# Patient Record
Sex: Female | Born: 1965
Health system: Southern US, Community
[De-identification: ages and names within clinical notes are randomized; demographics above are authoritative.]

## PROBLEM LIST (undated history)

## (undated) DIAGNOSIS — B20 Human immunodeficiency virus [HIV] disease: Secondary | ICD-10-CM

## (undated) DIAGNOSIS — D61818 Other pancytopenia: Secondary | ICD-10-CM

## (undated) DIAGNOSIS — T300 Burn of unspecified body region, unspecified degree: Secondary | ICD-10-CM

## (undated) DIAGNOSIS — R112 Nausea with vomiting, unspecified: Secondary | ICD-10-CM

## (undated) DIAGNOSIS — C539 Malignant neoplasm of cervix uteri, unspecified: Secondary | ICD-10-CM

## (undated) DIAGNOSIS — Z8614 Personal history of Methicillin resistant Staphylococcus aureus infection: Secondary | ICD-10-CM

## (undated) DIAGNOSIS — Z95828 Presence of other vascular implants and grafts: Secondary | ICD-10-CM

## (undated) DIAGNOSIS — E119 Type 2 diabetes mellitus without complications: Secondary | ICD-10-CM

## (undated) DIAGNOSIS — G893 Neoplasm related pain (acute) (chronic): Secondary | ICD-10-CM

## (undated) DIAGNOSIS — R197 Diarrhea, unspecified: Secondary | ICD-10-CM

## (undated) DIAGNOSIS — T451X5A Adverse effect of antineoplastic and immunosuppressive drugs, initial encounter: Secondary | ICD-10-CM

## (undated) DIAGNOSIS — Z973 Presence of spectacles and contact lenses: Secondary | ICD-10-CM

## (undated) DIAGNOSIS — E039 Hypothyroidism, unspecified: Secondary | ICD-10-CM

## (undated) DIAGNOSIS — Z872 Personal history of diseases of the skin and subcutaneous tissue: Secondary | ICD-10-CM

## (undated) DIAGNOSIS — D696 Thrombocytopenia, unspecified: Secondary | ICD-10-CM

## (undated) DIAGNOSIS — R350 Frequency of micturition: Secondary | ICD-10-CM

## (undated) DIAGNOSIS — E038 Other specified hypothyroidism: Secondary | ICD-10-CM

## (undated) DIAGNOSIS — Z21 Asymptomatic human immunodeficiency virus [HIV] infection status: Secondary | ICD-10-CM

## (undated) DIAGNOSIS — R351 Nocturia: Secondary | ICD-10-CM

## (undated) DIAGNOSIS — Z923 Personal history of irradiation: Secondary | ICD-10-CM

## (undated) DIAGNOSIS — D649 Anemia, unspecified: Secondary | ICD-10-CM

## (undated) HISTORY — PX: TUBAL LIGATION: SHX77

## (undated) HISTORY — PX: OTHER SURGICAL HISTORY: SHX169

## (undated) HISTORY — PX: CYSTOSCOPY: SUR368

---

## 1999-03-26 ENCOUNTER — Emergency Department (HOSPITAL_COMMUNITY): Admission: EM | Admit: 1999-03-26 | Discharge: 1999-03-26 | Payer: Self-pay | Admitting: Emergency Medicine

## 1999-03-26 ENCOUNTER — Encounter: Payer: Self-pay | Admitting: Emergency Medicine

## 2000-09-28 ENCOUNTER — Emergency Department (HOSPITAL_COMMUNITY): Admission: EM | Admit: 2000-09-28 | Discharge: 2000-09-28 | Payer: Self-pay | Admitting: *Deleted

## 2000-11-08 ENCOUNTER — Emergency Department (HOSPITAL_COMMUNITY): Admission: EM | Admit: 2000-11-08 | Discharge: 2000-11-08 | Payer: Self-pay | Admitting: Emergency Medicine

## 2002-06-11 ENCOUNTER — Emergency Department (HOSPITAL_COMMUNITY): Admission: EM | Admit: 2002-06-11 | Discharge: 2002-06-12 | Payer: Self-pay | Admitting: Emergency Medicine

## 2002-07-08 ENCOUNTER — Emergency Department (HOSPITAL_COMMUNITY): Admission: EM | Admit: 2002-07-08 | Discharge: 2002-07-08 | Payer: Self-pay | Admitting: Emergency Medicine

## 2005-01-08 ENCOUNTER — Emergency Department (HOSPITAL_COMMUNITY): Admission: EM | Admit: 2005-01-08 | Discharge: 2005-01-08 | Payer: Self-pay | Admitting: *Deleted

## 2005-03-01 ENCOUNTER — Emergency Department (HOSPITAL_COMMUNITY): Admission: EM | Admit: 2005-03-01 | Discharge: 2005-03-01 | Payer: Self-pay | Admitting: Family Medicine

## 2005-05-30 ENCOUNTER — Ambulatory Visit: Payer: Self-pay | Admitting: Internal Medicine

## 2005-05-31 ENCOUNTER — Ambulatory Visit: Payer: Self-pay | Admitting: *Deleted

## 2005-07-16 ENCOUNTER — Emergency Department (HOSPITAL_COMMUNITY): Admission: EM | Admit: 2005-07-16 | Discharge: 2005-07-16 | Payer: Self-pay | Admitting: Family Medicine

## 2005-11-15 ENCOUNTER — Emergency Department (HOSPITAL_COMMUNITY): Admission: EM | Admit: 2005-11-15 | Discharge: 2005-11-15 | Payer: Self-pay | Admitting: Family Medicine

## 2006-04-24 ENCOUNTER — Emergency Department (HOSPITAL_COMMUNITY): Admission: EM | Admit: 2006-04-24 | Discharge: 2006-04-24 | Payer: Self-pay | Admitting: Family Medicine

## 2006-08-05 ENCOUNTER — Emergency Department (HOSPITAL_COMMUNITY): Admission: EM | Admit: 2006-08-05 | Discharge: 2006-08-05 | Payer: Self-pay | Admitting: Emergency Medicine

## 2006-08-07 ENCOUNTER — Emergency Department (HOSPITAL_COMMUNITY): Admission: EM | Admit: 2006-08-07 | Discharge: 2006-08-07 | Payer: Self-pay | Admitting: Emergency Medicine

## 2008-01-27 ENCOUNTER — Emergency Department (HOSPITAL_COMMUNITY): Admission: EM | Admit: 2008-01-27 | Discharge: 2008-01-28 | Payer: Self-pay | Admitting: Emergency Medicine

## 2009-02-06 ENCOUNTER — Emergency Department (HOSPITAL_COMMUNITY): Admission: EM | Admit: 2009-02-06 | Discharge: 2009-02-07 | Payer: Self-pay | Admitting: Emergency Medicine

## 2009-03-02 ENCOUNTER — Emergency Department (HOSPITAL_COMMUNITY): Admission: EM | Admit: 2009-03-02 | Discharge: 2009-03-02 | Payer: Self-pay | Admitting: Emergency Medicine

## 2010-01-05 ENCOUNTER — Emergency Department (HOSPITAL_COMMUNITY): Admission: EM | Admit: 2010-01-05 | Discharge: 2010-01-05 | Payer: Self-pay | Admitting: Family Medicine

## 2010-01-25 ENCOUNTER — Emergency Department (HOSPITAL_COMMUNITY): Admission: EM | Admit: 2010-01-25 | Discharge: 2010-01-25 | Payer: Self-pay | Admitting: Family Medicine

## 2010-09-27 ENCOUNTER — Emergency Department (HOSPITAL_COMMUNITY): Admission: EM | Admit: 2010-09-27 | Discharge: 2010-09-27 | Payer: Self-pay | Admitting: Emergency Medicine

## 2011-01-18 LAB — POCT URINALYSIS DIPSTICK
Bilirubin Urine: NEGATIVE
Glucose, UA: NEGATIVE mg/dL
Ketones, ur: NEGATIVE mg/dL
Nitrite: NEGATIVE
Protein, ur: NEGATIVE mg/dL
Specific Gravity, Urine: 1.02 (ref 1.005–1.030)
Urobilinogen, UA: 1 mg/dL (ref 0.0–1.0)
pH: 6.5 (ref 5.0–8.0)

## 2011-01-18 LAB — POCT PREGNANCY, URINE: Preg Test, Ur: NEGATIVE

## 2011-02-05 ENCOUNTER — Inpatient Hospital Stay (INDEPENDENT_AMBULATORY_CARE_PROVIDER_SITE_OTHER)
Admission: RE | Admit: 2011-02-05 | Discharge: 2011-02-05 | Disposition: A | Discharge status: Home / Self Care | Payer: Self-pay | Source: Ambulatory Visit | Attending: Emergency Medicine | Admitting: Emergency Medicine

## 2011-02-05 DIAGNOSIS — T6391XA Toxic effect of contact with unspecified venomous animal, accidental (unintentional), initial encounter: Secondary | ICD-10-CM

## 2011-02-16 LAB — URINE MICROSCOPIC-ADD ON

## 2011-02-16 LAB — URINALYSIS, ROUTINE W REFLEX MICROSCOPIC
Bilirubin Urine: NEGATIVE
Glucose, UA: NEGATIVE mg/dL
Ketones, ur: NEGATIVE mg/dL
Nitrite: POSITIVE — AB
Protein, ur: 100 mg/dL — AB
Specific Gravity, Urine: 1.018 (ref 1.005–1.030)
Urobilinogen, UA: 1 mg/dL (ref 0.0–1.0)
pH: 6.5 (ref 5.0–8.0)

## 2011-02-16 LAB — POCT PREGNANCY, URINE: Preg Test, Ur: NEGATIVE

## 2011-03-14 ENCOUNTER — Inpatient Hospital Stay (INDEPENDENT_AMBULATORY_CARE_PROVIDER_SITE_OTHER)
Admission: RE | Admit: 2011-03-14 | Discharge: 2011-03-14 | Disposition: A | Discharge status: Home / Self Care | Payer: Self-pay | Source: Ambulatory Visit | Attending: Emergency Medicine | Admitting: Emergency Medicine

## 2011-03-14 DIAGNOSIS — L02419 Cutaneous abscess of limb, unspecified: Secondary | ICD-10-CM

## 2011-03-16 LAB — CULTURE, ROUTINE-ABSCESS: Gram Stain: NONE SEEN

## 2011-08-01 LAB — URINALYSIS, ROUTINE W REFLEX MICROSCOPIC
Bilirubin Urine: NEGATIVE
Glucose, UA: NEGATIVE
Hgb urine dipstick: NEGATIVE
Ketones, ur: 15 — AB
Leukocytes, UA: NEGATIVE
Protein, ur: 30 — AB
pH: 6

## 2011-08-01 LAB — GC/CHLAMYDIA PROBE AMP, GENITAL: GC Probe Amp, Genital: NEGATIVE

## 2011-08-01 LAB — WET PREP, GENITAL: Yeast Wet Prep HPF POC: NONE SEEN

## 2011-08-01 LAB — POCT PREGNANCY, URINE: Preg Test, Ur: NEGATIVE

## 2011-08-01 LAB — URINE MICROSCOPIC-ADD ON

## 2012-04-11 ENCOUNTER — Emergency Department (INDEPENDENT_AMBULATORY_CARE_PROVIDER_SITE_OTHER)
Admission: EM | Admit: 2012-04-11 | Discharge: 2012-04-11 | Disposition: A | Discharge status: Home / Self Care | Payer: Self-pay | Source: Home / Self Care | Attending: Emergency Medicine | Admitting: Emergency Medicine

## 2012-04-11 ENCOUNTER — Encounter (HOSPITAL_COMMUNITY): Payer: Self-pay | Admitting: Emergency Medicine

## 2012-04-11 DIAGNOSIS — N898 Other specified noninflammatory disorders of vagina: Secondary | ICD-10-CM

## 2012-04-11 DIAGNOSIS — J329 Chronic sinusitis, unspecified: Secondary | ICD-10-CM

## 2012-04-11 LAB — POCT URINALYSIS DIP (DEVICE)
Ketones, ur: 15 mg/dL — AB
Specific Gravity, Urine: >=1.03 (ref 1.005–1.030)
pH: 5.5 (ref 5.0–8.0)

## 2012-04-11 LAB — WET PREP, GENITAL: Trich, Wet Prep: NONE SEEN

## 2012-04-11 LAB — POCT PREGNANCY, URINE: Preg Test, Ur: NEGATIVE

## 2012-04-11 MED ORDER — METRONIDAZOLE 500 MG PO TABS: 500.0000 mg | ORAL_TABLET | Freq: Two times a day (BID) | ORAL | Status: AC

## 2012-04-11 MED ORDER — FLUCONAZOLE 200 MG PO TABS: 200.0000 mg | ORAL_TABLET | Freq: Once | ORAL | Status: AC

## 2012-04-11 NOTE — ED Provider Notes (Signed)
History     CSN: [id number]  Arrival date & time 04/11/12  1554   First MD Initiated Contact with Patient 04/11/12 1609      Chief Complaint  Patient presents with  . Vaginal Discharge  . Sinusitis    (Consider location/radiation/quality/duration/timing/severity/associated sxs/prior treatment) HPI Comments: Presents hasn't been having some discomfort in her lower abdomen with quite abundant discharge with an odor this started about a week ago. Patient denies any genitalia rashes or blisters. Patient denies any fevers, and denies any urinary symptoms.  Patient describes that for about a month she's been having sinus congestion sore throat and discomfort with a runny and congested nose. She's been using Claritin and A. over-the-counter nasal spray.  Patient is a 46 y.o. female presenting with vaginal discharge and sinusitis. The history is provided by the patient.  Vaginal Discharge This is a new problem. The current episode started more than 1 week ago. The problem occurs constantly. Pertinent negatives include no abdominal pain, no headaches and no shortness of breath.  Sinusitis  Associated symptoms include congestion and sinus pressure. Pertinent negatives include no chills and no shortness of breath.    Past Medical History  Diagnosis Date  . Sinusitis   . MRSA infection     History reviewed. No pertinent past surgical history.  No family history on file.  History  Substance Use Topics  . Smoking status: Never Smoker   . Smokeless tobacco: Not on file  . Alcohol Use: No    OB History    Grav Para Term Preterm Abortions TAB SAB Ect Mult Living                  Review of Systems  Constitutional: Negative for chills, diaphoresis, activity change and appetite change.  HENT: Positive for congestion, rhinorrhea, postnasal drip and sinus pressure. Negative for facial swelling, neck pain and neck stiffness.   Eyes: Negative for photophobia, discharge and redness.    Respiratory: Negative for shortness of breath.   Gastrointestinal: Negative for abdominal pain.  Genitourinary: Positive for vaginal discharge. Negative for dysuria, flank pain and vaginal pain.  Skin: Negative for rash.  Neurological: Negative for headaches.    Allergies  Review of patient's allergies indicates no known allergies.  Home Medications   Current Outpatient Rx  Name Route Sig Dispense Refill  . LORATADINE 10 MG PO TABS Oral Take 10 mg by mouth daily.    Marland Kitchen FLUCONAZOLE 200 MG PO TABS Oral Take 1 tablet (200 mg total) by mouth once. 1 tablet 0  . METRONIDAZOLE 500 MG PO TABS Oral Take 1 tablet (500 mg total) by mouth 2 (two) times daily. 14 tablet 0    BP 128/65  Pulse 78  Temp(Src) 98.6 F (37 C) (Oral)  Resp 16  SpO2 100%  LMP 03/28/2012  Physical Exam  Nursing note and vitals reviewed. Constitutional: She appears well-developed and well-nourished.  HENT:  Head: Normocephalic.  Right Ear: Tympanic membrane normal.  Left Ear: Tympanic membrane normal.  Mouth/Throat: Uvula is midline and oropharynx is clear and moist.  Eyes: Conjunctivae are normal.  Neck: Neck supple. No JVD present.  Cardiovascular: Normal rate.   Pulmonary/Chest: Effort normal.  Abdominal: She exhibits no distension and no mass. There is no tenderness. There is no rebound and no guarding.  Genitourinary: Vaginal discharge found.  Musculoskeletal: Normal range of motion.  Lymphadenopathy:    She has no cervical adenopathy.  Neurological: She is alert.    ED Course  Procedures (including critical care time)  Labs Reviewed  POCT URINALYSIS DIP (DEVICE) - Abnormal; Notable for the following:    Bilirubin Urine SMALL (*)    Ketones, ur 15 (*)    Hgb urine dipstick MODERATE (*)    Protein, ur 100 (*)    Leukocytes, UA SMALL (*) Biochemical Testing Only. Please order routine urinalysis from main lab if confirmatory testing is needed.   All other components within normal limits  WET  PREP, GENITAL - Abnormal; Notable for the following:    Yeast Wet Prep HPF POC FEW (*)    Clue Cells Wet Prep HPF POC FEW (*)    WBC, Wet Prep HPF POC MANY (*)    All other components within normal limits  POCT PREGNANCY, URINE  GC/CHLAMYDIA PROBE AMP, GENITAL   No results found.   1. Sinusitis   2. Vaginal Discharge       MDM  Patient presents to urgent care with 2 complaints ongoing sinusitis -type symptoms and a vaginal discharge. Her a. Patient was examined screened for STDs. Her to continue with Claritin-D instead of plain Claritin and prescribed Flagyl and Diflucan. Patient agree with for empirical treatment prior wet prep results.      Jimmie Molly, MD 04/11/12 [phone]

## 2012-04-11 NOTE — Discharge Instructions (Signed)
  We will contact you if abnormal test results will require of further treatment   Sinusitis Sinuses are air pockets within the bones of your face. The growth of bacteria within a sinus leads to infection. The infection prevents the sinuses from draining. This infection is called sinusitis. SYMPTOMS  There will be different areas of pain depending on which sinuses have become infected.  The maxillary sinuses often produce pain beneath the eyes.   Frontal sinusitis may cause pain in the middle of the forehead and above the eyes.  Other problems (symptoms) include:  Toothaches.   Colored, pus-like (purulent) drainage from the nose.   Swelling, warmth, and tenderness over the sinus areas may be signs of infection.  TREATMENT  Sinusitis is most often determined by an exam.X-rays may be taken. If x-rays have been taken, make sure you obtain your results or find out how you are to obtain them. Your caregiver may give you medications (antibiotics). These are medications that will help kill the bacteria causing the infection. You may also be given a medication (decongestant) that helps to reduce sinus swelling.  HOME CARE INSTRUCTIONS   Only take over-the-counter or prescription medicines for pain, discomfort, or fever as directed by your caregiver.   Drink extra fluids. Fluids help thin the mucus so your sinuses can drain more easily.   Applying either moist heat or ice packs to the sinus areas may help relieve discomfort.   Use saline nasal sprays to help moisten your sinuses. The sprays can be found at your local drugstore.  SEEK IMMEDIATE MEDICAL CARE IF:  You have a fever.   You have increasing pain, severe headaches, or toothache.   You have nausea, vomiting, or drowsiness.   You develop unusual swelling around the face or trouble seeing.  MAKE SURE YOU:   Understand these instructions.   Will watch your condition.   Will get help right away if you are not doing well or get  worse.  Document Released: 10/24/2005 Document Revised: 10/13/2011 Document Reviewed: 05/23/2007 [facility] Patient Information 2012 [place], [place].

## 2012-04-11 NOTE — ED Notes (Signed)
Pt here with c/o lower abdominal pain and white,clumpy vag d/c with odor that started x 1 week ago and sinus infection ongoing x 1 mnth.cough with white phlegm,sore throat,throb h/a and stuffy nose unrelieved by otc Claritin d and nasal spray

## 2012-04-12 LAB — GC/CHLAMYDIA PROBE AMP, GENITAL: Chlamydia, DNA Probe: NEGATIVE

## 2012-04-12 NOTE — ED Notes (Signed)
GC/Chlamydia neg., Wet prep: few yeast, few clue cells, many WBC's. Pt. adequately treated with Diflucan and Flagyl. Vassie Moselle 04/12/2012

## 2012-12-06 ENCOUNTER — Encounter (HOSPITAL_COMMUNITY): Payer: Self-pay | Admitting: Neurology

## 2012-12-06 ENCOUNTER — Emergency Department (HOSPITAL_COMMUNITY)
Admission: EM | Admit: 2012-12-06 | Discharge: 2012-12-06 | Disposition: A | Discharge status: Home / Self Care | Payer: Self-pay | Attending: Emergency Medicine | Admitting: Emergency Medicine

## 2012-12-06 DIAGNOSIS — Z8614 Personal history of Methicillin resistant Staphylococcus aureus infection: Secondary | ICD-10-CM | POA: Insufficient documentation

## 2012-12-06 DIAGNOSIS — Z8709 Personal history of other diseases of the respiratory system: Secondary | ICD-10-CM | POA: Insufficient documentation

## 2012-12-06 DIAGNOSIS — N390 Urinary tract infection, site not specified: Secondary | ICD-10-CM

## 2012-12-06 LAB — POCT I-STAT, CHEM 8
BUN: 10 mg/dL (ref 6–23)
Calcium, Ion: 1.15 mmol/L (ref 1.12–1.23)
Chloride: 104 mEq/L (ref 96–112)
Creatinine, Ser: 0.9 mg/dL (ref 0.50–1.10)
Glucose, Bld: 104 mg/dL — ABNORMAL HIGH (ref 70–99)

## 2012-12-06 LAB — URINALYSIS, ROUTINE W REFLEX MICROSCOPIC
Bilirubin Urine: NEGATIVE
Glucose, UA: NEGATIVE mg/dL
Ketones, ur: NEGATIVE mg/dL
Protein, ur: >300 mg/dL — AB
pH: 6.5 (ref 5.0–8.0)

## 2012-12-06 LAB — WET PREP, GENITAL: WBC, Wet Prep HPF POC: NONE SEEN

## 2012-12-06 LAB — URINE MICROSCOPIC-ADD ON

## 2012-12-06 MED ORDER — CEPHALEXIN 500 MG PO CAPS: 500.0000 mg | ORAL_CAPSULE | Freq: Four times a day (QID) | ORAL | Status: DC

## 2012-12-06 NOTE — ED Notes (Signed)
Pt states hematuria since this morning with clots of blood. Denies pain. Pt ambulatory in NAD

## 2012-12-06 NOTE — ED Provider Notes (Signed)
Medical screening examination/treatment/procedure(s) were performed by non-physician practitioner and as supervising physician I was immediately available for consultation/collaboration.   Chey Rachels, MD 12/06/12 1552 

## 2012-12-06 NOTE — ED Provider Notes (Signed)
History     CSN: [id number]  Arrival date & time 12/06/12  0711   First MD Initiated Contact with Patient 12/06/12 [phone]      Chief Complaint  Patient presents with  . Hematuria    (Consider location/radiation/quality/duration/timing/severity/associated sxs/prior treatment) HPI Comments: This is a 47 year old female, who presents emergency department with chief complaint of hematuria. Patient states that her symptoms began this morning. She denies any dysuria, urgency, or frequency. She states her last menstrual period was 11/21/2012. She denies fever, nausea, vomiting, and abdominal pain. She has not tried anything to alleviate her symptoms. She has not experienced this in the past. Nothing makes her symptoms better or worse.  The history is provided by the patient. No language interpreter was used.    Past Medical History  Diagnosis Date  . Sinusitis   . MRSA infection     History reviewed. No pertinent past surgical history.  No family history on file.  History  Substance Use Topics  . Smoking status: Never Smoker   . Smokeless tobacco: Not on file  . Alcohol Use: No    OB History    Grav Para Term Preterm Abortions TAB SAB Ect Mult Living                  Review of Systems  All other systems reviewed and are negative.    Allergies  Review of patient's allergies indicates no known allergies.  Home Medications   Current Outpatient Rx  Name  Route  Sig  Dispense  Refill  . OVER THE COUNTER MEDICATION   Nasal   Place 1 spray into the nose 2 (two) times daily as needed. Nasal spray         . LORATADINE 10 MG PO TABS   Oral   Take 10 mg by mouth daily as needed. Seasonal allergies         . OXYCODONE-ACETAMINOPHEN 5-325 MG PO TABS   Oral   Take 1 tablet by mouth every 6 (six) hours as needed. pain           BP 135/94  Pulse 86  Temp 97.4 F (36.3 C) (Oral)  Resp 18  Ht 5\' 1"  (1.549 m)  Wt 200 lb (90.719 kg)  BMI 37.79 kg/m2  SpO2 99%   LMP 11/21/2012  Physical Exam  Nursing note and vitals reviewed. Constitutional: She is oriented to person, place, and time. She appears well-developed and well-nourished.  HENT:  Head: Normocephalic and atraumatic.  Eyes: Conjunctivae normal and EOM are normal. Pupils are equal, round, and reactive to light.  Neck: Normal range of motion. Neck supple.  Cardiovascular: Normal rate and regular rhythm.  Exam reveals no gallop and no friction rub.   No murmur heard. Pulmonary/Chest: Effort normal and breath sounds normal. No respiratory distress. She has no wheezes. She has no rales. She exhibits no tenderness.  Abdominal: Soft. Bowel sounds are normal. She exhibits no distension and no mass. There is no tenderness. There is no rebound and no guarding. Hernia confirmed negative in the right inguinal area and confirmed negative in the left inguinal area.  Genitourinary: No labial fusion. There is no rash, tenderness, lesion or injury on the right labia. There is no rash, tenderness, lesion or injury on the left labia. Uterus is not deviated, not enlarged, not fixed and not tender. Cervix exhibits no motion tenderness, no discharge and no friability. Right adnexum displays no mass, no tenderness and no fullness. Left  adnexum displays no mass, no tenderness and no fullness. No erythema, tenderness or bleeding around the vagina. No foreign body around the vagina. No signs of injury around the vagina. No vaginal discharge found.  Musculoskeletal: Normal range of motion. She exhibits no edema and no tenderness.  Lymphadenopathy:       Right: No inguinal adenopathy present.       Left: No inguinal adenopathy present.  Neurological: She is alert and oriented to person, place, and time.  Skin: Skin is warm and dry.  Psychiatric: She has a normal mood and affect. Her behavior is normal. Judgment and thought content normal.    ED Course  Procedures (including critical care time)   Labs Reviewed   URINALYSIS, ROUTINE W REFLEX MICROSCOPIC  WET PREP, GENITAL  GC/CHLAMYDIA PROBE AMP   Results for orders placed during the hospital encounter of 12/06/12  URINALYSIS, ROUTINE W REFLEX MICROSCOPIC      Component Value Range   Color, Urine RED (*) YELLOW   APPearance TURBID (*) CLEAR   Specific Gravity, Urine 1.024  1.005 - 1.030   pH 6.5  5.0 - 8.0   Glucose, UA NEGATIVE  NEGATIVE mg/dL   Hgb urine dipstick LARGE (*) NEGATIVE   Bilirubin Urine NEGATIVE  NEGATIVE   Ketones, ur NEGATIVE  NEGATIVE mg/dL   Protein, ur >161 (*) NEGATIVE mg/dL   Urobilinogen, UA 1.0  0.0 - 1.0 mg/dL   Nitrite NEGATIVE  NEGATIVE   Leukocytes, UA SMALL (*) NEGATIVE  WET PREP, GENITAL      Component Value Range   Yeast Wet Prep HPF POC NONE SEEN  NONE SEEN   Trich, Wet Prep NONE SEEN  NONE SEEN   Clue Cells Wet Prep HPF POC FEW (*) NONE SEEN   WBC, Wet Prep HPF POC NONE SEEN  NONE SEEN  URINE MICROSCOPIC-ADD ON      Component Value Range   Squamous Epithelial / LPF FEW (*) RARE   WBC, UA 21-50  <3 WBC/hpf   RBC / HPF TOO NUMEROUS TO COUNT  <3 RBC/hpf  HEMOGLOBIN      Component Value Range   Hemoglobin 12.0  12.0 - 15.0 g/dL  POCT I-STAT, CHEM 8      Component Value Range   Sodium 141  135 - 145 mEq/L   Potassium 3.8  3.5 - 5.1 mEq/L   Chloride 104  96 - 112 mEq/L   BUN 10  6 - 23 mg/dL   Creatinine, Ser 0.96  0.50 - 1.10 mg/dL   Glucose, Bld 045 (*) 70 - 99 mg/dL   Calcium, Ion 4.09  8.11 - 1.23 mmol/L   TCO2 27  0 - 100 mmol/L   Hemoglobin 12.9  12.0 - 15.0 g/dL   HCT 91.4  78.2 - 95.6 %      1. UTI (lower urinary tract infection)       MDM  This is a 47 year old female with hematuria.  I believe the patient's symptoms to likely be due to a UTI due to the amount of WBCs in the urine.  I have discussed the patient with Dr. Ranae Palms, and we have decided to order a hemoglobin and creatinine to have a point of reference if needed.  Will give the patient urology follow-up if the symptoms  persist.        Roxy Horseman, PA-C 12/06/12 0920

## 2012-12-07 LAB — GC/CHLAMYDIA PROBE AMP: GC Probe RNA: NEGATIVE

## 2012-12-26 ENCOUNTER — Emergency Department (HOSPITAL_COMMUNITY)
Admission: EM | Admit: 2012-12-26 | Discharge: 2012-12-26 | Disposition: A | Discharge status: Home / Self Care | Payer: Self-pay | Attending: Emergency Medicine | Admitting: Emergency Medicine

## 2012-12-26 ENCOUNTER — Encounter (HOSPITAL_COMMUNITY): Payer: Self-pay | Admitting: *Deleted

## 2012-12-26 DIAGNOSIS — N949 Unspecified condition associated with female genital organs and menstrual cycle: Secondary | ICD-10-CM | POA: Insufficient documentation

## 2012-12-26 DIAGNOSIS — R82998 Other abnormal findings in urine: Secondary | ICD-10-CM | POA: Insufficient documentation

## 2012-12-26 DIAGNOSIS — R6883 Chills (without fever): Secondary | ICD-10-CM | POA: Insufficient documentation

## 2012-12-26 DIAGNOSIS — R300 Dysuria: Secondary | ICD-10-CM | POA: Insufficient documentation

## 2012-12-26 DIAGNOSIS — Z87440 Personal history of urinary (tract) infections: Secondary | ICD-10-CM | POA: Insufficient documentation

## 2012-12-26 DIAGNOSIS — Z8709 Personal history of other diseases of the respiratory system: Secondary | ICD-10-CM | POA: Insufficient documentation

## 2012-12-26 DIAGNOSIS — Z8614 Personal history of Methicillin resistant Staphylococcus aureus infection: Secondary | ICD-10-CM | POA: Insufficient documentation

## 2012-12-26 DIAGNOSIS — J329 Chronic sinusitis, unspecified: Secondary | ICD-10-CM | POA: Insufficient documentation

## 2012-12-26 DIAGNOSIS — R109 Unspecified abdominal pain: Secondary | ICD-10-CM | POA: Insufficient documentation

## 2012-12-26 LAB — URINE MICROSCOPIC-ADD ON

## 2012-12-26 LAB — URINALYSIS, ROUTINE W REFLEX MICROSCOPIC
Glucose, UA: NEGATIVE mg/dL
Ketones, ur: NEGATIVE mg/dL
Protein, ur: 100 mg/dL — AB
Urobilinogen, UA: 1 mg/dL (ref 0.0–1.0)

## 2012-12-26 MED ORDER — CEPHALEXIN 500 MG PO CAPS: 500.0000 mg | ORAL_CAPSULE | Freq: Four times a day (QID) | ORAL | Status: DC

## 2012-12-26 MED ORDER — PSEUDOEPHEDRINE HCL 60 MG PO TABS: ORAL_TABLET | ORAL | Status: DC

## 2012-12-26 MED ORDER — PHENAZOPYRIDINE HCL 100 MG PO TABS: 100.0000 mg | ORAL_TABLET | Freq: Three times a day (TID) | ORAL | Status: DC | PRN

## 2012-12-26 NOTE — ED Notes (Addendum)
Pt tx here last month for a UTI.  She only finished half of her antibiotics and lost them.  Last night pt began experiencing urinary frequency, suprapubic and bil flank pain.  Pt also thinks she has a sinus infection.

## 2012-12-26 NOTE — ED Provider Notes (Signed)
History  This chart was scribed for non-physician practitioner working with Flint Melter, MD by Ardeen Jourdain, ED Scribe. This patient was seen in room TR09C/TR09C and the patient's care was started at 1633.  CSN: [id number]  Arrival date & time 12/26/12  1605   First MD Initiated Contact with Patient 12/26/12 1633      Chief Complaint  Patient presents with  . Urinary Tract Infection     Patient is a 47 y.o. female presenting with urinary tract infection. The history is provided by the patient. No language interpreter was used.  Urinary Tract Infection This is a recurrent problem. The current episode started yesterday. The problem occurs constantly. The problem has been gradually worsening. Pertinent negatives include no chest pain, no abdominal pain, no headaches and no shortness of breath. Nothing aggravates the symptoms. Nothing relieves the symptoms. She has tried nothing for the symptoms.    Misty Thomas is a 47 y.o. female who presents to the Emergency Department complaining of urinary frequency with associated chills, dysuria, suprapubic and bilateral flank pain. She denies any emesis, diarrhea and hematuria as associated symptoms. She denies any trauma or recent surgery to the area. She states she "feels like she has a fever" but did not measure it at home. She states she was diagnosed 1 month ago with a UTI. She states she had hematuria at that time. She states she lost her antibiotics after only taking half of them. She is also complaining of congestion and sinus HA.    Past Medical History  Diagnosis Date  . Sinusitis   . MRSA infection     History reviewed. No pertinent past surgical history.  No family history on file.  History  Substance Use Topics  . Smoking status: Never Smoker   . Smokeless tobacco: Not on file  . Alcohol Use: No   No OB history available.  Review of Systems  Constitutional: Positive for chills. Negative for fever.  Respiratory:  Negative for shortness of breath.   Cardiovascular: Negative for chest pain.  Gastrointestinal: Negative for nausea, vomiting, abdominal pain and diarrhea.  Genitourinary: Positive for dysuria, frequency, flank pain and pelvic pain. Negative for hematuria.  Musculoskeletal: Negative for back pain.  Neurological: Negative for headaches.  All other systems reviewed and are negative.    Allergies  Review of patient's allergies indicates no known allergies.  Home Medications   Current Outpatient Rx  Name  Route  Sig  Dispense  Refill  . Acetaminophen (TYLENOL PO)   Oral   Take 2 tablets by mouth every 6 (six) hours as needed (for pain).         Marland Kitchen loratadine (CLARITIN) 10 MG tablet   Oral   Take 10 mg by mouth daily as needed. Seasonal allergies         . OVER THE COUNTER MEDICATION   Nasal   Place 1 spray into the nose 2 (two) times daily as needed. Nasal spray           Triage Vitals: BP 137/90  Pulse 94  Temp(Src) 99.2 F (37.3 C) (Oral)  Resp 16  SpO2 99%  LMP 11/21/2012  Physical Exam  Nursing note and vitals reviewed. Constitutional: She is oriented to person, place, and time. She appears well-developed and well-nourished. No distress.  HENT:  Head: Normocephalic and atraumatic.  Nasal congestion present.  Eyes: EOM are normal. Pupils are equal, round, and reactive to light.  Neck: Normal range of motion. Neck supple.  No tracheal deviation present.  Cardiovascular: Normal rate and normal heart sounds.   Mildly tachycardic, HR 100 upon exam   Pulmonary/Chest: Effort normal and breath sounds normal. No respiratory distress. She has no wheezes. She has no rales. She exhibits no tenderness.  Abdominal: Soft. Bowel sounds are normal. She exhibits no distension and no mass. There is tenderness. There is no rebound and no guarding.  Pain in lower mid abdomen to palpation, no pain to change of position, no pain with walking, no CVA tenderness  Musculoskeletal:  Normal range of motion. She exhibits no edema.  Neurological: She is alert and oriented to person, place, and time. Coordination normal.  Skin: Skin is warm and dry. She is not diaphoretic.  Psychiatric: She has a normal mood and affect. Her behavior is normal.    ED Course  Procedures (including critical care time)  DIAGNOSTIC STUDIES: Oxygen Saturation is 99% on room air, normal by my interpretation.    COORDINATION OF CARE:  5:20 PM: Discussed treatment plan which includes UA and urine culture with pt at bedside and pt agreed to plan.    Results for orders placed during the hospital encounter of 12/26/12  URINALYSIS, ROUTINE W REFLEX MICROSCOPIC      Result Value Range   Color, Urine AMBER (*) YELLOW   APPearance CLOUDY (*) CLEAR   Specific Gravity, Urine 1.027  1.005 - 1.030   pH 6.0  5.0 - 8.0   Glucose, UA NEGATIVE  NEGATIVE mg/dL   Hgb urine dipstick SMALL (*) NEGATIVE   Bilirubin Urine SMALL (*) NEGATIVE   Ketones, ur NEGATIVE  NEGATIVE mg/dL   Protein, ur 478 (*) NEGATIVE mg/dL   Urobilinogen, UA 1.0  0.0 - 1.0 mg/dL   Nitrite NEGATIVE  NEGATIVE   Leukocytes, UA NEGATIVE  NEGATIVE  URINE MICROSCOPIC-ADD ON      Result Value Range   Squamous Epithelial / LPF MANY (*) RARE   WBC, UA 0-2  <3 WBC/hpf   RBC / HPF 3-6  <3 RBC/hpf   Bacteria, UA FEW (*) RARE   Urine-Other MUCOUS PRESENT      No results found.   No diagnosis found.    MDM  I have reviewed nursing notes, vital signs, and all appropriate lab and imaging results for this patient. Patient states she was in the emergency department on January 30 4 urinary tract infection. The patient states she only took half of her antibiotic before she lost the bottle. She presents today because she is" going to the bathroom more frequently than usual." She also has some lower abdomen discomfort at times. There's been no nausea or vomiting. Patient states she has not measured any high fever, but at times has had a few  chills.  Urine analysis shows a cloudy specimen with a specific gravity of 1.027, is a small amount of hemoglobin by dip stick. Microscopic shows many epithelial cells and few bacteria. Urine culture sent to lab.  I offered the patient reexamination and additional testing, but she states she has a twin that is on a feeding tube and she was returned home to take care of one of her twins. The plan at this time is for the patient to be treated with Keflex 4 times daily, iridium 3 times daily with food. Sudafed for her sinus congestion. Patient advised to see her primary physician or return to the emergency department for additional evaluation and management.       Kathie Dike, [place] 12/26/12 [phone]

## 2012-12-27 LAB — URINE CULTURE: Colony Count: 9000

## 2012-12-27 NOTE — ED Provider Notes (Signed)
Medical screening examination/treatment/procedure(s) were performed by non-physician practitioner and as supervising physician I was immediately available for consultation/collaboration.  Flint Melter, MD 12/27/12 [phone]

## 2013-01-19 ENCOUNTER — Emergency Department (HOSPITAL_COMMUNITY)
Admission: EM | Admit: 2013-01-19 | Discharge: 2013-01-19 | Disposition: A | Discharge status: Home / Self Care | Payer: Self-pay | Attending: Emergency Medicine | Admitting: Emergency Medicine

## 2013-01-19 ENCOUNTER — Encounter (HOSPITAL_COMMUNITY): Payer: Self-pay | Admitting: Nurse Practitioner

## 2013-01-19 DIAGNOSIS — Z3202 Encounter for pregnancy test, result negative: Secondary | ICD-10-CM | POA: Insufficient documentation

## 2013-01-19 DIAGNOSIS — F17200 Nicotine dependence, unspecified, uncomplicated: Secondary | ICD-10-CM | POA: Insufficient documentation

## 2013-01-19 DIAGNOSIS — A088 Other specified intestinal infections: Secondary | ICD-10-CM | POA: Insufficient documentation

## 2013-01-19 DIAGNOSIS — Z8709 Personal history of other diseases of the respiratory system: Secondary | ICD-10-CM | POA: Insufficient documentation

## 2013-01-19 DIAGNOSIS — R197 Diarrhea, unspecified: Secondary | ICD-10-CM | POA: Insufficient documentation

## 2013-01-19 DIAGNOSIS — E669 Obesity, unspecified: Secondary | ICD-10-CM | POA: Insufficient documentation

## 2013-01-19 DIAGNOSIS — Z8614 Personal history of Methicillin resistant Staphylococcus aureus infection: Secondary | ICD-10-CM | POA: Insufficient documentation

## 2013-01-19 LAB — LIPASE, BLOOD: Lipase: 29 U/L (ref 11–59)

## 2013-01-19 LAB — CBC WITH DIFFERENTIAL/PLATELET
HCT: 38.1 % (ref 36.0–46.0)
Hemoglobin: 13.7 g/dL (ref 12.0–15.0)
Lymphocytes Relative: 28 % (ref 12–46)
Lymphs Abs: 2.1 10*3/uL (ref 0.7–4.0)
Monocytes Absolute: 0.6 10*3/uL (ref 0.1–1.0)
Monocytes Relative: 8 % (ref 3–12)
Neutro Abs: 4.9 10*3/uL (ref 1.7–7.7)
Neutrophils Relative %: 64 % (ref 43–77)
RBC: 4.37 MIL/uL (ref 3.87–5.11)

## 2013-01-19 LAB — COMPREHENSIVE METABOLIC PANEL
CO2: 24 mEq/L (ref 19–32)
Calcium: 9.5 mg/dL (ref 8.4–10.5)
Creatinine, Ser: 0.72 mg/dL (ref 0.50–1.10)
GFR calc Af Amer: >90 mL/min (ref >90)
GFR calc non Af Amer: >90 mL/min (ref >90)
Glucose, Bld: 126 mg/dL — ABNORMAL HIGH (ref 70–99)
Total Protein: 10.4 g/dL — ABNORMAL HIGH (ref 6.0–8.3)

## 2013-01-19 LAB — URINALYSIS, MICROSCOPIC ONLY
Glucose, UA: NEGATIVE mg/dL
Ketones, ur: 15 mg/dL — AB
Leukocytes, UA: NEGATIVE
Protein, ur: >300 mg/dL — AB
pH: 5.5 (ref 5.0–8.0)

## 2013-01-19 MED ORDER — ONDANSETRON 4 MG PO TBDP
8.0000 mg | ORAL_TABLET | Freq: Once | ORAL | Status: AC
  Administered 2013-01-19: 8 mg via ORAL

## 2013-01-19 MED ORDER — ONDANSETRON HCL 4 MG PO TABS: 4.0000 mg | ORAL_TABLET | Freq: Four times a day (QID) | ORAL | Status: DC

## 2013-01-19 MED ORDER — POTASSIUM CHLORIDE CRYS ER 20 MEQ PO TBCR
40.0000 meq | EXTENDED_RELEASE_TABLET | Freq: Once | ORAL | Status: AC
  Administered 2013-01-19: 40 meq via ORAL
  Filled 2013-01-19: qty 2

## 2013-01-19 MED ORDER — ONDANSETRON 4 MG PO TBDP
ORAL_TABLET | ORAL | Status: AC
  Filled 2013-01-19: qty 2

## 2013-01-19 MED ORDER — MORPHINE SULFATE 4 MG/ML IJ SOLN: 4.0000 mg | Freq: Once | INTRAMUSCULAR | Status: DC

## 2013-01-19 MED ORDER — ONDANSETRON HCL 4 MG/2ML IJ SOLN: 4.0000 mg | Freq: Once | INTRAMUSCULAR | Status: DC

## 2013-01-19 MED ORDER — SODIUM CHLORIDE 0.9 % IV BOLUS (SEPSIS): 1000.0000 mL | Freq: Once | INTRAVENOUS | Status: DC

## 2013-01-19 NOTE — ED Provider Notes (Signed)
History     CSN: [id number]  Arrival date & time 01/19/13  1305   First MD Initiated Contact with Patient 01/19/13 1421      Chief Complaint  Patient presents with  . Emesis  . Diarrhea    (Consider location/radiation/quality/duration/timing/severity/associated sxs/prior treatment) HPI Comments: Patient presents for nausea, vomiting, and diarrhea x2 days. Patient states she has approximately 3 episodes of vomiting and diarrhea since symptom onset yesterday, both of which were nonbloody. Patient denies aggravating or alleviating factors of her symptoms however states that she believes her symptoms have started to resolve. Patient denies fevers, vision changes, chest pain, shortness of breath, abdominal pain, and urinary symptoms. Patient admits to sick contacts as both of her grandchildren have been sick with a viral gastroenteritis-like illness. He should state she has been able to tolerate by mouth food and fluids today.  The history is provided by the patient. No language interpreter was used.    Past Medical History  Diagnosis Date  . Sinusitis   . MRSA infection     No past surgical history on file.  No family history on file.  History  Substance Use Topics  . Smoking status: Current Every Day Smoker  . Smokeless tobacco: Not on file  . Alcohol Use: Yes    OB History   Grav Para Term Preterm Abortions TAB SAB Ect Mult Living                  Review of Systems  Constitutional: Negative for fever and chills.  Eyes: Negative for visual disturbance.  Respiratory: Negative for chest tightness and shortness of breath.   Cardiovascular: Negative for chest pain.  Gastrointestinal: Positive for nausea, vomiting and diarrhea. Negative for abdominal pain.  Genitourinary: Negative for dysuria and hematuria.  Musculoskeletal: Negative for back pain.  Skin: Negative for color change.  Neurological: Negative for syncope, weakness and numbness.  All other systems reviewed  and are negative.    Allergies  Review of patient's allergies indicates no known allergies.  Home Medications   Current Outpatient Rx  Name  Route  Sig  Dispense  Refill  . Acetaminophen (TYLENOL PO)   Oral   Take 2 tablets by mouth every 6 (six) hours as needed (pain).          Marland Kitchen loratadine (CLARITIN) 10 MG tablet   Oral   Take 10 mg by mouth daily as needed. Seasonal allergies         . OVER THE COUNTER MEDICATION   Nasal   Place 1 spray into the nose 2 (two) times daily as needed. Nasal spray         . phenazopyridine (PYRIDIUM) 100 MG tablet   Oral   Take 100 mg by mouth 3 (three) times daily as needed for pain.         Marland Kitchen ondansetron (ZOFRAN) 4 MG tablet   Oral   Take 1 tablet (4 mg total) by mouth every 6 (six) hours.   10 tablet   0     BP 119/67  Pulse 113  Temp(Src) 99.2 F (37.3 C) (Oral)  Resp 16  SpO2 100%  Physical Exam  Nursing note and vitals reviewed. Constitutional: She is oriented to person, place, and time. No distress.  Obese female  HENT:  Head: Normocephalic and atraumatic.  Mouth/Throat: Oropharynx is clear and moist. No oropharyngeal exudate.  Eyes: Conjunctivae are normal. No scleral icterus.  Neck: Normal range of motion. Neck supple.  Cardiovascular:  Normal rate, regular rhythm, normal heart sounds and intact distal pulses.   No murmur heard. Patient no longer tachycardic as was recorded in triage  Pulmonary/Chest: Effort normal and breath sounds normal. No respiratory distress. She has no wheezes. She has no rales.  Abdominal: Soft. Bowel sounds are normal. She exhibits no distension and no mass. There is no tenderness. There is no rebound and no guarding.  Musculoskeletal: Normal range of motion. She exhibits no edema.  Neurological: She is alert and oriented to person, place, and time.  Skin: Skin is warm and dry. She is not diaphoretic. No erythema.  Psychiatric: She has a normal mood and affect. Her behavior is normal.     ED Course  Procedures (including critical care time)  Labs Reviewed  URINALYSIS, MICROSCOPIC ONLY - Abnormal; Notable for the following:    Color, Urine AMBER (*)    APPearance CLOUDY (*)    Specific Gravity, Urine 1.035 (*)    Hgb urine dipstick SMALL (*)    Bilirubin Urine SMALL (*)    Ketones, ur 15 (*)    Protein, ur >300 (*)    Squamous Epithelial / LPF MANY (*)    Casts HYALINE CASTS (*)    All other components within normal limits  COMPREHENSIVE METABOLIC PANEL - Abnormal; Notable for the following:    Sodium 134 (*)    Potassium 3.0 (*)    Glucose, Bld 126 (*)    Total Protein 10.4 (*)    All other components within normal limits  CBC WITH DIFFERENTIAL - Abnormal; Notable for the following:    Platelets 470 (*)    All other components within normal limits  LIPASE, BLOOD  POCT PREGNANCY, URINE   No results found.   1. Viral gastroenteritis      MDM  Patient presents for nausea, vomiting, diarrhea x2 days which she states is resolving. Workup significant for hypokalemia; potassium replacement ordered. Patient's workup shows no evidence of leukocytosis, anemia, abnormal kidney or liver function, or pancreatitis. Patient's urinalysis contaminated however no evidence of urinary tract infection. She'll be treated with PO fluid hydration as well as Zofran ODT for nausea.  Patient tolerating by mouth fluids and states her nausea has improved since receiving Zofran. Patient will be discharged with PCP followup and has been provided the resource guide to use if needed. Patient states comfort and understanding with this discharge plan and requests a note for work; indications for ED return discussed. Patient work up and ED management discussed with Dr. Manus Gunning prior to d/c.  Filed Vitals:   01/19/13 1320 01/19/13 1608  BP: 157/100 119/67  Pulse: 113   Temp: 99.2 F (37.3 C)   TempSrc: Oral   Resp: 16 16  SpO2: 95% 100%       Antony Madura, PA-C 01/19/13 1621

## 2013-01-19 NOTE — ED Notes (Signed)
Per pt tolerating PO fluids well.

## 2013-01-19 NOTE — ED Notes (Signed)
Pt c/o n/v/d onset this am. Reports her grandchildren had stomach bug and thinks she caught it

## 2013-01-19 NOTE — ED Provider Notes (Signed)
Medical screening examination/treatment/procedure(s) were performed by non-physician practitioner and as supervising physician I was immediately available for consultation/collaboration.   Glynn Octave, MD 01/19/13 [phone]

## 2013-06-14 ENCOUNTER — Emergency Department (HOSPITAL_COMMUNITY)
Admission: EM | Admit: 2013-06-14 | Discharge: 2013-06-15 | Disposition: A | Discharge status: Home / Self Care | Payer: Self-pay | Attending: Emergency Medicine | Admitting: Emergency Medicine

## 2013-06-14 ENCOUNTER — Encounter (HOSPITAL_COMMUNITY): Payer: Self-pay | Admitting: *Deleted

## 2013-06-14 DIAGNOSIS — R509 Fever, unspecified: Secondary | ICD-10-CM | POA: Insufficient documentation

## 2013-06-14 DIAGNOSIS — F17200 Nicotine dependence, unspecified, uncomplicated: Secondary | ICD-10-CM | POA: Insufficient documentation

## 2013-06-14 DIAGNOSIS — Z8614 Personal history of Methicillin resistant Staphylococcus aureus infection: Secondary | ICD-10-CM | POA: Insufficient documentation

## 2013-06-14 DIAGNOSIS — J3489 Other specified disorders of nose and nasal sinuses: Secondary | ICD-10-CM | POA: Insufficient documentation

## 2013-06-14 DIAGNOSIS — J329 Chronic sinusitis, unspecified: Secondary | ICD-10-CM | POA: Insufficient documentation

## 2013-06-14 MED ORDER — DIPHENHYDRAMINE HCL 50 MG/ML IJ SOLN
25.0000 mg | Freq: Once | INTRAMUSCULAR | Status: DC
  Filled 2013-06-14: qty 1

## 2013-06-14 MED ORDER — ACETAMINOPHEN 325 MG PO TABS
325.0000 mg | ORAL_TABLET | Freq: Once | ORAL | Status: AC
Start: 2013-06-14 — End: 2013-06-14
  Administered 2013-06-14: 325 mg via ORAL
  Filled 2013-06-14: qty 1

## 2013-06-14 MED ORDER — DEXTROSE 5 % IV SOLN
1.0000 g | Freq: Once | INTRAVENOUS | Status: AC
  Administered 2013-06-15: 1 g via INTRAVENOUS
  Filled 2013-06-14: qty 10

## 2013-06-14 MED ORDER — IBUPROFEN 400 MG PO TABS
400.0000 mg | ORAL_TABLET | Freq: Once | ORAL | Status: AC
  Administered 2013-06-14: 400 mg via ORAL
  Filled 2013-06-14: qty 1

## 2013-06-14 MED ORDER — OXYCODONE-ACETAMINOPHEN 5-325 MG PO TABS
1.0000 | ORAL_TABLET | Freq: Once | ORAL | Status: AC
  Administered 2013-06-14: 1 via ORAL
  Filled 2013-06-14: qty 1

## 2013-06-14 MED ORDER — DEXAMETHASONE SODIUM PHOSPHATE 4 MG/ML IJ SOLN
10.0000 mg | Freq: Once | INTRAMUSCULAR | Status: DC
  Filled 2013-06-14: qty 1

## 2013-06-14 MED ORDER — SODIUM CHLORIDE 0.9 % IV BOLUS (SEPSIS)
1000.0000 mL | Freq: Once | INTRAVENOUS | Status: AC
  Administered 2013-06-15: 1000 mL via INTRAVENOUS

## 2013-06-14 MED ORDER — METOCLOPRAMIDE HCL 5 MG/ML IJ SOLN
10.0000 mg | Freq: Once | INTRAMUSCULAR | Status: DC
  Filled 2013-06-14: qty 2

## 2013-06-14 NOTE — ED Notes (Addendum)
C/o HA x4d, not sure when fever started, took ibuprofen today at 1400, denies nv. Also reports nasal congestion, bilateral earache and mild sore throat, throat red and irritated with some swelling, no dyspnea, pt tearful, given mask and pt holding ice pack to R head. "thinks it might be related to sinus infection, h/o same. Alert, NAD, calm, interactive.  meds given per protocol for pain and fever.

## 2013-06-14 NOTE — ED Provider Notes (Signed)
CSN: [id number]     Arrival date & time 06/14/13  2027 History     First MD Initiated Contact with Patient 06/14/13 2336     Chief Complaint  Patient presents with  . Headache   (Consider location/radiation/quality/duration/timing/severity/associated sxs/prior Treatment) HPI History provided by patient. Presenting with 4 days of congestion, sinus pressure and subjective fevers at home. She was recently exposed to her grandchildren who have fevers and runny nose and she believes that she develops sinusitis after that.  No nausea or vomiting. Tonight she has gradual onset severe headache, mostly frontal. No neck stiffness. No rash. No sore throat. No runny nose. No purulent drainage. No weakness or numbness. No difficulty with vision. Has a history of sinusitis has been trying Robitussin and saline nasal spray at home with no relief of her symptoms. Symptoms moderate in severity. Past Medical History  Diagnosis Date  . Sinusitis   . MRSA infection    History reviewed. No pertinent past surgical history. No family history on file. History  Substance Use Topics  . Smoking status: Current Every Day Smoker  . Smokeless tobacco: Not on file  . Alcohol Use: Yes   OB History   Grav Para Term Preterm Abortions TAB SAB Ect Mult Living                 Review of Systems  Constitutional: Positive for fever. Negative for diaphoresis.  HENT: Positive for congestion. Negative for sore throat, trouble swallowing, neck stiffness and voice change.   Eyes: Negative for photophobia and visual disturbance.  Respiratory: Negative for shortness of breath.   Cardiovascular: Negative for chest pain.  Gastrointestinal: Negative for vomiting and abdominal pain.  Genitourinary: Negative for dysuria.  Musculoskeletal: Negative for back pain.  Skin: Negative for rash.  Neurological: Positive for headaches. Negative for weakness and numbness.  All other systems reviewed and are negative.    Allergies   Review of patient's allergies indicates no known allergies.  Home Medications   Current Outpatient Rx  Name  Route  Sig  Dispense  Refill  . aspirin-acetaminophen-caffeine (EXCEDRIN EXTRA STRENGTH) 250-250-65 MG per tablet   Oral   Take 2 tablets by mouth every 6 (six) hours as needed for pain.         Marland Kitchen guaiFENesin (ROBITUSSIN) 100 MG/5ML liquid   Oral   Take 600 mg by mouth daily as needed for cough.         Marland Kitchen ibuprofen (ADVIL,MOTRIN) 200 MG tablet   Oral   Take 1,200 mg by mouth once.         . loratadine (CLARITIN) 10 MG tablet   Oral   Take 10 mg by mouth daily as needed. Seasonal allergies          BP 136/81  Pulse 105  Temp(Src) 102.3 F (39.1 C) (Oral)  Resp 20  SpO2 93% Physical Exam  Nursing note and vitals reviewed. Constitutional: She is oriented to person, place, and time. She appears well-developed and well-nourished.  HENT:  Head: Normocephalic and atraumatic.  Mild maxillary tenderness without swelling. Nasal congestion present. Uvula midline without posterior pharynx exudates.  Eyes: Conjunctivae and EOM are normal. Pupils are equal, round, and reactive to light. No scleral icterus.  Neck: Neck supple.  Cardiovascular: Regular rhythm and intact distal pulses.   Borderline tachycardic  Pulmonary/Chest: Effort normal and breath sounds normal. No respiratory distress.  Abdominal: Soft. She exhibits no distension. There is no tenderness.  Musculoskeletal: Normal range of motion.  She exhibits no edema.  Lymphadenopathy:    She has no cervical adenopathy.  Neurological: She is alert and oriented to person, place, and time.  Skin: Skin is warm and dry.    ED Course   Procedures (including critical care time)  Results for orders placed during the hospital encounter of 06/14/13  GLUCOSE, CAPILLARY      Result Value Range   Glucose-Capillary 101 (*) 70 - 99 mg/dL   Comment 1 Notify RN     Comment 2 Documented in Chart     IVFs and IV ABx. I  offered HA medications and at 1:12 AM she states her HA is now gone and declines. Normal neuro exam, no indication for emergent imaging or LP. Plan Rx for sinusitis, continue nasal saline and motrin ans needed.  strict return precautions verbalized as understood.    MDM  Clinical Sinusitis  CBG WNL  IVFs and medications  VS and nurses notes reviewed/ considered  Sunnie Nielsen, MD 06/15/13 [phone]

## 2013-06-14 NOTE — ED Notes (Signed)
The pt has had a headache for 4 days no nv or diarrhea.  She has had a cold she has had ibu  Today.  Crying in triage

## 2013-06-15 MED ORDER — IBUPROFEN 800 MG PO TABS: 800.0000 mg | ORAL_TABLET | Freq: Three times a day (TID) | ORAL | Status: DC

## 2013-06-15 MED ORDER — AMOXICILLIN 500 MG PO CAPS: 500.0000 mg | ORAL_CAPSULE | Freq: Three times a day (TID) | ORAL | Status: DC

## 2013-06-15 MED ORDER — SALINE SPRAY 0.65 % NA SOLN: 1.0000 | NASAL | Status: DC | PRN

## 2013-06-18 ENCOUNTER — Encounter (HOSPITAL_COMMUNITY): Payer: Self-pay | Admitting: *Deleted

## 2013-06-18 DIAGNOSIS — F17200 Nicotine dependence, unspecified, uncomplicated: Secondary | ICD-10-CM | POA: Insufficient documentation

## 2013-06-18 DIAGNOSIS — Z79899 Other long term (current) drug therapy: Secondary | ICD-10-CM | POA: Insufficient documentation

## 2013-06-18 DIAGNOSIS — J323 Chronic sphenoidal sinusitis: Secondary | ICD-10-CM | POA: Insufficient documentation

## 2013-06-18 DIAGNOSIS — R51 Headache: Secondary | ICD-10-CM | POA: Insufficient documentation

## 2013-06-18 DIAGNOSIS — R61 Generalized hyperhidrosis: Secondary | ICD-10-CM | POA: Insufficient documentation

## 2013-06-18 DIAGNOSIS — L989 Disorder of the skin and subcutaneous tissue, unspecified: Secondary | ICD-10-CM | POA: Insufficient documentation

## 2013-06-18 DIAGNOSIS — Z8614 Personal history of Methicillin resistant Staphylococcus aureus infection: Secondary | ICD-10-CM | POA: Insufficient documentation

## 2013-06-18 DIAGNOSIS — J322 Chronic ethmoidal sinusitis: Secondary | ICD-10-CM | POA: Insufficient documentation

## 2013-06-18 DIAGNOSIS — R6883 Chills (without fever): Secondary | ICD-10-CM | POA: Insufficient documentation

## 2013-06-18 NOTE — ED Notes (Signed)
Headache for one week.  She was seen here on Sunday for the same and diagnosed with a sinus ionfection

## 2013-06-19 ENCOUNTER — Encounter (HOSPITAL_COMMUNITY): Payer: Self-pay | Admitting: Radiology

## 2013-06-19 ENCOUNTER — Emergency Department (HOSPITAL_COMMUNITY): Payer: Self-pay

## 2013-06-19 ENCOUNTER — Emergency Department (HOSPITAL_COMMUNITY)
Admission: EM | Admit: 2013-06-19 | Discharge: 2013-06-19 | Disposition: A | Discharge status: Home / Self Care | Payer: Self-pay | Attending: Emergency Medicine | Admitting: Emergency Medicine

## 2013-06-19 DIAGNOSIS — J322 Chronic ethmoidal sinusitis: Secondary | ICD-10-CM

## 2013-06-19 DIAGNOSIS — J323 Chronic sphenoidal sinusitis: Secondary | ICD-10-CM

## 2013-06-19 MED ORDER — DIPHENHYDRAMINE HCL 50 MG/ML IJ SOLN
25.0000 mg | Freq: Once | INTRAMUSCULAR | Status: AC
  Administered 2013-06-19: 25 mg via INTRAVENOUS
  Filled 2013-06-19: qty 1

## 2013-06-19 MED ORDER — SODIUM CHLORIDE 0.9 % IV BOLUS (SEPSIS)
1000.0000 mL | Freq: Once | INTRAVENOUS | Status: AC
  Administered 2013-06-19: 1000 mL via INTRAVENOUS

## 2013-06-19 MED ORDER — AMOXICILLIN-POT CLAVULANATE 875-125 MG PO TABS: 1.0000 | ORAL_TABLET | Freq: Two times a day (BID) | ORAL | Status: DC

## 2013-06-19 MED ORDER — METOCLOPRAMIDE HCL 5 MG/ML IJ SOLN
10.0000 mg | Freq: Once | INTRAMUSCULAR | Status: AC
  Administered 2013-06-19: 10 mg via INTRAVENOUS
  Filled 2013-06-19: qty 2

## 2013-06-19 MED ORDER — CETIRIZINE-PSEUDOEPHEDRINE ER 5-120 MG PO TB12: 1.0000 | ORAL_TABLET | Freq: Two times a day (BID) | ORAL | Status: DC

## 2013-06-19 MED ORDER — FLUTICASONE PROPIONATE 50 MCG/ACT NA SUSP: 2.0000 | Freq: Every day | NASAL | Status: DC

## 2013-06-19 MED ORDER — DEXAMETHASONE SODIUM PHOSPHATE 10 MG/ML IJ SOLN
10.0000 mg | Freq: Once | INTRAMUSCULAR | Status: AC
  Administered 2013-06-19: 10 mg via INTRAVENOUS
  Filled 2013-06-19: qty 1

## 2013-06-19 MED ORDER — KETOROLAC TROMETHAMINE 30 MG/ML IJ SOLN
30.0000 mg | Freq: Once | INTRAMUSCULAR | Status: AC
  Administered 2013-06-19: 30 mg via INTRAVENOUS
  Filled 2013-06-19: qty 1

## 2013-06-19 NOTE — ED Notes (Signed)
Pt transported to CT ?

## 2013-06-19 NOTE — ED Notes (Signed)
Pt has ride home.

## 2013-06-19 NOTE — ED Provider Notes (Signed)
CSN: [id number]     Arrival date & time 06/18/13  2352 History     First MD Initiated Contact with Patient 06/19/13 0235     Chief Complaint  Patient presents with  . Headache   HPI  History provided by the patient and recent medical chart. The patient is a 47 year old female who returns with continued recurrent right-sided headache and pressure. Patient was seen for some her symptoms over a week ago and treated for sinusitis. She reports having some improvements after taking amoxicillin for 7 days. Patient is also using prescription strength ibuprofen 800 mg and states this relieves her headache symptoms for 4-5 hours. Her headache however returns. She does not feel congested as she did earlier. Her cough has improved. She denies any recent fevers but does report occasional sweats. No chills. Denies any confusion, vision change, speech change, weakness or numbness in extremities. No other aggravating or alleviating factors. No other associated symptoms.     Past Medical History  Diagnosis Date  . Sinusitis   . MRSA infection    History reviewed. No pertinent past surgical history. No family history on file. History  Substance Use Topics  . Smoking status: Current Every Day Smoker  . Smokeless tobacco: Not on file  . Alcohol Use: Yes   OB History   Grav Para Term Preterm Abortions TAB SAB Ect Mult Living                 Review of Systems  Constitutional: Positive for chills. Negative for fever and unexpected weight change.  HENT: Negative for congestion, rhinorrhea and sinus pressure.   Eyes: Negative for visual disturbance.  Respiratory: Negative for cough.   Gastrointestinal: Negative for nausea and vomiting.  Neurological: Positive for headaches. Negative for dizziness and light-headedness.  All other systems reviewed and are negative.    Allergies  Review of patient's allergies indicates no known allergies.  Home Medications   Current Outpatient Rx  Name  Route   Sig  Dispense  Refill  . amoxicillin (AMOXIL) 500 MG capsule   Oral   Take 1 capsule (500 mg total) by mouth 3 (three) times daily.   21 capsule   0   . aspirin-acetaminophen-caffeine (EXCEDRIN EXTRA STRENGTH) 250-250-65 MG per tablet   Oral   Take 2 tablets by mouth every 6 (six) hours as needed for pain.         Marland Kitchen guaiFENesin (ROBITUSSIN) 100 MG/5ML liquid   Oral   Take 600 mg by mouth daily as needed for cough.         Marland Kitchen ibuprofen (ADVIL,MOTRIN) 800 MG tablet   Oral   Take 1 tablet (800 mg total) by mouth 3 (three) times daily.   21 tablet   0   . loratadine (CLARITIN) 10 MG tablet   Oral   Take 10 mg by mouth daily as needed. Seasonal allergies         . sodium chloride (OCEAN) 0.65 % SOLN nasal spray   Nasal   Place 1 spray into the nose as needed for congestion.   1 Bottle   0    BP 123/78  Pulse 58  Temp(Src) 98.3 F (36.8 C)  Resp 16  SpO2 97% Physical Exam  Nursing note and vitals reviewed. Constitutional: She is oriented to person, place, and time. She appears well-developed and well-nourished. No distress.  HENT:  Head: Normocephalic and atraumatic.  Eyes: Conjunctivae and EOM are normal. Pupils are equal, round, and reactive  to light.  Neck: Normal range of motion. Neck supple.  No meningeal signs  Cardiovascular: Normal rate and regular rhythm.   No murmur heard. Pulmonary/Chest: Effort normal and breath sounds normal. No respiratory distress. She has no wheezes. She has no rales.  Abdominal: Soft. There is no tenderness. There is no rigidity, no rebound, no guarding, no CVA tenderness and no tenderness at McBurney's point.  4-5 cm nontender mobile nodule that appears to be within the subcutaneous fat of the left upper abdomen area. No definite splenomegaly. No skin changes or erythema.  Neurological: She is alert and oriented to person, place, and time. She has normal strength. No cranial nerve deficit or sensory deficit. Gait normal.  Skin:  Skin is warm and dry. No rash noted.  Psychiatric: She has a normal mood and affect. Her behavior is normal.    ED Course   Procedures     Ct Head Wo Contrast  06/19/2013   *RADIOLOGY REPORT*  Clinical Data: Headache.  CT HEAD WITHOUT CONTRAST  Technique:  Contiguous axial images were obtained from the base of the skull through the vertex without contrast.  Comparison: No priors.  Findings: No acute intracranial abnormalities.  Specifically, no evidence of acute intracranial hemorrhage, no definite findings of acute/subacute cerebral ischemia, no mass, mass effect, hydrocephalus or abnormal intra or extra-axial fluid collections. Mastoids are well pneumatized bilaterally.  Paranasal sinuses are remarkable for multifocal mucosal thickening throughout the ethmoid sinuses and sphenoid sinuses bilaterally, including near complete opacification of the right sphenoid sinus.  No acute displaced skull fractures are identified.  IMPRESSION: 1.  No acute intracranial abnormalities. 2.  The appearance of the brain is normal. 3.  Paranasal sinus disease, as above.   Original Report Authenticated By: Trudie Reed, M.D.      1. Ethmoid sinusitis   2. Sphenoid sinusitis   3. Sinus headache       MDM  3:30 AM patient seen and evaluated. His resting appears comfortable in no acute distress. She has a normal nonfocal neuro exam. She does report having some improvement of symptoms and intensity of headaches after being treated for sinusitis. Headache does go away for approximately 4-5 hours after taking ibuprofen. No other concerning findings.   Patient later on mentions some concerns for a "knot that she had felt on the skin of her left abdomen. On palpation she does appear to have 4-5 cm mobile subcutaneous type nodule likely within the fat. Patient is obese in body habitus does make exam slightly difficult. Does not feel to be solid organ or splenomegaly. There is no tenderness. No change of the skin  or erythema. At this time we'll recommend patient followup with her PCP for further evaluation of this with imaging. No indications for emergent imaging at this time.  Angus Seller, PA-C 06/19/13 1037

## 2013-06-19 NOTE — ED Notes (Signed)
Pt has had h/a x2 weeks. Pt was recently dx with sinus infection. Pt states pain is in right side of head and in the left neck area. Pt denies n/v. Pt is sensitive to light. Pt has taken nyquil and motrin 800mg  PTA. Pt resting comfortably in room. NAD

## 2013-06-27 NOTE — ED Provider Notes (Signed)
Medical screening examination/treatment/procedure(s) were performed by non-physician practitioner and as supervising physician I was immediately available for consultation/collaboration.  Vallen Calabrese K Arely Tinner-Rasch, MD 06/27/13 2335 

## 2014-04-21 ENCOUNTER — Emergency Department (HOSPITAL_COMMUNITY): Payer: Self-pay

## 2014-04-21 ENCOUNTER — Emergency Department (HOSPITAL_COMMUNITY)
Admission: EM | Admit: 2014-04-21 | Discharge: 2014-04-21 | Disposition: A | Discharge status: Home / Self Care | Payer: Self-pay | Attending: Emergency Medicine | Admitting: Emergency Medicine

## 2014-04-21 ENCOUNTER — Encounter (HOSPITAL_COMMUNITY): Payer: Self-pay | Admitting: Emergency Medicine

## 2014-04-21 DIAGNOSIS — Z23 Encounter for immunization: Secondary | ICD-10-CM | POA: Insufficient documentation

## 2014-04-21 DIAGNOSIS — Y9389 Activity, other specified: Secondary | ICD-10-CM | POA: Insufficient documentation

## 2014-04-21 DIAGNOSIS — Y929 Unspecified place or not applicable: Secondary | ICD-10-CM | POA: Insufficient documentation

## 2014-04-21 DIAGNOSIS — Z791 Long term (current) use of non-steroidal anti-inflammatories (NSAID): Secondary | ICD-10-CM | POA: Insufficient documentation

## 2014-04-21 DIAGNOSIS — IMO0002 Reserved for concepts with insufficient information to code with codable children: Secondary | ICD-10-CM | POA: Insufficient documentation

## 2014-04-21 DIAGNOSIS — S90859A Superficial foreign body, unspecified foot, initial encounter: Secondary | ICD-10-CM

## 2014-04-21 DIAGNOSIS — W268XXA Contact with other sharp object(s), not elsewhere classified, initial encounter: Secondary | ICD-10-CM | POA: Insufficient documentation

## 2014-04-21 DIAGNOSIS — Z8709 Personal history of other diseases of the respiratory system: Secondary | ICD-10-CM | POA: Insufficient documentation

## 2014-04-21 DIAGNOSIS — F17200 Nicotine dependence, unspecified, uncomplicated: Secondary | ICD-10-CM | POA: Insufficient documentation

## 2014-04-21 DIAGNOSIS — Z8619 Personal history of other infectious and parasitic diseases: Secondary | ICD-10-CM | POA: Insufficient documentation

## 2014-04-21 MED ORDER — TETANUS-DIPHTH-ACELL PERTUSSIS 5-2.5-18.5 LF-MCG/0.5 IM SUSP
0.5000 mL | Freq: Once | INTRAMUSCULAR | Status: AC
  Administered 2014-04-21: 0.5 mL via INTRAMUSCULAR
  Filled 2014-04-21: qty 0.5

## 2014-04-21 NOTE — ED Notes (Signed)
C/o toothpick stuck in bottom of L foot since Sunday morning.  Puncture wound noted to bottom of L foot.

## 2014-04-21 NOTE — ED Notes (Signed)
Pt states she stepped on a toothpick, states it broke off and she believes a piece 1/4 in is stuck in her left foot. States it happened yesterday morning but had to work and has been walking on it.

## 2014-04-21 NOTE — ED Provider Notes (Signed)
CSN: [id number]     Arrival date & time 04/21/14  0359 History   First MD Initiated Contact with Patient 04/21/14 0559     Chief Complaint  Patient presents with  . Foreign Body     (Consider location/radiation/quality/duration/timing/severity/associated sxs/prior Treatment) HPI Comments: Patient presents to the ED with a chief complaint of foreign body in foot.  Patient states that she stepped on a toothpick Sunday morning and that it broke of in her foot.  Pain is moderate to severe.  She tried to remove it, but was unsuccessful.  She has tried using epson salts.  She states that it is worse with ambulation.  It is better with rest.  Last tetanus shot is unknown.  The history is provided by the patient. No language interpreter was used.    Past Medical History  Diagnosis Date  . Sinusitis   . MRSA infection    History reviewed. No pertinent past surgical history. No family history on file. History  Substance Use Topics  . Smoking status: Current Every Day Smoker  . Smokeless tobacco: Not on file  . Alcohol Use: Yes   OB History   Grav Para Term Preterm Abortions TAB SAB Ect Mult Living                 Review of Systems  Constitutional: Negative for fever and chills.  Respiratory: Negative for shortness of breath.   Cardiovascular: Negative for chest pain.  Gastrointestinal: Negative for nausea, vomiting, diarrhea and constipation.  Genitourinary: Negative for dysuria.  Skin: Positive for wound.      Allergies  Review of patient's allergies indicates no known allergies.  Home Medications   Prior to Admission medications   Medication Sig Start Date End Date Taking? Authorizing Provider  aspirin-acetaminophen-caffeine (EXCEDRIN EXTRA STRENGTH) [phone] MG per tablet Take 2 tablets by mouth every 6 (six) hours as needed for pain.   Yes Historical Provider, MD  cetirizine-pseudoephedrine (ZYRTEC-D) 5-120 MG per tablet Take 1 tablet by mouth daily as needed for  allergies.   Yes Historical Provider, MD  fluticasone (FLONASE) 50 MCG/ACT nasal spray Place 2 sprays into the nose daily. 06/19/13  Yes Peter S Dammen, PA-C  ibuprofen (ADVIL,MOTRIN) 200 MG tablet Take 400 mg by mouth every 6 (six) hours as needed for moderate pain.   Yes Historical Provider, MD  loratadine (CLARITIN) 10 MG tablet Take 10 mg by mouth daily as needed. Seasonal allergies   Yes Historical Provider, MD  sodium chloride (OCEAN) 0.65 % SOLN nasal spray Place 1 spray into the nose as needed for congestion. 06/15/13  Yes Teressa Lower, MD   BP 129/71  Pulse 88  Temp(Src) 98.2 F (36.8 C) (Oral)  Resp 20  Ht 5\' 1"  (1.549 m)  Wt 210 lb (95.255 kg)  BMI 39.70 kg/m2  SpO2 98%  LMP 03/22/2014 Physical Exam  Nursing note and vitals reviewed. Constitutional: She is oriented to person, place, and time. She appears well-developed and well-nourished.  HENT:  Head: Normocephalic and atraumatic.  Eyes: Conjunctivae and EOM are normal.  Neck: Normal range of motion.  Cardiovascular: Normal rate.   Pulmonary/Chest: Effort normal.  Abdominal: She exhibits no distension.  Musculoskeletal: Normal range of motion.  Neurological: She is alert and oriented to person, place, and time.  Skin: Skin is dry.  Pin-sized puncture wound on sole of left foot  Psychiatric: She has a normal mood and affect. Her behavior is normal. Judgment and thought content normal.    ED  Course  FOREIGN BODY REMOVAL Date/Time: 04/21/2014 6:42 AM Performed by: Montine Circle Authorized by: Montine Circle Consent: Verbal consent obtained. Risks and benefits: risks, benefits and alternatives were discussed Consent given by: patient Patient understanding: patient states understanding of the procedure being performed Patient consent: the patient's understanding of the procedure matches consent given Procedure consent: procedure consent matches procedure scheduled Relevant documents: relevant documents present and  verified Test results: test results available and properly labeled Site marked: the operative site was marked Imaging studies: imaging studies available Required items: required blood products, implants, devices, and special equipment available Patient identity confirmed: verbally with patient Body area: skin General location: lower extremity Location details: left foot Patient sedated: no Patient restrained: no Patient cooperative: yes Localization method: visualized Removal mechanism: 2 18 ga needles used as tweezers  Dressing: antibiotic ointment and dressing applied Tendon involvement: none Depth: subcutaneous Complexity: simple 1 objects recovered. Objects recovered: broken toothpick approx 0.5 cm Post-procedure assessment: foreign body removed Patient tolerance: Patient tolerated the procedure well with no immediate complications.   (including critical care time) Labs Review Labs Reviewed - No data to display  Imaging Review Dg Foot 2 Views Left  04/21/2014   CLINICAL DATA:  Possible toothpick in ball of foot.  EXAM: LEFT FOOT - 2 VIEW  COMPARISON:  None.  FINDINGS: There is no evidence of fracture or dislocation. There is no evidence of arthropathy or other focal bone abnormality. Moderate plantar calcaneal spur. Soft tissues are nonsuspicious, no pathologic calcifications, subcutaneous gas or radiopaque foreign bodies (wood foreign bodies typically appear as gas).  IMPRESSION: No fracture deformity, dislocation or radiopaque foreign bodies.   Electronically Signed   By: Elon Alas   On: 04/21/2014 06:21     EKG Interpretation None      MDM   Final diagnoses:  Foreign body in foot    Patient with toothpick in foot.  Removed in the ED.  Tdap updated.  Foot cleansed and antibiotic ointment applied.    Montine Circle, PA-C 04/21/14 [phone]

## 2014-04-21 NOTE — Discharge Instructions (Signed)
Sliver Removal °You have had a sliver (splinter) removed. This has caused a wound that extends through some or all layers of the skin and possibly into the subcutaneous tissue. This is the tissue just beneath the skin. Because these wounds can not be cleaned well, it is necessary to watch closely for infection. °AFTER THE PROCEDURE  °If a cut (incision) was necessary to remove this, it may have been repaired for you by your caregiver either with suturing, stapling, or adhesive strips. These keep together the skin edges and allow better and faster healing. °HOME CARE INSTRUCTIONS  °· A dressing may have been applied. This may be changed once per day or as instructed. If the dressing sticks, it may be soaked off with a gauze pad or clean cloth that has been dampened with soapy water or hydrogen peroxide. °· It is difficult to remove all slivers or foreign bodies as they may break or splinter into smaller pieces. Be aware that your body will work to remove the foreign substance. That is, the foreign body may work itself out of the wound. That is normal. °· Watch for signs of infection and notify your caregiver if you suspect a sliver or foreign body remains in the wound. °· You may have received a recommendation to follow up with your physician or a specialist. It is very important to call for or keep follow-up appointments in order to avoid infection or other complications. °· Only take over-the-counter or prescription medicines for pain, discomfort, or fever as directed by your caregiver. °· If antibiotics were prescribed, be sure to finish all of the medicine. °If you did not receive a tetanus shot today because you did not recall when your last one was given, check with your caregiver in the next day or two during follow up to determine if one is needed. °SEEK MEDICAL CARE IF:  °· The area around the wound has new or worsening redness or tenderness. °· Pus is coming from the wound °· There is a foul smell from the  wound or dressing °· The edges of a wound that had been repaired break open °SEEK IMMEDIATE MEDICAL CARE IF:  °· Red streaks are coming from the wound °· An unexplained oral temperature above 102° F (38.9° C) develops. °Document Released: 10/21/2000 Document Revised: 01/16/2012 Document Reviewed: 06/09/2008 °ExitCare® Patient Information ©2014 [employer]. ° °

## 2014-04-23 NOTE — ED Provider Notes (Signed)
Medical screening examination/treatment/procedure(s) were performed by non-physician practitioner and as supervising physician I was immediately available for consultation/collaboration.   EKG Interpretation None        Elyn Peers, MD 04/23/14 [phone]

## 2015-10-22 ENCOUNTER — Emergency Department (HOSPITAL_COMMUNITY)
Admission: EM | Admit: 2015-10-22 | Discharge: 2015-10-22 | Disposition: A | Discharge status: Home / Self Care | Payer: No Typology Code available for payment source | Source: Home / Self Care | Attending: Emergency Medicine | Admitting: Emergency Medicine

## 2015-10-22 ENCOUNTER — Other Ambulatory Visit (HOSPITAL_COMMUNITY)
Admission: RE | Admit: 2015-10-22 | Discharge: 2015-10-22 | Disposition: A | Discharge status: Home / Self Care | Payer: No Typology Code available for payment source | Source: Ambulatory Visit | Attending: Emergency Medicine | Admitting: Emergency Medicine

## 2015-10-22 ENCOUNTER — Encounter (HOSPITAL_COMMUNITY): Payer: Self-pay | Admitting: *Deleted

## 2015-10-22 DIAGNOSIS — J0100 Acute maxillary sinusitis, unspecified: Secondary | ICD-10-CM

## 2015-10-22 DIAGNOSIS — N760 Acute vaginitis: Secondary | ICD-10-CM | POA: Diagnosis not present

## 2015-10-22 DIAGNOSIS — Z113 Encounter for screening for infections with a predominantly sexual mode of transmission: Secondary | ICD-10-CM | POA: Diagnosis not present

## 2015-10-22 DIAGNOSIS — R1032 Left lower quadrant pain: Secondary | ICD-10-CM

## 2015-10-22 DIAGNOSIS — J069 Acute upper respiratory infection, unspecified: Secondary | ICD-10-CM

## 2015-10-22 LAB — POCT PREGNANCY, URINE: PREG TEST UR: NEGATIVE

## 2015-10-22 MED ORDER — IBUPROFEN 800 MG PO TABS: 800.0000 mg | ORAL_TABLET | Freq: Three times a day (TID) | ORAL | Status: DC

## 2015-10-22 MED ORDER — AMOXICILLIN-POT CLAVULANATE 875-125 MG PO TABS: 1.0000 | ORAL_TABLET | Freq: Two times a day (BID) | ORAL | Status: DC

## 2015-10-22 MED ORDER — MOMETASONE FUROATE 50 MCG/ACT NA SUSP: 2.0000 | Freq: Every day | NASAL | Status: DC

## 2015-10-22 MED ORDER — METRONIDAZOLE 500 MG PO TABS: 500.0000 mg | ORAL_TABLET | Freq: Two times a day (BID) | ORAL | Status: DC

## 2015-10-22 NOTE — ED Provider Notes (Signed)
HPI  SUBJECTIVE:  Misty Thomas is a 49 y.o. female who presents with 2 complaints: First, she reports sinus pain/pressure, thick nasal drainage for the past 2 weeks. She reports 1 week of her scratchy throat, bilateral ear pain. She reports postnasal drip, nasal congestion. She reports nonproductive cough. No change in hearing, otorrhea, foreign body insertion. No chest pain, wheezing, shortness of breath. Reports some pain with swallowing, but no sensation of throat swelling shut. She has been taking Claritin-D, ibuprofen, Excedrin, Mucinex, Afrin. Symptoms are better with Mucinex. No aggravating factors. She has been using Afrin for a week. Patient is not a smoker- states that she quit  Second, she reports 1 month of dull, left lower quadrant pain that she states lasts hours. She states it was bilateral pelvis,  now is primarily left lower quadrant. She reports vaginal discharge. No nausea, vomiting, vaginal bleeding, genital rash, vaginal odor, dyspareunia, genital itching, urinary complaints, back pain. Patient is stooling normally. She is sexually active with a female partner. This is not a monogamous relationship. They use condoms inconsistently. No aggravating or alleviating factors. She has not tried anything for this. States that the symptoms are similar when she had chlamydia. Also history of trichomonas. No history of gonorrhea, HIV, HSV, yeast, bacterial vaginosis. History of UTI, but has no urinary complaints today. Also history of bilateral tubal ligation. No abdominal surgeries. No history of PID, ectopic pregnancies.  Past Medical History  Diagnosis Date  . Sinusitis   . MRSA infection     History reviewed. No pertinent past surgical history.  History reviewed. No pertinent family history.  Social History  Substance Use Topics  . Smoking status: Former Research scientist (life sciences)  . Smokeless tobacco: None  . Alcohol Use: Yes     Comment: occasionally    No current facility-administered  medications for this encounter.  Current outpatient prescriptions:  .  aspirin-acetaminophen-caffeine (EXCEDRIN EXTRA STRENGTH) 250-250-65 MG per tablet, Take 2 tablets by mouth every 6 (six) hours as needed for pain., Disp: , Rfl:  .  guaiFENesin (MUCINEX) 600 MG 12 hr tablet, Take by mouth 2 (two) times daily., Disp: , Rfl:  .  sodium chloride (OCEAN) 0.65 % SOLN nasal spray, Place 1 spray into the nose as needed for congestion., Disp: 1 Bottle, Rfl: 0 .  [DISCONTINUED] fluticasone (FLONASE) 50 MCG/ACT nasal spray, Place 2 sprays into the nose daily., Disp: 16 g, Rfl: 2 .  [DISCONTINUED] loratadine (CLARITIN) 10 MG tablet, Take 10 mg by mouth daily as needed. Seasonal allergies, Disp: , Rfl:  .  amoxicillin-clavulanate (AUGMENTIN) 875-125 MG tablet, Take 1 tablet by mouth 2 (two) times daily. X 7 days, Disp: 14 tablet, Rfl: 0 .  ibuprofen (ADVIL,MOTRIN) 800 MG tablet, Take 1 tablet (800 mg total) by mouth 3 (three) times daily., Disp: 30 tablet, Rfl: 0 .  metroNIDAZOLE (FLAGYL) 500 MG tablet, Take 1 tablet (500 mg total) by mouth 2 (two) times daily. X 7 days, Disp: 14 tablet, Rfl: 0 .  mometasone (NASONEX) 50 MCG/ACT nasal spray, Place 2 sprays into the nose daily., Disp: 17 g, Rfl: 0  No Known Allergies   ROS  As noted in HPI.   Physical Exam  BP 155/88 mmHg  Pulse 88  Temp(Src) 98.2 F (36.8 C) (Oral)  Resp 16  SpO2 99%  LMP 08/22/2015 (Exact Date)  Constitutional: Well developed, well nourished, no acute distress Eyes: PERRL, EOMI, conjunctiva normal bilaterally HENT: Normocephalic, atraumatic,mucus membranes moist TMs normal bilaterally. Hearing grossly intact intact. Normal  oropharynx, positive postnasal drip, positive cobblestoning. No tonsillar exudates or swelling. Maxillary sinus tenderness. No frontal sinus tenderness. Erythematous, swollen turbinates. Minimal nasal congestion. Respiratory: Clear to auscultation bilaterally, no rales, no wheezing, no  rhonchi Cardiovascular: Normal rate and rhythm, no murmurs, no gallops, no rubs GI: Normal appearance, Soft, nondistended, normal bowel sounds, nontender, no rebound, no guarding. No tenderness where pt points as location of pain.  GU: External genitalia normal.  Normal vaginal mucosa.  Normal os.  Thin  nonoderous  white  vaginal discharge.   Uterus smooth, NT. NoCMT. No adnexal tenderness. No adnexal masses.  Chaperone present during exam Back: no CVAT skin: No rash, skin intact Lymph: No cervical lymphadenopathy. Musculoskeletal: No edema, no tenderness, no deformities Neurologic: Alert & oriented x 3, CN II-XII grossly intact, no motor deficits, sensation grossly intact Psychiatric: Speech and behavior appropriate   ED Course   Medications - No data to display  Orders Placed This Encounter  Procedures  . Pelvic exam    Standing Status: Standing     Number of Occurrences: 1     Standing Expiration Date:   . Pregnancy, urine POC    Standing Status: Standing     Number of Occurrences: 1     Standing Expiration Date:    Results for orders placed or performed during the hospital encounter of 10/22/15 (from the past 24 hour(s))  Pregnancy, urine POC     Status: None   Collection Time: 10/22/15  2:25 PM  Result Value Ref Range   Preg Test, Ur NEGATIVE NEGATIVE   No results found.  ED Clinical Impression  Acute maxillary sinusitis, recurrence not specified  URI (upper respiratory infection)  Left lower quadrant pain  Vaginitis   ED Assessment/Plan   Plan:  1. URI now sinus infection with Mucinex discontinue Claritin, d/c afrin, start nasal steroid,  ibuprofen, Augmentin 10 days.   2 , left lower quadrant pain. Does not appear to be a surgical abdomen, PID. Gonorrhea, chlamydia, wet prep sent. Will defer treatment for gonorrhea, chlamydia, based on lab results. Home with Flagyl 500 mg twice a day 7 days for presumed BV. Patient to use ibuprofen 800 mg 3 times a day  as needed for abdominal pain, gets worse up with OB or PMD if no better. Discussed labs, medical decision making, signs and symptoms that should prompt return to the ER. Patient agrees with plan  Handwrote prescription. Eprescribing not working, Personal assistant not working.  *This clinic note was created using Dragon dictation software. Therefore, there may be occasional mistakes despite careful proofreading.  ?  Melynda Ripple, MD 10/22/15 1530

## 2015-10-22 NOTE — ED Notes (Signed)
2 weeks of bilateral ear pain, sore throat, and  cough, productive the past 2 days   She did not check her temp at home but did feel chills at times.

## 2015-10-22 NOTE — Discharge Instructions (Signed)
And treating you for bacterial vaginosis. The Flagyl will also cover Trichomonas. Give [name] a working phone number so that we can contact you if it if necessary. We will call in the appropriate medication based on your lab results. Your partner may need treatment. Refrain from intercourse until your symptoms resolve and uterine your partner or partners have been treated appropriately.

## 2015-10-23 LAB — CERVICOVAGINAL ANCILLARY ONLY
Chlamydia: NEGATIVE
NEISSERIA GONORRHEA: NEGATIVE

## 2015-10-26 LAB — CERVICOVAGINAL ANCILLARY ONLY: Wet Prep (BD Affirm): POSITIVE — AB

## 2016-08-25 ENCOUNTER — Encounter (HOSPITAL_COMMUNITY): Payer: Self-pay

## 2016-08-25 ENCOUNTER — Emergency Department (HOSPITAL_COMMUNITY)
Admission: EM | Admit: 2016-08-25 | Discharge: 2016-08-25 | Disposition: A | Discharge status: Home / Self Care | Payer: Self-pay | Attending: Emergency Medicine | Admitting: Emergency Medicine

## 2016-08-25 ENCOUNTER — Emergency Department (HOSPITAL_COMMUNITY): Payer: Self-pay

## 2016-08-25 DIAGNOSIS — R1011 Right upper quadrant pain: Secondary | ICD-10-CM | POA: Insufficient documentation

## 2016-08-25 DIAGNOSIS — Z87891 Personal history of nicotine dependence: Secondary | ICD-10-CM | POA: Insufficient documentation

## 2016-08-25 LAB — URINALYSIS, ROUTINE W REFLEX MICROSCOPIC
Bilirubin Urine: NEGATIVE
Glucose, UA: NEGATIVE mg/dL
KETONES UR: NEGATIVE mg/dL
NITRITE: NEGATIVE
PROTEIN: NEGATIVE mg/dL
Specific Gravity, Urine: 1.02 (ref 1.005–1.030)
pH: 7.5 (ref 5.0–8.0)

## 2016-08-25 LAB — CBC WITH DIFFERENTIAL/PLATELET
Basophils Absolute: 0 10*3/uL (ref 0.0–0.1)
Basophils Relative: 0 %
EOS ABS: 0.1 10*3/uL (ref 0.0–0.7)
Eosinophils Relative: 2 %
HEMATOCRIT: 35.6 % — AB (ref 36.0–46.0)
HEMOGLOBIN: 11.6 g/dL — AB (ref 12.0–15.0)
LYMPHS ABS: 2.3 10*3/uL (ref 0.7–4.0)
Lymphocytes Relative: 33 %
MCH: 28.9 pg (ref 26.0–34.0)
MCHC: 32.6 g/dL (ref 30.0–36.0)
MCV: 88.6 fL (ref 78.0–100.0)
MONO ABS: 0.6 10*3/uL (ref 0.1–1.0)
MONOS PCT: 8 %
NEUTROS PCT: 57 %
Neutro Abs: 4.1 10*3/uL (ref 1.7–7.7)
Platelets: 428 10*3/uL — ABNORMAL HIGH (ref 150–400)
RBC: 4.02 MIL/uL (ref 3.87–5.11)
RDW: 13.1 % (ref 11.5–15.5)
WBC: 7.1 10*3/uL (ref 4.0–10.5)

## 2016-08-25 LAB — COMPREHENSIVE METABOLIC PANEL
ALK PHOS: 63 U/L (ref 38–126)
ALT: 31 U/L (ref 14–54)
ANION GAP: 5 (ref 5–15)
AST: 33 U/L (ref 15–41)
Albumin: 3.6 g/dL (ref 3.5–5.0)
BILIRUBIN TOTAL: 0.5 mg/dL (ref 0.3–1.2)
BUN: 16 mg/dL (ref 6–20)
CALCIUM: 9.2 mg/dL (ref 8.9–10.3)
CO2: 27 mmol/L (ref 22–32)
Chloride: 105 mmol/L (ref 101–111)
Creatinine, Ser: 0.73 mg/dL (ref 0.44–1.00)
GFR calc non Af Amer: >60 mL/min (ref >60)
Glucose, Bld: 123 mg/dL — ABNORMAL HIGH (ref 65–99)
POTASSIUM: 4.2 mmol/L (ref 3.5–5.1)
SODIUM: 137 mmol/L (ref 135–145)
TOTAL PROTEIN: 9.6 g/dL — AB (ref 6.5–8.1)

## 2016-08-25 LAB — URINE MICROSCOPIC-ADD ON

## 2016-08-25 LAB — POC URINE PREG, ED: PREG TEST UR: NEGATIVE

## 2016-08-25 LAB — LIPASE, BLOOD: Lipase: 30 U/L (ref 11–51)

## 2016-08-25 MED ORDER — ONDANSETRON HCL 4 MG/2ML IJ SOLN
4.0000 mg | Freq: Once | INTRAMUSCULAR | Status: AC
  Administered 2016-08-25: 4 mg via INTRAVENOUS
  Filled 2016-08-25: qty 2

## 2016-08-25 MED ORDER — HYDROCODONE-ACETAMINOPHEN 5-325 MG PO TABS: 1.0000 | ORAL_TABLET | Freq: Four times a day (QID) | ORAL | 0 refills | Status: DC | PRN

## 2016-08-25 MED ORDER — HYDROMORPHONE HCL 2 MG/ML IJ SOLN
1.0000 mg | Freq: Once | INTRAMUSCULAR | Status: AC
  Administered 2016-08-25: 1 mg via INTRAVENOUS
  Filled 2016-08-25: qty 1

## 2016-08-25 NOTE — Discharge Instructions (Signed)
You have been diagnosed with right upper quadrant pain.  You had an ultrasound to look at your liver and gallbladder.  The gallbladder looks normal, but it is recommended that you follow-up with the surgical group for a second opinion.  You liver appears to have some fatty infiltration.  This should be followed up by your primary care doctor.  Return to the ER for worsening symptoms, fever, vomiting, or uncontrolled pain.  You urine did have some bacteria in it, but as you do not have urinary symptoms we will not treat this now.  We will send your urine for culture, and will call you in an antibiotic if needed.

## 2016-08-25 NOTE — ED Triage Notes (Signed)
Pt states she has had pain on right side into back x 2 days; pt denies injury to area; pt states pain is constant and prevents her from sleeping; pt c/o pain at 6/10 on arrival. Pt a&ox 4 on arrival. Pt ambulatory.

## 2016-08-25 NOTE — ED Provider Notes (Signed)
[facility] DEPT Provider Note   CSN: [id number] Arrival date & time: 08/25/16  0411     History   Chief Complaint Chief Complaint  Patient presents with  . Flank Pain    HPI Misty Thomas is a 50 y.o. female.  Patient presents to the emergency department with chief complaint of right upper quadrant pain. She states that the symptoms started 2 days ago. They've been persistent. She reports that the pain radiates to her back. She reports some associated nausea, but denies any vomiting. She denies any fevers chills. Denies any chest pain or shortness breath. She denies any prior abdominal surgeries. Her symptoms are worsened with movement and palpation. There are no other associated symptoms. She has not tried taking anything to alleviate her symptoms. She denies any dysuria or hematuria.   The history is provided by the patient. No language interpreter was used.    Past Medical History:  Diagnosis Date  . MRSA infection   . Sinusitis     There are no active problems to display for this patient.   History reviewed. No pertinent surgical history.  OB History    No data available       Home Medications    Prior to Admission medications   Medication Sig Start Date End Date Taking? Authorizing Provider  naproxen sodium (ANAPROX) 220 MG tablet Take 220 mg by mouth 2 (two) times daily as needed (for pain).   Yes Historical Provider, MD  amoxicillin-clavulanate (AUGMENTIN) 875-125 MG tablet Take 1 tablet by mouth 2 (two) times daily. X 7 days Patient not taking: Reported on 08/25/2016 10/22/15   Melynda Ripple, MD  ibuprofen (ADVIL,MOTRIN) 800 MG tablet Take 1 tablet (800 mg total) by mouth 3 (three) times daily. Patient not taking: Reported on 08/25/2016 10/22/15   Melynda Ripple, MD  metroNIDAZOLE (FLAGYL) 500 MG tablet Take 1 tablet (500 mg total) by mouth 2 (two) times daily. X 7 days Patient not taking: Reported on 08/25/2016 10/22/15   Melynda Ripple, MD    mometasone (NASONEX) 50 MCG/ACT nasal spray Place 2 sprays into the nose daily. Patient not taking: Reported on 08/25/2016 10/22/15   Melynda Ripple, MD  sodium chloride (OCEAN) 0.65 % SOLN nasal spray Place 1 spray into the nose as needed for congestion. Patient not taking: Reported on 08/25/2016 06/15/13   Teressa Lower, MD    Family History No family history on file.  Social History Social History  Substance Use Topics  . Smoking status: Former Research scientist (life sciences)  . Smokeless tobacco: Not on file  . Alcohol use Yes     Comment: occasionally     Allergies   Review of patient's allergies indicates no known allergies.   Review of Systems Review of Systems  Gastrointestinal: Positive for abdominal pain.  All other systems reviewed and are negative.    Physical Exam Updated Vital Signs BP 108/79   Pulse 73   Temp 98.1 F (36.7 C) (Oral)   Resp 16   Ht 5\' 1"  (1.549 m)   Wt 105.5 kg   SpO2 99%   BMI 43.94 kg/m   Physical Exam  Constitutional: She is oriented to person, place, and time. She appears well-developed and well-nourished.  HENT:  Head: Normocephalic and atraumatic.  Eyes: Conjunctivae and EOM are normal. Pupils are equal, round, and reactive to light.  Neck: Normal range of motion. Neck supple.  Cardiovascular: Normal rate and regular rhythm.  Exam reveals no gallop and no friction rub.   No  murmur heard. Pulmonary/Chest: Effort normal and breath sounds normal. No respiratory distress. She has no wheezes. She has no rales. She exhibits no tenderness.  Abdominal: Soft. Bowel sounds are normal. She exhibits no distension and no mass. There is tenderness. There is no rebound and no guarding.  Right upper quadrant tender to palpation, no other focal abdominal tenderness  Musculoskeletal: Normal range of motion. She exhibits no edema or tenderness.  Neurological: She is alert and oriented to person, place, and time.  Skin: Skin is warm and dry.  Psychiatric: She has a  normal mood and affect. Her behavior is normal. Judgment and thought content normal.  Nursing note and vitals reviewed.    ED Treatments / Results  Labs (all labs ordered are listed, but only abnormal results are displayed) Labs Reviewed  URINALYSIS, ROUTINE W REFLEX MICROSCOPIC (NOT AT [facility]) - Abnormal; Notable for the following:       Result Value   Hgb urine dipstick TRACE (*)    Leukocytes, UA SMALL (*)    All other components within normal limits  URINE MICROSCOPIC-ADD ON - Abnormal; Notable for the following:    Squamous Epithelial / LPF 6-30 (*)    Bacteria, UA FEW (*)    All other components within normal limits  CBC WITH DIFFERENTIAL/PLATELET - Abnormal; Notable for the following:    Hemoglobin 11.6 (*)    HCT 35.6 (*)    Platelets 428 (*)    All other components within normal limits  COMPREHENSIVE METABOLIC PANEL - Abnormal; Notable for the following:    Glucose, Bld 123 (*)    Total Protein 9.6 (*)    All other components within normal limits  URINE CULTURE  LIPASE, BLOOD  POC URINE PREG, ED    EKG  EKG Interpretation None       Radiology US Abdomen Limited  Result Date: 08/25/2016 CLINICAL DATA:  Right upper quadrant pain. EXAM: US ABDOMEN LIMITED - RIGHT UPPER QUADRANT COMPARISON:  No recent prior. FINDINGS: Gallbladder: No gallstones or wall thickening visualized. No sonographic Murphy sign noted by sonographer. Common bile duct: Diameter: 2.1 mm Liver: Liver echotexture is heterogeneous suggesting fatty infiltration and/or hepatocellular disease. No focal hepatic abnormality identified. IMPRESSION: Liver echotexture is heterogeneous suggesting fatty infiltration and/or hepatocellular disease. No focal hepatic abnormality identified. No gallstones or biliary distention . Electronically Signed   By: Marcello Moores  Register   On: 08/25/2016 07:30    Procedures Procedures (including critical care time)  Medications Ordered in ED Medications  HYDROmorphone  (DILAUDID) injection 1 mg (1 mg Intravenous Given 08/25/16 0646)  ondansetron (ZOFRAN) injection 4 mg (4 mg Intravenous Given 08/25/16 0646)     Initial Impression / Assessment and Plan / ED Course  I have reviewed the triage vital signs and the nursing notes.  Pertinent labs & imaging results that were available during my care of the patient were reviewed by me and considered in my medical decision making (see chart for details).  Clinical Course    Patient with right upper quadrant pain 2 days. Afebrile. Not vomiting. Will check right upper quadrant ultrasound labs. We'll treat pain, and will reassess.  Old son remarkable for fatty infiltration of the liver. Recommend the patient follow-up with this with her primary care doctor. The gallbladder appears normal. No evidence of cholelithiasis or cholecystitis. Given that the location of the patient's pain is in the right upper quadrant and radiates back, will advise patient follow-up with her doctor and potentially with general surgery. Will  give patient something to treat her pain. Specific return precautions given. Patient understands agrees the plan. She is stable and ready for discharge.  Urinalysis does show small cells, she has no urinary symptoms. Will not treat at this time as asymptomatic. Will send urine for culture.  Final Clinical Impressions(s) / ED Diagnoses   Final diagnoses:  Right upper quadrant abdominal pain    New Prescriptions New Prescriptions   No medications on file     Montine Circle, PA-C 08/25/16 [id number]    Ripley Fraise, MD 08/26/16 [phone]

## 2016-08-25 NOTE — ED Notes (Signed)
Patient transported to Ultrasound 

## 2016-08-26 LAB — URINE CULTURE

## 2016-11-07 DIAGNOSIS — Z8614 Personal history of Methicillin resistant Staphylococcus aureus infection: Secondary | ICD-10-CM

## 2016-11-07 DIAGNOSIS — Z872 Personal history of diseases of the skin and subcutaneous tissue: Secondary | ICD-10-CM

## 2016-11-07 HISTORY — DX: Personal history of diseases of the skin and subcutaneous tissue: Z87.2

## 2016-11-07 HISTORY — DX: Personal history of Methicillin resistant Staphylococcus aureus infection: Z86.14

## 2017-01-04 ENCOUNTER — Ambulatory Visit (HOSPITAL_COMMUNITY)
Admission: EM | Admit: 2017-01-04 | Discharge: 2017-01-04 | Disposition: A | Discharge status: Home / Self Care | Payer: BLUE CROSS/BLUE SHIELD | Attending: Family Medicine | Admitting: Family Medicine

## 2017-01-04 ENCOUNTER — Encounter (HOSPITAL_COMMUNITY): Payer: Self-pay | Admitting: Emergency Medicine

## 2017-01-04 DIAGNOSIS — R81 Glycosuria: Secondary | ICD-10-CM

## 2017-01-04 DIAGNOSIS — Z87891 Personal history of nicotine dependence: Secondary | ICD-10-CM | POA: Diagnosis not present

## 2017-01-04 DIAGNOSIS — B9689 Other specified bacterial agents as the cause of diseases classified elsewhere: Secondary | ICD-10-CM | POA: Diagnosis not present

## 2017-01-04 DIAGNOSIS — L080 Pyoderma: Secondary | ICD-10-CM | POA: Diagnosis not present

## 2017-01-04 DIAGNOSIS — N898 Other specified noninflammatory disorders of vagina: Secondary | ICD-10-CM | POA: Diagnosis present

## 2017-01-04 DIAGNOSIS — N760 Acute vaginitis: Secondary | ICD-10-CM | POA: Diagnosis not present

## 2017-01-04 DIAGNOSIS — Z8614 Personal history of Methicillin resistant Staphylococcus aureus infection: Secondary | ICD-10-CM | POA: Insufficient documentation

## 2017-01-04 DIAGNOSIS — E669 Obesity, unspecified: Secondary | ICD-10-CM | POA: Diagnosis not present

## 2017-01-04 LAB — POCT URINALYSIS DIP (DEVICE)
Bilirubin Urine: NEGATIVE
Glucose, UA: 500 mg/dL — AB
Ketones, ur: NEGATIVE mg/dL
Leukocytes, UA: NEGATIVE
NITRITE: NEGATIVE
PH: 6 (ref 5.0–8.0)
PROTEIN: 100 mg/dL — AB
Specific Gravity, Urine: 1.02 (ref 1.005–1.030)
Urobilinogen, UA: 0.2 mg/dL (ref 0.0–1.0)

## 2017-01-04 LAB — POCT PREGNANCY, URINE: PREG TEST UR: NEGATIVE

## 2017-01-04 MED ORDER — DOXYCYCLINE HYCLATE 100 MG PO CAPS: 100.0000 mg | ORAL_CAPSULE | Freq: Two times a day (BID) | ORAL | 0 refills | Status: DC

## 2017-01-04 MED ORDER — METRONIDAZOLE 500 MG PO TABS: 500.0000 mg | ORAL_TABLET | Freq: Two times a day (BID) | ORAL | 0 refills | Status: DC

## 2017-01-04 NOTE — ED Triage Notes (Addendum)
C/o blisters States she has [place] States she just started seeing the blisters recently   States she has vaginal discharge

## 2017-01-04 NOTE — ED Provider Notes (Signed)
CSN: [id number]     Arrival date & time 01/04/17  1007 History   First MD Initiated Contact with Patient 01/04/17 1035     Chief Complaint  Patient presents with  . blisters  . Vaginal Discharge   (Consider location/radiation/quality/duration/timing/severity/associated sxs/prior Treatment) 51 year old obese female presents with complaints of having infectious like blisters on her body. This started about 1-2 months ago. She states there was one on the right buttock, 2 with 3 to the left lower extremity below the knee and yesterday a vesicle popped up on the anterior abdomen with underlying tenderness and minimal erythema. She has a history of MRSA infections. All of the ones listed above formed vesicles initially and then eventually popped or the patient popped thumb and they formed small little ulcers and then healed by secondary intention leaving a small scar.  The other complaint is that of vaginal discharge for 2 days. No pelvic pain.      Past Medical History:  Diagnosis Date  . MRSA infection   . Sinusitis    History reviewed. No pertinent surgical history. History reviewed. No pertinent family history. Social History  Substance Use Topics  . Smoking status: Former Research scientist (life sciences)  . Smokeless tobacco: Not on file  . Alcohol use Yes     Comment: occasionally   OB History    No data available     Review of Systems  Constitutional: Negative.   Respiratory: Negative.   Gastrointestinal: Negative.   Endocrine: Positive for polyuria.  Genitourinary: Positive for vaginal discharge. Negative for difficulty urinating, dysuria, flank pain and pelvic pain.  Musculoskeletal: Negative.   Skin:       As per history of present illness  Neurological: Negative.   Psychiatric/Behavioral: Negative.  Negative for behavioral problems.  All other systems reviewed and are negative.   Allergies  Patient has no known allergies.  Home Medications   Prior to Admission medications    Medication Sig Start Date End Date Taking? Authorizing Provider  doxycycline (VIBRAMYCIN) 100 MG capsule Take 1 capsule (100 mg total) by mouth 2 (two) times daily. 01/04/17   Janne Napoleon, NP  HYDROcodone-acetaminophen (NORCO/VICODIN) 5-325 MG tablet Take 1-2 tablets by mouth every 6 (six) hours as needed. 08/25/16   Montine Circle, PA-C  metroNIDAZOLE (FLAGYL) 500 MG tablet Take 1 tablet (500 mg total) by mouth 2 (two) times daily. X 7 days 01/04/17   Janne Napoleon, NP  naproxen sodium (ANAPROX) 220 MG tablet Take 220 mg by mouth 2 (two) times daily as needed (for pain).    Historical Provider, MD   Meds Ordered and Administered this Visit  Medications - No data to display  BP 141/82 (BP Location: Left Arm)   Pulse 88   Temp 98.6 F (37 C) (Oral)   Resp 16   SpO2 100%  No data found.   Physical Exam  Constitutional: She is oriented to person, place, and time. She appears well-developed and well-nourished. No distress.  Genitourinary:  Genitourinary Comments: All external female genitalia. There is a malodorous than to slightly green bubbly discharge from the introitus. Vaginal walls with than discharge. The then discharge is more tan colored and pulled in the proximal vaginal vault. The cervix is midline with a small amount of blood at the os. Ectocervix otherwise normal. No CMT. No anterior pelvic tenderness with deep palpation. Unable to reach the cervix well with bimanual.  [place] RN present  Musculoskeletal: Normal range of motion.  Neurological: She is alert and oriented  to person, place, and time.  Skin: Skin is warm and dry.  As per history of present illness. The only nonhealing lesion is the single vesicle to the anterior abdomen just above the umbilicus at the midline. There is a 4 mm slightly raised vesicle. The underlying dermis was tender. No induration.  Psychiatric: She has a normal mood and affect.  Nursing note and vitals reviewed.   Urgent Care Course      Procedures (including critical care time)  Labs Review Labs Reviewed  POCT URINALYSIS DIP (DEVICE) - Abnormal; Notable for the following:       Result Value   Glucose, UA 500 (*)    Hgb urine dipstick SMALL (*)    Protein, ur 100 (*)    All other components within normal limits  AEROBIC CULTURE (SUPERFICIAL SPECIMEN)  POCT PREGNANCY, URINE  CERVICOVAGINAL ANCILLARY ONLY  Procedure. The small vesicle on the abdomen was cleaned with alcohol and then poked with small needle. Compression with [place] produced a small amount of thin purulent material. This was cultured. A dressing was applied.  Imaging Review No results found.   Visual Acuity Review  Right Eye Distance:   Left Eye Distance:   Bilateral Distance:    Right Eye Near:   Left Eye Near:    Bilateral Near:         MDM   1. BV (bacterial vaginosis)   2. Vaginal discharge   3. Pyoderma (skin infection)   4. Glycosuria    Try to obtain a primary care provider as soon as possible. There are numbers listed toward the end of these instructions may help. You may also calling community health and wellness for the [facility] family practice next door. Take the medication given to you as prescribed. Be sure to use soap and water frequently to cleanse the skin and wash hands frequently. Meds ordered this encounter  Medications  . doxycycline (VIBRAMYCIN) 100 MG capsule    Sig: Take 1 capsule (100 mg total) by mouth 2 (two) times daily.    Dispense:  20 capsule    Refill:  0    Order Specific Question:   Supervising Provider    Answer:   Billy Fischer [phone]  . metroNIDAZOLE (FLAGYL) 500 MG tablet    Sig: Take 1 tablet (500 mg total) by mouth 2 (two) times daily. X 7 days    Dispense:  14 tablet    Refill:  0    Order Specific Question:   Supervising Provider    Answer:   Billy Fischer [[id number]]       Janne Napoleon, NP 01/04/17 1108

## 2017-01-04 NOTE — Discharge Instructions (Signed)
Try to obtain a primary care provider as soon as possible. There are numbers listed toward the end of these instructions may help. You may also calling community health and wellness for the [facility] family practice next door. Take the medication given to you as prescribed. Be sure to use soap and water frequently to cleanse the skin and wash hands frequently.

## 2017-01-05 LAB — CERVICOVAGINAL ANCILLARY ONLY
CHLAMYDIA, DNA PROBE: NEGATIVE
Neisseria Gonorrhea: NEGATIVE
Wet Prep (BD Affirm): POSITIVE — AB

## 2017-01-06 LAB — AEROBIC CULTURE W GRAM STAIN (SUPERFICIAL SPECIMEN): Special Requests: NORMAL

## 2017-01-06 LAB — AEROBIC CULTURE  (SUPERFICIAL SPECIMEN)

## 2017-01-31 ENCOUNTER — Ambulatory Visit (HOSPITAL_COMMUNITY)
Admission: EM | Admit: 2017-01-31 | Discharge: 2017-01-31 | Disposition: A | Discharge status: Home / Self Care | Payer: BLUE CROSS/BLUE SHIELD | Attending: Internal Medicine | Admitting: Internal Medicine

## 2017-01-31 ENCOUNTER — Encounter (HOSPITAL_COMMUNITY): Payer: Self-pay | Admitting: Emergency Medicine

## 2017-01-31 DIAGNOSIS — Z87891 Personal history of nicotine dependence: Secondary | ICD-10-CM | POA: Diagnosis not present

## 2017-01-31 DIAGNOSIS — N898 Other specified noninflammatory disorders of vagina: Secondary | ICD-10-CM | POA: Diagnosis not present

## 2017-01-31 DIAGNOSIS — B3731 Acute candidiasis of vulva and vagina: Secondary | ICD-10-CM

## 2017-01-31 DIAGNOSIS — B373 Candidiasis of vulva and vagina: Secondary | ICD-10-CM | POA: Diagnosis not present

## 2017-01-31 DIAGNOSIS — N760 Acute vaginitis: Secondary | ICD-10-CM | POA: Diagnosis not present

## 2017-01-31 DIAGNOSIS — K130 Diseases of lips: Secondary | ICD-10-CM | POA: Insufficient documentation

## 2017-01-31 DIAGNOSIS — B9689 Other specified bacterial agents as the cause of diseases classified elsewhere: Secondary | ICD-10-CM | POA: Insufficient documentation

## 2017-01-31 MED ORDER — FLUCONAZOLE 150 MG PO TABS: ORAL_TABLET | ORAL | 0 refills | Status: DC

## 2017-01-31 MED ORDER — METRONIDAZOLE 500 MG PO TABS: 500.0000 mg | ORAL_TABLET | Freq: Two times a day (BID) | ORAL | 0 refills | Status: DC

## 2017-01-31 MED ORDER — MICONAZOLE NITRATE 2 % EX OINT: TOPICAL_OINTMENT | CUTANEOUS | 0 refills | Status: DC

## 2017-01-31 NOTE — ED Provider Notes (Signed)
CSN: [id number]     Arrival date & time 01/31/17  1559 History   First MD Initiated Contact with Patient 01/31/17 1625     No chief complaint on file.  (Consider location/radiation/quality/duration/timing/severity/associated sxs/prior Treatment) 51 year old female is complaining of vaginal discharge and genital itching. She states the discharge is whitish. She was seen here 1 month ago and was diagnosed with BV and a skin infection, pyoderma positive for staph. She was treated at a time with doxycycline and CNS revealed the staph sensitive to Doxy. She states the sores on her skin are gone but she never had any relief from vaginal symptoms after taking the Flagyl.      Past Medical History:  Diagnosis Date  . MRSA infection   . Sinusitis    History reviewed. No pertinent surgical history. No family history on file. Social History  Substance Use Topics  . Smoking status: Former Research scientist (life sciences)  . Smokeless tobacco: Not on file  . Alcohol use Yes     Comment: occasionally   OB History    No data available     Review of Systems  Gastrointestinal: Negative.   Genitourinary: Positive for vaginal discharge. Negative for dysuria, frequency and pelvic pain.  Musculoskeletal: Negative.   Neurological: Negative.   All other systems reviewed and are negative.   Allergies  Patient has no known allergies.  Home Medications   Prior to Admission medications   Medication Sig Start Date End Date Taking? Authorizing Provider  fluconazole (DIFLUCAN) 150 MG tablet 1 tab every other day 01/31/17   Janne Napoleon, NP  metroNIDAZOLE (FLAGYL) 500 MG tablet Take 1 tablet (500 mg total) by mouth 2 (two) times daily. X 7 days 01/31/17   Janne Napoleon, NP  Miconazole Nitrate 2 % OINT Apply small amount to corners of mouth 2 to 3 times a day 01/31/17   Janne Napoleon, NP  naproxen sodium (ANAPROX) 220 MG tablet Take 220 mg by mouth 2 (two) times daily as needed (for pain).    Historical Provider, MD   Meds Ordered and  Administered this Visit  Medications - No data to display  BP (!) 106/59 (BP Location: Left Arm) Comment (BP Location): large cuff  Pulse (!) 109   Temp 99.1 F (37.3 C)   Resp (!) 22   SpO2 99%  No data found.   Physical Exam  Constitutional: She is oriented to person, place, and time. She appears well-developed and well-nourished. No distress.  Neck: Neck supple.  Cardiovascular: Normal rate.   Pulmonary/Chest: Effort normal.  Genitourinary:  Genitourinary Comments: Normal external female genitalia. The mucosal aspect of the labia minora shiny and slightly erythematous consistent with yeast infection. Copious amount of intravaginal green bubbly discharge pooled in the vaginal wall and covering the cervix. Cervical os partly scarred, small amount of blood exuding from the os.  Musculoskeletal: She exhibits no edema or deformity.  Neurological: She is alert and oriented to person, place, and time.  Skin: Skin is warm and dry.  Psychiatric: She has a normal mood and affect.  Nursing note and vitals reviewed.   Urgent Care Course     Procedures (including critical care time)  Labs Review Labs Reviewed  CERVICOVAGINAL ANCILLARY ONLY    Imaging Review No results found.   Visual Acuity Review  Right Eye Distance:   Left Eye Distance:   Bilateral Distance:    Right Eye Near:   Left Eye Near:    Bilateral Near:  MDM   1. Vaginal discharge   2. BV (bacterial vaginosis)   3. Yeast vaginitis   4. Angular cheilosis    Mammie Lorenzo present for exam.  Use the medications as directed. This should treat most of your symptoms. We have obtained swabs for testing. If anything else grows out from the swabs and you need different medications he will be called. Make sure we have the right telephone number for relief. Also very important for you to call and obtain a primary care provider for yourself. Meds ordered this encounter  Medications  . metroNIDAZOLE  (FLAGYL) 500 MG tablet    Sig: Take 1 tablet (500 mg total) by mouth 2 (two) times daily. X 7 days    Dispense:  14 tablet    Refill:  0    Order Specific Question:   Supervising Provider    Answer:   Sherlene Shams [[id number]]  . fluconazole (DIFLUCAN) 150 MG tablet    Sig: 1 tab every other day    Dispense:  3 tablet    Refill:  0    Order Specific Question:   Supervising Provider    Answer:   Sherlene Shams [[id number]]  . Miconazole Nitrate 2 % OINT    Sig: Apply small amount to corners of mouth 2 to 3 times a day    Dispense:  4 g    Refill:  0    Order Specific Question:   Supervising Provider    Answer:   Sherlene Shams [[id number]]       Janne Napoleon, NP 01/31/17 [phone]

## 2017-01-31 NOTE — ED Triage Notes (Signed)
Tongue swollen, sores on corners of mouth, mouth burns with spicy foods.    Patient reports itchy vaginal area, flaky skin with rubbing too hard.  Patient vague on vaginal discharge answer.

## 2017-01-31 NOTE — Discharge Instructions (Signed)
Use the medications as directed. This should treat most of your symptoms. We have obtained swabs for testing. If anything else grows out from the swabs and you need different medications he will be called. Make sure we have the right telephone number for relief. Also very important for you to call and obtain a primary care provider for yourself.

## 2017-02-01 LAB — CERVICOVAGINAL ANCILLARY ONLY: WET PREP (BD AFFIRM): POSITIVE — AB

## 2017-02-02 LAB — CERVICOVAGINAL ANCILLARY ONLY
Chlamydia: NEGATIVE
Neisseria Gonorrhea: NEGATIVE

## 2018-01-03 ENCOUNTER — Inpatient Hospital Stay (HOSPITAL_COMMUNITY): Payer: BLUE CROSS/BLUE SHIELD

## 2018-01-03 ENCOUNTER — Encounter (HOSPITAL_COMMUNITY): Payer: Self-pay | Admitting: *Deleted

## 2018-01-03 ENCOUNTER — Observation Stay (HOSPITAL_COMMUNITY)
Admission: AD | Admit: 2018-01-03 | Discharge: 2018-01-04 | DRG: 745 | Disposition: A | Discharge status: Home / Self Care | Payer: BLUE CROSS/BLUE SHIELD | Source: Ambulatory Visit | Attending: Obstetrics & Gynecology | Admitting: Obstetrics & Gynecology

## 2018-01-03 DIAGNOSIS — Z7984 Long term (current) use of oral hypoglycemic drugs: Secondary | ICD-10-CM | POA: Diagnosis not present

## 2018-01-03 DIAGNOSIS — N939 Abnormal uterine and vaginal bleeding, unspecified: Secondary | ICD-10-CM | POA: Diagnosis not present

## 2018-01-03 DIAGNOSIS — Z791 Long term (current) use of non-steroidal anti-inflammatories (NSAID): Secondary | ICD-10-CM | POA: Diagnosis not present

## 2018-01-03 DIAGNOSIS — Z8614 Personal history of Methicillin resistant Staphylococcus aureus infection: Secondary | ICD-10-CM

## 2018-01-03 DIAGNOSIS — R109 Unspecified abdominal pain: Secondary | ICD-10-CM | POA: Diagnosis present

## 2018-01-03 DIAGNOSIS — Z87891 Personal history of nicotine dependence: Secondary | ICD-10-CM

## 2018-01-03 DIAGNOSIS — C539 Malignant neoplasm of cervix uteri, unspecified: Secondary | ICD-10-CM | POA: Diagnosis not present

## 2018-01-03 DIAGNOSIS — R1030 Lower abdominal pain, unspecified: Secondary | ICD-10-CM

## 2018-01-03 DIAGNOSIS — E119 Type 2 diabetes mellitus without complications: Secondary | ICD-10-CM | POA: Diagnosis not present

## 2018-01-03 LAB — CBC
HCT: 31 % — ABNORMAL LOW (ref 36.0–46.0)
Hemoglobin: 10.3 g/dL — ABNORMAL LOW (ref 12.0–15.0)
MCH: 29 pg (ref 26.0–34.0)
MCHC: 33.2 g/dL (ref 30.0–36.0)
MCV: 87.3 fL (ref 78.0–100.0)
PLATELETS: 472 10*3/uL — AB (ref 150–400)
RBC: 3.55 MIL/uL — AB (ref 3.87–5.11)
RDW: 13.5 % (ref 11.5–15.5)
WBC: 7.2 10*3/uL (ref 4.0–10.5)

## 2018-01-03 LAB — URINALYSIS, ROUTINE W REFLEX MICROSCOPIC
BILIRUBIN URINE: NEGATIVE
Glucose, UA: NEGATIVE mg/dL
Ketones, ur: NEGATIVE mg/dL
NITRITE: NEGATIVE
PH: 5 (ref 5.0–8.0)
Protein, ur: 30 mg/dL — AB
SPECIFIC GRAVITY, URINE: 1.017 (ref 1.005–1.030)

## 2018-01-03 LAB — COMPREHENSIVE METABOLIC PANEL
ALT: 16 U/L (ref 14–54)
AST: 20 U/L (ref 15–41)
Albumin: 3.3 g/dL — ABNORMAL LOW (ref 3.5–5.0)
Alkaline Phosphatase: 98 U/L (ref 38–126)
Anion gap: 5 (ref 5–15)
BILIRUBIN TOTAL: 0.6 mg/dL (ref 0.3–1.2)
BUN: 12 mg/dL (ref 6–20)
CALCIUM: 8.5 mg/dL — AB (ref 8.9–10.3)
CO2: 24 mmol/L (ref 22–32)
CREATININE: 0.61 mg/dL (ref 0.44–1.00)
Chloride: 103 mmol/L (ref 101–111)
GFR calc Af Amer: >60 mL/min (ref >60)
Glucose, Bld: 134 mg/dL — ABNORMAL HIGH (ref 65–99)
Potassium: 3.3 mmol/L — ABNORMAL LOW (ref 3.5–5.1)
Sodium: 132 mmol/L — ABNORMAL LOW (ref 135–145)
TOTAL PROTEIN: 9.1 g/dL — AB (ref 6.5–8.1)

## 2018-01-03 LAB — POCT PREGNANCY, URINE: Preg Test, Ur: NEGATIVE

## 2018-01-03 MED ORDER — IOPAMIDOL (ISOVUE-300) INJECTION 61%
30.0000 mL | INTRAVENOUS | Status: AC
  Administered 2018-01-03 (×2): 30 mL via ORAL

## 2018-01-03 MED ORDER — HYDROMORPHONE HCL 1 MG/ML IJ SOLN
1.0000 mg | Freq: Once | INTRAMUSCULAR | Status: DC
  Filled 2018-01-03: qty 1

## 2018-01-03 MED ORDER — IOPAMIDOL (ISOVUE-300) INJECTION 61%
100.0000 mL | Freq: Once | INTRAVENOUS | Status: AC | PRN
  Administered 2018-01-03: 100 mL via INTRAVENOUS

## 2018-01-03 MED ORDER — KETOROLAC TROMETHAMINE 60 MG/2ML IM SOLN
60.0000 mg | Freq: Once | INTRAMUSCULAR | Status: AC
  Administered 2018-01-03: 60 mg via INTRAMUSCULAR
  Filled 2018-01-03: qty 2

## 2018-01-03 MED ORDER — KETOROLAC TROMETHAMINE 30 MG/ML IJ SOLN
60.0000 mg | Freq: Once | INTRAMUSCULAR | Status: DC
  Filled 2018-01-03: qty 2

## 2018-01-03 MED ORDER — HYDROMORPHONE HCL 1 MG/ML IJ SOLN
1.0000 mg | Freq: Once | INTRAMUSCULAR | Status: AC
  Administered 2018-01-03: 1 mg via INTRAVENOUS
  Filled 2018-01-03: qty 1

## 2018-01-03 NOTE — MAU Provider Note (Addendum)
History     CSN: [id number]  Arrival date and time: 01/03/18 1742   First Provider Initiated Contact with Patient 01/03/18 1848      Chief Complaint  Patient presents with  . Abdominal Pain  . Vaginal Bleeding   52 y.o. female here with LAP and VB. LAP started 2 months ago. Describes as cramping, constant, and bilateral. Rates pain 7-8/10. Took Ibuprofen in past and helped but took Tylenol today and had no relief. No dysuria or polyuria but reports difficulty in starting stream over the last 2 days. She started taking AZO 2 days ago to help the LAP. She reports VB daily over the last month. She is changing her pad every 2 hrs and is 1/4-1/3 full. She is also passing quarter sized clots. Before she was bleeding she reports vaginal odor.    Past Medical History:  Diagnosis Date  . Diabetes mellitus without complication ([place])   . MRSA infection   . Sinusitis     Past Surgical History:  Procedure Laterality Date  . TUBAL LIGATION      No family history on file.  Social History   Tobacco Use  . Smoking status: Former Research scientist (life sciences)  . Smokeless tobacco: Never Used  Substance Use Topics  . Alcohol use: No    Frequency: Never    Comment: occasionally  . Drug use: No    Allergies: No Known Allergies  Medications Prior to Admission  Medication Sig Dispense Refill Last Dose  . fluconazole (DIFLUCAN) 150 MG tablet 1 tab every other day 3 tablet 0   . metroNIDAZOLE (FLAGYL) 500 MG tablet Take 1 tablet (500 mg total) by mouth 2 (two) times daily. X 7 days 14 tablet 0   . Miconazole Nitrate 2 % OINT Apply small amount to corners of mouth 2 to 3 times a day 4 g 0   . naproxen sodium (ANAPROX) 220 MG tablet Take 220 mg by mouth 2 (two) times daily as needed (for pain).   Unknown at Unknown time    Review of Systems  Constitutional: Negative for fever.  Gastrointestinal: Positive for abdominal pain and constipation. Negative for diarrhea, nausea and vomiting.  Genitourinary: Positive  for vaginal bleeding. Negative for dysuria, frequency and urgency.   Physical Exam   Blood pressure (!) 158/95, pulse 85, temperature 98.5 F (36.9 C), temperature source Oral, resp. rate 18, height 5\' 1"  (1.549 m), weight 211 lb (95.7 kg), SpO2 99 %.  Physical Exam  Constitutional: She is oriented to person, place, and time. She appears well-developed and well-nourished. No distress.  HENT:  Head: Normocephalic and atraumatic.  Neck: Normal range of motion.  Cardiovascular: Normal rate.  Respiratory: Effort normal. No respiratory distress.  GI: Soft. She exhibits no distension and no mass. There is no tenderness. There is no rebound and no guarding.  Genitourinary:  Genitourinary Comments: External: no lesions or erythema Vagina: rugated, pink, moist. Irregular, red, ratty tissue at top of vagina, cervix not visualized, small amt watery bloody discharge with foul odor.    Musculoskeletal: Normal range of motion.  Neurological: She is alert and oriented to person, place, and time.  Skin: Skin is warm and dry.  Psychiatric: She has a normal mood and affect.   Results for orders placed or performed during the hospital encounter of 01/03/18 (from the past 24 hour(s))  Urinalysis, Routine w reflex microscopic     Status: Abnormal   Collection Time: 01/03/18  6:29 PM  Result Value Ref Range  Color, Urine YELLOW YELLOW   APPearance CLOUDY (A) CLEAR   Specific Gravity, Urine 1.017 1.005 - 1.030   pH 5.0 5.0 - 8.0   Glucose, UA NEGATIVE NEGATIVE mg/dL   Hgb urine dipstick LARGE (A) NEGATIVE   Bilirubin Urine NEGATIVE NEGATIVE   Ketones, ur NEGATIVE NEGATIVE mg/dL   Protein, ur 30 (A) NEGATIVE mg/dL   Nitrite NEGATIVE NEGATIVE   Leukocytes, UA MODERATE (A) NEGATIVE   RBC / HPF TOO NUMEROUS TO COUNT 0 - 5 RBC/hpf   WBC, UA TOO NUMEROUS TO COUNT 0 - 5 WBC/hpf   Bacteria, UA FEW (A) NONE SEEN   Squamous Epithelial / LPF 0-5 (A) NONE SEEN   WBC Clumps PRESENT    Mucus PRESENT    Pregnancy, urine POC     Status: None   Collection Time: 01/03/18  6:35 PM  Result Value Ref Range   Preg Test, Ur NEGATIVE NEGATIVE  CBC     Status: Abnormal   Collection Time: 01/03/18  6:37 PM  Result Value Ref Range   WBC 7.2 4.0 - 10.5 K/uL   RBC 3.55 (L) 3.87 - 5.11 MIL/uL   Hemoglobin 10.3 (L) 12.0 - 15.0 g/dL   HCT 31.0 (L) 36.0 - 46.0 %   MCV 87.3 78.0 - 100.0 fL   MCH 29.0 26.0 - 34.0 pg   MCHC 33.2 30.0 - 36.0 g/dL   RDW 13.5 11.5 - 15.5 %   Platelets 472 (H) 150 - 400 K/uL  Comprehensive metabolic panel     Status: Abnormal   Collection Time: 01/03/18  6:37 PM  Result Value Ref Range   Sodium 132 (L) 135 - 145 mmol/L   Potassium 3.3 (L) 3.5 - 5.1 mmol/L   Chloride 103 101 - 111 mmol/L   CO2 24 22 - 32 mmol/L   Glucose, Bld 134 (H) 65 - 99 mg/dL   BUN 12 6 - 20 mg/dL   Creatinine, Ser 0.61 0.44 - 1.00 mg/dL   Calcium 8.5 (L) 8.9 - 10.3 mg/dL   Total Protein 9.1 (H) 6.5 - 8.1 g/dL   Albumin 3.3 (L) 3.5 - 5.0 g/dL   AST 20 15 - 41 U/L   ALT 16 14 - 54 U/L   Alkaline Phosphatase 98 38 - 126 U/L   Total Bilirubin 0.6 0.3 - 1.2 mg/dL   GFR calc non Af Amer >60 >60 mL/min   GFR calc Af Amer >60 >60 mL/min   Anion gap 5 5 - 15   MAU Course  Procedures  MDM Consult with Dr. Elonda Husky, recommends CT of abd/pelvis. CT pending Transfer of care given to Maryfrances Bunnell, [place]  01/03/2018 8:07 PM   CT:  Ct Abdomen Pelvis W Contrast  Addendum Date: 01/03/2018   ADDENDUM REPORT: 01/03/2018 23:33 ADDENDUM: Please note the described 5.0 x 5.5 cm complex collection within the pelvis appears to be in the region of the cervix and may represent a necrotic mass/neoplasm. The described 3 x 3 cm low attenuating lesion to the left of the rectum may represent an abnormal lymph node. These findings were discussed with Dr. Elonda Husky at 11:30 p.m. 01/03/2018. Electronically Signed   By: Anner Crete M.D.   On: 01/03/2018 23:33   Result Date: 01/03/2018 CLINICAL DATA:   52 year old female with lower abdominal pain. EXAM: CT ABDOMEN AND PELVIS WITH CONTRAST TECHNIQUE: Multidetector CT imaging of the abdomen and pelvis was performed using the standard protocol following bolus administration of intravenous contrast. CONTRAST:  139mL ISOVUE-300 IOPAMIDOL (ISOVUE-300) INJECTION 61%, <See Chart> ISOVUE-300 IOPAMIDOL (ISOVUE-300) INJECTION 61% COMPARISON:  Abdominal ultrasound dated 08/25/2016 FINDINGS: Lower chest: The visualized lung bases are clear. No intra-abdominal free air. Small amount of free fluid within pelvis. Hepatobiliary: Probable mild fatty infiltration of the liver. No intrahepatic biliary ductal dilatation. The gallbladder is unremarkable. Pancreas: Unremarkable. No pancreatic ductal dilatation or surrounding inflammatory changes. Spleen: Normal in size without focal abnormality. Adrenals/Urinary Tract: There is a 3.5 cm left renal cyst. The kidneys, visualized ureters, and urinary bladder are otherwise unremarkable. Stomach/Bowel: There is no bowel obstruction or active inflammation. The appendix is normal. Vascular/Lymphatic: The abdominal aorta and IVC appear unremarkable. No portal venous gas. A 18 mm somewhat rounded low attenuating structure along the right pelvic sidewall (series 2, image 64) likely represents an enlarged iliac chain lymph node. Reproductive: The uterus is anteverted. There is a 3 cm anterior uterine fibroid. The ovaries are poorly visualized. Other: There is a 5.0 x 5.5 cm complex collection with pockets of gas and thick enhancing rim in the lower pelvis somewhat inferior and posterior to the uterus and anterior to the rectosigmoid most consistent with an abscess. It is indeterminate whether there is combination between this collection and vagina. In addition there is a 3 x 3 cm rim enhancing fluid collection to the left of the rectum (series 2, image 73 also concerning for an abscess. Correlation with clinical exam and further evaluation with  pelvic ultrasound recommended. CT with IV and delayed oral and rectal contrast may provide additional information. Musculoskeletal: No acute or significant osseous findings. IMPRESSION: 1. Complex thick-walled fluid collection/abscess the lower pelvis with possible communication to the lower uterine/vagina. Clinical correlation and further evaluation with pelvic ultrasound recommended. CT with IV and delayed oral and rectal contrast may provide additional information. A smaller collection is also noted in the left posterior hemipelvis the left of the rectum. 2. No bowel obstruction or active inflammation.  Normal appendix. Electronically Signed: By: Anner Crete M.D. On: 01/03/2018 22:25   Dr Elonda Husky at bedside to reassess and perform pelvic examination. Recommends admission to third floor after [name].  US OB transvaginal ordered- results pending   Plan   Admission to speciality care  Care transferred to Dr Elonda Husky- orders placed  Lajean Manes, CNM 01/03/18, 11:42 PM

## 2018-01-03 NOTE — MAU Note (Signed)
Pt reports pain in her lower stomach off/on for a month. States she is presently "going through menopause" and has been bleeding for a month and passing clots.

## 2018-01-03 NOTE — H&P (Signed)
Chief Complaint  Patient presents with  . Abdominal Pain  . Vaginal Bleeding   52 y.o. female here with LAP and VB. LAP started 2 months ago. Describes as cramping, constant, and bilateral. Rates pain 7-8/10. Took Ibuprofen in past and helped but took Tylenol today and had no relief. No dysuria or polyuria but reports difficulty in starting stream over the last 2 days. She started taking AZO 2 days ago to help the LAP. She reports VB daily over the last month. She is changing her pad every 2 hrs and is 1/4-1/3 full. She is also passing quarter sized clots. Before she was bleeding she reports vaginal odor.        Past Medical History:  Diagnosis Date  . Diabetes mellitus without complication ([place])   . MRSA infection   . Sinusitis          Past Surgical History:  Procedure Laterality Date  . TUBAL LIGATION      No family history on file.  Social History        Tobacco Use  . Smoking status: Former Research scientist (life sciences)  . Smokeless tobacco: Never Used  Substance Use Topics  . Alcohol use: No    Frequency: Never    Comment: occasionally  . Drug use: No    Allergies: No Known Allergies         Medications Prior to Admission  Medication Sig Dispense Refill Last Dose  . fluconazole (DIFLUCAN) 150 MG tablet 1 tab every other day 3 tablet 0   . metroNIDAZOLE (FLAGYL) 500 MG tablet Take 1 tablet (500 mg total) by mouth 2 (two) times daily. X 7 days 14 tablet 0   . Miconazole Nitrate 2 % OINT Apply small amount to corners of mouth 2 to 3 times a day 4 g 0   . naproxen sodium (ANAPROX) 220 MG tablet Take 220 mg by mouth 2 (two) times daily as needed (for pain).   Unknown at Unknown time    Review of Systems  Constitutional: Negative for fever.  Gastrointestinal: Positive for abdominal pain and constipation. Negative for diarrhea, nausea and vomiting.  Genitourinary: Positive for vaginal bleeding. Negative for dysuria, frequency and urgency.   Physical  Exam   Blood pressure (!) 158/95, pulse 85, temperature 98.5 F (36.9 C), temperature source Oral, resp. rate 18, height 5\' 1"  (1.549 m), weight 211 lb (95.7 kg), SpO2 99 %.  Physical Exam  Constitutional: She is oriented to person, place, and time. She appears well-developed and well-nourished. No distress.  HENT:  Head: Normocephalic and atraumatic.  Neck: Normal range of motion.  Cardiovascular: Normal rate.  Respiratory: Effort normal. No respiratory distress.  GI: Soft. She exhibits no distension and no mass. There is no tenderness. There is no rebound and no guarding.  Genitourinary:  Genitourinary Comments: External: no lesions or erythema Vagina: rugated, pink, moist. Irregular, red, ratty tissue at top of vagina, cervix not visualized, small amt watery bloody discharge with foul odor.    Musculoskeletal: Normal range of motion.  Neurological: She is alert and oriented to person, place, and time.  Skin: Skin is warm and dry.  Psychiatric: She has a normal mood and affect.   Lab Results Last 24 Hours       Results for orders placed or performed during the hospital encounter of 01/03/18 (from the past 24 hour(s))  Urinalysis, Routine w reflex microscopic     Status: Abnormal   Collection Time: 01/03/18  6:29  PM  Result Value Ref Range   Color, Urine YELLOW YELLOW   APPearance CLOUDY (A) CLEAR   Specific Gravity, Urine 1.017 1.005 - 1.030   pH 5.0 5.0 - 8.0   Glucose, UA NEGATIVE NEGATIVE mg/dL   Hgb urine dipstick LARGE (A) NEGATIVE   Bilirubin Urine NEGATIVE NEGATIVE   Ketones, ur NEGATIVE NEGATIVE mg/dL   Protein, ur 30 (A) NEGATIVE mg/dL   Nitrite NEGATIVE NEGATIVE   Leukocytes, UA MODERATE (A) NEGATIVE   RBC / HPF TOO NUMEROUS TO COUNT 0 - 5 RBC/hpf   WBC, UA TOO NUMEROUS TO COUNT 0 - 5 WBC/hpf   Bacteria, UA FEW (A) NONE SEEN   Squamous Epithelial / LPF 0-5 (A) NONE SEEN   WBC Clumps PRESENT    Mucus PRESENT   Pregnancy, urine POC      Status: None   Collection Time: 01/03/18  6:35 PM  Result Value Ref Range   Preg Test, Ur NEGATIVE NEGATIVE  CBC     Status: Abnormal   Collection Time: 01/03/18  6:37 PM  Result Value Ref Range   WBC 7.2 4.0 - 10.5 K/uL   RBC 3.55 (L) 3.87 - 5.11 MIL/uL   Hemoglobin 10.3 (L) 12.0 - 15.0 g/dL   HCT 31.0 (L) 36.0 - 46.0 %   MCV 87.3 78.0 - 100.0 fL   MCH 29.0 26.0 - 34.0 pg   MCHC 33.2 30.0 - 36.0 g/dL   RDW 13.5 11.5 - 15.5 %   Platelets 472 (H) 150 - 400 K/uL  Comprehensive metabolic panel     Status: Abnormal   Collection Time: 01/03/18  6:37 PM  Result Value Ref Range   Sodium 132 (L) 135 - 145 mmol/L   Potassium 3.3 (L) 3.5 - 5.1 mmol/L   Chloride 103 101 - 111 mmol/L   CO2 24 22 - 32 mmol/L   Glucose, Bld 134 (H) 65 - 99 mg/dL   BUN 12 6 - 20 mg/dL   Creatinine, Ser 0.61 0.44 - 1.00 mg/dL   Calcium 8.5 (L) 8.9 - 10.3 mg/dL   Total Protein 9.1 (H) 6.5 - 8.1 g/dL   Albumin 3.3 (L) 3.5 - 5.0 g/dL   AST 20 15 - 41 U/L   ALT 16 14 - 54 U/L   Alkaline Phosphatase 98 38 - 126 U/L   Total Bilirubin 0.6 0.3 - 1.2 mg/dL   GFR calc non Af Amer >60 >60 mL/min   GFR calc Af Amer >60 >60 mL/min   Anion gap 5 5 - 15     MAU Course  Procedures  MDM Consult with Dr. Elonda Husky, recommends CT of abd/pelvis. CT pending Transfer of care given to Maryfrances Bunnell, [place]  01/03/2018 8:07 PM   CT:  Ct Abdomen Pelvis W Contrast  Addendum Date: 01/03/2018   ADDENDUM REPORT: 01/03/2018 23:33 ADDENDUM: Please note the described 5.0 x 5.5 cm complex collection within the pelvis appears to be in the region of the cervix and may represent a necrotic mass/neoplasm. The described 3 x 3 cm low attenuating lesion to the left of the rectum may represent an abnormal lymph node. These findings were discussed with Dr. Elonda Husky at 11:30 p.m. 01/03/2018. Electronically Signed   By: Anner Crete M.D.   On: 01/03/2018 23:33   Result Date: 01/03/2018 CLINICAL  DATA:  52 year old female with lower abdominal pain. EXAM: CT ABDOMEN AND PELVIS WITH CONTRAST TECHNIQUE: Multidetector CT imaging of the abdomen and pelvis was performed using the  standard protocol following bolus administration of intravenous contrast. CONTRAST:  144mL ISOVUE-300 IOPAMIDOL (ISOVUE-300) INJECTION 61%, <See Chart> ISOVUE-300 IOPAMIDOL (ISOVUE-300) INJECTION 61% COMPARISON:  Abdominal ultrasound dated 08/25/2016 FINDINGS: Lower chest: The visualized lung bases are clear. No intra-abdominal free air. Small amount of free fluid within pelvis. Hepatobiliary: Probable mild fatty infiltration of the liver. No intrahepatic biliary ductal dilatation. The gallbladder is unremarkable. Pancreas: Unremarkable. No pancreatic ductal dilatation or surrounding inflammatory changes. Spleen: Normal in size without focal abnormality. Adrenals/Urinary Tract: There is a 3.5 cm left renal cyst. The kidneys, visualized ureters, and urinary bladder are otherwise unremarkable. Stomach/Bowel: There is no bowel obstruction or active inflammation. The appendix is normal. Vascular/Lymphatic: The abdominal aorta and IVC appear unremarkable. No portal venous gas. A 18 mm somewhat rounded low attenuating structure along the right pelvic sidewall (series 2, image 64) likely represents an enlarged iliac chain lymph node. Reproductive: The uterus is anteverted. There is a 3 cm anterior uterine fibroid. The ovaries are poorly visualized. Other: There is a 5.0 x 5.5 cm complex collection with pockets of gas and thick enhancing rim in the lower pelvis somewhat inferior and posterior to the uterus and anterior to the rectosigmoid most consistent with an abscess. It is indeterminate whether there is combination between this collection and vagina. In addition there is a 3 x 3 cm rim enhancing fluid collection to the left of the rectum (series 2, image 73 also concerning for an abscess. Correlation with clinical exam and further evaluation  with pelvic ultrasound recommended. CT with IV and delayed oral and rectal contrast may provide additional information. Musculoskeletal: No acute or significant osseous findings. IMPRESSION: 1. Complex thick-walled fluid collection/abscess the lower pelvis with possible communication to the lower uterine/vagina. Clinical correlation and further evaluation with pelvic ultrasound recommended. CT with IV and delayed oral and rectal contrast may provide additional information. A smaller collection is also noted in the left posterior hemipelvis the left of the rectum. 2. No bowel obstruction or active inflammation.  Normal appendix. Electronically Signed: By: Anner Crete M.D. On: 01/03/2018 22:25   Dr Elonda Husky at bedside to reassess and perform pelvic examination. Recommends admission to third floor after [name].  US OB transvaginal ordered- results pending   Plan   Admission to speciality care  Care transferred to Dr Elonda Husky- orders placed  This presentation is a bit confusing Initially when Julianne Handler, CNM called me regarding this patient I was convinced she had cervical cancer and as a result ordered a CT scan to evaluate for pelvic sidewall disease involving the ureters, as well as, CMP which has subsequently returned withnormal creatinine, as patient was expressing difficulty voiding.   However after the CT the radiologist brought up the notion of an abscess.  On exam the patient is having a necrotic copious discharge not an abscess discharge. The odor is consistent with necrosis not abscess The cervix almost looks as if she has undergone a conization of the cervix, it is not an exophytic bleeding cervical mass that would be seen with most cervical cancers. There is a firmness induration to the tissue around the cervix There is a rock hard mass at about 4 o'clock , left lateral vaginal sidewall, it really feels like it is in the rectum but I suppose it is extrinsic and is pushing in to the  rectum and I conclude it is a lymph node.   I called DR Radparvar, who read the CT scan and he ran it by  a colleague.  We talked about it as we looked at thew images together. Specifically he felt as I do that the rectal mucosa seems to be intact which would make diverticular abscess seem unlikely. We came to the conclusion our number 1 concern is that this represents a necrotic cervical cancer with surrounding tissue involvement and lymphadenopathy.  It is however quite unusual  I will do a pelvic sonogram which Dr Jerelyn Scott and I agreed may give some additional delineation.  I am going to admit the patient and plan EUA with cervical biopsies tomorrow and then gyn oncology consult most probably  Florian Buff, MD 01/03/2018 11:59 PM

## 2018-01-04 ENCOUNTER — Inpatient Hospital Stay (HOSPITAL_COMMUNITY): Payer: BLUE CROSS/BLUE SHIELD | Admitting: Anesthesiology

## 2018-01-04 ENCOUNTER — Other Ambulatory Visit: Payer: Self-pay

## 2018-01-04 ENCOUNTER — Encounter (HOSPITAL_COMMUNITY)
Admission: AD | Disposition: A | Discharge status: Home / Self Care | Payer: Self-pay | Source: Ambulatory Visit | Attending: Obstetrics & Gynecology

## 2018-01-04 ENCOUNTER — Encounter (HOSPITAL_COMMUNITY): Payer: Self-pay | Admitting: Anesthesiology

## 2018-01-04 DIAGNOSIS — C538 Malignant neoplasm of overlapping sites of cervix uteri: Secondary | ICD-10-CM

## 2018-01-04 DIAGNOSIS — N939 Abnormal uterine and vaginal bleeding, unspecified: Secondary | ICD-10-CM

## 2018-01-04 DIAGNOSIS — C539 Malignant neoplasm of cervix uteri, unspecified: Principal | ICD-10-CM | POA: Diagnosis present

## 2018-01-04 LAB — GLUCOSE, CAPILLARY
GLUCOSE-CAPILLARY: 113 mg/dL — AB (ref 65–99)
Glucose-Capillary: 122 mg/dL — ABNORMAL HIGH (ref 65–99)
Glucose-Capillary: 116 mg/dL — ABNORMAL HIGH (ref 65–99)

## 2018-01-04 LAB — MRSA PCR SCREENING: MRSA BY PCR: NEGATIVE

## 2018-01-04 SURGERY — EXAM UNDER ANESTHESIA
Anesthesia: General

## 2018-01-04 MED ORDER — KETOROLAC TROMETHAMINE 30 MG/ML IJ SOLN
INTRAMUSCULAR | Status: DC | PRN
  Administered 2018-01-04: 30 mg via INTRAVENOUS

## 2018-01-04 MED ORDER — MIDAZOLAM HCL 2 MG/2ML IJ SOLN
INTRAMUSCULAR | Status: DC | PRN
  Administered 2018-01-04: 1 mg via INTRAVENOUS

## 2018-01-04 MED ORDER — MEPERIDINE HCL 25 MG/ML IJ SOLN: 6.2500 mg | INTRAMUSCULAR | Status: DC | PRN

## 2018-01-04 MED ORDER — KETOROLAC TROMETHAMINE 30 MG/ML IJ SOLN: 30.0000 mg | Freq: Once | INTRAMUSCULAR | Status: DC | PRN

## 2018-01-04 MED ORDER — ACETAMINOPHEN 325 MG PO TABS: 325.0000 mg | ORAL_TABLET | ORAL | Status: DC | PRN

## 2018-01-04 MED ORDER — FENTANYL CITRATE (PF) 250 MCG/5ML IJ SOLN
INTRAMUSCULAR | Status: DC | PRN
  Administered 2018-01-04 (×2): 50 ug via INTRAVENOUS

## 2018-01-04 MED ORDER — ONDANSETRON HCL 4 MG/2ML IJ SOLN
INTRAMUSCULAR | Status: AC
  Filled 2018-01-04: qty 2

## 2018-01-04 MED ORDER — FENTANYL CITRATE (PF) 100 MCG/2ML IJ SOLN
25.0000 ug | INTRAMUSCULAR | Status: DC | PRN
  Administered 2018-01-04: 50 ug via INTRAVENOUS

## 2018-01-04 MED ORDER — FENTANYL CITRATE (PF) 100 MCG/2ML IJ SOLN
INTRAMUSCULAR | Status: AC
  Filled 2018-01-04: qty 2

## 2018-01-04 MED ORDER — FERRIC SUBSULFATE 259 MG/GM EX SOLN
CUTANEOUS | Status: DC | PRN
  Administered 2018-01-04: 1

## 2018-01-04 MED ORDER — LACTATED RINGERS IV SOLN
INTRAVENOUS | Status: DC
  Administered 2018-01-04 (×2): via INTRAVENOUS

## 2018-01-04 MED ORDER — ONDANSETRON HCL 4 MG/2ML IJ SOLN: 4.0000 mg | Freq: Once | INTRAMUSCULAR | Status: DC | PRN

## 2018-01-04 MED ORDER — OXYCODONE HCL 5 MG PO TABS: 5.0000 mg | ORAL_TABLET | Freq: Once | ORAL | Status: DC | PRN

## 2018-01-04 MED ORDER — GLYCOPYRROLATE 0.2 MG/ML IJ SOLN
INTRAMUSCULAR | Status: DC | PRN
  Administered 2018-01-04: 0.1 mg via INTRAVENOUS

## 2018-01-04 MED ORDER — KETOROLAC TROMETHAMINE 30 MG/ML IJ SOLN
INTRAMUSCULAR | Status: AC
  Filled 2018-01-04: qty 1

## 2018-01-04 MED ORDER — ZOLPIDEM TARTRATE 5 MG PO TABS: 5.0000 mg | ORAL_TABLET | Freq: Every evening | ORAL | Status: DC | PRN

## 2018-01-04 MED ORDER — DICLOFENAC POTASSIUM 50 MG PO TABS: 50.0000 mg | ORAL_TABLET | Freq: Three times a day (TID) | ORAL | 1 refills | Status: DC | PRN

## 2018-01-04 MED ORDER — ONDANSETRON HCL 4 MG/2ML IJ SOLN
INTRAMUSCULAR | Status: DC | PRN
  Administered 2018-01-04: 4 mg via INTRAVENOUS

## 2018-01-04 MED ORDER — OXYCODONE-ACETAMINOPHEN 5-325 MG PO TABS: 1.0000 | ORAL_TABLET | Freq: Four times a day (QID) | ORAL | 0 refills | Status: DC | PRN

## 2018-01-04 MED ORDER — PROPOFOL 10 MG/ML IV BOLUS
INTRAVENOUS | Status: AC
  Filled 2018-01-04: qty 20

## 2018-01-04 MED ORDER — MIDAZOLAM HCL 2 MG/2ML IJ SOLN
INTRAMUSCULAR | Status: AC
  Filled 2018-01-04: qty 2

## 2018-01-04 MED ORDER — OXYCODONE HCL 5 MG/5ML PO SOLN: 5.0000 mg | Freq: Once | ORAL | Status: DC | PRN

## 2018-01-04 MED ORDER — LIDOCAINE HCL (CARDIAC) 20 MG/ML IV SOLN
INTRAVENOUS | Status: DC | PRN
  Administered 2018-01-04: 50 mg via INTRAVENOUS

## 2018-01-04 MED ORDER — FERRIC SUBSULFATE 259 MG/GM EX SOLN
CUTANEOUS | Status: AC
  Filled 2018-01-04: qty 8

## 2018-01-04 MED ORDER — PROPOFOL 10 MG/ML IV BOLUS
INTRAVENOUS | Status: DC | PRN
  Administered 2018-01-04: 200 mg via INTRAVENOUS

## 2018-01-04 MED ORDER — HYDROMORPHONE HCL 1 MG/ML IJ SOLN
1.0000 mg | Freq: Once | INTRAMUSCULAR | Status: AC
Start: 2018-01-04 — End: 2018-01-04
  Administered 2018-01-04: 1 mg via INTRAVENOUS
  Filled 2018-01-04: qty 1

## 2018-01-04 MED ORDER — DEXAMETHASONE SODIUM PHOSPHATE 4 MG/ML IJ SOLN
INTRAMUSCULAR | Status: AC
  Filled 2018-01-04: qty 1

## 2018-01-04 MED ORDER — ACETAMINOPHEN 160 MG/5ML PO SOLN: 325.0000 mg | ORAL | Status: DC | PRN

## 2018-01-04 SURGICAL SUPPLY — 10 items
ELECT REM PT RETURN 9FT ADLT (ELECTROSURGICAL) ×3
ELECTRODE REM PT RTRN 9FT ADLT (ELECTROSURGICAL) IMPLANT
GAUZE SPONGE 4X4 16PLY XRAY LF (GAUZE/BANDAGES/DRESSINGS) ×3 IMPLANT
GLOVE BIO SURGEON STRL SZ7 (GLOVE) ×3 IMPLANT
GLOVE BIOGEL PI IND STRL 7.0 (GLOVE) ×2 IMPLANT
GLOVE BIOGEL PI INDICATOR 7.0 (GLOVE) ×4
GOWN STRL REUS W/TWL LRG LVL3 (GOWN DISPOSABLE) ×6 IMPLANT
GOWN STRL REUS W/TWL XL LVL3 (GOWN DISPOSABLE) ×3 IMPLANT
SCOPETTES 8  STERILE (MISCELLANEOUS)
SCOPETTES 8 STERILE (MISCELLANEOUS) IMPLANT

## 2018-01-04 NOTE — Op Note (Signed)
01/04/2018  12:34 PM  PATIENT:  Misty Thomas  52 y.o. female  PRE-OPERATIVE DIAGNOSIS:  needs biopsy  POST-OPERATIVE DIAGNOSIS:  * No post-op diagnosis entered *  PROCEDURE:  Procedure(s) with comments: EXAM UNDER ANESTHESIA (N/A) - cervical biopsy  SURGEON:  Surgeon(s) and Role:    * Misty Drafts, MD - Primary  ANESTHESIA:   general  EBL:  10 mL   BLOOD ADMINISTERED:none  DRAINS: none   LOCAL MEDICATIONS USED:  NONE  SPECIMEN:  Source of Specimen:  cervical biopsies  DISPOSITION OF SPECIMEN:  PATHOLOGY  COUNTS:  YES  TOURNIQUET:  * No tourniquets in log *  DICTATION: .Note written in EPIC  PLAN OF CARE: Pt has active admit order but will be discharged to home tonight  PATIENT DISPOSITION:  PACU - hemodynamically stable.   Delay start of Pharmacological VTE agent (>24hrs) due to surgical blood loss or risk of bleeding: not applicable  Complications: none immediate  Indications: 52 yo AA female with vaginal bleeding, discharge and pain for 3 months. Last PAP unknown. Here for exam under anesthesia and biopsies. Unable to tolerate exam in the MAU.   Procedure: pt taken to the operating room where gen anesthesia was performed. She was placed in the dorsal lithotomy position and prepped and draped in the usual sterile fashion. A bivalve speculum was placed in the pts vagina and a macerated cervix was noted. There was very little that resembled a normal cervix. The tissue was necrotic and malodorous.  Several biopsies were obtained. There was actually very little bleeding from the biopsy sites although there was bleeding from within the uterine cavity above the location of the cervix.   Monsel's solution was applied the cervical area. Pt tolerated the procedure well. She was awakened from gen anesthesia and take to the recovery room in stable condition.  There were no complications.   Misty Thomas L. Misty Thomas, M.D., Misty Thomas

## 2018-01-04 NOTE — Discharge Instructions (Signed)
Cervical Cancer The cervix is the opening and bottom part of the uterus between the vagina and the uterus. Cervical cancer is a fairly common cancer. It occurs most often in women between the ages of 49 years and 52 years. Cells of the cervix act very much like skin cells. These cells are exposed to toxins, viruses, and bacteria that may cause abnormal changes. There are two kinds of cancers of the cervix:  Squamous cell carcinoma. This type of cancer starts in the flat or scale-like cells that line the cervix. Squamous cell carcinoma can develop from a sexually transmitted infection caused by the human papillomavirus (HPV).  Adenocarcinoma. This type of cervical cancer starts in glandular cells that line the cervix.  What increases the risk? The risk of getting cancer of the cervix is related to your lifestyle, sexual history, health, and immune system. Risks for cervical cancer include:  Having a sexually transmitted viral infection. These include: ? Chlamydia. ? Herpes. ? HPV.  Becoming sexually active before age 49 years.  Having more than one sexual partner or having sex with someone who has more than one sexual partner.  Not using condoms with sexual partners.  Having had cancer of the vagina or vulva.  Having a sexual partner who has or had cancer of the penis or who has had a sexual partner with abnormal cervical cells (dysplasia) or cervical cancer.  Using oral contraceptives (also called birth control pills).  Smoking.  Having a weakened immune system. For example, human immunodeficiency virus (HIV) or other immune deficiency disorders.  Being the daughter of a woman who took diethylstilbestrol (DES) during pregnancy.  Having a sister or mother who has had cancer of the cervix.  Being [place] American, Hispanic, Asian, or a woman from the [place].  A history of dysplasia of the cervix.  What are the signs or symptoms? Symptoms are usually not present in the  early stages of cervical cancer. Once the cancer invades the cervix and surrounding tissues, the woman may have:  Abnormal vaginal bleeding or menstrual bleeding that is longer or heavier than usual.  Bleeding after intercourse, douching, or a Pap test.  Vaginal bleeding following menopause.  Abnormal vaginal discharge.  Pelvic discomfort or pain.  An abnormal Pap test.  Pain during sexual intercourse.  Symptoms of more advanced cervical cancer may include:  Loss of appetite or weight loss.  Tiredness (fatigue).  Back and leg pain.  Inability to control urination or bowel movements.  How is this diagnosed? A pelvic exam and Pap test are done to diagnose the condition. If abnormalities are found during the exam or Pap test, the Pap test may be repeated in 3 months, or your health care provider may do additional tests or procedures, such as:  A colposcopy. This is a procedure that uses a special microscope that allows the health care provider to magnify and closely examine the cells of the cervix, vagina, and vulva.  Cervical biopsies. This is a procedure where small tissue samples are taken from the cervix to be examined under a microscope by a specialist.  A cone biopsy. This is a procedure to test for or remove cancerous tissue.  Other tests may be needed, including:  Cystoscopy.  Proctoscopy or sigmoidoscopy.  Ultrasound.  CT scan.  MRI.  Laparoscopy.  There are different stages of cervical cancer:  Stage 0, carcinoma in situ (CIS)--This first stage of cancer is the last and most serious stage of dysplasia.  Stage I--This means  the tumor is in the uterus and cervix only.  Stage II--This means the tumor has spread to the upper vagina. The cancer has spread beyond the uterus but not to the pelvic walls or lower third of the vagina.  Stage III--This means the tumor has invaded the side wall of the pelvis and the lower third of the vagina. If the tumor blocks  the tubes that carry urine to the bladder (ureters), it may cause urine to back up and the kidneys to swell (hydronephrosis).  Stage IV--This means the tumor has spread to the rectum or bladder. In the later part of this stage, it has also spread to distant organs, like the lungs.  How is this treated? Treatment options can include:  Cone biopsy to remove the cancerous tissue.  Removal of the entire uterus and cervix.  Removal of the uterus, cervix, upper vagina, lymph nodes, and surrounding tissue (modified radical hysterectomy). The ovaries may be left in place or removed.  Medicines to treat cancer.  A combination of surgery, radiation, and chemotherapy.  Biological response modifiers. These are substances that help strengthen your immune system's fight against cancer or infection. They may be used in combination with chemotherapy.  Follow these instructions at home:  Get a gynecology exam and Pap test once every year or as directed by your health care provider.  Get the HPV vaccine.  Do not smoke.  Do not have sexual intercourse until your health care provider says it is okay.  Use a condom every time you have sex. Contact a health care provider if:  You have increased pelvic pain or pressure.  Your are becoming increasingly tired.  You have increased leg or back pain.  You have a fever.  You have abnormal bleeding or discharge.  You lose weight. Get help right away if:  You cannot urinate.  You have blood in your urine.  You have blood or pressure with a bowel movement.  You develop severe back, stomach, or pelvic pain. This information is not intended to replace advice given to you by your health care provider. Make sure you discuss any questions you have with your health care provider. Document Released: 10/24/2005 Document Revised: 04/06/2016 Document Reviewed: 04/17/2013 Elsevier Interactive Patient Education  2017 [employer].

## 2018-01-04 NOTE — Brief Op Note (Signed)
01/04/2018  12:34 PM  PATIENT:  Misty Thomas  52 y.o. female  PRE-OPERATIVE DIAGNOSIS:  needs biopsy  POST-OPERATIVE DIAGNOSIS:  * No post-op diagnosis entered *  PROCEDURE:  Procedure(s) with comments: EXAM UNDER ANESTHESIA (N/A) - cervical biopsy  SURGEON:  Surgeon(s) and Role:    * Lavonia Drafts, MD - Primary  ANESTHESIA:   general  EBL:  10 mL   BLOOD ADMINISTERED:none  DRAINS: none   LOCAL MEDICATIONS USED:  NONE  SPECIMEN:  Source of Specimen:  cervical biopsies  DISPOSITION OF SPECIMEN:  PATHOLOGY  COUNTS:  YES  TOURNIQUET:  * No tourniquets in log *  DICTATION: .Note written in EPIC  PLAN OF CARE: Pt has active admit order but will be discharged to home tonight  PATIENT DISPOSITION:  PACU - hemodynamically stable.   Delay start of Pharmacological VTE agent (>24hrs) due to surgical blood loss or risk of bleeding: not applicable  Complications: none immediate  Tauheedah Bok L. Harraway-Smith, M.D., [name]

## 2018-01-04 NOTE — Progress Notes (Signed)
Dr. Ihor Dow at bedside discussing POC and discharge.

## 2018-01-04 NOTE — Anesthesia Procedure Notes (Signed)
Procedure Name: LMA Insertion Date/Time: 01/04/2018 11:33 AM Performed by: Flossie Dibble, CRNA Pre-anesthesia Checklist: Patient identified, Patient being monitored, Emergency Drugs available, Timeout performed and Suction available Patient Re-evaluated:Patient Re-evaluated prior to induction Oxygen Delivery Method: Circle System Utilized Preoxygenation: Pre-oxygenation with 100% oxygen Induction Type: IV induction Ventilation: Mask ventilation without difficulty LMA: LMA inserted LMA Size: 4.0 Number of attempts: 1 Placement Confirmation: positive ETCO2 and breath sounds checked- equal and bilateral Tube secured with: Tape Dental Injury: Teeth and Oropharynx as per pre-operative assessment

## 2018-01-04 NOTE — Transfer of Care (Signed)
Immediate Anesthesia Transfer of Care Note  Patient: Misty Thomas  Procedure(s) Performed: Jasmine December UNDER ANESTHESIA (N/A )  Patient Location: PACU  Anesthesia Type:General  Level of Consciousness: awake, alert  and oriented  Airway & Oxygen Therapy: Patient Spontanous Breathing and Patient connected to nasal cannula oxygen  Post-op Assessment: Report given to RN and Post -op Vital signs reviewed and stable  Post vital signs: Reviewed and stable  Last Vitals:  Vitals:   01/04/18 1215 01/04/18 1239  BP: 134/88   Pulse: 77 69  Resp: 13 12  Temp: 37 C   SpO2: 99% 100%    Last Pain:  Vitals:   01/04/18 1239  TempSrc:   PainSc: 5       Patients Stated Pain Goal: 3 (56/15/37 9432)  Complications: No apparent anesthesia complications

## 2018-01-04 NOTE — Discharge Summary (Addendum)
Physician Discharge Summary  Patient ID: Misty Thomas MRN: [id number] DOB/AGE: 1966-02-15 52 y.o.  Admit date: 01/03/2018 Discharge date: 01/05/2018  Admission Diagnoses:  Discharge Diagnoses:  Active Problems:   Cervical cancer (HCC)   Abnormal vaginal bleeding   Discharged Condition: good  Hospital Course: Patient had an uncomplicated procedure; for further details of this surgery, please refer to the operative note. Furthermore, the patient had an uncomplicated postoperative course.  By time of discharge, her pain was controlled on oral pain medications; she was ambulating, voiding without difficulty, tolerating regular diet and passing flatus.  She was deemed stable for discharge to home.    Consults: None  Significant Diagnostic Studies: labs: CBC; Rad: CT scan and surg path that is pending.   Treatments: surgery: Exam under anesthesia with multiple biopsies  Discharge Exam: Blood pressure 134/80, pulse 95, temperature 98 F (36.7 C), temperature source Oral, resp. rate 18, height 5\' 1"  (1.549 m), weight 211 lb (95.7 kg), SpO2 98 %.  Pt in NAD   Disposition: 01-Home or Self Care  Discharge Instructions    Call MD for:  difficulty breathing, headache or visual disturbances   Complete by:  As directed    Call MD for:  extreme fatigue   Complete by:  As directed    Call MD for:  persistant dizziness or light-headedness   Complete by:  As directed    Call MD for:  persistant nausea and vomiting   Complete by:  As directed    Call MD for:  redness, tenderness, or signs of infection (pain, swelling, redness, odor or green/yellow discharge around incision site)   Complete by:  As directed    Call MD for:  severe uncontrolled pain   Complete by:  As directed    Call MD for:  temperature >100.4   Complete by:  As directed    Diet - low sodium heart healthy   Complete by:  As directed    Driving Restrictions   Complete by:  As directed    No driving for 24 hours   Increase activity slowly   Complete by:  As directed      Allergies as of 01/04/2018   No Known Allergies     Medication List    STOP taking these medications   acetaminophen 500 MG tablet Commonly known as:  TYLENOL   ibuprofen 200 MG tablet Commonly known as:  ADVIL,MOTRIN   metroNIDAZOLE 500 MG tablet Commonly known as:  FLAGYL     TAKE these medications   diclofenac 50 MG tablet Commonly known as:  CATAFLAM Take 1 tablet (50 mg total) by mouth 3 (three) times daily as needed.   ESTROVEN + ENERGY MAX STRENGTH PO Take 1 tablet by mouth daily.   metFORMIN 500 MG tablet Commonly known as:  GLUCOPHAGE Take 250 mg by mouth 2 (two) times daily with a meal.   Miconazole Nitrate 2 % Oint Apply small amount to corners of mouth 2 to 3 times a day   oxyCODONE-acetaminophen 5-325 MG tablet Commonly known as:  PERCOCET/ROXICET Take 1-2 tablets by mouth every 6 (six) hours as needed for severe pain.     Appointment to be scheduled by GYN ONC- note sent to [facility] Surg path pending    Signed: Lavonia Drafts 01/05/2018, 3:02 PM

## 2018-01-04 NOTE — Anesthesia Postprocedure Evaluation (Signed)
Anesthesia Post Note  Patient: Misty Thomas  Procedure(s) Performed: EXAM UNDER ANESTHESIA (N/A )     Patient location during evaluation: PACU Anesthesia Type: General Level of consciousness: awake and alert Pain management: pain level controlled Vital Signs Assessment: post-procedure vital signs reviewed and stable Respiratory status: spontaneous breathing, nonlabored ventilation, respiratory function stable and patient connected to nasal cannula oxygen Cardiovascular status: blood pressure returned to baseline and stable Postop Assessment: no apparent nausea or vomiting Anesthetic complications: no    Last Vitals:  Vitals:   01/04/18 1333 01/04/18 1445  BP: 134/71 (!) 146/73  Pulse: 69 70  Resp: 16 16  Temp: 36.6 C 36.9 C  SpO2: 98% 98%    Last Pain:  Vitals:   01/04/18 1445  TempSrc: Oral  PainSc: 2    Pain Goal: Patients Stated Pain Goal: 3 (01/04/18 1445)               [name]

## 2018-01-04 NOTE — Anesthesia Preprocedure Evaluation (Signed)
Anesthesia Evaluation  Patient identified by MRN, date of birth, ID band Patient awake    Reviewed: Allergy & Precautions, NPO status , Patient's Chart, lab work & pertinent test results  Airway Mallampati: II  TM Distance: >3 FB Neck ROM: Full    Dental no notable dental hx.    Pulmonary neg pulmonary ROS, former smoker,    Pulmonary exam normal breath sounds clear to auscultation       Cardiovascular negative cardio ROS Normal cardiovascular exam Rhythm:Regular Rate:Normal     Neuro/Psych negative neurological ROS  negative psych ROS   GI/Hepatic negative GI ROS, Neg liver ROS,   Endo/Other  negative endocrine ROSdiabetes  Renal/GU negative Renal ROS  negative genitourinary   Musculoskeletal negative musculoskeletal ROS (+)   Abdominal   Peds negative pediatric ROS (+)  Hematology negative hematology ROS (+)   Anesthesia Other Findings   Reproductive/Obstetrics negative OB ROS                            Anesthesia Physical Anesthesia Plan  ASA: III and emergent  Anesthesia Plan: General   Post-op Pain Management:    Induction: Intravenous  PONV Risk Score and Plan: 3 and Ondansetron, Dexamethasone and Treatment may vary due to age or medical condition  Airway Management Planned: LMA and Oral ETT  Additional Equipment:   Intra-op Plan:   Post-operative Plan: Extubation in OR  Informed Consent: I have reviewed the patients History and Physical, chart, labs and discussed the procedure including the risks, benefits and alternatives for the proposed anesthesia with the patient or authorized representative who has indicated his/her understanding and acceptance.     Plan Discussed with: CRNA, Surgeon and Anesthesiologist  Anesthesia Plan Comments: ( )        Anesthesia Quick Evaluation

## 2018-01-04 NOTE — Progress Notes (Signed)
Discharge instructions given to patient, work note given, meds reviewed. Pt verbalizes understanding. Pt discharged with her spouse in stable condition.

## 2018-01-05 ENCOUNTER — Telehealth: Payer: Self-pay | Admitting: *Deleted

## 2018-01-05 DIAGNOSIS — N939 Abnormal uterine and vaginal bleeding, unspecified: Secondary | ICD-10-CM

## 2018-01-05 NOTE — Telephone Encounter (Signed)
Called and spoke with the patient, gave the new patient appt for March 11th at 10:15am. Patient to arrive at 10am and to bring a list of her medicines. Advised the patient about the free valet and pelvic exam.   Demographics and insurance verified

## 2018-01-15 ENCOUNTER — Encounter: Payer: Self-pay | Admitting: Radiation Oncology

## 2018-01-15 ENCOUNTER — Encounter: Payer: Self-pay | Admitting: Gynecologic Oncology

## 2018-01-15 ENCOUNTER — Inpatient Hospital Stay: Payer: BLUE CROSS/BLUE SHIELD | Attending: Gynecologic Oncology | Admitting: Gynecologic Oncology

## 2018-01-15 VITALS — BP 126/83 | HR 71 | Temp 98.2°F | Resp 18 | Ht 61.0 in | Wt 216.7 lb

## 2018-01-15 DIAGNOSIS — R946 Abnormal results of thyroid function studies: Secondary | ICD-10-CM | POA: Insufficient documentation

## 2018-01-15 DIAGNOSIS — E119 Type 2 diabetes mellitus without complications: Secondary | ICD-10-CM | POA: Insufficient documentation

## 2018-01-15 DIAGNOSIS — C539 Malignant neoplasm of cervix uteri, unspecified: Secondary | ICD-10-CM | POA: Diagnosis not present

## 2018-01-15 DIAGNOSIS — G893 Neoplasm related pain (acute) (chronic): Secondary | ICD-10-CM | POA: Insufficient documentation

## 2018-01-15 NOTE — Progress Notes (Signed)
Consult Note: Gyn-Onc  Consult was requested by Dr. Ihor Dow for the evaluation of Misty Thomas 52 y.o. female  CC:  Chief Complaint  Patient presents with  . Malignant neoplasm of cervix, unspecified site [facility])    Assessment/Plan:  [name]  is a 52 y.o.  year old with stage IIIC cervical poorly differentiated carcinoma.   I discussed the diagnosis and prognosis with the patient.  I am recommending a PET scan to evaluate for distant metastatic disease.  I am recommending consultation with medical and radiation oncology and discussion of definitive therapy with chemoradiation with curative intent.  I discussed potential toxicities related to therapy. I discussed the importance of long term follow-up with this disease and risk for recurrence.  The patient has significant leakage of fluid from the vagina. 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   The patient has been recently diagnosed with diabetes, but is not yet on treatment for this because she was diagnosed at an urgent care. We will assist in finding her a primary care provider.  HPI: Ms Misty Thomas is a 52 year old P3 who is seen in consultation at the request of Dr Ihor Dow for cervical cancer.  The patient reports"several months"of  abnormal uterine bleeding and pelvic pain.  She was seen approximately 3 months ago at [facility] urgent care where according to the patient they performed a Pap smear, though this may likely reflect just a speculum exam, and no abnormalities were identified.  She states that her last Pap smear was a couple of years ago however it is unclear to the patient if this was within 5 years or greater than that.  She denies ever having history of an abnormal Pap smear.  She continued to have abnormal bleeding and eventually was evaluated at [facility] where a CT scan of the abdomen and pelvis was performed on every 27th 2019.  This  showed a small amount of free fluid within the pelvis, there is a 3.5 cm left renal cyst.  There is an 18 mm right pelvic sidewall lesion likely representing an enlarged iliac chain lymph node.  The ovaries were poorly visualized.  There is a 5 x 5.5 complex collection or mass that is within the pelvis and appears to be in the region of the cervix and may represent a necrotic mass or neoplasm.  There is a 3 x 3 cm low attenuating lesion to the left of the rectum which may represent an abnormal lymph node.  On physical examination there was red ratty tissue at the top of the vagina and the cervix was not visualized there is a small amount of watery bloody discharge with foul odor appreciated.  She was taken to the operating room on January 04, 2018 for an exam under anesthesia with biopsies that she was not able to tolerate an exam in the outpatient setting.  The patient was taken to the operating room where general anesthesia was performed she had insertion of a bivalve speculum in the vagina and the macerated cervix was noted a biopsy was taken from the cervix.  The pathology from this procedure revealed poorly differentiated carcinoma staining positive for cytokeratin, P 63, NP 16 and vimentin, negative for ER.  She reports watery bloody discharge from vagina.   The patient otherwise is of unknown general medical health she does not see a primary care doctor.  She has been seen in urgent care  on multiple occasions where she was diagnosed with diabetes however this is not being treated as she does not have a regular provider.  She has a remote history of an MRSA infection approximately 4 years ago.   Current Meds:  Outpatient Encounter Medications as of 01/15/2018  Medication Sig  . diclofenac (CATAFLAM) 50 MG tablet Take 1 tablet (50 mg total) by mouth 3 (three) times daily as needed.  . metFORMIN (GLUCOPHAGE) 500 MG tablet Take 250 mg by mouth 2 (two) times daily with a meal.  . Miconazole Nitrate 2 %  OINT Apply small amount to corners of mouth 2 to 3 times a day  . Misc Natural Products (ESTROVEN + ENERGY MAX STRENGTH PO) Take 1 tablet by mouth daily.  Marland Kitchen oxyCODONE-acetaminophen (PERCOCET/ROXICET) 5-325 MG tablet Take 1-2 tablets by mouth every 6 (six) hours as needed for severe pain.   No facility-administered encounter medications on file as of 01/15/2018.     Allergy: No Known Allergies  Social Hx:   Social History   Socioeconomic History  . Marital status: Divorced    Spouse name: Not on file  . Number of children: Not on file  . Years of education: Not on file  . Highest education level: Not on file  Social Needs  . Financial resource strain: Not on file  . Food insecurity - worry: Not on file  . Food insecurity - inability: Not on file  . Transportation needs - medical: Not on file  . Transportation needs - non-medical: Not on file  Occupational History  . Not on file  Tobacco Use  . Smoking status: Never Smoker  . Smokeless tobacco: Never Used  Substance and Sexual Activity  . Alcohol use: No    Frequency: Never    Comment: occasionally  . Drug use: No  . Sexual activity: Yes    Birth control/protection: Surgical  Other Topics Concern  . Not on file  Social History Narrative  . Not on file    Past Surgical Hx:  Past Surgical History:  Procedure Laterality Date  . TUBAL LIGATION      Past Medical Hx:  Past Medical History:  Diagnosis Date  . Diabetes mellitus without complication ([place])   . MRSA infection   . Sinusitis     Past Gynecological History:  SVD x 3 No LMP recorded. Patient is premenopausal.  Family Hx: History reviewed. No pertinent family history.  Review of Systems:  Constitutional  Feels well,   ENT Normal appearing ears and nares bilaterally Skin/Breast  No rash, sores, jaundice, itching, dryness Cardiovascular  No chest pain, shortness of breath, or edema  Pulmonary  No cough or wheeze.  Gastro Intestinal  No nausea,  vomitting, or diarrhoea. No bright red blood per rectum, change in bowel movement, or constipation. + pelvic pain Genito Urinary  + watery discharge, bleeding Musculo Skeletal  No myalgia, arthralgia, joint swelling or pain  Neurologic  No weakness, numbness, change in gait,  Psychology  No depression, anxiety, insomnia.   Vitals:  Blood pressure 126/83, pulse 71, temperature 98.2 F (36.8 C), temperature source Oral, resp. rate 18, height 5\' 1"  (1.549 m), weight 216 lb 11.2 oz (98.3 kg), SpO2 100 %.  Physical Exam: WD in NAD Neck  Supple NROM, without any enlargements.  Lymph Node Survey No cervical supraclavicular or inguinal adenopathy Cardiovascular  Pulse normal rate, regularity and rhythm. S1 and S2 normal.  Lungs  Clear to auscultation bilateraly, without wheezes/crackles/rhonchi. Good air movement.  Skin  No rash/lesions/breakdown  Psychiatry  Alert and oriented to person, place, and time  Abdomen  Normoactive bowel sounds, abdomen soft, non-tender and obese without evidence of hernia. Back No CVA tenderness Genito Urinary  Vulva/vagina: Normal external female genitalia.   No lesions. No discharge or bleeding.  Bladder/urethra:  No lesions or masses, well supported bladder  Vagina: grossly normal, there may be some left anterior upper fornix involement. Watery bloody fluid accumulating in vagina.   Cervix: 6-7cm cervical mass replacing normal cervix. + bilateral parametrial involvement.    Uterus:  Slightly bulky, mobile, no parametrial involvement or nodularity.  Adnexa: no discrete masses. Rectal  Good tone, no masses no cul de sac nodularity. There is bilateral parametrial involvement.  Extremities  No bilateral cyanosis, clubbing or edema.   Thereasa Solo, MD  01/15/2018, 4:39 PM

## 2018-01-15 NOTE — Patient Instructions (Addendum)
Plan on having a PET scan to evaluate for spread of the cancer on Monday, March 18.  Nothing to eat or drink six hours before your test.  We will also arrange for you to meet with Dr. Gery Pray in Radiation on Wednesday March 20 at 12:30pm at the [facility].  Meet with Dr. [name] with Medical Oncologist to discuss chemotherapy on Monday, March 18 after you have your PET scan.

## 2018-01-19 ENCOUNTER — Encounter: Payer: Self-pay | Admitting: Radiation Oncology

## 2018-01-19 NOTE — Progress Notes (Signed)
GYN Location of Tumor / Histology: stage IIIC cervical poorly differentiated carcinoma  Misty Thomas presented with symptoms of: "several months"of  abnormal uterine bleeding and pelvic pain.  Biopsies revealed:   01/04/18 Diagnosis Cervix, biopsy - POORLY DIFFERENTIATED CARCINOMA - SEE COMMENT Microscopic Comment The biopsy material consists of multiple fragments of tissue with infiltrative tumor and granulation tissue. By immunohistochemistry, the tumor is positive for cytokeratin 5/6, p63, p16 and vimentin (diffusely positive) but negative for ER. 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  Past/Anticipated interventions by Gyn/Onc surgery, if any: no  Past/Anticipated interventions by medical oncology, if any: apt with Dr. Alvy Bimler on 01/25/18  Weight changes, if any: no  Bowel/Bladder complaints, if any: denies having any bladder issues.  Reports having some constipation with last bm this morning.  Nausea/Vomiting, if any: no  Pain issues, if any:  Has constant aching in her central pelvis.  She is currently taking Percocet q 8 hours.  SAFETY ISSUES:  Prior radiation? no  Pacemaker/ICD? no  Possible current pregnancy? no  Is the patient on methotrexate? no  Current Complaints / other details:  PET scan on 01/22/18. Therapy with chemoradiation with curative intent.  Patient is here with her husband.  She reports having a small amount of vaginal bleeding now.  BP (!) 147/93 (BP Location: Right Wrist, Patient Position: Sitting)   Pulse 88   Temp 98.4 F (36.9 C) (Oral)   Ht 5\' 1"  (1.549 m)   Wt 221 lb 6.4 oz (100.4 kg)   SpO2 99%   BMI 41.83 kg/m    Wt Readings from Last 3 Encounters:  01/24/18 221 lb 6.4 oz (100.4 kg)  01/15/18 216 lb 11.2 oz (98.3 kg)  01/03/18 211 lb (95.7 kg)

## 2018-01-22 ENCOUNTER — Telehealth: Payer: Self-pay | Admitting: *Deleted

## 2018-01-22 ENCOUNTER — Ambulatory Visit (HOSPITAL_COMMUNITY)
Admission: RE | Admit: 2018-01-22 | Discharge: 2018-01-22 | Disposition: A | Discharge status: Home / Self Care | Payer: BLUE CROSS/BLUE SHIELD | Source: Ambulatory Visit | Attending: Gynecologic Oncology | Admitting: Gynecologic Oncology

## 2018-01-22 ENCOUNTER — Ambulatory Visit: Payer: BLUE CROSS/BLUE SHIELD | Admitting: Hematology and Oncology

## 2018-01-22 DIAGNOSIS — I70209 Unspecified atherosclerosis of native arteries of extremities, unspecified extremity: Secondary | ICD-10-CM | POA: Insufficient documentation

## 2018-01-22 DIAGNOSIS — I700 Atherosclerosis of aorta: Secondary | ICD-10-CM | POA: Insufficient documentation

## 2018-01-22 DIAGNOSIS — C539 Malignant neoplasm of cervix uteri, unspecified: Secondary | ICD-10-CM | POA: Diagnosis not present

## 2018-01-22 DIAGNOSIS — I517 Cardiomegaly: Secondary | ICD-10-CM | POA: Diagnosis not present

## 2018-01-22 DIAGNOSIS — D259 Leiomyoma of uterus, unspecified: Secondary | ICD-10-CM | POA: Diagnosis not present

## 2018-01-22 DIAGNOSIS — R590 Localized enlarged lymph nodes: Secondary | ICD-10-CM | POA: Insufficient documentation

## 2018-01-22 LAB — GLUCOSE, CAPILLARY: GLUCOSE-CAPILLARY: 118 mg/dL — AB (ref 65–99)

## 2018-01-22 MED ORDER — FLUDEOXYGLUCOSE F - 18 (FDG) INJECTION
10.7000 | Freq: Once | INTRAVENOUS | Status: AC | PRN
  Administered 2018-01-22: 10.7 via INTRAVENOUS

## 2018-01-22 NOTE — Telephone Encounter (Signed)
Patient called and stated that she forgot her appt for today with Dr. Alvy Bimler. Advised the patient that she called Dr. Denman George office and I would give the message to Dr. Alvy Bimler.

## 2018-01-23 ENCOUNTER — Telehealth: Payer: Self-pay | Admitting: *Deleted

## 2018-01-23 NOTE — Telephone Encounter (Signed)
Called and gave the patient the appt for March 21st at 2pm with Dr. Alvy Bimler

## 2018-01-24 ENCOUNTER — Encounter: Payer: Self-pay | Admitting: Radiation Oncology

## 2018-01-24 ENCOUNTER — Ambulatory Visit
Admission: RE | Admit: 2018-01-24 | Discharge: 2018-01-24 | Disposition: A | Discharge status: Home / Self Care | Payer: BLUE CROSS/BLUE SHIELD | Source: Ambulatory Visit | Attending: Radiation Oncology | Admitting: Radiation Oncology

## 2018-01-24 ENCOUNTER — Other Ambulatory Visit: Payer: Self-pay

## 2018-01-24 VITALS — BP 147/93 | HR 88 | Temp 98.4°F | Ht 61.0 in | Wt 221.4 lb

## 2018-01-24 DIAGNOSIS — C530 Malignant neoplasm of endocervix: Secondary | ICD-10-CM | POA: Diagnosis present

## 2018-01-24 DIAGNOSIS — Z79899 Other long term (current) drug therapy: Secondary | ICD-10-CM | POA: Diagnosis not present

## 2018-01-24 DIAGNOSIS — Z7984 Long term (current) use of oral hypoglycemic drugs: Secondary | ICD-10-CM | POA: Diagnosis not present

## 2018-01-24 DIAGNOSIS — E119 Type 2 diabetes mellitus without complications: Secondary | ICD-10-CM | POA: Insufficient documentation

## 2018-01-24 HISTORY — DX: Malignant neoplasm of cervix uteri, unspecified: C53.9

## 2018-01-24 MED ORDER — OXYCODONE-ACETAMINOPHEN 5-325 MG PO TABS: 1.0000 | ORAL_TABLET | Freq: Four times a day (QID) | ORAL | 0 refills | Status: DC | PRN

## 2018-01-24 NOTE — Progress Notes (Addendum)
Radiation Oncology         [phone]) [phone] ________________________________  Name: Misty Thomas        MRN: [id number]  Date of Service: 01/24/2018 DOB: May 27, 1966  BC:[id number], Provider, MD  Everitt Amber, MD     REFERRING PHYSICIAN: Everitt Amber, MD   DIAGNOSIS: The encounter diagnosis was Malignant neoplasm of endocervix [facility]). Stage IIIB, squamous cell carcinoma of the cervix   HISTORY OF PRESENT ILLNESS: Misty Thomas is a 52 y.o. G3P3 female seen at the request of Dr. Denman George for a newly diagnosed poorly differentiated squamous cell  carcinoma of the cervix. She presented with several months of pelvic pain and abnormal vaginal bleeding. She had reported no prior history of pap smear, though the last time this was performed prior to her biopsy is unknown.  She was ultimately evaluated at [facility] and a CT abdomen/pelvis on 01/03/18 revealed a 5 x 5.5 cm complex collection in the pelvis, there is also a 3 x 3 cm lesion to the left of the rectum concerning for perirectal adenopathy. On 01/04/18 a biopsy of the cervix revealed a poorly differentiated carcinoma consistent with squamous cell carcinoma. She met with Dr. Denman George and a PET scan on 01/22/18 that revealed significant hypermetabolism of the cervix, perirectal space, bilateral pelvic adenopathy, and scattered hypermetabolic changes in the neck and axilla that is low level, and are suspected to be reactive. She has diffuse bilateral thyroid activity concerning for thyroiditis. She comes today to discuss options of radiotherapy. She is scheduled to meet with Dr. Alvy Bimler tomorrow.    PREVIOUS RADIATION THERAPY: No   PAST MEDICAL HISTORY:  Past Medical History:  Diagnosis Date  . Cervical cancer ([place])   . Diabetes mellitus without complication ([place])   . MRSA infection   . Sinusitis        PAST SURGICAL HISTORY: Past Surgical History:  Procedure Laterality Date  . TUBAL LIGATION       FAMILY HISTORY:  Family History  Problem  Relation Age of Onset  . Cancer Maternal Grandmother      SOCIAL HISTORY:  reports that  has never smoked. she has never used smokeless tobacco. She reports that she does not drink alcohol or use drugs. The patient  lives in [place]. She works at [employer] and has been married for about 30 years. She and her husband work together.   ALLERGIES: Patient has no known allergies.   MEDICATIONS:  Current Outpatient Medications  Medication Sig Dispense Refill  . diclofenac (CATAFLAM) 50 MG tablet Take 1 tablet (50 mg total) by mouth 3 (three) times daily as needed. 60 tablet 1  . metFORMIN (GLUCOPHAGE) 500 MG tablet Take 250 mg by mouth 2 (two) times daily with a meal.    . Misc Natural Products (ESTROVEN + ENERGY MAX STRENGTH PO) Take 1 tablet by mouth daily.    Marland Kitchen oxyCODONE-acetaminophen (PERCOCET/ROXICET) 5-325 MG tablet Take 1-2 tablets by mouth every 6 (six) hours as needed for severe pain. 30 tablet 0  . Miconazole Nitrate 2 % OINT Apply small amount to corners of mouth 2 to 3 times a day (Patient not taking: Reported on 01/24/2018) 4 g 0   No current facility-administered medications for this encounter.      REVIEW OF SYSTEMS: On review of systems, the patient reports that she is doing well overall. She denies any chest pain, shortness of breath, cough, fevers, chills, night sweats, unintended weight changes. She continues to have pelvic pain, occasional  discharge and vaginal bleeding that requires a pad change 1-2 times per day. She denies any bowel or bladder disturbances, and denies upper abdominal pain, nausea or vomiting. She denies any new musculoskeletal or joint aches or pains. A complete review of systems is obtained and is otherwise negative.     PHYSICAL EXAM:  Wt Readings from Last 3 Encounters:  01/24/18 221 lb 6.4 oz (100.4 kg)  01/15/18 216 lb 11.2 oz (98.3 kg)  01/03/18 211 lb (95.7 kg)   Temp Readings from Last 3 Encounters:  01/24/18 98.4 F (36.9 C) (Oral)    01/15/18 98.2 F (36.8 C) (Oral)  01/04/18 98 F (36.7 C) (Oral)   BP Readings from Last 3 Encounters:  01/24/18 (!) 147/93  01/15/18 126/83  01/04/18 134/80   Pulse Readings from Last 3 Encounters:  01/24/18 88  01/15/18 71  01/04/18 95   Pain Assessment Pain Score: 2  Pain Loc: Groin/10  In general this is a well appearing African American female in no acute distress. She is alert and oriented x4 and appropriate throughout the examination. HEENT reveals that the patient is normocephalic, atraumatic. EOMs are intact. PERRLA. Skin is intact without any evidence of gross lesions. Cardiovascular exam reveals a regular rate and rhythm, no clicks rubs or murmurs are auscultated. Chest is clear to auscultation bilaterally. Lymphatic assessment is performed and does not reveal any adenopathy in the cervical, supraclavicular, axillary, or inguinal chains. Bilateral breast exam reveals no palpable masses of the breasts, no nipple discharge or bleeding. Abdomen has active bowel sounds in all quadrants and is intact. The abdomen is soft, non tender, non distended. Lower extremities are negative for pretibial pitting edema, deep calf tenderness, cyanosis or clubbing. Pelvic exam reveals normal appearing external female genitalia. No lesions are seen grossly. BUS is normal in appearance. Speculum exam is performed and reveals serous fluid in the vaginal vault. The cervix is difficult to visualize due to body habitus. Bimanual examination reveals fullness and and the cervix is replaced by hard exophytic mass, there is right parametrial thickening and the left side is difficult to assess on rectovaginal exam due to stool in the vault. No communication is noted between tissue planes.    ECOG = 1  0 - Asymptomatic (Fully active, able to carry on all predisease activities without restriction)  1 - Symptomatic but completely ambulatory (Restricted in physically strenuous activity but ambulatory and able to  carry out work of a light or sedentary nature. For example, light housework, office work)  2 - Symptomatic, <50% in bed during the day (Ambulatory and capable of all self care but unable to carry out any work activities. Up and about more than 50% of waking hours)  3 - Symptomatic, >50% in bed, but not bedbound (Capable of only limited self-care, confined to bed or chair 50% or more of waking hours)  4 - Bedbound (Completely disabled. Cannot carry on any self-care. Totally confined to bed or chair)  5 - Death   Eustace Pen MM, Creech RH, Tormey DC, et al. [phone]). "Toxicity and response criteria of the [facility] Group". [place] Oncol. 5 (6): 649-55    LABORATORY DATA:  Lab Results  Component Value Date   WBC 7.2 01/03/2018   HGB 10.3 (L) 01/03/2018   HCT 31.0 (L) 01/03/2018   MCV 87.3 01/03/2018   PLT 472 (H) 01/03/2018   Lab Results  Component Value Date   NA 132 (L) 01/03/2018   K 3.3 (L)  01/03/2018   CL 103 01/03/2018   CO2 24 01/03/2018   Lab Results  Component Value Date   ALT 16 01/03/2018   AST 20 01/03/2018   ALKPHOS 98 01/03/2018   BILITOT 0.6 01/03/2018      RADIOGRAPHY: US Pelvis Transvanginal Non-ob (tv Only)  Result Date: 01/04/2018 CLINICAL DATA:  52 year old female with pelvic mass/abscess seen on earlier CT. Abnormal vaginal bleeding. EXAM: TRANSABDOMINAL AND TRANSVAGINAL ULTRASOUND OF PELVIS TECHNIQUE: Both transabdominal and transvaginal ultrasound examinations of the pelvis were performed. Transabdominal technique was performed for global imaging of the pelvis including uterus, ovaries, adnexal regions, and pelvic cul-de-sac. It was necessary to proceed with endovaginal exam following the transabdominal exam to visualize the endometrium and ovaries. COMPARISON:  Abdominal CT dated 01/03/2018 FINDINGS: Uterus Measurements: 11.0 X 4.1 x 5.7 cm. The uterus is anteverted. There is a 3.6 x 2.5 x 3.2 cm anterior fundal fibroid. A 1.2 cm cystic  structure noted in the posterior myometrium. There is a 3.5 x 2.6 x 2.9 cm ill-defined and heterogeneous area of irregularity in the region of the cervix with vascularity. Endometrium Thickness: Suboptimally visualized and not well evaluated. Right ovary Measurements: Not visualized Left ovary Measurements: 3.5 x 2.5 x 2.0 cm. The left ovary is suboptimally visualized and not well evaluated. Other findings There is a 3.1 x 2.7 x 3.0 cm round complex mass with thick rim of soft tissue and central solid and cystic components in the region of the cul-de-sac and posterior to the lower uterus/vagina. Doppler demonstrates some vascularity within this mass without significant hyperemia. This corresponds to the necrotic mass/complex collection seen on the earlier CT in the region of the cervix. IMPRESSION: 1. Heterogeneous area of irregularity with Doppler flow in the region of the cervix. A cervical mass is not excluded. Clinical correlation is recommended. 2. Rounded complex mass/collection posterior to the lower uterus/vagina corresponding to the larger complex collection seen on the earlier CT. This may represent a necrotic or purulent collection. 3. Grossly unremarkable left ovary. Nonvisualization the right ovary. Electronically Signed   By: Anner Crete M.D.   On: 01/04/2018 01:21   US Pelvis Complete  Result Date: 01/04/2018 CLINICAL DATA:  52 year old female with pelvic mass/abscess seen on earlier CT. Abnormal vaginal bleeding. EXAM: TRANSABDOMINAL AND TRANSVAGINAL ULTRASOUND OF PELVIS TECHNIQUE: Both transabdominal and transvaginal ultrasound examinations of the pelvis were performed. Transabdominal technique was performed for global imaging of the pelvis including uterus, ovaries, adnexal regions, and pelvic cul-de-sac. It was necessary to proceed with endovaginal exam following the transabdominal exam to visualize the endometrium and ovaries. COMPARISON:  Abdominal CT dated 01/03/2018 FINDINGS: Uterus  Measurements: 11.0 X 4.1 x 5.7 cm. The uterus is anteverted. There is a 3.6 x 2.5 x 3.2 cm anterior fundal fibroid. A 1.2 cm cystic structure noted in the posterior myometrium. There is a 3.5 x 2.6 x 2.9 cm ill-defined and heterogeneous area of irregularity in the region of the cervix with vascularity. Endometrium Thickness: Suboptimally visualized and not well evaluated. Right ovary Measurements: Not visualized Left ovary Measurements: 3.5 x 2.5 x 2.0 cm. The left ovary is suboptimally visualized and not well evaluated. Other findings There is a 3.1 x 2.7 x 3.0 cm round complex mass with thick rim of soft tissue and central solid and cystic components in the region of the cul-de-sac and posterior to the lower uterus/vagina. Doppler demonstrates some vascularity within this mass without significant hyperemia. This corresponds to the necrotic mass/complex collection seen on the earlier CT  in the region of the cervix. IMPRESSION: 1. Heterogeneous area of irregularity with Doppler flow in the region of the cervix. A cervical mass is not excluded. Clinical correlation is recommended. 2. Rounded complex mass/collection posterior to the lower uterus/vagina corresponding to the larger complex collection seen on the earlier CT. This may represent a necrotic or purulent collection. 3. Grossly unremarkable left ovary. Nonvisualization the right ovary. Electronically Signed   By: Anner Crete M.D.   On: 01/04/2018 01:21   Ct Abdomen Pelvis W Contrast  Addendum Date: 01/03/2018   ADDENDUM REPORT: 01/03/2018 23:33 ADDENDUM: Please note the described 5.0 x 5.5 cm complex collection within the pelvis appears to be in the region of the cervix and may represent a necrotic mass/neoplasm. The described 3 x 3 cm low attenuating lesion to the left of the rectum may represent an abnormal lymph node. These findings were discussed with Dr. Elonda Husky at 11:30 p.m. 01/03/2018. Electronically Signed   By: Anner Crete M.D.   On:  01/03/2018 23:33   Result Date: 01/03/2018 CLINICAL DATA:  52 year old female with lower abdominal pain. EXAM: CT ABDOMEN AND PELVIS WITH CONTRAST TECHNIQUE: Multidetector CT imaging of the abdomen and pelvis was performed using the standard protocol following bolus administration of intravenous contrast. CONTRAST:  168m ISOVUE-300 IOPAMIDOL (ISOVUE-300) INJECTION 61%, <See Chart> ISOVUE-300 IOPAMIDOL (ISOVUE-300) INJECTION 61% COMPARISON:  Abdominal ultrasound dated 08/25/2016 FINDINGS: Lower chest: The visualized lung bases are clear. No intra-abdominal free air. Small amount of free fluid within pelvis. Hepatobiliary: Probable mild fatty infiltration of the liver. No intrahepatic biliary ductal dilatation. The gallbladder is unremarkable. Pancreas: Unremarkable. No pancreatic ductal dilatation or surrounding inflammatory changes. Spleen: Normal in size without focal abnormality. Adrenals/Urinary Tract: There is a 3.5 cm left renal cyst. The kidneys, visualized ureters, and urinary bladder are otherwise unremarkable. Stomach/Bowel: There is no bowel obstruction or active inflammation. The appendix is normal. Vascular/Lymphatic: The abdominal aorta and IVC appear unremarkable. No portal venous gas. A 18 mm somewhat rounded low attenuating structure along the right pelvic sidewall (series 2, image 64) likely represents an enlarged iliac chain lymph node. Reproductive: The uterus is anteverted. There is a 3 cm anterior uterine fibroid. The ovaries are poorly visualized. Other: There is a 5.0 x 5.5 cm complex collection with pockets of gas and thick enhancing rim in the lower pelvis somewhat inferior and posterior to the uterus and anterior to the rectosigmoid most consistent with an abscess. It is indeterminate whether there is combination between this collection and vagina. In addition there is a 3 x 3 cm rim enhancing fluid collection to the left of the rectum (series 2, image 73 also concerning for an abscess.  Correlation with clinical exam and further evaluation with pelvic ultrasound recommended. CT with IV and delayed oral and rectal contrast may provide additional information. Musculoskeletal: No acute or significant osseous findings. IMPRESSION: 1. Complex thick-walled fluid collection/abscess the lower pelvis with possible communication to the lower uterine/vagina. Clinical correlation and further evaluation with pelvic ultrasound recommended. CT with IV and delayed oral and rectal contrast may provide additional information. A smaller collection is also noted in the left posterior hemipelvis the left of the rectum. 2. No bowel obstruction or active inflammation.  Normal appendix. Electronically Signed: By: AAnner CreteM.D. On: 01/03/2018 22:25   Nm Pet Image Initial (pi) Skull Base To Thigh  Result Date: 01/22/2018 CLINICAL DATA:  Initial treatment strategy for cervical cancer. EXAM: NUCLEAR MEDICINE PET SKULL BASE TO THIGH TECHNIQUE: 10.7 mCi  F-18 FDG was injected intravenously. Full-ring PET imaging was performed from the skull base to thigh after the radiotracer. CT data was obtained and used for attenuation correction and anatomic localization. Fasting blood glucose: 118 mg/dl Mediastinal blood pool activity: SUV max 2.2 COMPARISON:  CT scan dated 01/03/2018 FINDINGS: NECK: Symmetric palatine tonsillar activity is likely physiologic. Scattered small level IIa, IIb, and V lymph nodes are present with low-grade activity. For example a left IIb lymph node on image 16/4 measures 6 mm in short axis and has a maximum SUV of 2.9 Bilateral mild diffuse thyroid activity is present. Incidental CT findings: None CHEST: Small bilateral axillary lymph nodes demonstrate low-grade activity. For example, a left axillary lymph node with fatty hilum measuring 0.8 cm in short axis on image 43/4 has a maximum SUV of 2.6. Accentuated activity in the distal esophagus, maximum SUV 6.7, nonspecific for physiologic activity  versus neoplastic disease. Incidental CT findings: Aortic arch atherosclerosis. Mild cardiomegaly. ABDOMEN/PELVIS: Widespread physiologic activity in the bowel. Left kidney upper pole photopenic lesion, compatible with a cyst. There are small hypermetabolic left common iliac, right pericaval, bilateral external iliac, right obturator, and right SFA lymph nodes observed. For example an indistinct left external iliac node measuring about 0.9 cm in short axis on image 145/4 has a maximum SUV of 5.6. Indistinct left common iliac nodal activity at 7.7. A small right operator node measures 6 mm in diameter on image 152/4 with maximum SUV 5.7 There is a large cervical mass with marked abnormal hypermetabolic activity, maximum SUV 24.7. The abnormal metabolic activity in this vicinity measures 5.4 by 4.8 cm Centrally necrotic tumor extending posteriorly from the cervical region into the perirectal space and abutting the rectal wall measuring about 3.4 cm in diameter, maximum SUV 13.9 Perirectal space lymph node 9 mm in short axis on image 147/4, maximum SUV 11.5 Indistinct accentuated activity in the vicinity of the right ovary, possibly from adjacent small lymph nodes. Incidental CT findings: Bilateral iliac artery atherosclerosis. Nodularity along the uterine fundus, probably a fibroid. SKELETON: Subtle marrow heterogeneity but without significant focal skeletal lesion observed. Activity along a right lower lumbar facet joint is thought to be degenerative given the underlying degenerative findings. Incidental CT findings: none IMPRESSION: 1. Large hypermetabolic cervical mass with local invasion into the perirectal space. Bilateral pelvic adenopathy noted with small but hypermetabolic pelvic lymph nodes compatible with malignancy. I do not see definite hypermetabolic adenopathy in the upper abdomen, but there are scattered small bilateral hypermetabolic lymph nodes in the neck and axillary regions of uncertain  significance. Given the low level activity and the skipped region in the abdomen, it is possible that the neck and chest lymph nodes are simply reactive, as usually I would expect to see more upper abdominal adenopathy were there involvement in the neck and chest. Surveillance is probably warranted. 2. Bilateral mild diffuse thyroid activity suggesting thyroiditis. 3. Other imaging findings of potential clinical significance: Aortic Atherosclerosis (ICD10-I70.0). Mild cardiomegaly. Bilateral iliac artery atherosclerosis. Suspected uterine fundal fibroid. Electronically Signed   By: Van Clines M.D.   On: 01/22/2018 12:41       IMPRESSION/PLAN: 1. Stage IIIB, squamous cell carcinoma of the cervix. Dr. Sondra Come discusses the pathology findings and reviews the nature of cervical cancer and HPV related disease. He discusses the  We discussed the risks, benefits, short, and long term effects of radiotherapy, and the patient is interested in proceeding. Dr. Sondra Come discusses the delivery and logistics of radiotherapy and anticipates a course  of 5 weeks as well as a 5 fraction boost, and follow with 5 tandem and ring treatments. Written consent is obtained and placed in the chart, a copy was provided to the patient. She will simulate tomorrow and begin therapy at the end of next week or early the following. 2. Pelvic pain secondary to #1. A new prescription for percocet was called into the pharmacy for the patient.  3. Breast cancer screening. We will coordinate a mammogram as the patient has not had screening.   The above documentation reflects my direct findings during this shared patient visit.    Carola Rhine, PAC  -----------------------------------  Blair Promise, PhD, MD

## 2018-01-25 ENCOUNTER — Encounter: Payer: Self-pay | Admitting: Hematology and Oncology

## 2018-01-25 ENCOUNTER — Other Ambulatory Visit: Payer: Self-pay | Admitting: Hematology and Oncology

## 2018-01-25 ENCOUNTER — Ambulatory Visit
Admission: RE | Admit: 2018-01-25 | Discharge: 2018-01-25 | Disposition: A | Discharge status: Home / Self Care | Payer: BLUE CROSS/BLUE SHIELD | Source: Ambulatory Visit | Attending: Radiation Oncology | Admitting: Radiation Oncology

## 2018-01-25 ENCOUNTER — Inpatient Hospital Stay (HOSPITAL_BASED_OUTPATIENT_CLINIC_OR_DEPARTMENT_OTHER): Payer: BLUE CROSS/BLUE SHIELD | Admitting: Hematology and Oncology

## 2018-01-25 VITALS — BP 152/93 | HR 102 | Temp 98.7°F

## 2018-01-25 VITALS — BP 135/95 | HR 85 | Temp 98.3°F | Resp 18 | Ht 61.0 in | Wt 220.8 lb

## 2018-01-25 DIAGNOSIS — R946 Abnormal results of thyroid function studies: Secondary | ICD-10-CM

## 2018-01-25 DIAGNOSIS — C530 Malignant neoplasm of endocervix: Secondary | ICD-10-CM | POA: Insufficient documentation

## 2018-01-25 DIAGNOSIS — C539 Malignant neoplasm of cervix uteri, unspecified: Secondary | ICD-10-CM

## 2018-01-25 DIAGNOSIS — G893 Neoplasm related pain (acute) (chronic): Secondary | ICD-10-CM | POA: Insufficient documentation

## 2018-01-25 DIAGNOSIS — E119 Type 2 diabetes mellitus without complications: Secondary | ICD-10-CM

## 2018-01-25 MED ORDER — SODIUM CHLORIDE 0.9 % IJ SOLN
10.0000 mL | Freq: Once | INTRAMUSCULAR | Status: AC
  Administered 2018-01-25: 10 mL via INTRAVENOUS

## 2018-01-25 NOTE — Assessment & Plan Note (Signed)
Recent PET CT scan show abnormal thyroid uptake I recommend thyroid function tests monitoring in her next blood draw

## 2018-01-25 NOTE — Progress Notes (Signed)
[place] CONSULT NOTE  Patient Care Team: Default, Provider, MD as PCP - General  ASSESSMENT & PLAN:  Cervical cancer [facility]) We discussed the role of concurrent chemo radiation therapy I will be recommending cisplatin as radiosensitizing agent She will be getting treatment once a week during radiation treatment for total of 5 doses I recommend port placement, chemo education class and chemo consent before we start treatment Unfortunately, due to scheduling issue, her first dose of chemotherapy would be around February 09, 2018 I plan to see her on a weekly basis for toxicity review  Abnormal thyroid uptake Recent PET CT scan show abnormal thyroid uptake I recommend thyroid function tests monitoring in her next blood draw  Cancer associated pain Her cancer associated pain is well controlled with current prescription oxycodone.  We will continue the same.  Diabetes mellitus without complication ([place]) She has recent diagnosis of diabetes She had no complication from diabetes We will monitor her blood sugar carefully during treatment.   Orders Placed This Encounter  Procedures  . IR FLUORO GUIDE PORT INSERTION RIGHT    Standing Status:   Future    Standing Expiration Date:   2019/04/01    Order Specific Question:   Reason for Exam (SYMPTOM  OR DIAGNOSIS REQUIRED)    Answer:   need port for chemo to start 4/5    Order Specific Question:   Preferred Imaging Location?    Answer:   [facility]  . TSH    Standing Status:   Future    Standing Expiration Date:   03/01/2019  . T4, free    Standing Status:   Future    Standing Expiration Date:   03/01/2019  . CBC with Differential/Platelet    Standing Status:   Standing    Number of Occurrences:   22    Standing Expiration Date:   01/26/2019  . Comprehensive metabolic panel    Standing Status:   Standing    Number of Occurrences:   22    Standing Expiration Date:   01/26/2019  . Magnesium    Standing Status:    Standing    Number of Occurrences:   22    Standing Expiration Date:   01/26/2019     CHIEF COMPLAINTS/PURPOSE OF CONSULTATION:  Locally advanced cervical cancer  HISTORY OF PRESENTING ILLNESS:  Misty Thomas 52 y.o. female is here because of recent diagnosis of cervical cancer Her husband, Misty Thomas is present The patient works as a Educational psychologist. The patient had 3 children.  She become menopausal around age 84 She has been having ongoing, recurrent vaginal discharge for almost a year Over the last 3 months, she started to have some suprapubic pain radiating to the back with frequent discharge and intermittent vaginal bleeding.  She was subsequently evaluated. I have reviewed her chart and materials related to her cancer extensively and collaborated history with the patient. Summary of oncologic history is as follows:   Cervical cancer ([place])   01/04/2017 Initial Diagnosis    She went to urgent care service for evaluation of abnormal vaginal symptoms with discharge and was diagnosed with bacterial vaginosis      01/03/2018 Imaging    1. Complex thick-walled fluid collection/abscess the lower pelvis with possible communication to the lower uterine/vagina. Clinical correlation and further evaluation with pelvic ultrasound recommended. CT with IV and delayed oral and rectal contrast may provide additional information. A smaller collection is also noted in the left posterior hemipelvis the  left of the rectum. 2. No bowel obstruction or active inflammation.  Normal appendix. Please note the described 5.0 x 5.5 cm complex collection within the pelvis appears to be in the region of the cervix and may represent a necrotic mass/neoplasm. The described 3 x 3 cm low attenuating lesion to the left of the rectum may represent an abnormal lymph node.       01/04/2018 Pathology Results    Cervix, biopsy - POORLY DIFFERENTIATED CARCINOMA - SEE COMMENT Microscopic Comment The biopsy material consists of  multiple fragments of tissue with infiltrative tumor and granulation tissue. By immunohistochemistry, the tumor is positive for cytokeratin 5/6, p63, p16 and vimentin (diffusely positive) but negative for ER. 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      01/04/2018 Imaging    Pelvic US 1. Heterogeneous area of irregularity with Doppler flow in the region of the cervix. A cervical mass is not excluded. Clinical correlation is recommended.  2. Rounded complex mass/collection posterior to the lower uterus/vagina corresponding to the larger complex collection seen on the earlier CT. This may represent a necrotic or purulent collection. 3. Grossly unremarkable left ovary. Nonvisualization the right ovary.      01/04/2018 Procedure    She underwent examination under anesthesia Procedure: pt taken to the operating room where gen anesthesia was performed. She was placed in the dorsal lithotomy position and prepped and draped in the usual sterile fashion. A bivalve speculum was placed in the pts vagina and a macerated cervix was noted. There was very little that resembled a normal cervix. The tissue was necrotic and malodorous.  Several biopsies were obtained. There was actually very little bleeding from the biopsy sites although there was bleeding from within the uterine cavity above the location of the cervix.         01/22/2018 PET scan    1. Large hypermetabolic cervical mass with local invasion into the perirectal space. Bilateral pelvic adenopathy noted with small but hypermetabolic pelvic lymph nodes compatible with malignancy. I do not see definite hypermetabolic adenopathy in the upper abdomen, but there are scattered small bilateral hypermetabolic lymph nodes in the neck and axillary regions of uncertain significance. Given the low level activity and the skipped region in the abdomen, it is possible that the neck and chest lymph nodes are simply  reactive, as usually I would expect to see more upper abdominal adenopathy were there involvement in the neck and chest. Surveillance is probably warranted. 2. Bilateral mild diffuse thyroid activity suggesting thyroiditis. 3. Other imaging findings of potential clinical significance: Aortic Atherosclerosis (ICD10-I70.0). Mild cardiomegaly. Bilateral iliac artery atherosclerosis. Suspected uterine fundal fibroid.       01/25/2018 Cancer Staging    Staging form: Cervix Uteri, AJCC 8th Edition - Clinical: FIGO Stage IVA (cT4, cN1, cM0) - Signed by [name], MD on 01/25/2018      Currently, her pain is well controlled with oxycodone She average 3 times a day oxycodone to control her pain She continues to have scant vaginal discharge with intermittent blood loss Her appetite is stable She had mild constipation, resolved with laxative She had no recent weight loss She has no peripheral neuropathy from diabetes  MEDICAL HISTORY:  Past Medical History:  Diagnosis Date  . Cervical cancer ([place])   . Diabetes mellitus without complication ([place])   . MRSA infection   . Sinusitis     SURGICAL HISTORY: Past Surgical History:  Procedure Laterality Date  . TUBAL  LIGATION      SOCIAL HISTORY: Social History   Socioeconomic History  . Marital status: Divorced    Spouse name: Misty Thomas  . Number of children: 3  . Years of education: Not on file  . Highest education level: Not on file  Occupational History  . Not on file  Social Needs  . Financial resource strain: Not on file  . Food insecurity:    Worry: Not on file    Inability: Not on file  . Transportation needs:    Medical: Not on file    Non-medical: Not on file  Tobacco Use  . Smoking status: Never Smoker  . Smokeless tobacco: Never Used  Substance and Sexual Activity  . Alcohol use: No    Frequency: Never    Comment: occasionally  . Drug use: No  . Sexual activity: Yes    Birth control/protection: Surgical  Lifestyle   . Physical activity:    Days per week: Not on file    Minutes per session: Not on file  . Stress: Not on file  Relationships  . Social connections:    Talks on phone: Not on file    Gets together: Not on file    Attends religious service: Not on file    Active member of club or organization: Not on file    Attends meetings of clubs or organizations: Not on file    Relationship status: Not on file  . Intimate partner violence:    Fear of current or ex partner: Not on file    Emotionally abused: Not on file    Physically abused: Not on file    Forced sexual activity: Not on file  Other Topics Concern  . Not on file  Social History Narrative  . Not on file    FAMILY HISTORY: Family History  Problem Relation Age of Onset  . Cancer Maternal Grandmother        unknown ca    ALLERGIES:  has No Known Allergies.  MEDICATIONS:  Current Outpatient Medications  Medication Sig Dispense Refill  . diclofenac (CATAFLAM) 50 MG tablet Take 1 tablet (50 mg total) by mouth 3 (three) times daily as needed. (Patient not taking: Reported on 01/25/2018) 60 tablet 1  . metFORMIN (GLUCOPHAGE) 500 MG tablet Take 250 mg by mouth 2 (two) times daily with a meal.    . Miconazole Nitrate 2 % OINT Apply small amount to corners of mouth 2 to 3 times a day (Patient not taking: Reported on 01/24/2018) 4 g 0  . Misc Natural Products (ESTROVEN + ENERGY MAX STRENGTH PO) Take 1 tablet by mouth daily.    Marland Kitchen oxyCODONE-acetaminophen (PERCOCET/ROXICET) 5-325 MG tablet Take 1-2 tablets by mouth every 6 (six) hours as needed for severe pain. 30 tablet 0   No current facility-administered medications for this visit.     REVIEW OF SYSTEMS:   Constitutional: Denies fevers, chills or abnormal night sweats Eyes: Denies blurriness of vision, double vision or watery eyes Ears, nose, mouth, throat, and face: Denies mucositis or sore throat Respiratory: Denies cough, dyspnea or wheezes Cardiovascular: Denies palpitation,  chest discomfort or lower extremity swelling Skin: Denies abnormal skin rashes Lymphatics: Denies new lymphadenopathy or easy bruising Neurological:Denies numbness, tingling or new weaknesses Behavioral/Psych: Mood is stable, no new changes  All other systems were reviewed with the patient and are negative.  PHYSICAL EXAMINATION: ECOG PERFORMANCE STATUS: 1 - Symptomatic but completely ambulatory  Vitals:   01/25/18 1415  BP: (!) 135/95  Pulse: 85  Resp: 18  Temp: 98.3 F (36.8 C)  SpO2: 100%   Filed Weights   01/25/18 1415  Weight: 220 lb 12.8 oz (100.2 kg)    GENERAL:alert, no distress and comfortable SKIN: skin color, texture, turgor are normal, no rashes or significant lesions EYES: normal, conjunctiva are pink and non-injected, sclera clear OROPHARYNX:no exudate, no erythema and lips, buccal mucosa, and tongue normal  NECK: supple, thyroid normal size, non-tender, without nodularity LYMPH:  no palpable lymphadenopathy in the cervical, axillary or inguinal LUNGS: clear to auscultation and percussion with normal breathing effort HEART: regular rate & rhythm and no murmurs and no lower extremity edema ABDOMEN:abdomen soft, non-tender and normal bowel sounds Musculoskeletal:no cyanosis of digits and no clubbing  PSYCH: alert & oriented x 3 with fluent speech NEURO: no focal motor/sensory deficits  LABORATORY DATA:  I have reviewed the data as listed Lab Results  Component Value Date   WBC 7.2 01/03/2018   HGB 10.3 (L) 01/03/2018   HCT 31.0 (L) 01/03/2018   MCV 87.3 01/03/2018   PLT 472 (H) 01/03/2018   Recent Labs    01/03/18 1837  NA 132*  K 3.3*  CL 103  CO2 24  GLUCOSE 134*  BUN 12  CREATININE 0.61  CALCIUM 8.5*  GFRNONAA >60  GFRAA >60  PROT 9.1*  ALBUMIN 3.3*  AST 20  ALT 16  ALKPHOS 98  BILITOT 0.6    RADIOGRAPHIC STUDIES: I have personally reviewed the radiological images as listed and agreed with the findings in the report. US Pelvis  Transvanginal Non-ob (tv Only)  Result Date: 01/04/2018 CLINICAL DATA:  52 year old female with pelvic mass/abscess seen on earlier CT. Abnormal vaginal bleeding. EXAM: TRANSABDOMINAL AND TRANSVAGINAL ULTRASOUND OF PELVIS TECHNIQUE: Both transabdominal and transvaginal ultrasound examinations of the pelvis were performed. Transabdominal technique was performed for global imaging of the pelvis including uterus, ovaries, adnexal regions, and pelvic cul-de-sac. It was necessary to proceed with endovaginal exam following the transabdominal exam to visualize the endometrium and ovaries. COMPARISON:  Abdominal CT dated 01/03/2018 FINDINGS: Uterus Measurements: 11.0 X 4.1 x 5.7 cm. The uterus is anteverted. There is a 3.6 x 2.5 x 3.2 cm anterior fundal fibroid. A 1.2 cm cystic structure noted in the posterior myometrium. There is a 3.5 x 2.6 x 2.9 cm ill-defined and heterogeneous area of irregularity in the region of the cervix with vascularity. Endometrium Thickness: Suboptimally visualized and not well evaluated. Right ovary Measurements: Not visualized Left ovary Measurements: 3.5 x 2.5 x 2.0 cm. The left ovary is suboptimally visualized and not well evaluated. Other findings There is a 3.1 x 2.7 x 3.0 cm round complex mass with thick rim of soft tissue and central solid and cystic components in the region of the cul-de-sac and posterior to the lower uterus/vagina. Doppler demonstrates some vascularity within this mass without significant hyperemia. This corresponds to the necrotic mass/complex collection seen on the earlier CT in the region of the cervix. IMPRESSION: 1. Heterogeneous area of irregularity with Doppler flow in the region of the cervix. A cervical mass is not excluded. Clinical correlation is recommended. 2. Rounded complex mass/collection posterior to the lower uterus/vagina corresponding to the larger complex collection seen on the earlier CT. This may represent a necrotic or purulent collection. 3.  Grossly unremarkable left ovary. Nonvisualization the right ovary. Electronically Signed   By: Anner Crete M.D.   On: 01/04/2018 01:21   US Pelvis Complete  Result Date: 01/04/2018 CLINICAL DATA:  52 year old female  with pelvic mass/abscess seen on earlier CT. Abnormal vaginal bleeding. EXAM: TRANSABDOMINAL AND TRANSVAGINAL ULTRASOUND OF PELVIS TECHNIQUE: Both transabdominal and transvaginal ultrasound examinations of the pelvis were performed. Transabdominal technique was performed for global imaging of the pelvis including uterus, ovaries, adnexal regions, and pelvic cul-de-sac. It was necessary to proceed with endovaginal exam following the transabdominal exam to visualize the endometrium and ovaries. COMPARISON:  Abdominal CT dated 01/03/2018 FINDINGS: Uterus Measurements: 11.0 X 4.1 x 5.7 cm. The uterus is anteverted. There is a 3.6 x 2.5 x 3.2 cm anterior fundal fibroid. A 1.2 cm cystic structure noted in the posterior myometrium. There is a 3.5 x 2.6 x 2.9 cm ill-defined and heterogeneous area of irregularity in the region of the cervix with vascularity. Endometrium Thickness: Suboptimally visualized and not well evaluated. Right ovary Measurements: Not visualized Left ovary Measurements: 3.5 x 2.5 x 2.0 cm. The left ovary is suboptimally visualized and not well evaluated. Other findings There is a 3.1 x 2.7 x 3.0 cm round complex mass with thick rim of soft tissue and central solid and cystic components in the region of the cul-de-sac and posterior to the lower uterus/vagina. Doppler demonstrates some vascularity within this mass without significant hyperemia. This corresponds to the necrotic mass/complex collection seen on the earlier CT in the region of the cervix. IMPRESSION: 1. Heterogeneous area of irregularity with Doppler flow in the region of the cervix. A cervical mass is not excluded. Clinical correlation is recommended. 2. Rounded complex mass/collection posterior to the lower  uterus/vagina corresponding to the larger complex collection seen on the earlier CT. This may represent a necrotic or purulent collection. 3. Grossly unremarkable left ovary. Nonvisualization the right ovary. Electronically Signed   By: Anner Crete M.D.   On: 01/04/2018 01:21   Ct Abdomen Pelvis W Contrast  Addendum Date: 01/03/2018   ADDENDUM REPORT: 01/03/2018 23:33 ADDENDUM: Please note the described 5.0 x 5.5 cm complex collection within the pelvis appears to be in the region of the cervix and may represent a necrotic mass/neoplasm. The described 3 x 3 cm low attenuating lesion to the left of the rectum may represent an abnormal lymph node. These findings were discussed with Dr. Elonda Husky at 11:30 p.m. 01/03/2018. Electronically Signed   By: Anner Crete M.D.   On: 01/03/2018 23:33   Result Date: 01/03/2018 CLINICAL DATA:  52 year old female with lower abdominal pain. EXAM: CT ABDOMEN AND PELVIS WITH CONTRAST TECHNIQUE: Multidetector CT imaging of the abdomen and pelvis was performed using the standard protocol following bolus administration of intravenous contrast. CONTRAST:  1107mL ISOVUE-300 IOPAMIDOL (ISOVUE-300) INJECTION 61%, <See Chart> ISOVUE-300 IOPAMIDOL (ISOVUE-300) INJECTION 61% COMPARISON:  Abdominal ultrasound dated 08/25/2016 FINDINGS: Lower chest: The visualized lung bases are clear. No intra-abdominal free air. Small amount of free fluid within pelvis. Hepatobiliary: Probable mild fatty infiltration of the liver. No intrahepatic biliary ductal dilatation. The gallbladder is unremarkable. Pancreas: Unremarkable. No pancreatic ductal dilatation or surrounding inflammatory changes. Spleen: Normal in size without focal abnormality. Adrenals/Urinary Tract: There is a 3.5 cm left renal cyst. The kidneys, visualized ureters, and urinary bladder are otherwise unremarkable. Stomach/Bowel: There is no bowel obstruction or active inflammation. The appendix is normal. Vascular/Lymphatic: The  abdominal aorta and IVC appear unremarkable. No portal venous gas. A 18 mm somewhat rounded low attenuating structure along the right pelvic sidewall (series 2, image 64) likely represents an enlarged iliac chain lymph node. Reproductive: The uterus is anteverted. There is a 3 cm anterior uterine fibroid. The ovaries are  poorly visualized. Other: There is a 5.0 x 5.5 cm complex collection with pockets of gas and thick enhancing rim in the lower pelvis somewhat inferior and posterior to the uterus and anterior to the rectosigmoid most consistent with an abscess. It is indeterminate whether there is combination between this collection and vagina. In addition there is a 3 x 3 cm rim enhancing fluid collection to the left of the rectum (series 2, image 73 also concerning for an abscess. Correlation with clinical exam and further evaluation with pelvic ultrasound recommended. CT with IV and delayed oral and rectal contrast may provide additional information. Musculoskeletal: No acute or significant osseous findings. IMPRESSION: 1. Complex thick-walled fluid collection/abscess the lower pelvis with possible communication to the lower uterine/vagina. Clinical correlation and further evaluation with pelvic ultrasound recommended. CT with IV and delayed oral and rectal contrast may provide additional information. A smaller collection is also noted in the left posterior hemipelvis the left of the rectum. 2. No bowel obstruction or active inflammation.  Normal appendix. Electronically Signed: By: Anner Crete M.D. On: 01/03/2018 22:25   Nm Pet Image Initial (pi) Skull Base To Thigh  Result Date: 01/22/2018 CLINICAL DATA:  Initial treatment strategy for cervical cancer. EXAM: NUCLEAR MEDICINE PET SKULL BASE TO THIGH TECHNIQUE: 10.7 mCi F-18 FDG was injected intravenously. Full-ring PET imaging was performed from the skull base to thigh after the radiotracer. CT data was obtained and used for attenuation correction and  anatomic localization. Fasting blood glucose: 118 mg/dl Mediastinal blood pool activity: SUV max 2.2 COMPARISON:  CT scan dated 01/03/2018 FINDINGS: NECK: Symmetric palatine tonsillar activity is likely physiologic. Scattered small level IIa, IIb, and V lymph nodes are present with low-grade activity. For example a left IIb lymph node on image 16/4 measures 6 mm in short axis and has a maximum SUV of 2.9 Bilateral mild diffuse thyroid activity is present. Incidental CT findings: None CHEST: Small bilateral axillary lymph nodes demonstrate low-grade activity. For example, a left axillary lymph node with fatty hilum measuring 0.8 cm in short axis on image 43/4 has a maximum SUV of 2.6. Accentuated activity in the distal esophagus, maximum SUV 6.7, nonspecific for physiologic activity versus neoplastic disease. Incidental CT findings: Aortic arch atherosclerosis. Mild cardiomegaly. ABDOMEN/PELVIS: Widespread physiologic activity in the bowel. Left kidney upper pole photopenic lesion, compatible with a cyst. There are small hypermetabolic left common iliac, right pericaval, bilateral external iliac, right obturator, and right SFA lymph nodes observed. For example an indistinct left external iliac node measuring about 0.9 cm in short axis on image 145/4 has a maximum SUV of 5.6. Indistinct left common iliac nodal activity at 7.7. A small right operator node measures 6 mm in diameter on image 152/4 with maximum SUV 5.7 There is a large cervical mass with marked abnormal hypermetabolic activity, maximum SUV 24.7. The abnormal metabolic activity in this vicinity measures 5.4 by 4.8 cm Centrally necrotic tumor extending posteriorly from the cervical region into the perirectal space and abutting the rectal wall measuring about 3.4 cm in diameter, maximum SUV 13.9 Perirectal space lymph node 9 mm in short axis on image 147/4, maximum SUV 11.5 Indistinct accentuated activity in the vicinity of the right ovary, possibly from  adjacent small lymph nodes. Incidental CT findings: Bilateral iliac artery atherosclerosis. Nodularity along the uterine fundus, probably a fibroid. SKELETON: Subtle marrow heterogeneity but without significant focal skeletal lesion observed. Activity along a right lower lumbar facet joint is thought to be degenerative given the underlying degenerative  findings. Incidental CT findings: none IMPRESSION: 1. Large hypermetabolic cervical mass with local invasion into the perirectal space. Bilateral pelvic adenopathy noted with small but hypermetabolic pelvic lymph nodes compatible with malignancy. I do not see definite hypermetabolic adenopathy in the upper abdomen, but there are scattered small bilateral hypermetabolic lymph nodes in the neck and axillary regions of uncertain significance. Given the low level activity and the skipped region in the abdomen, it is possible that the neck and chest lymph nodes are simply reactive, as usually I would expect to see more upper abdominal adenopathy were there involvement in the neck and chest. Surveillance is probably warranted. 2. Bilateral mild diffuse thyroid activity suggesting thyroiditis. 3. Other imaging findings of potential clinical significance: Aortic Atherosclerosis (ICD10-I70.0). Mild cardiomegaly. Bilateral iliac artery atherosclerosis. Suspected uterine fundal fibroid. Electronically Signed   By: Van Clines M.D.   On: 01/22/2018 12:41    I spent 55 minutes counseling the patient face to face. The total time spent in the appointment was 60 minutes and more than 50% was on counseling.  All questions were answered. The patient knows to call the clinic with any problems, questions or concerns.  [name], MD 01/25/2018 3:49 PM

## 2018-01-25 NOTE — Assessment & Plan Note (Signed)
She has recent diagnosis of diabetes She had no complication from diabetes We will monitor her blood sugar carefully during treatment.

## 2018-01-25 NOTE — Progress Notes (Signed)
START OFF PATHWAY REGIMEN - [Other Dx]   [id number]:Cisplatin 40 mg/m2 weekly (4 weeks per order sheet):   Administer weekly:     Cisplatin   **Always confirm dose/schedule in your pharmacy ordering system**    Patient Characteristics: Intent of Therapy: Curative Intent, Discussed with Patient

## 2018-01-25 NOTE — Progress Notes (Signed)
Has armband been applied?  Yes.    Does patient have an allergy to IV contrast dye?: No.   Has patient ever received premedication for IV contrast dye?: No.   Does patient take metformin?: Yes.    If patient does take metformin when was the last dose: January 24, 2018  Date of lab work: January 03, 2018 BUN: 12 CR: 0.61 GFR: >60  IV site: antecubital right, condition patent and no redness  Has IV site been added to flowsheet?  Yes.     BP (!) 152/93 (BP Location: Left Arm, Patient Position: Sitting)   Pulse (!) 102   Temp 98.7 F (37.1 C) (Oral)   SpO2 100%

## 2018-01-25 NOTE — Progress Notes (Signed)
Patient has labs ordered for 12/01/2017 @ 1:00pm. Patient was told not take metformin until she is notified that her lab work is ok.

## 2018-01-25 NOTE — Assessment & Plan Note (Signed)
Her cancer associated pain is well controlled with current prescription oxycodone.  We will continue the same.

## 2018-01-25 NOTE — Assessment & Plan Note (Signed)
We discussed the role of concurrent chemo radiation therapy I will be recommending cisplatin as radiosensitizing agent She will be getting treatment once a week during radiation treatment for total of 5 doses I recommend port placement, chemo education class and chemo consent before we start treatment Unfortunately, due to scheduling issue, her first dose of chemotherapy would be around February 09, 2018 I plan to see her on a weekly basis for toxicity review

## 2018-01-26 ENCOUNTER — Telehealth: Payer: Self-pay | Admitting: Hematology and Oncology

## 2018-01-26 NOTE — Telephone Encounter (Signed)
Spoke to patient regarding upcoming march and April appointments.  °

## 2018-01-29 ENCOUNTER — Telehealth: Payer: Self-pay | Admitting: Oncology

## 2018-01-29 ENCOUNTER — Other Ambulatory Visit: Payer: Self-pay | Admitting: Hematology and Oncology

## 2018-01-29 ENCOUNTER — Ambulatory Visit
Admission: RE | Admit: 2018-01-29 | Discharge: 2018-01-29 | Disposition: A | Discharge status: Home / Self Care | Payer: BLUE CROSS/BLUE SHIELD | Source: Ambulatory Visit | Attending: Radiation Oncology | Admitting: Radiation Oncology

## 2018-01-29 DIAGNOSIS — C530 Malignant neoplasm of endocervix: Secondary | ICD-10-CM | POA: Diagnosis not present

## 2018-01-29 LAB — BUN & CREATININE (CHCC)
BUN: 8 mg/dL (ref 7–26)
CREATININE: 0.74 mg/dL (ref 0.60–1.10)
GFR, Est AFR Am: >60 mL/min (ref >60)
GFR, Estimated: >60 mL/min (ref >60)

## 2018-01-29 MED ORDER — OXYCODONE-ACETAMINOPHEN 5-325 MG PO TABS: 1.0000 | ORAL_TABLET | Freq: Four times a day (QID) | ORAL | 0 refills | Status: DC | PRN

## 2018-01-29 NOTE — Telephone Encounter (Signed)
Called patient and advised her that she can start taking Metformin again today.  She verbalized agreement.

## 2018-01-31 ENCOUNTER — Other Ambulatory Visit: Payer: Self-pay | Admitting: Radiology

## 2018-01-31 DIAGNOSIS — C530 Malignant neoplasm of endocervix: Secondary | ICD-10-CM | POA: Diagnosis not present

## 2018-02-01 ENCOUNTER — Encounter (HOSPITAL_COMMUNITY): Payer: Self-pay

## 2018-02-01 ENCOUNTER — Ambulatory Visit (HOSPITAL_COMMUNITY)
Admission: RE | Admit: 2018-02-01 | Discharge: 2018-02-01 | Disposition: A | Discharge status: Home / Self Care | Payer: BLUE CROSS/BLUE SHIELD | Source: Ambulatory Visit | Attending: Hematology and Oncology | Admitting: Hematology and Oncology

## 2018-02-01 ENCOUNTER — Other Ambulatory Visit: Payer: Self-pay | Admitting: Student

## 2018-02-01 DIAGNOSIS — Z7984 Long term (current) use of oral hypoglycemic drugs: Secondary | ICD-10-CM | POA: Diagnosis not present

## 2018-02-01 DIAGNOSIS — C530 Malignant neoplasm of endocervix: Secondary | ICD-10-CM | POA: Insufficient documentation

## 2018-02-01 DIAGNOSIS — Z808 Family history of malignant neoplasm of other organs or systems: Secondary | ICD-10-CM | POA: Diagnosis not present

## 2018-02-01 DIAGNOSIS — Z9851 Tubal ligation status: Secondary | ICD-10-CM | POA: Diagnosis not present

## 2018-02-01 DIAGNOSIS — E119 Type 2 diabetes mellitus without complications: Secondary | ICD-10-CM | POA: Insufficient documentation

## 2018-02-01 HISTORY — PX: IR FLUORO GUIDE PORT INSERTION RIGHT: IMG5741

## 2018-02-01 LAB — BASIC METABOLIC PANEL
ANION GAP: 8 (ref 5–15)
BUN: 7 mg/dL (ref 6–20)
CO2: 27 mmol/L (ref 22–32)
Calcium: 8.9 mg/dL (ref 8.9–10.3)
Chloride: 101 mmol/L (ref 101–111)
Creatinine, Ser: 0.7 mg/dL (ref 0.44–1.00)
GFR calc Af Amer: >60 mL/min (ref >60)
GFR calc non Af Amer: >60 mL/min (ref >60)
GLUCOSE: 129 mg/dL — AB (ref 65–99)
Potassium: 3.2 mmol/L — ABNORMAL LOW (ref 3.5–5.1)
Sodium: 136 mmol/L (ref 135–145)

## 2018-02-01 LAB — CBC
HEMATOCRIT: 33.1 % — AB (ref 36.0–46.0)
HEMOGLOBIN: 10.5 g/dL — AB (ref 12.0–15.0)
MCH: 27.6 pg (ref 26.0–34.0)
MCHC: 31.7 g/dL (ref 30.0–36.0)
MCV: 87.1 fL (ref 78.0–100.0)
Platelets: 476 10*3/uL — ABNORMAL HIGH (ref 150–400)
RBC: 3.8 MIL/uL — ABNORMAL LOW (ref 3.87–5.11)
RDW: 13.4 % (ref 11.5–15.5)
WBC: 7.5 10*3/uL (ref 4.0–10.5)

## 2018-02-01 LAB — PROTIME-INR
INR: 1.03
Prothrombin Time: 13.4 seconds (ref 11.4–15.2)

## 2018-02-01 MED ORDER — MIDAZOLAM HCL 2 MG/2ML IJ SOLN
INTRAMUSCULAR | Status: AC | PRN
  Administered 2018-02-01 (×4): 1 mg via INTRAVENOUS

## 2018-02-01 MED ORDER — CEFAZOLIN SODIUM-DEXTROSE 2-4 GM/100ML-% IV SOLN
INTRAVENOUS | Status: AC
  Filled 2018-02-01: qty 100

## 2018-02-01 MED ORDER — SODIUM CHLORIDE 0.9 % IV SOLN
INTRAVENOUS | Status: DC
  Administered 2018-02-01: 09:00:00 via INTRAVENOUS

## 2018-02-01 MED ORDER — MIDAZOLAM HCL 2 MG/2ML IJ SOLN
INTRAMUSCULAR | Status: AC
  Filled 2018-02-01: qty 4

## 2018-02-01 MED ORDER — LIDOCAINE HCL 1 % IJ SOLN
INTRAMUSCULAR | Status: AC
  Filled 2018-02-01: qty 20

## 2018-02-01 MED ORDER — LIDOCAINE HCL (PF) 1 % IJ SOLN
INTRAMUSCULAR | Status: AC | PRN
  Administered 2018-02-01: 10 mL

## 2018-02-01 MED ORDER — HEPARIN SOD (PORK) LOCK FLUSH 100 UNIT/ML IV SOLN
INTRAVENOUS | Status: AC
  Administered 2018-02-01: 500 [IU]
  Filled 2018-02-01: qty 5

## 2018-02-01 MED ORDER — FENTANYL CITRATE (PF) 100 MCG/2ML IJ SOLN
INTRAMUSCULAR | Status: AC | PRN
  Administered 2018-02-01 (×2): 50 ug via INTRAVENOUS

## 2018-02-01 MED ORDER — CEFAZOLIN SODIUM-DEXTROSE 2-4 GM/100ML-% IV SOLN
2.0000 g | INTRAVENOUS | Status: AC
Start: 2018-02-01 — End: 2018-02-01
  Administered 2018-02-01: 2 g via INTRAVENOUS

## 2018-02-01 MED ORDER — FENTANYL CITRATE (PF) 100 MCG/2ML IJ SOLN
INTRAMUSCULAR | Status: AC
  Filled 2018-02-01: qty 4

## 2018-02-01 NOTE — Procedures (Signed)
RIJV PAC SVC RA EBL 0 Comp 0 

## 2018-02-01 NOTE — Sedation Documentation (Signed)
Patient is resting comfortably. 

## 2018-02-01 NOTE — Discharge Instructions (Signed)
Implanted Port Insertion, Care After °This sheet gives you information about how to care for yourself after your procedure. Your health care provider may also give you more specific instructions. If you have problems or questions, contact your health care provider. °What can I expect after the procedure? °After your procedure, it is common to have: °· Discomfort at the port insertion site. °· Bruising on the skin over the port. This should improve over 3-4 days. ° °Follow these instructions at home: °Port care °· After your port is placed, you will get a manufacturer's information card. The card has information about your port. Keep this card with you at all times. °· Take care of the port as told by your health care provider. Ask your health care provider if you or a family member can get training for taking care of the port at home. A home health care nurse may also take care of the port. °· Make sure to remember what type of port you have. °Incision care °· Follow instructions from your health care provider about how to take care of your port insertion site. Make sure you: °? Wash your hands with soap and water before you change your bandage (dressing). If soap and water are not available, use hand sanitizer. °? Change your dressing as told by your health care provider. °? Leave stitches (sutures), skin glue, or adhesive strips in place. These skin closures may need to stay in place for 2 weeks or longer. If adhesive strip edges start to loosen and curl up, you may trim the loose edges. Do not remove adhesive strips completely unless your health care provider tells you to do that. °· Check your port insertion site every day for signs of infection. Check for: °? More redness, swelling, or pain. °? More fluid or blood. °? Warmth. °? Pus or a bad smell. °General instructions °· Do not take baths, swim, or use a hot tub until your health care provider approves. °· Do not lift anything that is heavier than 10 lb (4.5  kg) for a week, or as told by your health care provider. °· Ask your health care provider when it is okay to: °? Return to work or school. °? Resume usual physical activities or sports. °· Do not drive for 24 hours if you were given a medicine to help you relax (sedative). °· Take over-the-counter and prescription medicines only as told by your health care provider. °· Wear a medical alert bracelet in case of an emergency. This will tell any health care providers that you have a port. °· Keep all follow-up visits as told by your health care provider. This is important. °Contact a health care provider if: °· You cannot flush your port with saline as directed, or you cannot draw blood from the port. °· You have a fever or chills. °· You have more redness, swelling, or pain around your port insertion site. °· You have more fluid or blood coming from your port insertion site. °· Your port insertion site feels warm to the touch. °· You have pus or a bad smell coming from the port insertion site. °Get help right away if: °· You have chest pain or shortness of breath. °· You have bleeding from your port that you cannot control. °Summary °· Take care of the port as told by your health care provider. °· Change your dressing as told by your health care provider. °· Keep all follow-up visits as told by your health care provider. °  This information is not intended to replace advice given to you by your health care provider. Make sure you discuss any questions you have with your health care provider. °Document Released: 08/14/2013 Document Revised: 09/14/2016 Document Reviewed: 09/14/2016 °[employer] Interactive Patient Education © 2017 [employer] °Moderate Conscious Sedation, Adult, Care After °These instructions provide you with information about caring for yourself after your procedure. Your health care provider may also give you more specific instructions. Your treatment has been planned according to current medical  practices, but problems sometimes occur. Call your health care provider if you have any problems or questions after your procedure. °What can I expect after the procedure? °After your procedure, it is common: °· To feel sleepy for several hours. °· To feel clumsy and have poor balance for several hours. °· To have poor judgment for several hours. °· To vomit if you eat too soon. ° °Follow these instructions at home: °For at least 24 hours after the procedure: ° °· Do not: °? Participate in activities where you could fall or become injured. °? Drive. °? Use heavy machinery. °? Drink alcohol. °? Take sleeping pills or medicines that cause drowsiness. °? Make important decisions or sign legal documents. °? Take care of children on your own. °· Rest. °Eating and drinking °· Follow the diet recommended by your health care provider. °· If you vomit: °? Drink water, juice, or soup when you can drink without vomiting. °? Make sure you have little or no nausea before eating solid foods. °General instructions °· Have a responsible adult stay with you until you are awake and alert. °· Take over-the-counter and prescription medicines only as told by your health care provider. °· If you smoke, do not smoke without supervision. °· Keep all follow-up visits as told by your health care provider. This is important. °Contact a health care provider if: °· You keep feeling nauseous or you keep vomiting. °· You feel light-headed. °· You develop a rash. °· You have a fever. °Get help right away if: °· You have trouble breathing. °This information is not intended to replace advice given to you by your health care provider. Make sure you discuss any questions you have with your health care provider. °Document Released: 08/14/2013 Document Revised: 03/28/2016 Document Reviewed: 02/13/2016 °[employer] Interactive Patient Education © 2018 [employer] ° °

## 2018-02-01 NOTE — H&P (Signed)
Chief Complaint: Patient was seen in consultation today for cervical cancer  Referring Physician(s): Gorsuch,Ni  Supervising Physician: Marybelle Killings  Patient Status: [facility] - Out-pt  History of Present Illness: Misty Thomas is a 52 y.o. female with past medical history of DM who was recently diagnosed with cervical cancer. Patient now with plans for chemo radiation therapy.  IR consulted for Port-A-Cath placement at the request of Dr. Alvy Bimler.   Patient presents for procedure today in her usual state of health.  She has been NPO.  She does not take blood thinners.   Past Medical History:  Diagnosis Date  . Cervical cancer ([place])   . Diabetes mellitus without complication ([place])   . MRSA infection   . Sinusitis     Past Surgical History:  Procedure Laterality Date  . TUBAL LIGATION      Allergies: Patient has no known allergies.  Medications: Prior to Admission medications   Medication Sig Start Date End Date Taking? Authorizing Provider  docusate sodium (COLACE) 100 MG capsule Take 100 mg by mouth daily.   Yes [provider]  metFORMIN (GLUCOPHAGE) 500 MG tablet Take 250 mg by mouth 2 (two) times daily with a meal.   Yes [provider]  oxyCODONE-acetaminophen (PERCOCET/ROXICET) 5-325 MG tablet Take 1-2 tablets by mouth every 6 (six) hours as needed for severe pain. 01/29/18  Yes [name], MD  Misc Natural Products (ESTROVEN + ENERGY MAX STRENGTH PO) Take 1 tablet by mouth daily.    [provider]     Family History  Problem Relation Age of Onset  . Cancer Maternal Grandmother        unknown ca    Social History   Socioeconomic History  . Marital status: Divorced    Spouse name: [name]  . Number of children: 3  . Years of education: Not on file  . Highest education level: Not on file  Occupational History  . Not on file  Social Needs  . Financial resource strain: Not on file  . Food insecurity:    Worry: Not on file   Inability: Not on file  . Transportation needs:    Medical: Not on file    Non-medical: Not on file  Tobacco Use  . Smoking status: Never Smoker  . Smokeless tobacco: Never Used  Substance and Sexual Activity  . Alcohol use: No    Frequency: Never    Comment: occasionally  . Drug use: No  . Sexual activity: Yes    Birth control/protection: Surgical  Lifestyle  . Physical activity:    Days per week: Not on file    Minutes per session: Not on file  . Stress: Not on file  Relationships  . Social connections:    Talks on phone: Not on file    Gets together: Not on file    Attends religious service: Not on file    Active member of club or organization: Not on file    Attends meetings of clubs or organizations: Not on file    Relationship status: Not on file  Other Topics Concern  . Not on file  Social History Narrative  . Not on file     Review of Systems: A 12 point ROS discussed and pertinent positives are indicated in the HPI above.  All other systems are negative.  Review of Systems  Constitutional: Negative for fatigue and fever.  Respiratory: Negative for cough and shortness of breath.   Cardiovascular: Negative for chest  pain.  Gastrointestinal: Positive for abdominal pain (since diagnosis).  Musculoskeletal: Negative for back pain.  Psychiatric/Behavioral: Negative for behavioral problems and confusion.    Vital Signs: BP (!) 129/92   Pulse 97   Temp 98 F (36.7 C) (Oral)   Ht 5\' 1"  (1.549 m)   Wt 223 lb (101.2 kg)   SpO2 98%   BMI 42.14 kg/m   Physical Exam  Constitutional: She is oriented to person, place, and time. She appears well-developed.  Cardiovascular: Normal rate, regular rhythm and normal heart sounds.  Pulmonary/Chest: Effort normal and breath sounds normal. No respiratory distress.  Abdominal: Soft. There is no tenderness.  Neurological: She is alert and oriented to person, place, and time.  Skin: Skin is warm and dry.  Psychiatric: She  has a normal mood and affect. Her behavior is normal. Judgment and thought content normal.  Nursing note and vitals reviewed.    MD Evaluation Airway: WNL Heart: WNL Abdomen: WNL Chest/ Lungs: WNL ASA  Classification: 3 Mallampati/Airway Score: One   Imaging: US Pelvis Transvanginal Non-ob (tv Only)  Result Date: 01/04/2018 CLINICAL DATA:  52 year old female with pelvic mass/abscess seen on earlier CT. Abnormal vaginal bleeding. EXAM: TRANSABDOMINAL AND TRANSVAGINAL ULTRASOUND OF PELVIS TECHNIQUE: Both transabdominal and transvaginal ultrasound examinations of the pelvis were performed. Transabdominal technique was performed for global imaging of the pelvis including uterus, ovaries, adnexal regions, and pelvic cul-de-sac. It was necessary to proceed with endovaginal exam following the transabdominal exam to visualize the endometrium and ovaries. COMPARISON:  Abdominal CT dated 01/03/2018 FINDINGS: Uterus Measurements: 11.0 X 4.1 x 5.7 cm. The uterus is anteverted. There is a 3.6 x 2.5 x 3.2 cm anterior fundal fibroid. A 1.2 cm cystic structure noted in the posterior myometrium. There is a 3.5 x 2.6 x 2.9 cm ill-defined and heterogeneous area of irregularity in the region of the cervix with vascularity. Endometrium Thickness: Suboptimally visualized and not well evaluated. Right ovary Measurements: Not visualized Left ovary Measurements: 3.5 x 2.5 x 2.0 cm. The left ovary is suboptimally visualized and not well evaluated. Other findings There is a 3.1 x 2.7 x 3.0 cm round complex mass with thick rim of soft tissue and central solid and cystic components in the region of the cul-de-sac and posterior to the lower uterus/vagina. Doppler demonstrates some vascularity within this mass without significant hyperemia. This corresponds to the necrotic mass/complex collection seen on the earlier CT in the region of the cervix. IMPRESSION: 1. Heterogeneous area of irregularity with Doppler flow in the region  of the cervix. A cervical mass is not excluded. Clinical correlation is recommended. 2. Rounded complex mass/collection posterior to the lower uterus/vagina corresponding to the larger complex collection seen on the earlier CT. This may represent a necrotic or purulent collection. 3. Grossly unremarkable left ovary. Nonvisualization the right ovary. Electronically Signed   By: Anner Crete M.D.   On: 01/04/2018 01:21   US Pelvis Complete  Result Date: 01/04/2018 CLINICAL DATA:  52 year old female with pelvic mass/abscess seen on earlier CT. Abnormal vaginal bleeding. EXAM: TRANSABDOMINAL AND TRANSVAGINAL ULTRASOUND OF PELVIS TECHNIQUE: Both transabdominal and transvaginal ultrasound examinations of the pelvis were performed. Transabdominal technique was performed for global imaging of the pelvis including uterus, ovaries, adnexal regions, and pelvic cul-de-sac. It was necessary to proceed with endovaginal exam following the transabdominal exam to visualize the endometrium and ovaries. COMPARISON:  Abdominal CT dated 01/03/2018 FINDINGS: Uterus Measurements: 11.0 X 4.1 x 5.7 cm. The uterus is anteverted. There is a 3.6  x 2.5 x 3.2 cm anterior fundal fibroid. A 1.2 cm cystic structure noted in the posterior myometrium. There is a 3.5 x 2.6 x 2.9 cm ill-defined and heterogeneous area of irregularity in the region of the cervix with vascularity. Endometrium Thickness: Suboptimally visualized and not well evaluated. Right ovary Measurements: Not visualized Left ovary Measurements: 3.5 x 2.5 x 2.0 cm. The left ovary is suboptimally visualized and not well evaluated. Other findings There is a 3.1 x 2.7 x 3.0 cm round complex mass with thick rim of soft tissue and central solid and cystic components in the region of the cul-de-sac and posterior to the lower uterus/vagina. Doppler demonstrates some vascularity within this mass without significant hyperemia. This corresponds to the necrotic mass/complex collection  seen on the earlier CT in the region of the cervix. IMPRESSION: 1. Heterogeneous area of irregularity with Doppler flow in the region of the cervix. A cervical mass is not excluded. Clinical correlation is recommended. 2. Rounded complex mass/collection posterior to the lower uterus/vagina corresponding to the larger complex collection seen on the earlier CT. This may represent a necrotic or purulent collection. 3. Grossly unremarkable left ovary. Nonvisualization the right ovary. Electronically Signed   By: Anner Crete M.D.   On: 01/04/2018 01:21   Ct Abdomen Pelvis W Contrast  Addendum Date: 01/03/2018   ADDENDUM REPORT: 01/03/2018 23:33 ADDENDUM: Please note the described 5.0 x 5.5 cm complex collection within the pelvis appears to be in the region of the cervix and may represent a necrotic mass/neoplasm. The described 3 x 3 cm low attenuating lesion to the left of the rectum may represent an abnormal lymph node. These findings were discussed with Dr. Elonda Husky at 11:30 p.m. 01/03/2018. Electronically Signed   By: Anner Crete M.D.   On: 01/03/2018 23:33   Result Date: 01/03/2018 CLINICAL DATA:  52 year old female with lower abdominal pain. EXAM: CT ABDOMEN AND PELVIS WITH CONTRAST TECHNIQUE: Multidetector CT imaging of the abdomen and pelvis was performed using the standard protocol following bolus administration of intravenous contrast. CONTRAST:  165mL ISOVUE-300 IOPAMIDOL (ISOVUE-300) INJECTION 61%, <See Chart> ISOVUE-300 IOPAMIDOL (ISOVUE-300) INJECTION 61% COMPARISON:  Abdominal ultrasound dated 08/25/2016 FINDINGS: Lower chest: The visualized lung bases are clear. No intra-abdominal free air. Small amount of free fluid within pelvis. Hepatobiliary: Probable mild fatty infiltration of the liver. No intrahepatic biliary ductal dilatation. The gallbladder is unremarkable. Pancreas: Unremarkable. No pancreatic ductal dilatation or surrounding inflammatory changes. Spleen: Normal in size without focal  abnormality. Adrenals/Urinary Tract: There is a 3.5 cm left renal cyst. The kidneys, visualized ureters, and urinary bladder are otherwise unremarkable. Stomach/Bowel: There is no bowel obstruction or active inflammation. The appendix is normal. Vascular/Lymphatic: The abdominal aorta and IVC appear unremarkable. No portal venous gas. A 18 mm somewhat rounded low attenuating structure along the right pelvic sidewall (series 2, image 64) likely represents an enlarged iliac chain lymph node. Reproductive: The uterus is anteverted. There is a 3 cm anterior uterine fibroid. The ovaries are poorly visualized. Other: There is a 5.0 x 5.5 cm complex collection with pockets of gas and thick enhancing rim in the lower pelvis somewhat inferior and posterior to the uterus and anterior to the rectosigmoid most consistent with an abscess. It is indeterminate whether there is combination between this collection and vagina. In addition there is a 3 x 3 cm rim enhancing fluid collection to the left of the rectum (series 2, image 73 also concerning for an abscess. Correlation with clinical exam and further evaluation  with pelvic ultrasound recommended. CT with IV and delayed oral and rectal contrast may provide additional information. Musculoskeletal: No acute or significant osseous findings. IMPRESSION: 1. Complex thick-walled fluid collection/abscess the lower pelvis with possible communication to the lower uterine/vagina. Clinical correlation and further evaluation with pelvic ultrasound recommended. CT with IV and delayed oral and rectal contrast may provide additional information. A smaller collection is also noted in the left posterior hemipelvis the left of the rectum. 2. No bowel obstruction or active inflammation.  Normal appendix. Electronically Signed: By: Anner Crete M.D. On: 01/03/2018 22:25   Nm Pet Image Initial (pi) Skull Base To Thigh  Result Date: 01/22/2018 CLINICAL DATA:  Initial treatment strategy for  cervical cancer. EXAM: NUCLEAR MEDICINE PET SKULL BASE TO THIGH TECHNIQUE: 10.7 mCi F-18 FDG was injected intravenously. Full-ring PET imaging was performed from the skull base to thigh after the radiotracer. CT data was obtained and used for attenuation correction and anatomic localization. Fasting blood glucose: 118 mg/dl Mediastinal blood pool activity: SUV max 2.2 COMPARISON:  CT scan dated 01/03/2018 FINDINGS: NECK: Symmetric palatine tonsillar activity is likely physiologic. Scattered small level IIa, IIb, and V lymph nodes are present with low-grade activity. For example a left IIb lymph node on image 16/4 measures 6 mm in short axis and has a maximum SUV of 2.9 Bilateral mild diffuse thyroid activity is present. Incidental CT findings: None CHEST: Small bilateral axillary lymph nodes demonstrate low-grade activity. For example, a left axillary lymph node with fatty hilum measuring 0.8 cm in short axis on image 43/4 has a maximum SUV of 2.6. Accentuated activity in the distal esophagus, maximum SUV 6.7, nonspecific for physiologic activity versus neoplastic disease. Incidental CT findings: Aortic arch atherosclerosis. Mild cardiomegaly. ABDOMEN/PELVIS: Widespread physiologic activity in the bowel. Left kidney upper pole photopenic lesion, compatible with a cyst. There are small hypermetabolic left common iliac, right pericaval, bilateral external iliac, right obturator, and right SFA lymph nodes observed. For example an indistinct left external iliac node measuring about 0.9 cm in short axis on image 145/4 has a maximum SUV of 5.6. Indistinct left common iliac nodal activity at 7.7. A small right operator node measures 6 mm in diameter on image 152/4 with maximum SUV 5.7 There is a large cervical mass with marked abnormal hypermetabolic activity, maximum SUV 24.7. The abnormal metabolic activity in this vicinity measures 5.4 by 4.8 cm Centrally necrotic tumor extending posteriorly from the cervical region  into the perirectal space and abutting the rectal wall measuring about 3.4 cm in diameter, maximum SUV 13.9 Perirectal space lymph node 9 mm in short axis on image 147/4, maximum SUV 11.5 Indistinct accentuated activity in the vicinity of the right ovary, possibly from adjacent small lymph nodes. Incidental CT findings: Bilateral iliac artery atherosclerosis. Nodularity along the uterine fundus, probably a fibroid. SKELETON: Subtle marrow heterogeneity but without significant focal skeletal lesion observed. Activity along a right lower lumbar facet joint is thought to be degenerative given the underlying degenerative findings. Incidental CT findings: none IMPRESSION: 1. Large hypermetabolic cervical mass with local invasion into the perirectal space. Bilateral pelvic adenopathy noted with small but hypermetabolic pelvic lymph nodes compatible with malignancy. I do not see definite hypermetabolic adenopathy in the upper abdomen, but there are scattered small bilateral hypermetabolic lymph nodes in the neck and axillary regions of uncertain significance. Given the low level activity and the skipped region in the abdomen, it is possible that the neck and chest lymph nodes are simply reactive, as usually  I would expect to see more upper abdominal adenopathy were there involvement in the neck and chest. Surveillance is probably warranted. 2. Bilateral mild diffuse thyroid activity suggesting thyroiditis. 3. Other imaging findings of potential clinical significance: Aortic Atherosclerosis (ICD10-I70.0). Mild cardiomegaly. Bilateral iliac artery atherosclerosis. Suspected uterine fundal fibroid. Electronically Signed   By: Van Clines M.D.   On: 01/22/2018 12:41    Labs:  CBC: Recent Labs    01/03/18 1837  WBC 7.2  HGB 10.3*  HCT 31.0*  PLT 472*    COAGS: No results for input(s): INR, APTT in the last 8760 hours.  BMP: Recent Labs    01/03/18 1837 01/29/18 1239  NA 132*  --   K 3.3*  --   CL  103  --   CO2 24  --   GLUCOSE 134*  --   BUN 12 8  CALCIUM 8.5*  --   CREATININE 0.61 0.74  GFRNONAA >60 >60  GFRAA >60 >60    LIVER FUNCTION TESTS: Recent Labs    01/03/18 1837  BILITOT 0.6  AST 20  ALT 16  ALKPHOS 98  PROT 9.1*  ALBUMIN 3.3*    TUMOR MARKERS: No results for input(s): AFPTM, CEA, CA199, CHROMGRNA in the last 8760 hours.  Assessment and Plan: Patient with past medical history of DM presents with complaint of cervical cancer and plans for upcoming chemo radiation therapy.  IR consulted for Port-A-Cath placement at the request of Dr. Alvy Bimler. Case reviewed by Dr. Barbie Banner who approves patient for procedure.  Patient presents today in their usual state of health.  She has been NPO and is not currently on blood thinners.   Risks and benefits of image guided port-a-catheter placement was discussed with the patient including, but not limited to bleeding, infection, pneumothorax, or fibrin sheath development and need for additional procedures.  All of the patient's questions were answered, patient is agreeable to proceed. Consent signed and in chart.  Thank you for this interesting consult.  I greatly enjoyed meeting Misty Thomas and look forward to participating in their care.  A copy of this report was sent to the requesting provider on this date.  Electronically Signed: Docia Barrier, PA 02/01/2018, 8:37 AM   I spent a total of  30 Minutes   in face to face in clinical consultation, greater than 50% of which was counseling/coordinating care for cervical cancer.

## 2018-02-04 NOTE — Progress Notes (Signed)
  Radiation Oncology         (336) [phone] ________________________________  Name: Misty Thomas MRN: [id number]  Date: 01/25/2018  DOB: May 04, 1966  SIMULATION AND TREATMENT PLANNING NOTE    ICD-10-CM   1. Malignant neoplasm of endocervix ([place]) C53.0     DIAGNOSIS: Stage IIIB, squamous cell carcinoma of the cervix.     NARRATIVE:  The patient was brought to the [facility].  Identity was confirmed.  All relevant records and images related to the planned course of therapy were reviewed.  The patient freely provided informed written consent to proceed with treatment after reviewing the details related to the planned course of therapy. The consent form was witnessed and verified by the simulation staff.  Then, the patient was set-up in a stable reproducible  supine position for radiation therapy.  CT images were obtained.  Surface markings were placed.  The CT images were loaded into the planning software.  Then the target and avoidance structures were contoured.  Treatment planning then occurred.  The radiation prescription was entered and confirmed.  Then, I designed and supervised the construction of a total of 5 medically necessary complex treatment devices.  I have requested : 3D Simulation  I have requested a DVH of the following structures: cervix, PTV, small bowel, bladder, rectum.  I have ordered:dose calc.  PLAN:  The patient will receive 45 Gy in 25 fractions directed at the pelvis and low periaortic region. Patient will then considered for possible sidewall boost of 9 gray in 5 fractions. She will also receive radiosensitizing chemotherapy during her external beam treatments. Treatments will then proceed with intracavitary brachytherapy treatments with iridium 192 as the high-dose-rate source. The patient will receive 5 high-dose rate brachytherapy treatments directed at the cervical region.  -----------------------------------  Blair Promise, PhD, MD

## 2018-02-05 ENCOUNTER — Telehealth: Payer: Self-pay

## 2018-02-05 ENCOUNTER — Ambulatory Visit
Admission: RE | Admit: 2018-02-05 | Discharge: 2018-02-05 | Disposition: A | Discharge status: Home / Self Care | Payer: BLUE CROSS/BLUE SHIELD | Source: Ambulatory Visit | Attending: Radiation Oncology | Admitting: Radiation Oncology

## 2018-02-05 ENCOUNTER — Other Ambulatory Visit: Payer: Self-pay | Admitting: Hematology and Oncology

## 2018-02-05 DIAGNOSIS — Z510 Encounter for antineoplastic radiation therapy: Secondary | ICD-10-CM | POA: Insufficient documentation

## 2018-02-05 DIAGNOSIS — C530 Malignant neoplasm of endocervix: Secondary | ICD-10-CM | POA: Insufficient documentation

## 2018-02-05 MED ORDER — OXYCODONE-ACETAMINOPHEN 5-325 MG PO TABS: 1.0000 | ORAL_TABLET | Freq: Four times a day (QID) | ORAL | 0 refills | Status: DC | PRN

## 2018-02-05 NOTE — Progress Notes (Signed)
  Radiation Oncology         (336) [phone] ________________________________  Name: Misty Thomas MRN: [id number]  Date: 02/05/2018  DOB: [birth year]  Simulation Verification Note    ICD-10-CM   1. Malignant neoplasm of endocervix ([place]) C53.0     Status: outpatient  NARRATIVE: The patient was brought to the treatment unit and placed in the planned treatment position. The clinical setup was verified. Then port films were obtained and uploaded to the radiation oncology medical record software.  The treatment beams were carefully compared against the planned radiation fields. The position location and shape of the radiation fields was reviewed. They targeted volume of tissue appears to be appropriately covered by the radiation beams. Organs at risk appear to be excluded as planned.  Based on my personal review, I approved the simulation verification. The patient's treatment will proceed as planned.  -----------------------------------  Blair Promise, PhD, MD

## 2018-02-05 NOTE — Telephone Encounter (Signed)
In lobby requesting refill on Percocet. Given Rx.

## 2018-02-06 ENCOUNTER — Other Ambulatory Visit: Payer: Self-pay | Admitting: Radiation Oncology

## 2018-02-06 ENCOUNTER — Ambulatory Visit
Admission: RE | Admit: 2018-02-06 | Discharge: 2018-02-06 | Disposition: A | Discharge status: Home / Self Care | Payer: BLUE CROSS/BLUE SHIELD | Source: Ambulatory Visit | Attending: Radiation Oncology | Admitting: Radiation Oncology

## 2018-02-06 MED ORDER — ONDANSETRON HCL 4 MG PO TABS: 4.0000 mg | ORAL_TABLET | Freq: Three times a day (TID) | ORAL | 0 refills | Status: DC | PRN

## 2018-02-06 NOTE — Progress Notes (Signed)
Pt here for patient teaching.  Pt given Radiation and You booklet.  Reviewed areas of pertinence such as diarrhea, fatigue, nausea and vomiting, sexual and fertility changes and skin changes . Pt able to give teach back of to pat skin, use unscented/gentle soap, have Imodium on hand and drink plenty of water,avoid applying anything to skin within 4 hours of treatment. Pt demonstrated understanding and verbalizes understanding of information given and will contact nursing with any questions or concerns.

## 2018-02-06 NOTE — Progress Notes (Signed)

## 2018-02-07 ENCOUNTER — Ambulatory Visit
Admission: RE | Admit: 2018-02-07 | Discharge: 2018-02-07 | Disposition: A | Discharge status: Home / Self Care | Payer: BLUE CROSS/BLUE SHIELD | Source: Ambulatory Visit | Attending: Radiation Oncology | Admitting: Radiation Oncology

## 2018-02-07 ENCOUNTER — Inpatient Hospital Stay: Payer: BLUE CROSS/BLUE SHIELD | Attending: Gynecologic Oncology

## 2018-02-07 DIAGNOSIS — R110 Nausea: Secondary | ICD-10-CM | POA: Insufficient documentation

## 2018-02-07 DIAGNOSIS — E119 Type 2 diabetes mellitus without complications: Secondary | ICD-10-CM | POA: Insufficient documentation

## 2018-02-07 DIAGNOSIS — D649 Anemia, unspecified: Secondary | ICD-10-CM | POA: Diagnosis not present

## 2018-02-07 DIAGNOSIS — E038 Other specified hypothyroidism: Secondary | ICD-10-CM | POA: Diagnosis not present

## 2018-02-07 DIAGNOSIS — Z5111 Encounter for antineoplastic chemotherapy: Secondary | ICD-10-CM | POA: Diagnosis not present

## 2018-02-07 DIAGNOSIS — Z21 Asymptomatic human immunodeficiency virus [HIV] infection status: Secondary | ICD-10-CM | POA: Diagnosis not present

## 2018-02-07 DIAGNOSIS — G893 Neoplasm related pain (acute) (chronic): Secondary | ICD-10-CM | POA: Insufficient documentation

## 2018-02-07 DIAGNOSIS — R197 Diarrhea, unspecified: Secondary | ICD-10-CM | POA: Diagnosis not present

## 2018-02-07 DIAGNOSIS — R946 Abnormal results of thyroid function studies: Secondary | ICD-10-CM

## 2018-02-07 DIAGNOSIS — B20 Human immunodeficiency virus [HIV] disease: Secondary | ICD-10-CM | POA: Insufficient documentation

## 2018-02-07 DIAGNOSIS — C530 Malignant neoplasm of endocervix: Secondary | ICD-10-CM | POA: Insufficient documentation

## 2018-02-07 LAB — COMPREHENSIVE METABOLIC PANEL
ALBUMIN: 3.4 g/dL — AB (ref 3.5–5.0)
ALK PHOS: 81 U/L (ref 40–150)
ALT: 13 U/L (ref 0–55)
ANION GAP: 7 (ref 3–11)
AST: 18 U/L (ref 5–34)
BILIRUBIN TOTAL: 0.5 mg/dL (ref 0.2–1.2)
BUN: 10 mg/dL (ref 7–26)
CALCIUM: 9.4 mg/dL (ref 8.4–10.4)
CO2: 26 mmol/L (ref 22–29)
CREATININE: 0.86 mg/dL (ref 0.60–1.10)
Chloride: 104 mmol/L (ref 98–109)
GFR calc Af Amer: >60 mL/min (ref >60)
GFR calc non Af Amer: >60 mL/min (ref >60)
GLUCOSE: 110 mg/dL (ref 70–140)
Potassium: 3.8 mmol/L (ref 3.5–5.1)
Sodium: 137 mmol/L (ref 136–145)
TOTAL PROTEIN: 9.5 g/dL — AB (ref 6.4–8.3)

## 2018-02-07 LAB — CBC WITH DIFFERENTIAL/PLATELET
BASOS ABS: 0 10*3/uL (ref 0.0–0.1)
BASOS PCT: 0 %
EOS ABS: 0 10*3/uL (ref 0.0–0.5)
EOS PCT: 0 %
HCT: 33.2 % — ABNORMAL LOW (ref 34.8–46.6)
Hemoglobin: 10.8 g/dL — ABNORMAL LOW (ref 11.6–15.9)
Lymphocytes Relative: 13 %
Lymphs Abs: 1.1 10*3/uL (ref 0.9–3.3)
MCH: 28.1 pg (ref 25.1–34.0)
MCHC: 32.5 g/dL (ref 31.5–36.0)
MCV: 86.2 fL (ref 79.5–101.0)
MONO ABS: 0.3 10*3/uL (ref 0.1–0.9)
MONOS PCT: 4 %
Neutro Abs: 7.1 10*3/uL — ABNORMAL HIGH (ref 1.5–6.5)
Neutrophils Relative %: 83 %
PLATELETS: 454 10*3/uL — AB (ref 145–400)
RBC: 3.85 MIL/uL (ref 3.70–5.45)
RDW: 13.6 % (ref 11.2–14.5)
WBC: 8.5 10*3/uL (ref 3.9–10.3)

## 2018-02-07 LAB — T4, FREE: FREE T4: 0.62 ng/dL (ref 0.61–1.12)

## 2018-02-07 LAB — TSH: TSH: 5.731 u[IU]/mL — ABNORMAL HIGH (ref 0.308–3.960)

## 2018-02-07 LAB — MAGNESIUM: Magnesium: 2 mg/dL (ref 1.5–2.5)

## 2018-02-08 ENCOUNTER — Inpatient Hospital Stay (HOSPITAL_BASED_OUTPATIENT_CLINIC_OR_DEPARTMENT_OTHER): Payer: BLUE CROSS/BLUE SHIELD | Admitting: Hematology and Oncology

## 2018-02-08 ENCOUNTER — Ambulatory Visit
Admission: RE | Admit: 2018-02-08 | Discharge: 2018-02-08 | Disposition: A | Discharge status: Home / Self Care | Payer: BLUE CROSS/BLUE SHIELD | Source: Ambulatory Visit | Attending: Radiation Oncology | Admitting: Radiation Oncology

## 2018-02-08 ENCOUNTER — Inpatient Hospital Stay: Payer: BLUE CROSS/BLUE SHIELD

## 2018-02-08 ENCOUNTER — Encounter: Payer: Self-pay | Admitting: Hematology and Oncology

## 2018-02-08 VITALS — BP 127/84 | HR 77 | Temp 98.4°F | Resp 18 | Ht 61.0 in | Wt 219.4 lb

## 2018-02-08 DIAGNOSIS — R946 Abnormal results of thyroid function studies: Secondary | ICD-10-CM

## 2018-02-08 DIAGNOSIS — G893 Neoplasm related pain (acute) (chronic): Secondary | ICD-10-CM | POA: Diagnosis not present

## 2018-02-08 DIAGNOSIS — D500 Iron deficiency anemia secondary to blood loss (chronic): Secondary | ICD-10-CM

## 2018-02-08 DIAGNOSIS — C530 Malignant neoplasm of endocervix: Secondary | ICD-10-CM | POA: Diagnosis not present

## 2018-02-08 DIAGNOSIS — E039 Hypothyroidism, unspecified: Secondary | ICD-10-CM | POA: Diagnosis not present

## 2018-02-08 DIAGNOSIS — E119 Type 2 diabetes mellitus without complications: Secondary | ICD-10-CM

## 2018-02-08 LAB — HIV 1/2 AB DIFFERENTIATION
HIV 1 Ab: POSITIVE — AB
HIV 2 AB: NEGATIVE

## 2018-02-08 LAB — HIV ANTIBODY (ROUTINE TESTING W REFLEX): HIV Screen 4th Generation wRfx: REACTIVE — AB

## 2018-02-08 MED ORDER — MORPHINE SULFATE 15 MG PO TABS: 15.0000 mg | ORAL_TABLET | ORAL | 0 refills | Status: DC | PRN

## 2018-02-08 MED ORDER — ONDANSETRON HCL 8 MG PO TABS: 8.0000 mg | ORAL_TABLET | Freq: Three times a day (TID) | ORAL | 1 refills | Status: DC | PRN

## 2018-02-08 MED ORDER — LIDOCAINE-PRILOCAINE 2.5-2.5 % EX CREA: TOPICAL_CREAM | CUTANEOUS | 3 refills | Status: DC

## 2018-02-08 MED ORDER — PROCHLORPERAZINE MALEATE 10 MG PO TABS: 10.0000 mg | ORAL_TABLET | Freq: Four times a day (QID) | ORAL | 1 refills | Status: DC | PRN

## 2018-02-08 MED ORDER — LEVOTHYROXINE SODIUM 25 MCG PO TABS: 25.0000 ug | ORAL_TABLET | Freq: Every day | ORAL | 1 refills | Status: DC

## 2018-02-08 MED FILL — MORPHINE SULFATE IR 15 MG T: 15 | 10 days supply | Qty: 60 | Fill #0

## 2018-02-08 MED FILL — LEVOTHYROXINE 25 MCG TABLET: 25 | 30 days supply | Qty: 30 | Fill #0

## 2018-02-08 MED FILL — LIDOCAINE-PRILOCAINE CREAM: 2.5-2.5 | 30 days supply | Qty: 30 | Fill #0

## 2018-02-08 MED FILL — ONDANSETRON HCL 8 MG TABLET: 8 | 30 days supply | Qty: 21 | Fill #0

## 2018-02-08 MED FILL — PROCHLORPERAZINE 10 MG TAB: 10 | 7 days supply | Qty: 30 | Fill #0

## 2018-02-08 NOTE — Assessment & Plan Note (Signed)
She has subclinical hypothyroidism with elevated TSH and abnormal PET scan consistent with thyroiditis I recommend low-dose levothyroxine replacement therapy Plan to recheck TSH in a month

## 2018-02-08 NOTE — Assessment & Plan Note (Signed)
She has poorly controlled pain despite taking multiple  doses of Percocet I recommend morphine sulfate We discussed risk of nausea, constipation and sedation I discussed narcotic refill policy I plan to see her back next week for further assessment of pain control

## 2018-02-08 NOTE — Progress Notes (Signed)
[place] OFFICE PROGRESS NOTE  Patient Care Team: Default, Provider, MD as PCP - General  ASSESSMENT & PLAN:  Cervical cancer [facility]) We discussed the role of chemotherapy. The intent is of curative intent.  We discussed some of the risks, benefits, side-effects of cisplatin and its role as chemo sensitizing agent. The plan for weekly cisplatin for x5 doses along with radiation treatment.  Some of the short term side-effects included, though not limited to, including weight loss, life threatening infections, risk of allergic reactions, need for transfusions of blood products, nausea, vomiting, change in bowel habits, loss of hair, admission to hospital for various reasons, and risks of death.   Long term side-effects are also discussed including risks of infertility, permanent damage to nerve function, hearing loss, chronic fatigue, kidney damage with possibility needing hemodialysis, and rare secondary malignancy including bone marrow disorders.  The patient is aware that the response rates discussed earlier is not guaranteed.  After a long discussion, patient made an informed decision to proceed with the prescribed plan of care.   Patient education material was dispensed.  Due to her weight, I plan to do 10% dose reduction of total cisplatin to about 75 mg I will see her on a weekly basis for supportive care   Abnormal thyroid uptake She has subclinical hypothyroidism with elevated TSH and abnormal PET scan consistent with thyroiditis I recommend low-dose levothyroxine replacement therapy Plan to recheck TSH in a month  Cancer associated pain She has poorly controlled pain despite taking multiple  doses of Percocet I recommend morphine sulfate We discussed risk of nausea, constipation and sedation I discussed narcotic refill policy I plan to see her back next week for further assessment of pain control  Diabetes mellitus without complication ([place]) She has recent  diagnosis of diabetes She had no complication from diabetes We will monitor her blood sugar carefully during treatment.  Anemia, blood loss She has mild anemia with reactive thrombocytosis likely due to chronic bleeding Observe for now Plan to monitor carefully   Orders Placed This Encounter  Procedures  . TSH    Standing Status:   Future    Standing Expiration Date:   03/15/2019    INTERVAL HISTORY: Please see below for problem oriented charting. She returns for further follow-up and for chemotherapy consent She continues to have poorly controlled pain She has mild constipation with pain medicine but is taking laxative Denies recent nausea or vomiting Denies excessive vaginal bleeding  SUMMARY OF ONCOLOGIC HISTORY:   Cervical cancer ([place])   01/04/2017 Initial Diagnosis    She went to urgent care service for evaluation of abnormal vaginal symptoms with discharge and was diagnosed with bacterial vaginosis      01/03/2018 Imaging    1. Complex thick-walled fluid collection/abscess the lower pelvis with possible communication to the lower uterine/vagina. Clinical correlation and further evaluation with pelvic ultrasound recommended. CT with IV and delayed oral and rectal contrast may provide additional information. A smaller collection is also noted in the left posterior hemipelvis the left of the rectum. 2. No bowel obstruction or active inflammation.  Normal appendix. Please note the described 5.0 x 5.5 cm complex collection within the pelvis appears to be in the region of the cervix and may represent a necrotic mass/neoplasm. The described 3 x 3 cm low attenuating lesion to the left of the rectum may represent an abnormal lymph node.       01/04/2018 Pathology Results    Cervix, biopsy -  POORLY DIFFERENTIATED CARCINOMA - SEE COMMENT Microscopic Comment The biopsy material consists of multiple fragments of tissue with infiltrative tumor and granulation tissue. By  immunohistochemistry, the tumor is positive for cytokeratin 5/6, p63, p16 and vimentin (diffusely positive) but negative for ER. 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      01/04/2018 Imaging    Pelvic US 1. Heterogeneous area of irregularity with Doppler flow in the region of the cervix. A cervical mass is not excluded. Clinical correlation is recommended.  2. Rounded complex mass/collection posterior to the lower uterus/vagina corresponding to the larger complex collection seen on the earlier CT. This may represent a necrotic or purulent collection. 3. Grossly unremarkable left ovary. Nonvisualization the right ovary.      01/04/2018 Procedure    She underwent examination under anesthesia Procedure: pt taken to the operating room where gen anesthesia was performed. She was placed in the dorsal lithotomy position and prepped and draped in the usual sterile fashion. A bivalve speculum was placed in the pts vagina and a macerated cervix was noted. There was very little that resembled a normal cervix. The tissue was necrotic and malodorous.  Several biopsies were obtained. There was actually very little bleeding from the biopsy sites although there was bleeding from within the uterine cavity above the location of the cervix.         01/22/2018 PET scan    1. Large hypermetabolic cervical mass with local invasion into the perirectal space. Bilateral pelvic adenopathy noted with small but hypermetabolic pelvic lymph nodes compatible with malignancy. I do not see definite hypermetabolic adenopathy in the upper abdomen, but there are scattered small bilateral hypermetabolic lymph nodes in the neck and axillary regions of uncertain significance. Given the low level activity and the skipped region in the abdomen, it is possible that the neck and chest lymph nodes are simply reactive, as usually I would expect to see more upper abdominal adenopathy were  there involvement in the neck and chest. Surveillance is probably warranted. 2. Bilateral mild diffuse thyroid activity suggesting thyroiditis. 3. Other imaging findings of potential clinical significance: Aortic Atherosclerosis (ICD10-I70.0). Mild cardiomegaly. Bilateral iliac artery atherosclerosis. Suspected uterine fundal fibroid.       01/25/2018 Cancer Staging    Staging form: Cervix Uteri, AJCC 8th Edition - Clinical: FIGO Stage IVA (cT4, cN1, cM0) - Signed by [name], MD on 01/25/2018      02/01/2018 Procedure    Successful 8 French right internal jugular vein power port placement with its tip at the SVC/RA junction.       REVIEW OF SYSTEMS:   Constitutional: Denies fevers, chills or abnormal weight loss Eyes: Denies blurriness of vision Ears, nose, mouth, throat, and face: Denies mucositis or sore throat Respiratory: Denies cough, dyspnea or wheezes Cardiovascular: Denies palpitation, chest discomfort or lower extremity swelling Gastrointestinal:  Denies nausea, heartburn or change in bowel habits Skin: Denies abnormal skin rashes Lymphatics: Denies new lymphadenopathy or easy bruising Neurological:Denies numbness, tingling or new weaknesses Behavioral/Psych: Mood is stable, no new changes  All other systems were reviewed with the patient and are negative.  I have reviewed the past medical history, past surgical history, social history and family history with the patient and they are unchanged from previous note.  ALLERGIES:  has No Known Allergies.  MEDICATIONS:  Current Outpatient Medications  Medication Sig Dispense Refill  . docusate sodium (COLACE) 100 MG capsule Take 100 mg by mouth daily.    Marland Kitchen  levothyroxine (SYNTHROID, LEVOTHROID) 25 MCG tablet Take 1 tablet (25 mcg total) by mouth daily before breakfast. 30 tablet 1  . lidocaine-prilocaine (EMLA) cream Apply to affected area once 30 g 3  . metFORMIN (GLUCOPHAGE) 500 MG tablet Take 250 mg by mouth 2 (two)  times daily with a meal.    . Misc Natural Products (ESTROVEN + ENERGY MAX STRENGTH PO) Take 1 tablet by mouth daily.    Marland Kitchen morphine (MSIR) 15 MG tablet Take 1 tablet (15 mg total) by mouth every 4 (four) hours as needed for severe pain. 60 tablet 0  . ondansetron (ZOFRAN) 4 MG tablet Take 1 tablet (4 mg total) by mouth every 8 (eight) hours as needed for nausea or vomiting. 20 tablet 0  . ondansetron (ZOFRAN) 8 MG tablet Take 1 tablet (8 mg total) by mouth every 8 (eight) hours as needed. 30 tablet 1  . oxyCODONE-acetaminophen (PERCOCET/ROXICET) 5-325 MG tablet Take 1-2 tablets by mouth every 6 (six) hours as needed for severe pain. 60 tablet 0  . prochlorperazine (COMPAZINE) 10 MG tablet Take 1 tablet (10 mg total) by mouth every 6 (six) hours as needed (Nausea or vomiting). 30 tablet 1   No current facility-administered medications for this visit.     PHYSICAL EXAMINATION: ECOG PERFORMANCE STATUS: 1 - Symptomatic but completely ambulatory  Vitals:   02/08/18 1410  BP: 127/84  Pulse: 77  Resp: 18  Temp: 98.4 F (36.9 C)  SpO2: 98%   Filed Weights   02/08/18 1410  Weight: 219 lb 6.4 oz (99.5 kg)    GENERAL:alert, no distress and comfortable NEURO: alert & oriented x 3 with fluent speech, no focal motor/sensory deficits  LABORATORY DATA:  I have reviewed the data as listed    Component Value Date/Time   NA 137 02/07/2018 1255   K 3.8 02/07/2018 1255   CL 104 02/07/2018 1255   CO2 26 02/07/2018 1255   GLUCOSE 110 02/07/2018 1255   BUN 10 02/07/2018 1255   CREATININE 0.86 02/07/2018 1255   CREATININE 0.74 01/29/2018 1239   CALCIUM 9.4 02/07/2018 1255   PROT 9.5 (H) 02/07/2018 1255   ALBUMIN 3.4 (L) 02/07/2018 1255   AST 18 02/07/2018 1255   ALT 13 02/07/2018 1255   ALKPHOS 81 02/07/2018 1255   BILITOT 0.5 02/07/2018 1255   GFRNONAA >60 02/07/2018 1255   GFRNONAA >60 01/29/2018 1239   GFRAA >60 02/07/2018 1255   GFRAA >60 01/29/2018 1239    No results found for:  SPEP, UPEP  Lab Results  Component Value Date   WBC 8.5 02/07/2018   NEUTROABS 7.1 (H) 02/07/2018   HGB 10.8 (L) 02/07/2018   HCT 33.2 (L) 02/07/2018   MCV 86.2 02/07/2018   PLT 454 (H) 02/07/2018      Chemistry      Component Value Date/Time   NA 137 02/07/2018 1255   K 3.8 02/07/2018 1255   CL 104 02/07/2018 1255   CO2 26 02/07/2018 1255   BUN 10 02/07/2018 1255   CREATININE 0.86 02/07/2018 1255   CREATININE 0.74 01/29/2018 1239      Component Value Date/Time   CALCIUM 9.4 02/07/2018 1255   ALKPHOS 81 02/07/2018 1255   AST 18 02/07/2018 1255   ALT 13 02/07/2018 1255   BILITOT 0.5 02/07/2018 1255       RADIOGRAPHIC STUDIES: I have personally reviewed the radiological images as listed and agreed with the findings in the report. Nm Pet Image Initial (pi) Skull Base To  Thigh  Result Date: 01/22/2018 CLINICAL DATA:  Initial treatment strategy for cervical cancer. EXAM: NUCLEAR MEDICINE PET SKULL BASE TO THIGH TECHNIQUE: 10.7 mCi F-18 FDG was injected intravenously. Full-ring PET imaging was performed from the skull base to thigh after the radiotracer. CT data was obtained and used for attenuation correction and anatomic localization. Fasting blood glucose: 118 mg/dl Mediastinal blood pool activity: SUV max 2.2 COMPARISON:  CT scan dated 01/03/2018 FINDINGS: NECK: Symmetric palatine tonsillar activity is likely physiologic. Scattered small level IIa, IIb, and V lymph nodes are present with low-grade activity. For example a left IIb lymph node on image 16/4 measures 6 mm in short axis and has a maximum SUV of 2.9 Bilateral mild diffuse thyroid activity is present. Incidental CT findings: None CHEST: Small bilateral axillary lymph nodes demonstrate low-grade activity. For example, a left axillary lymph node with fatty hilum measuring 0.8 cm in short axis on image 43/4 has a maximum SUV of 2.6. Accentuated activity in the distal esophagus, maximum SUV 6.7, nonspecific for physiologic  activity versus neoplastic disease. Incidental CT findings: Aortic arch atherosclerosis. Mild cardiomegaly. ABDOMEN/PELVIS: Widespread physiologic activity in the bowel. Left kidney upper pole photopenic lesion, compatible with a cyst. There are small hypermetabolic left common iliac, right pericaval, bilateral external iliac, right obturator, and right SFA lymph nodes observed. For example an indistinct left external iliac node measuring about 0.9 cm in short axis on image 145/4 has a maximum SUV of 5.6. Indistinct left common iliac nodal activity at 7.7. A small right operator node measures 6 mm in diameter on image 152/4 with maximum SUV 5.7 There is a large cervical mass with marked abnormal hypermetabolic activity, maximum SUV 24.7. The abnormal metabolic activity in this vicinity measures 5.4 by 4.8 cm Centrally necrotic tumor extending posteriorly from the cervical region into the perirectal space and abutting the rectal wall measuring about 3.4 cm in diameter, maximum SUV 13.9 Perirectal space lymph node 9 mm in short axis on image 147/4, maximum SUV 11.5 Indistinct accentuated activity in the vicinity of the right ovary, possibly from adjacent small lymph nodes. Incidental CT findings: Bilateral iliac artery atherosclerosis. Nodularity along the uterine fundus, probably a fibroid. SKELETON: Subtle marrow heterogeneity but without significant focal skeletal lesion observed. Activity along a right lower lumbar facet joint is thought to be degenerative given the underlying degenerative findings. Incidental CT findings: none IMPRESSION: 1. Large hypermetabolic cervical mass with local invasion into the perirectal space. Bilateral pelvic adenopathy noted with small but hypermetabolic pelvic lymph nodes compatible with malignancy. I do not see definite hypermetabolic adenopathy in the upper abdomen, but there are scattered small bilateral hypermetabolic lymph nodes in the neck and axillary regions of uncertain  significance. Given the low level activity and the skipped region in the abdomen, it is possible that the neck and chest lymph nodes are simply reactive, as usually I would expect to see more upper abdominal adenopathy were there involvement in the neck and chest. Surveillance is probably warranted. 2. Bilateral mild diffuse thyroid activity suggesting thyroiditis. 3. Other imaging findings of potential clinical significance: Aortic Atherosclerosis (ICD10-I70.0). Mild cardiomegaly. Bilateral iliac artery atherosclerosis. Suspected uterine fundal fibroid. Electronically Signed   By: Van Clines M.D.   On: 01/22/2018 12:41   Ir Fluoro Guide Port Insertion Right  Result Date: 02/01/2018 CLINICAL DATA:  Cervical cancer EXAM: TUNNEL POWER PORT PLACEMENT WITH SUBCUTANEOUS POCKET UTILIZING ULTRASOUND & FLOUROSCOPY FLUOROSCOPY TIME:  30 seconds.  Four mGy. MEDICATIONS AND MEDICAL HISTORY: Versed 4 mg,  Fentanyl 100 mcg. Additional Medications: Ancef 2 g. Versed 4 mg. Fentanyl 100 mcg. Antibiotics were given within 2 hours of the procedure. ANESTHESIA/SEDATION: Moderate sedation time: 27 minutes. Nursing monitored the the patient during the procedure. PROCEDURE: After written informed consent was obtained, patient was placed in the supine position on angiographic table. The right neck and chest was prepped and draped in a sterile fashion. Lidocaine was utilized for local anesthesia. The right jugular vein was noted to be patent initially with ultrasound. Under sonographic guidance, a micropuncture needle was inserted into the right IJ vein (Ultrasound and fluoroscopic image documentation was performed). The needle was removed over an 018 wire which was exchanged for a Amplatz. This was advanced into the IVC. An 8-French dilator was advanced over the Amplatz. A small incision was made in the right upper chest over the anterior right second rib. Utilizing blunt dissection, a subcutaneous pocket was created in the  caudal direction. The pocket was irrigated with a copious amount of sterile normal saline. The port catheter was tunneled from the chest incision, and out the neck incision. The reservoir was inserted into the subcutaneous pocket and secured with two 3-0 Ethilon stitches. A peel-away sheath was advanced over the Amplatz wire. The port catheter was cut to measure length and inserted through the peel-away sheath. The peel-away sheath was removed. The chest incision was closed with 3-0 Vicryl interrupted stitches for the subcutaneous tissue and a running of 4-0 Vicryl subcuticular stitch for the skin. The neck incision was closed with a 4-0 Vicryl subcuticular stitch. Derma-bond was applied to both surgical incisions. The port reservoir was flushed and instilled with heparinized saline. No complications. FINDINGS: A right IJ vein Port-A-Cath is in place with its tip at the cavoatrial junction. COMPLICATIONS: None IMPRESSION: Successful 8 French right internal jugular vein power port placement with its tip at the SVC/RA junction. Electronically Signed   By: Marybelle Killings M.D.   On: 02/01/2018 14:42    All questions were answered. The patient knows to call the clinic with any problems, questions or concerns. No barriers to learning was detected.  I spent 25 minutes counseling the patient face to face. The total time spent in the appointment was 30 minutes and more than 50% was on counseling and review of test results  [name], MD 02/08/2018 2:56 PM

## 2018-02-08 NOTE — Assessment & Plan Note (Signed)
She has mild anemia with reactive thrombocytosis likely due to chronic bleeding Observe for now Plan to monitor carefully

## 2018-02-08 NOTE — Assessment & Plan Note (Signed)
We discussed the role of chemotherapy. The intent is of curative intent.  We discussed some of the risks, benefits, side-effects of cisplatin and its role as chemo sensitizing agent. The plan for weekly cisplatin for x5 doses along with radiation treatment.  Some of the short term side-effects included, though not limited to, including weight loss, life threatening infections, risk of allergic reactions, need for transfusions of blood products, nausea, vomiting, change in bowel habits, loss of hair, admission to hospital for various reasons, and risks of death.   Long term side-effects are also discussed including risks of infertility, permanent damage to nerve function, hearing loss, chronic fatigue, kidney damage with possibility needing hemodialysis, and rare secondary malignancy including bone marrow disorders.  The patient is aware that the response rates discussed earlier is not guaranteed.  After a long discussion, patient made an informed decision to proceed with the prescribed plan of care.   Patient education material was dispensed.  Due to her weight, I plan to do 10% dose reduction of total cisplatin to about 75 mg I will see her on a weekly basis for supportive care

## 2018-02-08 NOTE — Assessment & Plan Note (Signed)
She has recent diagnosis of diabetes She had no complication from diabetes We will monitor her blood sugar carefully during treatment.

## 2018-02-09 ENCOUNTER — Telehealth: Payer: Self-pay | Admitting: *Deleted

## 2018-02-09 ENCOUNTER — Ambulatory Visit
Admission: RE | Admit: 2018-02-09 | Discharge: 2018-02-09 | Disposition: A | Discharge status: Home / Self Care | Payer: BLUE CROSS/BLUE SHIELD | Source: Ambulatory Visit | Attending: Radiation Oncology | Admitting: Radiation Oncology

## 2018-02-09 ENCOUNTER — Inpatient Hospital Stay: Payer: BLUE CROSS/BLUE SHIELD

## 2018-02-09 ENCOUNTER — Inpatient Hospital Stay (HOSPITAL_BASED_OUTPATIENT_CLINIC_OR_DEPARTMENT_OTHER): Payer: BLUE CROSS/BLUE SHIELD | Admitting: Hematology and Oncology

## 2018-02-09 ENCOUNTER — Other Ambulatory Visit: Payer: Self-pay | Admitting: Hematology and Oncology

## 2018-02-09 ENCOUNTER — Encounter: Payer: Self-pay | Admitting: Hematology and Oncology

## 2018-02-09 VITALS — BP 107/57 | HR 77 | Temp 99.0°F | Resp 17

## 2018-02-09 DIAGNOSIS — C530 Malignant neoplasm of endocervix: Secondary | ICD-10-CM

## 2018-02-09 DIAGNOSIS — Z21 Asymptomatic human immunodeficiency virus [HIV] infection status: Secondary | ICD-10-CM

## 2018-02-09 DIAGNOSIS — B20 Human immunodeficiency virus [HIV] disease: Secondary | ICD-10-CM | POA: Insufficient documentation

## 2018-02-09 LAB — T-HELPER CELLS (CD4) COUNT (NOT AT ARMC)
CD4 % Helper T Cell: 22 % — ABNORMAL LOW (ref 33–55)
CD4 T Cell Abs: 780 /uL (ref 400–2700)

## 2018-02-09 MED ORDER — PALONOSETRON HCL INJECTION 0.25 MG/5ML
INTRAVENOUS | Status: AC
  Filled 2018-02-09: qty 5

## 2018-02-09 MED ORDER — SODIUM CHLORIDE 0.9 % IV SOLN
Freq: Once | INTRAVENOUS | Status: AC
  Administered 2018-02-09: 12:00:00 via INTRAVENOUS
  Filled 2018-02-09: qty 5

## 2018-02-09 MED ORDER — SODIUM CHLORIDE 0.9% FLUSH
10.0000 mL | INTRAVENOUS | Status: DC | PRN
  Administered 2018-02-09: 10 mL
  Filled 2018-02-09: qty 10

## 2018-02-09 MED ORDER — SODIUM CHLORIDE 0.9 % IV SOLN
Freq: Once | INTRAVENOUS | Status: AC
  Administered 2018-02-09: 10:00:00 via INTRAVENOUS

## 2018-02-09 MED ORDER — POTASSIUM CHLORIDE 2 MEQ/ML IV SOLN
Freq: Once | INTRAVENOUS | Status: AC
  Administered 2018-02-09: 10:00:00 via INTRAVENOUS
  Filled 2018-02-09: qty 10

## 2018-02-09 MED ORDER — SODIUM CHLORIDE 0.9 % IV SOLN
36.0000 mg/m2 | Freq: Once | INTRAVENOUS | Status: AC
  Administered 2018-02-09: 75 mg via INTRAVENOUS
  Filled 2018-02-09: qty 75

## 2018-02-09 MED ORDER — HEPARIN SOD (PORK) LOCK FLUSH 100 UNIT/ML IV SOLN
500.0000 [IU] | Freq: Once | INTRAVENOUS | Status: AC | PRN
  Administered 2018-02-09: 500 [IU]
  Filled 2018-02-09: qty 5

## 2018-02-09 MED ORDER — PALONOSETRON HCL INJECTION 0.25 MG/5ML
0.2500 mg | Freq: Once | INTRAVENOUS | Status: AC
  Administered 2018-02-09: 0.25 mg via INTRAVENOUS

## 2018-02-09 NOTE — Assessment & Plan Note (Addendum)
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

## 2018-02-09 NOTE — Telephone Encounter (Signed)
Per Dr Johnnye Sima called the patient to try and set up appointment for initial visit. Someone answered and advised she was busy. Advised of the contact information and advised to return call and ask for Tammy or Darnelle Maffucci.

## 2018-02-09 NOTE — Telephone Encounter (Signed)
-----   Message from [name], MD sent at 02/09/2018 11:45 AM EDT ----- Thanks so much [name], please coordinate appt and labs ----- Message ----- From: Campbell Riches, MD Sent: 02/09/2018  11:41 AM To: Reggy Eye, CMA, [name], MD  I spoke to our RN She will reach out and make appt Thanks jeff ----- Message ----- From: [name], MD Sent: 02/09/2018  10:21 AM To: Campbell Riches, MD  I will proceed Do I just place a routine referral to you? I just broke the news to her ----- Message ----- From: Campbell Riches, MD Sent: 02/09/2018   9:38 AM To: [name], MD  If you could order PCR and genotype that would be very helpful CD4 count  Thanks jeff ----- Message ----- From: [name], MD Sent: 02/09/2018   9:33 AM To: Campbell Riches, MD  Thanks I just got the results this morning I will talk to her this morning and will refer her Do you want me to order PCR/genotype or just refer her your way? ----- Message ----- From: Campbell Riches, MD Sent: 02/09/2018   8:56 AM To: [name], MD  This patient appears to have a postive HIV test  Will you be notifying her?  i'd be glad to see her in clinic next week Thanks jeff

## 2018-02-09 NOTE — Progress Notes (Signed)
[place] OFFICE PROGRESS NOTE  Patient Care Team: Default, Provider, MD as PCP - General  ASSESSMENT & PLAN:  HIV (human immunodeficiency virus infection) ([place]) In a discrete manner, I shared the positive HIV test with the patient The patient is both surprised and very upset She denies risk factors with intravenous drug use, prior blood transfusion or high risk sexual behavior She is married to her current husband for 1 year With her permission, subsequently, I also shared about news with her husband I have discussed this briefly with infectious disease team Per recommendation, I have ordered a CD4 count, HIV PCR along with genotype and infectious disease consult to follow I will call the patient with test results next week  Cervical cancer ([place]) I informed the patient that the HIV diagnosis will not impact the decision of treatment recommendation for cervical cancer We would proceed with treatment as scheduled   Orders Placed This Encounter  Procedures  . Ambulatory referral to Infectious Disease    Referral Priority:   Routine    Referral Type:   Consultation    Referral Reason:   Specialty Services Required    Referred to Provider:   Campbell Riches, MD    Requested Specialty:   Infectious Diseases    Number of Visits Requested:   1    INTERVAL HISTORY: Please see below for problem oriented charting. I saw the patient and discuss results of positive HIV testing with her in the infusion room The HIV testing was ordered as part of the workup for diagnosis of cervical cancer The patient is not symptomatic.  She denies recent fever, chills or signs of active infection She denies history of promiscuity, IV drug use or prior transfusion  SUMMARY OF ONCOLOGIC HISTORY:   Cervical cancer ([place])   01/04/2017 Initial Diagnosis    She went to urgent care service for evaluation of abnormal vaginal symptoms with discharge and was diagnosed with bacterial vaginosis       01/03/2018 Imaging    1. Complex thick-walled fluid collection/abscess the lower pelvis with possible communication to the lower uterine/vagina. Clinical correlation and further evaluation with pelvic ultrasound recommended. CT with IV and delayed oral and rectal contrast may provide additional information. A smaller collection is also noted in the left posterior hemipelvis the left of the rectum. 2. No bowel obstruction or active inflammation.  Normal appendix. Please note the described 5.0 x 5.5 cm complex collection within the pelvis appears to be in the region of the cervix and may represent a necrotic mass/neoplasm. The described 3 x 3 cm low attenuating lesion to the left of the rectum may represent an abnormal lymph node.       01/04/2018 Pathology Results    Cervix, biopsy - POORLY DIFFERENTIATED CARCINOMA - SEE COMMENT Microscopic Comment The biopsy material consists of multiple fragments of tissue with infiltrative tumor and granulation tissue. By immunohistochemistry, the tumor is positive for cytokeratin 5/6, p63, p16 and vimentin (diffusely positive) but negative for ER. 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      01/04/2018 Imaging    Pelvic US 1. Heterogeneous area of irregularity with Doppler flow in the region of the cervix. A cervical mass is not excluded. Clinical correlation is recommended.  2. Rounded complex mass/collection posterior to the lower uterus/vagina corresponding to the larger complex collection seen on the earlier CT. This may represent a necrotic or purulent collection. 3. Grossly unremarkable left ovary. Nonvisualization  the right ovary.      01/04/2018 Procedure    She underwent examination under anesthesia Procedure: pt taken to the operating room where gen anesthesia was performed. She was placed in the dorsal lithotomy position and prepped and draped in the usual sterile fashion. A bivalve speculum  was placed in the pts vagina and a macerated cervix was noted. There was very little that resembled a normal cervix. The tissue was necrotic and malodorous.  Several biopsies were obtained. There was actually very little bleeding from the biopsy sites although there was bleeding from within the uterine cavity above the location of the cervix.         01/22/2018 PET scan    1. Large hypermetabolic cervical mass with local invasion into the perirectal space. Bilateral pelvic adenopathy noted with small but hypermetabolic pelvic lymph nodes compatible with malignancy. I do not see definite hypermetabolic adenopathy in the upper abdomen, but there are scattered small bilateral hypermetabolic lymph nodes in the neck and axillary regions of uncertain significance. Given the low level activity and the skipped region in the abdomen, it is possible that the neck and chest lymph nodes are simply reactive, as usually I would expect to see more upper abdominal adenopathy were there involvement in the neck and chest. Surveillance is probably warranted. 2. Bilateral mild diffuse thyroid activity suggesting thyroiditis. 3. Other imaging findings of potential clinical significance: Aortic Atherosclerosis (ICD10-I70.0). Mild cardiomegaly. Bilateral iliac artery atherosclerosis. Suspected uterine fundal fibroid.       01/25/2018 Cancer Staging    Staging form: Cervix Uteri, AJCC 8th Edition - Clinical: FIGO Stage IVA (cT4, cN1, cM0) - Signed by [name], MD on 01/25/2018      02/01/2018 Procedure    Successful 8 French right internal jugular vein power port placement with its tip at the SVC/RA junction.       REVIEW OF SYSTEMS:  All other systems were reviewed with the patient and are negative.  I have reviewed the past medical history, past surgical history, social history and family history with the patient and they are unchanged from previous note.  ALLERGIES:  has No Known Allergies.  MEDICATIONS:   Current Outpatient Medications  Medication Sig Dispense Refill  . docusate sodium (COLACE) 100 MG capsule Take 100 mg by mouth daily.    Marland Kitchen levothyroxine (SYNTHROID, LEVOTHROID) 25 MCG tablet Take 1 tablet (25 mcg total) by mouth daily before breakfast. 30 tablet 1  . lidocaine-prilocaine (EMLA) cream Apply to affected area once 30 g 3  . metFORMIN (GLUCOPHAGE) 500 MG tablet Take 250 mg by mouth 2 (two) times daily with a meal.    . Misc Natural Products (ESTROVEN + ENERGY MAX STRENGTH PO) Take 1 tablet by mouth daily.    Marland Kitchen morphine (MSIR) 15 MG tablet Take 1 tablet (15 mg total) by mouth every 4 (four) hours as needed for severe pain. 60 tablet 0  . ondansetron (ZOFRAN) 4 MG tablet Take 1 tablet (4 mg total) by mouth every 8 (eight) hours as needed for nausea or vomiting. 20 tablet 0  . ondansetron (ZOFRAN) 8 MG tablet Take 1 tablet (8 mg total) by mouth every 8 (eight) hours as needed. 30 tablet 1  . oxyCODONE-acetaminophen (PERCOCET/ROXICET) 5-325 MG tablet Take 1-2 tablets by mouth every 6 (six) hours as needed for severe pain. 60 tablet 0  . prochlorperazine (COMPAZINE) 10 MG tablet Take 1 tablet (10 mg total) by mouth every 6 (six) hours as needed (Nausea or vomiting).  30 tablet 1   No current facility-administered medications for this visit.     PHYSICAL EXAMINATION: ECOG PERFORMANCE STATUS: 1 - Symptomatic but completely ambulatory GENERAL:alert, no distress and comfortable NEURO: alert & oriented x 3 with fluent speech, no focal motor/sensory deficits  LABORATORY DATA:  I have reviewed the data as listed    Component Value Date/Time   NA 137 02/07/2018 1255   K 3.8 02/07/2018 1255   CL 104 02/07/2018 1255   CO2 26 02/07/2018 1255   GLUCOSE 110 02/07/2018 1255   BUN 10 02/07/2018 1255   CREATININE 0.86 02/07/2018 1255   CREATININE 0.74 01/29/2018 1239   CALCIUM 9.4 02/07/2018 1255   PROT 9.5 (H) 02/07/2018 1255   ALBUMIN 3.4 (L) 02/07/2018 1255   AST 18 02/07/2018 1255    ALT 13 02/07/2018 1255   ALKPHOS 81 02/07/2018 1255   BILITOT 0.5 02/07/2018 1255   GFRNONAA >60 02/07/2018 1255   GFRNONAA >60 01/29/2018 1239   GFRAA >60 02/07/2018 1255   GFRAA >60 01/29/2018 1239    No results found for: SPEP, UPEP  Lab Results  Component Value Date   WBC 8.5 02/07/2018   NEUTROABS 7.1 (H) 02/07/2018   HGB 10.8 (L) 02/07/2018   HCT 33.2 (L) 02/07/2018   MCV 86.2 02/07/2018   PLT 454 (H) 02/07/2018      Chemistry      Component Value Date/Time   NA 137 02/07/2018 1255   K 3.8 02/07/2018 1255   CL 104 02/07/2018 1255   CO2 26 02/07/2018 1255   BUN 10 02/07/2018 1255   CREATININE 0.86 02/07/2018 1255   CREATININE 0.74 01/29/2018 1239      Component Value Date/Time   CALCIUM 9.4 02/07/2018 1255   ALKPHOS 81 02/07/2018 1255   AST 18 02/07/2018 1255   ALT 13 02/07/2018 1255   BILITOT 0.5 02/07/2018 1255       RADIOGRAPHIC STUDIES: I have personally reviewed the radiological images as listed and agreed with the findings in the report. Nm Pet Image Initial (pi) Skull Base To Thigh  Result Date: 01/22/2018 CLINICAL DATA:  Initial treatment strategy for cervical cancer. EXAM: NUCLEAR MEDICINE PET SKULL BASE TO THIGH TECHNIQUE: 10.7 mCi F-18 FDG was injected intravenously. Full-ring PET imaging was performed from the skull base to thigh after the radiotracer. CT data was obtained and used for attenuation correction and anatomic localization. Fasting blood glucose: 118 mg/dl Mediastinal blood pool activity: SUV max 2.2 COMPARISON:  CT scan dated 01/03/2018 FINDINGS: NECK: Symmetric palatine tonsillar activity is likely physiologic. Scattered small level IIa, IIb, and V lymph nodes are present with low-grade activity. For example a left IIb lymph node on image 16/4 measures 6 mm in short axis and has a maximum SUV of 2.9 Bilateral mild diffuse thyroid activity is present. Incidental CT findings: None CHEST: Small bilateral axillary lymph nodes demonstrate  low-grade activity. For example, a left axillary lymph node with fatty hilum measuring 0.8 cm in short axis on image 43/4 has a maximum SUV of 2.6. Accentuated activity in the distal esophagus, maximum SUV 6.7, nonspecific for physiologic activity versus neoplastic disease. Incidental CT findings: Aortic arch atherosclerosis. Mild cardiomegaly. ABDOMEN/PELVIS: Widespread physiologic activity in the bowel. Left kidney upper pole photopenic lesion, compatible with a cyst. There are small hypermetabolic left common iliac, right pericaval, bilateral external iliac, right obturator, and right SFA lymph nodes observed. For example an indistinct left external iliac node measuring about 0.9 cm in short axis on image  145/4 has a maximum SUV of 5.6. Indistinct left common iliac nodal activity at 7.7. A small right operator node measures 6 mm in diameter on image 152/4 with maximum SUV 5.7 There is a large cervical mass with marked abnormal hypermetabolic activity, maximum SUV 24.7. The abnormal metabolic activity in this vicinity measures 5.4 by 4.8 cm Centrally necrotic tumor extending posteriorly from the cervical region into the perirectal space and abutting the rectal wall measuring about 3.4 cm in diameter, maximum SUV 13.9 Perirectal space lymph node 9 mm in short axis on image 147/4, maximum SUV 11.5 Indistinct accentuated activity in the vicinity of the right ovary, possibly from adjacent small lymph nodes. Incidental CT findings: Bilateral iliac artery atherosclerosis. Nodularity along the uterine fundus, probably a fibroid. SKELETON: Subtle marrow heterogeneity but without significant focal skeletal lesion observed. Activity along a right lower lumbar facet joint is thought to be degenerative given the underlying degenerative findings. Incidental CT findings: none IMPRESSION: 1. Large hypermetabolic cervical mass with local invasion into the perirectal space. Bilateral pelvic adenopathy noted with small but  hypermetabolic pelvic lymph nodes compatible with malignancy. I do not see definite hypermetabolic adenopathy in the upper abdomen, but there are scattered small bilateral hypermetabolic lymph nodes in the neck and axillary regions of uncertain significance. Given the low level activity and the skipped region in the abdomen, it is possible that the neck and chest lymph nodes are simply reactive, as usually I would expect to see more upper abdominal adenopathy were there involvement in the neck and chest. Surveillance is probably warranted. 2. Bilateral mild diffuse thyroid activity suggesting thyroiditis. 3. Other imaging findings of potential clinical significance: Aortic Atherosclerosis (ICD10-I70.0). Mild cardiomegaly. Bilateral iliac artery atherosclerosis. Suspected uterine fundal fibroid. Electronically Signed   By: Van Clines M.D.   On: 01/22/2018 12:41   Ir Fluoro Guide Port Insertion Right  Result Date: 02/01/2018 CLINICAL DATA:  Cervical cancer EXAM: TUNNEL POWER PORT PLACEMENT WITH SUBCUTANEOUS POCKET UTILIZING ULTRASOUND & FLOUROSCOPY FLUOROSCOPY TIME:  30 seconds.  Four mGy. MEDICATIONS AND MEDICAL HISTORY: Versed 4 mg, Fentanyl 100 mcg. Additional Medications: Ancef 2 g. Versed 4 mg. Fentanyl 100 mcg. Antibiotics were given within 2 hours of the procedure. ANESTHESIA/SEDATION: Moderate sedation time: 27 minutes. Nursing monitored the the patient during the procedure. PROCEDURE: After written informed consent was obtained, patient was placed in the supine position on angiographic table. The right neck and chest was prepped and draped in a sterile fashion. Lidocaine was utilized for local anesthesia. The right jugular vein was noted to be patent initially with ultrasound. Under sonographic guidance, a micropuncture needle was inserted into the right IJ vein (Ultrasound and fluoroscopic image documentation was performed). The needle was removed over an 018 wire which was exchanged for a  Amplatz. This was advanced into the IVC. An 8-French dilator was advanced over the Amplatz. A small incision was made in the right upper chest over the anterior right second rib. Utilizing blunt dissection, a subcutaneous pocket was created in the caudal direction. The pocket was irrigated with a copious amount of sterile normal saline. The port catheter was tunneled from the chest incision, and out the neck incision. The reservoir was inserted into the subcutaneous pocket and secured with two 3-0 Ethilon stitches. A peel-away sheath was advanced over the Amplatz wire. The port catheter was cut to measure length and inserted through the peel-away sheath. The peel-away sheath was removed. The chest incision was closed with 3-0 Vicryl interrupted stitches for the subcutaneous  tissue and a running of 4-0 Vicryl subcuticular stitch for the skin. The neck incision was closed with a 4-0 Vicryl subcuticular stitch. Derma-bond was applied to both surgical incisions. The port reservoir was flushed and instilled with heparinized saline. No complications. FINDINGS: A right IJ vein Port-A-Cath is in place with its tip at the cavoatrial junction. COMPLICATIONS: None IMPRESSION: Successful 8 French right internal jugular vein power port placement with its tip at the SVC/RA junction. Electronically Signed   By: Marybelle Killings M.D.   On: 02/01/2018 14:42    All questions were answered. The patient knows to call the clinic with any problems, questions or concerns. No barriers to learning was detected.  I spent 25 minutes counseling the patient face to face. The total time spent in the appointment was 30 minutes and more than 50% was on counseling and review of test results  [name], MD 02/09/2018 4:51 PM

## 2018-02-09 NOTE — Assessment & Plan Note (Signed)
I informed the patient that the HIV diagnosis will not impact the decision of treatment recommendation for cervical cancer We would proceed with treatment as scheduled

## 2018-02-09 NOTE — Patient Instructions (Signed)
[facility] Discharge Instructions for Patients Receiving Chemotherapy  Today you received the following chemotherapy agents Cisplatin  To help prevent nausea and vomiting after your treatment, we encourage you to take your nausea medication as prescribed.   If you develop nausea and vomiting that is not controlled by your nausea medication, call the clinic.   BELOW ARE SYMPTOMS THAT SHOULD BE REPORTED IMMEDIATELY:  *FEVER GREATER THAN 100.5 F  *CHILLS WITH OR WITHOUT FEVER  NAUSEA AND VOMITING THAT IS NOT CONTROLLED WITH YOUR NAUSEA MEDICATION  *UNUSUAL SHORTNESS OF BREATH  *UNUSUAL BRUISING OR BLEEDING  TENDERNESS IN MOUTH AND THROAT WITH OR WITHOUT PRESENCE OF ULCERS  *URINARY PROBLEMS  *BOWEL PROBLEMS  UNUSUAL RASH Items with * indicate a potential emergency and should be followed up as soon as possible.  Feel free to call the clinic should you have any questions or concerns. The clinic phone number is [phone].  Please show the CHEMO ALERT CARD at check-in to the Emergency Department and triage nurse.  Cisplatin injection What is this medicine? CISPLATIN (SIS pla tin) is a chemotherapy drug. It targets fast dividing cells, like cancer cells, and causes these cells to die. This medicine is used to treat many types of cancer like bladder, ovarian, and testicular cancers. This medicine may be used for other purposes; ask your health care provider or pharmacist if you have questions. COMMON BRAND NAME(S): Platinol, Platinol -AQ What should I tell my health care provider before I take this medicine? They need to know if you have any of these conditions: -blood disorders -hearing problems -kidney disease -recent or ongoing radiation therapy -an unusual or allergic reaction to cisplatin, carboplatin, other chemotherapy, other medicines, foods, dyes, or preservatives -pregnant or trying to get pregnant -breast-feeding How should I use this  medicine? This drug is given as an infusion into a vein. It is administered in a hospital or clinic by a specially trained health care professional. Talk to your pediatrician regarding the use of this medicine in children. Special care may be needed. Overdosage: If you think you have taken too much of this medicine contact a poison control center or emergency room at once. NOTE: This medicine is only for you. Do not share this medicine with others. What if I miss a dose? It is important not to miss a dose. Call your doctor or health care professional if you are unable to keep an appointment. What may interact with this medicine? -dofetilide -foscarnet -medicines for seizures -medicines to increase blood counts like filgrastim, pegfilgrastim, sargramostim -probenecid -pyridoxine used with altretamine -rituximab -some antibiotics like amikacin, gentamicin, neomycin, polymyxin B, streptomycin, tobramycin -sulfinpyrazone -vaccines -zalcitabine Talk to your doctor or health care professional before taking any of these medicines: -acetaminophen -aspirin -ibuprofen -ketoprofen -naproxen This list may not describe all possible interactions. Give your health care provider a list of all the medicines, herbs, non-prescription drugs, or dietary supplements you use. Also tell them if you smoke, drink alcohol, or use illegal drugs. Some items may interact with your medicine. What should I watch for while using this medicine? Your condition will be monitored carefully while you are receiving this medicine. You will need important blood work done while you are taking this medicine. This drug may make you feel generally unwell. This is not uncommon, as chemotherapy can affect healthy cells as well as cancer cells. Report any side effects. Continue your course of treatment even though you feel ill unless your doctor tells you to stop.   In some cases, you may be given additional medicines to help with side  effects. Follow all directions for their use. Call your doctor or health care professional for advice if you get a fever, chills or sore throat, or other symptoms of a cold or flu. Do not treat yourself. This drug decreases your body's ability to fight infections. Try to avoid being around people who are sick. This medicine may increase your risk to bruise or bleed. Call your doctor or health care professional if you notice any unusual bleeding. Be careful brushing and flossing your teeth or using a toothpick because you may get an infection or bleed more easily. If you have any dental work done, tell your dentist you are receiving this medicine. Avoid taking products that contain aspirin, acetaminophen, ibuprofen, naproxen, or ketoprofen unless instructed by your doctor. These medicines may hide a fever. Do not become pregnant while taking this medicine. Women should inform their doctor if they wish to become pregnant or think they might be pregnant. There is a potential for serious side effects to an unborn child. Talk to your health care professional or pharmacist for more information. Do not breast-feed an infant while taking this medicine. Drink fluids as directed while you are taking this medicine. This will help protect your kidneys. Call your doctor or health care professional if you get diarrhea. Do not treat yourself. What side effects may I notice from receiving this medicine? Side effects that you should report to your doctor or health care professional as soon as possible: -allergic reactions like skin rash, itching or hives, swelling of the face, lips, or tongue -signs of infection - fever or chills, cough, sore throat, pain or difficulty passing urine -signs of decreased platelets or bleeding - bruising, pinpoint red spots on the skin, black, tarry stools, nosebleeds -signs of decreased red blood cells - unusually weak or tired, fainting spells, lightheadedness -breathing  problems -changes in hearing -gout pain -low blood counts - This drug may decrease the number of white blood cells, red blood cells and platelets. You may be at increased risk for infections and bleeding. -nausea and vomiting -pain, swelling, redness or irritation at the injection site -pain, tingling, numbness in the hands or feet -problems with balance, movement -trouble passing urine or change in the amount of urine Side effects that usually do not require medical attention (report to your doctor or health care professional if they continue or are bothersome): -changes in vision -loss of appetite -metallic taste in the mouth or changes in taste This list may not describe all possible side effects. Call your doctor for medical advice about side effects. You may report side effects to FDA at 1-800-FDA-1088. Where should I keep my medicine? This drug is given in a hospital or clinic and will not be stored at home. NOTE: This sheet is a summary. It may not cover all possible information. If you have questions about this medicine, talk to your doctor, pharmacist, or health care provider.  2018 Elsevier/Gold Standard (2008-01-29 14:40:54)  

## 2018-02-11 LAB — HIV-1 RNA, PCR (GRAPH) RFX/GENO EDI
HIV-1 RNA BY PCR: 490 copies/mL
HIV-1 RNA Quant, Log: 2.69 log10copy/mL

## 2018-02-12 ENCOUNTER — Ambulatory Visit
Admission: RE | Admit: 2018-02-12 | Discharge: 2018-02-12 | Disposition: A | Discharge status: Home / Self Care | Payer: BLUE CROSS/BLUE SHIELD | Source: Ambulatory Visit | Attending: Radiation Oncology | Admitting: Radiation Oncology

## 2018-02-12 ENCOUNTER — Telehealth: Payer: Self-pay | Admitting: *Deleted

## 2018-02-12 NOTE — Telephone Encounter (Signed)
LM to call Dr Gorsuch's nurse 

## 2018-02-12 NOTE — Telephone Encounter (Signed)
-----   Message from [name], MD sent at 02/12/2018  7:22 AM EDT ----- Regarding: HIV test pls let her know viral load is detectable, confirming the diagnosis ID service has been trying to reach her for an appointment ----- Message ----- From: Interface, Lab In [place] Sent: 02/09/2018   2:13 PM To: [name], MD

## 2018-02-13 ENCOUNTER — Ambulatory Visit
Admission: RE | Admit: 2018-02-13 | Discharge: 2018-02-13 | Disposition: A | Discharge status: Home / Self Care | Payer: BLUE CROSS/BLUE SHIELD | Source: Ambulatory Visit | Attending: Radiation Oncology | Admitting: Radiation Oncology

## 2018-02-14 ENCOUNTER — Ambulatory Visit
Admission: RE | Admit: 2018-02-14 | Discharge: 2018-02-14 | Disposition: A | Discharge status: Home / Self Care | Payer: BLUE CROSS/BLUE SHIELD | Source: Ambulatory Visit | Attending: Radiation Oncology | Admitting: Radiation Oncology

## 2018-02-14 ENCOUNTER — Inpatient Hospital Stay: Payer: BLUE CROSS/BLUE SHIELD

## 2018-02-14 DIAGNOSIS — C530 Malignant neoplasm of endocervix: Secondary | ICD-10-CM | POA: Diagnosis not present

## 2018-02-14 LAB — COMPREHENSIVE METABOLIC PANEL
ALT: 11 U/L (ref 0–55)
AST: 14 U/L (ref 5–34)
Albumin: 3.2 g/dL — ABNORMAL LOW (ref 3.5–5.0)
Alkaline Phosphatase: 80 U/L (ref 40–150)
Anion gap: 7 (ref 3–11)
BILIRUBIN TOTAL: 0.3 mg/dL (ref 0.2–1.2)
BUN: 11 mg/dL (ref 7–26)
CALCIUM: 9.1 mg/dL (ref 8.4–10.4)
CO2: 29 mmol/L (ref 22–29)
CREATININE: 0.79 mg/dL (ref 0.60–1.10)
Chloride: 98 mmol/L (ref 98–109)
GFR calc Af Amer: >60 mL/min (ref >60)
Glucose, Bld: 82 mg/dL (ref 70–140)
Potassium: 3.5 mmol/L (ref 3.5–5.1)
Sodium: 134 mmol/L — ABNORMAL LOW (ref 136–145)
TOTAL PROTEIN: 8.9 g/dL — AB (ref 6.4–8.3)

## 2018-02-14 LAB — CBC WITH DIFFERENTIAL/PLATELET
BASOS ABS: 0 10*3/uL (ref 0.0–0.1)
BASOS PCT: 0 %
EOS ABS: 0 10*3/uL (ref 0.0–0.5)
EOS PCT: 1 %
HCT: 31.3 % — ABNORMAL LOW (ref 34.8–46.6)
HEMOGLOBIN: 10.5 g/dL — AB (ref 11.6–15.9)
LYMPHS ABS: 0.8 10*3/uL — AB (ref 0.9–3.3)
Lymphocytes Relative: 19 %
MCH: 28.5 pg (ref 25.1–34.0)
MCHC: 33.5 g/dL (ref 31.5–36.0)
MCV: 85 fL (ref 79.5–101.0)
Monocytes Absolute: 0.3 10*3/uL (ref 0.1–0.9)
Monocytes Relative: 8 %
NEUTROS PCT: 72 %
Neutro Abs: 3 10*3/uL (ref 1.5–6.5)
PLATELETS: 451 10*3/uL — AB (ref 145–400)
RBC: 3.68 MIL/uL — AB (ref 3.70–5.45)
RDW: 13.8 % (ref 11.2–14.5)
WBC: 4.1 10*3/uL (ref 3.9–10.3)

## 2018-02-14 LAB — MAGNESIUM: MAGNESIUM: 1.7 mg/dL (ref 1.5–2.5)

## 2018-02-15 ENCOUNTER — Encounter: Payer: Self-pay | Admitting: Hematology and Oncology

## 2018-02-15 ENCOUNTER — Ambulatory Visit
Admission: RE | Admit: 2018-02-15 | Discharge: 2018-02-15 | Disposition: A | Discharge status: Home / Self Care | Payer: BLUE CROSS/BLUE SHIELD | Source: Ambulatory Visit | Attending: Radiation Oncology | Admitting: Radiation Oncology

## 2018-02-15 ENCOUNTER — Inpatient Hospital Stay (HOSPITAL_BASED_OUTPATIENT_CLINIC_OR_DEPARTMENT_OTHER): Payer: BLUE CROSS/BLUE SHIELD | Admitting: Hematology and Oncology

## 2018-02-15 DIAGNOSIS — Z21 Asymptomatic human immunodeficiency virus [HIV] infection status: Secondary | ICD-10-CM | POA: Diagnosis not present

## 2018-02-15 DIAGNOSIS — R110 Nausea: Secondary | ICD-10-CM | POA: Diagnosis not present

## 2018-02-15 DIAGNOSIS — C530 Malignant neoplasm of endocervix: Secondary | ICD-10-CM | POA: Diagnosis not present

## 2018-02-15 DIAGNOSIS — R197 Diarrhea, unspecified: Secondary | ICD-10-CM

## 2018-02-15 DIAGNOSIS — T451X5A Adverse effect of antineoplastic and immunosuppressive drugs, initial encounter: Secondary | ICD-10-CM

## 2018-02-15 NOTE — Assessment & Plan Note (Signed)
Cause is unknown I recommend hydration as tolerated

## 2018-02-15 NOTE — Assessment & Plan Note (Signed)
She tolerated treatment with expected side effects with nausea Her cancer pain is stable/improved We will continue treatment as scheduled without dose adjustment

## 2018-02-15 NOTE — Progress Notes (Signed)
[place] OFFICE PROGRESS NOTE  Patient Care Team: Default, Provider, MD as PCP - General  ASSESSMENT & PLAN:  Cervical cancer [facility]) She tolerated treatment with expected side effects with nausea Her cancer pain is stable/improved We will continue treatment as scheduled without dose adjustment  HIV (human immunodeficiency virus infection) ([place]) She has asymptomatic HIV infection Infectious disease consultation is pending  Chemotherapy-induced nausea I reinforced the importance of antiemetics and frequent oral hydration to avoid dehydration  Diarrhea Cause is unknown I recommend hydration as tolerated   No orders of the defined types were placed in this encounter.   INTERVAL HISTORY: Please see below for problem oriented charting. She returns to be seen prior to cycle 2 of chemotherapy She complained of nausea but no vomiting Antiemetics was helpful She had mild intermittent diarrhea, manageable She denies dehydration No recent fever or chills She denies peripheral neuropathy  SUMMARY OF ONCOLOGIC HISTORY:   Cervical cancer ([place])   01/04/2017 Initial Diagnosis    She went to urgent care service for evaluation of abnormal vaginal symptoms with discharge and was diagnosed with bacterial vaginosis      01/03/2018 Imaging    1. Complex thick-walled fluid collection/abscess the lower pelvis with possible communication to the lower uterine/vagina. Clinical correlation and further evaluation with pelvic ultrasound recommended. CT with IV and delayed oral and rectal contrast may provide additional information. A smaller collection is also noted in the left posterior hemipelvis the left of the rectum. 2. No bowel obstruction or active inflammation.  Normal appendix. Please note the described 5.0 x 5.5 cm complex collection within the pelvis appears to be in the region of the cervix and may represent a necrotic mass/neoplasm. The described 3 x 3 cm low attenuating  lesion to the left of the rectum may represent an abnormal lymph node.       01/04/2018 Pathology Results    Cervix, biopsy - POORLY DIFFERENTIATED CARCINOMA - SEE COMMENT Microscopic Comment The biopsy material consists of multiple fragments of tissue with infiltrative tumor and granulation tissue. By immunohistochemistry, the tumor is positive for cytokeratin 5/6, p63, p16 and vimentin (diffusely positive) but negative for ER. 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      01/04/2018 Imaging    Pelvic US 1. Heterogeneous area of irregularity with Doppler flow in the region of the cervix. A cervical mass is not excluded. Clinical correlation is recommended.  2. Rounded complex mass/collection posterior to the lower uterus/vagina corresponding to the larger complex collection seen on the earlier CT. This may represent a necrotic or purulent collection. 3. Grossly unremarkable left ovary. Nonvisualization the right ovary.      01/04/2018 Procedure    She underwent examination under anesthesia Procedure: pt taken to the operating room where gen anesthesia was performed. She was placed in the dorsal lithotomy position and prepped and draped in the usual sterile fashion. A bivalve speculum was placed in the pts vagina and a macerated cervix was noted. There was very little that resembled a normal cervix. The tissue was necrotic and malodorous.  Several biopsies were obtained. There was actually very little bleeding from the biopsy sites although there was bleeding from within the uterine cavity above the location of the cervix.         01/22/2018 PET scan    1. Large hypermetabolic cervical mass with local invasion into the perirectal space. Bilateral pelvic adenopathy noted with small but hypermetabolic pelvic lymph nodes  compatible with malignancy. I do not see definite hypermetabolic adenopathy in the upper abdomen, but there are scattered  small bilateral hypermetabolic lymph nodes in the neck and axillary regions of uncertain significance. Given the low level activity and the skipped region in the abdomen, it is possible that the neck and chest lymph nodes are simply reactive, as usually I would expect to see more upper abdominal adenopathy were there involvement in the neck and chest. Surveillance is probably warranted. 2. Bilateral mild diffuse thyroid activity suggesting thyroiditis. 3. Other imaging findings of potential clinical significance: Aortic Atherosclerosis (ICD10-I70.0). Mild cardiomegaly. Bilateral iliac artery atherosclerosis. Suspected uterine fundal fibroid.       01/25/2018 Cancer Staging    Staging form: Cervix Uteri, AJCC 8th Edition - Clinical: FIGO Stage IVA (cT4, cN1, cM0) - Signed by [name], MD on 01/25/2018      02/01/2018 Procedure    Successful 8 French right internal jugular vein power port placement with its tip at the SVC/RA junction.      02/09/2018 -  Chemotherapy    The patient had weekly cisplatin       REVIEW OF SYSTEMS:   Constitutional: Denies fevers, chills or abnormal weight loss Eyes: Denies blurriness of vision Ears, nose, mouth, throat, and face: Denies mucositis or sore throat Respiratory: Denies cough, dyspnea or wheezes Cardiovascular: Denies palpitation, chest discomfort or lower extremity swelling Gastrointestinal:  Denies nausea, heartburn or change in bowel habits Skin: Denies abnormal skin rashes Lymphatics: Denies new lymphadenopathy or easy bruising Neurological:Denies numbness, tingling or new weaknesses Behavioral/Psych: Mood is stable, no new changes  All other systems were reviewed with the patient and are negative.  I have reviewed the past medical history, past surgical history, social history and family history with the patient and they are unchanged from previous note.  ALLERGIES:  has No Known Allergies.  MEDICATIONS:  Current Outpatient Medications   Medication Sig Dispense Refill  . docusate sodium (COLACE) 100 MG capsule Take 100 mg by mouth daily.    Marland Kitchen levothyroxine (SYNTHROID, LEVOTHROID) 25 MCG tablet Take 1 tablet (25 mcg total) by mouth daily before breakfast. 30 tablet 1  . lidocaine-prilocaine (EMLA) cream Apply to affected area once 30 g 3  . metFORMIN (GLUCOPHAGE) 500 MG tablet Take 250 mg by mouth 2 (two) times daily with a meal.    . Misc Natural Products (ESTROVEN + ENERGY MAX STRENGTH PO) Take 1 tablet by mouth daily.    Marland Kitchen morphine (MSIR) 15 MG tablet Take 1 tablet (15 mg total) by mouth every 4 (four) hours as needed for severe pain. 60 tablet 0  . ondansetron (ZOFRAN) 4 MG tablet Take 1 tablet (4 mg total) by mouth every 8 (eight) hours as needed for nausea or vomiting. 20 tablet 0  . ondansetron (ZOFRAN) 8 MG tablet Take 1 tablet (8 mg total) by mouth every 8 (eight) hours as needed. 30 tablet 1  . oxyCODONE-acetaminophen (PERCOCET/ROXICET) 5-325 MG tablet Take 1-2 tablets by mouth every 6 (six) hours as needed for severe pain. 60 tablet 0  . prochlorperazine (COMPAZINE) 10 MG tablet Take 1 tablet (10 mg total) by mouth every 6 (six) hours as needed (Nausea or vomiting). 30 tablet 1   No current facility-administered medications for this visit.     PHYSICAL EXAMINATION: ECOG PERFORMANCE STATUS: 1 - Symptomatic but completely ambulatory  Vitals:   02/15/18 1335  BP: 126/81  Pulse: 86  Resp: 18  Temp: 98.7 F (37.1 C)  SpO2: 97%  Filed Weights   02/15/18 1335  Weight: 217 lb 14.4 oz (98.8 kg)    GENERAL:alert, no distress and comfortable SKIN: skin color, texture, turgor are normal, no rashes or significant lesions EYES: normal, Conjunctiva are pink and non-injected, sclera clear OROPHARYNX:no exudate, no erythema and lips, buccal mucosa, and tongue normal  NECK: supple, thyroid normal size, non-tender, without nodularity LYMPH:  no palpable lymphadenopathy in the cervical, axillary or inguinal LUNGS:  clear to auscultation and percussion with normal breathing effort HEART: regular rate & rhythm and no murmurs and no lower extremity edema ABDOMEN:abdomen soft, non-tender and normal bowel sounds Musculoskeletal:no cyanosis of digits and no clubbing  NEURO: alert & oriented x 3 with fluent speech, no focal motor/sensory deficits  LABORATORY DATA:  I have reviewed the data as listed    Component Value Date/Time   NA 134 (L) 02/14/2018 1433   K 3.5 02/14/2018 1433   CL 98 02/14/2018 1433   CO2 29 02/14/2018 1433   GLUCOSE 82 02/14/2018 1433   BUN 11 02/14/2018 1433   CREATININE 0.79 02/14/2018 1433   CREATININE 0.74 01/29/2018 1239   CALCIUM 9.1 02/14/2018 1433   PROT 8.9 (H) 02/14/2018 1433   ALBUMIN 3.2 (L) 02/14/2018 1433   AST 14 02/14/2018 1433   ALT 11 02/14/2018 1433   ALKPHOS 80 02/14/2018 1433   BILITOT 0.3 02/14/2018 1433   GFRNONAA >60 02/14/2018 1433   GFRNONAA >60 01/29/2018 1239   GFRAA >60 02/14/2018 1433   GFRAA >60 01/29/2018 1239    No results found for: SPEP, UPEP  Lab Results  Component Value Date   WBC 4.1 02/14/2018   NEUTROABS 3.0 02/14/2018   HGB 10.5 (L) 02/14/2018   HCT 31.3 (L) 02/14/2018   MCV 85.0 02/14/2018   PLT 451 (H) 02/14/2018      Chemistry      Component Value Date/Time   NA 134 (L) 02/14/2018 1433   K 3.5 02/14/2018 1433   CL 98 02/14/2018 1433   CO2 29 02/14/2018 1433   BUN 11 02/14/2018 1433   CREATININE 0.79 02/14/2018 1433   CREATININE 0.74 01/29/2018 1239      Component Value Date/Time   CALCIUM 9.1 02/14/2018 1433   ALKPHOS 80 02/14/2018 1433   AST 14 02/14/2018 1433   ALT 11 02/14/2018 1433   BILITOT 0.3 02/14/2018 1433       RADIOGRAPHIC STUDIES: I have personally reviewed the radiological images as listed and agreed with the findings in the report. Nm Pet Image Initial (pi) Skull Base To Thigh  Result Date: 01/22/2018 CLINICAL DATA:  Initial treatment strategy for cervical cancer. EXAM: NUCLEAR MEDICINE  PET SKULL BASE TO THIGH TECHNIQUE: 10.7 mCi F-18 FDG was injected intravenously. Full-ring PET imaging was performed from the skull base to thigh after the radiotracer. CT data was obtained and used for attenuation correction and anatomic localization. Fasting blood glucose: 118 mg/dl Mediastinal blood pool activity: SUV max 2.2 COMPARISON:  CT scan dated 01/03/2018 FINDINGS: NECK: Symmetric palatine tonsillar activity is likely physiologic. Scattered small level IIa, IIb, and V lymph nodes are present with low-grade activity. For example a left IIb lymph node on image 16/4 measures 6 mm in short axis and has a maximum SUV of 2.9 Bilateral mild diffuse thyroid activity is present. Incidental CT findings: None CHEST: Small bilateral axillary lymph nodes demonstrate low-grade activity. For example, a left axillary lymph node with fatty hilum measuring 0.8 cm in short axis on image 43/4 has a maximum  SUV of 2.6. Accentuated activity in the distal esophagus, maximum SUV 6.7, nonspecific for physiologic activity versus neoplastic disease. Incidental CT findings: Aortic arch atherosclerosis. Mild cardiomegaly. ABDOMEN/PELVIS: Widespread physiologic activity in the bowel. Left kidney upper pole photopenic lesion, compatible with a cyst. There are small hypermetabolic left common iliac, right pericaval, bilateral external iliac, right obturator, and right SFA lymph nodes observed. For example an indistinct left external iliac node measuring about 0.9 cm in short axis on image 145/4 has a maximum SUV of 5.6. Indistinct left common iliac nodal activity at 7.7. A small right operator node measures 6 mm in diameter on image 152/4 with maximum SUV 5.7 There is a large cervical mass with marked abnormal hypermetabolic activity, maximum SUV 24.7. The abnormal metabolic activity in this vicinity measures 5.4 by 4.8 cm Centrally necrotic tumor extending posteriorly from the cervical region into the perirectal space and abutting the  rectal wall measuring about 3.4 cm in diameter, maximum SUV 13.9 Perirectal space lymph node 9 mm in short axis on image 147/4, maximum SUV 11.5 Indistinct accentuated activity in the vicinity of the right ovary, possibly from adjacent small lymph nodes. Incidental CT findings: Bilateral iliac artery atherosclerosis. Nodularity along the uterine fundus, probably a fibroid. SKELETON: Subtle marrow heterogeneity but without significant focal skeletal lesion observed. Activity along a right lower lumbar facet joint is thought to be degenerative given the underlying degenerative findings. Incidental CT findings: none IMPRESSION: 1. Large hypermetabolic cervical mass with local invasion into the perirectal space. Bilateral pelvic adenopathy noted with small but hypermetabolic pelvic lymph nodes compatible with malignancy. I do not see definite hypermetabolic adenopathy in the upper abdomen, but there are scattered small bilateral hypermetabolic lymph nodes in the neck and axillary regions of uncertain significance. Given the low level activity and the skipped region in the abdomen, it is possible that the neck and chest lymph nodes are simply reactive, as usually I would expect to see more upper abdominal adenopathy were there involvement in the neck and chest. Surveillance is probably warranted. 2. Bilateral mild diffuse thyroid activity suggesting thyroiditis. 3. Other imaging findings of potential clinical significance: Aortic Atherosclerosis (ICD10-I70.0). Mild cardiomegaly. Bilateral iliac artery atherosclerosis. Suspected uterine fundal fibroid. Electronically Signed   By: Van Clines M.D.   On: 01/22/2018 12:41   Ir Fluoro Guide Port Insertion Right  Result Date: 02/01/2018 CLINICAL DATA:  Cervical cancer EXAM: TUNNEL POWER PORT PLACEMENT WITH SUBCUTANEOUS POCKET UTILIZING ULTRASOUND & FLOUROSCOPY FLUOROSCOPY TIME:  30 seconds.  Four mGy. MEDICATIONS AND MEDICAL HISTORY: Versed 4 mg, Fentanyl 100 mcg.  Additional Medications: Ancef 2 g. Versed 4 mg. Fentanyl 100 mcg. Antibiotics were given within 2 hours of the procedure. ANESTHESIA/SEDATION: Moderate sedation time: 27 minutes. Nursing monitored the the patient during the procedure. PROCEDURE: After written informed consent was obtained, patient was placed in the supine position on angiographic table. The right neck and chest was prepped and draped in a sterile fashion. Lidocaine was utilized for local anesthesia. The right jugular vein was noted to be patent initially with ultrasound. Under sonographic guidance, a micropuncture needle was inserted into the right IJ vein (Ultrasound and fluoroscopic image documentation was performed). The needle was removed over an 018 wire which was exchanged for a Amplatz. This was advanced into the IVC. An 8-French dilator was advanced over the Amplatz. A small incision was made in the right upper chest over the anterior right second rib. Utilizing blunt dissection, a subcutaneous pocket was created in the caudal direction. The  pocket was irrigated with a copious amount of sterile normal saline. The port catheter was tunneled from the chest incision, and out the neck incision. The reservoir was inserted into the subcutaneous pocket and secured with two 3-0 Ethilon stitches. A peel-away sheath was advanced over the Amplatz wire. The port catheter was cut to measure length and inserted through the peel-away sheath. The peel-away sheath was removed. The chest incision was closed with 3-0 Vicryl interrupted stitches for the subcutaneous tissue and a running of 4-0 Vicryl subcuticular stitch for the skin. The neck incision was closed with a 4-0 Vicryl subcuticular stitch. Derma-bond was applied to both surgical incisions. The port reservoir was flushed and instilled with heparinized saline. No complications. FINDINGS: A right IJ vein Port-A-Cath is in place with its tip at the cavoatrial junction. COMPLICATIONS: None IMPRESSION:  Successful 8 French right internal jugular vein power port placement with its tip at the SVC/RA junction. Electronically Signed   By: Marybelle Killings M.D.   On: 02/01/2018 14:42    All questions were answered. The patient knows to call the clinic with any problems, questions or concerns. No barriers to learning was detected.  I spent 15 minutes counseling the patient face to face. The total time spent in the appointment was 20 minutes and more than 50% was on counseling and review of test results  [name], MD 02/15/2018 1:54 PM

## 2018-02-15 NOTE — Assessment & Plan Note (Signed)
I reinforced the importance of antiemetics and frequent oral hydration to avoid dehydration

## 2018-02-15 NOTE — Assessment & Plan Note (Signed)
She has asymptomatic HIV infection Infectious disease consultation is pending

## 2018-02-16 ENCOUNTER — Inpatient Hospital Stay: Payer: BLUE CROSS/BLUE SHIELD

## 2018-02-16 ENCOUNTER — Ambulatory Visit
Admission: RE | Admit: 2018-02-16 | Discharge: 2018-02-16 | Disposition: A | Discharge status: Home / Self Care | Payer: BLUE CROSS/BLUE SHIELD | Source: Ambulatory Visit | Attending: Radiation Oncology | Admitting: Radiation Oncology

## 2018-02-16 VITALS — BP 123/74 | HR 79 | Temp 98.7°F | Resp 18

## 2018-02-16 DIAGNOSIS — C530 Malignant neoplasm of endocervix: Secondary | ICD-10-CM | POA: Diagnosis not present

## 2018-02-16 MED ORDER — SODIUM CHLORIDE 0.9% FLUSH
10.0000 mL | INTRAVENOUS | Status: DC | PRN
  Administered 2018-02-16: 10 mL
  Filled 2018-02-16: qty 10

## 2018-02-16 MED ORDER — HEPARIN SOD (PORK) LOCK FLUSH 100 UNIT/ML IV SOLN
500.0000 [IU] | Freq: Once | INTRAVENOUS | Status: AC | PRN
  Administered 2018-02-16: 500 [IU]
  Filled 2018-02-16: qty 5

## 2018-02-16 MED ORDER — PALONOSETRON HCL INJECTION 0.25 MG/5ML
0.2500 mg | Freq: Once | INTRAVENOUS | Status: AC
  Administered 2018-02-16: 0.25 mg via INTRAVENOUS

## 2018-02-16 MED ORDER — FOSAPREPITANT DIMEGLUMINE INJECTION 150 MG
Freq: Once | INTRAVENOUS | Status: AC
  Administered 2018-02-16: 12:00:00 via INTRAVENOUS
  Filled 2018-02-16: qty 5

## 2018-02-16 MED ORDER — SODIUM CHLORIDE 0.9 % IV SOLN
36.0000 mg/m2 | Freq: Once | INTRAVENOUS | Status: AC
  Administered 2018-02-16: 75 mg via INTRAVENOUS
  Filled 2018-02-16: qty 75

## 2018-02-16 MED ORDER — POTASSIUM CHLORIDE 2 MEQ/ML IV SOLN
Freq: Once | INTRAVENOUS | Status: AC
  Administered 2018-02-16: 11:00:00 via INTRAVENOUS
  Filled 2018-02-16: qty 10

## 2018-02-16 MED ORDER — PALONOSETRON HCL INJECTION 0.25 MG/5ML
INTRAVENOUS | Status: AC
  Filled 2018-02-16: qty 5

## 2018-02-16 MED ORDER — SODIUM CHLORIDE 0.9 % IV SOLN
Freq: Once | INTRAVENOUS | Status: AC
  Administered 2018-02-16: 10:00:00 via INTRAVENOUS

## 2018-02-16 NOTE — Patient Instructions (Signed)
[facility] Discharge Instructions for Patients Receiving Chemotherapy  Today you received the following chemotherapy agents Cisplatin  To help prevent nausea and vomiting after your treatment, we encourage you to take your nausea medication as directed  If you develop nausea and vomiting that is not controlled by your nausea medication, call the clinic.   BELOW ARE SYMPTOMS THAT SHOULD BE REPORTED IMMEDIATELY:  *FEVER GREATER THAN 100.5 F  *CHILLS WITH OR WITHOUT FEVER  NAUSEA AND VOMITING THAT IS NOT CONTROLLED WITH YOUR NAUSEA MEDICATION  *UNUSUAL SHORTNESS OF BREATH  *UNUSUAL BRUISING OR BLEEDING  TENDERNESS IN MOUTH AND THROAT WITH OR WITHOUT PRESENCE OF ULCERS  *URINARY PROBLEMS  *BOWEL PROBLEMS  UNUSUAL RASH Items with * indicate a potential emergency and should be followed up as soon as possible.  Feel free to call the clinic should you have any questions or concerns. The clinic phone number is [phone].  Please show the CHEMO ALERT CARD at check-in to the Emergency Department and triage nurse.   

## 2018-02-19 ENCOUNTER — Other Ambulatory Visit: Payer: Self-pay | Admitting: *Deleted

## 2018-02-19 ENCOUNTER — Ambulatory Visit
Admission: RE | Admit: 2018-02-19 | Discharge: 2018-02-19 | Disposition: A | Discharge status: Home / Self Care | Payer: BLUE CROSS/BLUE SHIELD | Source: Ambulatory Visit | Attending: Radiation Oncology | Admitting: Radiation Oncology

## 2018-02-19 ENCOUNTER — Other Ambulatory Visit: Payer: BLUE CROSS/BLUE SHIELD

## 2018-02-19 ENCOUNTER — Ambulatory Visit: Payer: BLUE CROSS/BLUE SHIELD

## 2018-02-19 ENCOUNTER — Ambulatory Visit (INDEPENDENT_AMBULATORY_CARE_PROVIDER_SITE_OTHER): Payer: BLUE CROSS/BLUE SHIELD | Admitting: Licensed Clinical Social Worker

## 2018-02-19 ENCOUNTER — Other Ambulatory Visit (HOSPITAL_COMMUNITY)
Admission: RE | Admit: 2018-02-19 | Discharge: 2018-02-19 | Disposition: A | Discharge status: Home / Self Care | Payer: BLUE CROSS/BLUE SHIELD | Source: Ambulatory Visit | Attending: Internal Medicine | Admitting: Internal Medicine

## 2018-02-19 DIAGNOSIS — B20 Human immunodeficiency virus [HIV] disease: Secondary | ICD-10-CM

## 2018-02-19 DIAGNOSIS — F4323 Adjustment disorder with mixed anxiety and depressed mood: Secondary | ICD-10-CM | POA: Diagnosis not present

## 2018-02-19 DIAGNOSIS — Z113 Encounter for screening for infections with a predominantly sexual mode of transmission: Secondary | ICD-10-CM | POA: Insufficient documentation

## 2018-02-19 DIAGNOSIS — Z79899 Other long term (current) drug therapy: Secondary | ICD-10-CM

## 2018-02-19 NOTE — BH Specialist Note (Signed)
Integrated Behavioral Health Initial Visit  MRN: [id number] Name: Misty Thomas  Number of [place] Clinician visits:: 1/6 Session Start time: 4:42pm  Session End time: 5:07 pm Total time: 20 minutes  Type of Service: [place] Interpretor:No. Interpretor Name and Language: N/a   Warm Hand Off Completed.       SUBJECTIVE: Misty Thomas is a 52 y.o. female accompanied by Spouse Patient reports the following symptoms/concerns: diagnosed with HIV one week ago during treatment for cervical cancer; husband tested negative today Duration of problem: one week; Severity of problem: moderate  OBJECTIVE: Mood: Depressed and Affect: Blunt Risk of harm to self or others: No plan to harm self or others  LIFE CONTEXT: Family and Social: has been married 10 months, has an adult daughter, both are supportive but husband is the only one who knows about HIV status; family is supporting her through cancer treatment School/Work: currently out of work [employer]) for medical leave due to cancer treatment Life Changes: new marriage, found out she had cervical cancer 4-5 months ago and is undergoing daily (5x/week) infusions of radiation  GOALS ADDRESSED: Patient will: 1.   Increase knowledge and/or ability of: coping skills and adjustment to diagnosis   INTERVENTIONS: Interventions utilized: Supportive Counseling and Psychoeducation and/or Health Education   ASSESSMENT: Patient currently experiencing shock, anxiety, feelings of sadness and hopelessness, lack of energy (may be related to cancer treatment)     Patient may benefit from ongoing counseling for adjustment and adaptation to diagnosis.  PLAN: 1. Follow up with behavioral health clinician on : May 7 @ 8:45am 2. Behavioral recommendations: relaxation and self-care techniques to help ease adjustment and get through radiation.   Lillie Fragmin, LCSW

## 2018-02-20 ENCOUNTER — Ambulatory Visit
Admission: RE | Admit: 2018-02-20 | Discharge: 2018-02-20 | Disposition: A | Discharge status: Home / Self Care | Payer: BLUE CROSS/BLUE SHIELD | Source: Ambulatory Visit | Attending: Radiation Oncology | Admitting: Radiation Oncology

## 2018-02-20 LAB — URINALYSIS
BILIRUBIN URINE: NEGATIVE
Glucose, UA: NEGATIVE
HGB URINE DIPSTICK: NEGATIVE
Ketones, ur: NEGATIVE
Leukocytes, UA: NEGATIVE
NITRITE: NEGATIVE
PH: 5.5 (ref 5.0–8.0)
Protein, ur: NEGATIVE
SPECIFIC GRAVITY, URINE: 1.018 (ref 1.001–1.03)

## 2018-02-21 ENCOUNTER — Inpatient Hospital Stay: Payer: BLUE CROSS/BLUE SHIELD

## 2018-02-21 ENCOUNTER — Telehealth: Payer: Self-pay | Admitting: *Deleted

## 2018-02-21 ENCOUNTER — Other Ambulatory Visit: Payer: Self-pay | Admitting: Hematology

## 2018-02-21 ENCOUNTER — Ambulatory Visit
Admission: RE | Admit: 2018-02-21 | Discharge: 2018-02-21 | Disposition: A | Discharge status: Home / Self Care | Payer: BLUE CROSS/BLUE SHIELD | Source: Ambulatory Visit | Attending: Radiation Oncology | Admitting: Radiation Oncology

## 2018-02-21 DIAGNOSIS — C530 Malignant neoplasm of endocervix: Secondary | ICD-10-CM

## 2018-02-21 LAB — CBC WITH DIFFERENTIAL/PLATELET
Basophils Absolute: 0 10*3/uL (ref 0.0–0.1)
Basophils Relative: 0 %
Eosinophils Absolute: 0.1 10*3/uL (ref 0.0–0.5)
Eosinophils Relative: 2 %
HEMATOCRIT: 29 % — AB (ref 34.8–46.6)
HEMOGLOBIN: 9.8 g/dL — AB (ref 11.6–15.9)
LYMPHS ABS: 0.3 10*3/uL — AB (ref 0.9–3.3)
LYMPHS PCT: 8 %
MCH: 28.7 pg (ref 25.1–34.0)
MCHC: 33.7 g/dL (ref 31.5–36.0)
MCV: 85.1 fL (ref 79.5–101.0)
MONO ABS: 0.2 10*3/uL (ref 0.1–0.9)
MONOS PCT: 7 %
NEUTROS ABS: 2.6 10*3/uL (ref 1.5–6.5)
NEUTROS PCT: 83 %
Platelets: 334 10*3/uL (ref 145–400)
RBC: 3.4 MIL/uL — ABNORMAL LOW (ref 3.70–5.45)
RDW: 13.7 % (ref 11.2–14.5)
WBC: 3.2 10*3/uL — ABNORMAL LOW (ref 3.9–10.3)

## 2018-02-21 LAB — T-HELPER CELL (CD4) - (RCID CLINIC ONLY)
CD4 T CELL ABS: 130 /uL — AB (ref 400–2700)
CD4 T CELL HELPER: 36 % (ref 33–55)

## 2018-02-21 LAB — COMPREHENSIVE METABOLIC PANEL
ALBUMIN: 3.2 g/dL — AB (ref 3.5–5.0)
ALT: 13 U/L (ref 0–55)
ANION GAP: 5 (ref 3–11)
AST: 14 U/L (ref 5–34)
Alkaline Phosphatase: 88 U/L (ref 40–150)
BUN: 20 mg/dL (ref 7–26)
CHLORIDE: 103 mmol/L (ref 98–109)
CO2: 30 mmol/L — ABNORMAL HIGH (ref 22–29)
Calcium: 8.7 mg/dL (ref 8.4–10.4)
Creatinine, Ser: 0.97 mg/dL (ref 0.60–1.10)
GFR calc Af Amer: >60 mL/min (ref >60)
Glucose, Bld: 170 mg/dL — ABNORMAL HIGH (ref 70–140)
POTASSIUM: 3.5 mmol/L (ref 3.5–5.1)
Sodium: 138 mmol/L (ref 136–145)
Total Protein: 7.8 g/dL (ref 6.4–8.3)

## 2018-02-21 LAB — QUANTIFERON-TB GOLD PLUS
Mitogen-NIL: 4.75 IU/mL
NIL: 0.02 IU/mL
QuantiFERON-TB Gold Plus: NEGATIVE
TB1-NIL: 0.01 IU/mL
TB2-NIL: 0 [IU]/mL

## 2018-02-21 LAB — MAGNESIUM: MAGNESIUM: 1.4 mg/dL — AB (ref 1.7–2.4)

## 2018-02-21 LAB — URINE CYTOLOGY ANCILLARY ONLY
CHLAMYDIA, DNA PROBE: NEGATIVE
NEISSERIA GONORRHEA: NEGATIVE

## 2018-02-21 MED ORDER — MAGNESIUM OXIDE 400 (241.3 MG) MG PO TABS: 400.0000 mg | ORAL_TABLET | Freq: Every day | ORAL | 0 refills | Status: DC

## 2018-02-21 NOTE — Telephone Encounter (Signed)
Left message that Magnesium is low. Dr Burr Medico (on call) prescribed magnesium tablets to [place]. Encouraged patient in message to pick up prescription tonight. Has appt with Dr Alvy Bimler tomorrow.

## 2018-02-22 ENCOUNTER — Ambulatory Visit
Admission: RE | Admit: 2018-02-22 | Discharge: 2018-02-22 | Disposition: A | Discharge status: Home / Self Care | Payer: BLUE CROSS/BLUE SHIELD | Source: Ambulatory Visit | Attending: Radiation Oncology | Admitting: Radiation Oncology

## 2018-02-22 ENCOUNTER — Encounter: Payer: Self-pay | Admitting: Hematology and Oncology

## 2018-02-22 ENCOUNTER — Inpatient Hospital Stay (HOSPITAL_BASED_OUTPATIENT_CLINIC_OR_DEPARTMENT_OTHER): Payer: BLUE CROSS/BLUE SHIELD | Admitting: Hematology and Oncology

## 2018-02-22 DIAGNOSIS — R110 Nausea: Secondary | ICD-10-CM

## 2018-02-22 DIAGNOSIS — C530 Malignant neoplasm of endocervix: Secondary | ICD-10-CM

## 2018-02-22 DIAGNOSIS — Z21 Asymptomatic human immunodeficiency virus [HIV] infection status: Secondary | ICD-10-CM

## 2018-02-22 DIAGNOSIS — D61818 Other pancytopenia: Secondary | ICD-10-CM | POA: Diagnosis not present

## 2018-02-22 DIAGNOSIS — T451X5A Adverse effect of antineoplastic and immunosuppressive drugs, initial encounter: Secondary | ICD-10-CM

## 2018-02-22 DIAGNOSIS — G893 Neoplasm related pain (acute) (chronic): Secondary | ICD-10-CM | POA: Diagnosis not present

## 2018-02-22 DIAGNOSIS — R197 Diarrhea, unspecified: Secondary | ICD-10-CM

## 2018-02-22 MED ORDER — MORPHINE SULFATE 15 MG PO TABS: 15.0000 mg | ORAL_TABLET | ORAL | 0 refills | Status: DC | PRN

## 2018-02-22 MED FILL — MORPHINE SULFATE IR 15 MG T: 15 | 10 days supply | Qty: 60 | Fill #0

## 2018-02-22 NOTE — Assessment & Plan Note (Signed)
She tolerated treatment with expected side effects with nausea Her cancer pain is stable/improved We will continue treatment as scheduled without dose adjustment

## 2018-02-22 NOTE — Assessment & Plan Note (Signed)
She has minor diarrhea likely secondary to side effects of treatment She is able to hydrate herself regularly

## 2018-02-22 NOTE — Assessment & Plan Note (Signed)
She has asymptomatic HIV infection Infectious disease consultation is pending

## 2018-02-22 NOTE — Assessment & Plan Note (Signed)
Her cancer pain is better controlled with morphine sulfate I refilled her prescription today I warned her about side effects of nausea and constipation

## 2018-02-22 NOTE — Assessment & Plan Note (Signed)
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

## 2018-02-22 NOTE — Assessment & Plan Note (Signed)
She has low magnesium level likely secondary to cisplatin side effects of treatment and diarrhea She is prescribed magnesium sulfate replacement therapy

## 2018-02-22 NOTE — Progress Notes (Signed)
[place] OFFICE PROGRESS NOTE  Patient Care Team: Default, Provider, MD as PCP - General  ASSESSMENT & PLAN:  Cervical cancer [facility]) She tolerated treatment with expected side effects with nausea Her cancer pain is stable/improved We will continue treatment as scheduled without dose adjustment  HIV (human immunodeficiency virus infection) ([place]) She has asymptomatic HIV infection Infectious disease consultation is pending  Chemotherapy-induced nausea She continues to have persistent nausea I recommend scheduled antiemetics daily  Cancer associated pain Her cancer pain is better controlled with morphine sulfate I refilled her prescription today I warned her about side effects of nausea and constipation  Hypomagnesemia She has low magnesium level likely secondary to cisplatin side effects of treatment and diarrhea She is prescribed magnesium sulfate replacement therapy  Diarrhea She has minor diarrhea likely secondary to side effects of treatment She is able to hydrate herself regularly  Pancytopenia, acquired ([place]) She has progressive acquired pancytopenia secondary to side effects of treatment We will proceed with treatment without further dose adjustment She is not symptomatic   No orders of the defined types were placed in this encounter.   INTERVAL HISTORY: Please see below for problem oriented charting. She is seen prior to cycle 3 of chemotherapy Overall, she tolerated last cycle of treatment well with expected side effects such as nausea, diarrhea and persistent cancer pain Her nausea is well controlled with antiemetics She is able to hydrate herself adequately despite diarrhea She is taking 2 tablets of morphine sulfate twice a day with good pain control She denies vaginal bleeding Denies peripheral neuropathy  SUMMARY OF ONCOLOGIC HISTORY:   Cervical cancer ([place])   01/04/2017 Initial Diagnosis    She went to urgent care service for  evaluation of abnormal vaginal symptoms with discharge and was diagnosed with bacterial vaginosis      01/03/2018 Imaging    1. Complex thick-walled fluid collection/abscess the lower pelvis with possible communication to the lower uterine/vagina. Clinical correlation and further evaluation with pelvic ultrasound recommended. CT with IV and delayed oral and rectal contrast may provide additional information. A smaller collection is also noted in the left posterior hemipelvis the left of the rectum. 2. No bowel obstruction or active inflammation.  Normal appendix. Please note the described 5.0 x 5.5 cm complex collection within the pelvis appears to be in the region of the cervix and may represent a necrotic mass/neoplasm. The described 3 x 3 cm low attenuating lesion to the left of the rectum may represent an abnormal lymph node.       01/04/2018 Pathology Results    Cervix, biopsy - POORLY DIFFERENTIATED CARCINOMA - SEE COMMENT Microscopic Comment The biopsy material consists of multiple fragments of tissue with infiltrative tumor and granulation tissue. By immunohistochemistry, the tumor is positive for cytokeratin 5/6, p63, p16 and vimentin (diffusely positive) but negative for ER. 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      01/04/2018 Imaging    Pelvic US 1. Heterogeneous area of irregularity with Doppler flow in the region of the cervix. A cervical mass is not excluded. Clinical correlation is recommended.  2. Rounded complex mass/collection posterior to the lower uterus/vagina corresponding to the larger complex collection seen on the earlier CT. This may represent a necrotic or purulent collection. 3. Grossly unremarkable left ovary. Nonvisualization the right ovary.      01/04/2018 Procedure    She underwent examination under anesthesia Procedure: pt taken to the operating room where gen anesthesia  was performed. She was placed in  the dorsal lithotomy position and prepped and draped in the usual sterile fashion. A bivalve speculum was placed in the pts vagina and a macerated cervix was noted. There was very little that resembled a normal cervix. The tissue was necrotic and malodorous.  Several biopsies were obtained. There was actually very little bleeding from the biopsy sites although there was bleeding from within the uterine cavity above the location of the cervix.         01/22/2018 PET scan    1. Large hypermetabolic cervical mass with local invasion into the perirectal space. Bilateral pelvic adenopathy noted with small but hypermetabolic pelvic lymph nodes compatible with malignancy. I do not see definite hypermetabolic adenopathy in the upper abdomen, but there are scattered small bilateral hypermetabolic lymph nodes in the neck and axillary regions of uncertain significance. Given the low level activity and the skipped region in the abdomen, it is possible that the neck and chest lymph nodes are simply reactive, as usually I would expect to see more upper abdominal adenopathy were there involvement in the neck and chest. Surveillance is probably warranted. 2. Bilateral mild diffuse thyroid activity suggesting thyroiditis. 3. Other imaging findings of potential clinical significance: Aortic Atherosclerosis (ICD10-I70.0). Mild cardiomegaly. Bilateral iliac artery atherosclerosis. Suspected uterine fundal fibroid.       01/25/2018 Cancer Staging    Staging form: Cervix Uteri, AJCC 8th Edition - Clinical: FIGO Stage IVA (cT4, cN1, cM0) - Signed by [name], MD on 01/25/2018      02/01/2018 Procedure    Successful 8 French right internal jugular vein power port placement with its tip at the SVC/RA junction.      02/09/2018 -  Chemotherapy    The patient had weekly cisplatin       REVIEW OF SYSTEMS:   Constitutional: Denies fevers, chills or abnormal weight loss Eyes: Denies blurriness of vision Ears, nose,  mouth, throat, and face: Denies mucositis or sore throat Respiratory: Denies cough, dyspnea or wheezes Cardiovascular: Denies palpitation, chest discomfort or lower extremity swelling Skin: Denies abnormal skin rashes Lymphatics: Denies new lymphadenopathy or easy bruising Neurological:Denies numbness, tingling or new weaknesses Behavioral/Psych: Mood is stable, no new changes  All other systems were reviewed with the patient and are negative.  I have reviewed the past medical history, past surgical history, social history and family history with the patient and they are unchanged from previous note.  ALLERGIES:  has No Known Allergies.  MEDICATIONS:  Current Outpatient Medications  Medication Sig Dispense Refill  . docusate sodium (COLACE) 100 MG capsule Take 100 mg by mouth daily.    Marland Kitchen levothyroxine (SYNTHROID, LEVOTHROID) 25 MCG tablet Take 1 tablet (25 mcg total) by mouth daily before breakfast. 30 tablet 1  . lidocaine-prilocaine (EMLA) cream Apply to affected area once 30 g 3  . magnesium oxide (MAG-OX) 400 (241.3 Mg) MG tablet Take 1 tablet (400 mg total) by mouth daily. 30 tablet 0  . metFORMIN (GLUCOPHAGE) 500 MG tablet Take 250 mg by mouth 2 (two) times daily with a meal.    . Misc Natural Products (ESTROVEN + ENERGY MAX STRENGTH PO) Take 1 tablet by mouth daily.    Marland Kitchen morphine (MSIR) 15 MG tablet Take 1 tablet (15 mg total) by mouth every 4 (four) hours as needed for severe pain. 60 tablet 0  . ondansetron (ZOFRAN) 4 MG tablet Take 1 tablet (4 mg total) by mouth every 8 (eight) hours as needed for nausea  or vomiting. 20 tablet 0  . ondansetron (ZOFRAN) 8 MG tablet Take 1 tablet (8 mg total) by mouth every 8 (eight) hours as needed. 30 tablet 1  . prochlorperazine (COMPAZINE) 10 MG tablet Take 1 tablet (10 mg total) by mouth every 6 (six) hours as needed (Nausea or vomiting). 30 tablet 1   No current facility-administered medications for this visit.     PHYSICAL  EXAMINATION: ECOG PERFORMANCE STATUS: 1 - Symptomatic but completely ambulatory  Vitals:   02/22/18 1526  BP: 133/76  Pulse: 74  Resp: 18  Temp: 98.7 F (37.1 C)  SpO2: 100%   Filed Weights   02/22/18 1526  Weight: 218 lb 1.6 oz (98.9 kg)    GENERAL:alert, no distress and comfortable SKIN: skin color, texture, turgor are normal, no rashes or significant lesions EYES: normal, Conjunctiva are pink and non-injected, sclera clear OROPHARYNX:no exudate, no erythema and lips, buccal mucosa, and tongue normal  NECK: supple, thyroid normal size, non-tender, without nodularity LYMPH:  no palpable lymphadenopathy in the cervical, axillary or inguinal LUNGS: clear to auscultation and percussion with normal breathing effort HEART: regular rate & rhythm and no murmurs and no lower extremity edema ABDOMEN:abdomen soft, non-tender and normal bowel sounds Musculoskeletal:no cyanosis of digits and no clubbing  NEURO: alert & oriented x 3 with fluent speech, no focal motor/sensory deficits  LABORATORY DATA:  I have reviewed the data as listed    Component Value Date/Time   NA 138 02/21/2018 1453   K 3.5 02/21/2018 1453   CL 103 02/21/2018 1453   CO2 30 (H) 02/21/2018 1453   GLUCOSE 170 (H) 02/21/2018 1453   BUN 20 02/21/2018 1453   CREATININE 0.97 02/21/2018 1453   CREATININE 1.21 (H) 02/19/2018 1616   CALCIUM 8.7 02/21/2018 1453   PROT 7.8 02/21/2018 1453   ALBUMIN 3.2 (L) 02/21/2018 1453   AST 14 02/21/2018 1453   ALT 13 02/21/2018 1453   ALKPHOS 88 02/21/2018 1453   BILITOT <0.2 (L) 02/21/2018 1453   GFRNONAA >60 02/21/2018 1453   GFRNONAA 52 (L) 02/19/2018 1616   GFRAA >60 02/21/2018 1453   GFRAA 60 02/19/2018 1616    No results found for: SPEP, UPEP  Lab Results  Component Value Date   WBC 3.2 (L) 02/21/2018   NEUTROABS 2.6 02/21/2018   HGB 9.8 (L) 02/21/2018   HCT 29.0 (L) 02/21/2018   MCV 85.1 02/21/2018   PLT 334 02/21/2018      Chemistry      Component Value  Date/Time   NA 138 02/21/2018 1453   K 3.5 02/21/2018 1453   CL 103 02/21/2018 1453   CO2 30 (H) 02/21/2018 1453   BUN 20 02/21/2018 1453   CREATININE 0.97 02/21/2018 1453   CREATININE 1.21 (H) 02/19/2018 1616      Component Value Date/Time   CALCIUM 8.7 02/21/2018 1453   ALKPHOS 88 02/21/2018 1453   AST 14 02/21/2018 1453   ALT 13 02/21/2018 1453   BILITOT <0.2 (L) 02/21/2018 1453       RADIOGRAPHIC STUDIES: I have personally reviewed the radiological images as listed and agreed with the findings in the report. Ir Fluoro Guide Port Insertion Right  Result Date: 02/01/2018 CLINICAL DATA:  Cervical cancer EXAM: TUNNEL POWER PORT PLACEMENT WITH SUBCUTANEOUS POCKET UTILIZING ULTRASOUND & FLOUROSCOPY FLUOROSCOPY TIME:  30 seconds.  Four mGy. MEDICATIONS AND MEDICAL HISTORY: Versed 4 mg, Fentanyl 100 mcg. Additional Medications: Ancef 2 g. Versed 4 mg. Fentanyl 100 mcg. Antibiotics were given  within 2 hours of the procedure. ANESTHESIA/SEDATION: Moderate sedation time: 27 minutes. Nursing monitored the the patient during the procedure. PROCEDURE: After written informed consent was obtained, patient was placed in the supine position on angiographic table. The right neck and chest was prepped and draped in a sterile fashion. Lidocaine was utilized for local anesthesia. The right jugular vein was noted to be patent initially with ultrasound. Under sonographic guidance, a micropuncture needle was inserted into the right IJ vein (Ultrasound and fluoroscopic image documentation was performed). The needle was removed over an 018 wire which was exchanged for a Amplatz. This was advanced into the IVC. An 8-French dilator was advanced over the Amplatz. A small incision was made in the right upper chest over the anterior right second rib. Utilizing blunt dissection, a subcutaneous pocket was created in the caudal direction. The pocket was irrigated with a copious amount of sterile normal saline. The port  catheter was tunneled from the chest incision, and out the neck incision. The reservoir was inserted into the subcutaneous pocket and secured with two 3-0 Ethilon stitches. A peel-away sheath was advanced over the Amplatz wire. The port catheter was cut to measure length and inserted through the peel-away sheath. The peel-away sheath was removed. The chest incision was closed with 3-0 Vicryl interrupted stitches for the subcutaneous tissue and a running of 4-0 Vicryl subcuticular stitch for the skin. The neck incision was closed with a 4-0 Vicryl subcuticular stitch. Derma-bond was applied to both surgical incisions. The port reservoir was flushed and instilled with heparinized saline. No complications. FINDINGS: A right IJ vein Port-A-Cath is in place with its tip at the cavoatrial junction. COMPLICATIONS: None IMPRESSION: Successful 8 French right internal jugular vein power port placement with its tip at the SVC/RA junction. Electronically Signed   By: Marybelle Killings M.D.   On: 02/01/2018 14:42    All questions were answered. The patient knows to call the clinic with any problems, questions or concerns. No barriers to learning was detected.  I spent 20 minutes counseling the patient face to face. The total time spent in the appointment was 25 minutes and more than 50% was on counseling and review of test results  [name], MD 02/22/2018 3:36 PM

## 2018-02-22 NOTE — Assessment & Plan Note (Signed)
She continues to have persistent nausea I recommend scheduled antiemetics daily

## 2018-02-23 ENCOUNTER — Inpatient Hospital Stay: Payer: BLUE CROSS/BLUE SHIELD

## 2018-02-23 ENCOUNTER — Ambulatory Visit
Admission: RE | Admit: 2018-02-23 | Discharge: 2018-02-23 | Disposition: A | Discharge status: Home / Self Care | Payer: BLUE CROSS/BLUE SHIELD | Source: Ambulatory Visit | Attending: Radiation Oncology | Admitting: Radiation Oncology

## 2018-02-23 VITALS — BP 138/73 | HR 68 | Temp 98.5°F | Resp 18

## 2018-02-23 DIAGNOSIS — C530 Malignant neoplasm of endocervix: Secondary | ICD-10-CM | POA: Diagnosis not present

## 2018-02-23 MED ORDER — SODIUM CHLORIDE 0.9% FLUSH
10.0000 mL | INTRAVENOUS | Status: DC | PRN
  Administered 2018-02-23: 10 mL
  Filled 2018-02-23: qty 10

## 2018-02-23 MED ORDER — SODIUM CHLORIDE 0.9 % IV SOLN
36.0000 mg/m2 | Freq: Once | INTRAVENOUS | Status: AC
  Administered 2018-02-23: 75 mg via INTRAVENOUS
  Filled 2018-02-23: qty 75

## 2018-02-23 MED ORDER — PALONOSETRON HCL INJECTION 0.25 MG/5ML
INTRAVENOUS | Status: AC
  Filled 2018-02-23: qty 5

## 2018-02-23 MED ORDER — HEPARIN SOD (PORK) LOCK FLUSH 100 UNIT/ML IV SOLN
500.0000 [IU] | Freq: Once | INTRAVENOUS | Status: AC | PRN
  Administered 2018-02-23: 500 [IU]
  Filled 2018-02-23: qty 5

## 2018-02-23 MED ORDER — POTASSIUM CHLORIDE 2 MEQ/ML IV SOLN
Freq: Once | INTRAVENOUS | Status: AC
  Administered 2018-02-23: 10:00:00 via INTRAVENOUS
  Filled 2018-02-23: qty 10

## 2018-02-23 MED ORDER — SODIUM CHLORIDE 0.9 % IV SOLN
Freq: Once | INTRAVENOUS | Status: AC
  Administered 2018-02-23: 12:00:00 via INTRAVENOUS
  Filled 2018-02-23: qty 5

## 2018-02-23 MED ORDER — PALONOSETRON HCL INJECTION 0.25 MG/5ML
0.2500 mg | Freq: Once | INTRAVENOUS | Status: AC
  Administered 2018-02-23: 0.25 mg via INTRAVENOUS

## 2018-02-23 MED ORDER — SODIUM CHLORIDE 0.9 % IV SOLN
Freq: Once | INTRAVENOUS | Status: AC
  Administered 2018-02-23: 12:00:00 via INTRAVENOUS

## 2018-02-23 NOTE — Patient Instructions (Signed)
[facility] Discharge Instructions for Patients Receiving Chemotherapy  Today you received the following chemotherapy agents Cisplatin  To help prevent nausea and vomiting after your treatment, we encourage you to take your nausea medication as directed  If you develop nausea and vomiting that is not controlled by your nausea medication, call the clinic.   BELOW ARE SYMPTOMS THAT SHOULD BE REPORTED IMMEDIATELY:  *FEVER GREATER THAN 100.5 F  *CHILLS WITH OR WITHOUT FEVER  NAUSEA AND VOMITING THAT IS NOT CONTROLLED WITH YOUR NAUSEA MEDICATION  *UNUSUAL SHORTNESS OF BREATH  *UNUSUAL BRUISING OR BLEEDING  TENDERNESS IN MOUTH AND THROAT WITH OR WITHOUT PRESENCE OF ULCERS  *URINARY PROBLEMS  *BOWEL PROBLEMS  UNUSUAL RASH Items with * indicate a potential emergency and should be followed up as soon as possible.  Feel free to call the clinic should you have any questions or concerns. The clinic phone number is [phone].  Please show the CHEMO ALERT CARD at check-in to the Emergency Department and triage nurse.   

## 2018-02-26 ENCOUNTER — Ambulatory Visit
Admission: RE | Admit: 2018-02-26 | Discharge: 2018-02-26 | Disposition: A | Discharge status: Home / Self Care | Payer: BLUE CROSS/BLUE SHIELD | Source: Ambulatory Visit | Attending: Radiation Oncology | Admitting: Radiation Oncology

## 2018-02-27 ENCOUNTER — Ambulatory Visit
Admission: RE | Admit: 2018-02-27 | Discharge: 2018-02-27 | Disposition: A | Discharge status: Home / Self Care | Payer: BLUE CROSS/BLUE SHIELD | Source: Ambulatory Visit | Attending: Radiation Oncology | Admitting: Radiation Oncology

## 2018-02-27 DIAGNOSIS — R197 Diarrhea, unspecified: Secondary | ICD-10-CM | POA: Diagnosis not present

## 2018-02-27 DIAGNOSIS — R110 Nausea: Secondary | ICD-10-CM | POA: Diagnosis not present

## 2018-02-27 DIAGNOSIS — C530 Malignant neoplasm of endocervix: Secondary | ICD-10-CM | POA: Diagnosis present

## 2018-02-27 DIAGNOSIS — E038 Other specified hypothyroidism: Secondary | ICD-10-CM | POA: Diagnosis not present

## 2018-02-27 DIAGNOSIS — G893 Neoplasm related pain (acute) (chronic): Secondary | ICD-10-CM | POA: Diagnosis not present

## 2018-02-27 DIAGNOSIS — B20 Human immunodeficiency virus [HIV] disease: Secondary | ICD-10-CM | POA: Diagnosis not present

## 2018-02-27 DIAGNOSIS — E119 Type 2 diabetes mellitus without complications: Secondary | ICD-10-CM | POA: Diagnosis not present

## 2018-02-27 DIAGNOSIS — Z21 Asymptomatic human immunodeficiency virus [HIV] infection status: Secondary | ICD-10-CM | POA: Diagnosis not present

## 2018-02-27 DIAGNOSIS — D649 Anemia, unspecified: Secondary | ICD-10-CM | POA: Diagnosis not present

## 2018-02-27 DIAGNOSIS — Z5111 Encounter for antineoplastic chemotherapy: Secondary | ICD-10-CM | POA: Diagnosis not present

## 2018-02-28 ENCOUNTER — Ambulatory Visit
Admission: RE | Admit: 2018-02-28 | Discharge: 2018-02-28 | Disposition: A | Discharge status: Home / Self Care | Payer: BLUE CROSS/BLUE SHIELD | Source: Ambulatory Visit | Attending: Radiation Oncology | Admitting: Radiation Oncology

## 2018-02-28 ENCOUNTER — Inpatient Hospital Stay: Payer: BLUE CROSS/BLUE SHIELD

## 2018-02-28 DIAGNOSIS — C530 Malignant neoplasm of endocervix: Secondary | ICD-10-CM

## 2018-02-28 LAB — COMPREHENSIVE METABOLIC PANEL
ALT: 17 U/L (ref 0–55)
ANION GAP: 10 (ref 3–11)
AST: 15 U/L (ref 5–34)
Albumin: 3.5 g/dL (ref 3.5–5.0)
Alkaline Phosphatase: 77 U/L (ref 40–150)
BUN: 13 mg/dL (ref 7–26)
CHLORIDE: 99 mmol/L (ref 98–109)
CO2: 27 mmol/L (ref 22–29)
Calcium: 8.8 mg/dL (ref 8.4–10.4)
Creatinine, Ser: 1.01 mg/dL (ref 0.60–1.10)
GFR calc non Af Amer: >60 mL/min (ref >60)
Glucose, Bld: 144 mg/dL — ABNORMAL HIGH (ref 70–140)
Potassium: 3.9 mmol/L (ref 3.5–5.1)
SODIUM: 136 mmol/L (ref 136–145)
Total Bilirubin: 0.3 mg/dL (ref 0.2–1.2)
Total Protein: 8.1 g/dL (ref 6.4–8.3)

## 2018-02-28 LAB — CBC WITH DIFFERENTIAL/PLATELET
BASOS PCT: 0 %
Basophils Absolute: 0 10*3/uL (ref 0.0–0.1)
Eosinophils Absolute: 0.1 10*3/uL (ref 0.0–0.5)
Eosinophils Relative: 3 %
HEMATOCRIT: 30.1 % — AB (ref 34.8–46.6)
HEMOGLOBIN: 10.1 g/dL — AB (ref 11.6–15.9)
Lymphocytes Relative: 6 %
Lymphs Abs: 0.2 10*3/uL — ABNORMAL LOW (ref 0.9–3.3)
MCH: 28.5 pg (ref 25.1–34.0)
MCHC: 33.6 g/dL (ref 31.5–36.0)
MCV: 85 fL (ref 79.5–101.0)
MONOS PCT: 9 %
Monocytes Absolute: 0.3 10*3/uL (ref 0.1–0.9)
NEUTROS ABS: 2.4 10*3/uL (ref 1.5–6.5)
NEUTROS PCT: 82 %
Platelets: 355 10*3/uL (ref 145–400)
RBC: 3.55 MIL/uL — ABNORMAL LOW (ref 3.70–5.45)
RDW: 14.3 % (ref 11.2–14.5)
WBC: 2.9 10*3/uL — ABNORMAL LOW (ref 3.9–10.3)

## 2018-02-28 LAB — MAGNESIUM: MAGNESIUM: 1.4 mg/dL — AB (ref 1.7–2.4)

## 2018-02-28 NOTE — Progress Notes (Signed)
Received critical low mag level 1.4. Per Dr.Gorsuch notes, pt on magnesium supplementation at home.

## 2018-03-01 ENCOUNTER — Other Ambulatory Visit: Payer: Self-pay | Admitting: Radiation Oncology

## 2018-03-01 ENCOUNTER — Ambulatory Visit
Admission: RE | Admit: 2018-03-01 | Discharge: 2018-03-01 | Disposition: A | Discharge status: Home / Self Care | Payer: BLUE CROSS/BLUE SHIELD | Source: Ambulatory Visit | Attending: Radiation Oncology | Admitting: Radiation Oncology

## 2018-03-01 DIAGNOSIS — C539 Malignant neoplasm of cervix uteri, unspecified: Secondary | ICD-10-CM

## 2018-03-01 LAB — CBC WITH DIFFERENTIAL/PLATELET
BASOS ABS: 9 {cells}/uL (ref 0–200)
BASOS PCT: 0.2 %
EOS ABS: 52 {cells}/uL (ref 15–500)
EOS PCT: 1.1 %
HEMATOCRIT: 30 % — AB (ref 35.0–45.0)
HEMOGLOBIN: 9.9 g/dL — AB (ref 11.7–15.5)
Lymphs Abs: 273 cells/uL — ABNORMAL LOW (ref 850–3900)
MCH: 27.5 pg (ref 27.0–33.0)
MCHC: 33 g/dL (ref 32.0–36.0)
MCV: 83.3 fL (ref 80.0–100.0)
MONOS PCT: 8.4 %
MPV: 8.8 fL (ref 7.5–12.5)
NEUTROS PCT: 84.5 %
Neutro Abs: 3972 cells/uL (ref 1500–7800)
Platelets: 359 10*3/uL (ref 140–400)
RBC: 3.6 10*6/uL — AB (ref 3.80–5.10)
RDW: 13 % (ref 11.0–15.0)
TOTAL LYMPHOCYTE: 5.8 %
WBC: 4.7 10*3/uL (ref 3.8–10.8)
WBCMIX: 395 {cells}/uL (ref 200–950)

## 2018-03-01 LAB — HEPATITIS C ANTIBODY
HEP C AB: NONREACTIVE
SIGNAL TO CUT-OFF: 0.11 (ref <1.00)

## 2018-03-01 LAB — COMPLETE METABOLIC PANEL WITH GFR
AG Ratio: 0.9 (calc) — ABNORMAL LOW (ref 1.0–2.5)
ALT: 10 U/L (ref 6–29)
AST: 14 U/L (ref 10–35)
Albumin: 3.7 g/dL (ref 3.6–5.1)
Alkaline phosphatase (APISO): 79 U/L (ref 33–130)
BUN / CREAT RATIO: 22 (calc) (ref 6–22)
BUN: 27 mg/dL — ABNORMAL HIGH (ref 7–25)
CALCIUM: 8.8 mg/dL (ref 8.6–10.4)
CO2: 29 mmol/L (ref 20–32)
CREATININE: 1.21 mg/dL — AB (ref 0.50–1.05)
Chloride: 100 mmol/L (ref 98–110)
GFR, EST NON AFRICAN AMERICAN: 52 mL/min/{1.73_m2} — AB (ref >=60)
GFR, Est African American: 60 mL/min/{1.73_m2} (ref >=60)
GLOBULIN: 4.2 g/dL — AB (ref 1.9–3.7)
Glucose, Bld: 124 mg/dL — ABNORMAL HIGH (ref 65–99)
Potassium: 4 mmol/L (ref 3.5–5.3)
SODIUM: 135 mmol/L (ref 135–146)
Total Bilirubin: 0.2 mg/dL (ref 0.2–1.2)
Total Protein: 7.9 g/dL (ref 6.1–8.1)

## 2018-03-01 LAB — LIPID PANEL
CHOL/HDL RATIO: 4.3 (calc) (ref <5.0)
CHOLESTEROL: 218 mg/dL — AB (ref <200)
HDL: 51 mg/dL (ref >50)
LDL CHOLESTEROL (CALC): 127 mg/dL — AB
Non-HDL Cholesterol (Calc): 167 mg/dL (calc) — ABNORMAL HIGH (ref <130)
TRIGLYCERIDES: 247 mg/dL — AB (ref <150)

## 2018-03-01 LAB — HLA B*5701: HLA-B 5701 W/RFLX HLA-B HIGH: NEGATIVE

## 2018-03-01 LAB — HEPATITIS B CORE ANTIBODY, TOTAL: HEP B C TOTAL AB: NONREACTIVE

## 2018-03-01 LAB — RPR: RPR Ser Ql: NONREACTIVE

## 2018-03-01 LAB — HEPATITIS A ANTIBODY, TOTAL: Hepatitis A AB,Total: NONREACTIVE

## 2018-03-01 LAB — HEPATITIS B SURFACE ANTIGEN: HEP B S AG: NONREACTIVE

## 2018-03-01 LAB — HEPATITIS B SURFACE ANTIBODY,QUALITATIVE: Hep B S Ab: NONREACTIVE

## 2018-03-02 ENCOUNTER — Other Ambulatory Visit: Payer: Self-pay

## 2018-03-02 ENCOUNTER — Ambulatory Visit
Admission: RE | Admit: 2018-03-02 | Discharge: 2018-03-02 | Disposition: A | Discharge status: Home / Self Care | Payer: BLUE CROSS/BLUE SHIELD | Source: Ambulatory Visit | Attending: Radiation Oncology | Admitting: Radiation Oncology

## 2018-03-02 ENCOUNTER — Inpatient Hospital Stay: Payer: BLUE CROSS/BLUE SHIELD

## 2018-03-02 VITALS — BP 110/67 | HR 73 | Temp 98.8°F | Resp 16

## 2018-03-02 DIAGNOSIS — C530 Malignant neoplasm of endocervix: Secondary | ICD-10-CM | POA: Diagnosis not present

## 2018-03-02 MED ORDER — SODIUM CHLORIDE 0.9% FLUSH
10.0000 mL | INTRAVENOUS | Status: DC | PRN
  Administered 2018-03-02: 10 mL
  Filled 2018-03-02: qty 10

## 2018-03-02 MED ORDER — FOSAPREPITANT DIMEGLUMINE INJECTION 150 MG
Freq: Once | INTRAVENOUS | Status: AC
  Administered 2018-03-02: 12:00:00 via INTRAVENOUS
  Filled 2018-03-02: qty 5

## 2018-03-02 MED ORDER — POTASSIUM CHLORIDE 2 MEQ/ML IV SOLN
Freq: Once | INTRAVENOUS | Status: AC
  Administered 2018-03-02: 11:00:00 via INTRAVENOUS
  Filled 2018-03-02: qty 10

## 2018-03-02 MED ORDER — HEPARIN SOD (PORK) LOCK FLUSH 100 UNIT/ML IV SOLN
500.0000 [IU] | Freq: Once | INTRAVENOUS | Status: AC | PRN
  Administered 2018-03-02: 500 [IU]
  Filled 2018-03-02: qty 5

## 2018-03-02 MED ORDER — LIDOCAINE-PRILOCAINE 2.5-2.5 % EX CREA: TOPICAL_CREAM | CUTANEOUS | 3 refills | Status: DC

## 2018-03-02 MED ORDER — CISPLATIN CHEMO INJECTION 100MG/100ML
36.0000 mg/m2 | Freq: Once | INTRAVENOUS | Status: AC
  Administered 2018-03-02: 75 mg via INTRAVENOUS
  Filled 2018-03-02: qty 75

## 2018-03-02 MED ORDER — PALONOSETRON HCL INJECTION 0.25 MG/5ML
0.2500 mg | Freq: Once | INTRAVENOUS | Status: AC
  Administered 2018-03-02: 0.25 mg via INTRAVENOUS

## 2018-03-02 MED ORDER — PALONOSETRON HCL INJECTION 0.25 MG/5ML
INTRAVENOUS | Status: AC
  Filled 2018-03-02: qty 5

## 2018-03-02 MED ORDER — SODIUM CHLORIDE 0.9 % IV SOLN
Freq: Once | INTRAVENOUS | Status: AC
  Administered 2018-03-02: 10:00:00 via INTRAVENOUS

## 2018-03-02 MED FILL — LIDOCAINE-PRILOCAINE CREAM: 2.5-2.5 | 1 days supply | Qty: 30 | Fill #0

## 2018-03-02 NOTE — Patient Instructions (Signed)
[facility] Discharge Instructions for Patients Receiving Chemotherapy  Today you received the following chemotherapy agents Cisplatin  To help prevent nausea and vomiting after your treatment, we encourage you to take your nausea medication as directed  If you develop nausea and vomiting that is not controlled by your nausea medication, call the clinic.   BELOW ARE SYMPTOMS THAT SHOULD BE REPORTED IMMEDIATELY:  *FEVER GREATER THAN 100.5 F  *CHILLS WITH OR WITHOUT FEVER  NAUSEA AND VOMITING THAT IS NOT CONTROLLED WITH YOUR NAUSEA MEDICATION  *UNUSUAL SHORTNESS OF BREATH  *UNUSUAL BRUISING OR BLEEDING  TENDERNESS IN MOUTH AND THROAT WITH OR WITHOUT PRESENCE OF ULCERS  *URINARY PROBLEMS  *BOWEL PROBLEMS  UNUSUAL RASH Items with * indicate a potential emergency and should be followed up as soon as possible.  Feel free to call the clinic should you have any questions or concerns. The clinic phone number is [phone].  Please show the CHEMO ALERT CARD at check-in to the Emergency Department and triage nurse.   

## 2018-03-05 ENCOUNTER — Ambulatory Visit
Admission: RE | Admit: 2018-03-05 | Discharge: 2018-03-05 | Disposition: A | Discharge status: Home / Self Care | Payer: BLUE CROSS/BLUE SHIELD | Source: Ambulatory Visit | Attending: Radiation Oncology | Admitting: Radiation Oncology

## 2018-03-05 LAB — HIV-1 RNA ULTRAQUANT REFLEX TO GENTYP+
HIV 1 RNA Quant: 1260 Copies/mL — ABNORMAL HIGH
HIV-1 RNA QUANT, LOG: 3.1 {Log_copies}/mL — AB

## 2018-03-05 LAB — RFLX HIV-1 INTEGRASE GENOTYPE: HIV-1 GENOTYPE: DETECTED — AB

## 2018-03-06 ENCOUNTER — Encounter: Payer: BLUE CROSS/BLUE SHIELD | Admitting: Internal Medicine

## 2018-03-06 ENCOUNTER — Ambulatory Visit
Admission: RE | Admit: 2018-03-06 | Discharge: 2018-03-06 | Disposition: A | Discharge status: Home / Self Care | Payer: BLUE CROSS/BLUE SHIELD | Source: Ambulatory Visit | Attending: Radiation Oncology | Admitting: Radiation Oncology

## 2018-03-06 ENCOUNTER — Ambulatory Visit: Payer: BLUE CROSS/BLUE SHIELD

## 2018-03-07 ENCOUNTER — Telehealth: Payer: Self-pay

## 2018-03-07 ENCOUNTER — Ambulatory Visit
Admission: RE | Admit: 2018-03-07 | Discharge: 2018-03-07 | Disposition: A | Discharge status: Home / Self Care | Payer: BLUE CROSS/BLUE SHIELD | Source: Ambulatory Visit | Attending: Radiation Oncology | Admitting: Radiation Oncology

## 2018-03-07 ENCOUNTER — Inpatient Hospital Stay: Payer: Self-pay | Attending: Gynecologic Oncology

## 2018-03-07 DIAGNOSIS — D61810 Antineoplastic chemotherapy induced pancytopenia: Secondary | ICD-10-CM | POA: Insufficient documentation

## 2018-03-07 DIAGNOSIS — R110 Nausea: Secondary | ICD-10-CM | POA: Insufficient documentation

## 2018-03-07 DIAGNOSIS — Z510 Encounter for antineoplastic radiation therapy: Secondary | ICD-10-CM | POA: Insufficient documentation

## 2018-03-07 DIAGNOSIS — G893 Neoplasm related pain (acute) (chronic): Secondary | ICD-10-CM | POA: Insufficient documentation

## 2018-03-07 DIAGNOSIS — C530 Malignant neoplasm of endocervix: Secondary | ICD-10-CM | POA: Insufficient documentation

## 2018-03-07 DIAGNOSIS — Z21 Asymptomatic human immunodeficiency virus [HIV] infection status: Secondary | ICD-10-CM | POA: Insufficient documentation

## 2018-03-07 DIAGNOSIS — D61818 Other pancytopenia: Secondary | ICD-10-CM

## 2018-03-07 DIAGNOSIS — E039 Hypothyroidism, unspecified: Secondary | ICD-10-CM

## 2018-03-07 DIAGNOSIS — R946 Abnormal results of thyroid function studies: Secondary | ICD-10-CM

## 2018-03-07 HISTORY — DX: Other pancytopenia: D61.818

## 2018-03-07 HISTORY — DX: Hypomagnesemia: E83.42

## 2018-03-07 LAB — COMPREHENSIVE METABOLIC PANEL
ALK PHOS: 70 U/L (ref 40–150)
ALT: 14 U/L (ref 0–55)
ANION GAP: 9 (ref 3–11)
AST: 15 U/L (ref 5–34)
Albumin: 3.8 g/dL (ref 3.5–5.0)
BILIRUBIN TOTAL: 0.3 mg/dL (ref 0.2–1.2)
BUN: 16 mg/dL (ref 7–26)
CALCIUM: 8.9 mg/dL (ref 8.4–10.4)
CO2: 26 mmol/L (ref 22–29)
CREATININE: 0.93 mg/dL (ref 0.60–1.10)
Chloride: 102 mmol/L (ref 98–109)
GFR calc Af Amer: >60 mL/min (ref >60)
GFR calc non Af Amer: >60 mL/min (ref >60)
GLUCOSE: 135 mg/dL (ref 70–140)
Potassium: 3.5 mmol/L (ref 3.5–5.1)
Sodium: 137 mmol/L (ref 136–145)
TOTAL PROTEIN: 8.1 g/dL (ref 6.4–8.3)

## 2018-03-07 LAB — CBC WITH DIFFERENTIAL/PLATELET
BASOS PCT: 0 %
Basophils Absolute: 0 10*3/uL (ref 0.0–0.1)
EOS ABS: 0 10*3/uL (ref 0.0–0.5)
EOS PCT: 2 %
HCT: 29 % — ABNORMAL LOW (ref 34.8–46.6)
Hemoglobin: 9.7 g/dL — ABNORMAL LOW (ref 11.6–15.9)
Lymphocytes Relative: 5 %
Lymphs Abs: 0.1 10*3/uL — ABNORMAL LOW (ref 0.9–3.3)
MCH: 28.4 pg (ref 25.1–34.0)
MCHC: 33.5 g/dL (ref 31.5–36.0)
MCV: 84.8 fL (ref 79.5–101.0)
MONO ABS: 0.3 10*3/uL (ref 0.1–0.9)
Monocytes Relative: 12 %
NEUTROS ABS: 2.2 10*3/uL (ref 1.5–6.5)
Neutrophils Relative %: 81 %
PLATELETS: 324 10*3/uL (ref 145–400)
RBC: 3.42 MIL/uL — ABNORMAL LOW (ref 3.70–5.45)
RDW: 14.2 % (ref 11.2–14.5)
WBC: 2.7 10*3/uL — ABNORMAL LOW (ref 3.9–10.3)

## 2018-03-07 LAB — MAGNESIUM: Magnesium: 1.4 mg/dL — CL (ref 1.7–2.4)

## 2018-03-07 NOTE — Telephone Encounter (Signed)
Received panic level of Magnesium 1.4.  Notified Dr Lindi Adie, no new orders at this time.  Pt's last Magnesium was 1.4 on 4-24 and pt is taking oral Magnesium. I called pt, made her aware Magnesium level is same as last week, and she reports she is feeling ok, is eating, and verified with her that she has been taking her Magnesium  prescription about a week now.  Pt has appt with Dr Alvy Bimler tomorrow and reminded pt at this time of our contact info if needed. Reminded to call if feeling dehydrated, weak, fever, excessive diarrhea, or vomiting. Pt voiced understanding. No other needs per pt at this time.

## 2018-03-08 ENCOUNTER — Ambulatory Visit
Admission: RE | Admit: 2018-03-08 | Discharge: 2018-03-08 | Disposition: A | Discharge status: Home / Self Care | Payer: BLUE CROSS/BLUE SHIELD | Source: Ambulatory Visit | Attending: Radiation Oncology | Admitting: Radiation Oncology

## 2018-03-08 ENCOUNTER — Inpatient Hospital Stay (HOSPITAL_BASED_OUTPATIENT_CLINIC_OR_DEPARTMENT_OTHER): Payer: Self-pay | Admitting: Hematology and Oncology

## 2018-03-08 ENCOUNTER — Encounter: Payer: Self-pay | Admitting: Hematology and Oncology

## 2018-03-08 DIAGNOSIS — G893 Neoplasm related pain (acute) (chronic): Secondary | ICD-10-CM

## 2018-03-08 DIAGNOSIS — D61818 Other pancytopenia: Secondary | ICD-10-CM

## 2018-03-08 DIAGNOSIS — T451X5A Adverse effect of antineoplastic and immunosuppressive drugs, initial encounter: Secondary | ICD-10-CM

## 2018-03-08 DIAGNOSIS — C530 Malignant neoplasm of endocervix: Secondary | ICD-10-CM

## 2018-03-08 DIAGNOSIS — R110 Nausea: Secondary | ICD-10-CM

## 2018-03-08 LAB — TSH: TSH: 2.586 u[IU]/mL (ref 0.308–3.960)

## 2018-03-08 MED ORDER — PROCHLORPERAZINE MALEATE 10 MG PO TABS: 10.0000 mg | ORAL_TABLET | Freq: Four times a day (QID) | ORAL | 1 refills | Status: DC | PRN

## 2018-03-08 MED ORDER — MORPHINE SULFATE 15 MG PO TABS: 15.0000 mg | ORAL_TABLET | ORAL | 0 refills | Status: DC | PRN

## 2018-03-08 MED ORDER — ONDANSETRON HCL 8 MG PO TABS: 8.0000 mg | ORAL_TABLET | Freq: Three times a day (TID) | ORAL | 1 refills | Status: DC | PRN

## 2018-03-08 MED ORDER — MAGNESIUM OXIDE 400 (241.3 MG) MG PO TABS: 400.0000 mg | ORAL_TABLET | Freq: Two times a day (BID) | ORAL | 3 refills | Status: AC

## 2018-03-08 MED FILL — ONDANSETRON HCL 8 MG TABLET: 8 | 20 days supply | Qty: 60 | Fill #0

## 2018-03-08 MED FILL — MORPHINE SULFATE IR 15 MG T: 15 | 10 days supply | Qty: 60 | Fill #0

## 2018-03-08 MED FILL — PROCHLORPERAZINE 10 MG TAB: 10 | 15 days supply | Qty: 60 | Fill #0

## 2018-03-08 NOTE — Assessment & Plan Note (Signed)
Her cancer pain is better controlled with morphine sulfate I refilled her prescription today I warned her about side effects of nausea and constipation

## 2018-03-08 NOTE — Assessment & Plan Note (Signed)
She continues to have persistent nausea I recommend scheduled antiemetics daily

## 2018-03-08 NOTE — Progress Notes (Signed)
[place] OFFICE PROGRESS NOTE  Patient Care Team: Default, Provider, MD as PCP - General  ASSESSMENT & PLAN:  Cervical cancer [facility]) She tolerated treatment with expected side effects with nausea and pancytopenia Her cancer pain is stable/improved We will continue treatment as scheduled without dose adjustment  Pancytopenia, acquired ([place]) She has progressive acquired pancytopenia secondary to side effects of treatment We will proceed with treatment without further dose adjustment She is not symptomatic  Hypomagnesemia She has low magnesium level likely secondary to cisplatin side effects of treatment and diarrhea She is prescribed magnesium sulfate replacement therapy along with oral magnesium I will increase oral magnesium to twice a day  Chemotherapy-induced nausea She continues to have persistent nausea I recommend scheduled antiemetics daily  Cancer associated pain Her cancer pain is better controlled with morphine sulfate I refilled her prescription today I warned her about side effects of nausea and constipation   No orders of the defined types were placed in this encounter.   INTERVAL HISTORY: Please see below for problem oriented charting. She returns for cycle 5 of chemotherapy today She tolerated recent treatment well except for persistent nausea and low magnesium Her pain is well controlled She denies recent constipation No recent infection, fever or chills The patient denies any recent signs or symptoms of bleeding such as spontaneous epistaxis, hematuria or hematochezia. She denies peripheral neuropathy  SUMMARY OF ONCOLOGIC HISTORY:   Cervical cancer ([place])   01/04/2017 Initial Diagnosis    She went to urgent care service for evaluation of abnormal vaginal symptoms with discharge and was diagnosed with bacterial vaginosis      01/03/2018 Imaging    1. Complex thick-walled fluid collection/abscess the lower pelvis with possible  communication to the lower uterine/vagina. Clinical correlation and further evaluation with pelvic ultrasound recommended. CT with IV and delayed oral and rectal contrast may provide additional information. A smaller collection is also noted in the left posterior hemipelvis the left of the rectum. 2. No bowel obstruction or active inflammation.  Normal appendix. Please note the described 5.0 x 5.5 cm complex collection within the pelvis appears to be in the region of the cervix and may represent a necrotic mass/neoplasm. The described 3 x 3 cm low attenuating lesion to the left of the rectum may represent an abnormal lymph node.       01/04/2018 Pathology Results    Cervix, biopsy - POORLY DIFFERENTIATED CARCINOMA - SEE COMMENT Microscopic Comment The biopsy material consists of multiple fragments of tissue with infiltrative tumor and granulation tissue. By immunohistochemistry, the tumor is positive for cytokeratin 5/6, p63, p16 and vimentin (diffusely positive) but negative for ER. 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      01/04/2018 Imaging    Pelvic US 1. Heterogeneous area of irregularity with Doppler flow in the region of the cervix. A cervical mass is not excluded. Clinical correlation is recommended.  2. Rounded complex mass/collection posterior to the lower uterus/vagina corresponding to the larger complex collection seen on the earlier CT. This may represent a necrotic or purulent collection. 3. Grossly unremarkable left ovary. Nonvisualization the right ovary.      01/04/2018 Procedure    She underwent examination under anesthesia Procedure: pt taken to the operating room where gen anesthesia was performed. She was placed in the dorsal lithotomy position and prepped and draped in the usual sterile fashion. A bivalve speculum was placed in the pts vagina and a macerated cervix was  noted. There was very little that resembled a normal  cervix. The tissue was necrotic and malodorous.  Several biopsies were obtained. There was actually very little bleeding from the biopsy sites although there was bleeding from within the uterine cavity above the location of the cervix.         01/22/2018 PET scan    1. Large hypermetabolic cervical mass with local invasion into the perirectal space. Bilateral pelvic adenopathy noted with small but hypermetabolic pelvic lymph nodes compatible with malignancy. I do not see definite hypermetabolic adenopathy in the upper abdomen, but there are scattered small bilateral hypermetabolic lymph nodes in the neck and axillary regions of uncertain significance. Given the low level activity and the skipped region in the abdomen, it is possible that the neck and chest lymph nodes are simply reactive, as usually I would expect to see more upper abdominal adenopathy were there involvement in the neck and chest. Surveillance is probably warranted. 2. Bilateral mild diffuse thyroid activity suggesting thyroiditis. 3. Other imaging findings of potential clinical significance: Aortic Atherosclerosis (ICD10-I70.0). Mild cardiomegaly. Bilateral iliac artery atherosclerosis. Suspected uterine fundal fibroid.       01/25/2018 Cancer Staging    Staging form: Cervix Uteri, AJCC 8th Edition - Clinical: FIGO Stage IVA (cT4, cN1, cM0) - Signed by [name], MD on 01/25/2018      02/01/2018 Procedure    Successful 8 French right internal jugular vein power port placement with its tip at the SVC/RA junction.      02/09/2018 -  Chemotherapy    The patient had weekly cisplatin       REVIEW OF SYSTEMS:   Constitutional: Denies fevers, chills or abnormal weight loss Eyes: Denies blurriness of vision Ears, nose, mouth, throat, and face: Denies mucositis or sore throat Respiratory: Denies cough, dyspnea or wheezes Cardiovascular: Denies palpitation, chest discomfort or lower extremity swelling Gastrointestinal:  Denies  nausea, heartburn or change in bowel habits Skin: Denies abnormal skin rashes Lymphatics: Denies new lymphadenopathy or easy bruising Neurological:Denies numbness, tingling or new weaknesses Behavioral/Psych: Mood is stable, no new changes  All other systems were reviewed with the patient and are negative.  I have reviewed the past medical history, past surgical history, social history and family history with the patient and they are unchanged from previous note.  ALLERGIES:  has No Known Allergies.  MEDICATIONS:  Current Outpatient Medications  Medication Sig Dispense Refill  . docusate sodium (COLACE) 100 MG capsule Take 100 mg by mouth daily.    Marland Kitchen levothyroxine (SYNTHROID, LEVOTHROID) 25 MCG tablet Take 1 tablet (25 mcg total) by mouth daily before breakfast. 30 tablet 1  . lidocaine-prilocaine (EMLA) cream Apply to affected area once 30 g 3  . magnesium oxide (MAG-OX) 400 (241.3 Mg) MG tablet Take 1 tablet (400 mg total) by mouth 2 (two) times daily. 60 tablet 3  . metFORMIN (GLUCOPHAGE) 500 MG tablet Take 250 mg by mouth 2 (two) times daily with a meal.    . Misc Natural Products (ESTROVEN + ENERGY MAX STRENGTH PO) Take 1 tablet by mouth daily.    Marland Kitchen morphine (MSIR) 15 MG tablet Take 1 tablet (15 mg total) by mouth every 4 (four) hours as needed for severe pain. 60 tablet 0  . ondansetron (ZOFRAN) 4 MG tablet Take 1 tablet (4 mg total) by mouth every 8 (eight) hours as needed for nausea or vomiting. 20 tablet 0  . ondansetron (ZOFRAN) 8 MG tablet Take 1 tablet (8 mg total) by mouth  every 8 (eight) hours as needed. 60 tablet 1  . prochlorperazine (COMPAZINE) 10 MG tablet Take 1 tablet (10 mg total) by mouth every 6 (six) hours as needed (Nausea or vomiting). 60 tablet 1   No current facility-administered medications for this visit.     PHYSICAL EXAMINATION: ECOG PERFORMANCE STATUS: 1 - Symptomatic but completely ambulatory  Vitals:   03/08/18 1529  BP: 127/85  Pulse: 86  Resp:  18  Temp: 98.6 F (37 C)  SpO2: 100%   Filed Weights   03/08/18 1529  Weight: 216 lb 4.8 oz (98.1 kg)    GENERAL:alert, no distress and comfortable SKIN: skin color, texture, turgor are normal, no rashes or significant lesions EYES: normal, Conjunctiva are pink and non-injected, sclera clear OROPHARYNX:no exudate, no erythema and lips, buccal mucosa, and tongue normal  NECK: supple, thyroid normal size, non-tender, without nodularity LYMPH:  no palpable lymphadenopathy in the cervical, axillary or inguinal LUNGS: clear to auscultation and percussion with normal breathing effort HEART: regular rate & rhythm and no murmurs and no lower extremity edema ABDOMEN:abdomen soft, non-tender and normal bowel sounds Musculoskeletal:no cyanosis of digits and no clubbing  NEURO: alert & oriented x 3 with fluent speech, no focal motor/sensory deficits  LABORATORY DATA:  I have reviewed the data as listed    Component Value Date/Time   NA 137 03/07/2018 1546   K 3.5 03/07/2018 1546   CL 102 03/07/2018 1546   CO2 26 03/07/2018 1546   GLUCOSE 135 03/07/2018 1546   BUN 16 03/07/2018 1546   CREATININE 0.93 03/07/2018 1546   CREATININE 1.21 (H) 02/19/2018 1616   CALCIUM 8.9 03/07/2018 1546   PROT 8.1 03/07/2018 1546   ALBUMIN 3.8 03/07/2018 1546   AST 15 03/07/2018 1546   ALT 14 03/07/2018 1546   ALKPHOS 70 03/07/2018 1546   BILITOT 0.3 03/07/2018 1546   GFRNONAA >60 03/07/2018 1546   GFRNONAA 52 (L) 02/19/2018 1616   GFRAA >60 03/07/2018 1546   GFRAA 60 02/19/2018 1616    No results found for: SPEP, UPEP  Lab Results  Component Value Date   WBC 2.7 (L) 03/07/2018   NEUTROABS 2.2 03/07/2018   HGB 9.7 (L) 03/07/2018   HCT 29.0 (L) 03/07/2018   MCV 84.8 03/07/2018   PLT 324 03/07/2018      Chemistry      Component Value Date/Time   NA 137 03/07/2018 1546   K 3.5 03/07/2018 1546   CL 102 03/07/2018 1546   CO2 26 03/07/2018 1546   BUN 16 03/07/2018 1546   CREATININE 0.93  03/07/2018 1546   CREATININE 1.21 (H) 02/19/2018 1616      Component Value Date/Time   CALCIUM 8.9 03/07/2018 1546   ALKPHOS 70 03/07/2018 1546   AST 15 03/07/2018 1546   ALT 14 03/07/2018 1546   BILITOT 0.3 03/07/2018 1546       All questions were answered. The patient knows to call the clinic with any problems, questions or concerns. No barriers to learning was detected.  I spent 15 minutes counseling the patient face to face. The total time spent in the appointment was 20 minutes and more than 50% was on counseling and review of test results  [name], MD 03/08/2018 3:40 PM

## 2018-03-08 NOTE — Assessment & Plan Note (Signed)
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

## 2018-03-08 NOTE — Assessment & Plan Note (Signed)
She has low magnesium level likely secondary to cisplatin side effects of treatment and diarrhea She is prescribed magnesium sulfate replacement therapy along with oral magnesium I will increase oral magnesium to twice a day

## 2018-03-08 NOTE — Assessment & Plan Note (Signed)
She tolerated treatment with expected side effects with nausea and pancytopenia Her cancer pain is stable/improved We will continue treatment as scheduled without dose adjustment

## 2018-03-09 ENCOUNTER — Inpatient Hospital Stay: Payer: Self-pay

## 2018-03-09 ENCOUNTER — Ambulatory Visit
Admission: RE | Admit: 2018-03-09 | Discharge: 2018-03-09 | Disposition: A | Discharge status: Home / Self Care | Payer: BLUE CROSS/BLUE SHIELD | Source: Ambulatory Visit | Attending: Radiation Oncology | Admitting: Radiation Oncology

## 2018-03-09 DIAGNOSIS — C530 Malignant neoplasm of endocervix: Secondary | ICD-10-CM

## 2018-03-09 MED ORDER — SODIUM CHLORIDE 0.9 % IV SOLN
Freq: Once | INTRAVENOUS | Status: AC
  Administered 2018-03-09: 13:00:00 via INTRAVENOUS
  Filled 2018-03-09: qty 5

## 2018-03-09 MED ORDER — PALONOSETRON HCL INJECTION 0.25 MG/5ML
INTRAVENOUS | Status: AC
  Filled 2018-03-09: qty 5

## 2018-03-09 MED ORDER — HEPARIN SOD (PORK) LOCK FLUSH 100 UNIT/ML IV SOLN
500.0000 [IU] | Freq: Once | INTRAVENOUS | Status: AC | PRN
  Administered 2018-03-09: 500 [IU]
  Filled 2018-03-09: qty 5

## 2018-03-09 MED ORDER — POTASSIUM CHLORIDE 2 MEQ/ML IV SOLN
Freq: Once | INTRAVENOUS | Status: AC
  Administered 2018-03-09: 10:00:00 via INTRAVENOUS
  Filled 2018-03-09: qty 10

## 2018-03-09 MED ORDER — PALONOSETRON HCL INJECTION 0.25 MG/5ML
0.2500 mg | Freq: Once | INTRAVENOUS | Status: AC
  Administered 2018-03-09: 0.25 mg via INTRAVENOUS

## 2018-03-09 MED ORDER — SODIUM CHLORIDE 0.9 % IV SOLN
36.0000 mg/m2 | Freq: Once | INTRAVENOUS | Status: AC
  Administered 2018-03-09: 75 mg via INTRAVENOUS
  Filled 2018-03-09: qty 75

## 2018-03-09 MED ORDER — SODIUM CHLORIDE 0.9 % IV SOLN
Freq: Once | INTRAVENOUS | Status: AC
  Administered 2018-03-09: 10:00:00 via INTRAVENOUS

## 2018-03-09 MED ORDER — SODIUM CHLORIDE 0.9% FLUSH
10.0000 mL | INTRAVENOUS | Status: DC | PRN
  Administered 2018-03-09: 10 mL
  Filled 2018-03-09: qty 10

## 2018-03-09 NOTE — Patient Instructions (Signed)
[facility] Discharge Instructions for Patients Receiving Chemotherapy  Today you received the following chemotherapy agents Cisplatin  To help prevent nausea and vomiting after your treatment, we encourage you to take your nausea medication as directed  If you develop nausea and vomiting that is not controlled by your nausea medication, call the clinic.   BELOW ARE SYMPTOMS THAT SHOULD BE REPORTED IMMEDIATELY:  *FEVER GREATER THAN 100.5 F  *CHILLS WITH OR WITHOUT FEVER  NAUSEA AND VOMITING THAT IS NOT CONTROLLED WITH YOUR NAUSEA MEDICATION  *UNUSUAL SHORTNESS OF BREATH  *UNUSUAL BRUISING OR BLEEDING  TENDERNESS IN MOUTH AND THROAT WITH OR WITHOUT PRESENCE OF ULCERS  *URINARY PROBLEMS  *BOWEL PROBLEMS  UNUSUAL RASH Items with * indicate a potential emergency and should be followed up as soon as possible.  Feel free to call the clinic should you have any questions or concerns. The clinic phone number is [phone].  Please show the CHEMO ALERT CARD at check-in to the Emergency Department and triage nurse.   

## 2018-03-12 ENCOUNTER — Ambulatory Visit
Admission: RE | Admit: 2018-03-12 | Discharge: 2018-03-12 | Disposition: A | Discharge status: Home / Self Care | Payer: BLUE CROSS/BLUE SHIELD | Source: Ambulatory Visit | Attending: Radiation Oncology | Admitting: Radiation Oncology

## 2018-03-13 ENCOUNTER — Encounter: Payer: Self-pay | Admitting: Radiation Oncology

## 2018-03-13 ENCOUNTER — Ambulatory Visit (INDEPENDENT_AMBULATORY_CARE_PROVIDER_SITE_OTHER): Payer: Self-pay | Admitting: Licensed Clinical Social Worker

## 2018-03-13 ENCOUNTER — Ambulatory Visit
Admission: RE | Admit: 2018-03-13 | Discharge: 2018-03-13 | Disposition: A | Discharge status: Home / Self Care | Payer: BLUE CROSS/BLUE SHIELD | Source: Ambulatory Visit | Attending: Radiation Oncology | Admitting: Radiation Oncology

## 2018-03-13 DIAGNOSIS — C530 Malignant neoplasm of endocervix: Secondary | ICD-10-CM

## 2018-03-13 DIAGNOSIS — F4323 Adjustment disorder with mixed anxiety and depressed mood: Secondary | ICD-10-CM

## 2018-03-13 MED ORDER — RADIAPLEXRX EX GEL
Freq: Once | CUTANEOUS | Status: AC
  Administered 2018-03-13: 16:00:00 via TOPICAL

## 2018-03-13 NOTE — Progress Notes (Signed)
Received FMLA paperwork from patient today--giving them to Stacy to process--informed patient it will be 5-10 business days to complete

## 2018-03-13 NOTE — BH Specialist Note (Signed)
Integrated Behavioral Health Follow Up Visit  MRN: [id number] Name: Misty Thomas  Number of [place] Clinician visits: 2/6 Session Start time: 8:58am  Session End time: 9:46am Total time: 45 minutes  Type of Service: Spray Interpretor:No. Interpretor Name and Language: n/a  SUBJECTIVE: Misty Thomas is a 52 y.o. 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 He is very supportive, helping her to keep a positive attitude and encouraging her to share her thoughts and feelings with him without pressuring her to do so any quicker than she feels comfortable with. Patient's 60 year old son with Autism is living in the home and seems to "sense that something is going on" but does not know about her cancer or HIV status.  School/Work: Continues to be on medical leave for cancer treatment  GOALS ADDRESSED: Patient will: 1.  Reduce symptoms of: anxiety  2.  Increase knowledge and/or ability of: communication of thoughts/feelings.    INTERVENTIONS: Interventions utilized:  Brief CBT  ASSESSMENT: Patient is currently experiencing anxious thoughts and fears on some days, and has not shared much about her thoughts or feelings with husband. He reports that he knows she is "fighting the battle in her mind" and will talk to him about it when she is ready. Counselor guided patient to identify the source of her fears and she  shared fears about the upcoming ring insertion for radiation treatment and not knowing what to expect. Husband verbalized that he did not know about these fears, and  expressed intention to ask the doctor about them at today's appointment. Counselor educated patient and husband on ways in which anxiety can fester when it is not talked about, and explored with them ways that this can be harmful. Counselor used the cognitive triangle to illustrate to patient and husband ways that sharing fears can help patient think and feel differently.   Patient may benefit from continuing counseling and advocacy.  PLAN: Behavioral recommendations: 1. Advocate with radiation doctor for information about side effects and expectations.  2. Share with husband thoughts/fears related to care are illness.    Lillie Fragmin, LCSW

## 2018-03-14 ENCOUNTER — Other Ambulatory Visit: Payer: Self-pay

## 2018-03-14 ENCOUNTER — Inpatient Hospital Stay: Payer: Self-pay

## 2018-03-14 ENCOUNTER — Ambulatory Visit
Admission: RE | Admit: 2018-03-14 | Discharge: 2018-03-14 | Disposition: A | Discharge status: Home / Self Care | Payer: BLUE CROSS/BLUE SHIELD | Source: Ambulatory Visit | Attending: Radiation Oncology | Admitting: Radiation Oncology

## 2018-03-14 ENCOUNTER — Telehealth: Payer: Self-pay

## 2018-03-14 ENCOUNTER — Other Ambulatory Visit: Payer: Self-pay | Admitting: *Deleted

## 2018-03-14 DIAGNOSIS — C530 Malignant neoplasm of endocervix: Secondary | ICD-10-CM

## 2018-03-14 LAB — COMPREHENSIVE METABOLIC PANEL
ALBUMIN: 3.6 g/dL (ref 3.5–5.0)
ALT: 18 U/L (ref 0–55)
AST: 16 U/L (ref 5–34)
Alkaline Phosphatase: 76 U/L (ref 40–150)
Anion gap: 7 (ref 3–11)
BUN: 14 mg/dL (ref 7–26)
CALCIUM: 8.9 mg/dL (ref 8.4–10.4)
CHLORIDE: 102 mmol/L (ref 98–109)
CO2: 27 mmol/L (ref 22–29)
Creatinine, Ser: 1 mg/dL (ref 0.60–1.10)
GFR calc Af Amer: >60 mL/min (ref >60)
GFR calc non Af Amer: >60 mL/min (ref >60)
Glucose, Bld: 117 mg/dL (ref 70–140)
POTASSIUM: 4.2 mmol/L (ref 3.5–5.1)
SODIUM: 136 mmol/L (ref 136–145)
Total Bilirubin: 0.3 mg/dL (ref 0.2–1.2)
Total Protein: 7.8 g/dL (ref 6.4–8.3)

## 2018-03-14 LAB — CBC WITH DIFFERENTIAL/PLATELET
Basophils Absolute: 0 10*3/uL (ref 0.0–0.1)
Basophils Relative: 0 %
EOS PCT: 2 %
Eosinophils Absolute: 0 10*3/uL (ref 0.0–0.5)
HEMATOCRIT: 28.1 % — AB (ref 34.8–46.6)
Hemoglobin: 9.5 g/dL — ABNORMAL LOW (ref 11.6–15.9)
LYMPHS PCT: 7 %
Lymphs Abs: 0.1 10*3/uL — ABNORMAL LOW (ref 0.9–3.3)
MCH: 28.6 pg (ref 25.1–34.0)
MCHC: 33.7 g/dL (ref 31.5–36.0)
MCV: 85 fL (ref 79.5–101.0)
Monocytes Absolute: 0.3 10*3/uL (ref 0.1–0.9)
Monocytes Relative: 15 %
NEUTROS ABS: 1.4 10*3/uL — AB (ref 1.5–6.5)
NEUTROS PCT: 76 %
PLATELETS: 285 10*3/uL (ref 145–400)
RBC: 3.3 MIL/uL — AB (ref 3.70–5.45)
RDW: 14.8 % — ABNORMAL HIGH (ref 11.2–14.5)
WBC: 1.8 10*3/uL — AB (ref 3.9–10.3)

## 2018-03-14 LAB — MAGNESIUM: MAGNESIUM: 1.1 mg/dL — AB (ref 1.7–2.4)

## 2018-03-14 NOTE — Telephone Encounter (Signed)
Received lab panic level for Magnesium of 1.1.  Notified Dr Lindi Adie, per him, pt needs IV Magnesium 2 gm in infusion tomorrow with her Dr Alvy Bimler scheduled appt. Notified pt via phone, she voiced understanding.  She denies any vomiting or diarrhea and has been taking her oral Magnesium.  I called Melanie RN AD and will send an urgent message to scheduling to schedule pt after her radiation appt.

## 2018-03-15 ENCOUNTER — Inpatient Hospital Stay (HOSPITAL_BASED_OUTPATIENT_CLINIC_OR_DEPARTMENT_OTHER): Payer: Self-pay | Admitting: Hematology and Oncology

## 2018-03-15 ENCOUNTER — Inpatient Hospital Stay: Payer: Self-pay

## 2018-03-15 ENCOUNTER — Other Ambulatory Visit: Payer: Self-pay | Admitting: Hematology and Oncology

## 2018-03-15 ENCOUNTER — Ambulatory Visit
Admission: RE | Admit: 2018-03-15 | Discharge: 2018-03-15 | Disposition: A | Discharge status: Home / Self Care | Payer: BLUE CROSS/BLUE SHIELD | Source: Ambulatory Visit | Attending: Radiation Oncology | Admitting: Radiation Oncology

## 2018-03-15 VITALS — BP 120/80 | HR 88 | Temp 100.6°F | Resp 18

## 2018-03-15 DIAGNOSIS — C530 Malignant neoplasm of endocervix: Secondary | ICD-10-CM

## 2018-03-15 DIAGNOSIS — D61818 Other pancytopenia: Secondary | ICD-10-CM

## 2018-03-15 MED ORDER — SODIUM CHLORIDE 0.9% FLUSH
10.0000 mL | Freq: Once | INTRAVENOUS | Status: AC
  Administered 2018-03-15: 10 mL
  Filled 2018-03-15: qty 10

## 2018-03-15 MED ORDER — SODIUM CHLORIDE 0.9 % IV SOLN
Freq: Once | INTRAVENOUS | Status: AC
  Administered 2018-03-15: 16:00:00 via INTRAVENOUS

## 2018-03-15 MED ORDER — TBO-FILGRASTIM 300 MCG/0.5ML ~~LOC~~ SOSY
PREFILLED_SYRINGE | SUBCUTANEOUS | Status: AC
  Filled 2018-03-15: qty 0.5

## 2018-03-15 MED ORDER — MAGNESIUM SULFATE 50 % IJ SOLN: 2000.0000 mg | Freq: Once | INTRAVENOUS | Status: DC

## 2018-03-15 MED ORDER — TBO-FILGRASTIM 300 MCG/0.5ML ~~LOC~~ SOSY
300.0000 ug | PREFILLED_SYRINGE | Freq: Once | SUBCUTANEOUS | Status: AC
  Administered 2018-03-15: 300 ug via SUBCUTANEOUS

## 2018-03-15 MED ORDER — HEPARIN SOD (PORK) LOCK FLUSH 100 UNIT/ML IV SOLN
500.0000 [IU] | Freq: Once | INTRAVENOUS | Status: AC
  Administered 2018-03-15: 500 [IU]
  Filled 2018-03-15: qty 5

## 2018-03-15 MED ORDER — SODIUM CHLORIDE 0.9 % IV SOLN
Freq: Once | INTRAVENOUS | Status: AC
  Administered 2018-03-15: 16:00:00 via INTRAVENOUS
  Filled 2018-03-15: qty 100

## 2018-03-15 NOTE — Patient Instructions (Signed)
Hypomagnesemia  Hypomagnesemia is a condition in which the level of magnesium in the blood is low. Magnesium is a mineral that is found in many foods. It is used in many different processes in the body. Hypomagnesemia can affect every organ in the body. It can cause life-threatening problems.  What are the causes?  Causes of hypomagnesemia include:   Not getting enough magnesium in your diet.   Malnutrition.   Problems with absorbing magnesium from the intestines.   Dehydration.   Alcohol abuse.   Vomiting.   Severe diarrhea.   Some medicines, including medicines that make you urinate more.   Certain diseases, such as kidney disease, diabetes, and overactive thyroid.    What are the signs or symptoms?   Involuntary shaking or trembling of a body part (tremor).   Confusion.   Muscle weakness.   Sensitivity to light, sound, and touch.   Psychiatric issues, such as depression, irritability, or psychosis.   Sudden tightening of muscles (muscle spasms).   Tingling in the arms and legs.   A feeling of fluttering of the heart.  These symptoms are more severe if magnesium levels drop suddenly.  How is this diagnosed?  To make a diagnosis, your health care provider will do a physical exam and order blood and urine tests.  How is this treated?  Treatment will depend on the cause and the severity of your condition. It may involve:   A magnesium supplement. This can be taken in pill form. It can also be given through an IV tube. This is usually done if the condition is severe.   Changes to your diet. You may be directed to eat foods that have a lot of magnesium, such as green leafy vegetables, peas, beans, and nuts.   Eliminating alcohol from your diet.    Follow these instructions at home:   Include foods with magnesium in your diet. Foods that are rich in magnesium include green vegetables, beans, nuts and seeds, and whole grains.   Take medicines only as directed by your health care provider.   Take  magnesium supplements if your health care provider instructs you to do that. Take them as directed.   Have your magnesium levels monitored as directed by your health care provider.   When you are active, drink fluids that contain electrolytes.   Keep all follow-up visits as directed by your health care provider. This is important.  Contact a health care provider if:   You get worse instead of better.   Your symptoms return.  Get help right away if:   Your symptoms are severe.  This information is not intended to replace advice given to you by your health care provider. Make sure you discuss any questions you have with your health care provider.  Document Released: 07/20/2005 Document Revised: 03/31/2016 Document Reviewed: 06/09/2014  Elsevier Interactive Patient Education  2018 [employer]

## 2018-03-16 ENCOUNTER — Inpatient Hospital Stay: Payer: Self-pay

## 2018-03-16 ENCOUNTER — Telehealth: Payer: Self-pay | Admitting: *Deleted

## 2018-03-16 ENCOUNTER — Encounter: Payer: Self-pay | Admitting: Hematology and Oncology

## 2018-03-16 ENCOUNTER — Encounter (HOSPITAL_BASED_OUTPATIENT_CLINIC_OR_DEPARTMENT_OTHER): Payer: Self-pay

## 2018-03-16 ENCOUNTER — Other Ambulatory Visit: Payer: Self-pay | Admitting: Hematology and Oncology

## 2018-03-16 ENCOUNTER — Ambulatory Visit
Admission: RE | Admit: 2018-03-16 | Discharge: 2018-03-16 | Disposition: A | Discharge status: Home / Self Care | Payer: BLUE CROSS/BLUE SHIELD | Source: Ambulatory Visit | Attending: Radiation Oncology | Admitting: Radiation Oncology

## 2018-03-16 ENCOUNTER — Encounter: Payer: Self-pay | Admitting: Emergency Medicine

## 2018-03-16 VITALS — BP 105/69 | HR 93 | Temp 99.0°F | Resp 18

## 2018-03-16 DIAGNOSIS — C530 Malignant neoplasm of endocervix: Secondary | ICD-10-CM

## 2018-03-16 MED ORDER — TBO-FILGRASTIM 300 MCG/0.5ML ~~LOC~~ SOSY
PREFILLED_SYRINGE | SUBCUTANEOUS | Status: AC
  Filled 2018-03-16: qty 0.5

## 2018-03-16 MED ORDER — TBO-FILGRASTIM 300 MCG/0.5ML ~~LOC~~ SOSY
300.0000 ug | PREFILLED_SYRINGE | Freq: Once | SUBCUTANEOUS | Status: AC
  Administered 2018-03-16: 300 ug via SUBCUTANEOUS

## 2018-03-16 NOTE — Assessment & Plan Note (Signed)
She has severe hypomagnesemia due to recent treatment She will continue oral magnesium along with intravenous magnesium as needed

## 2018-03-16 NOTE — Telephone Encounter (Signed)
Spoke with patient about coming in for IVF today or Saturday. States she does not feel like she is taking in enough liquids or food. Will come on Saturday at 0830 for IVF.  Message to scheduler

## 2018-03-16 NOTE — Progress Notes (Signed)
Hi tammi,  Please call and see if she needs IV fluids today or tomorrow. Otherwise just anti-emetics as prescribed

## 2018-03-16 NOTE — Progress Notes (Signed)
[place] OFFICE PROGRESS NOTE  Patient Care Team: Patient, No Pcp Per as PCP - General (General Practice)  ASSESSMENT & PLAN:  Cervical cancer ([place]) She has completed recent chemotherapy She has ongoing radiation treatment Continue supportive care I will see her back in 2 weeks  Pancytopenia, acquired ([place]) She has severe pancytopenia due to treatment I recommend G-CSF support to keep [place] greater than 1.5 The risks, benefits, side effects of treatment is discussed with the patient and she agreed to proceed We discussed neutropenic precaution  Hypomagnesemia She has severe hypomagnesemia due to recent treatment She will continue oral magnesium along with intravenous magnesium as needed   No orders of the defined types were placed in this encounter.   INTERVAL HISTORY: Please see below for problem oriented charting. She is seen in the infusion room while receiving IV magnesium She tolerated last cycle of treatment well Denies significant nausea vomiting She has intermittent low-grade fever but denies chills No cough, diarrhea or localizing signs for infection Denies vaginal bleeding  SUMMARY OF ONCOLOGIC HISTORY:   Cervical cancer ([place])   01/04/2017 Initial Diagnosis    She went to urgent care service for evaluation of abnormal vaginal symptoms with discharge and was diagnosed with bacterial vaginosis      01/03/2018 Imaging    1. Complex thick-walled fluid collection/abscess the lower pelvis with possible communication to the lower uterine/vagina. Clinical correlation and further evaluation with pelvic ultrasound recommended. CT with IV and delayed oral and rectal contrast may provide additional information. A smaller collection is also noted in the left posterior hemipelvis the left of the rectum. 2. No bowel obstruction or active inflammation.  Normal appendix. Please note the described 5.0 x 5.5 cm complex collection within the pelvis appears to be in  the region of the cervix and may represent a necrotic mass/neoplasm. The described 3 x 3 cm low attenuating lesion to the left of the rectum may represent an abnormal lymph node.       01/04/2018 Pathology Results    Cervix, biopsy - POORLY DIFFERENTIATED CARCINOMA - SEE COMMENT Microscopic Comment The biopsy material consists of multiple fragments of tissue with infiltrative tumor and granulation tissue. By immunohistochemistry, the tumor is positive for cytokeratin 5/6, p63, p16 and vimentin (diffusely positive) but negative for ER. 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      01/04/2018 Imaging    Pelvic US 1. Heterogeneous area of irregularity with Doppler flow in the region of the cervix. A cervical mass is not excluded. Clinical correlation is recommended.  2. Rounded complex mass/collection posterior to the lower uterus/vagina corresponding to the larger complex collection seen on the earlier CT. This may represent a necrotic or purulent collection. 3. Grossly unremarkable left ovary. Nonvisualization the right ovary.      01/04/2018 Procedure    She underwent examination under anesthesia Procedure: pt taken to the operating room where gen anesthesia was performed. She was placed in the dorsal lithotomy position and prepped and draped in the usual sterile fashion. A bivalve speculum was placed in the pts vagina and a macerated cervix was noted. There was very little that resembled a normal cervix. The tissue was necrotic and malodorous.  Several biopsies were obtained. There was actually very little bleeding from the biopsy sites although there was bleeding from within the uterine cavity above the location of the cervix.         01/22/2018 PET scan  1. Large hypermetabolic cervical mass with local invasion into the perirectal space. Bilateral pelvic adenopathy noted with small but hypermetabolic pelvic lymph nodes compatible with  malignancy. I do not see definite hypermetabolic adenopathy in the upper abdomen, but there are scattered small bilateral hypermetabolic lymph nodes in the neck and axillary regions of uncertain significance. Given the low level activity and the skipped region in the abdomen, it is possible that the neck and chest lymph nodes are simply reactive, as usually I would expect to see more upper abdominal adenopathy were there involvement in the neck and chest. Surveillance is probably warranted. 2. Bilateral mild diffuse thyroid activity suggesting thyroiditis. 3. Other imaging findings of potential clinical significance: Aortic Atherosclerosis (ICD10-I70.0). Mild cardiomegaly. Bilateral iliac artery atherosclerosis. Suspected uterine fundal fibroid.       01/25/2018 Cancer Staging    Staging form: Cervix Uteri, AJCC 8th Edition - Clinical: FIGO Stage IVA (cT4, cN1, cM0) - Signed by [name], MD on 01/25/2018      02/01/2018 Procedure    Successful 8 French right internal jugular vein power port placement with its tip at the SVC/RA junction.      02/09/2018 -  Chemotherapy    The patient had weekly cisplatin       REVIEW OF SYSTEMS:   Eyes: Denies blurriness of vision Ears, nose, mouth, throat, and face: Denies mucositis or sore throat Respiratory: Denies cough, dyspnea or wheezes Cardiovascular: Denies palpitation, chest discomfort or lower extremity swelling Gastrointestinal:  Denies nausea, heartburn or change in bowel habits Skin: Denies abnormal skin rashes Lymphatics: Denies new lymphadenopathy or easy bruising Neurological:Denies numbness, tingling or new weaknesses Behavioral/Psych: Mood is stable, no new changes  All other systems were reviewed with the patient and are negative.  I have reviewed the past medical history, past surgical history, social history and family history with the patient and they are unchanged from previous note.  ALLERGIES:  has No Known  Allergies.  MEDICATIONS:  Current Outpatient Medications  Medication Sig Dispense Refill  . docusate sodium (COLACE) 100 MG capsule Take 100 mg by mouth daily.    Marland Kitchen levothyroxine (SYNTHROID, LEVOTHROID) 25 MCG tablet Take 1 tablet (25 mcg total) by mouth daily before breakfast. 30 tablet 1  . magnesium oxide (MAG-OX) 400 (241.3 Mg) MG tablet Take 1 tablet (400 mg total) by mouth 2 (two) times daily. 60 tablet 3  . metFORMIN (GLUCOPHAGE) 500 MG tablet Take 250 mg by mouth 2 (two) times daily with a meal.    . Misc Natural Products (ESTROVEN + ENERGY MAX STRENGTH PO) Take 1 tablet by mouth daily.    Marland Kitchen morphine (MSIR) 15 MG tablet Take 1 tablet (15 mg total) by mouth every 4 (four) hours as needed for severe pain. 60 tablet 0  . ondansetron (ZOFRAN) 4 MG tablet Take 1 tablet (4 mg total) by mouth every 8 (eight) hours as needed for nausea or vomiting. 20 tablet 0   No current facility-administered medications for this visit.     PHYSICAL EXAMINATION: ECOG PERFORMANCE STATUS: 1 - Symptomatic but completely ambulatory GENERAL:alert, no distress and comfortable SKIN: skin color, texture, turgor are normal, no rashes or significant lesions EYES: normal, Conjunctiva are pink and non-injected, sclera clear OROPHARYNX:no exudate, no erythema and lips, buccal mucosa, and tongue normal  NECK: supple, thyroid normal size, non-tender, without nodularity LYMPH:  no palpable lymphadenopathy in the cervical, axillary or inguinal LUNGS: clear to auscultation and percussion with normal breathing effort HEART: regular rate & rhythm  and no murmurs and no lower extremity edema ABDOMEN:abdomen soft, non-tender and normal bowel sounds Musculoskeletal:no cyanosis of digits and no clubbing  NEURO: alert & oriented x 3 with fluent speech, no focal motor/sensory deficits  LABORATORY DATA:  I have reviewed the data as listed    Component Value Date/Time   NA 136 03/14/2018 1526   K 4.2 03/14/2018 1526   CL  102 03/14/2018 1526   CO2 27 03/14/2018 1526   GLUCOSE 117 03/14/2018 1526   BUN 14 03/14/2018 1526   CREATININE 1.00 03/14/2018 1526   CREATININE 1.21 (H) 02/19/2018 1616   CALCIUM 8.9 03/14/2018 1526   PROT 7.8 03/14/2018 1526   ALBUMIN 3.6 03/14/2018 1526   AST 16 03/14/2018 1526   ALT 18 03/14/2018 1526   ALKPHOS 76 03/14/2018 1526   BILITOT 0.3 03/14/2018 1526   GFRNONAA >60 03/14/2018 1526   GFRNONAA 52 (L) 02/19/2018 1616   GFRAA >60 03/14/2018 1526   GFRAA 60 02/19/2018 1616    No results found for: SPEP, UPEP  Lab Results  Component Value Date   WBC 1.8 (L) 03/14/2018   NEUTROABS 1.4 (L) 03/14/2018   HGB 9.5 (L) 03/14/2018   HCT 28.1 (L) 03/14/2018   MCV 85.0 03/14/2018   PLT 285 03/14/2018      Chemistry      Component Value Date/Time   NA 136 03/14/2018 1526   K 4.2 03/14/2018 1526   CL 102 03/14/2018 1526   CO2 27 03/14/2018 1526   BUN 14 03/14/2018 1526   CREATININE 1.00 03/14/2018 1526   CREATININE 1.21 (H) 02/19/2018 1616      Component Value Date/Time   CALCIUM 8.9 03/14/2018 1526   ALKPHOS 76 03/14/2018 1526   AST 16 03/14/2018 1526   ALT 18 03/14/2018 1526   BILITOT 0.3 03/14/2018 1526      All questions were answered. The patient knows to call the clinic with any problems, questions or concerns. No barriers to learning was detected.  I spent 15 minutes counseling the patient face to face. The total time spent in the appointment was 20 minutes and more than 50% was on counseling and review of test results  [name], MD 03/16/2018 8:44 AM

## 2018-03-16 NOTE — Telephone Encounter (Signed)
I will order under supportive care

## 2018-03-16 NOTE — Progress Notes (Unsigned)
PT in today for Injection of Granix 381mcg. Pt c/o of N/V this am but other wise tolerating food and fluids. Pt v/s are 105/69 HR 93 ; febrile @ 99.0. Pt c/o constipation as well as sweating. [name], RN made aware. Also Sending this note to [place]. Pt given instructions to  Call us if fever continues to rise or if symptoms worsen. Pt verbalized understanding.

## 2018-03-16 NOTE — Assessment & Plan Note (Signed)
She has completed recent chemotherapy She has ongoing radiation treatment Continue supportive care I will see her back in 2 weeks

## 2018-03-16 NOTE — Assessment & Plan Note (Signed)
She has severe pancytopenia due to treatment I recommend G-CSF support to keep [place] greater than 1.5 The risks, benefits, side effects of treatment is discussed with the patient and she agreed to proceed We discussed neutropenic precaution

## 2018-03-16 NOTE — Progress Notes (Signed)
FMLA paperwork has been successfully faxed to [place] at [phone]. Mailed copy to patient address on file.

## 2018-03-17 ENCOUNTER — Inpatient Hospital Stay: Payer: Self-pay

## 2018-03-17 VITALS — BP 118/70 | HR 75 | Temp 99.4°F | Resp 18 | Ht 61.0 in

## 2018-03-17 DIAGNOSIS — C530 Malignant neoplasm of endocervix: Secondary | ICD-10-CM

## 2018-03-17 MED ORDER — SODIUM CHLORIDE 0.9% FLUSH
10.0000 mL | Freq: Once | INTRAVENOUS | Status: AC
  Administered 2018-03-17: 10 mL
  Filled 2018-03-17: qty 10

## 2018-03-17 MED ORDER — SODIUM CHLORIDE 0.9 % IV SOLN
Freq: Once | INTRAVENOUS | Status: AC
  Administered 2018-03-17: 10:00:00 via INTRAVENOUS

## 2018-03-17 MED ORDER — HEPARIN SOD (PORK) LOCK FLUSH 100 UNIT/ML IV SOLN
500.0000 [IU] | Freq: Once | INTRAVENOUS | Status: AC
  Administered 2018-03-17: 500 [IU]
  Filled 2018-03-17: qty 5

## 2018-03-17 NOTE — Patient Instructions (Signed)
Dehydration, Adult Dehydration is when there is not enough fluid or water in your body. This happens when you lose more fluids than you take in. Dehydration can range from mild to very bad. It should be treated right away to keep it from getting very bad. Symptoms of mild dehydration may include:  Thirst.  Dry lips.  Slightly dry mouth.  Dry, warm skin.  Dizziness. Symptoms of moderate dehydration may include:  Very dry mouth.  Muscle cramps.  Dark pee (urine). Pee may be the color of tea.  Your body making less pee.  Your eyes making fewer tears.  Heartbeat that is uneven or faster than normal (palpitations).  Headache.  Light-headedness, especially when you stand up from sitting.  Fainting (syncope). Symptoms of very bad dehydration may include:  Changes in skin, such as: ? Cold and clammy skin. ? Blotchy (mottled) or pale skin. ? Skin that does not quickly return to normal after being lightly pinched and let go (poor skin turgor).  Changes in body fluids, such as: ? Feeling very thirsty. ? Your eyes making fewer tears. ? Not sweating when body temperature is high, such as in hot weather. ? Your body making very little pee.  Changes in vital signs, such as: ? Weak pulse. ? Pulse that is more than 100 beats a minute when you are sitting still. ? Fast breathing. ? Low blood pressure.  Other changes, such as: ? Sunken eyes. ? Cold hands and feet. ? Confusion. ? Lack of energy (lethargy). ? Trouble waking up from sleep. ? Short-term weight loss. ? Unconsciousness. Follow these instructions at home:  If told by your doctor, drink an ORS: ? Make an ORS by using instructions on the package. ? Start by drinking small amounts, about  cup (120 mL) every 5-10 minutes. ? Slowly drink more until you have had the amount that your doctor said to have.  Drink enough clear fluid to keep your pee clear or pale yellow. If you were told to drink an ORS, finish the ORS  first, then start slowly drinking clear fluids. Drink fluids such as: ? Water. Do not drink only water by itself. Doing that can make the salt (sodium) level in your body get too low (hyponatremia). ? Ice chips. ? Fruit juice that you have added water to (diluted). ? Low-calorie sports drinks.  Avoid: ? Alcohol. ? Drinks that have a lot of sugar. These include high-calorie sports drinks, fruit juice that does not have water added, and soda. ? Caffeine. ? Foods that are greasy or have a lot of fat or sugar.  Take over-the-counter and prescription medicines only as told by your doctor.  Do not take salt tablets. Doing that can make the salt level in your body get too high (hypernatremia).  Eat foods that have minerals (electrolytes). Examples include bananas, oranges, potatoes, tomatoes, and spinach.  Keep all follow-up visits as told by your doctor. This is important. Contact a doctor if:  You have belly (abdominal) pain that: ? Gets worse. ? Stays in one area (localizes).  You have a rash.  You have a stiff neck.  You get angry or annoyed more easily than normal (irritability).  You are more sleepy than normal.  You have a harder time waking up than normal.  You feel: ? Weak. ? Dizzy. ? Very thirsty.  You have peed (urinated) only a small amount of very dark pee during 6-8 hours. Get help right away if:  You have symptoms of   very bad dehydration.  You cannot drink fluids without throwing up (vomiting).  Your symptoms get worse with treatment.  You have a fever.  You have a very bad headache.  You are throwing up or having watery poop (diarrhea) and it: ? Gets worse. ? Does not go away.  You have blood or something green (bile) in your throw-up.  You have blood in your poop (stool). This may cause poop to look black and tarry.  You have not peed in 6-8 hours.  You pass out (faint).  Your heart rate when you are sitting still is more than 100 beats a  minute.  You have trouble breathing. This information is not intended to replace advice given to you by your health care provider. Make sure you discuss any questions you have with your health care provider. Document Released: 08/20/2009 Document Revised: 05/13/2016 Document Reviewed: 12/18/2015 Elsevier Interactive Patient Education  2018 [employer] Inc[employer]  

## 2018-03-19 ENCOUNTER — Inpatient Hospital Stay: Payer: Self-pay

## 2018-03-19 ENCOUNTER — Other Ambulatory Visit: Payer: Self-pay

## 2018-03-19 ENCOUNTER — Encounter (HOSPITAL_BASED_OUTPATIENT_CLINIC_OR_DEPARTMENT_OTHER): Payer: Self-pay

## 2018-03-19 NOTE — H&P (View-Only) (Signed)
Radiation Oncology         [phone] ________________________________  History and physical examination  Name: Misty Thomas MRN: [id number]  Date: 03/01/2018  DOB: October 31, 1966   DIAGNOSIS: Stage IIIB, squamous cell carcinoma of the cervix.   HISTORY OF PRESENT ILLNESS::Misty Thomas is a 52 y.o. female who is  diagnosed earlier this year with advanced cervical cancer. She has recently completed her external beam and radiosensitizing chemotherapy and is now ready to proceed with brachytherapy as part of her definitive management. Patient's vaginal bleeding/drainage has improve significantly with her treatments thus far. Patient has had some mild problems with constipation with her treatment. She does have some fatigue.  PREVIOUS RADIATION THERAPY: No  PAST MEDICAL HISTORY:  has a past medical history of Acquired pancytopenia ([place]) (03/2018), Anemia, Cancer associated pain, Cellulitis, leg, Cervical cancer ([place]), Chemotherapy induced nausea and vomiting, Diabetes mellitus without complication ([place]), Diarrhea, HIV (human immunodeficiency virus infection) ([place]), Hypomagnesemia (03/2018), Hypothyroidism, MRSA infection, Port-A-Cath in place, and Sinusitis.    PAST SURGICAL HISTORY: Past Surgical History:  Procedure Laterality Date  . IR FLUORO GUIDE PORT INSERTION RIGHT  02/01/2018  . TUBAL LIGATION      FAMILY HISTORY: family history includes Cancer in her maternal grandmother.  SOCIAL HISTORY:  reports that she has never smoked. She has never used smokeless tobacco. She reports that she drank alcohol. She reports that she does not use drugs.  ALLERGIES: Patient has no known allergies.  MEDICATIONS:  No current facility-administered medications for this encounter.    Current Outpatient Medications  Medication Sig Dispense Refill  . docusate sodium (COLACE) 100 MG capsule Take 100 mg by mouth daily.    Marland Kitchen levothyroxine (SYNTHROID, LEVOTHROID) 25 MCG tablet Take 1 tablet (25 mcg  total) by mouth daily before breakfast. 30 tablet 1  . magnesium oxide (MAG-OX) 400 (241.3 Mg) MG tablet Take 1 tablet (400 mg total) by mouth 2 (two) times daily. 60 tablet 3  . metFORMIN (GLUCOPHAGE) 500 MG tablet Take 250 mg by mouth 2 (two) times daily with a meal.    . Misc Natural Products (ESTROVEN + ENERGY MAX STRENGTH PO) Take 1 tablet by mouth daily.    Marland Kitchen morphine (MSIR) 15 MG tablet Take 1 tablet (15 mg total) by mouth every 4 (four) hours as needed for severe pain. 60 tablet 0  . ondansetron (ZOFRAN) 4 MG tablet Take 1 tablet (4 mg total) by mouth every 8 (eight) hours as needed for nausea or vomiting. 20 tablet 0    REVIEW OF SYSTEMS:  REVIEW OF SYSTEMS: A 10+ POINT REVIEW OF SYSTEMS WAS OBTAINED including neurology, dermatology, psychiatry, cardiac, respiratory, lymph, extremities, GI, GU, musculoskeletal, constitutional, reproductive, HEENT. All pertinent positives are noted in the HPI. All others are negative.  PHYSICAL EXAM:  height is 5\' 1"  (1.549 m) and weight is 214 lb (97.1 kg).   General: Alert and oriented, in no acute distress HEENT: Head is normocephalic. Extraocular movements are intact. Oropharynx is clear. Neck: Neck is supple, no palpable cervical or supraclavicular lymphadenopathy. Heart: Regular in rate and rhythm with no murmurs, rubs, or gallops. Chest: Clear to auscultation bilaterally, with no rhonchi, wheezes, or rales. Abdomen: Soft, nontender, nondistended, with no rigidity or guarding. Extremities: No cyanosis or edema. Lymphatics: see Neck Exam Skin: No concerning lesions. Musculoskeletal: symmetric strength and muscle tone throughout. Neurologic: Cranial nerves II through XII are grossly intact. No obvious focalities. Speech is fluent. Coordination is intact. Psychiatric: Judgment and insight are intact.  Affect is appropriate. Pelvic examination to be performed in the operating room     ECOG = 1  LABORATORY DATA:  Lab Results  Component Value  Date   WBC 1.8 (L) 03/14/2018   HGB 9.5 (L) 03/14/2018   HCT 28.1 (L) 03/14/2018   MCV 85.0 03/14/2018   PLT 285 03/14/2018   NEUTROABS 1.4 (L) 03/14/2018   Lab Results  Component Value Date   NA 136 03/14/2018   K 4.2 03/14/2018   CL 102 03/14/2018   CO2 27 03/14/2018   GLUCOSE 117 03/14/2018   CREATININE 1.00 03/14/2018   CALCIUM 8.9 03/14/2018      RADIOGRAPHY: PET scan showing advanced cervical cancer with pelvic nodal metastasis. No distant metastasis    IMPRESSION/PLAN: stage IIIB squamous cell carcinoma cervix. The patient is now ready to proceed with her first high-dose-rate brachytherapy procedure. The patient will be taken to the operating room for exam under anesthesia followed by placement of tandem ring with intraoperative ultrasound guidance for high-dose rate radiation therapy. Patient will receive a total of 5 high-dose rate treatments     ------------------------------------------------  Blair Promise, PhD, MD

## 2018-03-19 NOTE — Progress Notes (Signed)
Spoke with:  Santiago Glad NPO:  After Midnight, no gum, candy, or mints   Arrival time: 0530AM Labs/Procedures:  EKG  (CBC/diff CMP, Magnesium 03/14/2018 in epic) AM medications:  Colace, Levothyroxine, Morphine and Ondansetron if needed Pre op orders:  Yes Ride home:  [name] (husband) [phone]

## 2018-03-19 NOTE — H&P (View-Only) (Signed)
Radiation Oncology         (336) [phone] ________________________________  History and physical examination  Name: Misty Thomas MRN: [id number]  Date: 03/01/2018  DOB: 05-06-1966   DIAGNOSIS: Stage IIIB, squamous cell carcinoma of the cervix.   HISTORY OF PRESENT ILLNESS::Misty Thomas is a 52 y.o. female who is  diagnosed earlier this year with advanced cervical cancer. She has recently completed her external beam and radiosensitizing chemotherapy and is now ready to proceed with brachytherapy as part of her definitive management. Patient's vaginal bleeding/drainage has improve significantly with her treatments thus far. Patient has had some mild problems with constipation with her treatment. She does have some fatigue.  PREVIOUS RADIATION THERAPY: No  PAST MEDICAL HISTORY:  has a past medical history of Acquired pancytopenia ([place]) (03/2018), Anemia, Cancer associated pain, Cellulitis, leg, Cervical cancer ([place]), Chemotherapy induced nausea and vomiting, Diabetes mellitus without complication ([place]), Diarrhea, HIV (human immunodeficiency virus infection) ([place]), Hypomagnesemia (03/2018), Hypothyroidism, MRSA infection, Port-A-Cath in place, and Sinusitis.    PAST SURGICAL HISTORY: Past Surgical History:  Procedure Laterality Date  . IR FLUORO GUIDE PORT INSERTION RIGHT  02/01/2018  . TUBAL LIGATION      FAMILY HISTORY: family history includes Cancer in her maternal grandmother.  SOCIAL HISTORY:  reports that she has never smoked. She has never used smokeless tobacco. She reports that she drank alcohol. She reports that she does not use drugs.  ALLERGIES: Patient has no known allergies.  MEDICATIONS:  No current facility-administered medications for this encounter.    Current Outpatient Medications  Medication Sig Dispense Refill  . docusate sodium (COLACE) 100 MG capsule Take 100 mg by mouth daily.    Marland Kitchen levothyroxine (SYNTHROID, LEVOTHROID) 25 MCG tablet Take 1 tablet (25 mcg  total) by mouth daily before breakfast. 30 tablet 1  . magnesium oxide (MAG-OX) 400 (241.3 Mg) MG tablet Take 1 tablet (400 mg total) by mouth 2 (two) times daily. 60 tablet 3  . metFORMIN (GLUCOPHAGE) 500 MG tablet Take 250 mg by mouth 2 (two) times daily with a meal.    . Misc Natural Products (ESTROVEN + ENERGY MAX STRENGTH PO) Take 1 tablet by mouth daily.    Marland Kitchen morphine (MSIR) 15 MG tablet Take 1 tablet (15 mg total) by mouth every 4 (four) hours as needed for severe pain. 60 tablet 0  . ondansetron (ZOFRAN) 4 MG tablet Take 1 tablet (4 mg total) by mouth every 8 (eight) hours as needed for nausea or vomiting. 20 tablet 0    REVIEW OF SYSTEMS:  REVIEW OF SYSTEMS: A 10+ POINT REVIEW OF SYSTEMS WAS OBTAINED including neurology, dermatology, psychiatry, cardiac, respiratory, lymph, extremities, GI, GU, musculoskeletal, constitutional, reproductive, HEENT. All pertinent positives are noted in the HPI. All others are negative.  PHYSICAL EXAM:  height is 5\' 1"  (1.549 m) and weight is 214 lb (97.1 kg).   General: Alert and oriented, in no acute distress HEENT: Head is normocephalic. Extraocular movements are intact. Oropharynx is clear. Neck: Neck is supple, no palpable cervical or supraclavicular lymphadenopathy. Heart: Regular in rate and rhythm with no murmurs, rubs, or gallops. Chest: Clear to auscultation bilaterally, with no rhonchi, wheezes, or rales. Abdomen: Soft, nontender, nondistended, with no rigidity or guarding. Extremities: No cyanosis or edema. Lymphatics: see Neck Exam Skin: No concerning lesions. Musculoskeletal: symmetric strength and muscle tone throughout. Neurologic: Cranial nerves II through XII are grossly intact. No obvious focalities. Speech is fluent. Coordination is intact. Psychiatric: Judgment and insight are intact.  Affect is appropriate. Pelvic examination to be performed in the operating room     ECOG = 1  LABORATORY DATA:  Lab Results  Component Value  Date   WBC 1.8 (L) 03/14/2018   HGB 9.5 (L) 03/14/2018   HCT 28.1 (L) 03/14/2018   MCV 85.0 03/14/2018   PLT 285 03/14/2018   NEUTROABS 1.4 (L) 03/14/2018   Lab Results  Component Value Date   NA 136 03/14/2018   K 4.2 03/14/2018   CL 102 03/14/2018   CO2 27 03/14/2018   GLUCOSE 117 03/14/2018   CREATININE 1.00 03/14/2018   CALCIUM 8.9 03/14/2018      RADIOGRAPHY: PET scan showing advanced cervical cancer with pelvic nodal metastasis. No distant metastasis    IMPRESSION/PLAN: stage IIIB squamous cell carcinoma cervix. The patient is now ready to proceed with her first high-dose-rate brachytherapy procedure. The patient will be taken to the operating room for exam under anesthesia followed by placement of tandem ring with intraoperative ultrasound guidance for high-dose rate radiation therapy. Patient will receive a total of 5 high-dose rate treatments     ------------------------------------------------  Blair Promise, PhD, MD

## 2018-03-19 NOTE — H&P (View-Only) (Signed)
Radiation Oncology         [phone]) [phone] ________________________________  History and physical examination  Name: Misty Thomas MRN: [id number]  Date: 03/01/2018  DOB: 04-Apr-1966   DIAGNOSIS: Stage IIIB, squamous cell carcinoma of the cervix.   HISTORY OF PRESENT ILLNESS::Misty Thomas is a 52 y.o. female who is  diagnosed earlier this year with advanced cervical cancer. She has recently completed her external beam and radiosensitizing chemotherapy and is now ready to proceed with brachytherapy as part of her definitive management. Patient's vaginal bleeding/drainage has improve significantly with her treatments thus far. Patient has had some mild problems with constipation with her treatment. She does have some fatigue.  PREVIOUS RADIATION THERAPY: No  PAST MEDICAL HISTORY:  has a past medical history of Acquired pancytopenia ([place]) (03/2018), Anemia, Cancer associated pain, Cellulitis, leg, Cervical cancer ([place]), Chemotherapy induced nausea and vomiting, Diabetes mellitus without complication ([place]), Diarrhea, HIV (human immunodeficiency virus infection) ([place]), Hypomagnesemia (03/2018), Hypothyroidism, MRSA infection, Port-A-Cath in place, and Sinusitis.    PAST SURGICAL HISTORY: Past Surgical History:  Procedure Laterality Date  . IR FLUORO GUIDE PORT INSERTION RIGHT  02/01/2018  . TUBAL LIGATION      FAMILY HISTORY: family history includes Cancer in her maternal grandmother.  SOCIAL HISTORY:  reports that she has never smoked. She has never used smokeless tobacco. She reports that she drank alcohol. She reports that she does not use drugs.  ALLERGIES: Patient has no known allergies.  MEDICATIONS:  No current facility-administered medications for this encounter.    Current Outpatient Medications  Medication Sig Dispense Refill  . docusate sodium (COLACE) 100 MG capsule Take 100 mg by mouth daily.    Marland Kitchen levothyroxine (SYNTHROID, LEVOTHROID) 25 MCG tablet Take 1 tablet (25 mcg  total) by mouth daily before breakfast. 30 tablet 1  . magnesium oxide (MAG-OX) 400 (241.3 Mg) MG tablet Take 1 tablet (400 mg total) by mouth 2 (two) times daily. 60 tablet 3  . metFORMIN (GLUCOPHAGE) 500 MG tablet Take 250 mg by mouth 2 (two) times daily with a meal.    . Misc Natural Products (ESTROVEN + ENERGY MAX STRENGTH PO) Take 1 tablet by mouth daily.    Marland Kitchen morphine (MSIR) 15 MG tablet Take 1 tablet (15 mg total) by mouth every 4 (four) hours as needed for severe pain. 60 tablet 0  . ondansetron (ZOFRAN) 4 MG tablet Take 1 tablet (4 mg total) by mouth every 8 (eight) hours as needed for nausea or vomiting. 20 tablet 0    REVIEW OF SYSTEMS:  REVIEW OF SYSTEMS: A 10+ POINT REVIEW OF SYSTEMS WAS OBTAINED including neurology, dermatology, psychiatry, cardiac, respiratory, lymph, extremities, GI, GU, musculoskeletal, constitutional, reproductive, HEENT. All pertinent positives are noted in the HPI. All others are negative.  PHYSICAL EXAM:  height is 5\' 1"  (1.549 m) and weight is 214 lb (97.1 kg).   General: Alert and oriented, in no acute distress HEENT: Head is normocephalic. Extraocular movements are intact. Oropharynx is clear. Neck: Neck is supple, no palpable cervical or supraclavicular lymphadenopathy. Heart: Regular in rate and rhythm with no murmurs, rubs, or gallops. Chest: Clear to auscultation bilaterally, with no rhonchi, wheezes, or rales. Abdomen: Soft, nontender, nondistended, with no rigidity or guarding. Extremities: No cyanosis or edema. Lymphatics: see Neck Exam Skin: No concerning lesions. Musculoskeletal: symmetric strength and muscle tone throughout. Neurologic: Cranial nerves II through XII are grossly intact. No obvious focalities. Speech is fluent. Coordination is intact. Psychiatric: Judgment and insight are intact.  Affect is appropriate. Pelvic examination to be performed in the operating room     ECOG = 1  LABORATORY DATA:  Lab Results  Component Value  Date   WBC 1.8 (L) 03/14/2018   HGB 9.5 (L) 03/14/2018   HCT 28.1 (L) 03/14/2018   MCV 85.0 03/14/2018   PLT 285 03/14/2018   NEUTROABS 1.4 (L) 03/14/2018   Lab Results  Component Value Date   NA 136 03/14/2018   K 4.2 03/14/2018   CL 102 03/14/2018   CO2 27 03/14/2018   GLUCOSE 117 03/14/2018   CREATININE 1.00 03/14/2018   CALCIUM 8.9 03/14/2018      RADIOGRAPHY: PET scan showing advanced cervical cancer with pelvic nodal metastasis. No distant metastasis    IMPRESSION/PLAN: stage IIIB squamous cell carcinoma cervix. The patient is now ready to proceed with her first high-dose-rate brachytherapy procedure. The patient will be taken to the operating room for exam under anesthesia followed by placement of tandem ring with intraoperative ultrasound guidance for high-dose rate radiation therapy. Patient will receive a total of 5 high-dose rate treatments     ------------------------------------------------  Blair Promise, PhD, MD

## 2018-03-19 NOTE — H&P (Signed)
Radiation Oncology         (336) [phone] ________________________________  History and physical examination  Name: Misty Thomas MRN: [id number]  Date: 03/01/2018  DOB: 02-11-1966   DIAGNOSIS: Stage IIIB, squamous cell carcinoma of the cervix.   HISTORY OF PRESENT ILLNESS::Misty Thomas is a 52 y.o. female who is  diagnosed earlier this year with advanced cervical cancer. She has recently completed her external beam and radiosensitizing chemotherapy and is now ready to proceed with brachytherapy as part of her definitive management. Patient's vaginal bleeding/drainage has improve significantly with her treatments thus far. Patient has had some mild problems with constipation with her treatment. She does have some fatigue.  PREVIOUS RADIATION THERAPY: No  PAST MEDICAL HISTORY:  has a past medical history of Acquired pancytopenia ([place]) (03/2018), Anemia, Cancer associated pain, Cellulitis, leg, Cervical cancer ([place]), Chemotherapy induced nausea and vomiting, Diabetes mellitus without complication ([place]), Diarrhea, HIV (human immunodeficiency virus infection) ([place]), Hypomagnesemia (03/2018), Hypothyroidism, MRSA infection, Port-A-Cath in place, and Sinusitis.    PAST SURGICAL HISTORY: Past Surgical History:  Procedure Laterality Date  . IR FLUORO GUIDE PORT INSERTION RIGHT  02/01/2018  . TUBAL LIGATION      FAMILY HISTORY: family history includes Cancer in her maternal grandmother.  SOCIAL HISTORY:  reports that she has never smoked. She has never used smokeless tobacco. She reports that she drank alcohol. She reports that she does not use drugs.  ALLERGIES: Patient has no known allergies.  MEDICATIONS:  No current facility-administered medications for this encounter.    Current Outpatient Medications  Medication Sig Dispense Refill  . docusate sodium (COLACE) 100 MG capsule Take 100 mg by mouth daily.    Marland Kitchen levothyroxine (SYNTHROID, LEVOTHROID) 25 MCG tablet Take 1 tablet (25 mcg  total) by mouth daily before breakfast. 30 tablet 1  . magnesium oxide (MAG-OX) 400 (241.3 Mg) MG tablet Take 1 tablet (400 mg total) by mouth 2 (two) times daily. 60 tablet 3  . metFORMIN (GLUCOPHAGE) 500 MG tablet Take 250 mg by mouth 2 (two) times daily with a meal.    . Misc Natural Products (ESTROVEN + ENERGY MAX STRENGTH PO) Take 1 tablet by mouth daily.    Marland Kitchen morphine (MSIR) 15 MG tablet Take 1 tablet (15 mg total) by mouth every 4 (four) hours as needed for severe pain. 60 tablet 0  . ondansetron (ZOFRAN) 4 MG tablet Take 1 tablet (4 mg total) by mouth every 8 (eight) hours as needed for nausea or vomiting. 20 tablet 0    REVIEW OF SYSTEMS:  REVIEW OF SYSTEMS: A 10+ POINT REVIEW OF SYSTEMS WAS OBTAINED including neurology, dermatology, psychiatry, cardiac, respiratory, lymph, extremities, GI, GU, musculoskeletal, constitutional, reproductive, HEENT. All pertinent positives are noted in the HPI. All others are negative.  PHYSICAL EXAM:  height is 5\' 1"  (1.549 m) and weight is 214 lb (97.1 kg).   General: Alert and oriented, in no acute distress HEENT: Head is normocephalic. Extraocular movements are intact. Oropharynx is clear. Neck: Neck is supple, no palpable cervical or supraclavicular lymphadenopathy. Heart: Regular in rate and rhythm with no murmurs, rubs, or gallops. Chest: Clear to auscultation bilaterally, with no rhonchi, wheezes, or rales. Abdomen: Soft, nontender, nondistended, with no rigidity or guarding. Extremities: No cyanosis or edema. Lymphatics: see Neck Exam Skin: No concerning lesions. Musculoskeletal: symmetric strength and muscle tone throughout. Neurologic: Cranial nerves II through XII are grossly intact. No obvious focalities. Speech is fluent. Coordination is intact. Psychiatric: Judgment and insight are intact.  Affect is appropriate. Pelvic examination to be performed in the operating room     ECOG = 1  LABORATORY DATA:  Lab Results  Component Value  Date   WBC 1.8 (L) 03/14/2018   HGB 9.5 (L) 03/14/2018   HCT 28.1 (L) 03/14/2018   MCV 85.0 03/14/2018   PLT 285 03/14/2018   NEUTROABS 1.4 (L) 03/14/2018   Lab Results  Component Value Date   NA 136 03/14/2018   K 4.2 03/14/2018   CL 102 03/14/2018   CO2 27 03/14/2018   GLUCOSE 117 03/14/2018   CREATININE 1.00 03/14/2018   CALCIUM 8.9 03/14/2018      RADIOGRAPHY: PET scan showing advanced cervical cancer with pelvic nodal metastasis. No distant metastasis    IMPRESSION/PLAN: stage IIIB squamous cell carcinoma cervix. The patient is now ready to proceed with her first high-dose-rate brachytherapy procedure. The patient will be taken to the operating room for exam under anesthesia followed by placement of tandem ring with intraoperative ultrasound guidance for high-dose rate radiation therapy. Patient will receive a total of 5 high-dose rate treatments     ------------------------------------------------  Blair Promise, PhD, MD

## 2018-03-19 NOTE — H&P (View-Only) (Signed)
Radiation Oncology         (336) [phone] ________________________________  History and physical examination  Name: Misty Thomas MRN: [id number]  Date: 03/01/2018  DOB: 1966/06/17   DIAGNOSIS: Stage IIIB, squamous cell carcinoma of the cervix.   HISTORY OF PRESENT ILLNESS::Misty Thomas is a 52 y.o. female who is  diagnosed earlier this year with advanced cervical cancer. She has recently completed her external beam and radiosensitizing chemotherapy and is now ready to proceed with brachytherapy as part of her definitive management. Patient's vaginal bleeding/drainage has improve significantly with her treatments thus far. Patient has had some mild problems with constipation with her treatment. She does have some fatigue.  PREVIOUS RADIATION THERAPY: No  PAST MEDICAL HISTORY:  has a past medical history of Acquired pancytopenia ([place]) (03/2018), Anemia, Cancer associated pain, Cellulitis, leg, Cervical cancer ([place]), Chemotherapy induced nausea and vomiting, Diabetes mellitus without complication ([place]), Diarrhea, HIV (human immunodeficiency virus infection) ([place]), Hypomagnesemia (03/2018), Hypothyroidism, MRSA infection, Port-A-Cath in place, and Sinusitis.    PAST SURGICAL HISTORY: Past Surgical History:  Procedure Laterality Date  . IR FLUORO GUIDE PORT INSERTION RIGHT  02/01/2018  . TUBAL LIGATION      FAMILY HISTORY: family history includes Cancer in her maternal grandmother.  SOCIAL HISTORY:  reports that she has never smoked. She has never used smokeless tobacco. She reports that she drank alcohol. She reports that she does not use drugs.  ALLERGIES: Patient has no known allergies.  MEDICATIONS:  No current facility-administered medications for this encounter.    Current Outpatient Medications  Medication Sig Dispense Refill  . docusate sodium (COLACE) 100 MG capsule Take 100 mg by mouth daily.    Marland Kitchen levothyroxine (SYNTHROID, LEVOTHROID) 25 MCG tablet Take 1 tablet (25 mcg  total) by mouth daily before breakfast. 30 tablet 1  . magnesium oxide (MAG-OX) 400 (241.3 Mg) MG tablet Take 1 tablet (400 mg total) by mouth 2 (two) times daily. 60 tablet 3  . metFORMIN (GLUCOPHAGE) 500 MG tablet Take 250 mg by mouth 2 (two) times daily with a meal.    . Misc Natural Products (ESTROVEN + ENERGY MAX STRENGTH PO) Take 1 tablet by mouth daily.    Marland Kitchen morphine (MSIR) 15 MG tablet Take 1 tablet (15 mg total) by mouth every 4 (four) hours as needed for severe pain. 60 tablet 0  . ondansetron (ZOFRAN) 4 MG tablet Take 1 tablet (4 mg total) by mouth every 8 (eight) hours as needed for nausea or vomiting. 20 tablet 0    REVIEW OF SYSTEMS:  REVIEW OF SYSTEMS: A 10+ POINT REVIEW OF SYSTEMS WAS OBTAINED including neurology, dermatology, psychiatry, cardiac, respiratory, lymph, extremities, GI, GU, musculoskeletal, constitutional, reproductive, HEENT. All pertinent positives are noted in the HPI. All others are negative.  PHYSICAL EXAM:  height is 5\' 1"  (1.549 m) and weight is 214 lb (97.1 kg).   General: Alert and oriented, in no acute distress HEENT: Head is normocephalic. Extraocular movements are intact. Oropharynx is clear. Neck: Neck is supple, no palpable cervical or supraclavicular lymphadenopathy. Heart: Regular in rate and rhythm with no murmurs, rubs, or gallops. Chest: Clear to auscultation bilaterally, with no rhonchi, wheezes, or rales. Abdomen: Soft, nontender, nondistended, with no rigidity or guarding. Extremities: No cyanosis or edema. Lymphatics: see Neck Exam Skin: No concerning lesions. Musculoskeletal: symmetric strength and muscle tone throughout. Neurologic: Cranial nerves II through XII are grossly intact. No obvious focalities. Speech is fluent. Coordination is intact. Psychiatric: Judgment and insight are intact.  Affect is appropriate. Pelvic examination to be performed in the operating room     ECOG = 1  LABORATORY DATA:  Lab Results  Component Value  Date   WBC 1.8 (L) 03/14/2018   HGB 9.5 (L) 03/14/2018   HCT 28.1 (L) 03/14/2018   MCV 85.0 03/14/2018   PLT 285 03/14/2018   NEUTROABS 1.4 (L) 03/14/2018   Lab Results  Component Value Date   NA 136 03/14/2018   K 4.2 03/14/2018   CL 102 03/14/2018   CO2 27 03/14/2018   GLUCOSE 117 03/14/2018   CREATININE 1.00 03/14/2018   CALCIUM 8.9 03/14/2018      RADIOGRAPHY: PET scan showing advanced cervical cancer with pelvic nodal metastasis. No distant metastasis    IMPRESSION/PLAN: stage IIIB squamous cell carcinoma cervix. The patient is now ready to proceed with her first high-dose-rate brachytherapy procedure. The patient will be taken to the operating room for exam under anesthesia followed by placement of tandem ring with intraoperative ultrasound guidance for high-dose rate radiation therapy. Patient will receive a total of 5 high-dose rate treatments     ------------------------------------------------  Blair Promise, PhD, MD

## 2018-03-19 NOTE — Anesthesia Preprocedure Evaluation (Addendum)
Anesthesia Evaluation  Patient identified by MRN, date of birth, ID band Patient awake    Reviewed: Allergy & Precautions, NPO status , Patient's Chart, lab work & pertinent test results  History of Anesthesia Complications Negative for: history of anesthetic complications  Airway Mallampati: II  TM Distance: >3 FB Neck ROM: Full    Dental  (+) Teeth Intact, Dental Advisory Given   Pulmonary neg pulmonary ROS,    breath sounds clear to auscultation       Cardiovascular (-) anginanegative cardio ROS   Rhythm:Regular Rate:Normal     Neuro/Psych negative neurological ROS     GI/Hepatic negative GI ROS, Neg liver ROS,   Endo/Other  diabetes, Well Controlled, Type 2, Oral Hypoglycemic AgentsMorbid obesity  Renal/GU negative Renal ROS   Cervical cancer: chemo    Musculoskeletal   Abdominal (+) + obese,   Peds  Hematology  (+) Blood dyscrasia (pancytopenia), anemia , HIV, Hb 9.5 HIV +   Anesthesia Other Findings   Reproductive/Obstetrics                          Anesthesia Physical Anesthesia Plan  ASA: III  Anesthesia Plan: General   Post-op Pain Management:    Induction: Intravenous  PONV Risk Score and Plan: 3 and Dexamethasone and Scopolamine patch - Pre-op  Airway Management Planned: LMA  Additional Equipment:   Intra-op Plan:   Post-operative Plan:   Informed Consent: I have reviewed the patients History and Physical, chart, labs and discussed the procedure including the risks, benefits and alternatives for the proposed anesthesia with the patient or authorized representative who has indicated his/her understanding and acceptance.   Dental advisory given  Plan Discussed with: CRNA and Surgeon  Anesthesia Plan Comments: (Plan routine monitors, GA- LMA OK)       Anesthesia Quick Evaluation

## 2018-03-20 ENCOUNTER — Ambulatory Visit: Payer: BLUE CROSS/BLUE SHIELD

## 2018-03-20 ENCOUNTER — Ambulatory Visit (HOSPITAL_BASED_OUTPATIENT_CLINIC_OR_DEPARTMENT_OTHER): Payer: Self-pay | Admitting: Anesthesiology

## 2018-03-20 ENCOUNTER — Ambulatory Visit: Payer: BLUE CROSS/BLUE SHIELD | Admitting: Internal Medicine

## 2018-03-20 ENCOUNTER — Ambulatory Visit (HOSPITAL_BASED_OUTPATIENT_CLINIC_OR_DEPARTMENT_OTHER)
Admission: RE | Admit: 2018-03-20 | Discharge: 2018-03-20 | Disposition: A | Discharge status: Other Acute Inpatient Hospital | Payer: Self-pay | Source: Ambulatory Visit | Attending: Radiation Oncology | Admitting: Radiation Oncology

## 2018-03-20 ENCOUNTER — Other Ambulatory Visit: Payer: Self-pay

## 2018-03-20 ENCOUNTER — Ambulatory Visit (HOSPITAL_COMMUNITY)
Admission: RE | Admit: 2018-03-20 | Discharge: 2018-03-20 | Disposition: A | Discharge status: Home / Self Care | Payer: BLUE CROSS/BLUE SHIELD | Source: Ambulatory Visit | Attending: Radiation Oncology | Admitting: Radiation Oncology

## 2018-03-20 ENCOUNTER — Encounter: Payer: Self-pay | Admitting: Oncology

## 2018-03-20 ENCOUNTER — Encounter (HOSPITAL_BASED_OUTPATIENT_CLINIC_OR_DEPARTMENT_OTHER)
Admission: RE | Disposition: A | Discharge status: Other Acute Inpatient Hospital | Payer: Self-pay | Source: Ambulatory Visit | Attending: Radiation Oncology

## 2018-03-20 ENCOUNTER — Ambulatory Visit
Admission: RE | Admit: 2018-03-20 | Discharge: 2018-03-20 | Disposition: A | Discharge status: Home / Self Care | Payer: BLUE CROSS/BLUE SHIELD | Source: Ambulatory Visit | Attending: Radiation Oncology | Admitting: Radiation Oncology

## 2018-03-20 ENCOUNTER — Encounter (HOSPITAL_BASED_OUTPATIENT_CLINIC_OR_DEPARTMENT_OTHER): Payer: Self-pay | Admitting: *Deleted

## 2018-03-20 VITALS — BP 141/67 | HR 64 | Resp 18

## 2018-03-20 DIAGNOSIS — Z8614 Personal history of Methicillin resistant Staphylococcus aureus infection: Secondary | ICD-10-CM | POA: Insufficient documentation

## 2018-03-20 DIAGNOSIS — C530 Malignant neoplasm of endocervix: Secondary | ICD-10-CM

## 2018-03-20 DIAGNOSIS — C775 Secondary and unspecified malignant neoplasm of intrapelvic lymph nodes: Secondary | ICD-10-CM | POA: Insufficient documentation

## 2018-03-20 DIAGNOSIS — Z79899 Other long term (current) drug therapy: Secondary | ICD-10-CM | POA: Insufficient documentation

## 2018-03-20 DIAGNOSIS — G893 Neoplasm related pain (acute) (chronic): Secondary | ICD-10-CM | POA: Insufficient documentation

## 2018-03-20 DIAGNOSIS — Z923 Personal history of irradiation: Secondary | ICD-10-CM | POA: Insufficient documentation

## 2018-03-20 DIAGNOSIS — Z9221 Personal history of antineoplastic chemotherapy: Secondary | ICD-10-CM | POA: Insufficient documentation

## 2018-03-20 DIAGNOSIS — C539 Malignant neoplasm of cervix uteri, unspecified: Secondary | ICD-10-CM | POA: Insufficient documentation

## 2018-03-20 DIAGNOSIS — Z6841 Body Mass Index (BMI) 40.0 and over, adult: Secondary | ICD-10-CM | POA: Insufficient documentation

## 2018-03-20 DIAGNOSIS — E119 Type 2 diabetes mellitus without complications: Secondary | ICD-10-CM | POA: Insufficient documentation

## 2018-03-20 DIAGNOSIS — D61818 Other pancytopenia: Secondary | ICD-10-CM | POA: Insufficient documentation

## 2018-03-20 DIAGNOSIS — Z7984 Long term (current) use of oral hypoglycemic drugs: Secondary | ICD-10-CM | POA: Insufficient documentation

## 2018-03-20 DIAGNOSIS — Z21 Asymptomatic human immunodeficiency virus [HIV] infection status: Secondary | ICD-10-CM | POA: Insufficient documentation

## 2018-03-20 DIAGNOSIS — E039 Hypothyroidism, unspecified: Secondary | ICD-10-CM | POA: Insufficient documentation

## 2018-03-20 HISTORY — DX: Hypomagnesemia: E83.42

## 2018-03-20 HISTORY — DX: Adverse effect of antineoplastic and immunosuppressive drugs, initial encounter: T45.1X5A

## 2018-03-20 HISTORY — DX: Anemia, unspecified: D64.9

## 2018-03-20 HISTORY — DX: Human immunodeficiency virus (HIV) disease: B20

## 2018-03-20 HISTORY — PX: TANDEM RING INSERTION: SHX6199

## 2018-03-20 HISTORY — DX: Neoplasm related pain (acute) (chronic): G89.3

## 2018-03-20 HISTORY — DX: Asymptomatic human immunodeficiency virus (hiv) infection status: Z21

## 2018-03-20 HISTORY — DX: Other pancytopenia: D61.818

## 2018-03-20 HISTORY — DX: Presence of other vascular implants and grafts: Z95.828

## 2018-03-20 HISTORY — DX: Adverse effect of antineoplastic and immunosuppressive drugs, initial encounter: R11.2

## 2018-03-20 LAB — CBC WITH DIFFERENTIAL/PLATELET
BASOS PCT: 1 %
Basophils Absolute: 0 10*3/uL (ref 0.0–0.1)
EOS PCT: 1 %
Eosinophils Absolute: 0 10*3/uL (ref 0.0–0.7)
HEMATOCRIT: 26.5 % — AB (ref 36.0–46.0)
Hemoglobin: 8.6 g/dL — ABNORMAL LOW (ref 12.0–15.0)
LYMPHS PCT: 17 %
Lymphs Abs: 0.3 10*3/uL — ABNORMAL LOW (ref 0.7–4.0)
MCH: 28.8 pg (ref 26.0–34.0)
MCHC: 32.5 g/dL (ref 30.0–36.0)
MCV: 88.6 fL (ref 78.0–100.0)
MONOS PCT: 22 %
Monocytes Absolute: 0.4 10*3/uL (ref 0.1–1.0)
NEUTROS ABS: 1.1 10*3/uL — AB (ref 1.7–7.7)
Neutrophils Relative %: 59 %
Platelets: 248 10*3/uL (ref 150–400)
RBC: 2.99 MIL/uL — ABNORMAL LOW (ref 3.87–5.11)
RDW: 16 % — AB (ref 11.5–15.5)
WBC: 1.9 10*3/uL — ABNORMAL LOW (ref 4.0–10.5)

## 2018-03-20 LAB — GLUCOSE, CAPILLARY: Glucose-Capillary: 139 mg/dL — ABNORMAL HIGH (ref 65–99)

## 2018-03-20 LAB — POCT I-STAT 4, (NA,K, GLUC, HGB,HCT)
GLUCOSE: 153 mg/dL — AB (ref 65–99)
HEMATOCRIT: 37 % (ref 36.0–46.0)
HEMOGLOBIN: 12.6 g/dL (ref 12.0–15.0)
POTASSIUM: 4 mmol/L (ref 3.5–5.1)
SODIUM: 139 mmol/L (ref 135–145)

## 2018-03-20 SURGERY — INSERTION, UTERINE TANDEM AND RING OR CYLINDER, FOR BRACHYTHERAPY
Anesthesia: General | Site: Vagina

## 2018-03-20 MED ORDER — SCOPOLAMINE 1 MG/3DAYS TD PT72
MEDICATED_PATCH | TRANSDERMAL | Status: AC
  Filled 2018-03-20: qty 1

## 2018-03-20 MED ORDER — SCOPOLAMINE 1 MG/3DAYS TD PT72
1.0000 | MEDICATED_PATCH | Freq: Once | TRANSDERMAL | Status: AC
  Administered 2018-03-20: 1 via TRANSDERMAL
  Filled 2018-03-20: qty 1

## 2018-03-20 MED ORDER — MIDAZOLAM HCL 2 MG/2ML IJ SOLN
INTRAMUSCULAR | Status: AC
  Filled 2018-03-20: qty 2

## 2018-03-20 MED ORDER — PROPOFOL 500 MG/50ML IV EMUL
INTRAVENOUS | Status: AC
  Filled 2018-03-20: qty 50

## 2018-03-20 MED ORDER — KETOROLAC TROMETHAMINE 30 MG/ML IJ SOLN
INTRAMUSCULAR | Status: DC | PRN
  Administered 2018-03-20: 30 mg via INTRAVENOUS

## 2018-03-20 MED ORDER — MEPERIDINE HCL 25 MG/ML IJ SOLN
6.2500 mg | INTRAMUSCULAR | Status: DC | PRN
  Filled 2018-03-20: qty 1

## 2018-03-20 MED ORDER — ONDANSETRON HCL 4 MG/2ML IJ SOLN
INTRAMUSCULAR | Status: AC
  Filled 2018-03-20: qty 2

## 2018-03-20 MED ORDER — DEXAMETHASONE SODIUM PHOSPHATE 10 MG/ML IJ SOLN
INTRAMUSCULAR | Status: AC
  Filled 2018-03-20: qty 1

## 2018-03-20 MED ORDER — LIDOCAINE 2% (20 MG/ML) 5 ML SYRINGE
INTRAMUSCULAR | Status: DC | PRN
  Administered 2018-03-20: 80 mg via INTRAVENOUS

## 2018-03-20 MED ORDER — MIDAZOLAM HCL 2 MG/2ML IJ SOLN
0.5000 mg | Freq: Once | INTRAMUSCULAR | Status: DC | PRN
  Filled 2018-03-20: qty 2

## 2018-03-20 MED ORDER — SODIUM CHLORIDE 0.9 % IR SOLN
Status: DC | PRN
  Administered 2018-03-20: 200 mL via INTRAVESICAL

## 2018-03-20 MED ORDER — LACTATED RINGERS IV SOLN
INTRAVENOUS | Status: DC
  Administered 2018-03-20: 11:00:00 via INTRAVENOUS
  Filled 2018-03-20 (×2): qty 250

## 2018-03-20 MED ORDER — PROPOFOL 10 MG/ML IV BOLUS
INTRAVENOUS | Status: AC
  Filled 2018-03-20: qty 40

## 2018-03-20 MED ORDER — PHENYLEPHRINE 40 MCG/ML (10ML) SYRINGE FOR IV PUSH (FOR BLOOD PRESSURE SUPPORT)
PREFILLED_SYRINGE | INTRAVENOUS | Status: AC
  Filled 2018-03-20: qty 10

## 2018-03-20 MED ORDER — FENTANYL CITRATE (PF) 100 MCG/2ML IJ SOLN
INTRAMUSCULAR | Status: DC | PRN
  Administered 2018-03-20: 25 ug via INTRAVENOUS
  Administered 2018-03-20 (×2): 50 ug via INTRAVENOUS
  Administered 2018-03-20: 25 ug via INTRAVENOUS
  Administered 2018-03-20: 50 ug via INTRAVENOUS

## 2018-03-20 MED ORDER — SODIUM CHLORIDE 0.9 % IV SOLN
INTRAVENOUS | Status: DC
  Administered 2018-03-20 (×2): via INTRAVENOUS
  Filled 2018-03-20: qty 1000

## 2018-03-20 MED ORDER — ACETAMINOPHEN 10 MG/ML IV SOLN
INTRAVENOUS | Status: DC | PRN
  Administered 2018-03-20: 1000 mg via INTRAVENOUS

## 2018-03-20 MED ORDER — FENTANYL CITRATE (PF) 100 MCG/2ML IJ SOLN
INTRAMUSCULAR | Status: AC
  Filled 2018-03-20: qty 2

## 2018-03-20 MED ORDER — ACETAMINOPHEN 10 MG/ML IV SOLN
INTRAVENOUS | Status: AC
  Filled 2018-03-20: qty 100

## 2018-03-20 MED ORDER — DEXAMETHASONE SODIUM PHOSPHATE 10 MG/ML IJ SOLN
INTRAMUSCULAR | Status: DC | PRN
  Administered 2018-03-20: 10 mg via INTRAVENOUS

## 2018-03-20 MED ORDER — MIDAZOLAM HCL 2 MG/2ML IJ SOLN
INTRAMUSCULAR | Status: DC | PRN
  Administered 2018-03-20: 2 mg via INTRAVENOUS

## 2018-03-20 MED ORDER — HYDROMORPHONE HCL 1 MG/ML IJ SOLN
INTRAMUSCULAR | Status: AC
  Filled 2018-03-20: qty 1

## 2018-03-20 MED ORDER — HYDROMORPHONE HCL 1 MG/ML IJ SOLN
0.5000 mg | Freq: Once | INTRAMUSCULAR | Status: AC
  Administered 2018-03-20: 0.5 mg via INTRAVENOUS
  Filled 2018-03-20: qty 1

## 2018-03-20 MED ORDER — FENTANYL CITRATE (PF) 100 MCG/2ML IJ SOLN
25.0000 ug | INTRAMUSCULAR | Status: DC | PRN
Start: 2018-03-20 — End: 2018-03-20
  Administered 2018-03-20 (×2): 25 ug via INTRAVENOUS
  Administered 2018-03-20: 50 ug via INTRAVENOUS
  Filled 2018-03-20: qty 1

## 2018-03-20 MED ORDER — METHYLENE BLUE 0.5 % INJ SOLN
INTRAVENOUS | Status: AC
  Filled 2018-03-20: qty 10

## 2018-03-20 MED ORDER — PROPOFOL 10 MG/ML IV BOLUS
INTRAVENOUS | Status: DC | PRN
  Administered 2018-03-20: 200 mg via INTRAVENOUS

## 2018-03-20 MED ORDER — LACTATED RINGERS IV SOLN
INTRAVENOUS | Status: DC
  Filled 2018-03-20: qty 1000

## 2018-03-20 MED ORDER — PROMETHAZINE HCL 25 MG/ML IJ SOLN
6.2500 mg | INTRAMUSCULAR | Status: DC | PRN
  Filled 2018-03-20: qty 1

## 2018-03-20 MED ORDER — MAGNESIUM SULFATE 50 % IJ SOLN
Freq: Once | INTRAMUSCULAR | Status: DC
  Filled 2018-03-20: qty 100

## 2018-03-20 MED ORDER — PHENYLEPHRINE HCL 10 MG/ML IJ SOLN
INTRAMUSCULAR | Status: DC | PRN
  Administered 2018-03-20: 80 ug via INTRAVENOUS
  Administered 2018-03-20: 120 ug via INTRAVENOUS

## 2018-03-20 SURGICAL SUPPLY — 30 items
BAG URINE DRAINAGE (UROLOGICAL SUPPLIES) ×2 IMPLANT
BNDG CONFORM 2 STRL LF (GAUZE/BANDAGES/DRESSINGS) IMPLANT
CATH FOLEY 2WAY SLVR  5CC 16FR (CATHETERS) ×1
CATH FOLEY 2WAY SLVR 5CC 16FR (CATHETERS) ×1 IMPLANT
DILATOR CANAL MILEX (MISCELLANEOUS) ×1 IMPLANT
GLOVE BIO SURGEON STRL SZ7.5 (GLOVE) ×4 IMPLANT
GOWN STRL REUS W/ TWL LRG LVL3 (GOWN DISPOSABLE) ×2 IMPLANT
GOWN STRL REUS W/TWL LRG LVL3 (GOWN DISPOSABLE) ×4
HOLDER FOLEY CATH W/STRAP (MISCELLANEOUS) ×2 IMPLANT
HOVERMATT SINGLE USE (MISCELLANEOUS) ×2 IMPLANT
KIT TURNOVER CYSTO (KITS) ×2 IMPLANT
NDL SPNL 22GX3.5 QUINCKE BK (NEEDLE) IMPLANT
NEEDLE SPNL 22GX3.5 QUINCKE BK (NEEDLE) IMPLANT
PACK VAGINAL MINOR WOMEN LF (CUSTOM PROCEDURE TRAY) ×2 IMPLANT
PACKING VAGINAL (PACKING) ×1 IMPLANT
PAD ABD 8X10 STRL (GAUZE/BANDAGES/DRESSINGS) ×2 IMPLANT
PAD OB MATERNITY 4.3X12.25 (PERSONAL CARE ITEMS) IMPLANT
PLUG CATH AND CAP STER (CATHETERS) IMPLANT
SET IRRIG Y TYPE TUR BLADDER L (SET/KITS/TRAYS/PACK) ×2 IMPLANT
SUT PROLENE 0 SH 30 (SUTURE) ×1 IMPLANT
SUT SILK 2 0 30  PSL (SUTURE) ×1
SUT SILK 2 0 30 PSL (SUTURE) IMPLANT
SYR BULB IRRIGATION 50ML (SYRINGE) IMPLANT
SYR CONTROL 10ML LL (SYRINGE) ×1 IMPLANT
SYRINGE 10CC LL (SYRINGE) ×2 IMPLANT
TOWEL OR 17X24 6PK STRL BLUE (TOWEL DISPOSABLE) ×4 IMPLANT
TUBE CONNECTING 12X1/4 (SUCTIONS) IMPLANT
WATER STERILE IRR 3000ML UROMA (IV SOLUTION) ×2 IMPLANT
WATER STERILE IRR 500ML POUR (IV SOLUTION) ×2 IMPLANT
YANKAUER SUCT BULB TIP NO VENT (SUCTIONS) IMPLANT

## 2018-03-20 NOTE — Anesthesia Procedure Notes (Signed)
Procedure Name: LMA Insertion Date/Time: 03/20/2018 7:43 AM Performed by: Wanita Chamberlain, CRNA Pre-anesthesia Checklist: Patient identified, Timeout performed, Emergency Drugs available, Suction available and Patient being monitored Patient Re-evaluated:Patient Re-evaluated prior to induction Preoxygenation: Pre-oxygenation with 100% oxygen Induction Type: IV induction Ventilation: Mask ventilation without difficulty LMA: LMA inserted LMA Size: 4.0 Number of attempts: 1 Placement Confirmation: CO2 detector,  positive ETCO2 and breath sounds checked- equal and bilateral Tube secured with: Tape Dental Injury: Teeth and Oropharynx as per pre-operative assessment

## 2018-03-20 NOTE — Transfer of Care (Signed)
Immediate Anesthesia Transfer of Care Note  Patient: DALEY MOORADIAN  Procedure(s) Performed: TANDEM RING INSERTION (N/A Vagina )  Patient Location: PACU  Anesthesia Type:General  Level of Consciousness: awake, alert , oriented and patient cooperative  Airway & Oxygen Therapy: Patient Spontanous Breathing and Patient connected to nasal cannula oxygen  Post-op Assessment: Report given to RN and Post -op Vital signs reviewed and stable  Post vital signs: Reviewed and stable  Last Vitals:  Vitals Value Taken Time  BP    Temp    Pulse    Resp    SpO2      Last Pain:  Vitals:   03/20/18 0549  TempSrc: Oral      Patients Stated Pain Goal: 5 (69/50/72 2575)  Complications: No apparent anesthesia complications

## 2018-03-20 NOTE — Progress Notes (Signed)
  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  Identity was confirmed.  All relevant records and images related to the planned course of therapy were reviewed.  The patient freely provided informed written consent to proceed with treatment after reviewing the details related to the planned course of therapy. The consent form was witnessed and verified by the simulation staff.  Then, the patient was set-up in a stable reproducible  supine position for radiation therapy.  CT images were obtained.  Surface markings were placed.  The CT images were loaded into the planning software.  Then the target and avoidance structures were contoured.  Treatment planning then occurred.  The radiation prescription was entered and confirmed.   I have requested : Brachytherapy Isodose Plan and Dosimetry Calculations to plan the radiation distribution.    PLAN:  The patient will receive  5.5 Gy in 1 fraction directed at the high risk clinical target volume. Patient will be treated with a tandem ring system using a 60 tandem with 60 mm length and 60 ring. Iridium 192 will be the high-dose-rate source.    ________________________________  Blair Promise, PhD, MD

## 2018-03-20 NOTE — Progress Notes (Signed)
  Radiation Oncology         [phone] ________________________________  Name: Misty Thomas MRN: [id number]  Date: 03/20/2018  DOB: [birth year]  CC: Patient, No Pcp Per  Everitt Amber, MD  HDR BRACHYTHERAPY NOTE  DIAGNOSIS: stage III -B squamous cell carcinoma cervix  NARRATIVE: The patient was brought to the [place] suite. Identity was confirmed. All relevant records and images related to the planned course of therapy were reviewed. The patient freely provided informed written consent to proceed with treatment after reviewing the details related to the planned course of therapy. The consent form was witnessed and verified by the simulation staff. Then, the patient was set-up in a stable reproducible supine position for radiation therapy. The tandem ring system was accessed and fiducial markers were placed within the tandem and ring.   Simple treatment device note: On the operating room the patient had construction of her custom tandem ring system. She will be treated with a 60 tandem/ring system. The patient had placement of a 60 mm tandem. A cervical ring with a small shielding was used for her treatment. A rectal paddle was also part of her custom set up device.  Verification simulation note: An AP and lateral film was obtained through the pelvis area. This was compared to the patient's planning films documenting accurate position of the tandem/ring system for treatment.  High-dose-rate brachytherapy treatment note:  The remote afterloading device was accessed through catheter system and attached to the tandem ring system. Patient then proceeded to undergo her first high-dose-rate treatment directed at the cervix. The patient was prescribed a dose of 5.5 gray to be delivered to the [place].Marland Kitchen Patient was treated with 2 channels using 26 dwell positions. Treatment time was 511.0 seconds. The patient tolerated the procedure well. After completion of her therapy, a radiation survey was performed  documenting return of the iridium source into the GammaMed safe. The patient was then transferred to the nursing suite. She then had removal of the rectal paddle followed by the tandem and ring system. The patient tolerated the removal well.  PLAN: the patient will return next week for her second high-dose-rate treatment. ________________________________  Blair Promise, PhD, MD

## 2018-03-20 NOTE — Progress Notes (Signed)
  Oncology Nurse Navigator Documentation  Navigator Location: [facility] (03/20/18 1158)   )Navigator Encounter Type: Treatment (03/20/18 1158)                     Patient Visit Type: OVPCHE (03/20/18 1158) Treatment Phase: Treatment(first tandem and ring treatment) (03/20/18 1158) Barriers/Navigation Needs: Education (03/20/18 1158) Education: (provided eduction about tandem and ring procedure) (03/20/18 1158) Interventions: Education (03/20/18 1158)            Acuity: Level 2 (03/20/18 1158)   Acuity Level 2: Ongoing guidance and education throughout treatment as needed (03/20/18 1158)     Time Spent with Patient: 30 (03/20/18 1158)   Met with patient during her first tandem and ring procedure.  Discussed what to expect during the procedure and that patient will need to lay flat until her actual treatment.  She verbalized understanding and agreement.

## 2018-03-20 NOTE — Interval H&P Note (Signed)
History and Physical Interval Note:  03/20/2018 7:33 AM  Misty Thomas  has presented today for surgery, with the diagnosis of ENDOCERVIX CANCDR  The various methods of treatment have been discussed with the patient and family. After consideration of risks, benefits and other options for treatment, the patient has consented to  Procedure(s): TANDEM RING INSERTION (N/A) as a surgical intervention .  The patient's history has been reviewed, patient examined, no change in status, stable for surgery.  I have reviewed the patient's chart and labs.  Questions were answered to the patient's satisfaction.     Gery Pray

## 2018-03-20 NOTE — Op Note (Signed)
03/20/2018  9:36 AM  PATIENT:  Misty Thomas  52 y.o. female  PRE-OPERATIVE DIAGNOSIS:  ENDOCERVIX CANCER  POST-OPERATIVE DIAGNOSIS:  ENDOCERVIX CANCER  PROCEDURE:  Procedure(s): TANDEM RING INSERTION (N/A)  SURGEON:  Surgeon(s) and Role:    * Gery Pray, MD - Primary  PHYSICIAN ASSISTANT:   ASSISTANTS: none   ANESTHESIA:   general  EBL:  5 mL   BLOOD ADMINISTERED:none  DRAINS: Urinary Catheter (Foley)   LOCAL MEDICATIONS USED:  NONE  SPECIMEN:  No Specimen  DISPOSITION OF SPECIMEN:  N/A  COUNTS:  YES  TOURNIQUET:  * No tourniquets in log *  DICTATION: patient transported to the OR 4, outpatient facility. She was prepped and draped in the usual sterile fashion and placed in the dorsal lithotomy position. A timeout for the procedure, estimated time of the procedure, preoperative  Allergies and antibiotics was reviewed. The patient had placement of a Foley catheter without difficulty. The bladder was then back filled with approximately 200 mL of sterile water. Patient proceeded to undergo intraoperative ultrasound through transabdominal approach. Excellent images were obtained with the new ultrasound equipment despite the patient's body habitus.On exam under anesthesia the patient was noted to have significant shrinkage of her large cervical mass. The patient continues to have necrotic tumor at the cervical os. There is no obvious parametrial extension at this time although exam somewhat difficult given the patient's weight issues.  On bimanual and rectovaginal examination the patient was noted to have a prominent left perirectal lymph node which was estimated to be approximately 2 x 3 cm in size. This was noted on initial imaging studies. This node was covered by her external beam radiation fields.There was no rectal mucosal involvement by this large perirectal node. On intraoperative ultrasound images the patient was noted to have a large uterine fibroid which is located  primarily along the right side of the uterus and anterior. This did make uterine sounding and dilation very difficult. Several attempts were made to dilate and sound the uterus but this was not successful. I was able to place of a 60 60 mm tandem with ultrasound guidance. This did show good placement  within the endometrial canal.Placement of a cervical sleeve was not possible. The patient then had a 60 ring attached to the tandem followed by a rectal paddle. Patient tolerated the procedure well area and she was subsequently transported to the recovery room in stable condition. Later in the day the patient will be transferred to radiation oncology for planning and her first high-dose-rate treatment. She will receive 4 additional high-dose rate treatments after today which will complete her definitive course of therapy.  PLAN OF CARE: transferred to radiation oncology for planning and treatment  PATIENT DISPOSITION:  PACU - hemodynamically stable.   Delay start of Pharmacological VTE agent (>24hrs) due to surgical blood loss or risk of bleeding: not applicable

## 2018-03-20 NOTE — Discharge Instructions (Signed)

## 2018-03-20 NOTE — Addendum Note (Signed)
Encounter addended by: Gery Pray, MD on: 03/20/2018 5:13 PM  Actions taken: Sign clinical note

## 2018-03-20 NOTE — Anesthesia Postprocedure Evaluation (Signed)
Anesthesia Post Note  Patient: Misty Thomas  Procedure(s) Performed: TANDEM RING INSERTION (N/A Vagina )     Patient location during evaluation: PACU Anesthesia Type: General Level of consciousness: awake and alert, oriented and patient cooperative Pain management: pain level controlled Vital Signs Assessment: post-procedure vital signs reviewed and stable Respiratory status: spontaneous breathing, nonlabored ventilation and respiratory function stable Cardiovascular status: blood pressure returned to baseline and stable Postop Assessment: no apparent nausea or vomiting Anesthetic complications: no    Last Vitals:  Vitals:   03/20/18 0549 03/20/18 0909  BP: 133/75 126/77  Pulse: 96 77  Resp: 18 (!) 9  Temp: 37.3 C 37.2 C  SpO2: 99% 100%    Last Pain:  Vitals:   03/20/18 0909  TempSrc:   PainSc: 5                  Ramiyah Mcclenahan,E. Kesleigh Morson

## 2018-03-21 ENCOUNTER — Encounter (HOSPITAL_BASED_OUTPATIENT_CLINIC_OR_DEPARTMENT_OTHER): Payer: Self-pay | Admitting: Radiation Oncology

## 2018-03-21 ENCOUNTER — Ambulatory Visit: Payer: BLUE CROSS/BLUE SHIELD

## 2018-03-22 ENCOUNTER — Encounter (HOSPITAL_BASED_OUTPATIENT_CLINIC_OR_DEPARTMENT_OTHER): Payer: Self-pay | Admitting: *Deleted

## 2018-03-22 ENCOUNTER — Other Ambulatory Visit: Payer: Self-pay

## 2018-03-22 NOTE — Progress Notes (Addendum)
SPOKE W/ PT VIA PHONE FOR PRE-OP INTERVIEW.  NPO AFTER MN.  ARRIVE AT 0530.  GETTING CBCdiff AND MAGNESIUM LEVEL Friday 03-23-2018 @ 1000.  CURRENT CMET IN Epic AND CHART.  WILL TAKE COLACE, SYNTHROID, AND MAGNESIUM AM DOS W/ SIPS OF WATER AND IF NEEDED MAY TAKE EITHER MSIR OR PERCOCET, PT VERBALIZED UNDERSTANDING NOT BOTH AT THE SAME TIME.  AND MAY TAKE ZOFRAN IF NEEDED. Current EKG done 03-20-2018 in epic, not confirmed yet.  ADDENDUM:  CBCdiff and Magnesium results dated 03-23-2018 sent to dr Sharlene Motts epic.

## 2018-03-23 ENCOUNTER — Encounter (HOSPITAL_COMMUNITY)
Admission: RE | Admit: 2018-03-23 | Discharge: 2018-03-23 | Disposition: A | Discharge status: Home / Self Care | Payer: Self-pay | Source: Ambulatory Visit | Attending: Radiation Oncology | Admitting: Radiation Oncology

## 2018-03-23 DIAGNOSIS — Z01812 Encounter for preprocedural laboratory examination: Secondary | ICD-10-CM | POA: Insufficient documentation

## 2018-03-23 DIAGNOSIS — C539 Malignant neoplasm of cervix uteri, unspecified: Secondary | ICD-10-CM | POA: Insufficient documentation

## 2018-03-23 LAB — CBC WITH DIFFERENTIAL/PLATELET
Basophils Absolute: 0 10*3/uL (ref 0.0–0.1)
Basophils Relative: 0 %
EOS ABS: 0 10*3/uL (ref 0.0–0.7)
EOS PCT: 1 %
HCT: 27.1 % — ABNORMAL LOW (ref 36.0–46.0)
Hemoglobin: 8.9 g/dL — ABNORMAL LOW (ref 12.0–15.0)
LYMPHS ABS: 0.7 10*3/uL (ref 0.7–4.0)
LYMPHS PCT: 22 %
MCH: 28.8 pg (ref 26.0–34.0)
MCHC: 32.8 g/dL (ref 30.0–36.0)
MCV: 87.7 fL (ref 78.0–100.0)
MONO ABS: 0.2 10*3/uL (ref 0.1–1.0)
Monocytes Relative: 6 %
Neutro Abs: 2.4 10*3/uL (ref 1.7–7.7)
Neutrophils Relative %: 71 %
PLATELETS: 298 10*3/uL (ref 150–400)
RBC: 3.09 MIL/uL — ABNORMAL LOW (ref 3.87–5.11)
RDW: 16.3 % — ABNORMAL HIGH (ref 11.5–15.5)
WBC: 3.3 10*3/uL — ABNORMAL LOW (ref 4.0–10.5)

## 2018-03-23 LAB — MAGNESIUM: MAGNESIUM: 1.2 mg/dL — AB (ref 1.7–2.4)

## 2018-03-25 NOTE — Anesthesia Preprocedure Evaluation (Addendum)
Anesthesia Evaluation  Patient identified by MRN, date of birth, ID band Patient awake    Reviewed: Allergy & Precautions, NPO status , Patient's Chart, lab work & pertinent test results  History of Anesthesia Complications Negative for: history of anesthetic complications  Airway Mallampati: II  TM Distance: >3 FB Neck ROM: Full    Dental  (+) Teeth Intact, Dental Advisory Given   Pulmonary neg pulmonary ROS,    Pulmonary exam normal        Cardiovascular (-) anginanegative cardio ROS Normal cardiovascular exam     Neuro/Psych negative neurological ROS     GI/Hepatic negative GI ROS, Neg liver ROS,   Endo/Other  diabetes, Well Controlled, Type 2, Oral Hypoglycemic AgentsHypothyroidism Morbid obesity  Renal/GU negative Renal ROS   Cervical cancer: chemo    Musculoskeletal   Abdominal (+) + obese,   Peds  Hematology  (+) Blood dyscrasia (pancytopenia), anemia , HIV, 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 +   Anesthesia Other Findings   Reproductive/Obstetrics                            Anesthesia Physical  Anesthesia Plan  ASA: III  Anesthesia Plan: General   Post-op Pain Management:    Induction: Intravenous  PONV Risk Score and Plan: 3 and Dexamethasone, Scopolamine patch - Pre-op and Ondansetron  Airway Management Planned: LMA  Additional Equipment:   Intra-op Plan:   Post-operative Plan: Extubation in OR  Informed Consent: I have reviewed the patients History and Physical, chart, labs and discussed the procedure including the risks, benefits and alternatives for the proposed anesthesia with the patient or authorized representative who has indicated his/her understanding and acceptance.   Dental advisory given  Plan Discussed with: Anesthesiologist  Anesthesia Plan Comments:        Anesthesia Quick Evaluation

## 2018-03-26 ENCOUNTER — Encounter: Payer: Self-pay | Admitting: Radiation Oncology

## 2018-03-26 ENCOUNTER — Ambulatory Visit
Admission: RE | Admit: 2018-03-26 | Discharge: 2018-03-26 | Disposition: A | Discharge status: Home / Self Care | Payer: BLUE CROSS/BLUE SHIELD | Source: Ambulatory Visit | Attending: Radiation Oncology | Admitting: Radiation Oncology

## 2018-03-26 ENCOUNTER — Encounter (HOSPITAL_BASED_OUTPATIENT_CLINIC_OR_DEPARTMENT_OTHER): Payer: Self-pay | Admitting: *Deleted

## 2018-03-26 ENCOUNTER — Telehealth: Payer: Self-pay | Admitting: Oncology

## 2018-03-26 ENCOUNTER — Ambulatory Visit (HOSPITAL_BASED_OUTPATIENT_CLINIC_OR_DEPARTMENT_OTHER)
Admission: RE | Admit: 2018-03-26 | Discharge: 2018-03-26 | Disposition: A | Discharge status: Home / Self Care | Payer: Self-pay | Source: Ambulatory Visit | Attending: Radiation Oncology | Admitting: Radiation Oncology

## 2018-03-26 ENCOUNTER — Other Ambulatory Visit: Payer: Self-pay

## 2018-03-26 ENCOUNTER — Ambulatory Visit (HOSPITAL_BASED_OUTPATIENT_CLINIC_OR_DEPARTMENT_OTHER): Payer: Self-pay | Admitting: Anesthesiology

## 2018-03-26 ENCOUNTER — Inpatient Hospital Stay: Payer: Self-pay

## 2018-03-26 ENCOUNTER — Inpatient Hospital Stay: Payer: Self-pay | Admitting: Hematology and Oncology

## 2018-03-26 ENCOUNTER — Encounter (HOSPITAL_BASED_OUTPATIENT_CLINIC_OR_DEPARTMENT_OTHER)
Admission: RE | Disposition: A | Discharge status: Home / Self Care | Payer: Self-pay | Source: Ambulatory Visit | Attending: Radiation Oncology

## 2018-03-26 ENCOUNTER — Ambulatory Visit (HOSPITAL_COMMUNITY)
Admission: RE | Admit: 2018-03-26 | Discharge: 2018-03-26 | Disposition: A | Discharge status: Home / Self Care | Payer: BLUE CROSS/BLUE SHIELD | Source: Ambulatory Visit | Attending: Radiation Oncology | Admitting: Radiation Oncology

## 2018-03-26 VITALS — BP 130/76 | HR 76

## 2018-03-26 DIAGNOSIS — E119 Type 2 diabetes mellitus without complications: Secondary | ICD-10-CM | POA: Insufficient documentation

## 2018-03-26 DIAGNOSIS — C530 Malignant neoplasm of endocervix: Secondary | ICD-10-CM

## 2018-03-26 DIAGNOSIS — Z7984 Long term (current) use of oral hypoglycemic drugs: Secondary | ICD-10-CM | POA: Insufficient documentation

## 2018-03-26 DIAGNOSIS — Z21 Asymptomatic human immunodeficiency virus [HIV] infection status: Secondary | ICD-10-CM

## 2018-03-26 DIAGNOSIS — C539 Malignant neoplasm of cervix uteri, unspecified: Secondary | ICD-10-CM | POA: Insufficient documentation

## 2018-03-26 DIAGNOSIS — Z6841 Body Mass Index (BMI) 40.0 and over, adult: Secondary | ICD-10-CM | POA: Insufficient documentation

## 2018-03-26 DIAGNOSIS — E039 Hypothyroidism, unspecified: Secondary | ICD-10-CM | POA: Insufficient documentation

## 2018-03-26 DIAGNOSIS — Z79899 Other long term (current) drug therapy: Secondary | ICD-10-CM | POA: Insufficient documentation

## 2018-03-26 HISTORY — DX: Personal history of diseases of the skin and subcutaneous tissue: Z87.2

## 2018-03-26 HISTORY — DX: Burn of unspecified body region, unspecified degree: T30.0

## 2018-03-26 HISTORY — DX: Nocturia: R35.1

## 2018-03-26 HISTORY — DX: Hypothyroidism, unspecified: E03.9

## 2018-03-26 HISTORY — DX: Other specified hypothyroidism: E03.8

## 2018-03-26 HISTORY — DX: Thrombocytopenia, unspecified: D69.6

## 2018-03-26 HISTORY — DX: Personal history of Methicillin resistant Staphylococcus aureus infection: Z86.14

## 2018-03-26 HISTORY — DX: Personal history of irradiation: Z92.3

## 2018-03-26 HISTORY — DX: Presence of spectacles and contact lenses: Z97.3

## 2018-03-26 HISTORY — DX: Frequency of micturition: R35.0

## 2018-03-26 HISTORY — DX: Type 2 diabetes mellitus without complications: E11.9

## 2018-03-26 HISTORY — PX: TANDEM RING INSERTION: SHX6199

## 2018-03-26 HISTORY — DX: Diarrhea, unspecified: R19.7

## 2018-03-26 LAB — POCT I-STAT 4, (NA,K, GLUC, HGB,HCT)
Glucose, Bld: 145 mg/dL — ABNORMAL HIGH (ref 65–99)
HCT: 40 % (ref 36.0–46.0)
HEMOGLOBIN: 13.6 g/dL (ref 12.0–15.0)
Potassium: 3.6 mmol/L (ref 3.5–5.1)
Sodium: 142 mmol/L (ref 135–145)

## 2018-03-26 SURGERY — INSERTION, UTERINE TANDEM AND RING OR CYLINDER, FOR BRACHYTHERAPY
Anesthesia: General | Site: Vagina

## 2018-03-26 MED ORDER — FENTANYL CITRATE (PF) 100 MCG/2ML IJ SOLN
INTRAMUSCULAR | Status: DC | PRN
  Administered 2018-03-26: 25 ug via INTRAVENOUS
  Administered 2018-03-26: 50 ug via INTRAVENOUS
  Administered 2018-03-26 (×2): 25 ug via INTRAVENOUS
  Administered 2018-03-26: 75 ug via INTRAVENOUS

## 2018-03-26 MED ORDER — LACTATED RINGERS IV SOLN
INTRAVENOUS | Status: DC
  Administered 2018-03-26: 06:00:00 via INTRAVENOUS
  Administered 2018-03-26: 1000 mL via INTRAVENOUS
  Filled 2018-03-26 (×2): qty 1000

## 2018-03-26 MED ORDER — MIDAZOLAM HCL 2 MG/2ML IJ SOLN
INTRAMUSCULAR | Status: DC | PRN
  Administered 2018-03-26: 2 mg via INTRAVENOUS

## 2018-03-26 MED ORDER — PROPOFOL 10 MG/ML IV BOLUS
INTRAVENOUS | Status: DC | PRN
  Administered 2018-03-26: 180 mg via INTRAVENOUS

## 2018-03-26 MED ORDER — ONDANSETRON HCL 4 MG/2ML IJ SOLN
INTRAMUSCULAR | Status: AC
  Filled 2018-03-26: qty 2

## 2018-03-26 MED ORDER — FENTANYL CITRATE (PF) 100 MCG/2ML IJ SOLN
25.0000 ug | INTRAMUSCULAR | Status: DC | PRN
  Administered 2018-03-26: 25 ug via INTRAVENOUS
  Administered 2018-03-26: 50 ug via INTRAVENOUS
  Administered 2018-03-26: 25 ug via INTRAVENOUS
  Administered 2018-03-26: 50 ug via INTRAVENOUS
  Filled 2018-03-26: qty 1

## 2018-03-26 MED ORDER — FENTANYL CITRATE (PF) 100 MCG/2ML IJ SOLN
INTRAMUSCULAR | Status: AC
  Filled 2018-03-26: qty 2

## 2018-03-26 MED ORDER — LIDOCAINE 2% (20 MG/ML) 5 ML SYRINGE
INTRAMUSCULAR | Status: AC
  Filled 2018-03-26: qty 5

## 2018-03-26 MED ORDER — MIDAZOLAM HCL 2 MG/2ML IJ SOLN
INTRAMUSCULAR | Status: AC
  Filled 2018-03-26: qty 2

## 2018-03-26 MED ORDER — DEXAMETHASONE SODIUM PHOSPHATE 10 MG/ML IJ SOLN
INTRAMUSCULAR | Status: AC
  Filled 2018-03-26: qty 1

## 2018-03-26 MED ORDER — HYDROMORPHONE HCL 1 MG/ML IJ SOLN
0.5000 mg | Freq: Once | INTRAMUSCULAR | Status: DC
  Filled 2018-03-26: qty 1

## 2018-03-26 MED ORDER — PROPOFOL 10 MG/ML IV BOLUS
INTRAVENOUS | Status: AC
Start: 2018-03-26 — End: ?
  Filled 2018-03-26: qty 20

## 2018-03-26 MED ORDER — KETOROLAC TROMETHAMINE 30 MG/ML IJ SOLN
INTRAMUSCULAR | Status: AC
  Filled 2018-03-26: qty 1

## 2018-03-26 MED ORDER — TBO-FILGRASTIM 300 MCG/0.5ML ~~LOC~~ SOSY
PREFILLED_SYRINGE | SUBCUTANEOUS | Status: AC
  Filled 2018-03-26: qty 0.5

## 2018-03-26 MED ORDER — HYDROMORPHONE HCL 1 MG/ML IJ SOLN
INTRAMUSCULAR | Status: AC
  Filled 2018-03-26: qty 1

## 2018-03-26 MED ORDER — ONDANSETRON HCL 4 MG/2ML IJ SOLN
INTRAMUSCULAR | Status: DC | PRN
  Administered 2018-03-26: 4 mg via INTRAVENOUS

## 2018-03-26 MED ORDER — DEXAMETHASONE SODIUM PHOSPHATE 10 MG/ML IJ SOLN
INTRAMUSCULAR | Status: DC | PRN
  Administered 2018-03-26: 10 mg via INTRAVENOUS

## 2018-03-26 MED ORDER — SCOPOLAMINE 1 MG/3DAYS TD PT72
MEDICATED_PATCH | TRANSDERMAL | Status: AC
  Filled 2018-03-26: qty 1

## 2018-03-26 MED ORDER — PROMETHAZINE HCL 25 MG/ML IJ SOLN
6.2500 mg | INTRAMUSCULAR | Status: DC | PRN
  Filled 2018-03-26: qty 1

## 2018-03-26 MED ORDER — MORPHINE SULFATE 15 MG PO TABS: 15.0000 mg | ORAL_TABLET | ORAL | 0 refills | Status: DC | PRN

## 2018-03-26 MED ORDER — OXYCODONE-ACETAMINOPHEN 5-325 MG PO TABS: 1.0000 | ORAL_TABLET | ORAL | 0 refills | Status: DC | PRN

## 2018-03-26 MED ORDER — WHITE PETROLATUM EX OINT
TOPICAL_OINTMENT | CUTANEOUS | Status: AC
  Filled 2018-03-26: qty 5

## 2018-03-26 MED ORDER — SODIUM CHLORIDE 0.9 % IV SOLN
INTRAVENOUS | Status: DC
  Administered 2018-03-26: 150 mL/h via INTRAVENOUS
  Filled 2018-03-26 (×2): qty 1000

## 2018-03-26 MED ORDER — SCOPOLAMINE 1 MG/3DAYS TD PT72
1.0000 | MEDICATED_PATCH | TRANSDERMAL | Status: DC
  Administered 2018-03-26: 1.5 mg via TRANSDERMAL
  Filled 2018-03-26: qty 1

## 2018-03-26 MED ORDER — KETOROLAC TROMETHAMINE 30 MG/ML IJ SOLN
INTRAMUSCULAR | Status: DC | PRN
  Administered 2018-03-26: 30 mg via INTRAVENOUS

## 2018-03-26 MED ORDER — LIDOCAINE 2% (20 MG/ML) 5 ML SYRINGE
INTRAMUSCULAR | Status: DC | PRN
  Administered 2018-03-26: 60 mg via INTRAVENOUS

## 2018-03-26 MED ORDER — SODIUM CHLORIDE 0.9 % IR SOLN
Status: DC | PRN
  Administered 2018-03-26: 1000 mL

## 2018-03-26 MED ORDER — HYDROMORPHONE HCL 1 MG/ML IJ SOLN
0.2500 mg | INTRAMUSCULAR | Status: DC | PRN
  Administered 2018-03-26: 0.25 mg via INTRAVENOUS
  Filled 2018-03-26: qty 0.5

## 2018-03-26 MED FILL — OXYCODONE-ACETAMINOPHEN 5-3: 5-325 | 5 days supply | Qty: 30 | Fill #0

## 2018-03-26 MED FILL — MORPHINE SULFATE IR 15 MG T: 15 | 10 days supply | Qty: 60 | Fill #0

## 2018-03-26 SURGICAL SUPPLY — 38 items
BAG URINE DRAINAGE (UROLOGICAL SUPPLIES) ×3 IMPLANT
BNDG CONFORM 2 STRL LF (GAUZE/BANDAGES/DRESSINGS) IMPLANT
CATH FOLEY 2WAY SLVR  5CC 16FR (CATHETERS) ×2
CATH FOLEY 2WAY SLVR 5CC 16FR (CATHETERS) ×1 IMPLANT
GLOVE BIO SURGEON STRL SZ 6.5 (GLOVE) ×1 IMPLANT
GLOVE BIO SURGEON STRL SZ7.5 (GLOVE) ×6 IMPLANT
GLOVE BIO SURGEONS STRL SZ 6.5 (GLOVE) ×1
GLOVE BIOGEL PI IND STRL 6.5 (GLOVE) IMPLANT
GLOVE BIOGEL PI IND STRL 7.5 (GLOVE) IMPLANT
GLOVE BIOGEL PI INDICATOR 6.5 (GLOVE) ×2
GLOVE BIOGEL PI INDICATOR 7.5 (GLOVE) ×2
GOWN STRL REUS W/ TWL LRG LVL3 (GOWN DISPOSABLE) ×2 IMPLANT
GOWN STRL REUS W/TWL LRG LVL3 (GOWN DISPOSABLE) ×6
HOLDER FOLEY CATH W/STRAP (MISCELLANEOUS) ×3 IMPLANT
HOVERMATT SINGLE USE (MISCELLANEOUS) ×3 IMPLANT
IV NS 1000ML (IV SOLUTION) ×3
IV NS 1000ML BAXH (IV SOLUTION) IMPLANT
KIT TURNOVER CYSTO (KITS) ×3 IMPLANT
NDL SPNL 22GX3.5 QUINCKE BK (NEEDLE) IMPLANT
NEEDLE SPNL 22GX3.5 QUINCKE BK (NEEDLE) IMPLANT
PACK VAGINAL MINOR WOMEN LF (CUSTOM PROCEDURE TRAY) ×3 IMPLANT
PACKING VAGINAL (PACKING) IMPLANT
PAD ABD 8X10 STRL (GAUZE/BANDAGES/DRESSINGS) ×3 IMPLANT
PAD OB MATERNITY 4.3X12.25 (PERSONAL CARE ITEMS) IMPLANT
PLUG CATH AND CAP STER (CATHETERS) IMPLANT
SET IRRIG Y TYPE TUR BLADDER L (SET/KITS/TRAYS/PACK) ×3 IMPLANT
SUT PROLENE 0 SH 30 (SUTURE) IMPLANT
SUT SILK 2 0 30  PSL (SUTURE)
SUT SILK 2 0 30 PSL (SUTURE) IMPLANT
SYR BULB IRRIGATION 50ML (SYRINGE) IMPLANT
SYR CONTROL 10ML LL (SYRINGE) IMPLANT
SYRINGE 10CC LL (SYRINGE) ×3 IMPLANT
TOWEL OR 17X24 6PK STRL BLUE (TOWEL DISPOSABLE) ×6 IMPLANT
TUBE CONNECTING 12'X1/4 (SUCTIONS)
TUBE CONNECTING 12X1/4 (SUCTIONS) IMPLANT
WATER STERILE IRR 3000ML UROMA (IV SOLUTION) ×1 IMPLANT
WATER STERILE IRR 500ML POUR (IV SOLUTION) ×3 IMPLANT
YANKAUER SUCT BULB TIP NO VENT (SUCTIONS) IMPLANT

## 2018-03-26 NOTE — Anesthesia Postprocedure Evaluation (Signed)
Anesthesia Post Note  Patient: Misty Thomas  Procedure(s) Performed: TANDEM RING INSERTION (N/A Vagina )     Patient location during evaluation: PACU Anesthesia Type: General Level of consciousness: sedated Pain management: pain level controlled Vital Signs Assessment: post-procedure vital signs reviewed and stable Respiratory status: spontaneous breathing and respiratory function stable Cardiovascular status: stable Postop Assessment: no apparent nausea or vomiting Anesthetic complications: no    Last Vitals:  Vitals:   03/26/18 0915 03/26/18 0930  BP: (!) 142/84   Pulse: 63 65  Resp: 10 (!) 9  Temp:    SpO2: (!) 87% 100%    Last Pain:  Vitals:   03/26/18 0914  TempSrc:   PainSc: 3                  Kahner Yanik DANIEL

## 2018-03-26 NOTE — Progress Notes (Signed)
  Radiation Oncology         [phone] ________________________________  Name: Misty Thomas MRN: [id number]  Date: 03/26/2018  DOB: [birth year]  SIMULATION AND TREATMENT PLANNING NOTE HDR BRACHYTHERAPY  DIAGNOSIS:  Malignant neoplasm of endocervix ([place]). Stage IIIB, squamous cell carcinoma of the cervix  NARRATIVE:  The patient was brought to the [place].  Identity was confirmed.  All relevant records and images related to the planned course of therapy were reviewed.  The patient freely provided informed written consent to proceed with treatment after reviewing the details related to the planned course of therapy. The consent form was witnessed and verified by the simulation staff.  Then, the patient was set-up in a stable reproducible  supine position for radiation therapy.  CT images were obtained.  Surface markings were placed.  The CT images were loaded into the planning software.  Then the target and avoidance structures were contoured.  Treatment planning then occurred.  The radiation prescription was entered and confirmed.   I have requested : Brachytherapy Isodose Plan and Dosimetry Calculations to plan the radiation distribution.    PLAN:  The patient will receive 5.5 Gy in 1 fraction directed at the high-risk clinical target volume.  Iridium 192 will be the high-dose-rate source. The tandem ring system will be used to deliver the treatment.    ________________________________  Blair Promise, PhD, MD   This document serves as a record of services personally performed by Gery Pray, MD. It was created on his behalf by [facility], a trained medical scribe. The creation of this record is based on the scribe's personal observations and the provider's statements to them. This document has been checked and approved by the attending provider.

## 2018-03-26 NOTE — Interval H&P Note (Signed)
History and Physical Interval Note:  03/26/2018 7:34 AM  Misty Thomas  has presented today for surgery, with the diagnosis of ENDOCERVIX CANCER  The various methods of treatment have been discussed with the patient and family. After consideration of risks, benefits and other options for treatment, the patient has consented to  Procedure(s): TANDEM RING INSERTION (N/A) as a surgical intervention .  The patient's history has been reviewed, patient examined, no change in status, stable for surgery.  I have reviewed the patient's chart and labs.  Questions were answered to the patient's satisfaction.     Gery Pray

## 2018-03-26 NOTE — Progress Notes (Signed)
  Radiation Oncology         [phone] ________________________________  Name: Misty Thomas MRN: [id number]  Date: 03/26/2018  DOB: 1966/05/19  CC: Patient, No Pcp Per  Everitt Amber, MD  HDR BRACHYTHERAPY NOTE  DIAGNOSIS: Malignant neoplasm of endocervix ([place]).Stage IIIB, squamous cell carcinoma of the cervix  NARRATIVE: The patient was brought to the [facility] suite. Identity was confirmed. All relevant records and images related to the planned course of therapy were reviewed. The patient freely provided informed written consent to proceed with treatment after reviewing the details related to the planned course of therapy. The consent form was witnessed and verified by the simulation staff. Then, the patient was set-up in a stable reproducible supine position for radiation therapy. The tandem ring system was accessed and fiducial markers were placed within the tandem and ring.   Simple treatment device note: On the operating room the patient had construction of her custom tandem ring system. She will be treated with a 45 tandem/ring system. The patient had placement of a 60 mm tandem. A cervical ring with a small shielding cap was used for her treatment. A rectal paddle was also part of her custom set up device.  Verification simulation note: An AP and lateral film was obtained through the pelvis area. This was compared to the patient's planning films documenting accurate position of the tandem/ring system for treatment.  High-dose-rate brachytherapy treatment note:  The remote afterloading device was accessed through catheter system and attached to the tandem ring system. Patient then proceeded to undergo her 2nd high-dose-rate treatment directed at the cervix. The patient was prescribed a dose of 5.5 gray to be delivered to the high-risk clinical target volume.. Patient was treated with 2 channels using 26 dwell positions. Treatment time was 547.7 seconds. The patient tolerated the procedure  well. After completion of her therapy, a radiation survey was performed documenting return of the iridium source into the GammaMed safe. The patient was then transferred to the nursing suite. She then had removal of the rectal paddle followed by the tandem and ring system. The patient tolerated the removal well.  PLAN: She will follow up in 1 week for her third treatment.  ________________________________  Blair Promise, PhD, MD  This document serves as a record of services personally performed by Gery Pray, MD. It was created on his behalf by [facility], a trained medical scribe. The creation of this record is based on the scribe's personal observations and the provider's statements to them. This document has been checked and approved by the attending provider.

## 2018-03-26 NOTE — Telephone Encounter (Signed)
Called patient and advised her that her appointments have been rescheduled for 03/27/18: 9:00 lab, 9:30 apt with Dr. Alvy Bimler and 9:45 injection.  She verbalized understanding and agreement.

## 2018-03-26 NOTE — Transfer of Care (Signed)
Immediate Anesthesia Transfer of Care Note  Patient: Misty Thomas  Procedure(s) Performed: Procedure(s) (LRB): TANDEM RING INSERTION (N/A)  Patient Location: PACU  Anesthesia Type: General  Level of Consciousness: awake, oriented, sedated and patient cooperative  Airway & Oxygen Therapy: Patient Spontanous Breathing and Patient connected to face mask oxygen  Post-op Assessment: Report given to PACU RN and Post -op Vital signs reviewed and stable  Post vital signs: Reviewed and stable  Complications: No apparent anesthesia complications  Last Vitals:  Vitals Value Taken Time  BP 157/98 03/26/2018  8:22 AM  Temp 37 C 03/26/2018  8:22 AM  Pulse 79 03/26/2018  8:24 AM  Resp 13 03/26/2018  8:24 AM  SpO2 100 % 03/26/2018  8:24 AM  Vitals shown include unvalidated device data.  Last Pain:  Vitals:   03/26/18 0609  TempSrc:   PainSc: 0-No pain      Patients Stated Pain Goal: 4 (03/26/18 6004)

## 2018-03-26 NOTE — Patient Instructions (Signed)
IMMEDIATELY FOLLOWING SURGERY: Do not drive or operate machinery for the first twenty four hours after surgery. Do not make any important decisions for twenty four hours after surgery or while taking narcotic pain medications or sedatives. If you develop intractable nausea and vomiting or a severe headache please notify your doctor immediately.   FOLLOW-UP: You do not need to follow up with anesthesia unless specifically instructed to do so.   WOUND CARE INSTRUCTIONS (if applicable): Expect some mild vaginal bleeding, but if large amount of bleeding occurs please contact Dr. Sondra Come at [phone] or the Radiation On-Call physician. Call for any fever greater than 101.0 degrees or increasing vaginal//abdominal pain or trouble urinating.   QUESTIONS?: Please feel free to call your physician or the hospital operator if you have any questions, and they will be happy to assist you. Resume all medications: as listed on your after visit summary. Your next appointment is:  Future Appointments  Date Time Provider [place]  04/03/2018  9:00 AM [facility] LAB 4 [facility] None  04/03/2018  9:30 AM [name], MD [facility] None  04/03/2018  9:45 AM [facility] FLUSH NURSE [facility] None  03/28/2018  2:00 PM [facility] PHARMACIST [facility]  03/28/2018  3:00 PM Comer, Okey Regal, MD [facility]  04/04/2018  7:30 AM [facility] PORT 1 [facility] [place]  04/04/2018 10:00 AM Gery Pray, MD [facility] None  04/04/2018  1:00 PM [facility] HDR SUITE [facility] None  04/12/2018  7:30 AM [facility] PORT 1 [facility] [place]  04/12/2018 10:00 AM Gery Pray, MD [facility] None  04/12/2018  1:00 PM [facility] HDR SUITE [facility] None  04/18/2018  8:30 AM [facility] PORT 1 [facility]   04/18/2018 11:00 AM Gery Pray, MD [facility] None  04/18/2018  2:00 PM [facility] HDR SUITE [facility] None

## 2018-03-26 NOTE — Brief Op Note (Signed)
03/26/2018  9:24 AM  PATIENT:  Misty Thomas  52 y.o. female  PRE-OPERATIVE DIAGNOSIS:  ENDOCERVIX CANCER  POST-OPERATIVE DIAGNOSIS:  ENDOCERVIX CANCER  PROCEDURE:  Procedure(s): TANDEM RING INSERTION (N/A)  SURGEON:  Surgeon(s) and Role:    * Gery Pray, MD - Primary  PHYSICIAN ASSISTANT:   ASSISTANTS: none   ANESTHESIA:   general  EBL:  0 mL   BLOOD ADMINISTERED:none  DRAINS: Urinary Catheter (Foley)   LOCAL MEDICATIONS USED:  NONE  SPECIMEN:  No Specimen  DISPOSITION OF SPECIMEN:  N/A  COUNTS:  YES  TOURNIQUET:  * No tourniquets in log *  DICTATION: patient transported to the OR 1, outpatient facility. She was prepped and draped in the usual sterile fashion and placed in the dorsal lithotomy position. A timeout for the procedure, estimated time of the procedure, preoperative  Allergies and antibiotics was reviewed. The patient had placement of a Foley catheter without difficulty. The bladder was then back filled with approximately 200 mL of sterile water. Patient proceeded to undergo intraoperative ultrasound through transabdominal approach. Excellent images were obtained with the new ultrasound equipment despite the patient's body habitus.On exam under anesthesia the patient was noted to have significant shrinkage of her large cervical mass. The patient continues to have necrotic tumor at the cervical os. There is no obvious parametrial extension at this time although exam somewhat difficult given the patient's weight issues.  On bimanual and rectovaginal examination the patient was noted to have a prominent left perirectal lymph node which was estimated to be approximately 2 x 3 cm in size. This was noted on initial imaging studies. This node was covered by her external beam radiation fields.There was no rectal mucosal involvement by this large perirectal node. On intraoperative ultrasound images the patient was noted to have a large uterine fibroid which is located  primarily along the right side of the uterus and anterior.  Cervical dilation and sounding was much easier this week compared to last week, suggesting some shrinkage of the endocervical component of the lesion. The uterus sounded to approximately 8.5 cm. Patient proceeded to undergo serial dilation of the cervical os. A 60 mm cervical sleeve was placed in the endocervical and endometrial canal canal. Excellent reimaging images were obtained.  . I was able to place of a 45 60 mm tandem with ultrasound guidance. This did show good placement  within the endometrial canal.The patient then had a 45 degree ring with a small shielding cap attached to the tandem followed by a rectal paddle. Patient tolerated the procedure well area and she was subsequently transported to the recovery room in stable condition. Later in the day the patient will be transferred to radiation oncology for planning and her first high-dose-rate treatment. She will receive 3 additional high-dose rate treatments after today which will complete her definitive course of therapy.    PLAN OF CARE: transferred to radiation oncology for planning and treatment  PATIENT DISPOSITION:  PACU - hemodynamically stable.   Delay start of Pharmacological VTE agent (>24hrs) due to surgical blood loss or risk of bleeding: not applicable

## 2018-03-26 NOTE — Anesthesia Procedure Notes (Signed)
Procedure Name: LMA Insertion Date/Time: 03/26/2018 7:41 AM Performed by: Suan Halter, CRNA Pre-anesthesia Checklist: Patient identified, Emergency Drugs available, Suction available and Patient being monitored Patient Re-evaluated:Patient Re-evaluated prior to induction Oxygen Delivery Method: Circle system utilized Preoxygenation: Pre-oxygenation with 100% oxygen Induction Type: IV induction Ventilation: Mask ventilation without difficulty LMA: LMA inserted LMA Size: 4.0 Number of attempts: 1 Airway Equipment and Method: Bite block Placement Confirmation: positive ETCO2 Tube secured with: Tape Dental Injury: Teeth and Oropharynx as per pre-operative assessment

## 2018-03-27 ENCOUNTER — Telehealth: Payer: Self-pay | Admitting: Hematology and Oncology

## 2018-03-27 ENCOUNTER — Inpatient Hospital Stay: Payer: Self-pay

## 2018-03-27 ENCOUNTER — Inpatient Hospital Stay (HOSPITAL_BASED_OUTPATIENT_CLINIC_OR_DEPARTMENT_OTHER): Payer: Self-pay | Admitting: Hematology and Oncology

## 2018-03-27 ENCOUNTER — Encounter (HOSPITAL_BASED_OUTPATIENT_CLINIC_OR_DEPARTMENT_OTHER): Payer: Self-pay | Admitting: Radiation Oncology

## 2018-03-27 VITALS — BP 124/77 | HR 82 | Temp 98.3°F | Resp 18 | Ht 61.0 in | Wt 220.2 lb

## 2018-03-27 DIAGNOSIS — C530 Malignant neoplasm of endocervix: Secondary | ICD-10-CM

## 2018-03-27 DIAGNOSIS — D61818 Other pancytopenia: Secondary | ICD-10-CM

## 2018-03-27 DIAGNOSIS — Z21 Asymptomatic human immunodeficiency virus [HIV] infection status: Secondary | ICD-10-CM

## 2018-03-27 DIAGNOSIS — G893 Neoplasm related pain (acute) (chronic): Secondary | ICD-10-CM

## 2018-03-27 LAB — CBC WITH DIFFERENTIAL/PLATELET
Basophils Absolute: 0 10*3/uL (ref 0.0–0.1)
Basophils Relative: 0 %
EOS ABS: 0 10*3/uL (ref 0.0–0.5)
Eosinophils Relative: 0 %
HEMATOCRIT: 24.5 % — AB (ref 34.8–46.6)
Hemoglobin: 8.2 g/dL — ABNORMAL LOW (ref 11.6–15.9)
LYMPHS ABS: 1 10*3/uL (ref 0.9–3.3)
LYMPHS PCT: 28 %
MCH: 29.5 pg (ref 25.1–34.0)
MCHC: 33.5 g/dL (ref 31.5–36.0)
MCV: 87.9 fL (ref 79.5–101.0)
MONOS PCT: 11 %
Monocytes Absolute: 0.4 10*3/uL (ref 0.1–0.9)
NEUTROS ABS: 2.2 10*3/uL (ref 1.5–6.5)
Neutrophils Relative %: 61 %
Platelets: 324 10*3/uL (ref 145–400)
RBC: 2.79 MIL/uL — AB (ref 3.70–5.45)
RDW: 18.1 % — ABNORMAL HIGH (ref 11.2–14.5)
WBC: 3.7 10*3/uL — AB (ref 3.9–10.3)

## 2018-03-27 LAB — COMPREHENSIVE METABOLIC PANEL
ALT: 16 U/L (ref 0–55)
AST: 17 U/L (ref 5–34)
Albumin: 3.4 g/dL — ABNORMAL LOW (ref 3.5–5.0)
Alkaline Phosphatase: 78 U/L (ref 40–150)
Anion gap: 7 (ref 3–11)
BUN: 18 mg/dL (ref 7–26)
CHLORIDE: 102 mmol/L (ref 98–109)
CO2: 27 mmol/L (ref 22–29)
Calcium: 8.9 mg/dL (ref 8.4–10.4)
Creatinine, Ser: 0.98 mg/dL (ref 0.60–1.10)
GFR calc Af Amer: >60 mL/min (ref >60)
Glucose, Bld: 159 mg/dL — ABNORMAL HIGH (ref 70–140)
Potassium: 4.2 mmol/L (ref 3.5–5.1)
Sodium: 136 mmol/L (ref 136–145)
Total Bilirubin: <0.2 mg/dL — ABNORMAL LOW (ref 0.2–1.2)
Total Protein: 7.6 g/dL (ref 6.4–8.3)

## 2018-03-27 LAB — MAGNESIUM: MAGNESIUM: 1.7 mg/dL (ref 1.7–2.4)

## 2018-03-27 MED ORDER — TBO-FILGRASTIM 300 MCG/0.5ML ~~LOC~~ SOSY
PREFILLED_SYRINGE | SUBCUTANEOUS | Status: AC
  Filled 2018-03-27: qty 0.5

## 2018-03-27 MED ORDER — TBO-FILGRASTIM 300 MCG/0.5ML ~~LOC~~ SOSY
300.0000 ug | PREFILLED_SYRINGE | Freq: Once | SUBCUTANEOUS | Status: AC
  Administered 2018-03-27: 300 ug via SUBCUTANEOUS

## 2018-03-27 NOTE — Telephone Encounter (Signed)
Gave avs and calendar ° °

## 2018-03-27 NOTE — Assessment & Plan Note (Signed)
This is due to recent treatment She is mildly symptomatic from anemia I plan to give her 1 unit of blood if  repeat hemoglobin is less than 8

## 2018-03-27 NOTE — Assessment & Plan Note (Signed)
Her cancer pain is stable on morphine She will continue the same

## 2018-03-27 NOTE — Progress Notes (Signed)
[facility] OFFICE PROGRESS NOTE  Patient Care Team: Patient, No Pcp Per as PCP - General (General Practice)  ASSESSMENT & PLAN:  Cervical cancer ([place]) She continues to have mild pain, vaginal bleeding and fatigue since completion of chemotherapy She is still undergoing radiation treatment Due to progressive anemia, I recommend recheck blood count in 3 days time and transfusion as needed I will see her back in 2 weeks for further assessment  Pancytopenia, acquired ([place]) This is due to recent treatment She is mildly symptomatic from anemia I plan to give her 1 unit of blood if  repeat hemoglobin is less than 8  Cancer associated pain Her cancer pain is stable on morphine She will continue the same  HIV (human immunodeficiency virus infection) ([place]) She has asymptomatic HIV infection Infectious disease consultation is pending   Orders Placed This Encounter  Procedures  . Sample to Blood Bank    Standing Status:   Standing    Number of Occurrences:   33    Standing Expiration Date:   2019/04/03    INTERVAL HISTORY: Please see below for problem oriented charting. She returns for further follow-up She complained of fatigue Denies recent infection, fever or chills Nausea is stable She is currently taking morphine sulfate for pain She has minimum vaginal bleeding  SUMMARY OF ONCOLOGIC HISTORY:   Cervical cancer ([place])   01/04/2017 Initial Diagnosis    She went to urgent care service for evaluation of abnormal vaginal symptoms with discharge and was diagnosed with bacterial vaginosis      01/03/2018 Imaging    1. Complex thick-walled fluid collection/abscess the lower pelvis with possible communication to the lower uterine/vagina. Clinical correlation and further evaluation with pelvic ultrasound recommended. CT with IV and delayed oral and rectal contrast may provide additional information. A smaller collection is also noted in the left posterior hemipelvis the  left of the rectum. 2. No bowel obstruction or active inflammation.  Normal appendix. Please note the described 5.0 x 5.5 cm complex collection within the pelvis appears to be in the region of the cervix and may represent a necrotic mass/neoplasm. The described 3 x 3 cm low attenuating lesion to the left of the rectum may represent an abnormal lymph node.       01/04/2018 Pathology Results    Cervix, biopsy - POORLY DIFFERENTIATED CARCINOMA - SEE COMMENT Microscopic Comment The biopsy material consists of multiple fragments of tissue with infiltrative tumor and granulation tissue. By immunohistochemistry, the tumor is positive for cytokeratin 5/6, p63, p16 and vimentin (diffusely positive) but negative for ER. 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      01/04/2018 Imaging    Pelvic US 1. Heterogeneous area of irregularity with Doppler flow in the region of the cervix. A cervical mass is not excluded. Clinical correlation is recommended.  2. Rounded complex mass/collection posterior to the lower uterus/vagina corresponding to the larger complex collection seen on the earlier CT. This may represent a necrotic or purulent collection. 3. Grossly unremarkable left ovary. Nonvisualization the right ovary.      01/04/2018 Procedure    She underwent examination under anesthesia Procedure: pt taken to the operating room where gen anesthesia was performed. She was placed in the dorsal lithotomy position and prepped and draped in the usual sterile fashion. A bivalve speculum was placed in the pts vagina and a macerated cervix was noted. There was very little that resembled a normal cervix. The tissue  was necrotic and malodorous.  Several biopsies were obtained. There was actually very little bleeding from the biopsy sites although there was bleeding from within the uterine cavity above the location of the cervix.         01/22/2018 PET scan    1.  Large hypermetabolic cervical mass with local invasion into the perirectal space. Bilateral pelvic adenopathy noted with small but hypermetabolic pelvic lymph nodes compatible with malignancy. I do not see definite hypermetabolic adenopathy in the upper abdomen, but there are scattered small bilateral hypermetabolic lymph nodes in the neck and axillary regions of uncertain significance. Given the low level activity and the skipped region in the abdomen, it is possible that the neck and chest lymph nodes are simply reactive, as usually I would expect to see more upper abdominal adenopathy were there involvement in the neck and chest. Surveillance is probably warranted. 2. Bilateral mild diffuse thyroid activity suggesting thyroiditis. 3. Other imaging findings of potential clinical significance: Aortic Atherosclerosis (ICD10-I70.0). Mild cardiomegaly. Bilateral iliac artery atherosclerosis. Suspected uterine fundal fibroid.       01/25/2018 Cancer Staging    Staging form: Cervix Uteri, AJCC 8th Edition - Clinical: FIGO Stage IVA (cT4, cN1, cM0) - Signed by [name], MD on 01/25/2018      02/01/2018 Procedure    Successful 8 French right internal jugular vein power port placement with its tip at the SVC/RA junction.      02/09/2018 -  Chemotherapy    The patient had weekly cisplatin       REVIEW OF SYSTEMS:   Constitutional: Denies fevers, chills or abnormal weight loss Eyes: Denies blurriness of vision Ears, nose, mouth, throat, and face: Denies mucositis or sore throat Respiratory: Denies cough, dyspnea or wheezes Cardiovascular: Denies palpitation, chest discomfort or lower extremity swelling Gastrointestinal:  Denies nausea, heartburn or change in bowel habits Skin: Denies abnormal skin rashes Lymphatics: Denies new lymphadenopathy or easy bruising Neurological:Denies numbness, tingling or new weaknesses Behavioral/Psych: Mood is stable, no new changes  All other systems were  reviewed with the patient and are negative.  I have reviewed the past medical history, past surgical history, social history and family history with the patient and they are unchanged from previous note.  ALLERGIES:  has No Known Allergies.  MEDICATIONS:  No current facility-administered medications for this visit.    Current Outpatient Medications  Medication Sig Dispense Refill  . morphine (MSIR) 15 MG tablet Take 1 tablet (15 mg total) by mouth every 4 (four) hours as needed for severe pain. 60 tablet 0  . oxyCODONE-acetaminophen (PERCOCET/ROXICET) 5-325 MG tablet Take 1 tablet by mouth every 4 (four) hours as needed for severe pain. 30 tablet 0   Facility-Administered Medications Ordered in Other Visits  Medication Dose Route Frequency Provider Last Rate Last Dose  . fentaNYL (SUBLIMAZE) injection 25-50 mcg  25-50 mcg Intravenous Q5 min PRN Duane Boston, MD   25 mcg at 03/26/18 [phone]  . HYDROmorphone (DILAUDID) injection 0.25 mg  0.25 mg Intravenous Q5 Min x 2 PRN Duane Boston, MD   0.25 mg at 03/26/18 0908  . lactated ringers infusion   Intravenous Continuous Lynda Rainwater, MD 50 mL/hr at 03/26/18 0857 1,000 mL at 03/26/18 0857  . promethazine (PHENERGAN) injection 6.25-12.5 mg  6.25-12.5 mg Intravenous Q15 min PRN Duane Boston, MD      . scopolamine (TRANSDERM-SCOP) 1 MG/3DAYS 1.5 mg  1 patch Transdermal Q72H Duane Boston, MD   1.5 mg at 03/26/18 0559  PHYSICAL EXAMINATION: ECOG PERFORMANCE STATUS: 1 - Symptomatic but completely ambulatory  Vitals:   03/27/18 0917  BP: 124/77  Pulse: 82  Resp: 18  Temp: 98.3 F (36.8 C)  SpO2: 100%   Filed Weights   03/27/18 0917  Weight: 220 lb 3.2 oz (99.9 kg)    GENERAL:alert, no distress and comfortable SKIN: skin color, texture, turgor are normal, no rashes or significant lesions EYES: normal, Conjunctiva are pink and non-injected, sclera clear OROPHARYNX:no exudate, no erythema and lips, buccal mucosa, and tongue  normal  NECK: supple, thyroid normal size, non-tender, without nodularity LYMPH:  no palpable lymphadenopathy in the cervical, axillary or inguinal LUNGS: clear to auscultation and percussion with normal breathing effort HEART: regular rate & rhythm and no murmurs and no lower extremity edema ABDOMEN:abdomen soft, non-tender and normal bowel sounds Musculoskeletal:no cyanosis of digits and no clubbing  NEURO: alert & oriented x 3 with fluent speech, no focal motor/sensory deficits  LABORATORY DATA:  I have reviewed the data as listed    Component Value Date/Time   NA 136 Oct 04, 202019 0902   K 4.2 Oct 04, 202019 0902   CL 102 Oct 04, 202019 0902   CO2 27 Oct 04, 202019 0902   GLUCOSE 159 (H) Oct 04, 202019 0902   BUN 18 Oct 04, 202019 0902   CREATININE 0.98 Oct 04, 202019 0902   CREATININE 1.21 (H) 02/19/2018 1616   CALCIUM 8.9 Oct 04, 202019 0902   PROT 7.6 Oct 04, 202019 0902   ALBUMIN 3.4 (L) Oct 04, 202019 0902   AST 17 Oct 04, 202019 0902   ALT 16 Oct 04, 202019 0902   ALKPHOS 78 Oct 04, 202019 0902   BILITOT <0.2 (L) Oct 04, 202019 0902   GFRNONAA >60 Oct 04, 202019 0902   GFRNONAA 52 (L) 02/19/2018 1616   GFRAA >60 Oct 04, 202019 0902   GFRAA 60 02/19/2018 1616    No results found for: SPEP, UPEP  Lab Results  Component Value Date   WBC 3.7 (L) Oct 04, 202019   NEUTROABS 2.2 Oct 04, 202019   HGB 8.2 (L) Oct 04, 202019   HCT 24.5 (L) Oct 04, 202019   MCV 87.9 Oct 04, 202019   PLT 324 Oct 04, 202019      Chemistry      Component Value Date/Time   NA 136 Oct 04, 202019 0902   K 4.2 Oct 04, 202019 0902   CL 102 Oct 04, 202019 0902   CO2 27 Oct 04, 202019 0902   BUN 18 Oct 04, 202019 0902   CREATININE 0.98 Oct 04, 202019 0902   CREATININE 1.21 (H) 02/19/2018 1616      Component Value Date/Time   CALCIUM 8.9 Oct 04, 202019 0902   ALKPHOS 78 Oct 04, 202019 0902   AST 17 Oct 04, 202019 0902   ALT 16 Oct 04, 202019 0902   BILITOT <0.2 (L) Oct 04, 202019 0902       RADIOGRAPHIC STUDIES: I have personally reviewed the radiological images as listed and agreed with the findings in  the report. [name] Intraoperative  Result Date: 03/26/2018 CLINICAL DATA:  Ultrasound was provided for use by the ordering physician, and a technical charge was applied by the performing facility.  No radiologist interpretation/professional services rendered.   [name] Intraoperative  Result Date: 03/20/2018 CLINICAL DATA:  Ultrasound was provided for use by the ordering physician, and a technical charge was applied by the performing facility.  No radiologist interpretation/professional services rendered.    All questions were answered. The patient knows to call the clinic with any problems, questions or concerns. No barriers to learning was detected.  I spent 15 minutes counseling the patient face to face. The total time spent in the appointment was 20 minutes and more than 50% was on counseling and review of test results  [name], MD 01-26-202019 11:41 AM

## 2018-03-27 NOTE — Assessment & Plan Note (Signed)
She continues to have mild pain, vaginal bleeding and fatigue since completion of chemotherapy She is still undergoing radiation treatment Due to progressive anemia, I recommend recheck blood count in 3 days time and transfusion as needed I will see her back in 2 weeks for further assessment

## 2018-03-27 NOTE — Assessment & Plan Note (Signed)
She has asymptomatic HIV infection Infectious disease consultation is pending

## 2018-03-28 ENCOUNTER — Ambulatory Visit: Payer: BLUE CROSS/BLUE SHIELD

## 2018-03-28 ENCOUNTER — Ambulatory Visit (INDEPENDENT_AMBULATORY_CARE_PROVIDER_SITE_OTHER): Payer: Self-pay | Admitting: Internal Medicine

## 2018-03-28 ENCOUNTER — Ambulatory Visit: Payer: Self-pay | Admitting: Pharmacist Clinician (PhC)/ Clinical Pharmacy Specialist

## 2018-03-28 ENCOUNTER — Encounter: Payer: Self-pay | Admitting: Internal Medicine

## 2018-03-28 VITALS — BP 124/82 | HR 106 | Temp 98.6°F | Wt 217.0 lb

## 2018-03-28 DIAGNOSIS — Z21 Asymptomatic human immunodeficiency virus [HIV] infection status: Secondary | ICD-10-CM

## 2018-03-28 MED ORDER — SULFAMETHOXAZOLE-TRIMETHOPRIM 800-160 MG PO TABS: 1.0000 | ORAL_TABLET | Freq: Every day | ORAL | 2 refills | Status: DC

## 2018-03-28 MED ORDER — BICTEGRAVIR-EMTRICITAB-TENOFOV 50-200-25 MG PO TABS: 1.0000 | ORAL_TABLET | Freq: Every day | ORAL | 11 refills | Status: DC

## 2018-03-28 MED ORDER — BICTEGRAVIR-EMTRICITAB-TENOFOV 50-200-25 MG PO TABS: 1.0000 | ORAL_TABLET | Freq: Every day | ORAL | 5 refills | Status: DC

## 2018-03-28 MED ORDER — SULFAMETHOXAZOLE-TRIMETHOPRIM 400-80 MG PO TABS: 1.0000 | ORAL_TABLET | Freq: Every day | ORAL | 1 refills | Status: DC

## 2018-03-28 MED ORDER — SULFAMETHOXAZOLE-TRIMETHOPRIM 400-80 MG PO TABS: 1.0000 | ORAL_TABLET | Freq: Every day | ORAL | 4 refills | Status: DC

## 2018-03-28 MED ORDER — BICTEGRAVIR-EMTRICITAB-TENOFOV 50-200-25 MG PO TABS: 1.0000 | ORAL_TABLET | Freq: Every day | ORAL | 2 refills | Status: DC

## 2018-03-28 MED FILL — BIKTARVY 50-200-25 MG TABS: 50-200-25 | 30 days supply | Qty: 30 | Fill #0

## 2018-03-28 MED FILL — SULFAMETHOXAZOLE-TMP SS TAB: 400-80 | 30 days supply | Qty: 30 | Fill #0

## 2018-03-28 NOTE — Progress Notes (Signed)
HPI: Misty Thomas is a 52 y.o. female is here to see Dr. Linus Salmons for her initial HIV visit.   Allergies: No Known Allergies  Vitals: Temp: 98.6 F (37 C) (05/22 1422) Temp Source: Oral (05/22 1422) BP: 124/82 (05/22 1422) Thomas Rate: 106 (05/22 1422)  Past Medical History: Past Medical History:  Diagnosis Date  . Acquired pancytopenia ([place]) 03/2018  . Anemia    Mild  . Cancer associated pain   . 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 Chemotherapy induced nausea and vomiting   . Diabetes mellitus type 2, diet-controlled ([place])   . Frequency of urination   . History of cellulitis 2018   bilateral lower leg w/ mrsa  . History of external beam radiation therapy 02-05-2018  to 03-16-2018   cervical cancer  . History of MRSA infection 2018   w/ bilateral lower leg cellulitis  . HIV (human immunodeficiency virus infection) ([place])   . Hypomagnesemia 03/2018   Severe---- takes oral magnesium and IV magnesium as needed (cancer center)  . Intermittent diarrhea    due to radiation/ chemo  . Nocturia   . Port-A-Cath in place    Power port  . Radiation burn    LOWER ABDOMIN  . Subclinical hypothyroidism    w/ thyroiditis , dx 01-22-2018 PET scan  . Thrombocytopenia ([place])   . Wears glasses     Social History: Social History   Socioeconomic History  . Marital status: Married    Spouse name: [name]  . Number of children: 3  . Years of education: Not on file  . Highest education level: Not on file  Occupational History  . Not on file  Social Needs  . Financial resource strain: Not on file  . Food insecurity:    Worry: Not on file    Inability: Not on file  . Transportation needs:    Medical: Not on file    Non-medical: Not on file   Tobacco Use  . Smoking status: Never Smoker  . Smokeless tobacco: Never Used  Substance and Sexual Activity  . Alcohol use: Not Currently    Frequency: Never  . Drug use: No  . Sexual activity: Yes    Birth control/protection: Surgical  Lifestyle  . Physical activity:    Days per week: Not on file    Minutes per session: Not on file  . Stress: Not on file  Relationships  . Social connections:    Talks on phone: Not on file    Gets together: Not on file    Attends religious service: Not on file    Active member of club or organization: Not on file    Attends meetings of clubs or organizations: Not on file    Relationship status: Not on file  Other Topics Concern  . Not on file  Social History Narrative  . Not on file    Previous Regimen: None  Current Regimen: None  Labs: HIV 1 RNA Quant (Copies/mL)  Date Value  02/19/2018 1,260 (H)   CD4 T Cell Abs (/uL)  Date Value  02/19/2018 130 (L)  02/09/2018 780   Hep B S Ab (no units)  Date Value  02/19/2018 NON-REACTIVE   Hepatitis B Surface Ag (no units)  Date Value  02/19/2018 NON-REACTIVE  CrCl: Estimated Creatinine Clearance: 72.9 mL/min (by C-G formula based on SCr of 0.98 mg/dL).  Lipids:    Component Value Date/Time   CHOL 218 (H) 02/19/2018 1616   TRIG 247 (H) 02/19/2018 1616   HDL 51 02/19/2018 1616   CHOLHDL 4.3 02/19/2018 1616   LDLCALC 127 (H) 02/19/2018 1616    Assessment: Misty Thomas was recently dx with HIV and cervical CA. She is here today to start therapy with Dr. Linus Salmons. She is currently uninsured so we ended up getting her Biktarvy through [employer]. Misty Thomas started the [facility] process but she doesn't have the financial information with her. Her husband gets about $1200 per month and her son gets about $720 per month for disability.  She has wild type virus and the CD4 is 130. [facility] pharmacy will get her the 30d supply of Bactrim for $5. They can afford that. Once she brings back the  financial paper work, we can get her the additional supply until her ADAP is approved.    We will schedule her husband for a PrEP visit with pharmacy. He is insured.   Recommendations:  Bring back financial paper work for [employer] through [employer] Bactrim SS 1 daily at [facility], PharmD, BCPS, AAHIVP, CPP Clinical Infectious [place] for Infect[facility] 03/28/2018, 4:20 PM

## 2018-03-30 ENCOUNTER — Encounter (HOSPITAL_BASED_OUTPATIENT_CLINIC_OR_DEPARTMENT_OTHER): Payer: Self-pay | Admitting: *Deleted

## 2018-03-30 ENCOUNTER — Telehealth: Payer: Self-pay | Admitting: Pharmacist Clinician (PhC)/ Clinical Pharmacy Specialist

## 2018-03-30 ENCOUNTER — Inpatient Hospital Stay: Payer: Self-pay

## 2018-03-30 ENCOUNTER — Other Ambulatory Visit: Payer: Self-pay

## 2018-03-30 ENCOUNTER — Telehealth: Payer: Self-pay

## 2018-03-30 DIAGNOSIS — C530 Malignant neoplasm of endocervix: Secondary | ICD-10-CM

## 2018-03-30 LAB — CBC WITH DIFFERENTIAL/PLATELET
BASOS PCT: 0 %
Basophils Absolute: 0 10*3/uL (ref 0.0–0.1)
Eosinophils Absolute: 0 10*3/uL (ref 0.0–0.5)
Eosinophils Relative: 0 %
HEMATOCRIT: 26.7 % — AB (ref 34.8–46.6)
HEMOGLOBIN: 8.9 g/dL — AB (ref 11.6–15.9)
LYMPHS ABS: 1.4 10*3/uL (ref 0.9–3.3)
LYMPHS PCT: 26 %
MCH: 29.7 pg (ref 25.1–34.0)
MCHC: 33.2 g/dL (ref 31.5–36.0)
MCV: 89.3 fL (ref 79.5–101.0)
MONO ABS: 0.5 10*3/uL (ref 0.1–0.9)
MONOS PCT: 9 %
Neutro Abs: 3.5 10*3/uL (ref 1.5–6.5)
Neutrophils Relative %: 65 %
Platelets: 414 10*3/uL — ABNORMAL HIGH (ref 145–400)
RBC: 2.99 MIL/uL — AB (ref 3.70–5.45)
RDW: 19.7 % — ABNORMAL HIGH (ref 11.2–14.5)
WBC: 5.5 10*3/uL (ref 3.9–10.3)

## 2018-03-30 LAB — SAMPLE TO BLOOD BANK

## 2018-03-30 NOTE — Telephone Encounter (Signed)
Spoke with pt in lobby to review labs and let her know she does not need blood transfusion today.

## 2018-03-30 NOTE — Progress Notes (Signed)
[facility]      Reason for Consult: new HIV     Referring Physician: Dr. Alvy Bimler    Patient ID: Misty Thomas, female    DOB: 09/29/1966, 52 y.o.   MRN: [id number]  HPI:   She is here with a new recent diagnosis of HIV.  She was diagnosed last month during routine evaluation by Dr. Alvy Bimler who is treating her for cervical cancer.  She has been married for 1 year to her current husband who accompanies her to the appointment and is aware of the diagnosis.  She is uncertain of when she became infected.  No previous test noted in Epic or Care Everywhere.  She has no questions today.  Her CD4 count is 130, viral load 1,260.  For her cervical cancer she is getting weekly Cisplatin.  Her husband recently tested negative for HIV.   Previous record reviewed in Epic for above cancer treatments.  Past Medical History:  Diagnosis Date  . Acquired pancytopenia ([place]) 03/2018  . Anemia    Mild  . Cancer associated pain   . 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 Chemotherapy induced nausea and vomiting   . Diabetes mellitus type 2, diet-controlled ([place])   . Frequency of urination   . History of cellulitis 2018   bilateral lower leg w/ mrsa  . History of external beam radiation therapy 02-05-2018  to 03-16-2018   cervical cancer  . History of MRSA infection 2018   w/ bilateral lower leg cellulitis  . HIV (human immunodeficiency virus infection) ([place])   . Hypomagnesemia 03/2018   Severe---- takes oral magnesium and IV magnesium as needed (cancer center)  . Intermittent diarrhea    due to radiation/ chemo  . Nocturia   . Port-A-Cath in place    Power port  . Radiation burn    LOWER ABDOMIN  . Subclinical hypothyroidism     w/ thyroiditis , dx 01-22-2018 PET scan  . Thrombocytopenia ([place])   . Wears glasses     Prior to Admission medications   Medication Sig Start Date End Date Taking? Authorizing Provider  docusate sodium (COLACE) 100 MG capsule Take 100 mg by mouth every morning.    Yes [provider]  levothyroxine (SYNTHROID, LEVOTHROID) 25 MCG tablet Take 1 tablet (25 mcg total) by mouth daily before breakfast. Patient taking differently: Take 25 mcg by mouth daily before breakfast.  02/08/18  Yes Gorsuch, Ni, MD  magnesium oxide (MAG-OX) 400 (241.3 Mg) MG tablet Take 1 tablet (400 mg total) by mouth 2 (two) times daily. 03/08/18  Yes [name], MD  Misc Natural Products (ESTROVEN + ENERGY MAX STRENGTH PO) Take 1 tablet by mouth daily.   Yes [provider]  morphine (MSIR) 15 MG tablet Take 1 tablet (15 mg total) by mouth every 4 (four) hours as needed for severe pain. 03/26/18  Yes Gery Pray, MD  ondansetron (ZOFRAN) 4 MG tablet Take 1 tablet (4 mg total) by mouth every 8 (eight) hours as needed for nausea or vomiting. 02/06/18  Yes Gery Pray, MD  oxyCODONE-acetaminophen (PERCOCET/ROXICET) 5-325 MG tablet Take 1 tablet by mouth every 4 (four) hours as needed for severe pain. 03/26/18  Yes Gery Pray, MD  bictegravir-emtricitabine-tenofovir AF ([place]) [phone]  MG TABS tablet Take 1 tablet by mouth daily. 03/28/18   Thayer Headings, MD  bictegravir-emtricitabine-tenofovir AF (BIKTARVY) 50-200-25 MG TABS tablet Take 1 tablet by mouth daily. 03/28/18   Comer, Okey Regal, MD  sulfamethoxazole-trimethoprim (BACTRIM) 400-80 MG tablet Take 1 tablet by mouth daily. 03/28/18   Comer, Okey Regal, MD  sulfamethoxazole-trimethoprim (BACTRIM) 400-80 MG tablet Take 1 tablet by mouth daily. 03/28/18   Comer, Okey Regal, MD  prochlorperazine (COMPAZINE) 10 MG tablet Take 1 tablet (10 mg total) by mouth every 6 (six) hours as needed (Nausea or vomiting). 03/08/18 03/15/18  [name], MD    No Known  Allergies  Social History   Tobacco Use  . Smoking status: Never Smoker  . Smokeless tobacco: Never Used  Substance Use Topics  . Alcohol use: Not Currently    Frequency: Never  . Drug use: No    Family History  Problem Relation Age of Onset  . Cancer Maternal Grandmother        unknown ca    Review of Systems  Constitutional: negative for fevers, chills, malaise and anorexia Gastrointestinal: negative for nausea, vomiting and diarrhea; no dysphagia Integument/breast: negative for rash Musculoskeletal: negative for myalgias and arthralgias All other systems reviewed and are negative    Constitutional: in no apparent distress  Vitals:   03/28/18 1422  BP: 124/82  Pulse: (!) 106  Temp: 98.6 F (37 C)   EYES: anicteric ENMT: no thrush Cardiovascular: Cor RRR Respiratory: CTA B; normal respiratory effort GI: Bowel sounds are normal, liver is not enlarged, spleen is not enlarged Musculoskeletal: no pedal edema noted Skin: negatives: no rash Hematologic: no cervical lad  Labs: Lab Results  Component Value Date   WBC 3.7 (L) 2020-03-1218   HGB 8.2 (L) 2020-03-1218   HCT 24.5 (L) 2020-03-1218   MCV 87.9 2020-03-1218   PLT 324 2020-03-1218    Lab Results  Component Value Date   CREATININE 0.98 2020-03-1218   BUN 18 2020-03-1218   NA 136 2020-03-1218   K 4.2 2020-03-1218   CL 102 2020-03-1218   CO2 27 2020-03-1218    Lab Results  Component Value Date   ALT 16 2020-03-1218   AST 17 2020-03-1218   ALKPHOS 78 2020-03-1218   BILITOT <0.2 (L) 2020-03-1218   INR 1.03 02/01/2018     Assessment: New HIV.  I discussed the natural history of HIV, life-expectancy on treatment, course of disease if left untreated, treatment options.  I discussed resistance and how it occurs.  I discussed expectations of vaccines, blood test monitoring.  She had no other questions.    Plan: 1) start [place] 2) labs in 4 weeks with follow up with me after 3) hepatitis A, B vaccines, Pneumovax, Menveo next visit

## 2018-03-30 NOTE — Telephone Encounter (Signed)
Juliann Pulse said that she will have active insurance by June 1.

## 2018-03-30 NOTE — Progress Notes (Signed)
Spoke w/ pt via phone for pre-op interview.  Npo after mn.  Arrive at 0530.  Current lab results, dated 05-21-219, and ekg in chart and epic.  Will take colace , synthroid, and magnesium am dos w/ sips of water.

## 2018-04-03 NOTE — Anesthesia Preprocedure Evaluation (Addendum)
Anesthesia Evaluation  Patient identified by MRN, date of birth, ID band Patient awake    Reviewed: Allergy & Precautions, NPO status , Patient's Chart, lab work & pertinent test results  History of Anesthesia Complications Negative for: history of anesthetic complications  Airway Mallampati: II  TM Distance: <3 FB Neck ROM: Limited    Dental  (+) Teeth Intact, Dental Advisory Given   Pulmonary neg pulmonary ROS,    Pulmonary exam normal        Cardiovascular (-) anginanegative cardio ROS Normal cardiovascular exam  20-Mar-2018  Normal sinus rhythm Normal ECG   Neuro/Psych negative neurological ROS     GI/Hepatic negative GI ROS, Neg liver ROS,   Endo/Other  diabetes, Well Controlled, Type 2, Oral Hypoglycemic AgentsHypothyroidism Morbid obesity  Renal/GU negative Renal ROS   Cervical cancer: chemo    Musculoskeletal   Abdominal (+) + obese,   Peds  Hematology  (+) Blood dyscrasia (pancytopenia), anemia , HIV, Hb 9.5 HIV +   Anesthesia Other Findings   Reproductive/Obstetrics                           Anesthesia Physical  Anesthesia Plan  ASA: III  Anesthesia Plan: General   Post-op Pain Management:    Induction: Intravenous  PONV Risk Score and Plan: 3 and Dexamethasone, Scopolamine patch - Pre-op and Ondansetron  Airway Management Planned: LMA  Additional Equipment:   Intra-op Plan:   Post-operative Plan: Extubation in OR  Informed Consent: I have reviewed the patients History and Physical, chart, labs and discussed the procedure including the risks, benefits and alternatives for the proposed anesthesia with the patient or authorized representative who has indicated his/her understanding and acceptance.   Dental advisory given  Plan Discussed with: Anesthesiologist  Anesthesia Plan Comments:         Anesthesia Quick Evaluation

## 2018-04-04 ENCOUNTER — Ambulatory Visit (HOSPITAL_BASED_OUTPATIENT_CLINIC_OR_DEPARTMENT_OTHER): Payer: Self-pay | Admitting: Anesthesiology

## 2018-04-04 ENCOUNTER — Ambulatory Visit (HOSPITAL_BASED_OUTPATIENT_CLINIC_OR_DEPARTMENT_OTHER)
Admission: RE | Admit: 2018-04-04 | Discharge: 2018-04-04 | Disposition: A | Discharge status: Home / Self Care | Payer: Self-pay | Source: Ambulatory Visit | Attending: Radiation Oncology | Admitting: Radiation Oncology

## 2018-04-04 ENCOUNTER — Encounter (HOSPITAL_BASED_OUTPATIENT_CLINIC_OR_DEPARTMENT_OTHER): Payer: Self-pay

## 2018-04-04 ENCOUNTER — Encounter (HOSPITAL_BASED_OUTPATIENT_CLINIC_OR_DEPARTMENT_OTHER)
Admission: RE | Disposition: A | Discharge status: Home / Self Care | Payer: Self-pay | Source: Ambulatory Visit | Attending: Radiation Oncology

## 2018-04-04 ENCOUNTER — Ambulatory Visit (HOSPITAL_COMMUNITY)
Admission: RE | Admit: 2018-04-04 | Discharge: 2018-04-04 | Disposition: A | Discharge status: Home / Self Care | Payer: Self-pay | Source: Ambulatory Visit | Attending: Radiation Oncology | Admitting: Radiation Oncology

## 2018-04-04 ENCOUNTER — Ambulatory Visit
Admission: RE | Admit: 2018-04-04 | Discharge: 2018-04-04 | Disposition: A | Discharge status: Home / Self Care | Payer: BLUE CROSS/BLUE SHIELD | Source: Ambulatory Visit | Attending: Radiation Oncology | Admitting: Radiation Oncology

## 2018-04-04 VITALS — BP 115/63 | HR 76 | Resp 18

## 2018-04-04 DIAGNOSIS — E039 Hypothyroidism, unspecified: Secondary | ICD-10-CM | POA: Insufficient documentation

## 2018-04-04 DIAGNOSIS — C539 Malignant neoplasm of cervix uteri, unspecified: Secondary | ICD-10-CM | POA: Insufficient documentation

## 2018-04-04 DIAGNOSIS — C530 Malignant neoplasm of endocervix: Secondary | ICD-10-CM

## 2018-04-04 DIAGNOSIS — Z8614 Personal history of Methicillin resistant Staphylococcus aureus infection: Secondary | ICD-10-CM | POA: Insufficient documentation

## 2018-04-04 DIAGNOSIS — Z79891 Long term (current) use of opiate analgesic: Secondary | ICD-10-CM | POA: Insufficient documentation

## 2018-04-04 DIAGNOSIS — Z79890 Hormone replacement therapy (postmenopausal): Secondary | ICD-10-CM | POA: Insufficient documentation

## 2018-04-04 DIAGNOSIS — B20 Human immunodeficiency virus [HIV] disease: Secondary | ICD-10-CM | POA: Insufficient documentation

## 2018-04-04 DIAGNOSIS — Z21 Asymptomatic human immunodeficiency virus [HIV] infection status: Secondary | ICD-10-CM

## 2018-04-04 DIAGNOSIS — Z79899 Other long term (current) drug therapy: Secondary | ICD-10-CM | POA: Insufficient documentation

## 2018-04-04 DIAGNOSIS — Z6839 Body mass index (BMI) 39.0-39.9, adult: Secondary | ICD-10-CM | POA: Insufficient documentation

## 2018-04-04 DIAGNOSIS — Z7984 Long term (current) use of oral hypoglycemic drugs: Secondary | ICD-10-CM | POA: Insufficient documentation

## 2018-04-04 DIAGNOSIS — E119 Type 2 diabetes mellitus without complications: Secondary | ICD-10-CM | POA: Insufficient documentation

## 2018-04-04 HISTORY — PX: TANDEM RING INSERTION: SHX6199

## 2018-04-04 LAB — GLUCOSE, CAPILLARY: GLUCOSE-CAPILLARY: 148 mg/dL — AB (ref 65–99)

## 2018-04-04 SURGERY — INSERTION, UTERINE TANDEM AND RING OR CYLINDER, FOR BRACHYTHERAPY
Anesthesia: General | Site: Vagina

## 2018-04-04 MED ORDER — HYDROMORPHONE HCL 4 MG/ML IJ SOLN: 0.5000 mg | Freq: Once | INTRAMUSCULAR | Status: DC

## 2018-04-04 MED ORDER — SODIUM CHLORIDE 0.9 % IR SOLN
Status: DC | PRN
  Administered 2018-04-04: 1000 mL

## 2018-04-04 MED ORDER — FENTANYL CITRATE (PF) 100 MCG/2ML IJ SOLN
INTRAMUSCULAR | Status: AC
  Filled 2018-04-04: qty 2

## 2018-04-04 MED ORDER — KETOROLAC TROMETHAMINE 30 MG/ML IJ SOLN
INTRAMUSCULAR | Status: AC
  Filled 2018-04-04: qty 1

## 2018-04-04 MED ORDER — ONDANSETRON HCL 4 MG/2ML IJ SOLN
INTRAMUSCULAR | Status: AC
  Filled 2018-04-04: qty 2

## 2018-04-04 MED ORDER — LACTATED RINGERS IV SOLN
INTRAVENOUS | Status: DC
  Administered 2018-04-04: 10:00:00 via INTRAVENOUS
  Filled 2018-04-04 (×2): qty 250

## 2018-04-04 MED ORDER — MIDAZOLAM HCL 2 MG/2ML IJ SOLN
INTRAMUSCULAR | Status: DC | PRN
  Administered 2018-04-04: 2 mg via INTRAVENOUS

## 2018-04-04 MED ORDER — PROPOFOL 10 MG/ML IV BOLUS
INTRAVENOUS | Status: AC
  Filled 2018-04-04: qty 40

## 2018-04-04 MED ORDER — FENTANYL CITRATE (PF) 100 MCG/2ML IJ SOLN
50.0000 ug | Freq: Once | INTRAMUSCULAR | Status: DC
  Filled 2018-04-04: qty 1

## 2018-04-04 MED ORDER — LIDOCAINE 2% (20 MG/ML) 5 ML SYRINGE
INTRAMUSCULAR | Status: AC
  Filled 2018-04-04: qty 5

## 2018-04-04 MED ORDER — HYDROMORPHONE HCL 1 MG/ML IJ SOLN
0.5000 mg | Freq: Once | INTRAMUSCULAR | Status: AC
  Administered 2018-04-04: 0.5 mg via INTRAVENOUS
  Filled 2018-04-04: qty 1

## 2018-04-04 MED ORDER — FENTANYL CITRATE (PF) 100 MCG/2ML IJ SOLN
INTRAMUSCULAR | Status: DC | PRN
  Administered 2018-04-04 (×2): 50 ug via INTRAVENOUS

## 2018-04-04 MED ORDER — ONDANSETRON HCL 4 MG/2ML IJ SOLN
INTRAMUSCULAR | Status: DC | PRN
  Administered 2018-04-04: 4 mg via INTRAVENOUS

## 2018-04-04 MED ORDER — DEXAMETHASONE SODIUM PHOSPHATE 10 MG/ML IJ SOLN
INTRAMUSCULAR | Status: DC | PRN
  Administered 2018-04-04: 10 mg via INTRAVENOUS

## 2018-04-04 MED ORDER — MIDAZOLAM HCL 2 MG/2ML IJ SOLN
INTRAMUSCULAR | Status: AC
  Filled 2018-04-04: qty 2

## 2018-04-04 MED ORDER — SCOPOLAMINE 1 MG/3DAYS TD PT72
1.0000 | MEDICATED_PATCH | Freq: Once | TRANSDERMAL | Status: DC
  Administered 2018-04-04: 1.5 mg via TRANSDERMAL
  Filled 2018-04-04: qty 1

## 2018-04-04 MED ORDER — FENTANYL CITRATE (PF) 100 MCG/2ML IJ SOLN
25.0000 ug | INTRAMUSCULAR | Status: DC | PRN
  Administered 2018-04-04 (×4): 50 ug via INTRAVENOUS
  Filled 2018-04-04: qty 1

## 2018-04-04 MED ORDER — ARTIFICIAL TEARS OPHTHALMIC OINT
TOPICAL_OINTMENT | OPHTHALMIC | Status: AC
  Filled 2018-04-04: qty 3.5

## 2018-04-04 MED ORDER — LIDOCAINE 2% (20 MG/ML) 5 ML SYRINGE
INTRAMUSCULAR | Status: DC | PRN
  Administered 2018-04-04: 80 mg via INTRAVENOUS

## 2018-04-04 MED ORDER — HYDROMORPHONE HCL 1 MG/ML IJ SOLN
INTRAMUSCULAR | Status: AC
  Filled 2018-04-04: qty 1

## 2018-04-04 MED ORDER — PROPOFOL 10 MG/ML IV BOLUS
INTRAVENOUS | Status: DC | PRN
  Administered 2018-04-04: 25 mg via INTRAVENOUS
  Administered 2018-04-04: 200 mg via INTRAVENOUS

## 2018-04-04 MED ORDER — SCOPOLAMINE 1 MG/3DAYS TD PT72
MEDICATED_PATCH | TRANSDERMAL | Status: AC
  Filled 2018-04-04: qty 1

## 2018-04-04 MED ORDER — LACTATED RINGERS IV SOLN
INTRAVENOUS | Status: DC
  Administered 2018-04-04: 06:00:00 via INTRAVENOUS
  Filled 2018-04-04 (×2): qty 1000

## 2018-04-04 MED ORDER — PROMETHAZINE HCL 25 MG/ML IJ SOLN
6.2500 mg | INTRAMUSCULAR | Status: DC | PRN
  Filled 2018-04-04: qty 1

## 2018-04-04 MED ORDER — DEXAMETHASONE SODIUM PHOSPHATE 10 MG/ML IJ SOLN
INTRAMUSCULAR | Status: AC
Start: 2018-04-04 — End: ?
  Filled 2018-04-04: qty 1

## 2018-04-04 MED ORDER — DEXAMETHASONE SODIUM PHOSPHATE 10 MG/ML IJ SOLN
INTRAMUSCULAR | Status: AC
  Filled 2018-04-04: qty 1

## 2018-04-04 MED ORDER — MEPERIDINE HCL 25 MG/ML IJ SOLN
6.2500 mg | INTRAMUSCULAR | Status: DC | PRN
  Filled 2018-04-04: qty 1

## 2018-04-04 MED ORDER — KETOROLAC TROMETHAMINE 30 MG/ML IJ SOLN
INTRAMUSCULAR | Status: DC | PRN
  Administered 2018-04-04 (×2): 30 mg via INTRAVENOUS

## 2018-04-04 SURGICAL SUPPLY — 34 items
BAG URINE DRAINAGE (UROLOGICAL SUPPLIES) ×3 IMPLANT
BNDG CONFORM 2 STRL LF (GAUZE/BANDAGES/DRESSINGS) IMPLANT
CATH FOLEY 2WAY SLVR  5CC 16FR (CATHETERS) ×2
CATH FOLEY 2WAY SLVR 5CC 16FR (CATHETERS) ×1 IMPLANT
GLOVE BIO SURGEON STRL SZ7.5 (GLOVE) ×6 IMPLANT
GLOVE BIOGEL PI IND STRL 8.5 (GLOVE) IMPLANT
GLOVE BIOGEL PI INDICATOR 8.5 (GLOVE) ×4
GLOVE SS BIOGEL STRL SZ 8.5 (GLOVE) IMPLANT
GLOVE SUPERSENSE BIOGEL SZ 8.5 (GLOVE) ×4
GOWN STRL REUS W/ TWL LRG LVL3 (GOWN DISPOSABLE) ×2 IMPLANT
GOWN STRL REUS W/TWL LRG LVL3 (GOWN DISPOSABLE) ×6
HOLDER FOLEY CATH W/STRAP (MISCELLANEOUS) ×3 IMPLANT
HOVERMATT SINGLE USE (MISCELLANEOUS) ×3 IMPLANT
KIT TURNOVER CYSTO (KITS) ×3 IMPLANT
NDL SPNL 22GX3.5 QUINCKE BK (NEEDLE) IMPLANT
NEEDLE SPNL 22GX3.5 QUINCKE BK (NEEDLE) IMPLANT
PACK VAGINAL MINOR WOMEN LF (CUSTOM PROCEDURE TRAY) ×3 IMPLANT
PACKING VAGINAL (PACKING) IMPLANT
PAD ABD 8X10 STRL (GAUZE/BANDAGES/DRESSINGS) ×3 IMPLANT
PAD OB MATERNITY 4.3X12.25 (PERSONAL CARE ITEMS) IMPLANT
PLUG CATH AND CAP STER (CATHETERS) IMPLANT
SET IRRIG Y TYPE TUR BLADDER L (SET/KITS/TRAYS/PACK) ×3 IMPLANT
SUT PROLENE 0 SH 30 (SUTURE) IMPLANT
SUT SILK 2 0 30  PSL (SUTURE)
SUT SILK 2 0 30 PSL (SUTURE) IMPLANT
SYR BULB IRRIGATION 50ML (SYRINGE) IMPLANT
SYR CONTROL 10ML LL (SYRINGE) IMPLANT
SYRINGE 10CC LL (SYRINGE) ×3 IMPLANT
TOWEL OR 17X24 6PK STRL BLUE (TOWEL DISPOSABLE) ×6 IMPLANT
TUBE CONNECTING 12'X1/4 (SUCTIONS)
TUBE CONNECTING 12X1/4 (SUCTIONS) IMPLANT
WATER STERILE IRR 3000ML UROMA (IV SOLUTION) ×3 IMPLANT
WATER STERILE IRR 500ML POUR (IV SOLUTION) ×3 IMPLANT
YANKAUER SUCT BULB TIP NO VENT (SUCTIONS) IMPLANT

## 2018-04-04 NOTE — Interval H&P Note (Signed)
History and Physical Interval Note:  04/04/2018 7:22 AM  Misty Thomas  has presented today for surgery, with the diagnosis of ENDOCERVIX CANER  The various methods of treatment have been discussed with the patient and family. After consideration of risks, benefits and other options for treatment, the patient has consented to  Procedure(s): TANDEM RING INSERTION (N/A) as a surgical intervention .  The patient's history has been reviewed, patient examined, no change in status, stable for surgery.  I have reviewed the patient's chart and labs.  Questions were answered to the patient's satisfaction.     Gery Pray

## 2018-04-04 NOTE — Transfer of Care (Signed)
Immediate Anesthesia Transfer of Care Note  Patient: Misty Thomas  Procedure(s) Performed: TANDEM RING INSERTION (N/A Vagina )  Patient Location: PACU  Anesthesia Type:General  Level of Consciousness: sedated and patient cooperative  Airway & Oxygen Therapy: Patient Spontanous Breathing and Patient connected to nasal cannula oxygen  Post-op Assessment: Report given to RN and Post -op Vital signs reviewed and stable  Post vital signs: Reviewed and stable  Last Vitals:  Vitals Value Taken Time  BP    Temp    Pulse    Resp 17 04/04/2018  8:08 AM  SpO2    Vitals shown include unvalidated device data.  Last Pain:  Vitals:   04/04/18 0607  TempSrc:   PainSc: 0-No pain      Patients Stated Pain Goal: 4 (10/25/74 8832)  Complications: No apparent anesthesia complications

## 2018-04-04 NOTE — Progress Notes (Signed)
  Radiation Oncology         [phone] ________________________________  Name: Misty Thomas MRN: [id number]  Date: 04/04/2018  DOB: 1966/05/13  SIMULATION AND TREATMENT PLANNING NOTE HDR BRACHYTHERAPY  DIAGNOSIS:  Malignant neoplasm of endocervix ([place]).Stage IIIB, squamous cell carcinoma of the cervix    NARRATIVE:  The patient was brought to the [facility].  Identity was confirmed.  All relevant records and images related to the planned course of therapy were reviewed.  The patient freely provided informed written consent to proceed with treatment after reviewing the details related to the planned course of therapy. The consent form was witnessed and verified by the simulation staff.  Then, the patient was set-up in a stable reproducible  supine position for radiation therapy.  CT images were obtained.  Surface markings were placed.  The CT images were loaded into the planning software.  Then the target and avoidance structures were contoured.  Treatment planning then occurred.  The radiation prescription was entered and confirmed.   I have requested : Brachytherapy Isodose Plan and Dosimetry Calculations to plan the radiation distribution.    PLAN:  The patient will receive 5.5 Gy in 1 fraction directed at the high risk clinical target volume. Patient will be treated with iridium 192 as the high-dose-rate source.    ________________________________  Blair Promise, PhD, MD

## 2018-04-04 NOTE — Discharge Instructions (Signed)

## 2018-04-04 NOTE — Anesthesia Procedure Notes (Signed)
Procedure Name: LMA Insertion Date/Time: 04/04/2018 7:35 AM Performed by: Wanita Chamberlain, CRNA Pre-anesthesia Checklist: Patient identified, Timeout performed, Emergency Drugs available, Suction available and Patient being monitored Patient Re-evaluated:Patient Re-evaluated prior to induction Oxygen Delivery Method: Circle system utilized Preoxygenation: Pre-oxygenation with 100% oxygen Induction Type: IV induction Ventilation: Mask ventilation without difficulty LMA: LMA inserted LMA Size: 4.0 Number of attempts: 1 Placement Confirmation: CO2 detector,  positive ETCO2 and breath sounds checked- equal and bilateral Tube secured with: Tape Dental Injury: Teeth and Oropharynx as per pre-operative assessment

## 2018-04-04 NOTE — Progress Notes (Signed)
  Radiation Oncology         [phone] ________________________________  Name: Misty Thomas MRN: [id number]  Date: 04/04/2018  DOB: November 22, 1965  CC: Default, Provider, MD  Everitt Amber, MD  HDR BRACHYTHERAPY NOTE  DIAGNOSIS: Malignant neoplasm of endocervix ([place]).Stage IIIB, squamous cell carcinoma of the cervix  NARRATIVE: The patient was brought to the HDR suite. Identity was confirmed. All relevant records and images related to the planned course of therapy were reviewed. The patient freely provided informed written consent to proceed with treatment after reviewing the details related to the planned course of therapy. The consent form was witnessed and verified by the simulation staff. Then, the patient was set-up in a stable reproducible supine position for radiation therapy. The tandem ring system was accessed and fiducial markers were placed within the tandem and ring.   Simple treatment device note: On the operating room the patient had construction of her custom tandem ring system. She will be treated with a 60 tandem/ring system. The patient had placement of a 60 mm tandem. A cervical ring with a small shielding cap was used for her treatment. A rectal paddle was also part of her custom set up device.  Verification simulation note: An AP and lateral film was obtained through the pelvis area. This was compared to the patient's planning films documenting accurate position of the tandem/ring system for treatment.  High-dose-rate brachytherapy treatment note:  The remote afterloading device was accessed through catheter system and attached to the tandem ring system. Patient then proceeded to undergo her third high-dose-rate treatment directed at the cervix. The patient was prescribed a dose of 5.5 gray to be delivered to the high-risk clinical target volume.. Patient was treated with 2 channels using 26 dwell positions. Treatment time was 568.30 seconds. The patient tolerated the  procedure well. After completion of her therapy, a radiation survey was performed documenting return of the iridium source into the GammaMed safe. The patient was then transferred to the nursing suite. She then had removal of the rectal paddle followed by the tandem and ring system. The patient tolerated the removal well.  PLAN: She will follow up in 1 week for her fourth HDR treatment.  ________________________________  Blair Promise, PhD, MD  This document serves as a record of services personally performed by Gery Pray, MD. It was created on his behalf by Arlyce Harman, a trained medical scribe. The creation of this record is based on the scribe's personal observations and the provider's statements to them. This document has been checked and approved by the attending provider.

## 2018-04-04 NOTE — Op Note (Signed)
04/04/2018  8:37 AM  PATIENT:  Misty Thomas  52 y.o. female  PRE-OPERATIVE DIAGNOSIS:  ENDOCERVIX CANER  POST-OPERATIVE DIAGNOSIS:  ENDOCERVIX CANER  PROCEDURE:  Procedure(s): TANDEM RING INSERTION (N/A)  SURGEON:  Surgeon(s) and Role:    * Gery Pray, MD - Primary  PHYSICIAN ASSISTANT:   ASSISTANTS: none   ANESTHESIA:   general  EBL:  0 mL   BLOOD ADMINISTERED:none  DRAINS: Urinary Catheter (Foley)   LOCAL MEDICATIONS USED:  NONE  SPECIMEN:  No Specimen  DISPOSITION OF SPECIMEN:  N/A  COUNTS:  YES  TOURNIQUET:  * No tourniquets in log *  DICTATION: patient transported to the OR 4, outpatient facility. She was prepped and draped in the usual sterile fashion and placed in the dorsal lithotomy position. A timeout for the procedure, estimated time of the procedure, preoperative Allergies and antibiotics was reviewed. The patient had placement of a Foley catheter without difficulty. The bladder was then back filled with approximately 250 mL of sterile water. Patient proceeded to undergo intraoperative ultrasound through transabdominal approach. Excellent images were obtained with the new ultrasound equipment despite the patient's body habitus.On exam under anesthesia the patient was noted to have significant shrinkage of her large cervical mass. The patient continues to have necrotic tumor at the cervical os. There is no obvious parametrial extension at this time although exam somewhat difficult given the patient's weight issues. On bimanual and rectovaginal examination the patient was noted to have a prominent left perirectal lymph node which was estimated to be approximately 2 x 3 cm in size. This was noted on initial imaging studies. This node was covered by her external beam radiation fields.There was no rectal mucosal involvement by this large perirectal node. On intraoperative ultrasound images the patient was noted to have a large uterine fibroid which is located  primarily along the right side of the uterus and anterior.  Cervical dilation and sounding was much easier this week compared to week 1, suggesting some shrinkage of the endocervical component of the lesion. The uterus sounded to approximately 7.5 cm. Patient proceeded to undergo serial dilation of the cervical os. A 60 mm cervical sleeve was placed in the endocervical and endometrial canal canal. Excellent reimaging images were obtained.  . I was able to place of a 60 60 mm tandem with ultrasound guidance. This did show good placement within the endometrial canal.The patient then had a 60 degree ring with a small shielding cap attached to the tandem followed by a rectal paddle. Patient tolerated the procedure well area and she was subsequently transported to the recovery room in stable condition. Later in the day the patient will be transferred to radiation oncology for planning and her third high-dose-rate treatment. She will receive 2 additional high-dose rate treatments after today which will complete her definitive course of therapy.    PLAN OF CARE: transferred to radiation oncology for planning and treatment  PATIENT DISPOSITION:  PACU - hemodynamically stable.   Delay start of Pharmacological VTE agent (>24hrs) due to surgical blood loss or risk of bleeding: not applicable

## 2018-04-05 NOTE — Anesthesia Postprocedure Evaluation (Signed)
Anesthesia Post Note  Patient: Misty Thomas  Procedure(s) Performed: TANDEM RING INSERTION (N/A Vagina )     Patient location during evaluation: PACU Anesthesia Type: General Level of consciousness: sedated and patient cooperative Pain management: pain level controlled Vital Signs Assessment: post-procedure vital signs reviewed and stable Respiratory status: spontaneous breathing Cardiovascular status: stable Anesthetic complications: no    Last Vitals:  Vitals:   04/04/18 0830 04/04/18 0845  BP: 138/85 119/77  Pulse: 87 87  Resp: 13 12  Temp:    SpO2: 95% 97%    Last Pain:  Vitals:   04/04/18 0845  TempSrc:   PainSc: [place]

## 2018-04-06 ENCOUNTER — Encounter (HOSPITAL_BASED_OUTPATIENT_CLINIC_OR_DEPARTMENT_OTHER): Payer: Self-pay | Admitting: Radiation Oncology

## 2018-04-06 ENCOUNTER — Other Ambulatory Visit: Payer: Self-pay

## 2018-04-06 NOTE — Progress Notes (Addendum)
Spoke w/ pt via phone for pre-op interview.  Npo after mn.  Arrive at 0530.  Current ekg in chart and epic.  Dr Sondra Come ordered pre-op lab work, pt has lab appointment at cancer center Wednesday 04-11-2018 @ 1030, the labs will be done there.  Pt will take colace, synthroid, and magnesium am dos w/ sips of water.   ADDENDUM:  Pt getting lab work done , ordered by dr Sondra Come, Wednesday 04-11-2018 @ 0900.

## 2018-04-09 ENCOUNTER — Encounter: Payer: Self-pay | Admitting: *Deleted

## 2018-04-11 ENCOUNTER — Inpatient Hospital Stay: Payer: BLUE CROSS/BLUE SHIELD

## 2018-04-11 ENCOUNTER — Inpatient Hospital Stay: Payer: BLUE CROSS/BLUE SHIELD | Attending: Gynecologic Oncology

## 2018-04-11 ENCOUNTER — Ambulatory Visit: Payer: BLUE CROSS/BLUE SHIELD

## 2018-04-11 ENCOUNTER — Encounter: Payer: Self-pay | Admitting: Hematology and Oncology

## 2018-04-11 ENCOUNTER — Telehealth: Payer: Self-pay | Admitting: Hematology and Oncology

## 2018-04-11 ENCOUNTER — Inpatient Hospital Stay (HOSPITAL_BASED_OUTPATIENT_CLINIC_OR_DEPARTMENT_OTHER): Payer: BLUE CROSS/BLUE SHIELD | Admitting: Hematology and Oncology

## 2018-04-11 ENCOUNTER — Encounter (HOSPITAL_COMMUNITY)
Admission: RE | Admit: 2018-04-11 | Discharge: 2018-04-11 | Disposition: A | Discharge status: Home / Self Care | Payer: BLUE CROSS/BLUE SHIELD | Source: Ambulatory Visit | Attending: Radiation Oncology | Admitting: Radiation Oncology

## 2018-04-11 VITALS — BP 103/60 | HR 89 | Temp 98.5°F | Resp 18 | Ht 61.0 in | Wt 212.1 lb

## 2018-04-11 DIAGNOSIS — D61818 Other pancytopenia: Secondary | ICD-10-CM

## 2018-04-11 DIAGNOSIS — E119 Type 2 diabetes mellitus without complications: Secondary | ICD-10-CM | POA: Diagnosis not present

## 2018-04-11 DIAGNOSIS — D649 Anemia, unspecified: Secondary | ICD-10-CM

## 2018-04-11 DIAGNOSIS — Z21 Asymptomatic human immunodeficiency virus [HIV] infection status: Secondary | ICD-10-CM

## 2018-04-11 DIAGNOSIS — G893 Neoplasm related pain (acute) (chronic): Secondary | ICD-10-CM | POA: Insufficient documentation

## 2018-04-11 DIAGNOSIS — D259 Leiomyoma of uterus, unspecified: Secondary | ICD-10-CM | POA: Diagnosis not present

## 2018-04-11 DIAGNOSIS — C530 Malignant neoplasm of endocervix: Secondary | ICD-10-CM

## 2018-04-11 DIAGNOSIS — Z01818 Encounter for other preprocedural examination: Secondary | ICD-10-CM | POA: Diagnosis present

## 2018-04-11 DIAGNOSIS — Z79899 Other long term (current) drug therapy: Secondary | ICD-10-CM | POA: Diagnosis not present

## 2018-04-11 DIAGNOSIS — Z7984 Long term (current) use of oral hypoglycemic drugs: Secondary | ICD-10-CM | POA: Diagnosis not present

## 2018-04-11 LAB — CBC WITH DIFFERENTIAL/PLATELET
BASOS ABS: 0 10*3/uL (ref 0.0–0.1)
BASOS PCT: 0 %
BASOS PCT: 0 %
Basophils Absolute: 0 10*3/uL (ref 0.0–0.1)
EOS ABS: 0 10*3/uL (ref 0.0–0.5)
Eosinophils Absolute: 0 10*3/uL (ref 0.0–0.7)
Eosinophils Relative: 0 %
Eosinophils Relative: 0 %
HEMATOCRIT: 28.6 % — AB (ref 34.8–46.6)
HEMATOCRIT: 29.1 % — AB (ref 36.0–46.0)
HEMOGLOBIN: 9.2 g/dL — AB (ref 11.6–15.9)
HEMOGLOBIN: 9.2 g/dL — AB (ref 12.0–15.0)
LYMPHS ABS: 1.2 10*3/uL (ref 0.9–3.3)
Lymphocytes Relative: 31 %
Lymphocytes Relative: 29 %
Lymphs Abs: 1 10*3/uL (ref 0.7–4.0)
MCH: 30 pg (ref 25.1–34.0)
MCH: 29.6 pg (ref 26.0–34.0)
MCHC: 32.2 g/dL (ref 31.5–36.0)
MCHC: 31.6 g/dL (ref 30.0–36.0)
MCV: 93.2 fL (ref 79.5–101.0)
MCV: 93.6 fL (ref 78.0–100.0)
MONOS PCT: 15 %
Monocytes Absolute: 0.6 10*3/uL (ref 0.1–0.9)
Monocytes Absolute: 0.4 10*3/uL (ref 0.1–1.0)
Monocytes Relative: 12 %
NEUTROS ABS: 2.1 10*3/uL (ref 1.5–6.5)
NEUTROS ABS: 2.1 10*3/uL (ref 1.7–7.7)
NEUTROS PCT: 54 %
NEUTROS PCT: 59 %
Platelets: 330 10*3/uL (ref 145–400)
Platelets: 411 10*3/uL — ABNORMAL HIGH (ref 150–400)
RBC: 3.07 MIL/uL — AB (ref 3.70–5.45)
RBC: 3.11 MIL/uL — ABNORMAL LOW (ref 3.87–5.11)
RDW: 19.1 % — ABNORMAL HIGH (ref 11.2–14.5)
RDW: 19.2 % — ABNORMAL HIGH (ref 11.5–15.5)
WBC: 3.6 10*3/uL — AB (ref 4.0–10.5)
WBC: 3.9 10*3/uL (ref 3.9–10.3)

## 2018-04-11 LAB — BASIC METABOLIC PANEL
Anion gap: 6 (ref 5–15)
BUN: 22 mg/dL — ABNORMAL HIGH (ref 6–20)
CALCIUM: 9.9 mg/dL (ref 8.9–10.3)
CHLORIDE: 107 mmol/L (ref 101–111)
CO2: 26 mmol/L (ref 22–32)
Creatinine, Ser: 1.33 mg/dL — ABNORMAL HIGH (ref 0.44–1.00)
GFR calc non Af Amer: 45 mL/min — ABNORMAL LOW (ref >60)
GFR, EST AFRICAN AMERICAN: 52 mL/min — AB (ref >60)
Glucose, Bld: 163 mg/dL — ABNORMAL HIGH (ref 65–99)
Potassium: 4.7 mmol/L (ref 3.5–5.1)
SODIUM: 139 mmol/L (ref 135–145)

## 2018-04-11 LAB — SAMPLE TO BLOOD BANK

## 2018-04-11 MED ORDER — MORPHINE SULFATE 15 MG PO TABS: 15.0000 mg | ORAL_TABLET | ORAL | 0 refills | Status: DC | PRN

## 2018-04-11 NOTE — Assessment & Plan Note (Signed)
The patient is recovering well from side effects of chemotherapy Anemia is improving She does not need blood transfusion today We discussed chronic pain management I recommend discontinuation of oxycodone and to stick with morphine sulfate only along with Tylenol as needed We also discussed morphine taper After completion of radiation treatment, I plan to repeat imaging study in September and to see her back to review test results The meantime, I will schedule flush appointment to keep her port patent

## 2018-04-11 NOTE — Telephone Encounter (Signed)
Gave avs and calendar ° °

## 2018-04-11 NOTE — Assessment & Plan Note (Signed)
She is receiving antiretroviral treatment as directed by infectious disease team 

## 2018-04-11 NOTE — Assessment & Plan Note (Signed)
Overall, anemia is improving She does not need blood transfusion today

## 2018-04-11 NOTE — Assessment & Plan Note (Signed)
We have extensive discussion about chronic pain management I am hopeful she can get herself off pain medicine after completion of treatment We discussed medication taper and narcotic refill policy

## 2018-04-11 NOTE — Progress Notes (Signed)
[place] OFFICE PROGRESS NOTE  Patient Care Team: Default, Provider, MD as PCP - General  ASSESSMENT & PLAN:  Cervical cancer [facility]) The patient is recovering well from side effects of chemotherapy Anemia is improving She does not need blood transfusion today We discussed chronic pain management I recommend discontinuation of oxycodone and to stick with morphine sulfate only along with Tylenol as needed We also discussed morphine taper After completion of radiation treatment, I plan to repeat imaging study in September and to see her back to review test results The meantime, I will schedule flush appointment to keep her port patent  HIV (human immunodeficiency virus infection) ([place]) She is receiving antiretroviral treatment as directed by infectious disease team  Cancer associated pain We have extensive discussion about chronic pain management I am hopeful she can get herself off pain medicine after completion of treatment We discussed medication taper and narcotic refill policy  Pancytopenia, acquired ([place]) Overall, anemia is improving She does not need blood transfusion today   Orders Placed This Encounter  Procedures  . NM PET Image Restag (PS) Skull Base To Thigh    Standing Status:   Future    Standing Expiration Date:   04/12/2019    Order Specific Question:   If indicated for the ordered procedure, I authorize the administration of a radiopharmaceutical per Radiology protocol    Answer:   Yes    Order Specific Question:   Preferred imaging location?    Answer:   [facility]    Order Specific Question:   Radiology Contrast Protocol - do NOT remove file path    Answer:   [email or web address]    Order Specific Question:   Is the patient pregnant?    Answer:   No    INTERVAL HISTORY: Please see below for problem oriented charting. She returns for further follow-up She is tolerating radiation to therapy well She denies  vaginal bleeding No recent nausea or neuropathy She has mild constipation She is taking pain medicine sporadically but her pain is well controlled She has been prescribed antiretroviral treatment for her HIV infection  SUMMARY OF ONCOLOGIC HISTORY:   Cervical cancer ([place])   01/04/2017 Initial Diagnosis    She went to urgent care service for evaluation of abnormal vaginal symptoms with discharge and was diagnosed with bacterial vaginosis      01/03/2018 Imaging    1. Complex thick-walled fluid collection/abscess the lower pelvis with possible communication to the lower uterine/vagina. Clinical correlation and further evaluation with pelvic ultrasound recommended. CT with IV and delayed oral and rectal contrast may provide additional information. A smaller collection is also noted in the left posterior hemipelvis the left of the rectum. 2. No bowel obstruction or active inflammation.  Normal appendix. Please note the described 5.0 x 5.5 cm complex collection within the pelvis appears to be in the region of the cervix and may represent a necrotic mass/neoplasm. The described 3 x 3 cm low attenuating lesion to the left of the rectum may represent an abnormal lymph node.       01/04/2018 Pathology Results    Cervix, biopsy - POORLY DIFFERENTIATED CARCINOMA - SEE COMMENT Microscopic Comment The biopsy material consists of multiple fragments of tissue with infiltrative tumor and granulation tissue. By immunohistochemistry, the tumor is positive for cytokeratin 5/6, p63, p16 and vimentin (diffusely positive) but negative for ER. 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      01/04/2018 Imaging  Pelvic US 1. Heterogeneous area of irregularity with Doppler flow in the region of the cervix. A cervical mass is not excluded. Clinical correlation is recommended.  2. Rounded complex mass/collection posterior to the lower uterus/vagina corresponding to  the larger complex collection seen on the earlier CT. This may represent a necrotic or purulent collection. 3. Grossly unremarkable left ovary. Nonvisualization the right ovary.      01/04/2018 Procedure    She underwent examination under anesthesia Procedure: pt taken to the operating room where gen anesthesia was performed. She was placed in the dorsal lithotomy position and prepped and draped in the usual sterile fashion. A bivalve speculum was placed in the pts vagina and a macerated cervix was noted. There was very little that resembled a normal cervix. The tissue was necrotic and malodorous.  Several biopsies were obtained. There was actually very little bleeding from the biopsy sites although there was bleeding from within the uterine cavity above the location of the cervix.         01/22/2018 PET scan    1. Large hypermetabolic cervical mass with local invasion into the perirectal space. Bilateral pelvic adenopathy noted with small but hypermetabolic pelvic lymph nodes compatible with malignancy. I do not see definite hypermetabolic adenopathy in the upper abdomen, but there are scattered small bilateral hypermetabolic lymph nodes in the neck and axillary regions of uncertain significance. Given the low level activity and the skipped region in the abdomen, it is possible that the neck and chest lymph nodes are simply reactive, as usually I would expect to see more upper abdominal adenopathy were there involvement in the neck and chest. Surveillance is probably warranted. 2. Bilateral mild diffuse thyroid activity suggesting thyroiditis. 3. Other imaging findings of potential clinical significance: Aortic Atherosclerosis (ICD10-I70.0). Mild cardiomegaly. Bilateral iliac artery atherosclerosis. Suspected uterine fundal fibroid.       01/25/2018 Cancer Staging    Staging form: Cervix Uteri, AJCC 8th Edition - Clinical: FIGO Stage IVA (cT4, cN1, cM0) - Signed by [name], MD on 01/25/2018       02/01/2018 Procedure    Successful 8 French right internal jugular vein power port placement with its tip at the SVC/RA junction.      02/09/2018 -  Chemotherapy    The patient had weekly cisplatin       REVIEW OF SYSTEMS:   Constitutional: Denies fevers, chills or abnormal weight loss Eyes: Denies blurriness of vision Ears, nose, mouth, throat, and face: Denies mucositis or sore throat Respiratory: Denies cough, dyspnea or wheezes Cardiovascular: Denies palpitation, chest discomfort or lower extremity swelling Gastrointestinal:  Denies nausea, heartburn or change in bowel habits Skin: Denies abnormal skin rashes Lymphatics: Denies new lymphadenopathy or easy bruising Neurological:Denies numbness, tingling or new weaknesses Behavioral/Psych: Mood is stable, no new changes  All other systems were reviewed with the patient and are negative.  I have reviewed the past medical history, past surgical history, social history and family history with the patient and they are unchanged from previous note.  ALLERGIES:  has No Known Allergies.  MEDICATIONS:  Current Outpatient Medications  Medication Sig Dispense Refill  . bictegravir-emtricitabine-tenofovir AF (BIKTARVY) 50-200-25 MG TABS tablet Take 1 tablet by mouth daily. (Patient taking differently: Take 1 tablet by mouth every evening. ) 30 tablet 2  . docusate sodium (COLACE) 100 MG capsule Take 100 mg by mouth every morning.     Marland Kitchen levothyroxine (SYNTHROID, LEVOTHROID) 25 MCG tablet Take 1 tablet (25 mcg total) by mouth daily  before breakfast. (Patient taking differently: Take 25 mcg by mouth daily before breakfast. ) 30 tablet 1  . magnesium oxide (MAG-OX) 400 (241.3 Mg) MG tablet Take 1 tablet (400 mg total) by mouth 2 (two) times daily. 60 tablet 3  . Misc Natural Products (ESTROVEN + ENERGY MAX STRENGTH PO) Take 1 tablet by mouth daily.    Marland Kitchen morphine (MSIR) 15 MG tablet Take 1 tablet (15 mg total) by mouth every 4 (four) hours as  needed for severe pain. 60 tablet 0  . ondansetron (ZOFRAN) 4 MG tablet Take 1 tablet (4 mg total) by mouth every 8 (eight) hours as needed for nausea or vomiting. 20 tablet 0  . sulfamethoxazole-trimethoprim (BACTRIM) 400-80 MG tablet Take 1 tablet by mouth daily. 30 tablet 1   No current facility-administered medications for this visit.     PHYSICAL EXAMINATION: ECOG PERFORMANCE STATUS: 1 - Symptomatic but completely ambulatory  Vitals:   04/11/18 1022  BP: 103/60  Pulse: 89  Resp: 18  Temp: 98.5 F (36.9 C)  SpO2: 100%   Filed Weights   04/11/18 1022  Weight: 212 lb 1.6 oz (96.2 kg)    GENERAL:alert, no distress and comfortable NEURO: alert & oriented x 3 with fluent speech, no focal motor/sensory deficits  LABORATORY DATA:  I have reviewed the data as listed    Component Value Date/Time   NA 136 September 20, 202019 0902   K 4.2 September 20, 202019 0902   CL 102 September 20, 202019 0902   CO2 27 September 20, 202019 0902   GLUCOSE 159 (H) September 20, 202019 0902   BUN 18 September 20, 202019 0902   CREATININE 0.98 September 20, 202019 0902   CREATININE 1.21 (H) 02/19/2018 1616   CALCIUM 8.9 September 20, 202019 0902   PROT 7.6 September 20, 202019 0902   ALBUMIN 3.4 (L) September 20, 202019 0902   AST 17 September 20, 202019 0902   ALT 16 September 20, 202019 0902   ALKPHOS 78 September 20, 202019 0902   BILITOT <0.2 (L) September 20, 202019 0902   GFRNONAA >60 September 20, 202019 0902   GFRNONAA 52 (L) 02/19/2018 1616   GFRAA >60 September 20, 202019 0902   GFRAA 60 02/19/2018 1616    No results found for: SPEP, UPEP  Lab Results  Component Value Date   WBC 3.9 04/11/2018   NEUTROABS 2.1 04/11/2018   HGB 9.2 (L) 04/11/2018   HCT 28.6 (L) 04/11/2018   MCV 93.2 04/11/2018   PLT 330 04/11/2018      Chemistry      Component Value Date/Time   NA 136 September 20, 202019 0902   K 4.2 September 20, 202019 0902   CL 102 September 20, 202019 0902   CO2 27 September 20, 202019 0902   BUN 18 September 20, 202019 0902   CREATININE 0.98 September 20, 202019 0902   CREATININE 1.21 (H) 02/19/2018 1616      Component Value Date/Time   CALCIUM 8.9 September 20, 202019 0902    ALKPHOS 78 September 20, 202019 0902   AST 17 September 20, 202019 0902   ALT 16 September 20, 202019 0902   BILITOT <0.2 (L) September 20, 202019 0902       RADIOGRAPHIC STUDIES: I have personally reviewed the radiological images as listed and agreed with the findings in the report. [name] Intraoperative  Result Date: 04/04/2018 CLINICAL DATA:  Ultrasound was provided for use by the ordering physician, and a technical charge was applied by the performing facility.  No radiologist interpretation/professional services rendered.   [name] Intraoperative  Result Date: 03/26/2018 CLINICAL DATA:  Ultrasound was provided for use by the ordering physician, and a technical charge was applied by the performing facility.  No radiologist interpretation/professional services rendered.   [name] Intraoperative  Result  Date: 03/20/2018 CLINICAL DATA:  Ultrasound was provided for use by the ordering physician, and a technical charge was applied by the performing facility.  No radiologist interpretation/professional services rendered.    All questions were answered. The patient knows to call the clinic with any problems, questions or concerns. No barriers to learning was detected.  I spent 15 minutes counseling the patient face to face. The total time spent in the appointment was 20 minutes and more than 50% was on counseling and review of test results  [name], MD 04/11/2018 10:46 AM

## 2018-04-12 ENCOUNTER — Encounter (HOSPITAL_BASED_OUTPATIENT_CLINIC_OR_DEPARTMENT_OTHER): Payer: Self-pay | Admitting: *Deleted

## 2018-04-12 ENCOUNTER — Ambulatory Visit (HOSPITAL_BASED_OUTPATIENT_CLINIC_OR_DEPARTMENT_OTHER)
Admission: RE | Admit: 2018-04-12 | Discharge: 2018-04-12 | Disposition: A | Discharge status: Home / Self Care | Payer: BLUE CROSS/BLUE SHIELD | Source: Ambulatory Visit | Attending: Radiation Oncology | Admitting: Radiation Oncology

## 2018-04-12 ENCOUNTER — Ambulatory Visit
Admission: RE | Admit: 2018-04-12 | Discharge: 2018-04-12 | Disposition: A | Discharge status: Home / Self Care | Payer: BLUE CROSS/BLUE SHIELD | Source: Ambulatory Visit | Attending: Radiation Oncology | Admitting: Radiation Oncology

## 2018-04-12 ENCOUNTER — Ambulatory Visit (HOSPITAL_BASED_OUTPATIENT_CLINIC_OR_DEPARTMENT_OTHER): Payer: BLUE CROSS/BLUE SHIELD | Admitting: Anesthesiology

## 2018-04-12 ENCOUNTER — Encounter (HOSPITAL_BASED_OUTPATIENT_CLINIC_OR_DEPARTMENT_OTHER)
Admission: RE | Disposition: A | Discharge status: Home / Self Care | Payer: Self-pay | Source: Ambulatory Visit | Attending: Radiation Oncology

## 2018-04-12 ENCOUNTER — Other Ambulatory Visit: Payer: Self-pay

## 2018-04-12 ENCOUNTER — Ambulatory Visit (HOSPITAL_COMMUNITY)
Admission: RE | Admit: 2018-04-12 | Discharge: 2018-04-12 | Disposition: A | Discharge status: Home / Self Care | Payer: BLUE CROSS/BLUE SHIELD | Source: Ambulatory Visit | Attending: Radiation Oncology | Admitting: Radiation Oncology

## 2018-04-12 VITALS — BP 123/70 | HR 67 | Temp 98.3°F | Resp 20

## 2018-04-12 DIAGNOSIS — C539 Malignant neoplasm of cervix uteri, unspecified: Secondary | ICD-10-CM

## 2018-04-12 DIAGNOSIS — C530 Malignant neoplasm of endocervix: Secondary | ICD-10-CM

## 2018-04-12 DIAGNOSIS — Z510 Encounter for antineoplastic radiation therapy: Secondary | ICD-10-CM | POA: Insufficient documentation

## 2018-04-12 DIAGNOSIS — E119 Type 2 diabetes mellitus without complications: Secondary | ICD-10-CM | POA: Insufficient documentation

## 2018-04-12 DIAGNOSIS — D259 Leiomyoma of uterus, unspecified: Secondary | ICD-10-CM | POA: Insufficient documentation

## 2018-04-12 DIAGNOSIS — Z7984 Long term (current) use of oral hypoglycemic drugs: Secondary | ICD-10-CM | POA: Insufficient documentation

## 2018-04-12 DIAGNOSIS — Z79899 Other long term (current) drug therapy: Secondary | ICD-10-CM | POA: Insufficient documentation

## 2018-04-12 HISTORY — PX: TANDEM RING INSERTION: SHX6199

## 2018-04-12 LAB — POCT I-STAT 4, (NA,K, GLUC, HGB,HCT)
GLUCOSE: 167 mg/dL — AB (ref 65–99)
HCT: 31 % — ABNORMAL LOW (ref 36.0–46.0)
Hemoglobin: 10.5 g/dL — ABNORMAL LOW (ref 12.0–15.0)
POTASSIUM: 4.4 mmol/L (ref 3.5–5.1)
Sodium: 141 mmol/L (ref 135–145)

## 2018-04-12 LAB — GLUCOSE, CAPILLARY: GLUCOSE-CAPILLARY: 141 mg/dL — AB (ref 65–99)

## 2018-04-12 SURGERY — INSERTION, UTERINE TANDEM AND RING OR CYLINDER, FOR BRACHYTHERAPY
Anesthesia: General | Site: Vagina

## 2018-04-12 MED ORDER — HYDROMORPHONE HCL 1 MG/ML IJ SOLN
INTRAMUSCULAR | Status: AC
  Filled 2018-04-12: qty 1

## 2018-04-12 MED ORDER — KETOROLAC TROMETHAMINE 15 MG/ML IJ SOLN
INTRAMUSCULAR | Status: DC | PRN
  Administered 2018-04-12: 15 mg via INTRAMUSCULAR
  Administered 2018-04-12: 15 mg via INTRAVENOUS

## 2018-04-12 MED ORDER — ONDANSETRON HCL 4 MG/2ML IJ SOLN
INTRAMUSCULAR | Status: AC
  Filled 2018-04-12: qty 2

## 2018-04-12 MED ORDER — MIDAZOLAM HCL 5 MG/5ML IJ SOLN
INTRAMUSCULAR | Status: DC | PRN
  Administered 2018-04-12: 2 mg via INTRAVENOUS

## 2018-04-12 MED ORDER — PROPOFOL 10 MG/ML IV BOLUS
INTRAVENOUS | Status: DC | PRN
  Administered 2018-04-12: 200 mg via INTRAVENOUS

## 2018-04-12 MED ORDER — ONDANSETRON HCL 4 MG/2ML IJ SOLN
INTRAMUSCULAR | Status: DC | PRN
  Administered 2018-04-12: 4 mg via INTRAVENOUS

## 2018-04-12 MED ORDER — KETOROLAC TROMETHAMINE 30 MG/ML IJ SOLN
INTRAMUSCULAR | Status: AC
  Filled 2018-04-12: qty 2

## 2018-04-12 MED ORDER — HYDROMORPHONE HCL 1 MG/ML IJ SOLN
0.5000 mg | Freq: Once | INTRAMUSCULAR | Status: AC
  Administered 2018-04-12: 0.5 mg via INTRAVENOUS
  Filled 2018-04-12: qty 1

## 2018-04-12 MED ORDER — MIDAZOLAM HCL 2 MG/2ML IJ SOLN
INTRAMUSCULAR | Status: AC
  Filled 2018-04-12: qty 2

## 2018-04-12 MED ORDER — SODIUM CHLORIDE 0.9 % IR SOLN
Status: DC | PRN
  Administered 2018-04-12: 1000 mL

## 2018-04-12 MED ORDER — LACTATED RINGERS IV SOLN
INTRAVENOUS | Status: DC
  Administered 2018-04-12: 12:00:00 via INTRAVENOUS
  Filled 2018-04-12 (×2): qty 250

## 2018-04-12 MED ORDER — DEXAMETHASONE SODIUM PHOSPHATE 10 MG/ML IJ SOLN
INTRAMUSCULAR | Status: AC
  Filled 2018-04-12: qty 1

## 2018-04-12 MED ORDER — MEPERIDINE HCL 25 MG/ML IJ SOLN
6.2500 mg | INTRAMUSCULAR | Status: DC | PRN
  Filled 2018-04-12: qty 1

## 2018-04-12 MED ORDER — ONDANSETRON HCL 4 MG/2ML IJ SOLN
4.0000 mg | Freq: Once | INTRAMUSCULAR | Status: DC | PRN
  Filled 2018-04-12: qty 2

## 2018-04-12 MED ORDER — LIDOCAINE 2% (20 MG/ML) 5 ML SYRINGE
INTRAMUSCULAR | Status: DC | PRN
  Administered 2018-04-12: 100 mg via INTRAVENOUS

## 2018-04-12 MED ORDER — HYDROMORPHONE HCL 1 MG/ML IJ SOLN
0.2500 mg | INTRAMUSCULAR | Status: DC | PRN
  Administered 2018-04-12 (×2): 0.25 mg via INTRAVENOUS
  Administered 2018-04-12: 0.5 mg via INTRAVENOUS
  Administered 2018-04-12: 0.25 mg via INTRAVENOUS
  Administered 2018-04-12: 0.5 mg via INTRAVENOUS
  Filled 2018-04-12: qty 0.5

## 2018-04-12 MED ORDER — LIDOCAINE 2% (20 MG/ML) 5 ML SYRINGE
INTRAMUSCULAR | Status: AC
  Filled 2018-04-12: qty 5

## 2018-04-12 MED ORDER — ARTIFICIAL TEARS OPHTHALMIC OINT
TOPICAL_OINTMENT | OPHTHALMIC | Status: AC
  Filled 2018-04-12: qty 3.5

## 2018-04-12 MED ORDER — HYDROMORPHONE HCL 2 MG/ML IJ SOLN
INTRAMUSCULAR | Status: AC
  Filled 2018-04-12: qty 1

## 2018-04-12 MED ORDER — ESTRADIOL 0.1 MG/GM VA CREA
TOPICAL_CREAM | VAGINAL | Status: DC | PRN
  Administered 2018-04-12: 1 via VAGINAL

## 2018-04-12 MED ORDER — FENTANYL CITRATE (PF) 100 MCG/2ML IJ SOLN
INTRAMUSCULAR | Status: AC
  Filled 2018-04-12: qty 2

## 2018-04-12 MED ORDER — LACTATED RINGERS IV SOLN
INTRAVENOUS | Status: DC
  Administered 2018-04-12: 06:00:00 via INTRAVENOUS
  Administered 2018-04-12: 1000 mL via INTRAVENOUS
  Filled 2018-04-12 (×2): qty 1000

## 2018-04-12 MED ORDER — FENTANYL CITRATE (PF) 100 MCG/2ML IJ SOLN
INTRAMUSCULAR | Status: DC | PRN
  Administered 2018-04-12 (×2): 50 ug via INTRAVENOUS

## 2018-04-12 MED ORDER — PROPOFOL 10 MG/ML IV BOLUS
INTRAVENOUS | Status: AC
  Filled 2018-04-12: qty 40

## 2018-04-12 MED ORDER — DEXAMETHASONE SODIUM PHOSPHATE 4 MG/ML IJ SOLN
INTRAMUSCULAR | Status: DC | PRN
  Administered 2018-04-12: 10 mg via INTRAVENOUS

## 2018-04-12 SURGICAL SUPPLY — 24 items
BAG URINE DRAINAGE (UROLOGICAL SUPPLIES) ×3 IMPLANT
BNDG CONFORM 2 STRL LF (GAUZE/BANDAGES/DRESSINGS) IMPLANT
CATH FOLEY 2WAY SLVR  5CC 16FR (CATHETERS) ×2
CATH FOLEY 2WAY SLVR 5CC 16FR (CATHETERS) ×1 IMPLANT
DILATOR CANAL MILEX (MISCELLANEOUS) IMPLANT
GLOVE BIO SURGEON STRL SZ7.5 (GLOVE) ×8 IMPLANT
GOWN STRL REUS W/TWL LRG LVL3 (GOWN DISPOSABLE) ×5 IMPLANT
HOLDER FOLEY CATH W/STRAP (MISCELLANEOUS) ×3 IMPLANT
HOVERMATT SINGLE USE (MISCELLANEOUS) ×3 IMPLANT
IV NS 1000ML (IV SOLUTION) ×3
IV NS 1000ML BAXH (IV SOLUTION) ×1 IMPLANT
KIT TURNOVER CYSTO (KITS) ×3 IMPLANT
NDL SPNL 22GX3.5 QUINCKE BK (NEEDLE) IMPLANT
NEEDLE SPNL 22GX3.5 QUINCKE BK (NEEDLE) IMPLANT
PACK VAGINAL MINOR WOMEN LF (CUSTOM PROCEDURE TRAY) ×3 IMPLANT
PACKING VAGINAL (PACKING) IMPLANT
PAD ABD 8X10 STRL (GAUZE/BANDAGES/DRESSINGS) ×3 IMPLANT
PAD OB MATERNITY 4.3X12.25 (PERSONAL CARE ITEMS) IMPLANT
PLUG CATH AND CAP STER (CATHETERS) IMPLANT
SET IRRIG Y TYPE TUR BLADDER L (SET/KITS/TRAYS/PACK) ×3 IMPLANT
TOWEL OR 17X24 6PK STRL BLUE (TOWEL DISPOSABLE) ×6 IMPLANT
TUBE CONNECTING 12'X1/4 (SUCTIONS)
TUBE CONNECTING 12X1/4 (SUCTIONS) IMPLANT
WATER STERILE IRR 500ML POUR (IV SOLUTION) ×3 IMPLANT

## 2018-04-12 NOTE — Transfer of Care (Signed)
  Last Vitals:  Vitals Value Taken Time  BP 118/76 04/12/2018  8:18 AM  Temp    Pulse 81 04/12/2018  8:22 AM  Resp 12 04/12/2018  8:22 AM  SpO2 100 % 04/12/2018  8:22 AM  Vitals shown include unvalidated device data.  Last Pain:  Vitals:   04/12/18 0553  TempSrc: Oral  PainSc: 0-No pain      Patients Stated Pain Goal: 4 (04/12/18 3474)  Immediate Anesthesia Transfer of Care Note  Patient: Misty Thomas  Procedure(s) Performed: Procedure(s) (LRB): TANDEM RING INSERTION (N/A)  Patient Location: PACU  Anesthesia Type: General  Level of Consciousness: awake, alert  and oriented  Airway & Oxygen Therapy: Patient Spontanous Breathing and Patient connected to nasal cannula oxygen  Post-op Assessment: Report given to PACU RN and Post -op Vital signs reviewed and stable  Post vital signs: Reviewed and stable  Complications: No apparent anesthesia complications

## 2018-04-12 NOTE — Anesthesia Preprocedure Evaluation (Signed)
Anesthesia Evaluation  Patient identified by MRN, date of birth, ID band Patient awake    Reviewed: Allergy & Precautions, NPO status , Patient's Chart, lab work & pertinent test results  Airway Mallampati: II  TM Distance: >3 FB Neck ROM: Full    Dental   Pulmonary    Pulmonary exam normal        Cardiovascular Normal cardiovascular exam     Neuro/Psych    GI/Hepatic   Endo/Other  diabetes, Type 2, Oral Hypoglycemic Agents  Renal/GU      Musculoskeletal   Abdominal   Peds  Hematology   Anesthesia Other Findings   Reproductive/Obstetrics                             Anesthesia Physical Anesthesia Plan  ASA: III  Anesthesia Plan: General   Post-op Pain Management:    Induction: Intravenous  PONV Risk Score and Plan: 3 and Ondansetron, Midazolam and Treatment may vary due to age or medical condition  Airway Management Planned: LMA  Additional Equipment:   Intra-op Plan:   Post-operative Plan: Extubation in OR  Informed Consent: I have reviewed the patients History and Physical, chart, labs and discussed the procedure including the risks, benefits and alternatives for the proposed anesthesia with the patient or authorized representative who has indicated his/her understanding and acceptance.     Plan Discussed with: CRNA and Surgeon  Anesthesia Plan Comments:         Anesthesia Quick Evaluation

## 2018-04-12 NOTE — Op Note (Signed)
04/12/2018  8:50 AM  PATIENT:  Misty Thomas  52 y.o. female  PRE-OPERATIVE DIAGNOSIS:  ENDOCERVIX CANCER  POST-OPERATIVE DIAGNOSIS:  ENDOCERVIX CANCER  PROCEDURE:  Procedure(s): TANDEM RING INSERTION (N/A)  SURGEON:  Surgeon(s) and Role:    * Gery Pray, MD - Primary  PHYSICIAN ASSISTANT:   ASSISTANTS: none   ANESTHESIA:   general  EBL:  5 mL   BLOOD ADMINISTERED:none  DRAINS: Urinary Catheter (Foley)   LOCAL MEDICATIONS USED:  NONE  SPECIMEN:  No Specimen  DISPOSITION OF SPECIMEN:  N/A  COUNTS:  YES  TOURNIQUET:  * No tourniquets in log *  DICTATION: patient transported to the [place], outpatient facility. She was prepped and draped in the usual sterile fashion and placed in the dorsal lithotomy position. A timeout for the procedure, estimated time of the procedure, preoperative Allergies and antibiotics was reviewed. The patient had placement of a Foley catheter without difficulty. The bladder was then back filled with approximately 250 mL of sterile water. Patient proceeded to undergo intraoperative ultrasound through transabdominal approach. Excellent images were obtained with the new ultrasound equipment despite the patient's body habitus.  The patient continues to have necrotic tumor at the cervical os. There is no obvious parametrial extension at this time although exam somewhat difficult given the patient's weight issues. On bimanual and rectovaginal examination the patient was noted to have a prominent left perirectal lymph node which was estimated to be approximately 2 x 3 cm in size. This was noted on initial imaging studies. This node was covered by her external beam radiation fields.There was no rectal mucosal involvement by this large perirectal node. On intraoperative ultrasound images the patient was noted to have a large uterine fibroid which is located primarily along the right side of the uterus and anterior.Cervical dilation and sounding was much easier  this week compared to week 1, suggesting some shrinkage of the endocervical component of the lesion. The uterus sounded to approximately 7.5 cm. Patient proceeded to undergo serial dilation of the cervical os. A 60 mm cervical sleeve was placed in the endocervical and endometrial canal canal. Excellent ultrasound images were obtained. .I was able to place of a 60 60 mm tandem with ultrasound guidance. This did show good placement within the endometrial canal.The patient then had a60 degreering with a small shielding capattached to the tandem followed by a rectal paddle. The patient had vaginal packing soaked in Estrace cream placed in the vaginal vault to limit dose to the proximal vaginal mucosa. Patient tolerated the procedure well and she was subsequently transported to the recovery room in stable condition. Later in the day the patient will be transferred to radiation oncology for planning and her fourth high-dose-rate treatment. She will receive 1additional high-dose rate treatments after today which will complete her definitive course of therapy.    PLAN OF CARE: transferred to radiation oncology for planning and treatment  PATIENT DISPOSITION:  PACU - hemodynamically stable.   Delay start of Pharmacological VTE agent (>24hrs) due to surgical blood loss or risk of bleeding: na

## 2018-04-12 NOTE — Anesthesia Procedure Notes (Signed)
Procedure Name: LMA Insertion Date/Time: 04/12/2018 7:33 AM Performed by: Lillia Abed, MD Pre-anesthesia Checklist: Patient identified, Emergency Drugs available, Suction available and Patient being monitored Patient Re-evaluated:Patient Re-evaluated prior to induction Oxygen Delivery Method: Circle system utilized Preoxygenation: Pre-oxygenation with 100% oxygen Induction Type: IV induction Ventilation: Mask ventilation without difficulty LMA: LMA inserted LMA Size: 4.0 Number of attempts: 1 Airway Equipment and Method: Bite block Placement Confirmation: positive ETCO2 Tube secured with: Tape Dental Injury: Teeth and Oropharynx as per pre-operative assessment

## 2018-04-12 NOTE — Progress Notes (Signed)
Patient was given discharge papers. Verbalized understanding. IV was removed cather was intact.

## 2018-04-12 NOTE — Interval H&P Note (Signed)
History and Physical Interval Note:  04/12/2018 7:23 AM  [name]  has presented today for surgery, with the diagnosis of ENDOCERVIX CANCER  The various methods of treatment have been discussed with the patient and family. After consideration of risks, benefits and other options for treatment, the patient has consented to  Procedure(s): TANDEM RING INSERTION (N/A) as a surgical intervention .  The patient's history has been reviewed, patient examined, no change in status, stable for surgery.  I have reviewed the patient's chart and labs.  Questions were answered to the patient's satisfaction.     Gery Pray

## 2018-04-12 NOTE — Anesthesia Postprocedure Evaluation (Signed)
Anesthesia Post Note  Patient: MARGEE TRENTHAM  Procedure(s) Performed: TANDEM RING INSERTION (N/A Vagina )     Patient location during evaluation: PACU Anesthesia Type: General Level of consciousness: awake and alert Pain management: pain level controlled Vital Signs Assessment: post-procedure vital signs reviewed and stable Respiratory status: spontaneous breathing, nonlabored ventilation, respiratory function stable and patient connected to nasal cannula oxygen Cardiovascular status: blood pressure returned to baseline and stable Postop Assessment: no apparent nausea or vomiting Anesthetic complications: no    Last Vitals:  Vitals:   04/12/18 0919 04/12/18 0930  BP:    Pulse: 71 73  Resp: 11 10  Temp:    SpO2: 100% 94%    Last Pain:  Vitals:   04/12/18 0930  TempSrc:   PainSc: 2                  Hussien Greenblatt DAVID

## 2018-04-12 NOTE — Patient Instructions (Signed)
IMMEDIATELY FOLLOWING SURGERY: Do not drive or operate machinery for the first twenty four hours after surgery. Do not make any important decisions for twenty four hours after surgery or while taking narcotic pain medications or sedatives. If you develop intractable nausea and vomiting or a severe headache please notify your doctor immediately.   FOLLOW-UP: You do not need to follow up with anesthesia unless specifically instructed to do so.   WOUND CARE INSTRUCTIONS (if applicable): Expect some mild vaginal bleeding, but if large amount of bleeding occurs please contact Dr. Sondra Come at [phone] or the Radiation On-Call physician. Call for any fever greater than 101.0 degrees or increasing vaginal//abdominal pain or trouble urinating.   QUESTIONS?: Please feel free to call your physician or the hospital operator if you have any questions, and they will be happy to assist you. Resume all medications: as listed on your after visit summary. Your next appointment is:  Future Appointments  Date Time Provider [place]  04/12/2018 12:30 PM Gery Pray, MD [facility] None  04/18/2018  8:30 AM [facility] PORT 1 [facility]   04/18/2018 11:00 AM Gery Pray, MD [facility] None  04/18/2018  2:00 PM Gery Pray, MD [facility] None  05/08/2018  3:00 PM [facility] FLUSH NURSE [facility] None  06/19/2018  3:00 PM [facility] FLUSH NURSE [facility] None  07/20/2018  3:00 PM [name], MD [facility] None  07/31/2018  3:00 PM [facility] FLUSH NURSE [facility] None

## 2018-04-12 NOTE — Addendum Note (Signed)
Encounter addended by: Sherrlyn Hock, LPN on: 06/08/4174 3:01 PM  Actions taken: Sign clinical note

## 2018-04-12 NOTE — Addendum Note (Signed)
Encounter addended by: Sherrlyn Hock, LPN on: 8/0/2233 6:12 PM  Actions taken: LDA properties accepted

## 2018-04-12 NOTE — Progress Notes (Signed)
  Radiation Oncology         [phone] ________________________________  Name: Misty Thomas MRN: [id number]  Date: 04/12/2018  DOB: 1966/01/28  CC: Default, Provider, MD  Everitt Amber, MD  HDR BRACHYTHERAPY NOTE  DIAGNOSIS: Malignant neoplasm of endocervix [facility])  NARRATIVE: The patient was brought to the [place] suite. Identity was confirmed. All relevant records and images related to the planned course of therapy were reviewed. The patient freely provided informed written consent to proceed with treatment after reviewing the details related to the planned course of therapy. The consent form was witnessed and verified by the simulation staff. Then, the patient was set-up in a stable reproducible supine position for radiation therapy. The tandem ring system was accessed and fiducial markers were placed within the tandem and ring.   Simple treatment device note: On the operating room the patient had construction of her custom tandem ring system. She will be treated with a 60 tandem/ring system. The patient had placement of a 60 mm tandem. A cervical ring with a small shielding was used for her treatment. A rectal paddle was also part of her custom set up device. Vaginal packing was also used for this procedure.  Verification simulation note: An AP and lateral film was obtained through the pelvis area. This was compared to the patient's planning films documenting accurate position of the tandem/ring system for treatment.  High-dose-rate brachytherapy treatment note:  The remote afterloading device was accessed through catheter system and attached to the tandem ring system. Patient then proceeded to undergo her 4th high-dose-rate treatment directed at the cervix. The patient was prescribed a dose of 5.5 gray to be delivered to the [place]. Patient was treated with 2 channels using 26 dwell positions. Treatment time was 688.4 seconds. The patient tolerated the procedure well. After completion of her  therapy, a radiation survey was performed documenting return of the iridium source into the Gamma[employer] safe. The patient was then transferred to the nursing suite. She then had removal of the rectal paddle followed by the tandem and ring system. The patient tolerated the removal well.  PLAN: Patient will return next week for her 5th and final treatment. ________________________________  Blair Promise, PhD, MD  This document serves as a record of services personally performed by Gery Pray, MD. It was created on his behalf by Wilburn Mylar, a trained medical scribe. The creation of this record is based on the scribe's personal observations and the provider's statements to them. This document has been checked and approved by the attending provider.

## 2018-04-12 NOTE — Progress Notes (Signed)
  Radiation Oncology         [phone] ________________________________  Name: Misty Thomas MRN: [id number]  Date: 04/12/2018  DOB: 12-20-1965  SIMULATION AND TREATMENT PLANNING NOTE HDR BRACHYTHERAPY  DIAGNOSIS:  Malignant neoplasm of endocervix ([place]).Stage IIIB, squamous cell carcinoma of the cervix  NARRATIVE:  The patient was brought to the [place].  Identity was confirmed.  All relevant records and images related to the planned course of therapy were reviewed.  The patient freely provided informed written consent to proceed with treatment after reviewing the details related to the planned course of therapy. The consent form was witnessed and verified by the simulation staff.  Then, the patient was set-up in a stable reproducible supine position for radiation therapy.  CT images were obtained.  Surface markings were placed.  The CT images were loaded into the planning software.  Then the target and avoidance structures were contoured.  Treatment planning then occurred.  The radiation prescription was entered and confirmed.   I have requested : Brachytherapy Isodose Plan and Dosimetry Calculations to plan the radiation distribution.    PLAN:  The patient will receive 5.5 Gy in 1 fraction directed at the [facility].   -----------------------------------  Blair Promise, PhD, MD  This document serves as a record of services personally performed by Gery Pray, MD. It was created on his behalf by Wilburn Mylar, a trained medical scribe. The creation of this record is based on the scribe's personal observations and the provider's statements to them. This document has been checked and approved by the attending provider.

## 2018-04-13 ENCOUNTER — Encounter (HOSPITAL_BASED_OUTPATIENT_CLINIC_OR_DEPARTMENT_OTHER): Payer: Self-pay | Admitting: Radiation Oncology

## 2018-04-13 ENCOUNTER — Other Ambulatory Visit: Payer: Self-pay

## 2018-04-13 NOTE — Progress Notes (Signed)
Spoke w/ pt via phone for pre-op interview.  Npo after mn.  Arrive at [employer] [facility].  Getting lab work done Tuesday 04-17-2018 @ 1400 (CBCdiff, BMET).  Will take colace, synthroid, and magnesium am dos w/ sips of water.  This will be pt's last tandem procedure for high dose radiation.  Congratulated pt :)

## 2018-04-17 ENCOUNTER — Encounter (HOSPITAL_COMMUNITY)
Admit: 2018-04-17 | Discharge: 2018-04-17 | Disposition: A | Discharge status: Home / Self Care | Payer: BLUE CROSS/BLUE SHIELD | Attending: Hematology and Oncology | Admitting: Hematology and Oncology

## 2018-04-17 DIAGNOSIS — E039 Hypothyroidism, unspecified: Secondary | ICD-10-CM | POA: Diagnosis not present

## 2018-04-17 DIAGNOSIS — Z9221 Personal history of antineoplastic chemotherapy: Secondary | ICD-10-CM | POA: Diagnosis not present

## 2018-04-17 DIAGNOSIS — E119 Type 2 diabetes mellitus without complications: Secondary | ICD-10-CM | POA: Diagnosis not present

## 2018-04-17 DIAGNOSIS — G893 Neoplasm related pain (acute) (chronic): Secondary | ICD-10-CM | POA: Diagnosis not present

## 2018-04-17 DIAGNOSIS — Z79891 Long term (current) use of opiate analgesic: Secondary | ICD-10-CM | POA: Diagnosis not present

## 2018-04-17 DIAGNOSIS — Z79890 Hormone replacement therapy (postmenopausal): Secondary | ICD-10-CM | POA: Diagnosis not present

## 2018-04-17 DIAGNOSIS — C530 Malignant neoplasm of endocervix: Secondary | ICD-10-CM | POA: Diagnosis present

## 2018-04-17 DIAGNOSIS — Z7984 Long term (current) use of oral hypoglycemic drugs: Secondary | ICD-10-CM | POA: Diagnosis not present

## 2018-04-17 DIAGNOSIS — Z21 Asymptomatic human immunodeficiency virus [HIV] infection status: Secondary | ICD-10-CM | POA: Diagnosis not present

## 2018-04-17 DIAGNOSIS — Z79899 Other long term (current) drug therapy: Secondary | ICD-10-CM | POA: Diagnosis not present

## 2018-04-17 LAB — CBC WITH DIFFERENTIAL/PLATELET
Basophils Absolute: 0 10*3/uL (ref 0.0–0.1)
Basophils Relative: 0 %
EOS PCT: 0 %
Eosinophils Absolute: 0 10*3/uL (ref 0.0–0.7)
HEMATOCRIT: 30.8 % — AB (ref 36.0–46.0)
Hemoglobin: 10.1 g/dL — ABNORMAL LOW (ref 12.0–15.0)
Lymphocytes Relative: 20 %
Lymphs Abs: 0.8 10*3/uL (ref 0.7–4.0)
MCH: 30.8 pg (ref 26.0–34.0)
MCHC: 32.8 g/dL (ref 30.0–36.0)
MCV: 93.9 fL (ref 78.0–100.0)
MONO ABS: 0.4 10*3/uL (ref 0.1–1.0)
MONOS PCT: 9 %
NEUTROS ABS: 2.9 10*3/uL (ref 1.7–7.7)
Neutrophils Relative %: 71 %
PLATELETS: 377 10*3/uL (ref 150–400)
RBC: 3.28 MIL/uL — ABNORMAL LOW (ref 3.87–5.11)
RDW: 18.7 % — AB (ref 11.5–15.5)
WBC: 4.1 10*3/uL (ref 4.0–10.5)

## 2018-04-17 LAB — BASIC METABOLIC PANEL
Anion gap: 9 (ref 5–15)
BUN: 21 mg/dL — ABNORMAL HIGH (ref 6–20)
CALCIUM: 10.1 mg/dL (ref 8.9–10.3)
CO2: 24 mmol/L (ref 22–32)
CREATININE: 1.37 mg/dL — AB (ref 0.44–1.00)
Chloride: 106 mmol/L (ref 101–111)
GFR calc Af Amer: 50 mL/min — ABNORMAL LOW (ref >60)
GFR calc non Af Amer: 43 mL/min — ABNORMAL LOW (ref >60)
GLUCOSE: 160 mg/dL — AB (ref 65–99)
Potassium: 4.7 mmol/L (ref 3.5–5.1)
Sodium: 139 mmol/L (ref 135–145)

## 2018-04-18 ENCOUNTER — Encounter (HOSPITAL_BASED_OUTPATIENT_CLINIC_OR_DEPARTMENT_OTHER)
Admission: RE | Disposition: A | Discharge status: Home / Self Care | Payer: Self-pay | Source: Ambulatory Visit | Attending: Radiation Oncology

## 2018-04-18 ENCOUNTER — Ambulatory Visit
Admission: RE | Admit: 2018-04-18 | Discharge: 2018-04-18 | Disposition: A | Discharge status: Home / Self Care | Payer: BLUE CROSS/BLUE SHIELD | Source: Ambulatory Visit | Attending: Radiation Oncology | Admitting: Radiation Oncology

## 2018-04-18 ENCOUNTER — Encounter (HOSPITAL_BASED_OUTPATIENT_CLINIC_OR_DEPARTMENT_OTHER): Payer: Self-pay | Admitting: *Deleted

## 2018-04-18 ENCOUNTER — Ambulatory Visit (HOSPITAL_BASED_OUTPATIENT_CLINIC_OR_DEPARTMENT_OTHER): Payer: BLUE CROSS/BLUE SHIELD | Admitting: Certified Registered"

## 2018-04-18 ENCOUNTER — Ambulatory Visit (HOSPITAL_COMMUNITY)
Admission: RE | Admit: 2018-04-18 | Discharge: 2018-04-18 | Disposition: A | Discharge status: Home / Self Care | Payer: BLUE CROSS/BLUE SHIELD | Source: Ambulatory Visit | Attending: Radiation Oncology | Admitting: Radiation Oncology

## 2018-04-18 ENCOUNTER — Other Ambulatory Visit: Payer: Self-pay

## 2018-04-18 ENCOUNTER — Ambulatory Visit (HOSPITAL_BASED_OUTPATIENT_CLINIC_OR_DEPARTMENT_OTHER)
Admission: RE | Admit: 2018-04-18 | Discharge: 2018-04-18 | Disposition: A | Discharge status: Home / Self Care | Payer: BLUE CROSS/BLUE SHIELD | Source: Ambulatory Visit | Attending: Radiation Oncology | Admitting: Radiation Oncology

## 2018-04-18 VITALS — BP 111/57 | HR 62 | Temp 98.6°F | Resp 18

## 2018-04-18 DIAGNOSIS — E119 Type 2 diabetes mellitus without complications: Secondary | ICD-10-CM | POA: Insufficient documentation

## 2018-04-18 DIAGNOSIS — Z79891 Long term (current) use of opiate analgesic: Secondary | ICD-10-CM | POA: Insufficient documentation

## 2018-04-18 DIAGNOSIS — G893 Neoplasm related pain (acute) (chronic): Secondary | ICD-10-CM | POA: Insufficient documentation

## 2018-04-18 DIAGNOSIS — Z9221 Personal history of antineoplastic chemotherapy: Secondary | ICD-10-CM | POA: Insufficient documentation

## 2018-04-18 DIAGNOSIS — Z21 Asymptomatic human immunodeficiency virus [HIV] infection status: Secondary | ICD-10-CM | POA: Insufficient documentation

## 2018-04-18 DIAGNOSIS — C530 Malignant neoplasm of endocervix: Secondary | ICD-10-CM

## 2018-04-18 DIAGNOSIS — C539 Malignant neoplasm of cervix uteri, unspecified: Secondary | ICD-10-CM

## 2018-04-18 DIAGNOSIS — Z79899 Other long term (current) drug therapy: Secondary | ICD-10-CM | POA: Insufficient documentation

## 2018-04-18 DIAGNOSIS — Z7984 Long term (current) use of oral hypoglycemic drugs: Secondary | ICD-10-CM | POA: Insufficient documentation

## 2018-04-18 DIAGNOSIS — Z79890 Hormone replacement therapy (postmenopausal): Secondary | ICD-10-CM | POA: Insufficient documentation

## 2018-04-18 DIAGNOSIS — E039 Hypothyroidism, unspecified: Secondary | ICD-10-CM | POA: Insufficient documentation

## 2018-04-18 HISTORY — PX: TANDEM RING INSERTION: SHX6199

## 2018-04-18 LAB — GLUCOSE, CAPILLARY
GLUCOSE-CAPILLARY: 156 mg/dL — AB (ref 65–99)
GLUCOSE-CAPILLARY: 125 mg/dL — AB (ref 65–99)

## 2018-04-18 SURGERY — INSERTION, UTERINE TANDEM AND RING OR CYLINDER, FOR BRACHYTHERAPY
Anesthesia: General | Site: Cervix

## 2018-04-18 MED ORDER — HYDROMORPHONE HCL 1 MG/ML IJ SOLN
INTRAMUSCULAR | Status: AC
  Filled 2018-04-18: qty 1

## 2018-04-18 MED ORDER — ONDANSETRON HCL 4 MG/2ML IJ SOLN
INTRAMUSCULAR | Status: DC | PRN
  Administered 2018-04-18: 4 mg via INTRAVENOUS

## 2018-04-18 MED ORDER — OXYCODONE HCL 5 MG/5ML PO SOLN
5.0000 mg | Freq: Once | ORAL | Status: DC | PRN
  Filled 2018-04-18: qty 5

## 2018-04-18 MED ORDER — HYDROMORPHONE HCL 1 MG/ML IJ SOLN
0.5000 mg | Freq: Once | INTRAMUSCULAR | Status: AC
  Administered 2018-04-18: 0.5 mg via INTRAVENOUS
  Filled 2018-04-18: qty 1

## 2018-04-18 MED ORDER — MIDAZOLAM HCL 2 MG/2ML IJ SOLN
INTRAMUSCULAR | Status: AC
  Filled 2018-04-18: qty 2

## 2018-04-18 MED ORDER — LIDOCAINE 2% (20 MG/ML) 5 ML SYRINGE
INTRAMUSCULAR | Status: DC | PRN
  Administered 2018-04-18: 60 mg via INTRAVENOUS

## 2018-04-18 MED ORDER — ACETAMINOPHEN 500 MG PO TABS
1000.0000 mg | ORAL_TABLET | Freq: Once | ORAL | Status: AC
  Administered 2018-04-18: 1000 mg via ORAL
  Filled 2018-04-18: qty 2

## 2018-04-18 MED ORDER — PROPOFOL 10 MG/ML IV BOLUS
INTRAVENOUS | Status: AC
  Filled 2018-04-18: qty 20

## 2018-04-18 MED ORDER — HYDROMORPHONE HCL 1 MG/ML IJ SOLN
0.2500 mg | INTRAMUSCULAR | Status: DC | PRN
  Administered 2018-04-18 (×3): 0.25 mg via INTRAVENOUS
  Administered 2018-04-18 (×2): 0.5 mg via INTRAVENOUS
  Administered 2018-04-18: 0.25 mg via INTRAVENOUS
  Filled 2018-04-18: qty 0.5

## 2018-04-18 MED ORDER — ACETAMINOPHEN 500 MG PO TABS
ORAL_TABLET | ORAL | Status: AC
  Filled 2018-04-18: qty 2

## 2018-04-18 MED ORDER — PROMETHAZINE HCL 25 MG/ML IJ SOLN
6.2500 mg | INTRAMUSCULAR | Status: DC | PRN
  Filled 2018-04-18: qty 1

## 2018-04-18 MED ORDER — PROPOFOL 10 MG/ML IV BOLUS
INTRAVENOUS | Status: DC | PRN
  Administered 2018-04-18: 150 mg via INTRAVENOUS

## 2018-04-18 MED ORDER — LACTATED RINGERS IV SOLN
INTRAVENOUS | Status: DC
  Filled 2018-04-18: qty 1000

## 2018-04-18 MED ORDER — OXYCODONE HCL 5 MG PO TABS
5.0000 mg | ORAL_TABLET | Freq: Once | ORAL | Status: DC | PRN
Start: 2018-04-18 — End: 2018-04-20
  Filled 2018-04-18: qty 1

## 2018-04-18 MED ORDER — DEXAMETHASONE SODIUM PHOSPHATE 10 MG/ML IJ SOLN
INTRAMUSCULAR | Status: DC | PRN
  Administered 2018-04-18: 10 mg via INTRAVENOUS

## 2018-04-18 MED ORDER — FENTANYL CITRATE (PF) 100 MCG/2ML IJ SOLN
INTRAMUSCULAR | Status: DC | PRN
  Administered 2018-04-18 (×2): 50 ug via INTRAVENOUS

## 2018-04-18 MED ORDER — FENTANYL CITRATE (PF) 100 MCG/2ML IJ SOLN
INTRAMUSCULAR | Status: AC
  Filled 2018-04-18: qty 2

## 2018-04-18 MED ORDER — LACTATED RINGERS IV SOLN
INTRAVENOUS | Status: DC
  Administered 2018-04-18 (×2): via INTRAVENOUS
  Filled 2018-04-18: qty 1000

## 2018-04-18 MED ORDER — MIDAZOLAM HCL 2 MG/2ML IJ SOLN
INTRAMUSCULAR | Status: DC | PRN
  Administered 2018-04-18: 2 mg via INTRAVENOUS

## 2018-04-18 MED ORDER — MEPERIDINE HCL 25 MG/ML IJ SOLN
6.2500 mg | INTRAMUSCULAR | Status: DC | PRN
  Filled 2018-04-18: qty 1

## 2018-04-18 MED FILL — SULFAMETHOXAZOLE-TMP SS TAB: 400-80 | 30 days supply | Qty: 30 | Fill #1

## 2018-04-18 MED FILL — BIKTARVY 50-200-25 MG TABS: 50-200-25 | 30 days supply | Qty: 30 | Fill #1

## 2018-04-18 SURGICAL SUPPLY — 24 items
BAG URINE DRAINAGE (UROLOGICAL SUPPLIES) ×3 IMPLANT
BNDG CONFORM 2 STRL LF (GAUZE/BANDAGES/DRESSINGS) IMPLANT
CATH FOLEY 2WAY SLVR  5CC 16FR (CATHETERS) ×2
CATH FOLEY 2WAY SLVR 5CC 16FR (CATHETERS) ×1 IMPLANT
DILATOR CANAL MILEX (MISCELLANEOUS) IMPLANT
GLOVE BIO SURGEON STRL SZ7.5 (GLOVE) ×6 IMPLANT
GOWN STRL REUS W/TWL LRG LVL3 (GOWN DISPOSABLE) ×3 IMPLANT
HOLDER FOLEY CATH W/STRAP (MISCELLANEOUS) ×3 IMPLANT
HOVERMATT SINGLE USE (MISCELLANEOUS) ×3 IMPLANT
IV NS 1000ML (IV SOLUTION) ×3
IV NS 1000ML BAXH (IV SOLUTION) ×1 IMPLANT
KIT TURNOVER CYSTO (KITS) ×3 IMPLANT
NDL SPNL 22GX3.5 QUINCKE BK (NEEDLE) IMPLANT
NEEDLE SPNL 22GX3.5 QUINCKE BK (NEEDLE) IMPLANT
PACK VAGINAL MINOR WOMEN LF (CUSTOM PROCEDURE TRAY) ×3 IMPLANT
PACKING VAGINAL (PACKING) IMPLANT
PAD ABD 8X10 STRL (GAUZE/BANDAGES/DRESSINGS) ×3 IMPLANT
PAD OB MATERNITY 4.3X12.25 (PERSONAL CARE ITEMS) IMPLANT
PLUG CATH AND CAP STER (CATHETERS) IMPLANT
SET IRRIG Y TYPE TUR BLADDER L (SET/KITS/TRAYS/PACK) ×3 IMPLANT
TOWEL OR 17X24 6PK STRL BLUE (TOWEL DISPOSABLE) ×6 IMPLANT
TUBE CONNECTING 12'X1/4 (SUCTIONS)
TUBE CONNECTING 12X1/4 (SUCTIONS) IMPLANT
WATER STERILE IRR 500ML POUR (IV SOLUTION) ×3 IMPLANT

## 2018-04-18 NOTE — Patient Instructions (Addendum)
IMMEDIATELY FOLLOWING SURGERY: Do not drive or operate machinery for the first twenty four hours after surgery. Do not make any important decisions for twenty four hours after surgery or while taking narcotic pain medications or sedatives. If you develop intractable nausea and vomiting or a severe headache please notify your doctor immediately.   FOLLOW-UP: You do not need to follow up with anesthesia unless specifically instructed to do so.   WOUND CARE INSTRUCTIONS (if applicable): Expect some mild vaginal bleeding, but if large amount of bleeding occurs please contact Dr. Sondra Come at [phone] or the Radiation On-Call physician. Call for any fever greater than 101.0 degrees or increasing vaginal//abdominal pain or trouble urinating.   QUESTIONS?: Please feel free to call your physician or the hospital operator if you have any questions, and they will be happy to assist you. Resume all medications: as listed on your after visit summary. Your next appointment is:  Future Appointments  Date Time Provider [place]  04/18/2018  2:00 PM Gery Pray, MD [facility] None  05/08/2018  3:00 PM [facility] FLUSH NURSE [facility] None  06/19/2018  3:00 PM [facility] FLUSH NURSE [facility] None  07/20/2018  3:00 PM [name], MD [facility] None  07/31/2018  3:00 PM [facility] FLUSH NURSE [facility] None

## 2018-04-18 NOTE — Op Note (Signed)
04/18/2018  9:32 AM  PATIENT:  Misty Thomas  52 y.o. female  PRE-OPERATIVE DIAGNOSIS:  ENDOCERVIX CANCER  POST-OPERATIVE DIAGNOSIS:  ENDOCERVIX CANCER  PROCEDURE:  Procedure(s): TANDEM RING INSERTION (N/A)  SURGEON:  Surgeon(s) and Role:    * Gery Pray, MD - Primary  PHYSICIAN ASSISTANT:   ASSISTANTS: none   ANESTHESIA:   general  EBL:  0 mL   BLOOD ADMINISTERED:none  DRAINS: Urinary Catheter (Foley)   LOCAL MEDICATIONS USED:  NONE  SPECIMEN:  No Specimen  DISPOSITION OF SPECIMEN:  N/A  COUNTS:  yes  TOURNIQUET:  * No tourniquets in log *  DICTATION:  patient transported to the [place], outpatient facility. She was prepped and draped in the usual sterile fashion and placed in the dorsal lithotomy position. A timeout for the procedure, estimated time of the procedure, preoperative Allergies and antibiotics was reviewed. The patient had placement of a Foley catheter without difficulty. The bladder was then back filled with approximately 250 mL of sterile water. Patient proceeded to undergo intraoperative ultrasound through transabdominal approach. Excellent images were obtained with the new ultrasound equipment despite the patient's body habitus.  The patient continues to have necrotic tumor at the cervical os. There is no obvious parametrial extension at this time although exam somewhat difficult given the patient's weight issues. On bimanual and rectovaginal examination the patient was noted to have a prominent left perirectal lymph node which was estimated to be approximately 2 x 3 cm in size. This was noted on initial imaging studies. This node was covered by her external beam radiation fields.There was no rectal mucosal involvement by this large perirectal node. On intraoperative ultrasound images the patient was noted to have a large uterine fibroid which is located primarily along the right side of the uterus and anterior.Cervical dilation and sounding was much easier  this week compared to week1, suggesting some shrinkage of the endocervical component of the lesion. The uterus sounded to approximately7.5 cm. Patient proceeded to undergo serial dilation of the cervical os. A 60 mm cervical sleeve was placed in the endocervical and endometrial canal canal. Excellent ultrasound images were obtained. .I was then able to place of a45 60 mm tandem with ultrasound guidance. This did show good placement within the endometrial canal.The patient then had a45degreering with a small shielding capattached to the tandem followed by a rectal paddle. Patient tolerated the procedure well and she was subsequently transported to the recovery room in stable condition. Later in the day the patient will be transferred to radiation oncology for planning and herfifthhigh-dose-rate treatment. This last HDR treatment will complete the patient's definitive course of radiation along with radiosensitizing chemotherapy,    PLAN OF CARE: transferred to radiation oncology for planning and treatment  PATIENT DISPOSITION:  PACU - hemodynamically stable.   Delay start of Pharmacological VTE agent (>24hrs) due to surgical blood loss or risk of bleeding: not applicable

## 2018-04-18 NOTE — Anesthesia Procedure Notes (Addendum)
Procedure Name: LMA Insertion Date/Time: 04/18/2018 8:45 AM Performed by: Cynda Familia, CRNA Pre-anesthesia Checklist: Patient identified, Emergency Drugs available, Suction available and Patient being monitored Patient Re-evaluated:Patient Re-evaluated prior to induction Oxygen Delivery Method: Circle System Utilized Preoxygenation: Pre-oxygenation with 100% oxygen Induction Type: IV induction Ventilation: Mask ventilation without difficulty LMA: LMA inserted LMA Size: 4.0 Number of attempts: 1 Placement Confirmation: positive ETCO2 Tube secured with: Tape Dental Injury: Teeth and Oropharynx as per pre-operative assessment  Comments: Smooth IV induction  LMA insertion atraumatic-- teeth and mouth as preop- small chips present to front teeth-- Smith Robert present-- [employer]

## 2018-04-18 NOTE — Anesthesia Postprocedure Evaluation (Signed)
Anesthesia Post Note  Patient: Misty Thomas  Procedure(s) Performed: TANDEM RING INSERTION (N/A Cervix)     Patient location during evaluation: PACU Anesthesia Type: General Level of consciousness: awake and alert Pain management: pain level controlled Vital Signs Assessment: post-procedure vital signs reviewed and stable Respiratory status: spontaneous breathing, nonlabored ventilation, respiratory function stable and patient connected to nasal cannula oxygen Cardiovascular status: blood pressure returned to baseline and stable Postop Assessment: no apparent nausea or vomiting Anesthetic complications: no    Last Vitals:  Vitals:   04/18/18 1015 04/18/18 1030  BP: 114/68 117/66  Pulse: 60 (!) 59  Resp: 13 (!) 9  Temp:    SpO2: 100% 100%    Last Pain:  Vitals:   04/18/18 1030  TempSrc:   PainSc: [place]

## 2018-04-18 NOTE — Transfer of Care (Signed)
Immediate Anesthesia Transfer of Care Note  Patient: MEMORIE YOKOYAMA  Procedure(s) Performed: TANDEM RING INSERTION (N/A Cervix)  Patient Location: PACU  Anesthesia Type:General  Level of Consciousness: awake and alert   Airway & Oxygen Therapy: Patient Spontanous Breathing and Patient connected to face mask oxygen  Post-op Assessment: Report given to RN and Post -op Vital signs reviewed and stable  Post vital signs: Reviewed and stable  Last Vitals:  Vitals Value Taken Time  BP 131/83 04/18/2018  9:17 AM  Temp 36.2 C 04/18/2018  9:17 AM  Pulse 85 04/18/2018  9:20 AM  Resp 14 04/18/2018  9:20 AM  SpO2 100 % 04/18/2018  9:20 AM  Vitals shown include unvalidated device data.  Last Pain:  Vitals:   04/18/18 0658  TempSrc:   PainSc: 2       Patients Stated Pain Goal: 4 (05/18/18 7588)  Complications: No apparent anesthesia complications

## 2018-04-18 NOTE — Patient Instructions (Signed)
IMMEDIATELY FOLLOWING SURGERY: Do not drive or operate machinery for the first twenty four hours after surgery. Do not make any important decisions for twenty four hours after surgery or while taking narcotic pain medications or sedatives. If you develop intractable nausea and vomiting or a severe headache please notify your doctor immediately.   FOLLOW-UP: You do not need to follow up with anesthesia unless specifically instructed to do so.   WOUND CARE INSTRUCTIONS (if applicable): Expect some mild vaginal bleeding, but if large amount of bleeding occurs please contact Dr. Sondra Come at [phone] or the Radiation On-Call physician. Call for any fever greater than 101.0 degrees or increasing vaginal//abdominal pain or trouble urinating.   QUESTIONS?: Please feel free to call your physician or the hospital operator if you have any questions, and they will be happy to assist you. Resume all medications: as listed on your after visit summary. Your next appointment is:  Future Appointments  Date Time Provider [place]  05/08/2018  3:00 PM [facility] FLUSH NURSE [facility] None  06/19/2018  3:00 PM [facility] FLUSH NURSE [facility] None  07/20/2018  3:00 PM [name], MD [facility] None  07/31/2018  3:00 PM [facility] FLUSH NURSE [facility] None

## 2018-04-18 NOTE — Anesthesia Preprocedure Evaluation (Addendum)
Anesthesia Evaluation  Patient identified by MRN, date of birth, ID band Patient awake    Reviewed: Allergy & Precautions, NPO status , Patient's Chart, lab work & pertinent test results  Airway Mallampati: III  TM Distance: >3 FB Neck ROM: Full    Dental  (+) Dental Advisory Given, Teeth Intact, Chipped   Pulmonary    breath sounds clear to auscultation       Cardiovascular negative cardio ROS   Rhythm:Regular Rate:Normal     Neuro/Psych negative neurological ROS  negative psych ROS   GI/Hepatic negative GI ROS, Neg liver ROS,   Endo/Other  diabetes, Type 2Hypothyroidism   Renal/GU      Musculoskeletal   Abdominal (+) + obese,   Peds  Hematology  (+) anemia ,   Anesthesia Other Findings   Reproductive/Obstetrics                          Lab Results  Component Value Date   WBC 4.1 04/17/2018   HGB 10.1 (L) 04/17/2018   HCT 30.8 (L) 04/17/2018   MCV 93.9 04/17/2018   PLT 377 04/17/2018   Lab Results  Component Value Date   CREATININE 1.37 (H) 04/17/2018   BUN 21 (H) 04/17/2018   NA 139 04/17/2018   K 4.7 04/17/2018   CL 106 04/17/2018   CO2 24 04/17/2018   Lab Results  Component Value Date   INR 1.03 02/01/2018   EKG: normal sinus rhythm.   Anesthesia Physical Anesthesia Plan  ASA: III  Anesthesia Plan: General   Post-op Pain Management:    Induction: Intravenous  PONV Risk Score and Plan: 4 or greater and Ondansetron, Dexamethasone, Midazolam and Scopolamine patch - Pre-op  Airway Management Planned: LMA  Additional Equipment: None  Intra-op Plan:   Post-operative Plan: Extubation in OR  Informed Consent: I have reviewed the patients History and Physical, chart, labs and discussed the procedure including the risks, benefits and alternatives for the proposed anesthesia with the patient or authorized representative who has indicated his/her understanding and  acceptance.   Dental advisory given  Plan Discussed with: CRNA  Anesthesia Plan Comments:        Anesthesia Quick Evaluation

## 2018-04-18 NOTE — Progress Notes (Signed)
  Radiation Oncology         [phone] ________________________________  Name: Misty Thomas MRN: [id number]  Date: 04/18/2018  DOB: May 08, 1966  CC: Default, Provider, MD  Everitt Amber, MD  HDR BRACHYTHERAPY NOTE  DIAGNOSIS: Malignant neoplasm of endocervix ([place]). Stage IIIB, squamous cell carcinoma of the cervix  NARRATIVE: The patient was brought to the HDR suite. Identity was confirmed. All relevant records and images related to the planned course of therapy were reviewed. The patient freely provided informed written consent to proceed with treatment after reviewing the details related to the planned course of therapy. The consent form was witnessed and verified by the simulation staff. Then, the patient was set-up in a stable reproducible supine position for radiation therapy. The tandem ring system was accessed and fiducial markers were placed within the tandem and ring.   Simple treatment device note: On the operating room the patient had construction of her custom tandem ring system. She will be treated with a 45 tandem/ring system. The patient had placement of a 60 mm tandem. A cervical ring with a small shielding was used for her treatment. A rectal paddle was also part of her custom set up device.  Verification simulation note: An AP and lateral film was obtained through the pelvis area. This was compared to the patient's planning films documenting accurate position of the tandem/ring system for treatment.  High-dose-rate brachytherapy treatment note:  The remote afterloading device was accessed through catheter system and attached to the tandem ring system. Patient then proceeded to undergo her 5th high-dose-rate treatment directed at the cervix. The patient was prescribed a dose of 5.5 gray to be delivered to the [place]. Patient was treated with 2 channels using 22 dwell positions. Treatment time was 728.4 seconds. The patient tolerated the procedure well. After completion of her  therapy, a radiation survey was performed documenting return of the iridium source into the GammaMed safe. The patient was then transferred to the nursing suite. She then had removal of the rectal paddle followed by the tandem and ring system. The patient tolerated the removal well.  PLAN: Patient has completed her radiation therapy. She will have a routine follow up in one month. ________________________________  Blair Promise, PhD, MD  This document serves as a record of services personally performed by Gery Pray, MD. It was created on his behalf by Wilburn Mylar, a trained medical scribe. The creation of this record is based on the scribe's personal observations and the provider's statements to them. This document has been checked and approved by the attending provider.

## 2018-04-18 NOTE — Anesthesia Procedure Notes (Signed)
Date/Time: 04/18/2018 9:06 AM Performed by: Cynda Familia, CRNA Oxygen Delivery Method: Simple face mask Placement Confirmation: positive ETCO2 and breath sounds checked- equal and bilateral Dental Injury: Teeth and Oropharynx as per pre-operative assessment

## 2018-04-18 NOTE — Progress Notes (Signed)
  Radiation Oncology         [phone] ________________________________  Name: Misty Thomas MRN: [id number]  Date: 04/18/2018  DOB: [birth year]  SIMULATION AND TREATMENT PLANNING NOTE HDR BRACHYTHERAPY  DIAGNOSIS:  Malignant neoplasm of endocervix ([place]). Stage IIIB, squamous cell carcinoma of the cervix  NARRATIVE:  The patient was brought to the [facility].  Identity was confirmed.  All relevant records and images related to the planned course of therapy were reviewed.  The patient freely provided informed written consent to proceed with treatment after reviewing the details related to the planned course of therapy. The consent form was witnessed and verified by the simulation staff.  Then, the patient was set-up in a stable reproducible  supine position for radiation therapy.  CT images were obtained.  Surface markings were placed.  The CT images were loaded into the planning software.  Then the target and avoidance structures were contoured.  Treatment planning then occurred.  The radiation prescription was entered and confirmed.   I have requested : Brachytherapy Isodose Plan and Dosimetry Calculations to plan the radiation distribution.    PLAN:  The patient will receive 5.5 Gy in 1 fraction.    ________________________________  Blair Promise, PhD, MD  This document serves as a record of services personally performed by Gery Pray, MD. It was created on his behalf by Wilburn Mylar, a trained medical scribe. The creation of this record is based on the scribe's personal observations and the provider's statements to them. This document has been checked and approved by the attending provider.

## 2018-04-18 NOTE — Interval H&P Note (Signed)
History and Physical Interval Note:  04/18/2018 8:24 AM  Misty Thomas  has presented today for surgery, with the diagnosis of ENDOCERVIX CANCER  The various methods of treatment have been discussed with the patient and family. After consideration of risks, benefits and other options for treatment, the patient has consented to  Procedure(s): TANDEM RING INSERTION (N/A) as a surgical intervention .  The patient's history has been reviewed, patient examined, no change in status, stable for surgery.  I have reviewed the patient's chart and labs.  Questions were answered to the patient's satisfaction.     Gery Pray

## 2018-04-19 ENCOUNTER — Encounter: Payer: Self-pay | Admitting: Radiation Oncology

## 2018-04-19 ENCOUNTER — Encounter (HOSPITAL_BASED_OUTPATIENT_CLINIC_OR_DEPARTMENT_OTHER): Payer: Self-pay | Admitting: Radiation Oncology

## 2018-04-19 NOTE — Progress Notes (Signed)
  Radiation Oncology         [phone] ________________________________  Name: Misty Thomas MRN: [id number]  Date: 04/19/2018  DOB: 27-Feb-1966  End of Treatment Note  Diagnosis:   Malignant neoplasm of endocervix ([place]).Stage IIIB, squamous cell carcinoma of the cervix  Indication for treatment:  Curative       Radiation treatment dates:   External therapy: 02/05/2018-03/16/2018, Brachytherapy: 03/20/2018, 03/26/2018, 04/04/2018, 04/12/2018, 04/18/2018  Site/dose:   1. Cervix/pelvis, 1.8 Gy in 25 fractions for a total dose of 45 Gy           2. Sidewall/ parametrial Boost, 1.8 Gy in 5 fractions for a total dose of 9 Gy           3. cervix, 5.5 Gy/fx,  5 fractions for a total dose of 27.5 Gy  Beams/energy:   1. 3D, 15X                   2. 3D, 15X                   3. HDR, Ir-192, tandem ring system  Narrative: The patient tolerated radiation treatment relatively well. At the start of her treatments, she noted 4/10 pelvic and lower back pain, constipation/hard stools while on sennakot, and vaginal spotting. Towards the end of her treatment, she reported mild fatigue, resolved vaginal bleeding, and constipation. She denied pain, nausea, vomiting, and rectal bleeding. On PE, it was noted that through limited PE, shrinkage of the advanced cervical mass was noted.   Plan: The patient has completed radiation treatment. The patient will return to radiation oncology clinic for routine followup in one month. I advised them to call or return sooner if they have any questions or concerns related to their recovery or treatment.  -----------------------------------  Blair Promise, PhD, MD  This document serves as a record of services personally performed by Gery Pray, MD. It was created on his behalf by [facility], a trained medical scribe. The creation of this record is based on the scribe's personal observations and the provider's statements to them. This document has been checked and approved  by the attending provider.

## 2018-05-07 ENCOUNTER — Telehealth: Payer: Self-pay | Admitting: Pharmacist Clinician (PhC)/ Clinical Pharmacy Specialist

## 2018-05-07 NOTE — Telephone Encounter (Signed)
Misty Thomas was uninsured when she saw [name] in May. She was supposed to have insurance with her husband plan however, it looks like [name] can't run it through. Told her to bring in her husband's pay stubs and son disability so we can continue with her [employer] until the insurance is sorted out. She will bring them tomorrow.

## 2018-05-08 ENCOUNTER — Other Ambulatory Visit: Payer: Self-pay

## 2018-05-08 ENCOUNTER — Telehealth: Payer: Self-pay

## 2018-05-08 ENCOUNTER — Inpatient Hospital Stay: Payer: BLUE CROSS/BLUE SHIELD | Attending: Gynecologic Oncology

## 2018-05-08 DIAGNOSIS — Z452 Encounter for adjustment and management of vascular access device: Secondary | ICD-10-CM | POA: Insufficient documentation

## 2018-05-08 DIAGNOSIS — C530 Malignant neoplasm of endocervix: Secondary | ICD-10-CM | POA: Diagnosis not present

## 2018-05-08 MED ORDER — SODIUM CHLORIDE 0.9% FLUSH
10.0000 mL | Freq: Once | INTRAVENOUS | Status: AC
  Administered 2018-05-08: 10 mL
  Filled 2018-05-08: qty 10

## 2018-05-08 MED ORDER — HEPARIN SOD (PORK) LOCK FLUSH 100 UNIT/ML IV SOLN
250.0000 [IU] | Freq: Once | INTRAVENOUS | Status: AC
  Administered 2018-05-08: 500 [IU]
  Filled 2018-05-08: qty 5

## 2018-05-08 MED ORDER — MORPHINE SULFATE 15 MG PO TABS: 15.0000 mg | ORAL_TABLET | ORAL | 0 refills | Status: DC | PRN

## 2018-05-08 MED FILL — MORPHINE SULFATE IR 15 MG T: 15 | 10 days supply | Qty: 60 | Fill #0

## 2018-05-08 NOTE — Telephone Encounter (Signed)
She is in the lobby requesting Morphine Rx.

## 2018-05-14 ENCOUNTER — Telehealth: Payer: Self-pay

## 2018-05-14 NOTE — Telephone Encounter (Signed)
Spoke with pt in regards to returning to work paperwork. Pt verbalized understanding that she is not to take her morphine during work hours and she can take Tylenol if needed for pain. Advised that paperwork was ready for pick up and she can come anytime during the week to pick it up.

## 2018-05-16 ENCOUNTER — Encounter: Payer: Self-pay | Admitting: Hematology and Oncology

## 2018-05-16 NOTE — Progress Notes (Signed)
Patient came in with supporting documents for [place]. She was mailed a list of what to submit but no application enclosed.  Gave patient application to complete while here and made copies of supporting documents. Placed in outgoing mail to Customer Service Department and advised they will notify her via mail in about 3 weeks or so. Patient verbalized understanding.

## 2018-05-21 ENCOUNTER — Telehealth: Payer: Self-pay | Admitting: Pharmacist Clinician (PhC)/ Clinical Pharmacy Specialist

## 2018-05-21 NOTE — Telephone Encounter (Signed)
Misty Thomas's insurance doesn't cover anything. Misty Thomas will submit her ADAP application once her husband can bring in another paystub. Since we have her husband paystubs here, will submit her for [employer] for bridging. Application faxed 12/10/93

## 2018-05-24 MED FILL — BIKTARVY 50-200-25 MG TABS: 50-200-25 | 30 days supply | Qty: 30 | Fill #2

## 2018-06-04 ENCOUNTER — Telehealth: Payer: Self-pay

## 2018-06-04 ENCOUNTER — Other Ambulatory Visit: Payer: Self-pay | Admitting: Hematology and Oncology

## 2018-06-04 MED ORDER — MORPHINE SULFATE 15 MG PO TABS: 15.0000 mg | ORAL_TABLET | Freq: Four times a day (QID) | ORAL | 0 refills | Status: DC | PRN

## 2018-06-04 NOTE — Telephone Encounter (Signed)
She called and left a message. She needs a refill on her Morphine RX. She is completely out.

## 2018-06-04 NOTE — Telephone Encounter (Signed)
Called and left a message that Rx ready for pickup.

## 2018-06-05 DIAGNOSIS — M549 Dorsalgia, unspecified: Secondary | ICD-10-CM | POA: Insufficient documentation

## 2018-06-05 DIAGNOSIS — Z5321 Procedure and treatment not carried out due to patient leaving prior to being seen by health care provider: Secondary | ICD-10-CM | POA: Insufficient documentation

## 2018-06-05 MED FILL — MORPHINE SULFATE IR 15 MG T: 15 | 15 days supply | Qty: 60 | Fill #0

## 2018-06-06 ENCOUNTER — Encounter (HOSPITAL_COMMUNITY): Payer: Self-pay | Admitting: Emergency Medicine

## 2018-06-06 ENCOUNTER — Emergency Department (HOSPITAL_COMMUNITY)
Admission: EM | Admit: 2018-06-06 | Discharge: 2018-06-06 | Discharge status: Against Medical Advice | Payer: BLUE CROSS/BLUE SHIELD | Attending: Emergency Medicine | Admitting: Emergency Medicine

## 2018-06-06 ENCOUNTER — Other Ambulatory Visit: Payer: Self-pay

## 2018-06-06 NOTE — ED Notes (Signed)
When staff went to triage room 4 to take pt back to room 22, pt was not in room. Staff looked for pt in bathrooms and lobby, unable to locate pt.

## 2018-06-06 NOTE — ED Triage Notes (Signed)
Pt arriving with back pain. Pt has hx of cervical cancer, no longer receiving treatments. Pt reports cervical pain.

## 2018-06-06 NOTE — ED Provider Notes (Cosign Needed)
Went to room to examine patient and there was no patient in the room.  Nursing staff reports that she left without being seen.   Doristine Devoid, PA-C 06/06/18 [phone]

## 2018-06-14 ENCOUNTER — Encounter (HOSPITAL_COMMUNITY): Payer: Self-pay | Admitting: *Deleted

## 2018-06-14 ENCOUNTER — Inpatient Hospital Stay (HOSPITAL_COMMUNITY)
Admission: AD | Admit: 2018-06-14 | Discharge: 2018-06-14 | Disposition: A | Discharge status: Home / Self Care | Payer: BLUE CROSS/BLUE SHIELD | Source: Ambulatory Visit | Attending: Obstetrics and Gynecology | Admitting: Obstetrics and Gynecology

## 2018-06-14 DIAGNOSIS — D61818 Other pancytopenia: Secondary | ICD-10-CM | POA: Insufficient documentation

## 2018-06-14 DIAGNOSIS — G893 Neoplasm related pain (acute) (chronic): Secondary | ICD-10-CM | POA: Insufficient documentation

## 2018-06-14 DIAGNOSIS — M533 Sacrococcygeal disorders, not elsewhere classified: Secondary | ICD-10-CM | POA: Diagnosis not present

## 2018-06-14 DIAGNOSIS — E039 Hypothyroidism, unspecified: Secondary | ICD-10-CM | POA: Insufficient documentation

## 2018-06-14 DIAGNOSIS — Z79899 Other long term (current) drug therapy: Secondary | ICD-10-CM | POA: Insufficient documentation

## 2018-06-14 DIAGNOSIS — C530 Malignant neoplasm of endocervix: Secondary | ICD-10-CM | POA: Diagnosis not present

## 2018-06-14 DIAGNOSIS — R102 Pelvic and perineal pain: Secondary | ICD-10-CM

## 2018-06-14 DIAGNOSIS — Z79890 Hormone replacement therapy (postmenopausal): Secondary | ICD-10-CM | POA: Insufficient documentation

## 2018-06-14 DIAGNOSIS — Z8614 Personal history of Methicillin resistant Staphylococcus aureus infection: Secondary | ICD-10-CM | POA: Diagnosis not present

## 2018-06-14 DIAGNOSIS — M545 Low back pain: Secondary | ICD-10-CM | POA: Diagnosis present

## 2018-06-14 DIAGNOSIS — Z9221 Personal history of antineoplastic chemotherapy: Secondary | ICD-10-CM | POA: Insufficient documentation

## 2018-06-14 DIAGNOSIS — E119 Type 2 diabetes mellitus without complications: Secondary | ICD-10-CM | POA: Insufficient documentation

## 2018-06-14 DIAGNOSIS — R82998 Other abnormal findings in urine: Secondary | ICD-10-CM

## 2018-06-14 DIAGNOSIS — Z923 Personal history of irradiation: Secondary | ICD-10-CM | POA: Diagnosis not present

## 2018-06-14 LAB — URINALYSIS, ROUTINE W REFLEX MICROSCOPIC
Bilirubin Urine: NEGATIVE
Glucose, UA: NEGATIVE mg/dL
HGB URINE DIPSTICK: NEGATIVE
KETONES UR: NEGATIVE mg/dL
Nitrite: NEGATIVE
PH: 5 (ref 5.0–8.0)
Protein, ur: 30 mg/dL — AB
Specific Gravity, Urine: 1.018 (ref 1.005–1.030)

## 2018-06-14 MED ORDER — SULFAMETHOXAZOLE-TRIMETHOPRIM 800-160 MG PO TABS: 1.0000 | ORAL_TABLET | Freq: Two times a day (BID) | ORAL | 1 refills | Status: DC

## 2018-06-14 MED ORDER — OXYCODONE-ACETAMINOPHEN 5-325 MG PO TABS: 1.0000 | ORAL_TABLET | Freq: Four times a day (QID) | ORAL | 0 refills | Status: DC | PRN

## 2018-06-14 MED ORDER — OXYCODONE-ACETAMINOPHEN 5-325 MG PO TABS
2.0000 | ORAL_TABLET | Freq: Once | ORAL | Status: AC
  Administered 2018-06-14: 2 via ORAL
  Filled 2018-06-14: qty 2

## 2018-06-14 NOTE — MAU Note (Signed)
Pt C/O lower back x 2 weeks, has ongoing cervical pain, hx of cervical cancer - dx'd in March.  Denies vaginal bleeding.  No dysuria, unsure if she has had a fever.

## 2018-06-14 NOTE — MAU Provider Note (Signed)
Chief Complaint:  Back Pain   First Provider Initiated Contact with Patient 06/14/18 2028     HPI: Misty Thomas is a 52 y.o. [id number] who presents to maternity admissions reporting low back pain for 2 weeks.  Has not called her doctor about this. States is not any different tonight, just "got tired of hurting".   Has cervical cancer and is s/p cervical radiation.  States her doctor did not tell her about any side effects from radiation.  .Thinks it might be a urinary tract infection. She reports no vaginal bleeding, vaginal itching/burning, urinary symptoms, h/a, dizziness, n/v, or fever/chills.    Back Pain  This is a recurrent problem. The current episode started 1 to 4 weeks ago. The problem occurs constantly. The problem is unchanged. Pain location: sacrum. The quality of the pain is described as aching. The pain does not radiate. The pain is severe. The pain is the same all the time. The symptoms are aggravated by lying down and position. Stiffness is present all day. Associated symptoms include abdominal pain and pelvic pain. Pertinent negatives include no chest pain, dysuria, fever, headaches, leg pain, numbness, paresis, paresthesias, tingling or weakness. Risk factors include history of cancer. She has tried analgesics (Takes Morphine 15mg  every 4 hours) for the symptoms. The treatment provided mild relief.   RN note: Pt C/O lower back x 2 weeks, has ongoing cervical pain, hx of cervical cancer - dx'd in March.  Denies vaginal bleeding.  No dysuria, unsure if she has had a fever  Past Medical History: Past Medical History:  Diagnosis Date  . Acquired pancytopenia ([place]) 03/2018  . Anemia    Mild  . Cancer associated pain   . 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 Chemotherapy induced nausea and vomiting   . Diabetes mellitus type 2, diet-controlled ([place])   . Frequency of urination   . History of cellulitis 2018   bilateral lower leg w/ mrsa  . History of external beam radiation therapy 02-05-2018  to 03-16-2018   cervical cancer  . History of MRSA infection 2018   w/ bilateral lower leg cellulitis  . HIV (human immunodeficiency virus infection) ([place])    asymptomatic  . Hypomagnesemia 03/2018   Severe---- takes oral magnesium and IV magnesium as needed (cancer center)  . Intermittent diarrhea    due to radiation/ chemo  . Nocturia   . Port-A-Cath in place    Power port  . Radiation burn    LOWER ABD.  04-06-2018  per is healing  . Subclinical hypothyroidism    w/ thyroiditis , dx 01-22-2018 PET scan  . Thrombocytopenia ([place])   . Wears glasses     Past obstetric history: OB History  Gravida Para Term Preterm AB Living  5 3 3   2 3   SAB TAB Ectopic Multiple Live Births    2          # Outcome Date GA Lbr Len/2nd Weight Sex Delivery Anes PTL Lv  5 TAB           4 TAB           3 Term           2 Term  1 Term             Past Surgical History: Past Surgical History:  Procedure Laterality Date  . EUA/ CERVICAL BX  01-04-2018   dr Ihor Dow  [facility]  . IR FLUORO GUIDE PORT INSERTION RIGHT  02/01/2018  . TANDEM RING INSERTION N/A 03/20/2018   Procedure: TANDEM RING INSERTION;  Surgeon: Gery Pray, MD;  Location: [facility];  Service: Urology;  Laterality: N/A;  . TANDEM RING INSERTION N/A 03/26/2018   Procedure: TANDEM RING INSERTION;  Surgeon: Gery Pray, MD;  Location: [facility];  Service: Urology;  Laterality: N/A;  . TANDEM RING INSERTION N/A 04/04/2018   Procedure: TANDEM RING INSERTION;  Surgeon: Gery Pray, MD;  Location[facility];  Service: Urology;  Laterality: N/A;  . TANDEM RING INSERTION N/A 04/12/2018   Procedure: TANDEM RING  INSERTION;  Surgeon: Gery Pray, MD;  Location: [facility];  Service: Urology;  Laterality: N/A;  . TANDEM RING INSERTION N/A 04/18/2018   Procedure: TANDEM RING INSERTION;  Surgeon: Gery Pray, MD;  Location: [facility];  Service: Urology;  Laterality: N/A;  . TUBAL LIGATION Bilateral 1990s    Family History: Family History  Problem Relation Age of Onset  . Cancer Maternal Grandmother        unknown ca    Social History: Social History   Tobacco Use  . Smoking status: Never Smoker  . Smokeless tobacco: Never Used  Substance Use Topics  . Alcohol use: Not Currently    Frequency: Never  . Drug use: No    Allergies: No Known Allergies  Meds:  Medications Prior to Admission  Medication Sig Dispense Refill Last Dose  . acetaminophen (TYLENOL) 500 MG tablet Take 500 mg by mouth every 6 (six) hours as needed.   06/13/2018 at Unknown time  . bictegravir-emtricitabine-tenofovir AF (BIKTARVY) 50-200-25 MG TABS tablet Take 1 tablet by mouth daily. (Patient taking differently: Take 1 tablet by mouth every evening. ) 30 tablet 2 06/13/2018 at Unknown time  . docusate sodium (COLACE) 100 MG capsule Take 100 mg by mouth every morning.    06/14/2018 at Unknown time  . magnesium oxide (MAG-OX) 400 (241.3 Mg) MG tablet Take 1 tablet (400 mg total) by mouth 2 (two) times daily. 60 tablet 3 Past Week at Unknown time  . morphine (MSIR) 15 MG tablet Take 1 tablet (15 mg total) by mouth every 6 (six) hours as needed for severe pain. 60 tablet 0 06/14/2018 at Unknown time  . levothyroxine (SYNTHROID, LEVOTHROID) 25 MCG tablet Take 1 tablet (25 mcg total) by mouth daily before breakfast. (Patient taking differently: Take 25 mcg by mouth daily before breakfast. ) 30 tablet 1 More than a month at Unknown time  . Misc Natural Products (ESTROVEN + ENERGY MAX STRENGTH PO) Take 1 tablet by mouth daily.   More than a month at Unknown time  . ondansetron (ZOFRAN) 4 MG tablet Take 1  tablet (4 mg total) by mouth every 8 (eight) hours as needed for nausea or vomiting. 20 tablet 0 More than a month at Unknown time  . sulfamethoxazole-trimethoprim (BACTRIM) 400-80 MG tablet Take 1 tablet by mouth daily. 30 tablet 1 More than a month at Unknown time    I have reviewed patient's Past Medical Hx, Surgical Hx, Family Hx, Social Hx, medications and allergies.  ROS:  Review of Systems  Constitutional: Negative for fever.  Cardiovascular: Negative for chest pain.  Gastrointestinal: Positive  for abdominal pain.  Genitourinary: Positive for pelvic pain. Negative for dysuria.  Musculoskeletal: Positive for back pain.  Neurological: Negative for tingling, weakness, numbness, headaches and paresthesias.   Other systems negative     Physical Exam   Patient Vitals for the past 24 hrs:  BP Temp Temp src Pulse Resp Weight  06/14/18 1855 129/79 98.7 F (37.1 C) Oral 92 16 98.4 kg   Constitutional: Well-developed, well-nourished female in no acute distress. Rocking back and forth on bed.  Cardiovascular: normal rate and rhythm, no ectopy audible, S1 & S2 heard, no murmur Respiratory: normal effort, no distress. Lungs CTAB with no wheezes or crackles GI: Abd soft, non-tender.  Nondistended.  No rebound, No guarding.  Bowel Sounds audible  MS: Extremities nontender, no edema, normal ROM  Pain is located over sacrum.  NO point tenderness. Neurologic: Alert and oriented x 4.   Grossly nonfocal. GU: Neg CVAT. Skin:  Warm and Dry Psych:  Affect appropriate.  PELVIC EXAM: deferred   Labs: Results for orders placed or performed during the hospital encounter of 06/14/18 (from the past 24 hour(s))  Urinalysis, Routine w reflex microscopic     Status: Abnormal   Collection Time: 06/14/18  7:31 PM  Result Value Ref Range   Color, Urine YELLOW YELLOW   APPearance HAZY (A) CLEAR   Specific Gravity, Urine 1.018 1.005 - 1.030   pH 5.0 5.0 - 8.0   Glucose, UA NEGATIVE NEGATIVE mg/dL    Hgb urine dipstick NEGATIVE NEGATIVE   Bilirubin Urine NEGATIVE NEGATIVE   Ketones, ur NEGATIVE NEGATIVE mg/dL   Protein, ur 30 (A) NEGATIVE mg/dL   Nitrite NEGATIVE NEGATIVE   Leukocytes, UA SMALL (A) NEGATIVE   RBC / HPF 0-5 0 - 5 RBC/hpf   WBC, UA 0-5 0 - 5 WBC/hpf   Bacteria, UA RARE (A) NONE SEEN   Squamous Epithelial / LPF 0-5 0 - 5   Mucus PRESENT       Imaging:  No results found.  MAU Course/MDM: I have ordered labs as follows:  UA and culture.  Small leukocytes and protein so will culture. Will provide Rx for antibiotics in case culture is positive Imaging ordered: none Results reviewed.    Consult Dr Rosana Hoes.  She recommends trying a different analgesic (has been over 4 hours since morphine. Would avoid flexeril due to interactions. .   Treatments in MAU included Percocet 5-325 two tabs given with excellent relief.  Will recommend she take no morphine while taking this.  Rx given for #8tablets of percocet and have her call her oncologist in the morning to see if they want to change medication regimen or not.  Discussed they are the ones treating her and I want them to determine which meds she should take for pain.  .   Pt stable at time of discharge.  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  Do not take Morphine within 6 hours of this med Rx sent for Bactrim DS for possible UTI. WIll culture urine.  Encouraged to return here or to other Urgent Care/ED if she develops worsening of symptoms, increase in pain, fever, or other concerning symptoms.   Hansel Feinstein CNM, MSN Certified Nurse-Midwife 06/14/2018 8:29 PM

## 2018-06-14 NOTE — Discharge Instructions (Signed)
External Beam Radiation Therapy, Care After This sheet gives you information about how to care for yourself after your procedure. Your health care provider may also give you more specific instructions. If you have problems or questions, contact your health care provider. What can I expect after the procedure? After the procedure, it is common to have:  Fatigue.  Red, flaking, dry skin in the treated area.  A sunburn-like rash on the skin in the treated area.  Hair loss in the treated area.  Itching in the treated area.  Other side effects may occur, depending on which part of the body was exposed to radiation and how much radiation was used. These may include:  Hair loss if the radiation therapy was directed to the head.  Coughing or difficulty swallowing if the radiation therapy was directed to the head, neck, or chest  Nausea, vomiting, or diarrhea if the radiation therapy was directed to the abdomen or pelvis.  Bladder problems, frequent urination, or sexual dysfunction if the radiation therapy was directed to the bladder, kidney, or prostate.  Memory loss and cognitive changes if the radiation therapy was directed to your brain.  Although some side effects may show up months to years later, most side effects are usually temporary and get better over time. It can take up to 3-4 weeks for you to regain your energy or for side effects to get better. Follow these instructions at home: Skin care  Wash your skin with a mild soap as told by your health care provider. Do not scrub or rub your skin. Pat yourself dry.  Use a mild shampoo and be gentle when washing your hair.  Apply gentle lotion or cream to the treated area as told by your health care provider.  Keep the treated area covered when you are outside. Do not expose treated skin to the sun.  Avoid scratching the treated area. General instructions   Do not use a heating pad or a warm cloth to relieve pain in the treated  area.  Take over-the-counter and prescription medicines only as told by your health care provider.  Follow your health care provider's advice on the type and amount of liquids to drink each day.  Try to maintain your weight during treatment. Ask your health care team for tips.  Keep all follow-up visits as told by your health care provider. This is important. The visits are usually scheduled 6 weeks to 6 months after radiation therapy. They are needed to determine if the radiation therapy worked as it was intended to. Contact a health care provider if:  You have pain in the treated area.  The redness worsens in the treated area.  Open skin or blisters develop in the treated area.  You have unexplained weight loss. Get help right away if:  You have a fever.  You have nausea or vomiting that lasts a long time.  You have diarrhea that lasts a long time. Summary  After this procedure, it is common to have fatigue, skin changes and other side effects depending on where the radiation therapy was given.  Although some side effects may show up months to years later, most side effects are usually temporary and get better over time. It can take up to 3-4 weeks for you to regain your energy or for side effects to get better.  Keep all follow-up visits as told by your health care provider. This is important. The visits are usually scheduled 6 weeks to 6 months after  radiation therapy. This information is not intended to replace advice given to you by your health care provider. Make sure you discuss any questions you have with your health care provider. Document Released: 10/29/2013 Document Revised: 09/28/2016 Document Reviewed: 09/28/2016 Elsevier Interactive Patient Education  2018 [employer]. Back Pain, Adult Many adults have back pain from time to time. Common causes of back pain include:  A strained muscle or ligament.  Wear and tear (degeneration) of the spinal  disks.  Arthritis.  A hit to the back.  Back pain can be short-lived (acute) or last a long time (chronic). A physical exam, lab tests, and imaging studies may be done to find the cause of your pain. Follow these instructions at home: Managing pain and stiffness  Take over-the-counter and prescription medicines only as told by your health care provider.  If directed, apply heat to the affected area as often as told by your health care provider. Use the heat source that your health care provider recommends, such as a moist heat pack or a heating pad. ? Place a towel between your skin and the heat source. ? Leave the heat on for 20-30 minutes. ? Remove the heat if your skin turns bright red. This is especially important if you are unable to feel pain, heat, or cold. You have a greater risk of getting burned.  If directed, apply ice to the injured area: ? Put ice in a plastic bag. ? Place a towel between your skin and the bag. ? Leave the ice on for 20 minutes, 2-3 times a day for the first 2-3 days. Activity  Do not stay in bed. Resting more than 1-2 days can delay your recovery.  Take short walks on even surfaces as soon as you are able. Try to increase the length of time you walk each day.  Do not sit, drive, or stand in one place for more than 30 minutes at a time. Sitting or standing for long periods of time can put stress on your back.  Use proper lifting techniques. When you bend and lift, use positions that put less stress on your back: ? [place] your knees. ? Keep the load close to your body. ? Avoid twisting.  Exercise regularly as told by your health care provider. Exercising will help your back heal faster. This also helps prevent back injuries by keeping muscles strong and flexible.  Your health care provider may recommend that you see a physical therapist. This person can help you come up with a safe exercise program. Do any exercises as told by your physical  therapist. Lifestyle  Maintain a healthy weight. Extra weight puts stress on your back and makes it difficult to have good posture.  Avoid activities or situations that make you feel anxious or stressed. Learn ways to manage anxiety and stress. One way to manage stress is through exercise. Stress and anxiety increase muscle tension and can make back pain worse. General instructions  Sleep on a firm mattress in a comfortable position. Try lying on your side with your knees slightly bent. If you lie on your back, put a pillow under your knees.  Follow your treatment plan as told by your health care provider. This may include: ? Cognitive or behavioral therapy. ? Acupuncture or massage therapy. ? Meditation or yoga. Contact a health care provider if:  You have pain that is not relieved with rest or medicine.  You have increasing pain going down into your legs or buttocks.  Your pain does not improve in 2 weeks.  You have pain at night.  You lose weight.  You have a fever or chills. Get help right away if:  You develop new bowel or bladder control problems.  You have unusual weakness or numbness in your arms or legs.  You develop nausea or vomiting.  You develop abdominal pain.  You feel faint. Summary  Many adults have back pain from time to time. A physical exam, lab tests, and imaging studies may be done to find the cause of your pain.  Use proper lifting techniques. When you bend and lift, use positions that put less stress on your back.  Take over-the-counter and prescription medicines and apply heat or ice as directed by your health care provider. This information is not intended to replace advice given to you by your health care provider. Make sure you discuss any questions you have with your health care provider. Document Released: 10/24/2005 Document Revised: 11/28/2016 Document Reviewed: 11/28/2016 Elsevier Interactive Patient Education  [employer].

## 2018-06-15 ENCOUNTER — Telehealth: Payer: Self-pay

## 2018-06-15 ENCOUNTER — Other Ambulatory Visit: Payer: Self-pay | Admitting: Internal Medicine

## 2018-06-15 ENCOUNTER — Inpatient Hospital Stay: Payer: BLUE CROSS/BLUE SHIELD | Attending: Gynecologic Oncology | Admitting: Medical

## 2018-06-15 ENCOUNTER — Telehealth: Payer: Self-pay | Admitting: *Deleted

## 2018-06-15 VITALS — BP 137/71 | HR 85 | Temp 97.8°F | Resp 18 | Ht 61.0 in | Wt 217.4 lb

## 2018-06-15 DIAGNOSIS — M545 Low back pain: Secondary | ICD-10-CM

## 2018-06-15 DIAGNOSIS — Z5189 Encounter for other specified aftercare: Secondary | ICD-10-CM | POA: Insufficient documentation

## 2018-06-15 DIAGNOSIS — R252 Cramp and spasm: Secondary | ICD-10-CM | POA: Insufficient documentation

## 2018-06-15 DIAGNOSIS — G893 Neoplasm related pain (acute) (chronic): Secondary | ICD-10-CM

## 2018-06-15 DIAGNOSIS — Z923 Personal history of irradiation: Secondary | ICD-10-CM | POA: Diagnosis not present

## 2018-06-15 DIAGNOSIS — E119 Type 2 diabetes mellitus without complications: Secondary | ICD-10-CM | POA: Diagnosis not present

## 2018-06-15 DIAGNOSIS — C530 Malignant neoplasm of endocervix: Secondary | ICD-10-CM | POA: Diagnosis not present

## 2018-06-15 DIAGNOSIS — B20 Human immunodeficiency virus [HIV] disease: Secondary | ICD-10-CM

## 2018-06-15 DIAGNOSIS — E038 Other specified hypothyroidism: Secondary | ICD-10-CM | POA: Diagnosis not present

## 2018-06-15 DIAGNOSIS — Z21 Asymptomatic human immunodeficiency virus [HIV] infection status: Secondary | ICD-10-CM | POA: Insufficient documentation

## 2018-06-15 DIAGNOSIS — D61818 Other pancytopenia: Secondary | ICD-10-CM | POA: Insufficient documentation

## 2018-06-15 MED ORDER — OXYCODONE-ACETAMINOPHEN 5-325 MG PO TABS: 1.0000 | ORAL_TABLET | Freq: Four times a day (QID) | ORAL | 0 refills | Status: DC | PRN

## 2018-06-15 MED ORDER — METHOCARBAMOL 500 MG PO TABS: 500.0000 mg | ORAL_TABLET | Freq: Four times a day (QID) | ORAL | 0 refills | Status: DC | PRN

## 2018-06-15 MED FILL — METHOCARBAMOL 500 MG TABLET: 500 | 10 days supply | Qty: 40 | Fill #0

## 2018-06-15 MED FILL — OXYCODONE-ACETAMINOPHEN 5-3: 5-325 | 3 days supply | Qty: 20 | Fill #0

## 2018-06-15 MED FILL — BIKTARVY 50-200-25 MG TABS: 50-200-25 | 30 days supply | Qty: 30 | Fill #0

## 2018-06-15 NOTE — Telephone Encounter (Signed)
Called back and given appt with Sandi Mealy, PA at 3 pm today. She verbalized understanding. Scheduling message sent.

## 2018-06-15 NOTE — Telephone Encounter (Signed)
Pt calling to report back pain which is NOT relieved by MS prescribed by Dr. Alvy Bimler. Conveyed to pt that Dr. Sondra Come was on vacation and that because Dr. Alvy Bimler prescribed the pain medication, it would be best that pt speak with Dr. Cathie Beams. Pt verbalized understanding. Nurse number given and pt transferred. Loma Sousa, RN BSN

## 2018-06-15 NOTE — Telephone Encounter (Signed)
Refill request for biktarvy received in Triage. Patient seen only once - initial visit. She has not had labs since starting [place]. She has complicated health history, is currently a patient at the cancer center.  SHe has been working with [facility] for medication access.  She is only able to come in Mondays and Tuesdays. RN sent refills, scheduled patient for blood work 8/13 10:45. Please confirm if it is ok to schedule her 8/26 4:00 (was Same Day slot). Landis Gandy, RN

## 2018-06-15 NOTE — Telephone Encounter (Signed)
She called and left a message. She went to the ER last night for pain and got a Rx for Percocet. She is still in pain and is requesting appt today with Dr. Alvy Bimler.

## 2018-06-15 NOTE — Patient Instructions (Signed)

## 2018-06-15 NOTE — Telephone Encounter (Signed)
I cannot work her in She needs to see Sandi Mealy

## 2018-06-16 LAB — URINE CULTURE

## 2018-06-18 NOTE — Progress Notes (Signed)
[facility]pt[facility] Progress Note   Misty Thomas [id number] [birth year] 52 y.o.  Misty Thomas is managed by Dr. [name]  Actively treated with chemotherapy/immunotherapy: no   Assessment: Plan:    Muscle cramp - Plan: methocarbamol (ROBAXIN) 500 MG tablet, oxyCODONE-acetaminophen (PERCOCET/ROXICET) 5-325 MG tablet  Malignant neoplasm of endocervix (HCC)   Muscle spasms of the lumbar paraspinous muscles: The patient was given a prescription for Robaxin 500 mg p.o. 4 times daily as needed and a prescription for Percocet 5-3 25 p.o. every 6 hours as needed, #20 with no refills.  Malignant neoplasm of the endocervix: Patient is status post radiation therapy under the care of Dr. Sondra Come.  She is scheduled to be seen by Dr. [name] in follow-up on 07/20/2018.  Please see After Visit Summary for patient specific instructions.  Future Appointments  Date Time Provider [place]  06/19/2018 10:45 AM [facility]  06/19/2018  2:45 PM [facility] [place] [facility] None  07/02/2018  4:00 PM Comer, Okey Regal, MD [facility]  07/20/2018  3:00 PM [name], MD [facility] None  07/31/2018  3:00 PM [facility] [place] [facility] None    No orders of the defined types were placed in this encounter.      Subjective:   Patient ID:  Misty Thomas is a 52 y.o. (DOB 08/04/1966) female.  Chief Complaint:  Chief Complaint  Patient presents with  . Back Pain    HPI Misty Thomas is a 51 year old female with a history of cervical cancer who is been treated with radiation therapy by Dr. Sondra Come.  She was seen in the emergency room at [facility] last night for back pain.  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  She is having lower back pain but denies weakness, headaches, dizziness, urinary or fecal incontinence, nausea, vomiting, diarrhea, constipation, shortness of breath, chest pain, or pain with  urination.  She denies any trauma.  Medications: I have reviewed the patient's current medications.  Allergies: No Known Allergies  Past Medical History:  Diagnosis Date  . Acquired pancytopenia ([place]) 03/2018  . Anemia    Mild  . Cancer associated pain   . 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 Chemotherapy induced nausea and vomiting   . Diabetes mellitus type 2, diet-controlled ([place])   . Frequency of urination   . History of cellulitis 2018   bilateral lower leg w/ mrsa  . History of external beam radiation therapy 02-05-2018  to 03-16-2018   cervical cancer  . History of MRSA infection 2018   w/ bilateral lower leg cellulitis  . HIV (human immunodeficiency virus infection) ([place])    asymptomatic  . Hypomagnesemia 03/2018   Severe---- takes oral magnesium and IV magnesium as needed (cancer center)  . Intermittent diarrhea    due to radiation/ chemo  . Nocturia   . Port-A-Cath in place    Power port  . Radiation burn    LOWER ABD.  04-06-2018  per is healing  . Subclinical hypothyroidism    w/ thyroiditis , dx 01-22-2018 PET scan  . Thrombocytopenia ([place])   . Wears glasses     Past Surgical History:  Procedure Laterality Date  .  EUA/ CERVICAL BX  01-04-2018   dr Ihor Dow  [facility]  . IR FLUORO GUIDE PORT INSERTION RIGHT  02/01/2018  . TANDEM RING INSERTION N/A 03/20/2018   Procedure: TANDEM RING INSERTION;  Surgeon: Gery Pray, MD;  Location: [facility];  Service: Urology;  Laterality: N/A;  . TANDEM RING INSERTION N/A 03/26/2018   Procedure: TANDEM RING INSERTION;  Surgeon: Gery Pray, MD;  Location: [facility];  Service: Urology;  Laterality: N/A;  . TANDEM RING INSERTION N/A 04/04/2018    Procedure: TANDEM RING INSERTION;  Surgeon: Gery Pray, MD;  Location: [facility];  Service: Urology;  Laterality: N/A;  . TANDEM RING INSERTION N/A 04/12/2018   Procedure: TANDEM RING INSERTION;  Surgeon: Gery Pray, MD;  Location: [facility];  Service: Urology;  Laterality: N/A;  . TANDEM RING INSERTION N/A 04/18/2018   Procedure: TANDEM RING INSERTION;  Surgeon: Gery Pray, MD;  Location: [facility];  Service: Urology;  Laterality: N/A;  . TUBAL LIGATION Bilateral 1990s    Family History  Problem Relation Age of Onset  . Cancer Maternal Grandmother        unknown ca    Social History   Socioeconomic History  . Marital status: Married    Spouse name: [name]  . Number of children: 3  . Years of education: Not on file  . Highest education level: Not on file  Occupational History  . Not on file  Social Needs  . Financial resource strain: Somewhat hard  . Food insecurity:    Worry: Sometimes true    Inability: Sometimes true  . Transportation needs:    Medical: No    Non-medical: No  Tobacco Use  . Smoking status: Never Smoker  . Smokeless tobacco: Never Used  Substance and Sexual Activity  . Alcohol use: Not Currently    Frequency: Never  . Drug use: No  . Sexual activity: Not Currently    Birth control/protection: Surgical  Lifestyle  . Physical activity:    Days per week: 0 days    Minutes per session: 0 min  . Stress: Rather much  Relationships  . Social connections:    Talks on phone: Once a week    Gets together: Once a week    Attends religious service: Never    Active member of club or organization: No    Attends meetings of clubs or organizations: Not on file    Relationship status: Married  . Intimate partner violence:    Fear of current or ex partner: Not on file    Emotionally abused: Not on file    Physically abused: Not on file    Forced sexual activity: Not on file  Other Topics Concern  .  Not on file  Social History Narrative  . Not on file    Past Medical History, Surgical history, Social history, and Family history were reviewed and updated as appropriate.   Please see review of systems for further details on the patient's review from today.   Review of Systems:  Review of Systems  Constitutional: Negative for chills, diaphoresis and fever.  HENT: Negative for trouble swallowing and voice change.   Respiratory: Negative for cough, chest tightness, shortness of breath and wheezing.   Cardiovascular: Negative for chest pain and palpitations.  Gastrointestinal: Negative for abdominal pain, constipation, diarrhea, nausea and vomiting.  Musculoskeletal: Positive for back pain and myalgias.  Neurological: Negative for dizziness, light-headedness and headaches.  Objective:   Physical Exam:  BP 137/71 (BP Location: Left Arm, Patient Position: Sitting)   Pulse 85   Temp 97.8 F (36.6 C) (Oral)   Resp 18   Ht 5\' 1"  (1.549 m)   Wt 217 lb 6.4 oz (98.6 kg)   SpO2 100%   BMI 41.08 kg/m  ECOG: 0  Physical Exam  Constitutional: No distress.  HENT:  Head: Normocephalic and atraumatic.  Mouth/Throat: No oropharyngeal exudate.  Neck: Normal range of motion. Neck supple.  Cardiovascular: Normal rate, regular rhythm and normal heart sounds. Exam reveals no gallop and no friction rub.  No murmur heard. Pulmonary/Chest: Effort normal and breath sounds normal. No respiratory distress. She has no wheezes. She has no rales.  Abdominal: Soft. Bowel sounds are normal. She exhibits no distension and no mass. There is no tenderness. There is no rebound and no guarding.  Musculoskeletal:       Arms: Lymphadenopathy:    She has no cervical adenopathy.  Neurological: She is alert. Coordination normal.  Skin: Skin is warm and dry. No rash noted. She is not diaphoretic. No erythema.  Psychiatric: She has a normal mood and affect. Her behavior is normal. Judgment and thought  content normal.    Lab Review:     Component Value Date/Time   NA 139 04/17/2018 1525   K 4.7 04/17/2018 1525   CL 106 04/17/2018 1525   CO2 24 04/17/2018 1525   GLUCOSE 160 (H) 04/17/2018 1525   BUN 21 (H) 04/17/2018 1525   CREATININE 1.37 (H) 04/17/2018 1525   CREATININE 1.21 (H) 02/19/2018 1616   CALCIUM 10.1 04/17/2018 1525   PROT 7.6 02/24/202019 0902   ALBUMIN 3.4 (L) 02/24/202019 0902   AST 17 02/24/202019 0902   ALT 16 02/24/202019 0902   ALKPHOS 78 02/24/202019 0902   BILITOT <0.2 (L) 02/24/202019 0902   GFRNONAA 43 (L) 04/17/2018 1525   GFRNONAA 52 (L) 02/19/2018 1616   GFRAA 50 (L) 04/17/2018 1525   GFRAA 60 02/19/2018 1616       Component Value Date/Time   WBC 4.1 04/17/2018 1525   RBC 3.28 (L) 04/17/2018 1525   HGB 10.1 (L) 04/17/2018 1525   HCT 30.8 (L) 04/17/2018 1525   PLT 377 04/17/2018 1525   MCV 93.9 04/17/2018 1525   MCH 30.8 04/17/2018 1525   MCHC 32.8 04/17/2018 1525   RDW 18.7 (H) 04/17/2018 1525   LYMPHSABS 0.8 04/17/2018 1525   MONOABS 0.4 04/17/2018 1525   EOSABS 0.0 04/17/2018 1525   BASOSABS 0.0 04/17/2018 1525   -------------------------------  Imaging from last 24 hours (if applicable):  Radiology interpretation: No results found.

## 2018-06-19 ENCOUNTER — Other Ambulatory Visit: Payer: BLUE CROSS/BLUE SHIELD

## 2018-06-19 ENCOUNTER — Inpatient Hospital Stay: Payer: BLUE CROSS/BLUE SHIELD

## 2018-06-19 DIAGNOSIS — C530 Malignant neoplasm of endocervix: Secondary | ICD-10-CM | POA: Diagnosis not present

## 2018-06-19 DIAGNOSIS — Z21 Asymptomatic human immunodeficiency virus [HIV] infection status: Secondary | ICD-10-CM

## 2018-06-19 MED ORDER — ALTEPLASE 2 MG IJ SOLR
2.0000 mg | Freq: Once | INTRAMUSCULAR | Status: AC
  Administered 2018-06-19: 2 mg
  Filled 2018-06-19: qty 2

## 2018-06-19 MED ORDER — ALTEPLASE 2 MG IJ SOLR
INTRAMUSCULAR | Status: AC
  Filled 2018-06-19: qty 2

## 2018-06-19 MED ORDER — HEPARIN SOD (PORK) LOCK FLUSH 100 UNIT/ML IV SOLN
500.0000 [IU] | Freq: Once | INTRAVENOUS | Status: AC
  Administered 2018-06-19: 500 [IU]
  Filled 2018-06-19: qty 5

## 2018-06-19 MED ORDER — SODIUM CHLORIDE 0.9% FLUSH
10.0000 mL | Freq: Once | INTRAVENOUS | Status: AC
  Administered 2018-06-19: 10 mL
  Filled 2018-06-19: qty 10

## 2018-06-20 LAB — T-HELPER CELL (CD4) - (RCID CLINIC ONLY)
CD4 % Helper T Cell: 18 % — ABNORMAL LOW (ref 33–55)
CD4 T CELL ABS: 130 /uL — AB (ref 400–2700)

## 2018-06-21 LAB — COMPLETE METABOLIC PANEL WITH GFR
AG RATIO: 1.1 (calc) (ref 1.0–2.5)
ALBUMIN MSPROF: 4.2 g/dL (ref 3.6–5.1)
ALKALINE PHOSPHATASE (APISO): 58 U/L (ref 33–130)
ALT: 13 U/L (ref 6–29)
AST: 20 U/L (ref 10–35)
BILIRUBIN TOTAL: 0.3 mg/dL (ref 0.2–1.2)
BUN / CREAT RATIO: 15 (calc) (ref 6–22)
BUN: 19 mg/dL (ref 7–25)
CO2: 23 mmol/L (ref 20–32)
Calcium: 9.8 mg/dL (ref 8.6–10.4)
Chloride: 104 mmol/L (ref 98–110)
Creat: 1.3 mg/dL — ABNORMAL HIGH (ref 0.50–1.05)
GFR, Est African American: 55 mL/min/{1.73_m2} — ABNORMAL LOW (ref >=60)
GFR, Est Non African American: 47 mL/min/{1.73_m2} — ABNORMAL LOW (ref >=60)
GLOBULIN: 3.9 g/dL — AB (ref 1.9–3.7)
Glucose, Bld: 103 mg/dL — ABNORMAL HIGH (ref 65–99)
POTASSIUM: 4.4 mmol/L (ref 3.5–5.3)
SODIUM: 136 mmol/L (ref 135–146)
Total Protein: 8.1 g/dL (ref 6.1–8.1)

## 2018-06-21 LAB — HIV-1 RNA QUANT-NO REFLEX-BLD
HIV 1 RNA Quant: <20 copies/mL — AB
HIV-1 RNA Quant, Log: <1.3 Log copies/mL — AB

## 2018-06-25 NOTE — Telephone Encounter (Signed)
Thanks, labs look good.

## 2018-06-26 ENCOUNTER — Telehealth: Payer: Self-pay | Admitting: Oncology

## 2018-06-26 ENCOUNTER — Telehealth: Payer: Self-pay | Admitting: *Deleted

## 2018-06-26 ENCOUNTER — Other Ambulatory Visit: Payer: Self-pay

## 2018-06-26 MED ORDER — MORPHINE SULFATE 15 MG PO TABS: 15.0000 mg | ORAL_TABLET | Freq: Four times a day (QID) | ORAL | 0 refills | Status: DC | PRN

## 2018-06-26 NOTE — Telephone Encounter (Signed)
CALLED PATIENT TO INFORM OF FU APPT. FOR 06-28-18 @ 9:30 AM, PATIENT AGREED TO TIME AND DATE.

## 2018-06-26 NOTE — Telephone Encounter (Signed)
Called and given below message. Morphine Rx ready for pickup. She verbalized understanding. She will wait and be evaluated by Dr. Sondra Come on Thursday 8/22 regarding earlier PET scan. Instructed to call the office if needed.

## 2018-06-26 NOTE — Telephone Encounter (Signed)
Percocet is only 5 mg. Morphine is 15 mg (triple the dose). Does not make sense why it is better. She can tylenol plus morphine I can refill her prescription and try to move her imaging to be done sooner

## 2018-06-26 NOTE — Telephone Encounter (Signed)
Patient called and asked for refills on Percocet and Robaxin last filled on 06/15/18 by Sandi Mealy, PA.  Patient was crying and said she is having lower back pain and is out of all her pain medication.  She did mention that the Percocet helped relieve the pain better than morphine. She said the pain is constant and started at the end of July.  She denies having any dysuria, hematuria or vaginal bleeding.

## 2018-06-28 ENCOUNTER — Ambulatory Visit
Admission: RE | Admit: 2018-06-28 | Discharge: 2018-06-28 | Disposition: A | Discharge status: Home / Self Care | Payer: BLUE CROSS/BLUE SHIELD | Source: Ambulatory Visit | Attending: Radiation Oncology | Admitting: Radiation Oncology

## 2018-06-28 ENCOUNTER — Other Ambulatory Visit: Payer: Self-pay

## 2018-06-28 ENCOUNTER — Encounter: Payer: Self-pay | Admitting: Radiation Oncology

## 2018-06-28 ENCOUNTER — Telehealth: Payer: Self-pay | Admitting: *Deleted

## 2018-06-28 VITALS — BP 109/66 | HR 80 | Temp 98.1°F | Resp 20 | Ht 61.0 in | Wt 206.4 lb

## 2018-06-28 DIAGNOSIS — C530 Malignant neoplasm of endocervix: Secondary | ICD-10-CM | POA: Insufficient documentation

## 2018-06-28 DIAGNOSIS — R252 Cramp and spasm: Secondary | ICD-10-CM | POA: Diagnosis not present

## 2018-06-28 DIAGNOSIS — Z79899 Other long term (current) drug therapy: Secondary | ICD-10-CM | POA: Insufficient documentation

## 2018-06-28 DIAGNOSIS — Z79890 Hormone replacement therapy (postmenopausal): Secondary | ICD-10-CM | POA: Diagnosis not present

## 2018-06-28 DIAGNOSIS — G893 Neoplasm related pain (acute) (chronic): Secondary | ICD-10-CM | POA: Insufficient documentation

## 2018-06-28 MED ORDER — METHOCARBAMOL 500 MG PO TABS: 500.0000 mg | ORAL_TABLET | Freq: Four times a day (QID) | ORAL | 0 refills | Status: DC | PRN

## 2018-06-28 MED ORDER — OXYCODONE-ACETAMINOPHEN 5-325 MG PO TABS: 1.0000 | ORAL_TABLET | ORAL | 0 refills | Status: DC | PRN

## 2018-06-28 MED FILL — OXYCODONE-ACETAMINOPHEN 5-3: 5-325 | 5 days supply | Qty: 30 | Fill #0

## 2018-06-28 MED FILL — METHOCARBAMOL 500 MG TABLET: 500 | 10 days supply | Qty: 40 | Fill #0

## 2018-06-28 MED FILL — MORPHINE SULFATE IR 15 MG T: 15 | 15 days supply | Qty: 60 | Fill #0

## 2018-06-28 NOTE — Progress Notes (Signed)
Small and Medium Dilator given   Home Care Instructions for the Insertion and Care of Your Vaginal Dilator  Why Do I Need a Vaginal Dilator?  Internal radiation therapy may cause scar tissue to form at the top of your vagina (vaginal cuff).  This may make vaginal examinations difficult in the future. You can prevent scar tissue from forming by using a vaginal dilator (a smooth plastic rod), and/or by having regular sexual intercourse.  If not using the dilator you should be having intercourse two or three times a week.  If you are unable to have intercourse, you should use your vaginal dilator.  You may have some spotting or bleeding from your dilator or intercourse the first few times. You may also have some discomfort. If discomfort occurs with intercourse, you and your partner may need to stop for a while and try again later.  How to Use Your Vaginal Dilator  - Wash the dilator with soap and water before and after each use. - Check the dilator to be sure it is smooth. Do not use the dilator if you find any roughspots. - Coat the dilator with K-Y Jelly, Astroglide, or Replens. Do not use Vaseline, baby oil, or other oil based lubricants. They are not water-soluble and can be irritating to the tissues in the vagina. - Lie on your back with your knees bent and legs apart. - Insert the rounded end of the dilator into your vagina as far as it will go without causing pain or discomfort. - Close your knees and slowly straighten your legs. - Keep the dilator in your vagina for about 10 to 15 minutes.  Please use 3 times a week, for example: Monday, Wednesday and Friday evenings. [facility] your knees, open your legs, and gently remove the dilator. - Gently cleanse the skin around the vaginal opening. - Wash the dilator after each use. -  It is important that you use the dilator routinely until instructed otherwise by your doctor.  Instructions given today. Pt verbalized understanding

## 2018-06-28 NOTE — Progress Notes (Signed)
Radiation Oncology         [phone] ________________________________  Name: Misty Thomas MRN: [id number]  Date: 06/28/2018  DOB: Feb 26, 1966  Follow-Up Visit Note  CC: Patient, No Pcp Per  Everitt Amber, MD    ICD-10-CM   1. Malignant neoplasm of endocervix ([place]) C53.0   2. Muscle cramp R25.2 oxyCODONE-acetaminophen (PERCOCET/ROXICET) 5-325 MG tablet    methocarbamol (ROBAXIN) 500 MG tablet    Diagnosis:   Stage IIIB, Squamous Cell Carcinoma of the Cervix  Interval Since Last Radiation:  2 months 02/05/18 - 03/16/18: IMRT to pelvis / 45 Gy in 25 fractions; sidewall boost / 9 Gy in 5 fractions 03/20/18 - 04/18/18: brachytherapy (tandem ring) to cervix / 27.5 Gy in 5 fractions  Narrative:  The patient returns today for routine follow-up. For some reason, she was not given a one month follow up following her last HDR therapy. Her main concern is a constant lower back pain that began after she started back to work.   She reports dull, constant lower back pain and mild fatigue. She denies vaginal or rectal bleeding, nausea, vomiting, urgency or frequency, dysuria or hematuria, and skin irritation.  Patient does a lot ofbending and lifting at work which exacerbates her back pain.    ALLERGIES:  has No Known Allergies.  Meds: Current Outpatient Medications  Medication Sig Dispense Refill  . acetaminophen (TYLENOL) 500 MG tablet Take 500 mg by mouth every 6 (six) hours as needed.    Marland Kitchen BIKTARVY 50-200-25 MG TABS tablet TAKE 1 TABLET BY MOUTH DAILY. 30 tablet 2  . docusate sodium (COLACE) 100 MG capsule Take 100 mg by mouth every morning.     Marland Kitchen levothyroxine (SYNTHROID, LEVOTHROID) 25 MCG tablet Take 1 tablet (25 mcg total) by mouth daily before breakfast. 30 tablet 1  . magnesium oxide (MAG-OX) 400 (241.3 Mg) MG tablet Take 1 tablet (400 mg total) by mouth 2 (two) times daily. 60 tablet 3  . Misc Natural Products (ESTROVEN + ENERGY MAX STRENGTH PO) Take 1 tablet by mouth daily.    Marland Kitchen  morphine (MSIR) 15 MG tablet Take 1 tablet (15 mg total) by mouth every 6 (six) hours as needed for severe pain. 60 tablet 0  . ondansetron (ZOFRAN) 4 MG tablet Take 1 tablet (4 mg total) by mouth every 8 (eight) hours as needed for nausea or vomiting. 20 tablet 0  . oxyCODONE-acetaminophen (PERCOCET/ROXICET) 5-325 MG tablet Take 1 tablet by mouth every 4 (four) hours as needed for severe pain. 30 tablet 0  . sulfamethoxazole-trimethoprim (BACTRIM DS,SEPTRA DS) 800-160 MG tablet Take 1 tablet by mouth 2 (two) times daily. 14 tablet 1  . methocarbamol (ROBAXIN) 500 MG tablet Take 1 tablet (500 mg total) by mouth every 6 (six) hours as needed for muscle spasms. 40 tablet 0   No current facility-administered medications for this encounter.     Physical Findings: The patient is in no acute distress. Patient is alert and oriented.  height is 5\' 1"  (1.549 m) and weight is 206 lb 6.4 oz (93.6 kg). Her oral temperature is 98.1 F (36.7 C). Her blood pressure is 109/66 and her pulse is 80. Her respiration is 20 and oxygen saturation is 100%.   Lungs are clear to auscultation bilaterally. Heart has regular rate and rhythm. No palpable cervical, supraclavicular, or axillary adenopathy. Abdomen soft, non-tender, normal bowel sounds. She shows no weaknesses in her lower extremities. Patient is non-tender upon palpation along the lower back and sacral region.  Pelvic exam deferred in light of recent treatment completion.   Lab Findings: Lab Results  Component Value Date   WBC 4.1 04/17/2018   HGB 10.1 (L) 04/17/2018   HCT 30.8 (L) 04/17/2018   MCV 93.9 04/17/2018   PLT 377 04/17/2018    Radiographic Findings: No results found.  Impression:  Stage IIIB, Squamous Cell Carcinoma of the Cervix The patient is recovering from the effects of radiation. The patient has developed back pain since completing therapy, but this all started when she returned to work. The patient reports this pain is different from  her cancer-related pain prior to treatment. She was given muscle relaxants and percocet, which have helped her back pain. She's taking muscle relaxants and ibuprofen during the day and percocet at night. I spoke with the patient about a lumbar spine MRI, which she decided to hold off on. I didn't want to move up her PET scan, as this may give [name] a false reading regarding her cervical cancer.  Plan:  Patient was given a vaginal dilator and instructions about its use. She was given a refill on her percocet and muscle relaxants. Patient will follow up with Dr. Denman George and Dr. Alvy Bimler in September following her PET scan. Patient will follow up in radiation oncology in December.   __________________________________ -----------------------------------  Blair Promise, PhD, MD  This document serves as a record of services personally performed by Gery Pray, MD. It was created on his behalf by Wilburn Mylar, a trained medical scribe. The creation of this record is based on the scribe's personal observations and the provider's statements to them. This document has been checked and approved by the attending provider.

## 2018-06-28 NOTE — Progress Notes (Signed)
Pt here for a follow-up for cervical cancer. Pt states that she has a dull constant back pain that is a 3 out of 5 on the pain scale. Pt states that mild fatigue. Pt denies having diarrhea. Pt denies having nausea or vomiting. Pt denies having any vaginal or rectal bleeding. Pt denies having vaginal discharge. Pt denies having any urgency, frequency, dysuria or hematuria. Pt denies having any skin irritation.  BP 109/66 (BP Location: Left Arm, Patient Position: Sitting)   Pulse 80   Temp 98.1 F (36.7 C) (Oral)   Resp 20   Ht 5\' 1"  (1.549 m)   Wt 206 lb 6.4 oz (93.6 kg)   SpO2 100%   BMI 39.00 kg/m   Wt Readings from Last 3 Encounters:  06/28/18 206 lb 6.4 oz (93.6 kg)  06/15/18 217 lb 6.4 oz (98.6 kg)  06/14/18 217 lb (98.4 kg)

## 2018-06-28 NOTE — Telephone Encounter (Signed)
CALLED PATIENT TO INFORM OF APPT. WITH DR. Denman George ON 08-06-18 - ARRIVAL TIME - 10:45 AM, LVM FOR A RETURN CALL

## 2018-07-02 ENCOUNTER — Ambulatory Visit (INDEPENDENT_AMBULATORY_CARE_PROVIDER_SITE_OTHER): Payer: BLUE CROSS/BLUE SHIELD | Admitting: Internal Medicine

## 2018-07-02 ENCOUNTER — Encounter: Payer: Self-pay | Admitting: Internal Medicine

## 2018-07-02 VITALS — BP 120/77 | HR 75 | Temp 97.9°F | Ht 61.0 in | Wt 208.0 lb

## 2018-07-02 DIAGNOSIS — Z7189 Other specified counseling: Secondary | ICD-10-CM | POA: Diagnosis not present

## 2018-07-02 DIAGNOSIS — Z113 Encounter for screening for infections with a predominantly sexual mode of transmission: Secondary | ICD-10-CM | POA: Diagnosis not present

## 2018-07-02 DIAGNOSIS — Z7185 Encounter for immunization safety counseling: Secondary | ICD-10-CM | POA: Insufficient documentation

## 2018-07-02 DIAGNOSIS — Z21 Asymptomatic human immunodeficiency virus [HIV] infection status: Secondary | ICD-10-CM | POA: Diagnosis not present

## 2018-07-02 NOTE — Assessment & Plan Note (Signed)
Doing well with this.  I will have her return in 6 months.  CD4 low but suppressed virus c/w effect from chemotherapy.

## 2018-07-02 NOTE — Assessment & Plan Note (Signed)
Will defer vaccines now due to chemo.   Will need hepatitis A and B, pneumovax/Prevnar

## 2018-07-02 NOTE — Progress Notes (Signed)
   Subjective:    Patient ID: Misty Thomas, female    DOB: [birth year], 52 y.o.   MRN: [id number]  HPI Here for follow up of HIV She was newly diagnosed earlier this year and started on Biktarvy.  She also is being treated for cervical cancer by Dr. Lurlean Leyden and Sondra Come and getting chemotherapy and radiation.  No issues with her HIV and CD4 130 in the setting of above and viral load <20.  No missed doses.    Review of Systems  Constitutional: Positive for fatigue. Negative for unexpected weight change.  Musculoskeletal: Positive for arthralgias and back pain.       Objective:   Physical Exam  Constitutional: She appears well-developed and well-nourished. No distress.  HENT:  Mouth/Throat: No oropharyngeal exudate.  Eyes: No scleral icterus.  Cardiovascular: Normal rate, regular rhythm and normal heart sounds.  No murmur heard. Pulmonary/Chest: Effort normal and breath sounds normal. No respiratory distress.  Skin: No rash noted.          Assessment & Plan:

## 2018-07-11 ENCOUNTER — Ambulatory Visit (HOSPITAL_COMMUNITY)
Admission: RE | Admit: 2018-07-11 | Discharge: 2018-07-11 | Disposition: A | Discharge status: Home / Self Care | Payer: BLUE CROSS/BLUE SHIELD | Source: Ambulatory Visit | Attending: Hematology and Oncology | Admitting: Hematology and Oncology

## 2018-07-11 DIAGNOSIS — E069 Thyroiditis, unspecified: Secondary | ICD-10-CM | POA: Insufficient documentation

## 2018-07-11 DIAGNOSIS — N1330 Unspecified hydronephrosis: Secondary | ICD-10-CM | POA: Diagnosis not present

## 2018-07-11 DIAGNOSIS — D259 Leiomyoma of uterus, unspecified: Secondary | ICD-10-CM | POA: Diagnosis not present

## 2018-07-11 DIAGNOSIS — R591 Generalized enlarged lymph nodes: Secondary | ICD-10-CM | POA: Diagnosis not present

## 2018-07-11 DIAGNOSIS — K760 Fatty (change of) liver, not elsewhere classified: Secondary | ICD-10-CM | POA: Insufficient documentation

## 2018-07-11 DIAGNOSIS — C530 Malignant neoplasm of endocervix: Secondary | ICD-10-CM | POA: Insufficient documentation

## 2018-07-11 LAB — GLUCOSE, CAPILLARY: Glucose-Capillary: 104 mg/dL — ABNORMAL HIGH (ref 70–99)

## 2018-07-11 MED ORDER — FLUDEOXYGLUCOSE F - 18 (FDG) INJECTION
10.4000 | Freq: Once | INTRAVENOUS | Status: AC | PRN
  Administered 2018-07-11: 10.4 via INTRAVENOUS

## 2018-07-12 ENCOUNTER — Telehealth: Payer: Self-pay | Admitting: Oncology

## 2018-07-12 NOTE — Telephone Encounter (Signed)
Called Misty Thomas and asked if she needs a refill on pain medication.  She said the pain is better and believes it was coming from her rectal area.  She said she has noticed that her stools are larger in diameter.  She is taking morphine occasionally and Robaxin about 3 times a day.  She said she does need a refill on Robaxin.  Asked if she would like to move up her appointment with Dr. Alvy Bimler next week and she said she no that 9/13 will be OK.  Advised her to call if she needs anything before then.

## 2018-07-16 MED FILL — BIKTARVY 50-200-25 MG TABS: 50-200-25 | 30 days supply | Qty: 30 | Fill #1

## 2018-07-20 ENCOUNTER — Inpatient Hospital Stay: Payer: BLUE CROSS/BLUE SHIELD | Attending: Gynecologic Oncology | Admitting: Hematology and Oncology

## 2018-07-20 ENCOUNTER — Encounter: Payer: Self-pay | Admitting: Hematology and Oncology

## 2018-07-20 ENCOUNTER — Telehealth: Payer: Self-pay | Admitting: Hematology and Oncology

## 2018-07-20 VITALS — BP 130/74 | HR 75 | Temp 98.1°F | Resp 18 | Ht 61.0 in | Wt 216.6 lb

## 2018-07-20 DIAGNOSIS — Z299 Encounter for prophylactic measures, unspecified: Secondary | ICD-10-CM

## 2018-07-20 DIAGNOSIS — Z9221 Personal history of antineoplastic chemotherapy: Secondary | ICD-10-CM | POA: Insufficient documentation

## 2018-07-20 DIAGNOSIS — Z23 Encounter for immunization: Secondary | ICD-10-CM | POA: Insufficient documentation

## 2018-07-20 DIAGNOSIS — K5909 Other constipation: Secondary | ICD-10-CM | POA: Insufficient documentation

## 2018-07-20 DIAGNOSIS — B20 Human immunodeficiency virus [HIV] disease: Secondary | ICD-10-CM | POA: Diagnosis not present

## 2018-07-20 DIAGNOSIS — E119 Type 2 diabetes mellitus without complications: Secondary | ICD-10-CM | POA: Diagnosis not present

## 2018-07-20 DIAGNOSIS — E039 Hypothyroidism, unspecified: Secondary | ICD-10-CM

## 2018-07-20 DIAGNOSIS — C530 Malignant neoplasm of endocervix: Secondary | ICD-10-CM

## 2018-07-20 DIAGNOSIS — G893 Neoplasm related pain (acute) (chronic): Secondary | ICD-10-CM | POA: Diagnosis not present

## 2018-07-20 DIAGNOSIS — Z21 Asymptomatic human immunodeficiency virus [HIV] infection status: Secondary | ICD-10-CM

## 2018-07-20 DIAGNOSIS — Z923 Personal history of irradiation: Secondary | ICD-10-CM | POA: Insufficient documentation

## 2018-07-20 MED ORDER — INFLUENZA VAC SPLIT QUAD 0.5 ML IM SUSY
0.5000 mL | PREFILLED_SYRINGE | Freq: Once | INTRAMUSCULAR | Status: AC
  Administered 2018-07-20: 0.5 mL via INTRAMUSCULAR

## 2018-07-20 MED ORDER — MORPHINE SULFATE 15 MG PO TABS: 15.0000 mg | ORAL_TABLET | Freq: Four times a day (QID) | ORAL | 0 refills | Status: DC | PRN

## 2018-07-20 NOTE — Telephone Encounter (Signed)
Gave patient avs report and appointments for September and November.

## 2018-07-20 NOTE — Assessment & Plan Note (Signed)
She is receiving antiretroviral treatment as directed by infectious disease team

## 2018-07-20 NOTE — Assessment & Plan Note (Signed)
I have reviewed PET CT scan with the patient and her husband She has significant positive response to treatment but residual disease cannot be excluded I recommend continue supportive care and repeat imaging again in 3 months I plan to see her back in 6 weeks for further follow-up

## 2018-07-20 NOTE — Assessment & Plan Note (Signed)
We discussed the importance of preventive care and reviewed the vaccination programs. She does not have any prior allergic reactions to influenza vaccination. She agrees to proceed with influenza vaccination today and we will administer it today at the clinic.  

## 2018-07-20 NOTE — Assessment & Plan Note (Addendum)
We have extensive discussion about chronic pain management We discussed medication taper and narcotic refill policy I'm willing to manage her chronic pain with IR morphine I warned her about side effects of nausea and constipation

## 2018-07-20 NOTE — Progress Notes (Signed)
[facility] OFFICE PROGRESS NOTE  Patient Care Team: Default, Provider, MD as PCP - General  ASSESSMENT & PLAN:  Cervical cancer ([place]) I have reviewed PET CT scan with the patient and her husband She has significant positive response to treatment but residual disease cannot be excluded I recommend continue supportive care and repeat imaging again in 3 months I plan to see her back in 6 weeks for further follow-up  HIV (human immunodeficiency virus infection) ([place]) She is receiving antiretroviral treatment as directed by infectious disease team  Cancer associated pain We have extensive discussion about chronic pain management We discussed medication taper and narcotic refill policy I'm willing to manage her chronic pain with IR morphine I warned her about side effects of nausea and constipation  Other constipation We discussed laxatives  Preventive measure We discussed the importance of preventive care and reviewed the vaccination programs. She does not have any prior allergic reactions to influenza vaccination. She agrees to proceed with influenza vaccination today and we will administer it today at the clinic.    Orders Placed This Encounter  Procedures  . Comprehensive metabolic panel    Standing Status:   Standing    Number of Occurrences:   9    Standing Expiration Date:   07/21/2019  . Magnesium    Standing Status:   Standing    Number of Occurrences:   9    Standing Expiration Date:   07/21/2019    INTERVAL HISTORY: Please see below for problem oriented charting. She returns with her husband to review test results She has persistent pelvic pain requiring regular pain medications Denies vaginal DC; She has mild nausea and constipation  SUMMARY OF ONCOLOGIC HISTORY:   Cervical cancer ([place])   01/04/2017 Initial Diagnosis    She went to urgent care service for evaluation of abnormal vaginal symptoms with discharge and was diagnosed with bacterial  vaginosis    01/03/2018 Imaging    1. Complex thick-walled fluid collection/abscess the lower pelvis with possible communication to the lower uterine/vagina. Clinical correlation and further evaluation with pelvic ultrasound recommended. CT with IV and delayed oral and rectal contrast may provide additional information. A smaller collection is also noted in the left posterior hemipelvis the left of the rectum. 2. No bowel obstruction or active inflammation.  Normal appendix. Please note the described 5.0 x 5.5 cm complex collection within the pelvis appears to be in the region of the cervix and may represent a necrotic mass/neoplasm. The described 3 x 3 cm low attenuating lesion to the left of the rectum may represent an abnormal lymph node.     01/04/2018 Pathology Results    Cervix, biopsy - POORLY DIFFERENTIATED CARCINOMA - SEE COMMENT Microscopic Comment The biopsy material consists of multiple fragments of tissue with infiltrative tumor and granulation tissue. By immunohistochemistry, the tumor is positive for cytokeratin 5/6, p63, p16 and vimentin (diffusely positive) but negative for ER. 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    01/04/2018 Imaging    Pelvic US 1. Heterogeneous area of irregularity with Doppler flow in the region of the cervix. A cervical mass is not excluded. Clinical correlation is recommended.  2. Rounded complex mass/collection posterior to the lower uterus/vagina corresponding to the larger complex collection seen on the earlier CT. This may represent a necrotic or purulent collection. 3. Grossly unremarkable left ovary. Nonvisualization the right ovary.    01/04/2018 Procedure    She underwent examination under  anesthesia Procedure: pt taken to the operating room where gen anesthesia was performed. She was placed in the dorsal lithotomy position and prepped and draped in the usual sterile fashion. A bivalve speculum  was placed in the pts vagina and a macerated cervix was noted. There was very little that resembled a normal cervix. The tissue was necrotic and malodorous.  Several biopsies were obtained. There was actually very little bleeding from the biopsy sites although there was bleeding from within the uterine cavity above the location of the cervix.       01/22/2018 PET scan    1. Large hypermetabolic cervical mass with local invasion into the perirectal space. Bilateral pelvic adenopathy noted with small but hypermetabolic pelvic lymph nodes compatible with malignancy. I do not see definite hypermetabolic adenopathy in the upper abdomen, but there are scattered small bilateral hypermetabolic lymph nodes in the neck and axillary regions of uncertain significance. Given the low level activity and the skipped region in the abdomen, it is possible that the neck and chest lymph nodes are simply reactive, as usually I would expect to see more upper abdominal adenopathy were there involvement in the neck and chest. Surveillance is probably warranted. 2. Bilateral mild diffuse thyroid activity suggesting thyroiditis. 3. Other imaging findings of potential clinical significance: Aortic Atherosclerosis (ICD10-I70.0). Mild cardiomegaly. Bilateral iliac artery atherosclerosis. Suspected uterine fundal fibroid.     01/25/2018 Cancer Staging    Staging form: Cervix Uteri, AJCC 8th Edition - Clinical: FIGO Stage IVA (cT4, cN1, cM0) - Signed by [name], MD on 01/25/2018    02/01/2018 Procedure    Successful 8 French right internal jugular vein power port placement with its tip at the SVC/RA junction.    02/05/2018 - 04/18/2018 Radiation Therapy    Radiation treatment dates:   External therapy: 02/05/2018-03/16/2018, Brachytherapy: 03/20/2018, 03/26/2018, 04/04/2018, 04/12/2018, 04/18/2018  Site/dose:   1. Cervix/pelvis, 1.8 Gy in 25 fractions for a total dose of 45 Gy                      2. Sidewall/ parametrial Boost, 1.8  Gy in 5 fractions for a total dose of 9 Gy                      3. cervix, 5.5 Gy/fx,  5 fractions for a total dose of 27.5 Gy  Beams/energy:   1. 3D, 15X                              2. 3D, 15X                              3. HDR, Ir-192, tandem ring system     02/09/2018 - 03/09/2018 Chemotherapy    The patient had weekly cisplatin    07/12/2018 PET scan    Significant response to therapy with decreased hypermetabolic cervical soft tissue mass, left perirectal/presacral soft tissue density, and bilateral iliac lymphadenopathy. No new or progressive metastatic disease identified.  Increased mild right hydronephrosis noted.  Stable thyroiditis, hepatic steatosis, and small uterine fibroid     REVIEW OF SYSTEMS:   Constitutional: Denies fevers, chills or abnormal weight loss Eyes: Denies blurriness of vision Ears, nose, mouth, throat, and face: Denies mucositis or sore throat Respiratory: Denies cough, dyspnea or wheezes Cardiovascular: Denies palpitation, chest discomfort or lower extremity swelling Skin: Denies  abnormal skin rashes Lymphatics: Denies new lymphadenopathy or easy bruising Neurological:Denies numbness, tingling or new weaknesses Behavioral/Psych: Mood is stable, no new changes  All other systems were reviewed with the patient and are negative.  I have reviewed the past medical history, past surgical history, social history and family history with the patient and they are unchanged from previous note.  ALLERGIES:  has No Known Allergies.  MEDICATIONS:  Current Outpatient Medications  Medication Sig Dispense Refill  . acetaminophen (TYLENOL) 500 MG tablet Take 500 mg by mouth every 6 (six) hours as needed.    Marland Kitchen BIKTARVY 50-200-25 MG TABS tablet TAKE 1 TABLET BY MOUTH DAILY. 30 tablet 2  . docusate sodium (COLACE) 100 MG capsule Take 100 mg by mouth every morning.     Marland Kitchen levothyroxine (SYNTHROID, LEVOTHROID) 25 MCG tablet Take 1 tablet (25 mcg total) by mouth daily  before breakfast. 30 tablet 1  . magnesium oxide (MAG-OX) 400 (241.3 Mg) MG tablet Take 1 tablet (400 mg total) by mouth 2 (two) times daily. 60 tablet 3  . Misc Natural Products (ESTROVEN + ENERGY MAX STRENGTH PO) Take 1 tablet by mouth daily.    Marland Kitchen morphine (MSIR) 15 MG tablet Take 1 tablet (15 mg total) by mouth every 6 (six) hours as needed for severe pain. 60 tablet 0  . ondansetron (ZOFRAN) 4 MG tablet Take 1 tablet (4 mg total) by mouth every 8 (eight) hours as needed for nausea or vomiting. 20 tablet 0   No current facility-administered medications for this visit.     PHYSICAL EXAMINATION: ECOG PERFORMANCE STATUS: 1 - Symptomatic but completely ambulatory  Vitals:   07/20/18 1518  BP: 130/74  Pulse: 75  Resp: 18  Temp: 98.1 F (36.7 C)  SpO2: 100%   Filed Weights   07/20/18 1518  Weight: 216 lb 9.6 oz (98.2 kg)    GENERAL:alert, no distress and comfortable SKIN: skin color, texture, turgor are normal, no rashes or significant lesions EYES: normal, Conjunctiva are pink and non-injected, sclera clear OROPHARYNX:no exudate, no erythema and lips, buccal mucosa, and tongue normal  NECK: supple, thyroid normal size, non-tender, without nodularity LYMPH:  no palpable lymphadenopathy in the cervical, axillary or inguinal LUNGS: clear to auscultation and percussion with normal breathing effort HEART: regular rate & rhythm and no murmurs and no lower extremity edema ABDOMEN:abdomen soft, non-tender and normal bowel sounds Musculoskeletal:no cyanosis of digits and no clubbing  NEURO: alert & oriented x 3 with fluent speech, no focal motor/sensory deficits  LABORATORY DATA:  I have reviewed the data as listed    Component Value Date/Time   NA 136 06/19/2018 1155   K 4.4 06/19/2018 1155   CL 104 06/19/2018 1155   CO2 23 06/19/2018 1155   GLUCOSE 103 (H) 06/19/2018 1155   BUN 19 06/19/2018 1155   CREATININE 1.30 (H) 06/19/2018 1155   CALCIUM 9.8 06/19/2018 1155   PROT 8.1  06/19/2018 1155   ALBUMIN 3.4 (L) March 10, 202019 0902   AST 20 06/19/2018 1155   ALT 13 06/19/2018 1155   ALKPHOS 78 March 10, 202019 0902   BILITOT 0.3 06/19/2018 1155   GFRNONAA 47 (L) 06/19/2018 1155   GFRAA 55 (L) 06/19/2018 1155    No results found for: SPEP, UPEP  Lab Results  Component Value Date   WBC 4.1 04/17/2018   NEUTROABS 2.9 04/17/2018   HGB 10.1 (L) 04/17/2018   HCT 30.8 (L) 04/17/2018   MCV 93.9 04/17/2018   PLT 377 04/17/2018  Chemistry      Component Value Date/Time   NA 136 06/19/2018 1155   K 4.4 06/19/2018 1155   CL 104 06/19/2018 1155   CO2 23 06/19/2018 1155   BUN 19 06/19/2018 1155   CREATININE 1.30 (H) 06/19/2018 1155      Component Value Date/Time   CALCIUM 9.8 06/19/2018 1155   ALKPHOS 78 2020-09-1318 0902   AST 20 06/19/2018 1155   ALT 13 06/19/2018 1155   BILITOT 0.3 06/19/2018 1155       RADIOGRAPHIC STUDIES: I have personally reviewed the radiological images as listed and agreed with the findings in the report. Nm Pet Image Restag (ps) Skull Base To Thigh  Result Date: 07/11/2018 CLINICAL DATA:  Subsequent treatment strategy for cervical carcinoma. Status post chemotherapy and radiation therapy. EXAM: NUCLEAR MEDICINE PET SKULL BASE TO THIGH TECHNIQUE: 10.4 mCi F-18 FDG was injected intravenously. Full-ring PET imaging was performed from the skull base to thigh after the radiotracer. CT data was obtained and used for attenuation correction and anatomic localization. Fasting blood glucose: 104 mg/dl COMPARISON:  01/22/2018 FINDINGS: (Reference/background mediastinal blood pool activity: SUV max = 3.1) NECK: Previously seen scattered small cervical lymph nodes with mild FDG uptake have decreased since previous study. A single sub-cm right level 2 B lymph node is seen which has SUV max of 4.9, likely reactive in etiology. Incidental CT findings:  None. CHEST: No hypermetabolic masses or lymphadenopathy. Previously seen sub-cm right axillary lymph  nodes with low-grade FDG uptake have resolved. Stable diffuse hypermetabolic activity throughout the thyroid gland, consistent with thyroiditis. No suspicious pulmonary nodules seen on CT images. Incidental CT findings:  None. ABDOMEN/PELVIS: No abnormal hypermetabolic activity within the liver, pancreas, adrenal glands, or spleen. Significant decrease in hypermetabolic soft tissue mass in the cervix compared to previous study. This is difficult to measure but is currently approximately 2.9 cm in diameter compared to 6.2 cm previously. Current SUV max measures 4.7 compared to 24.7 previously. Mild left perirectal soft tissue density currently measures 1.6 cm on image 156/4 compared to 1.0 cm previously, however this has current SUV max 5.7 compared to 11.5 previously. Just inferior to this is a 2nd left perirectal soft tissue density which measures approximately 3 cm on image 165/4. This shows no significant change in size, but shows significant decreased hypermetabolic activity, with SUV max currently measuring 5.6 compared to 13.9 previously. There has been interval resolution of previously seen sub-cm hypermetabolic lymph nodes in the left common iliac, left external iliac, and right internal iliac chains since prior exam. Incidental CT findings: Increased mild right hydronephrosis. Moderate hepatic steatosis again noted. Stable small fibroid in left uterine fundus. SKELETON: No focal hypermetabolic bone lesions to suggest skeletal metastasis. Incidental CT findings:  None. IMPRESSION: Significant response to therapy with decreased hypermetabolic cervical soft tissue mass, left perirectal/presacral soft tissue density, and bilateral iliac lymphadenopathy. No new or progressive metastatic disease identified. Increased mild right hydronephrosis noted. Stable thyroiditis, hepatic steatosis, and small uterine fibroid. Electronically Signed   By: Earle Gell M.D.   On: 07/11/2018 15:28    All questions were  answered. The patient knows to call the clinic with any problems, questions or concerns. No barriers to learning was detected.  I spent 20 minutes counseling the patient face to face. The total time spent in the appointment was 25 minutes and more than 50% was on counseling and review of test results  [name], MD 07/20/2018 5:02 PM

## 2018-07-20 NOTE — Assessment & Plan Note (Signed)
We discussed laxatives

## 2018-07-20 NOTE — Patient Instructions (Signed)
Preventing Influenza, Adult Influenza, more commonly known as "the flu," is a viral infection that mainly affects the respiratory tract. The respiratory tract includes structures that help you breathe, such as the lungs, nose, and throat. The flu causes many common cold symptoms, as well as a high fever and body aches. The flu spreads easily from person to person (is contagious). The flu is most common from December through March. This is called flu season.You can catch the flu virus by:  Breathing in droplets from an infected person's cough or sneeze.  Touching something that was recently contaminated with the virus and then touching your mouth, nose, or eyes.  What can I do to lower my risk? You can decrease your risk of getting the flu by:  Getting a flu shot (influenza vaccination) every year. This is the best way to prevent the flu. A flu shot is recommended for everyone age 6 months and older. ? It is best to get a flu shot in the fall, as soon as it is available. Getting a flu shot during winter or spring instead is still a good idea. Flu season can last into early spring. ? Preventing the flu through vaccination requires getting a new flu shot every year. This is because the flu virus changes slightly (mutates) from one year to the next. Even if a flu shot does not completely protect you from all flu virus mutations, it can reduce the severity of your illness and prevent dangerous complications of the flu. ? If you are pregnant, you can and should get a flu shot. ? If you have had a reaction to the shot in the past or if you are allergic to eggs, check with your health care provider before getting a flu shot. ? Sometimes the vaccine is available as a nasal spray. In some years, the nasal spray has not been as effective against the flu virus. Check with your health care provider if you have questions about this.  Practicing good health habits. This is especially important during flu  season. ? Avoid contact with people who are sick with flu or cold symptoms. ? Wash your hands with soap and water often. If soap and water are not available, use hand sanitizer. ? Avoid touching your hands to your face, especially when you have not washed your hands recently. ? Use a disinfectant to clean surfaces at home and at work that may be contaminated with the flu virus. ? Keep your body's disease-fighting system (immune system) in good shape by eating a healthy diet, drinking plenty of fluids, getting enough sleep, and exercising regularly.  If you do get the flu, avoid spreading it to others by:  Staying home until your symptoms have been gone for at least one day.  Covering your mouth and nose with your elbow when you cough or sneeze.  Avoiding close contact with others, especially babies and elderly people.  Why are these changes important? Getting a flu shot and practicing good health habits protects you as well as other people. If you get the flu, your friends, family, and co-workers are also at risk of getting it, because it spreads so easily to others. Each year, about 2 out of every 10 people get the flu. Having the flu can lead to complications, such as pneumonia, ear infection, and sinus infection. The flu also can be deadly, especially for babies, people older than age 65, and people who have serious long-term diseases. How is this treated? Most people recover   from the flu by resting at home and drinking plenty of fluids. However, a prescription antiviral medicine may reduce your flu symptoms and may make your flu go away sooner. This medicine must be started within a few days of getting flu symptoms. You can talk with your health care provider about whether you need an antiviral medicine. Antiviral medicine may be prescribed for people who are at risk for more serious flu symptoms. This includes people who:  Are older than age 65.  Are pregnant.  Have a condition that  makes the flu worse or more dangerous.  Where to find more information:  Centers for Disease Control and Prevention: www.cdc.gov/flu/index.htm  Flu.gov: www.flu.gov/prevention-vaccination  American Academy of [employer]icians: familydoctor.org/familydoctor/en/kids/vaccines/preventing-the-flu.html Contact a health care provider if:  You have influenza and you develop new symptoms.  You have: ? Chest pain. ? Diarrhea. ? A fever.  Your cough gets worse, or you produce more mucus. Summary  The best way to prevent the flu is to get a flu shot every year in the fall.  Even if you get the flu after you have received the yearly vaccine, your flu may be milder and go away sooner because of your flu shot.  If you get the flu, antiviral medicines that are started with a few days of symptoms may reduce your flu symptoms and may make your flu go away sooner.  You can also help prevent the flu by practicing good health habits. This information is not intended to replace advice given to you by your health care provider. Make sure you discuss any questions you have with your health care provider. Document Released: 11/08/2015 Document Revised: 07/02/2016 Document Reviewed: 07/02/2016 Elsevier Interactive Patient Education  2018 [employer].  

## 2018-07-23 ENCOUNTER — Telehealth: Payer: Self-pay | Admitting: *Deleted

## 2018-07-23 NOTE — Telephone Encounter (Signed)
Called, spoke with the patient and moved her appt from 9/30 to tomorrow; along with the flush

## 2018-07-24 ENCOUNTER — Inpatient Hospital Stay (HOSPITAL_BASED_OUTPATIENT_CLINIC_OR_DEPARTMENT_OTHER): Payer: BLUE CROSS/BLUE SHIELD | Admitting: Gynecologic Oncology

## 2018-07-24 ENCOUNTER — Inpatient Hospital Stay: Payer: BLUE CROSS/BLUE SHIELD

## 2018-07-24 ENCOUNTER — Encounter: Payer: Self-pay | Admitting: Gynecologic Oncology

## 2018-07-24 VITALS — BP 108/58 | HR 62 | Temp 98.4°F | Resp 16 | Ht 61.0 in | Wt 217.4 lb

## 2018-07-24 DIAGNOSIS — Z923 Personal history of irradiation: Secondary | ICD-10-CM | POA: Diagnosis not present

## 2018-07-24 DIAGNOSIS — B20 Human immunodeficiency virus [HIV] disease: Secondary | ICD-10-CM | POA: Diagnosis not present

## 2018-07-24 DIAGNOSIS — C539 Malignant neoplasm of cervix uteri, unspecified: Secondary | ICD-10-CM

## 2018-07-24 DIAGNOSIS — Z9221 Personal history of antineoplastic chemotherapy: Secondary | ICD-10-CM | POA: Diagnosis not present

## 2018-07-24 DIAGNOSIS — C530 Malignant neoplasm of endocervix: Secondary | ICD-10-CM

## 2018-07-24 MED ORDER — SODIUM CHLORIDE 0.9% FLUSH
10.0000 mL | Freq: Once | INTRAVENOUS | Status: AC
  Administered 2018-07-24: 10 mL
  Filled 2018-07-24: qty 10

## 2018-07-24 MED ORDER — HEPARIN SOD (PORK) LOCK FLUSH 100 UNIT/ML IV SOLN
250.0000 [IU] | Freq: Once | INTRAVENOUS | Status: AC
  Administered 2018-07-24: 250 [IU]
  Filled 2018-07-24: qty 5

## 2018-07-24 NOTE — Patient Instructions (Signed)
Implanted Port Home Guide An implanted port is a type of central line that is placed under the skin. Central lines are used to provide IV access when treatment or nutrition needs to be given through a person's veins. Implanted ports are used for long-term IV access. An implanted port may be placed because:  You need IV medicine that would be irritating to the small veins in your hands or arms.  You need long-term IV medicines, such as antibiotics.  You need IV nutrition for a long period.  You need frequent blood draws for lab tests.  You need dialysis.  Implanted ports are usually placed in the chest area, but they can also be placed in the upper arm, the abdomen, or the leg. An implanted port has two main parts:  Reservoir. The reservoir is round and will appear as a small, raised area under your skin. The reservoir is the part where a needle is inserted to give medicines or draw blood.  Catheter. The catheter is a thin, flexible tube that extends from the reservoir. The catheter is placed into a large vein. Medicine that is inserted into the reservoir goes into the catheter and then into the vein.  How will I care for my incision site? Do not get the incision site wet. Bathe or shower as directed by your health care provider. How is my port accessed? Special steps must be taken to access the port:  Before the port is accessed, a numbing cream can be placed on the skin. This helps numb the skin over the port site.  Your health care provider uses a sterile technique to access the port. ? Your health care provider must put on a mask and sterile gloves. ? The skin over your port is cleaned carefully with an antiseptic and allowed to dry. ? The port is gently pinched between sterile gloves, and a needle is inserted into the port.  Only "non-coring" port needles should be used to access the port. Once the port is accessed, a blood return should be checked. This helps ensure that the port  is in the vein and is not clogged.  If your port needs to remain accessed for a constant infusion, a clear (transparent) bandage will be placed over the needle site. The bandage and needle will need to be changed every week, or as directed by your health care provider.  Keep the bandage covering the needle clean and dry. Do not get it wet. Follow your health care provider's instructions on how to take a shower or bath while the port is accessed.  If your port does not need to stay accessed, no bandage is needed over the port.  What is flushing? Flushing helps keep the port from getting clogged. Follow your health care provider's instructions on how and when to flush the port. Ports are usually flushed with saline solution or a medicine called heparin. The need for flushing will depend on how the port is used.  If the port is used for intermittent medicines or blood draws, the port will need to be flushed: ? After medicines have been given. ? After blood has been drawn. ? As part of routine maintenance.  If a constant infusion is running, the port may not need to be flushed.  How long will my port stay implanted? The port can stay in for as long as your health care provider thinks it is needed. When it is time for the port to come out, surgery will be   done to remove it. The procedure is similar to the one performed when the port was put in. When should I seek immediate medical care? When you have an implanted port, you should seek immediate medical care if:  You notice a bad smell coming from the incision site.  You have swelling, redness, or drainage at the incision site.  You have more swelling or pain at the port site or the surrounding area.  You have a fever that is not controlled with medicine.  This information is not intended to replace advice given to you by your health care provider. Make sure you discuss any questions you have with your health care provider. Document  Released: 10/24/2005 Document Revised: 03/31/2016 Document Reviewed: 07/01/2013 Elsevier Interactive Patient Education  2017 [employer].  

## 2018-07-24 NOTE — Progress Notes (Signed)
Consult Note: Gyn-Onc  Consult was requested by Dr. Ihor Dow for the evaluation of Misty Thomas 52 y.o. female  CC:  Chief Complaint  Patient presents with  . Malignant neoplasm of cervix, unspecified site [facility])    Assessment/Plan:  Misty Thomas  is a 52 y.o.  year old with stage IIIC cervical poorly differentiated carcinoma.   I discussed the diagnosis and prognosis with the patient.  I am recommending a PET scan to evaluate for distant metastatic disease.  I am recommending consultation with medical and radiation oncology and discussion of definitive therapy with chemoradiation with curative intent.  I discussed potential toxicities related to therapy. I discussed the importance of long term follow-up with this disease and risk for recurrence.  The patient has significant leakage of fluid from the vagina. 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   The patient has been recently diagnosed with diabetes, but is not yet on treatment for this because she was diagnosed at an urgent care. We will assist in finding her a primary care provider.  HPI: Ms Misty Thomas is a 52 year old P3 who is seen in consultation at the request of Dr Ihor Dow for cervical cancer.  The patient reports"several months"of  abnormal uterine bleeding and pelvic pain.  She was seen approximately 3 months ago at [facility] urgent care where according to the patient they performed a Pap smear, though this may likely reflect just a speculum exam, and no abnormalities were identified.  She states that her last Pap smear was a couple of years ago however it is unclear to the patient if this was within 5 years or greater than that.  She denies ever having history of an abnormal Pap smear.  She continued to have abnormal bleeding and eventually was evaluated at [facility] where a CT scan of the abdomen and pelvis was performed on every 27th 2019.  This  showed a small amount of free fluid within the pelvis, there is a 3.5 cm left renal cyst.  There is an 18 mm right pelvic sidewall lesion likely representing an enlarged iliac chain lymph node.  The ovaries were poorly visualized.  There is a 5 x 5.5 complex collection or mass that is within the pelvis and appears to be in the region of the cervix and may represent a necrotic mass or neoplasm.  There is a 3 x 3 cm low attenuating lesion to the left of the rectum which may represent an abnormal lymph node.  On physical examination there was red ratty tissue at the top of the vagina and the cervix was not visualized there is a small amount of watery bloody discharge with foul odor appreciated.  She was taken to the operating room on January 04, 2018 for an exam under anesthesia with biopsies that she was not able to tolerate an exam in the outpatient setting.  The patient was taken to the operating room where general anesthesia was performed she had insertion of a bivalve speculum in the vagina and the macerated cervix was noted a biopsy was taken from the cervix.  The pathology from this procedure revealed poorly differentiated carcinoma staining positive for cytokeratin, P 63, NP 16 and vimentin, negative for ER.  She reports watery bloody discharge from vagina.   The patient otherwise is of unknown general medical health she does not see a primary care doctor.  She has been seen in urgent care  on multiple occasions where she was diagnosed with diabetes however this is not being treated as she does not have a regular provider.  She has a remote history of an MRSA infection approximately 4 years ago.  Interval Hx:   Pretreatment PET CT scan performed on January 22, 2018 revealed a large hypermetabolic cervical mass with local invasion into the perirectal space and bilateral pelvic lymphadenopathy.  There was small scattered bilateral hypermetabolic lymph nodes in the neck and axilla are of uncertain  significance.  However due to a lack of abdominal nodal disease it was suspected that these were reactive.  She was provided with a presumed stage of stage IIIc cervical cancer and in accordance with NCCN guidelines received definitive curative intent chemoradiation therapy, including external beam radiation between February 05, 2018 and Mar 16, 2018 with 45 Gy to the cervix and pelvis, and a sidewall parametrial boost of 9 Gy, and cervical intracavitary brachii therapy with iridium 192 for a total dose of 27.5 Gy.  Her watery discharge resolved.  However she continued to experience pelvic pain.   She received posttreatment PET scan on July 11, 2018 which revealed significant response to therapy with decreased hypermetabolic soft tissue mass and left perirectal presacral soft tissue density and bilateral iliac lymphadenopathy there was no new or progressive metastatic disease identified.  Current Meds:  Outpatient Encounter Medications as of 07/24/2018  Medication Sig  . acetaminophen (TYLENOL) 500 MG tablet Take 500 mg by mouth every 6 (six) hours as needed.  Marland Kitchen BIKTARVY 50-200-25 MG TABS tablet TAKE 1 TABLET BY MOUTH DAILY.  Marland Kitchen docusate sodium (COLACE) 100 MG capsule Take 100 mg by mouth every morning.   Marland Kitchen levothyroxine (SYNTHROID, LEVOTHROID) 25 MCG tablet Take 1 tablet (25 mcg total) by mouth daily before breakfast.  . magnesium oxide (MAG-OX) 400 (241.3 Mg) MG tablet Take 1 tablet (400 mg total) by mouth 2 (two) times daily.  . Misc Natural Products (ESTROVEN + ENERGY MAX STRENGTH PO) Take 1 tablet by mouth daily.  Marland Kitchen morphine (MSIR) 15 MG tablet Take 1 tablet (15 mg total) by mouth every 6 (six) hours as needed for severe pain.  Marland Kitchen ondansetron (ZOFRAN) 4 MG tablet Take 1 tablet (4 mg total) by mouth every 8 (eight) hours as needed for nausea or vomiting.  . [DISCONTINUED] prochlorperazine (COMPAZINE) 10 MG tablet Take 1 tablet (10 mg total) by mouth every 6 (six) hours as needed (Nausea or  vomiting).   No facility-administered encounter medications on file as of 07/24/2018.     Allergy: No Known Allergies  Social Hx:   Social History   Socioeconomic History  . Marital status: Married    Spouse name: [name]  . Number of children: 3  . Years of education: Not on file  . Highest education level: Not on file  Occupational History  . Not on file  Social Needs  . Financial resource strain: Somewhat hard  . Food insecurity:    Worry: Sometimes true    Inability: Sometimes true  . Transportation needs:    Medical: No    Non-medical: No  Tobacco Use  . Smoking status: Never Smoker  . Smokeless tobacco: Never Used  Substance and Sexual Activity  . Alcohol use: Not Currently    Frequency: Never  . Drug use: No  . Sexual activity: Not Currently    Birth control/protection: Surgical  Lifestyle  . Physical activity:    Days per week: 0 days    Minutes per session:  0 min  . Stress: Rather much  Relationships  . Social connections:    Talks on phone: Once a week    Gets together: Once a week    Attends religious service: Never    Active member of club or organization: No    Attends meetings of clubs or organizations: Not on file    Relationship status: Married  . Intimate partner violence:    Fear of current or ex partner: Not on file    Emotionally abused: Not on file    Physically abused: Not on file    Forced sexual activity: Not on file  Other Topics Concern  . Not on file  Social History Narrative  . Not on file    Past Surgical Hx:  Past Surgical History:  Procedure Laterality Date  . EUA/ CERVICAL BX  01-04-2018   dr Ihor Dow  [facility]  . IR FLUORO GUIDE PORT INSERTION RIGHT  02/01/2018  . TANDEM RING INSERTION N/A 03/20/2018   Procedure: TANDEM RING INSERTION;  Surgeon: Gery Pray, MD;  Location: [facility];  Service: Urology;  Laterality: N/A;  . TANDEM RING INSERTION N/A 03/26/2018   Procedure: TANDEM RING INSERTION;  Surgeon:  Gery Pray, MD;  Location: [facility];  Service: Urology;  Laterality: N/A;  . TANDEM RING INSERTION N/A 04/04/2018   Procedure: TANDEM RING INSERTION;  Surgeon: Gery Pray, MD;  Location: [facility];  Service: Urology;  Laterality: N/A;  . TANDEM RING INSERTION N/A 04/12/2018   Procedure: TANDEM RING INSERTION;  Surgeon: Gery Pray, MD;  Location: [facility];  Service: Urology;  Laterality: N/A;  . TANDEM RING INSERTION N/A 04/18/2018   Procedure: TANDEM RING INSERTION;  Surgeon: Gery Pray, MD;  Location: [facility];  Service: Urology;  Laterality: N/A;  . TUBAL LIGATION Bilateral 1990s    Past Medical Hx:  Past Medical History:  Diagnosis Date  . Acquired pancytopenia ([place]) 03/2018  . Anemia    Mild  . Cancer associated pain   . Cervical cancer [facility]) oncologist-  dr gorsuch/  dr Sondra Come   dx 01-04-2017-- Stage IIIB,  cercial adenocarcinoma w/ local invasion perirectal area-- chemo started 02-09-2018 and external beam radiation completed 03/16/2018 ,  started brachytherapy boost high dose radiation 03-20-2018  . Chemotherapy induced nausea and vomiting   . Diabetes mellitus type 2, diet-controlled ([place])   . Frequency of urination   . History of cellulitis 2018   bilateral lower leg w/ mrsa  . History of external beam radiation therapy 02-05-2018  to 03-16-2018   cervical cancer  . History of MRSA infection 2018   w/ bilateral lower leg cellulitis  . HIV (human immunodeficiency virus infection) ([place])    asymptomatic  . Hypomagnesemia 03/2018   Severe---- takes oral magnesium and IV magnesium as needed (cancer center)  . Intermittent diarrhea    due to radiation/ chemo  . Nocturia   . Port-A-Cath in place    Power port  . Radiation burn    LOWER ABD.  04-06-2018  per is healing  . Subclinical hypothyroidism    w/ thyroiditis , dx 01-22-2018 PET scan  . Thrombocytopenia ([place])   . Wears glasses     Past  Gynecological History:  SVD x 3 No LMP recorded. Patient is premenopausal.  Family Hx:  Family History  Problem Relation Age of Onset  . Cancer Maternal Grandmother        unknown ca  Review of Systems:  Constitutional  Feels well,   ENT Normal appearing ears and nares bilaterally Skin/Breast  No rash, sores, jaundice, itching, dryness Cardiovascular  No chest pain, shortness of breath, or edema  Pulmonary  No cough or wheeze.  Gastro Intestinal  No nausea, vomitting, or diarrhoea. No bright red blood per rectum, change in bowel movement, or constipation. + pelvic pain Genito Urinary  no watery discharge, bleeding Musculo Skeletal  No myalgia, arthralgia, joint swelling or pain  Neurologic  No weakness, numbness, change in gait,  Psychology  No depression, anxiety, insomnia.   Vitals:  Blood pressure (!) 108/58, pulse 62, temperature 98.4 F (36.9 C), temperature source Oral, resp. rate 16, height 5\' 1"  (1.549 m), weight 217 lb 6 oz (98.6 kg), SpO2 100 %.  Physical Exam: WD in NAD Neck  Supple NROM, without any enlargements.  Lymph Node Survey No cervical supraclavicular or inguinal adenopathy Cardiovascular  Pulse normal rate, regularity and rhythm. S1 and S2 normal.  Lungs  Clear to auscultation bilateraly, without wheezes/crackles/rhonchi. Good air movement.  Skin  No rash/lesions/breakdown  Psychiatry  Alert and oriented to person, place, and time  Abdomen  Normoactive bowel sounds, abdomen soft, non-tender and obese without evidence of hernia. Back No CVA tenderness Genito Urinary  Vulva/vagina: Normal external female genitalia.   No lesions. No discharge or bleeding.  Bladder/urethra:  No lesions or masses, well supported bladder  Vagina: grossly normal, narrowed from radiation changes  Cervix: ulceration in place of cervix (consistent with radiation necrosis) with no palpable mass. Some induration in the left parametrial tissues consistent with  radiation changes.    Uterus:  Slightly bulky, mobile, no parametrial involvement or nodularity.  Adnexa: no discrete masses. Rectal  Good tone, no masses no cul de sac nodularity.  Extremities  No bilateral cyanosis, clubbing or edema.   Thereasa Solo, MD  07/24/2018, 3:40 PM

## 2018-07-24 NOTE — Patient Instructions (Signed)
Please return to see Dr Denman George in March, 2020.  After your December, 2019 appointment with Dr Sondra Come, please have his office call up to this office and schedule follow-up with Dr Denman George for March.  Please notify Dr Denman George at phone number [phone] if you notice vaginal bleeding, new pelvic or abdominal pains, bloating, feeling full easy, or a change in bladder or bowel function.

## 2018-08-01 MED FILL — MORPHINE SULFATE IR 15 MG T: 15 | 15 days supply | Qty: 60 | Fill #0

## 2018-08-06 ENCOUNTER — Ambulatory Visit: Payer: BLUE CROSS/BLUE SHIELD | Admitting: Gynecologic Oncology

## 2018-08-10 ENCOUNTER — Other Ambulatory Visit: Payer: Self-pay | Admitting: Hematology and Oncology

## 2018-08-10 ENCOUNTER — Telehealth: Payer: Self-pay

## 2018-08-10 MED ORDER — MORPHINE SULFATE 30 MG PO TABS: 30.0000 mg | ORAL_TABLET | Freq: Four times a day (QID) | ORAL | 0 refills | Status: DC | PRN

## 2018-08-10 MED FILL — MORPHINE SULFATE IR 30 MG T: 30 | 15 days supply | Qty: 60 | Fill #0

## 2018-08-10 NOTE — Telephone Encounter (Signed)
Which pharmacY? Also, remind her to call us mid-week in the future for refills

## 2018-08-10 NOTE — Telephone Encounter (Signed)
She called and left a message requesting refill on Morphine. 

## 2018-08-10 NOTE — Telephone Encounter (Signed)
Called back. She uses [facility]. She is asking if Dr. Alvy Bimler can prescribe 30 mg Morphine instead of 15 mg so she will not have to take 2 tablets.

## 2018-08-10 NOTE — Telephone Encounter (Signed)
OK 

## 2018-08-20 MED FILL — BIKTARVY 50-200-25 MG TABS: 50-200-25 | 30 days supply | Qty: 30 | Fill #2

## 2018-08-27 ENCOUNTER — Other Ambulatory Visit: Payer: Self-pay | Admitting: Hematology and Oncology

## 2018-08-27 ENCOUNTER — Telehealth: Payer: Self-pay

## 2018-08-27 MED ORDER — MORPHINE SULFATE 30 MG PO TABS: 30.0000 mg | ORAL_TABLET | Freq: Four times a day (QID) | ORAL | 0 refills | Status: DC | PRN

## 2018-08-27 NOTE — Telephone Encounter (Signed)
She called and left a message requesting Morphine Rx sent to pharmacy.

## 2018-08-28 ENCOUNTER — Other Ambulatory Visit: Payer: Self-pay | Admitting: Hematology and Oncology

## 2018-08-28 MED ORDER — MORPHINE SULFATE 30 MG PO TABS: 30.0000 mg | ORAL_TABLET | Freq: Four times a day (QID) | ORAL | 0 refills | Status: DC | PRN

## 2018-08-28 MED FILL — MORPHINE SULFATE IR 30 MG T: 30 | 15 days supply | Qty: 60 | Fill #0

## 2018-09-03 ENCOUNTER — Other Ambulatory Visit: Payer: Self-pay | Admitting: Hematology and Oncology

## 2018-09-03 DIAGNOSIS — C530 Malignant neoplasm of endocervix: Secondary | ICD-10-CM

## 2018-09-04 ENCOUNTER — Telehealth: Payer: Self-pay | Admitting: Hematology and Oncology

## 2018-09-04 NOTE — Telephone Encounter (Signed)
R/s appt per 10/28 sch message - pt is aware of appt - schedule don 12/9 and 12/10 per patient preferences

## 2018-09-10 ENCOUNTER — Other Ambulatory Visit: Payer: BLUE CROSS/BLUE SHIELD

## 2018-09-10 ENCOUNTER — Ambulatory Visit: Payer: BLUE CROSS/BLUE SHIELD | Admitting: Hematology and Oncology

## 2018-09-13 ENCOUNTER — Telehealth: Payer: Self-pay

## 2018-09-13 ENCOUNTER — Other Ambulatory Visit: Payer: Self-pay | Admitting: Internal Medicine

## 2018-09-13 ENCOUNTER — Other Ambulatory Visit: Payer: Self-pay

## 2018-09-13 DIAGNOSIS — B20 Human immunodeficiency virus [HIV] disease: Secondary | ICD-10-CM

## 2018-09-13 MED ORDER — MORPHINE SULFATE 30 MG PO TABS: 30.0000 mg | ORAL_TABLET | Freq: Four times a day (QID) | ORAL | 0 refills | Status: DC | PRN

## 2018-09-13 MED FILL — MORPHINE SULFATE IR 30 MG T: 30 | 15 days supply | Qty: 60 | Fill #0

## 2018-09-13 NOTE — Telephone Encounter (Signed)
She called and requested a refill on her Morphine Rx.  Called back and told Rx ready. She come and pick Rx.

## 2018-09-19 MED FILL — BIKTARVY 50-200-25 MG TABS: 50-200-25 | 30 days supply | Qty: 30 | Fill #0

## 2018-10-01 ENCOUNTER — Other Ambulatory Visit: Payer: Self-pay | Admitting: *Deleted

## 2018-10-01 MED ORDER — MORPHINE SULFATE 30 MG PO TABS: 30.0000 mg | ORAL_TABLET | Freq: Four times a day (QID) | ORAL | 0 refills | Status: DC | PRN

## 2018-10-01 MED FILL — MORPHINE SULFATE IR 30 MG T: 30 | 15 days supply | Qty: 60 | Fill #0

## 2018-10-03 ENCOUNTER — Telehealth: Payer: Self-pay | Admitting: *Deleted

## 2018-10-03 NOTE — Telephone Encounter (Signed)
Patient called to report that she has had sever diarrhea for 2 weeks now. She advised she does not know when it is coming and Imodium is not working for her. She advised she can not tell me how many times a day because is it so often and comes without her knowing. Asked if she has contacted her Oncologist and she advised no she is not having chemo at this time. No fever, pain or shortness of breath. Liquid consistency and frequency. Offered her a visit this afternoon and she declined advised she does not have any diapers. Advised her if it is that bad she may need to be seen at the ED. She asked that I give her something next week. Gave her an appointment Monday 10/08/18 a [id number] with [facility] and advised her if she gets worse or starts having fever she should be seen at ED.   Patient tearful during conversation.

## 2018-10-03 NOTE — Telephone Encounter (Signed)
Ok, thanks.

## 2018-10-08 ENCOUNTER — Ambulatory Visit: Payer: BLUE CROSS/BLUE SHIELD | Admitting: Internal Medicine

## 2018-10-09 ENCOUNTER — Telehealth: Payer: Self-pay

## 2018-10-09 ENCOUNTER — Inpatient Hospital Stay: Payer: BLUE CROSS/BLUE SHIELD

## 2018-10-09 ENCOUNTER — Other Ambulatory Visit: Payer: Self-pay | Admitting: Hematology and Oncology

## 2018-10-09 ENCOUNTER — Inpatient Hospital Stay: Payer: BLUE CROSS/BLUE SHIELD | Attending: Gynecologic Oncology

## 2018-10-09 ENCOUNTER — Ambulatory Visit (HOSPITAL_COMMUNITY)
Admission: RE | Admit: 2018-10-09 | Discharge: 2018-10-09 | Disposition: A | Discharge status: Home / Self Care | Payer: BLUE CROSS/BLUE SHIELD | Source: Ambulatory Visit | Attending: Hematology and Oncology | Admitting: Hematology and Oncology

## 2018-10-09 DIAGNOSIS — D61818 Other pancytopenia: Secondary | ICD-10-CM

## 2018-10-09 DIAGNOSIS — C530 Malignant neoplasm of endocervix: Secondary | ICD-10-CM | POA: Diagnosis present

## 2018-10-09 DIAGNOSIS — D500 Iron deficiency anemia secondary to blood loss (chronic): Secondary | ICD-10-CM | POA: Diagnosis not present

## 2018-10-09 DIAGNOSIS — Z5111 Encounter for antineoplastic chemotherapy: Secondary | ICD-10-CM | POA: Diagnosis not present

## 2018-10-09 DIAGNOSIS — Z79899 Other long term (current) drug therapy: Secondary | ICD-10-CM | POA: Diagnosis not present

## 2018-10-09 DIAGNOSIS — R1084 Generalized abdominal pain: Secondary | ICD-10-CM | POA: Diagnosis not present

## 2018-10-09 DIAGNOSIS — Z923 Personal history of irradiation: Secondary | ICD-10-CM | POA: Diagnosis not present

## 2018-10-09 DIAGNOSIS — Z21 Asymptomatic human immunodeficiency virus [HIV] infection status: Secondary | ICD-10-CM | POA: Insufficient documentation

## 2018-10-09 DIAGNOSIS — N189 Chronic kidney disease, unspecified: Secondary | ICD-10-CM | POA: Diagnosis not present

## 2018-10-09 DIAGNOSIS — D631 Anemia in chronic kidney disease: Secondary | ICD-10-CM | POA: Insufficient documentation

## 2018-10-09 DIAGNOSIS — G893 Neoplasm related pain (acute) (chronic): Secondary | ICD-10-CM | POA: Insufficient documentation

## 2018-10-09 LAB — CBC WITH DIFFERENTIAL/PLATELET
Abs Immature Granulocytes: 0.08 10*3/uL — ABNORMAL HIGH (ref 0.00–0.07)
Basophils Absolute: 0 10*3/uL (ref 0.0–0.1)
Basophils Relative: 0 %
EOS ABS: 0 10*3/uL (ref 0.0–0.5)
EOS PCT: 0 %
HEMATOCRIT: 20.9 % — AB (ref 36.0–46.0)
HEMOGLOBIN: 6.6 g/dL — AB (ref 12.0–15.0)
Immature Granulocytes: 1 %
LYMPHS ABS: 0.9 10*3/uL (ref 0.7–4.0)
LYMPHS PCT: 8 %
MCH: 30.1 pg (ref 26.0–34.0)
MCHC: 31.6 g/dL (ref 30.0–36.0)
MCV: 95.4 fL (ref 80.0–100.0)
MONO ABS: 0.6 10*3/uL (ref 0.1–1.0)
MONOS PCT: 6 %
Neutro Abs: 9 10*3/uL — ABNORMAL HIGH (ref 1.7–7.7)
Neutrophils Relative %: 85 %
Platelets: 688 10*3/uL — ABNORMAL HIGH (ref 150–400)
RBC: 2.19 MIL/uL — ABNORMAL LOW (ref 3.87–5.11)
RDW: 14.2 % (ref 11.5–15.5)
WBC: 10.6 10*3/uL — ABNORMAL HIGH (ref 4.0–10.5)
nRBC: 0 % (ref 0.0–0.2)

## 2018-10-09 LAB — COMPREHENSIVE METABOLIC PANEL
ALBUMIN: 3.2 g/dL — AB (ref 3.5–5.0)
ALK PHOS: 74 U/L (ref 38–126)
ALT: <6 U/L (ref 0–44)
ANION GAP: 13 (ref 5–15)
AST: 9 U/L — ABNORMAL LOW (ref 15–41)
BILIRUBIN TOTAL: 0.3 mg/dL (ref 0.3–1.2)
BUN: 32 mg/dL — ABNORMAL HIGH (ref 6–20)
CALCIUM: 9.7 mg/dL (ref 8.9–10.3)
CO2: 17 mmol/L — ABNORMAL LOW (ref 22–32)
Chloride: 112 mmol/L — ABNORMAL HIGH (ref 98–111)
Creatinine, Ser: 2.81 mg/dL — ABNORMAL HIGH (ref 0.44–1.00)
GFR calc Af Amer: 22 mL/min — ABNORMAL LOW (ref >60)
GFR, EST NON AFRICAN AMERICAN: 19 mL/min — AB (ref >60)
GLUCOSE: 89 mg/dL (ref 70–99)
Potassium: 3.7 mmol/L (ref 3.5–5.1)
Sodium: 142 mmol/L (ref 135–145)
TOTAL PROTEIN: 8.9 g/dL — AB (ref 6.5–8.1)

## 2018-10-09 LAB — IRON AND TIBC
Iron: 23 ug/dL — ABNORMAL LOW (ref 41–142)
Saturation Ratios: 10 % — ABNORMAL LOW (ref 21–57)
TIBC: 246 ug/dL (ref 236–444)
UIBC: 223 ug/dL (ref 120–384)

## 2018-10-09 LAB — FERRITIN: FERRITIN: 388 ng/mL — AB (ref 11–307)

## 2018-10-09 LAB — GLUCOSE, CAPILLARY: Glucose-Capillary: 81 mg/dL (ref 70–99)

## 2018-10-09 LAB — MAGNESIUM: MAGNESIUM: 2.3 mg/dL (ref 1.7–2.4)

## 2018-10-09 LAB — ABO/RH: ABO/RH(D): O NEG

## 2018-10-09 LAB — SAMPLE TO BLOOD BANK

## 2018-10-09 LAB — PREPARE RBC (CROSSMATCH)

## 2018-10-09 MED ORDER — FLUDEOXYGLUCOSE F - 18 (FDG) INJECTION
10.8000 | Freq: Once | INTRAVENOUS | Status: AC | PRN
  Administered 2018-10-09: 10.8 via INTRAVENOUS

## 2018-10-09 MED ORDER — SODIUM CHLORIDE 0.9% FLUSH
10.0000 mL | Freq: Once | INTRAVENOUS | Status: AC
  Administered 2018-10-09: 10 mL
  Filled 2018-10-09: qty 10

## 2018-10-09 NOTE — Addendum Note (Signed)
Addended by: Flo Shanks on: 10/09/2018 01:16 PM   Modules accepted: Orders

## 2018-10-09 NOTE — Telephone Encounter (Signed)
Called and left a message asking her to call the office as soon as she gets this message. She needs to come back to the office for more lab work.

## 2018-10-09 NOTE — Telephone Encounter (Signed)
Called husband and given 8 am appt time for tomorrow for blood transfusion for 5 hours. Instructed to keep blood bracelet on. He verbalized understanding.

## 2018-10-09 NOTE — Telephone Encounter (Signed)
Left a message asking her to call the office as soon as she gets the message.

## 2018-10-10 ENCOUNTER — Inpatient Hospital Stay: Payer: BLUE CROSS/BLUE SHIELD

## 2018-10-10 ENCOUNTER — Telehealth: Payer: Self-pay | Admitting: Hematology and Oncology

## 2018-10-10 ENCOUNTER — Other Ambulatory Visit: Payer: Self-pay | Admitting: Hematology and Oncology

## 2018-10-10 ENCOUNTER — Telehealth: Payer: Self-pay | Admitting: Oncology

## 2018-10-10 DIAGNOSIS — C530 Malignant neoplasm of endocervix: Secondary | ICD-10-CM | POA: Diagnosis not present

## 2018-10-10 DIAGNOSIS — N1330 Unspecified hydronephrosis: Secondary | ICD-10-CM | POA: Insufficient documentation

## 2018-10-10 DIAGNOSIS — N1339 Other hydronephrosis: Secondary | ICD-10-CM

## 2018-10-10 DIAGNOSIS — D61818 Other pancytopenia: Secondary | ICD-10-CM

## 2018-10-10 MED ORDER — SODIUM CHLORIDE 0.9% IV SOLUTION
250.0000 mL | Freq: Once | INTRAVENOUS | Status: AC
  Administered 2018-10-10: 250 mL via INTRAVENOUS
  Filled 2018-10-10: qty 250

## 2018-10-10 MED ORDER — ACETAMINOPHEN 325 MG PO TABS
ORAL_TABLET | ORAL | Status: AC
  Filled 2018-10-10: qty 2

## 2018-10-10 MED ORDER — ACETAMINOPHEN 325 MG PO TABS
650.0000 mg | ORAL_TABLET | Freq: Once | ORAL | Status: AC
  Administered 2018-10-10: 650 mg via ORAL

## 2018-10-10 MED ORDER — SODIUM CHLORIDE 0.9% FLUSH
3.0000 mL | INTRAVENOUS | Status: DC | PRN
  Filled 2018-10-10: qty 10

## 2018-10-10 MED ORDER — HEPARIN SOD (PORK) LOCK FLUSH 100 UNIT/ML IV SOLN
500.0000 [IU] | Freq: Every day | INTRAVENOUS | Status: AC | PRN
  Administered 2018-10-10: 500 [IU]
  Filled 2018-10-10: qty 5

## 2018-10-10 MED ORDER — DIPHENHYDRAMINE HCL 25 MG PO CAPS
25.0000 mg | ORAL_CAPSULE | Freq: Once | ORAL | Status: AC
  Administered 2018-10-10: 25 mg via ORAL

## 2018-10-10 MED ORDER — DIPHENHYDRAMINE HCL 25 MG PO CAPS
ORAL_CAPSULE | ORAL | Status: AC
  Filled 2018-10-10: qty 1

## 2018-10-10 MED ORDER — SODIUM CHLORIDE 0.9% FLUSH
10.0000 mL | INTRAVENOUS | Status: AC | PRN
  Administered 2018-10-10: 10 mL
  Filled 2018-10-10: qty 10

## 2018-10-10 NOTE — Telephone Encounter (Signed)
Patient stopped by for a printed calendar and to ask nurse for note for work.

## 2018-10-10 NOTE — Patient Instructions (Signed)

## 2018-10-10 NOTE — Telephone Encounter (Signed)
Sent in urgent referral to [facility].  Appointment scheduled for 12:15 tomorrow, 10/11/18 with Dr. Louis Meckel.  Notified Kalenna in infusion and also called her husband.  Also reviewed appointments for Monday with Mr. Solana.  He verbalized understanding and agreement.

## 2018-10-11 ENCOUNTER — Telehealth: Payer: Self-pay | Admitting: *Deleted

## 2018-10-11 ENCOUNTER — Other Ambulatory Visit: Payer: Self-pay | Admitting: Urology

## 2018-10-11 LAB — TYPE AND SCREEN
ABO/RH(D): O NEG
Antibody Screen: NEGATIVE
UNIT DIVISION: 0
Unit division: 0

## 2018-10-11 LAB — BPAM RBC
Blood Product Expiration Date: 201912302359
Blood Product Expiration Date: 201912312359
ISSUE DATE / TIME: 201912040901
ISSUE DATE / TIME: 201912040901
Unit Type and Rh: 9500
Unit Type and Rh: 9500

## 2018-10-11 NOTE — Telephone Encounter (Signed)
Medical records faxed to [facility] [place]; release [phone]

## 2018-10-15 ENCOUNTER — Inpatient Hospital Stay: Payer: BLUE CROSS/BLUE SHIELD

## 2018-10-15 ENCOUNTER — Inpatient Hospital Stay (HOSPITAL_BASED_OUTPATIENT_CLINIC_OR_DEPARTMENT_OTHER): Payer: BLUE CROSS/BLUE SHIELD | Admitting: Hematology and Oncology

## 2018-10-15 ENCOUNTER — Telehealth: Payer: Self-pay | Admitting: Hematology and Oncology

## 2018-10-15 ENCOUNTER — Other Ambulatory Visit: Payer: BLUE CROSS/BLUE SHIELD

## 2018-10-15 ENCOUNTER — Encounter: Payer: Self-pay | Admitting: Hematology and Oncology

## 2018-10-15 ENCOUNTER — Telehealth: Payer: Self-pay | Admitting: Oncology

## 2018-10-15 DIAGNOSIS — Z79899 Other long term (current) drug therapy: Secondary | ICD-10-CM

## 2018-10-15 DIAGNOSIS — N189 Chronic kidney disease, unspecified: Secondary | ICD-10-CM

## 2018-10-15 DIAGNOSIS — Z923 Personal history of irradiation: Secondary | ICD-10-CM

## 2018-10-15 DIAGNOSIS — C530 Malignant neoplasm of endocervix: Secondary | ICD-10-CM

## 2018-10-15 DIAGNOSIS — D500 Iron deficiency anemia secondary to blood loss (chronic): Secondary | ICD-10-CM | POA: Diagnosis not present

## 2018-10-15 DIAGNOSIS — D61818 Other pancytopenia: Secondary | ICD-10-CM

## 2018-10-15 DIAGNOSIS — D631 Anemia in chronic kidney disease: Secondary | ICD-10-CM

## 2018-10-15 DIAGNOSIS — Z7189 Other specified counseling: Secondary | ICD-10-CM | POA: Insufficient documentation

## 2018-10-15 DIAGNOSIS — G893 Neoplasm related pain (acute) (chronic): Secondary | ICD-10-CM

## 2018-10-15 DIAGNOSIS — Z21 Asymptomatic human immunodeficiency virus [HIV] infection status: Secondary | ICD-10-CM

## 2018-10-15 LAB — CBC WITH DIFFERENTIAL/PLATELET
Abs Immature Granulocytes: 0.1 10*3/uL — ABNORMAL HIGH (ref 0.00–0.07)
Basophils Absolute: 0 10*3/uL (ref 0.0–0.1)
Basophils Relative: 0 %
Eosinophils Absolute: 0 10*3/uL (ref 0.0–0.5)
Eosinophils Relative: 0 %
HCT: 26.7 % — ABNORMAL LOW (ref 36.0–46.0)
Hemoglobin: 8.6 g/dL — ABNORMAL LOW (ref 12.0–15.0)
IMMATURE GRANULOCYTES: 1 %
Lymphocytes Relative: 6 %
Lymphs Abs: 0.9 10*3/uL (ref 0.7–4.0)
MCH: 30.2 pg (ref 26.0–34.0)
MCHC: 32.2 g/dL (ref 30.0–36.0)
MCV: 93.7 fL (ref 80.0–100.0)
Monocytes Absolute: 0.6 10*3/uL (ref 0.1–1.0)
Monocytes Relative: 4 %
Neutro Abs: 12.3 10*3/uL — ABNORMAL HIGH (ref 1.7–7.7)
Neutrophils Relative %: 89 %
Platelets: 586 10*3/uL — ABNORMAL HIGH (ref 150–400)
RBC: 2.85 MIL/uL — ABNORMAL LOW (ref 3.87–5.11)
RDW: 15.4 % (ref 11.5–15.5)
WBC: 13.9 10*3/uL — ABNORMAL HIGH (ref 4.0–10.5)
nRBC: 0 % (ref 0.0–0.2)

## 2018-10-15 LAB — COMPREHENSIVE METABOLIC PANEL
ALT: <6 U/L (ref 0–44)
AST: 10 U/L — ABNORMAL LOW (ref 15–41)
Albumin: 3 g/dL — ABNORMAL LOW (ref 3.5–5.0)
Alkaline Phosphatase: 84 U/L (ref 38–126)
Anion gap: 13 (ref 5–15)
BUN: 19 mg/dL (ref 6–20)
CO2: 18 mmol/L — ABNORMAL LOW (ref 22–32)
Calcium: 9.6 mg/dL (ref 8.9–10.3)
Chloride: 110 mmol/L (ref 98–111)
Creatinine, Ser: 1.82 mg/dL — ABNORMAL HIGH (ref 0.44–1.00)
GFR calc Af Amer: 36 mL/min — ABNORMAL LOW (ref >60)
GFR calc non Af Amer: 31 mL/min — ABNORMAL LOW (ref >60)
Glucose, Bld: 96 mg/dL (ref 70–99)
Potassium: 3.5 mmol/L (ref 3.5–5.1)
Sodium: 141 mmol/L (ref 135–145)
Total Bilirubin: 0.4 mg/dL (ref 0.3–1.2)
Total Protein: 8.9 g/dL — ABNORMAL HIGH (ref 6.5–8.1)

## 2018-10-15 LAB — SAMPLE TO BLOOD BANK

## 2018-10-15 LAB — MAGNESIUM: Magnesium: 1.7 mg/dL (ref 1.7–2.4)

## 2018-10-15 MED ORDER — SODIUM CHLORIDE 0.9% FLUSH
10.0000 mL | Freq: Once | INTRAVENOUS | Status: AC
  Administered 2018-10-15: 10 mL
  Filled 2018-10-15: qty 10

## 2018-10-15 MED ORDER — MORPHINE SULFATE ER 60 MG PO TBCR: 60.0000 mg | EXTENDED_RELEASE_TABLET | Freq: Two times a day (BID) | ORAL | 0 refills | Status: DC

## 2018-10-15 MED ORDER — DEXAMETHASONE 4 MG PO TABS: ORAL_TABLET | ORAL | 0 refills | Status: DC

## 2018-10-15 MED ORDER — MORPHINE SULFATE 30 MG PO TABS: 30.0000 mg | ORAL_TABLET | Freq: Four times a day (QID) | ORAL | 0 refills | Status: DC | PRN

## 2018-10-15 MED FILL — DEXAMETHASONE 4 MG TABLET: 4 | 21 days supply | Qty: 60 | Fill #0

## 2018-10-15 MED FILL — MORPHINE SULF 60 MG TAB SA: 60 | 30 days supply | Qty: 60 | Fill #0

## 2018-10-15 MED FILL — BIKTARVY 50-200-25 MG TABS: 50-200-25 | 30 days supply | Qty: 30 | Fill #1

## 2018-10-15 MED FILL — MORPHINE SULFATE IR 30 MG T: 30 | 22 days supply | Qty: 90 | Fill #0

## 2018-10-15 NOTE — Assessment & Plan Note (Signed)
She has multifactorial anemia After blood transfusion, her hemoglobin has stabilized We will continue to monitor that carefully She is aware about risk of needing transfusion support while on treatment

## 2018-10-15 NOTE — Progress Notes (Signed)
DISCONTINUE OFF PATHWAY REGIMEN - [Other Dx]   [id number]:Cisplatin 40 mg/m2 weekly (4 weeks per order sheet):   Administer weekly:     Cisplatin   **Always confirm dose/schedule in your pharmacy ordering system**  REASON: Disease Progression PRIOR TREATMENT: Cisplatin 40 mg/m2 weekly (4 weeks per order sheet) TREATMENT RESPONSE: Progressive Disease (PD)  START OFF PATHWAY REGIMEN - [Other Dx]   [id number]:Carboplatin AUC=5 IV D1 + Paclitaxel 135 mg/m2 IV D1 q21 Days:   A cycle is every 21 days:     Paclitaxel      Carboplatin   **Always confirm dose/schedule in your pharmacy ordering system**  Patient Characteristics: Intent of Therapy: Non-Curative / Palliative Intent, Discussed with Patient

## 2018-10-15 NOTE — Telephone Encounter (Signed)
Gave avs and calendar ° °

## 2018-10-15 NOTE — Telephone Encounter (Signed)
Requested PD-L1 testing with Maudie Mercury in [facility].

## 2018-10-15 NOTE — Assessment & Plan Note (Signed)
She will continue antiretroviral therapy She would be at risk of infection while on chemotherapy

## 2018-10-15 NOTE — Assessment & Plan Note (Signed)
She has poor control of her cancer pain I recommend the addition of MS Contin along with immediate release morphine as needed I will assess pain control in the next visit

## 2018-10-15 NOTE — Progress Notes (Signed)
[place] OFFICE PROGRESS NOTE  Patient Care Team: Default, Provider, MD as PCP - General  ASSESSMENT & PLAN:  Cervical cancer [facility]) Unfortunately, the patient have signs of disease recurrence/progression Currently, we need to wait until hydronephrosis is fixed with surgery Treatment is strictly palliative in nature I will get pathologist to order PDL 1 testing to see if she will qualify for immunotherapy After she undergoes surgery, if she has no further bleeding, we can consider the addition of bevacizumab to treatment.  We reviewed the NCCN guidelines We discussed the role of chemotherapy. The intent is of palliative intent.  We discussed some of the risks, benefits, side-effects of carboplatin & Taxol. Treatment is intravenous, every 3 weeks x 6 cycles  Some of the short term side-effects included, though not limited to, including weight loss, life threatening infections, risk of allergic reactions, need for transfusions of blood products, nausea, vomiting, change in bowel habits, loss of hair, admission to hospital for various reasons, and risks of death.   Long term side-effects are also discussed including risks of infertility, permanent damage to nerve function, hearing loss, chronic fatigue, kidney damage with possibility needing hemodialysis, and rare secondary malignancy including bone marrow disorders.  The patient is aware that the response rates discussed earlier is not guaranteed.  After a long discussion, patient made an informed decision to proceed with the prescribed plan of care.   Patient education material was dispensed. We discussed premedication with dexamethasone before chemotherapy. With her young age, she would not need G-CSF support.  I will reduce the dose of treatment based on kidney function   Cancer associated pain She has poor control of her cancer pain I recommend the addition of MS Contin along with immediate release morphine as  needed I will assess pain control in the next visit  HIV (human immunodeficiency virus infection) ([place]) She will continue antiretroviral therapy She would be at risk of infection while on chemotherapy  Anemia, blood loss She has multifactorial anemia After blood transfusion, her hemoglobin has stabilized We will continue to monitor that carefully She is aware about risk of needing transfusion support while on treatment  Goals of care, counseling/discussion The patient is aware she has incurable disease and treatment is strictly palliative. We discussed importance of Advanced Directives and Living will.   No orders of the defined types were placed in this encounter.   INTERVAL HISTORY: Please see below for problem oriented charting. She returns with her husband for further follow-up She complained of poorly controlled pain despite being on regular pain medicine Her pain is centered around her back Denies nausea vomiting She continues to have frequent diarrhea The patient denies any recent signs or symptoms of bleeding such as spontaneous epistaxis, hematuria or hematochezia.   SUMMARY OF ONCOLOGIC HISTORY:   Cervical cancer ([place])   01/04/2017 Initial Diagnosis    She went to urgent care service for evaluation of abnormal vaginal symptoms with discharge and was diagnosed with bacterial vaginosis    01/03/2018 Imaging    1. Complex thick-walled fluid collection/abscess the lower pelvis with possible communication to the lower uterine/vagina. Clinical correlation and further evaluation with pelvic ultrasound recommended. CT with IV and delayed oral and rectal contrast may provide additional information. A smaller collection is also noted in the left posterior hemipelvis the left of the rectum. 2. No bowel obstruction or active inflammation.  Normal appendix. Please note the described 5.0 x 5.5 cm complex collection within the pelvis appears to be  in the region of the cervix and may  represent a necrotic mass/neoplasm. The described 3 x 3 cm low attenuating lesion to the left of the rectum may represent an abnormal lymph node.     01/04/2018 Pathology Results    Cervix, biopsy - POORLY DIFFERENTIATED CARCINOMA - SEE COMMENT Microscopic Comment The biopsy material consists of multiple fragments of tissue with infiltrative tumor and granulation tissue. By immunohistochemistry, the tumor is positive for cytokeratin 5/6, p63, p16 and vimentin (diffusely positive) but negative for ER. 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    01/04/2018 Imaging    Pelvic US 1. Heterogeneous area of irregularity with Doppler flow in the region of the cervix. A cervical mass is not excluded. Clinical correlation is recommended.  2. Rounded complex mass/collection posterior to the lower uterus/vagina corresponding to the larger complex collection seen on the earlier CT. This may represent a necrotic or purulent collection. 3. Grossly unremarkable left ovary. Nonvisualization the right ovary.    01/04/2018 Procedure    She underwent examination under anesthesia Procedure: pt taken to the operating room where gen anesthesia was performed. She was placed in the dorsal lithotomy position and prepped and draped in the usual sterile fashion. A bivalve speculum was placed in the pts vagina and a macerated cervix was noted. There was very little that resembled a normal cervix. The tissue was necrotic and malodorous.  Several biopsies were obtained. There was actually very little bleeding from the biopsy sites although there was bleeding from within the uterine cavity above the location of the cervix.       01/22/2018 PET scan    1. Large hypermetabolic cervical mass with local invasion into the perirectal space. Bilateral pelvic adenopathy noted with small but hypermetabolic pelvic lymph nodes compatible with malignancy. I do not see definite  hypermetabolic adenopathy in the upper abdomen, but there are scattered small bilateral hypermetabolic lymph nodes in the neck and axillary regions of uncertain significance. Given the low level activity and the skipped region in the abdomen, it is possible that the neck and chest lymph nodes are simply reactive, as usually I would expect to see more upper abdominal adenopathy were there involvement in the neck and chest. Surveillance is probably warranted. 2. Bilateral mild diffuse thyroid activity suggesting thyroiditis. 3. Other imaging findings of potential clinical significance: Aortic Atherosclerosis (ICD10-I70.0). Mild cardiomegaly. Bilateral iliac artery atherosclerosis. Suspected uterine fundal fibroid.     01/25/2018 Cancer Staging    Staging form: Cervix Uteri, AJCC 8th Edition - Clinical: FIGO Stage IVA (cT4, cN1, cM0) - Signed by [name], MD on 01/25/2018    02/01/2018 Procedure    Successful 8 French right internal jugular vein power port placement with its tip at the SVC/RA junction.    02/05/2018 - 04/18/2018 Radiation Therapy    Radiation treatment dates:   External therapy: 02/05/2018-03/16/2018, Brachytherapy: 03/20/2018, 03/26/2018, 04/04/2018, 04/12/2018, 04/18/2018  Site/dose:   1. Cervix/pelvis, 1.8 Gy in 25 fractions for a total dose of 45 Gy                      2. Sidewall/ parametrial Boost, 1.8 Gy in 5 fractions for a total dose of 9 Gy                      3. cervix, 5.5 Gy/fx,  5 fractions for a total dose of 27.5 Gy  Beams/energy:   1. 3D, 15X  2. 3D, 15X                              3. HDR, Ir-192, tandem ring system     02/09/2018 - 03/09/2018 Chemotherapy    The patient had weekly cisplatin    07/12/2018 PET scan    Significant response to therapy with decreased hypermetabolic cervical soft tissue mass, left perirectal/presacral soft tissue density, and bilateral iliac lymphadenopathy. No new or progressive metastatic disease  identified.  Increased mild right hydronephrosis noted.  Stable thyroiditis, hepatic steatosis, and small uterine fibroid    10/09/2018 PET scan    1. Degradation secondary to patient body habitus. 2. Disease progression, as evidenced by increase in hypermetabolism within the cervix and a perirectal area of soft tissue density and gas. Progressive left common iliac and new retroperitoneal abdominal hypermetabolism, suspicious for nodal metastasis. 3. Gas within the cervical region could be related to necrosis from radiation therapy. Fistulous communication to bowel could look similar. 4. Similar to mild increase in moderate right-sided hydronephrosis. 5. Thyroid hypermetabolism, again suggesting thyroiditis.    10/25/2018 -  Chemotherapy    The patient had PALONOSETRON HCL INJECTION 0.25 MG/5ML, 0.25 mg, Intravenous,  Once, 0 of 6 cycles CARBOplatin (PARAPLATIN) in sodium chloride 0.9 % 100 mL chemo infusion, , Intravenous,  Once, 0 of 6 cycles PACLitaxel (TAXOL) 336 mg in sodium chloride 0.9 % 500 mL chemo infusion (> 80mg /m2), 175 mg/m2, Intravenous,  Once, 0 of 6 cycles FOSAPREPITANT 150MG  + DEXAMETHASONE INFUSION [facility], , Intravenous,  Once, 0 of 6 cycles  for chemotherapy treatment.      REVIEW OF SYSTEMS:   Constitutional: Denies fevers, chills or abnormal weight loss Eyes: Denies blurriness of vision Ears, nose, mouth, throat, and face: Denies mucositis or sore throat Respiratory: Denies cough, dyspnea or wheezes Cardiovascular: Denies palpitation, chest discomfort or lower extremity swelling Skin: Denies abnormal skin rashes Lymphatics: Denies new lymphadenopathy or easy bruising Neurological:Denies numbness, tingling or new weaknesses Behavioral/Psych: Mood is stable, no new changes  All other systems were reviewed with the patient and are negative.  I have reviewed the past medical history, past surgical history, social history and family history with the patient and they  are unchanged from previous note.  ALLERGIES:  has No Known Allergies.  MEDICATIONS:  Current Outpatient Medications  Medication Sig Dispense Refill  . acetaminophen (TYLENOL) 500 MG tablet Take 500 mg by mouth every 6 (six) hours as needed.    Marland Kitchen BIKTARVY 50-200-25 MG TABS tablet TAKE 1 TABLET BY MOUTH DAILY. 30 tablet 5  . dexamethasone (DECADRON) 4 MG tablet Take 5 tabs at the night before and 5 tab the morning of chemotherapy, every 3 weeks, by mouth 60 tablet 0  . docusate sodium (COLACE) 100 MG capsule Take 100 mg by mouth every morning.     Marland Kitchen levothyroxine (SYNTHROID, LEVOTHROID) 25 MCG tablet Take 1 tablet (25 mcg total) by mouth daily before breakfast. 30 tablet 1  . magnesium oxide (MAG-OX) 400 (241.3 Mg) MG tablet Take 1 tablet (400 mg total) by mouth 2 (two) times daily. 60 tablet 3  . Misc Natural Products (ESTROVEN + ENERGY MAX STRENGTH PO) Take 1 tablet by mouth daily.    Marland Kitchen morphine (MS CONTIN) 60 MG 12 hr tablet Take 1 tablet (60 mg total) by mouth every 12 (twelve) hours. 60 tablet 0  . morphine (MSIR) 30 MG tablet Take 1 tablet (30 mg total) by  mouth every 6 (six) hours as needed for severe pain. 90 tablet 0  . ondansetron (ZOFRAN) 4 MG tablet Take 1 tablet (4 mg total) by mouth every 8 (eight) hours as needed for nausea or vomiting. 20 tablet 0   No current facility-administered medications for this visit.     PHYSICAL EXAMINATION: ECOG PERFORMANCE STATUS: 1 - Symptomatic but completely ambulatory  Vitals:   10/15/18 1138  BP: (!) 158/71  Pulse: (!) 122  Resp: 18  Temp: 98.5 F (36.9 C)  SpO2: 100%   Filed Weights   10/15/18 1138  Weight: 186 lb 6.4 oz (84.6 kg)    GENERAL:alert, no distress and comfortable SKIN: skin color, texture, turgor are normal, no rashes or significant lesions EYES: normal, Conjunctiva are pink and non-injected, sclera clear OROPHARYNX:no exudate, no erythema and lips, buccal mucosa, and tongue normal  NECK: supple, thyroid normal  size, non-tender, without nodularity LYMPH:  no palpable lymphadenopathy in the cervical, axillary or inguinal LUNGS: clear to auscultation and percussion with normal breathing effort HEART: regular rate & rhythm and no murmurs and no lower extremity edema ABDOMEN:abdomen soft, non-tender and normal bowel sounds Musculoskeletal:no cyanosis of digits and no clubbing  NEURO: alert & oriented x 3 with fluent speech, no focal motor/sensory deficits  LABORATORY DATA:  I have reviewed the data as listed    Component Value Date/Time   NA 141 10/15/2018 1125   K 3.5 10/15/2018 1125   CL 110 10/15/2018 1125   CO2 18 (L) 10/15/2018 1125   GLUCOSE 96 10/15/2018 1125   BUN 19 10/15/2018 1125   CREATININE 1.82 (H) 10/15/2018 1125   CREATININE 1.30 (H) 06/19/2018 1155   CALCIUM 9.6 10/15/2018 1125   PROT 8.9 (H) 10/15/2018 1125   ALBUMIN 3.0 (L) 10/15/2018 1125   AST 10 (L) 10/15/2018 1125   ALT <6 10/15/2018 1125   ALKPHOS 84 10/15/2018 1125   BILITOT 0.4 10/15/2018 1125   GFRNONAA 31 (L) 10/15/2018 1125   GFRNONAA 47 (L) 06/19/2018 1155   GFRAA 36 (L) 10/15/2018 1125   GFRAA 55 (L) 06/19/2018 1155    No results found for: SPEP, UPEP  Lab Results  Component Value Date   WBC 13.9 (H) 10/15/2018   NEUTROABS 12.3 (H) 10/15/2018   HGB 8.6 (L) 10/15/2018   HCT 26.7 (L) 10/15/2018   MCV 93.7 10/15/2018   PLT 586 (H) 10/15/2018      Chemistry      Component Value Date/Time   NA 141 10/15/2018 1125   K 3.5 10/15/2018 1125   CL 110 10/15/2018 1125   CO2 18 (L) 10/15/2018 1125   BUN 19 10/15/2018 1125   CREATININE 1.82 (H) 10/15/2018 1125   CREATININE 1.30 (H) 06/19/2018 1155      Component Value Date/Time   CALCIUM 9.6 10/15/2018 1125   ALKPHOS 84 10/15/2018 1125   AST 10 (L) 10/15/2018 1125   ALT <6 10/15/2018 1125   BILITOT 0.4 10/15/2018 1125       RADIOGRAPHIC STUDIES: I have reviewed imaging study with the patient and her husband I have personally reviewed the  radiological images as listed and agreed with the findings in the report. Nm Pet Image Restag (ps) Skull Base To Thigh  Result Date: 10/09/2018 CLINICAL DATA:  Subsequent treatment strategy for cervical cancer with periaortic lymph node metastasis. Radiation therapy in June. EXAM: NUCLEAR MEDICINE PET SKULL BASE TO THIGH TECHNIQUE: 10.8 mCi F-18 FDG was injected intravenously. Full-ring PET imaging was performed from the  skull base to thigh after the radiotracer. CT data was obtained and used for attenuation correction and anatomic localization. Fasting blood glucose: 81 mg/dl COMPARISON:  07/11/2018 FINDINGS: Mild to moderate degradation secondary to patient body habitus. Mediastinal blood pool activity: SUV max 3.3 NECK: Muscular activity within the neck. Diffuse thyroid hypermetabolism again identified, including at a S.U.V. max of 10.3. No cervical nodal hypermetabolism. Incidental CT findings: No cervical adenopathy. CHEST: No thoracic nodal hypermetabolism. No pulmonary parenchymal hypermetabolism. Incidental CT findings: Right Port-A-Cath tip at high right atrium. Tiny hiatal hernia. ABDOMEN/PELVIS: Foci of retrocaval hypermetabolism may be due to small nodes. Examples including at a S.U.V. max of 9.4 on image 116. Hypermetabolism along the left common iliac chain measures a S.U.V. max of 5.9 on image 134/4 versus a S.U.V. max of 3.1 at the same level on the prior exam (when remeasured). Left perirectal hypermetabolism and soft tissue fullness/gas are difficult to differentiate from the adjacent rectum today. Felt to measure on the order of 3.2 cm and a S.U.V. max of 12.6 on image 160/4. Compare 3.0 cm and a S.U.V. max of 5.6 on the prior. Hypermetabolism within the lower uterine segment and cervical region. This measures a S.U.V. max of 10.9 today, and demonstrates central gas on image 153/4. Compare a S.U.V. max of 4.7 on the prior. Incidental CT findings: Mild-to-moderate hydronephrosis, similar to  minimally increased. Low-density left renal lesion is likely a cyst. Large colonic stool burden. SKELETON: Diffuse marrow hypermetabolism could be related to stimulation by chemotherapy. Incidental CT findings: none IMPRESSION: 1. Degradation secondary to patient body habitus. 2. Disease progression, as evidenced by increase in hypermetabolism within the cervix and a perirectal area of soft tissue density and gas. Progressive left common iliac and new retroperitoneal abdominal hypermetabolism, suspicious for nodal metastasis. 3. Gas within the cervical region could be related to necrosis from radiation therapy. Fistulous communication to bowel could look similar. 4. Similar to mild increase in moderate right-sided hydronephrosis. 5. Thyroid hypermetabolism, again suggesting thyroiditis. Electronically Signed   By: Abigail Miyamoto M.D.   On: 10/09/2018 15:07    All questions were answered. The patient knows to call the clinic with any problems, questions or concerns. No barriers to learning was detected.  I spent 55 minutes counseling the patient face to face. The total time spent in the appointment was 60 minutes and more than 50% was on counseling and review of test results  [name], MD 10/15/2018 1:34 PM

## 2018-10-15 NOTE — Assessment & Plan Note (Addendum)
Unfortunately, the patient have signs of disease recurrence/progression Currently, we need to wait until hydronephrosis is fixed with surgery Treatment is strictly palliative in nature I will get pathologist to order PDL 1 testing to see if she will qualify for immunotherapy After she undergoes surgery, if she has no further bleeding, we can consider the addition of bevacizumab to treatment.  We reviewed the NCCN guidelines We discussed the role of chemotherapy. The intent is of palliative intent.  We discussed some of the risks, benefits, side-effects of carboplatin & Taxol. Treatment is intravenous, every 3 weeks x 6 cycles  Some of the short term side-effects included, though not limited to, including weight loss, life threatening infections, risk of allergic reactions, need for transfusions of blood products, nausea, vomiting, change in bowel habits, loss of hair, admission to hospital for various reasons, and risks of death.   Long term side-effects are also discussed including risks of infertility, permanent damage to nerve function, hearing loss, chronic fatigue, kidney damage with possibility needing hemodialysis, and rare secondary malignancy including bone marrow disorders.  The patient is aware that the response rates discussed earlier is not guaranteed.  After a long discussion, patient made an informed decision to proceed with the prescribed plan of care.   Patient education material was dispensed. We discussed premedication with dexamethasone before chemotherapy. With her young age, she would not need G-CSF support.  I will reduce the dose of treatment based on kidney function

## 2018-10-15 NOTE — Assessment & Plan Note (Signed)
The patient is aware she has incurable disease and treatment is strictly palliative. We discussed importance of Advanced Directives and Living will. 

## 2018-10-16 ENCOUNTER — Ambulatory Visit: Payer: BLUE CROSS/BLUE SHIELD | Admitting: Hematology and Oncology

## 2018-10-16 ENCOUNTER — Other Ambulatory Visit: Payer: BLUE CROSS/BLUE SHIELD

## 2018-10-17 ENCOUNTER — Other Ambulatory Visit (HOSPITAL_COMMUNITY)
Admission: RE | Admit: 2018-10-17 | Discharge: 2018-10-17 | Disposition: A | Discharge status: Home / Self Care | Payer: BLUE CROSS/BLUE SHIELD | Source: Ambulatory Visit | Attending: Hematology and Oncology | Admitting: Hematology and Oncology

## 2018-10-17 DIAGNOSIS — C539 Malignant neoplasm of cervix uteri, unspecified: Secondary | ICD-10-CM | POA: Insufficient documentation

## 2018-10-19 NOTE — Patient Instructions (Signed)
Misty Thomas  10/19/2018   Your procedure is scheduled on:  10-24-18  Report to [facility] Main  Entrance  Report to admitting at     [place] AM    Call this number if you have problems the morning of surgery [phone]    Remember: Do not eat food or drink liquids :After Midnight.  BRUSH YOUR TEETH MORNING OF SURGERY AND RINSE YOUR MOUTH OUT, NO CHEWING GUM CANDY OR MINTS.     Take these medicines the morning of surgery with A SIP OF WATER: Biktarvy, tylenol if needed DO NOT TAKE ANY DIABETIC MEDICATIONS DAY OF YOUR SURGERY                               You may not have any metal on your body including hair pins and              piercings  Do not wear jewelry, make-up, lotions, powders or perfumes, deodorant             Do not wear nail polish.  Do not shave  48 hours prior to surgery.                Do not bring valuables to the hospital. [place].  Contacts, dentures or bridgework may not be worn into surgery.      Patients discharged the day of surgery will not be allowed to drive home.  Name and phone number of your driver:  Special Instructions: N/A              Please read over the following fact sheets you were given: _____________________________________________________________________             [facility] - Preparing for Surgery Before surgery, you can play an important role.  Because skin is not sterile, your skin needs to be as free of germs as possible.  You can reduce the number of germs on your skin by washing with CHG (chlorahexidine gluconate) soap before surgery.  CHG is an antiseptic cleaner which kills germs and bonds with the skin to continue killing germs even after washing. Please DO NOT use if you have an allergy to CHG or antibacterial soaps.  If your skin becomes reddened/irritated stop using the CHG and inform your nurse when you arrive at Short Stay. Do not shave  (including legs and underarms) for at least 48 hours prior to the first CHG shower.  You may shave your face/neck. Please follow these instructions carefully:  1.  Shower with CHG Soap the night before surgery and the  morning of Surgery.  2.  If you choose to wash your hair, wash your hair first as usual with your  normal  shampoo.  3.  After you shampoo, rinse your hair and body thoroughly to remove the  shampoo.                           4.  Use CHG as you would any other liquid soap.  You can apply chg directly  to the skin and wash  Gently with a scrungie or clean washcloth.  5.  Apply the CHG Soap to your body ONLY FROM THE NECK DOWN.   Do not use on face/ open                           Wound or open sores. Avoid contact with eyes, ears mouth and genitals (private parts).                       Wash face,  Genitals (private parts) with your normal soap.             6.  Wash thoroughly, paying special attention to the area where your surgery  will be performed.  7.  Thoroughly rinse your body with warm water from the neck down.  8.  DO NOT shower/wash with your normal soap after using and rinsing off  the CHG Soap.                9.  Pat yourself dry with a clean towel.            10.  Wear clean pajamas.            11.  Place clean sheets on your bed the night of your first shower and do not  sleep with pets. Day of Surgery : Do not apply any lotions/deodorants the morning of surgery.  Please wear clean clothes to the hospital/surgery center.  FAILURE TO FOLLOW THESE INSTRUCTIONS MAY RESULT IN THE CANCELLATION OF YOUR SURGERY PATIENT SIGNATURE_________________________________  NURSE SIGNATURE__________________________________  ________________________________________________________________________

## 2018-10-23 ENCOUNTER — Encounter (HOSPITAL_COMMUNITY): Payer: Self-pay

## 2018-10-23 ENCOUNTER — Encounter (HOSPITAL_COMMUNITY)
Admission: RE | Admit: 2018-10-23 | Discharge: 2018-10-23 | Disposition: A | Discharge status: Home / Self Care | Payer: BLUE CROSS/BLUE SHIELD | Source: Ambulatory Visit | Attending: Urology | Admitting: Urology

## 2018-10-23 ENCOUNTER — Other Ambulatory Visit: Payer: Self-pay

## 2018-10-23 DIAGNOSIS — Z01812 Encounter for preprocedural laboratory examination: Secondary | ICD-10-CM | POA: Insufficient documentation

## 2018-10-23 LAB — CBC
HEMATOCRIT: 27.7 % — AB (ref 36.0–46.0)
Hemoglobin: 8.5 g/dL — ABNORMAL LOW (ref 12.0–15.0)
MCH: 30.1 pg (ref 26.0–34.0)
MCHC: 30.7 g/dL (ref 30.0–36.0)
MCV: 98.2 fL (ref 80.0–100.0)
Platelets: 765 10*3/uL — ABNORMAL HIGH (ref 150–400)
RBC: 2.82 MIL/uL — ABNORMAL LOW (ref 3.87–5.11)
RDW: 15.5 % (ref 11.5–15.5)
WBC: 15.4 10*3/uL — ABNORMAL HIGH (ref 4.0–10.5)
nRBC: 0 % (ref 0.0–0.2)

## 2018-10-23 LAB — BASIC METABOLIC PANEL
Anion gap: 12 (ref 5–15)
BUN: 33 mg/dL — AB (ref 6–20)
CO2: 17 mmol/L — AB (ref 22–32)
Calcium: 9.7 mg/dL (ref 8.9–10.3)
Chloride: 106 mmol/L (ref 98–111)
Creatinine, Ser: 2.18 mg/dL — ABNORMAL HIGH (ref 0.44–1.00)
GFR calc Af Amer: 29 mL/min — ABNORMAL LOW (ref >60)
GFR calc non Af Amer: 25 mL/min — ABNORMAL LOW (ref >60)
GLUCOSE: 111 mg/dL — AB (ref 70–99)
Potassium: 4.1 mmol/L (ref 3.5–5.1)
Sodium: 135 mmol/L (ref 135–145)

## 2018-10-23 LAB — HEMOGLOBIN A1C
Hgb A1c MFr Bld: 5.5 % (ref 4.8–5.6)
Mean Plasma Glucose: 111.15 mg/dL

## 2018-10-23 NOTE — Progress Notes (Signed)
ekg 03-10-18 epic

## 2018-10-23 NOTE — Progress Notes (Addendum)
Cbc and cmp   Done 10-23-18  routed to Dr. Louis Meckel via epic. Also LVM with Darrel Reach scheduler for Dr. Louis Meckel concerning labs.

## 2018-10-24 ENCOUNTER — Encounter (HOSPITAL_COMMUNITY)
Admission: RE | Disposition: A | Discharge status: Home / Self Care | Payer: Self-pay | Source: Home / Self Care | Attending: Urology

## 2018-10-24 ENCOUNTER — Ambulatory Visit (HOSPITAL_COMMUNITY): Payer: BLUE CROSS/BLUE SHIELD | Admitting: Certified Registered"

## 2018-10-24 ENCOUNTER — Encounter (HOSPITAL_COMMUNITY): Payer: Self-pay

## 2018-10-24 ENCOUNTER — Ambulatory Visit (HOSPITAL_COMMUNITY): Payer: BLUE CROSS/BLUE SHIELD

## 2018-10-24 ENCOUNTER — Ambulatory Visit (HOSPITAL_COMMUNITY)
Admission: RE | Admit: 2018-10-24 | Discharge: 2018-10-24 | Disposition: A | Discharge status: Home / Self Care | Payer: BLUE CROSS/BLUE SHIELD | Attending: Urology | Admitting: Urology

## 2018-10-24 ENCOUNTER — Telehealth: Payer: Self-pay | Admitting: Hematology and Oncology

## 2018-10-24 DIAGNOSIS — Z79891 Long term (current) use of opiate analgesic: Secondary | ICD-10-CM | POA: Insufficient documentation

## 2018-10-24 DIAGNOSIS — N189 Chronic kidney disease, unspecified: Secondary | ICD-10-CM | POA: Insufficient documentation

## 2018-10-24 DIAGNOSIS — C530 Malignant neoplasm of endocervix: Secondary | ICD-10-CM

## 2018-10-24 DIAGNOSIS — E119 Type 2 diabetes mellitus without complications: Secondary | ICD-10-CM | POA: Diagnosis not present

## 2018-10-24 DIAGNOSIS — E039 Hypothyroidism, unspecified: Secondary | ICD-10-CM | POA: Insufficient documentation

## 2018-10-24 DIAGNOSIS — N131 Hydronephrosis with ureteral stricture, not elsewhere classified: Secondary | ICD-10-CM

## 2018-10-24 DIAGNOSIS — Z923 Personal history of irradiation: Secondary | ICD-10-CM | POA: Insufficient documentation

## 2018-10-24 DIAGNOSIS — Z8541 Personal history of malignant neoplasm of cervix uteri: Secondary | ICD-10-CM | POA: Insufficient documentation

## 2018-10-24 DIAGNOSIS — Z9221 Personal history of antineoplastic chemotherapy: Secondary | ICD-10-CM | POA: Diagnosis not present

## 2018-10-24 DIAGNOSIS — C799 Secondary malignant neoplasm of unspecified site: Secondary | ICD-10-CM | POA: Diagnosis not present

## 2018-10-24 DIAGNOSIS — Z79899 Other long term (current) drug therapy: Secondary | ICD-10-CM | POA: Insufficient documentation

## 2018-10-24 DIAGNOSIS — Z79890 Hormone replacement therapy (postmenopausal): Secondary | ICD-10-CM | POA: Insufficient documentation

## 2018-10-24 DIAGNOSIS — B20 Human immunodeficiency virus [HIV] disease: Secondary | ICD-10-CM | POA: Diagnosis not present

## 2018-10-24 DIAGNOSIS — D61818 Other pancytopenia: Secondary | ICD-10-CM

## 2018-10-24 HISTORY — PX: CYSTOSCOPY W/ URETERAL STENT PLACEMENT: SHX1429

## 2018-10-24 LAB — GLUCOSE, CAPILLARY
Glucose-Capillary: 102 mg/dL — ABNORMAL HIGH (ref 70–99)
Glucose-Capillary: 92 mg/dL (ref 70–99)

## 2018-10-24 SURGERY — CYSTOSCOPY, WITH RETROGRADE PYELOGRAM AND URETERAL STENT INSERTION
Anesthesia: General | Laterality: Bilateral

## 2018-10-24 MED ORDER — FENTANYL CITRATE (PF) 100 MCG/2ML IJ SOLN
INTRAMUSCULAR | Status: AC
  Filled 2018-10-24: qty 2

## 2018-10-24 MED ORDER — DEXAMETHASONE SODIUM PHOSPHATE 10 MG/ML IJ SOLN
INTRAMUSCULAR | Status: AC
  Filled 2018-10-24: qty 1

## 2018-10-24 MED ORDER — BELLADONNA ALKALOIDS-OPIUM 16.2-30 MG RE SUPP
RECTAL | Status: AC
  Filled 2018-10-24: qty 1

## 2018-10-24 MED ORDER — ONDANSETRON HCL 4 MG/2ML IJ SOLN
INTRAMUSCULAR | Status: AC
  Filled 2018-10-24: qty 2

## 2018-10-24 MED ORDER — FENTANYL CITRATE (PF) 100 MCG/2ML IJ SOLN
25.0000 ug | INTRAMUSCULAR | Status: DC | PRN
  Administered 2018-10-24 (×2): 50 ug via INTRAVENOUS

## 2018-10-24 MED ORDER — DEXAMETHASONE SODIUM PHOSPHATE 10 MG/ML IJ SOLN
INTRAMUSCULAR | Status: DC | PRN
  Administered 2018-10-24: 4 mg via INTRAVENOUS

## 2018-10-24 MED ORDER — IOHEXOL 300 MG/ML  SOLN
INTRAMUSCULAR | Status: DC | PRN
  Administered 2018-10-24: 10 mL

## 2018-10-24 MED ORDER — LIDOCAINE 2% (20 MG/ML) 5 ML SYRINGE
INTRAMUSCULAR | Status: AC
  Filled 2018-10-24: qty 5

## 2018-10-24 MED ORDER — MIDAZOLAM HCL 2 MG/2ML IJ SOLN
INTRAMUSCULAR | Status: DC | PRN
  Administered 2018-10-24: 2 mg via INTRAVENOUS

## 2018-10-24 MED ORDER — FENTANYL CITRATE (PF) 250 MCG/5ML IJ SOLN
INTRAMUSCULAR | Status: DC | PRN
  Administered 2018-10-24: 100 ug via INTRAVENOUS

## 2018-10-24 MED ORDER — MIDAZOLAM HCL 2 MG/2ML IJ SOLN
INTRAMUSCULAR | Status: AC
  Filled 2018-10-24: qty 2

## 2018-10-24 MED ORDER — BELLADONNA ALKALOIDS-OPIUM 16.2-60 MG RE SUPP
RECTAL | Status: DC | PRN
  Administered 2018-10-24: 1 via RECTAL

## 2018-10-24 MED ORDER — EPHEDRINE 5 MG/ML INJ
INTRAVENOUS | Status: AC
  Filled 2018-10-24: qty 10

## 2018-10-24 MED ORDER — ONDANSETRON HCL 4 MG/2ML IJ SOLN
INTRAMUSCULAR | Status: DC | PRN
  Administered 2018-10-24: 4 mg via INTRAVENOUS

## 2018-10-24 MED ORDER — CEFAZOLIN SODIUM-DEXTROSE 2-4 GM/100ML-% IV SOLN
2.0000 g | INTRAVENOUS | Status: AC
  Administered 2018-10-24: 2 g via INTRAVENOUS
  Filled 2018-10-24: qty 100

## 2018-10-24 MED ORDER — ACETAMINOPHEN 325 MG PO TABS
650.0000 mg | ORAL_TABLET | Freq: Four times a day (QID) | ORAL | Status: DC | PRN
  Administered 2018-10-24: 650 mg via ORAL

## 2018-10-24 MED ORDER — PROPOFOL 10 MG/ML IV BOLUS
INTRAVENOUS | Status: AC
  Filled 2018-10-24: qty 40

## 2018-10-24 MED ORDER — ACETAMINOPHEN 325 MG PO TABS
ORAL_TABLET | ORAL | Status: AC
  Filled 2018-10-24: qty 2

## 2018-10-24 MED ORDER — LIDOCAINE 2% (20 MG/ML) 5 ML SYRINGE
INTRAMUSCULAR | Status: DC | PRN
  Administered 2018-10-24: 80 mg via INTRAVENOUS

## 2018-10-24 MED ORDER — HYDROMORPHONE HCL 1 MG/ML IJ SOLN
INTRAMUSCULAR | Status: AC
  Filled 2018-10-24: qty 1

## 2018-10-24 MED ORDER — LACTATED RINGERS IV SOLN
INTRAVENOUS | Status: DC
  Administered 2018-10-24: 09:00:00 via INTRAVENOUS

## 2018-10-24 MED ORDER — ARTIFICIAL TEARS OPHTHALMIC OINT
TOPICAL_OINTMENT | OPHTHALMIC | Status: AC
  Filled 2018-10-24: qty 3.5

## 2018-10-24 MED ORDER — HYDROMORPHONE HCL 1 MG/ML IJ SOLN
0.2500 mg | INTRAMUSCULAR | Status: DC | PRN
  Administered 2018-10-24 (×4): 0.5 mg via INTRAVENOUS

## 2018-10-24 MED ORDER — SODIUM CHLORIDE 0.9 % IR SOLN
Status: DC | PRN
  Administered 2018-10-24: 6000 mL

## 2018-10-24 MED ORDER — PHENAZOPYRIDINE HCL 200 MG PO TABS: 200.0000 mg | ORAL_TABLET | Freq: Three times a day (TID) | ORAL | 0 refills | Status: DC | PRN

## 2018-10-24 MED ORDER — PROPOFOL 10 MG/ML IV BOLUS
INTRAVENOUS | Status: DC | PRN
  Administered 2018-10-24: 200 mg via INTRAVENOUS

## 2018-10-24 MED ORDER — ONDANSETRON HCL 4 MG/2ML IJ SOLN: 4.0000 mg | Freq: Once | INTRAMUSCULAR | Status: DC | PRN

## 2018-10-24 SURGICAL SUPPLY — 20 items
BAG URO CATCHER STRL LF (MISCELLANEOUS) ×3 IMPLANT
BASKET ZERO TIP NITINOL 2.4FR (BASKET) IMPLANT
BSKT STON RTRVL ZERO TP 2.4FR (BASKET)
CATH URET 5FR 28IN OPEN ENDED (CATHETERS) ×3 IMPLANT
CLOTH BEACON ORANGE TIMEOUT ST (SAFETY) ×3 IMPLANT
COVER WAND RF STERILE (DRAPES) IMPLANT
EXTRACTOR STONE 1.7FRX115CM (UROLOGICAL SUPPLIES) IMPLANT
GLOVE BIOGEL M STRL SZ7.5 (GLOVE) ×3 IMPLANT
GOWN STRL REUS W/TWL XL LVL3 (GOWN DISPOSABLE) ×3 IMPLANT
GUIDEWIRE ANG ZIPWIRE 038X150 (WIRE) IMPLANT
GUIDEWIRE STR DUAL SENSOR (WIRE) ×3 IMPLANT
MANIFOLD NEPTUNE II (INSTRUMENTS) ×3 IMPLANT
PACK CYSTO (CUSTOM PROCEDURE TRAY) ×3 IMPLANT
SHEATH URETERAL 12FRX28CM (UROLOGICAL SUPPLIES) IMPLANT
SHEATH URETERAL 12FRX35CM (MISCELLANEOUS) IMPLANT
STENT URO INLAY 6FRX24CM (STENTS) ×2 IMPLANT
TUBING CONNECTING 10 (TUBING) ×2 IMPLANT
TUBING CONNECTING 10' (TUBING) ×1
TUBING UROLOGY SET (TUBING) ×3 IMPLANT
WIRE COONS/BENSON .038X145CM (WIRE) IMPLANT

## 2018-10-24 NOTE — Telephone Encounter (Signed)
Printed medical records from beginning of consultation to present while family members waited.

## 2018-10-24 NOTE — Discharge Instructions (Signed)
DISCHARGE INSTRUCTIONS FOR KIDNEY STONE/URETERAL STENT   MEDICATIONS:  1.  Resume all your other meds from home.  2. Pyridium is to help with the burning/stinging when you urinate.  ACTIVITY:  1. No strenuous activity x 1week  2. No driving while on narcotic pain medications  3. Drink plenty of water  4. Continue to walk at home - you can still get blood clots when you are at home, so keep active, but don't over do it.  5. May return to work/school tomorrow or when you feel ready   BATHING:  1. You can shower and we recommend daily showers   SIGNS/SYMPTOMS TO CALL:  Please call us if you have a fever greater than 101.5, uncontrolled nausea/vomiting, uncontrolled pain, dizziness, unable to urinate, bloody urine, chest pain, shortness of breath, leg swelling, leg pain, redness around wound, drainage from wound, or any other concerns or questions.   You can reach [name] at [phone].   FOLLOW-UP:  1. We will follow-up in 3 months to discuss stent exchange.

## 2018-10-24 NOTE — Anesthesia Postprocedure Evaluation (Signed)
Anesthesia Post Note  Patient: Misty Thomas  Procedure(s) Performed: CYSTOSCOPY WITH BILATERAL RETROGRADE PYELOGRAM RIGHT Wyvonnia Dusky STENT PLACEMENT (Bilateral )     Patient location during evaluation: PACU Anesthesia Type: General Level of consciousness: awake and alert Pain management: pain level controlled Vital Signs Assessment: post-procedure vital signs reviewed and stable Respiratory status: spontaneous breathing, nonlabored ventilation, respiratory function stable and patient connected to nasal cannula oxygen Cardiovascular status: blood pressure returned to baseline and stable Postop Assessment: no apparent nausea or vomiting Anesthetic complications: no    Last Vitals:  Vitals:   10/24/18 1245 10/24/18 1255  BP: (!) 147/83 140/86  Pulse:  84  Resp: 11 (!) 21  Temp:  37 C  SpO2: 100% 98%    Last Pain:  Vitals:   10/24/18 1255  TempSrc:   PainSc: 3                  Ryan P Ellender

## 2018-10-24 NOTE — Interval H&P Note (Signed)
History and Physical Interval Note:  10/24/2018 11:01 AM  Misty Thomas  has presented today for surgery, with the diagnosis of RIGHT HYDRONEPHROSIS  The various methods of treatment have been discussed with the patient and family. After consideration of risks, benefits and other options for treatment, the patient has consented to  Procedure(s): [place] (Bilateral) as a surgical intervention .  The patient's history has been reviewed, patient examined, no change in status, stable for surgery.  I have reviewed the patient's chart and labs.  Questions were answered to the patient's satisfaction.     Ardis Hughs

## 2018-10-24 NOTE — Anesthesia Preprocedure Evaluation (Addendum)
Anesthesia Evaluation  Patient identified by MRN, date of birth, ID band Patient awake    Reviewed: Allergy & Precautions, NPO status , Patient's Chart, lab work & pertinent test results  Airway Mallampati: II  TM Distance: >3 FB Neck ROM: Full    Dental no notable dental hx.    Pulmonary neg pulmonary ROS,    Pulmonary exam normal breath sounds clear to auscultation       Cardiovascular negative cardio ROS Normal cardiovascular exam Rhythm:Regular Rate:Normal  ECG: NSR, rate 81   Neuro/Psych negative neurological ROS  negative psych ROS   GI/Hepatic negative GI ROS, (+)     substance abuse  ,   Endo/Other  diabetesHypothyroidism   Renal/GU CRFRenal disease     Musculoskeletal  (+) narcotic dependentCancer associated pain   Abdominal (+) + obese,   Peds  Hematology  (+) anemia , HIV, Acquired pancytopenia  Thrombocytopenia  AIDS, CD4 : 130   Anesthesia Other Findings RIGHT HYDRONEPHROSIS  Reproductive/Obstetrics                            Anesthesia Physical Anesthesia Plan  ASA: IV  Anesthesia Plan: General   Post-op Pain Management:    Induction: Intravenous  PONV Risk Score and Plan: 3 and Ondansetron, Dexamethasone and Treatment may vary due to age or medical condition  Airway Management Planned: LMA  Additional Equipment:   Intra-op Plan:   Post-operative Plan: Extubation in OR  Informed Consent: I have reviewed the patients History and Physical, chart, labs and discussed the procedure including the risks, benefits and alternatives for the proposed anesthesia with the patient or authorized representative who has indicated his/her understanding and acceptance.   Dental advisory given  Plan Discussed with: CRNA  Anesthesia Plan Comments:        Anesthesia Quick Evaluation

## 2018-10-24 NOTE — Transfer of Care (Signed)
Immediate Anesthesia Transfer of Care Note  Patient: Misty Thomas  Procedure(s) Performed: CYSTOSCOPY WITH BILATERAL RETROGRADE PYELOGRAM RIGHT Wyvonnia Dusky STENT PLACEMENT (Bilateral )  Patient Location: PACU  Anesthesia Type:General  Level of Consciousness: sedated  Airway & Oxygen Therapy: Patient Spontanous Breathing and Patient connected to face mask oxygen  Post-op Assessment: Report given to RN and Post -op Vital signs reviewed and stable  Post vital signs: Reviewed and stable  Last Vitals:  Vitals Value Taken Time  BP 132/68 10/24/2018 11:49 AM  Temp    Pulse 104 10/24/2018 11:50 AM  Resp 17 10/24/2018 11:50 AM  SpO2 97 % 10/24/2018 11:50 AM  Vitals shown include unvalidated device data.  Last Pain:  Vitals:   10/24/18 0842  TempSrc:   PainSc: 4       Patients Stated Pain Goal: 3 (02/08/58 1368)  Complications: No apparent anesthesia complications

## 2018-10-24 NOTE — H&P (Signed)
hydronephrosis.  HPI: Misty Thomas is a 52 year-old female patient who was referred by Dr. [name], MD who is here for hydronephrosis.  The problem is on the right side. She is having pain. She is currently having back pain. She denies having flank pain, groin pain, nausea, vomiting, fever, chills, and voiding symptoms.   The patient has had recent labs.   She has not had previous abdominal surgery. She has not had a stent placed in her kidney. She has received radiation therapy. The patient does not have a history of recurrent UTIs.   The patient has a history of cervical cancer and had radiation in June. She also had concomitant chemotherapy. She had a follow-up PET-CT scan demonstrates a progression of her disease and right-sided hydronephrosis. She is complaining of some mild right-sided pain. She denies any fevers or chills. She has persistent diarrhea and urinaey frequency.     ALLERGIES: No Known Drug Allergies    MEDICATIONS: No Medications    GU PSH: None   NON-GU PSH: Bilateral Tubal Ligation    GU PMH: Malig Neo Cervix Weyman Rodney    NON-GU PMH: None   FAMILY HISTORY: No Family History    SOCIAL HISTORY: No Social History    REVIEW OF SYSTEMS:    GU Review Female:   Patient denies frequent urination, hard to postpone urination, burning /pain with urination, get up at night to urinate, leakage of urine, stream starts and stops, trouble starting your stream, have to strain to urinate, and being pregnant.  Gastrointestinal (Upper):   Patient denies nausea, indigestion/ heartburn, and vomiting.  Gastrointestinal (Lower):   Patient denies diarrhea and constipation.  Constitutional:   Patient denies fever, night sweats, weight loss, and fatigue.  Skin:   Patient denies skin rash/ lesion and itching.  Eyes:   Patient denies blurred vision and double vision.  Ears/ Nose/ Throat:   Patient denies sore throat and sinus problems.  Hematologic/Lymphatic:   Patient denies  swollen glands and easy bruising.  Cardiovascular:   Patient denies leg swelling and chest pains.  Respiratory:   Patient denies cough and shortness of breath.  Endocrine:   Patient denies excessive thirst.  Musculoskeletal:   Patient denies back pain and joint pain.  Neurological:   Patient denies headaches and dizziness.  Psychologic:   Patient denies depression and anxiety.   VITAL SIGNS:      10/11/2018 12:48 PM  Weight 193.1 lb / 87.59 kg  Height 61 in / 154.94 cm  BP 127/84 mmHg  Pulse 105 /min  Temperature 99.1 F / 37.2 C  BMI 36.5 kg/m   MULTI-SYSTEM PHYSICAL EXAMINATION:    Constitutional: Well-nourished. No physical deformities. Normally developed. Good grooming.  Neck: Neck symmetrical, not swollen. Normal tracheal position.  Respiratory: Normal breath sounds. No labored breathing, no use of accessory muscles.   Cardiovascular: Regular rate and rhythm. No murmur, no gallop. Normal temperature, normal extremity pulses, no swelling, no varicosities.   Lymphatic: No enlargement of neck, axillae, groin.  Skin: No paleness, no jaundice, no cyanosis. No lesion, no ulcer, no rash.  Neurologic / Psychiatric: Oriented to time, oriented to place, oriented to person. No depression, no anxiety, no agitation.  Gastrointestinal: No mass, no tenderness, no rigidity, non obese abdomen.  Eyes: Normal conjunctivae. Normal eyelids.  Ears, Nose, Mouth, and Throat: Left ear no scars, no lesions, no masses. Right ear no scars, no lesions, no masses. Nose no scars, no lesions, no masses. Normal hearing. Normal  lips.  Musculoskeletal: Normal gait and station of head and neck.     PAST DATA REVIEWED:  Source Of History:  Patient  Records Review:   Previous Doctor Records, Previous Patient Records, POC Tool  X-Ray Review: C.T. Abdomen/Pelvis: Reviewed Films. Discussed With Patient.     PROCEDURES: None   ASSESSMENT:      ICD-10 Details  1 GU:   Ureteral obstruction - N13.1    PLAN:            Document Letter(s):  Created for Patient: Clinical Summary         Notes:   The patient has right-sided hydroureteronephrosis secondary to a likely extrinsic compression or malignant obstruction around her cervix/uterus. I discussed options for her and strongly encouraged that she consider allowing me to to the operating room for cystoscopy, bilateral retrograde pyelogram, right ureteral stent placement. This would hopefully alleviate her obstruction. We did talk about possibility of not being able to find the ureteral orifice given the tissue derangement in malformations. We also discussed the possibility of stent failure. However, think this is the 1st step. She understands that if I am unable to place a stent that she may need a right nephrostomy tube. Further, the patient is following up with her oncologist to review there results of her PET-CT scan more completely and discuss any additional treatment options.

## 2018-10-24 NOTE — Op Note (Signed)
Preoperative diagnosis:  1. Metastatic cervical cancer 2. Right ureteral obstruction with associated hydronephrosis  Postoperative diagnosis:  1. Same  Procedure: 1. Cystoscopy, bilateral retrograde pyelogram with interpretation 2. Right ureteral stent placement   Surgeon: Ardis Hughs, MD  Anesthesia: General  Complications: None  Intraoperative findings: #1:  Cystoscopy demonstrated some bullous edema within the bladder, but no discrete or obvious mucosal lesions.  The ureters were orthotopic. #2: Left retrograde pyelogram demonstrated some slight narrowing within the distal ureter, but there was no significant hydroureteronephrosis. #3: The right retrograde pyelogram demonstrated a dense 3 cm narrow stricture of the distal ureter and UVJ.  There was also a narrowing in the mid ureter just above the pelvic inlet that was approximately 2-1/2 cm long. #4: A 24 cm x 6 French Bard double-J inlay stent was placed in the patient's right ureter without complication. #5: An exam under anesthesia demonstrated a very hard and mobile and nodular lesion on the anterior rectal wall.   EBL: Minimal  Specimens: None  Indication: Misty Thomas is a 52 y.o. patient with metastatic cervical cancer with right-sided hydroureteronephrosis.  After reviewing the management options for treatment, he elected to proceed with the above surgical procedure(s). We have discussed the potential benefits and risks of the procedure, side effects of the proposed treatment, the likelihood of the patient achieving the goals of the procedure, and any potential problems that might occur during the procedure or recuperation. Informed consent has been obtained.  Description of procedure:  The patient was taken to the operating room and general anesthesia was induced.  The patient was placed in the dorsal lithotomy position, prepped and draped in the usual sterile fashion, and preoperative antibiotics were  administered. A preoperative time-out was performed.   A 21 French 30 degrees cystoscope was gently passed to the patient's urethra and into the bladder under visual guidance.  The bladder was then emptied and then slowly filled and a 360 degrees cystoscopic evaluation was performed the above findings.  I then used a 5 [place] open-ended catheter and cannulated the patient's left ureteral orifice and performed retrograde pyelogram with the above findings.  I then performed a retrograde pyelogram on the patient's right with the above findings.  I then advanced a 0.38 sensor wire through the open-ended catheter and into the right renal pelvis.  I then remove the open-ended catheter for exchange of a 6 [place] x24 cm double-J ureteral stent.  This was advanced over the wire using the Seldinger technique under fluoroscopic guidance into the right upper pole.  Once the stent was appropriately positioned in the right upper pole I advanced the stent to the bladder neck prior to removing the wire entirely.  A nice curl was noted in the patient's right bladder.  I then placed a B&O suppository into the patient's rectum and performed an exam under anesthesia.  This demonstrated a very hard and mobile and nodular lesion on the anterior rectal wall.  The patient was subsequently extubated return the PACU in stable condition.  Ardis Hughs, M.D.

## 2018-10-24 NOTE — Anesthesia Procedure Notes (Signed)
Procedure Name: LMA Insertion Date/Time: 10/24/2018 11:16 AM Performed by: Niel Hummer, CRNA Pre-anesthesia Checklist: Patient being monitored, Suction available, Emergency Drugs available and Patient identified Patient Re-evaluated:Patient Re-evaluated prior to induction Oxygen Delivery Method: Circle system utilized Preoxygenation: Pre-oxygenation with 100% oxygen Induction Type: IV induction LMA: LMA inserted LMA Size: 4.0 Dental Injury: Teeth and Oropharynx as per pre-operative assessment

## 2018-10-25 ENCOUNTER — Inpatient Hospital Stay: Payer: BLUE CROSS/BLUE SHIELD

## 2018-10-25 ENCOUNTER — Ambulatory Visit
Admission: RE | Admit: 2018-10-25 | Discharge: 2018-10-25 | Disposition: A | Discharge status: Home / Self Care | Payer: BLUE CROSS/BLUE SHIELD | Source: Ambulatory Visit | Attending: Radiation Oncology | Admitting: Radiation Oncology

## 2018-10-25 ENCOUNTER — Inpatient Hospital Stay (HOSPITAL_BASED_OUTPATIENT_CLINIC_OR_DEPARTMENT_OTHER): Payer: BLUE CROSS/BLUE SHIELD | Admitting: Hematology and Oncology

## 2018-10-25 ENCOUNTER — Other Ambulatory Visit: Payer: Self-pay

## 2018-10-25 ENCOUNTER — Encounter: Payer: Self-pay | Admitting: *Deleted

## 2018-10-25 ENCOUNTER — Encounter: Payer: Self-pay | Admitting: Hematology and Oncology

## 2018-10-25 ENCOUNTER — Telehealth: Payer: Self-pay | Admitting: Hematology and Oncology

## 2018-10-25 ENCOUNTER — Encounter (HOSPITAL_COMMUNITY): Payer: Self-pay | Admitting: Urology

## 2018-10-25 VITALS — BP 112/68 | HR 85 | Temp 98.1°F | Resp 18 | Ht 61.0 in | Wt 183.4 lb

## 2018-10-25 VITALS — BP 113/69 | HR 80 | Temp 99.0°F | Resp 20 | Ht 61.0 in | Wt 183.0 lb

## 2018-10-25 DIAGNOSIS — R591 Generalized enlarged lymph nodes: Secondary | ICD-10-CM | POA: Diagnosis not present

## 2018-10-25 DIAGNOSIS — D61818 Other pancytopenia: Secondary | ICD-10-CM

## 2018-10-25 DIAGNOSIS — M545 Low back pain: Secondary | ICD-10-CM | POA: Diagnosis not present

## 2018-10-25 DIAGNOSIS — D500 Iron deficiency anemia secondary to blood loss (chronic): Secondary | ICD-10-CM

## 2018-10-25 DIAGNOSIS — K449 Diaphragmatic hernia without obstruction or gangrene: Secondary | ICD-10-CM | POA: Insufficient documentation

## 2018-10-25 DIAGNOSIS — C530 Malignant neoplasm of endocervix: Secondary | ICD-10-CM

## 2018-10-25 DIAGNOSIS — Z21 Asymptomatic human immunodeficiency virus [HIV] infection status: Secondary | ICD-10-CM | POA: Diagnosis not present

## 2018-10-25 DIAGNOSIS — Z79899 Other long term (current) drug therapy: Secondary | ICD-10-CM | POA: Diagnosis not present

## 2018-10-25 DIAGNOSIS — Z923 Personal history of irradiation: Secondary | ICD-10-CM | POA: Diagnosis not present

## 2018-10-25 DIAGNOSIS — G893 Neoplasm related pain (acute) (chronic): Secondary | ICD-10-CM | POA: Diagnosis not present

## 2018-10-25 DIAGNOSIS — N1330 Unspecified hydronephrosis: Secondary | ICD-10-CM | POA: Diagnosis not present

## 2018-10-25 LAB — COMPREHENSIVE METABOLIC PANEL
ALT: 8 U/L (ref 0–44)
AST: 15 U/L (ref 15–41)
Albumin: 3 g/dL — ABNORMAL LOW (ref 3.5–5.0)
Alkaline Phosphatase: 92 U/L (ref 38–126)
Anion gap: 12 (ref 5–15)
BUN: 37 mg/dL — ABNORMAL HIGH (ref 6–20)
CHLORIDE: 106 mmol/L (ref 98–111)
CO2: 18 mmol/L — ABNORMAL LOW (ref 22–32)
Calcium: 9.8 mg/dL (ref 8.9–10.3)
Creatinine, Ser: 2.19 mg/dL — ABNORMAL HIGH (ref 0.44–1.00)
GFR calc Af Amer: 29 mL/min — ABNORMAL LOW (ref >60)
GFR calc non Af Amer: 25 mL/min — ABNORMAL LOW (ref >60)
Glucose, Bld: 120 mg/dL — ABNORMAL HIGH (ref 70–99)
POTASSIUM: 4.5 mmol/L (ref 3.5–5.1)
Sodium: 136 mmol/L (ref 135–145)
Total Bilirubin: 0.3 mg/dL (ref 0.3–1.2)
Total Protein: 9.3 g/dL — ABNORMAL HIGH (ref 6.5–8.1)

## 2018-10-25 LAB — SAMPLE TO BLOOD BANK

## 2018-10-25 LAB — CBC WITH DIFFERENTIAL/PLATELET
ABS IMMATURE GRANULOCYTES: 0.19 10*3/uL — AB (ref 0.00–0.07)
Basophils Absolute: 0 10*3/uL (ref 0.0–0.1)
Basophils Relative: 0 %
Eosinophils Absolute: 0 10*3/uL (ref 0.0–0.5)
Eosinophils Relative: 0 %
HCT: 23.6 % — ABNORMAL LOW (ref 36.0–46.0)
Hemoglobin: 7.4 g/dL — ABNORMAL LOW (ref 12.0–15.0)
Immature Granulocytes: 1 %
Lymphocytes Relative: 4 %
Lymphs Abs: 0.7 10*3/uL (ref 0.7–4.0)
MCH: 29.7 pg (ref 26.0–34.0)
MCHC: 31.4 g/dL (ref 30.0–36.0)
MCV: 94.8 fL (ref 80.0–100.0)
Monocytes Absolute: 0.7 10*3/uL (ref 0.1–1.0)
Monocytes Relative: 4 %
NEUTROS ABS: 15 10*3/uL — AB (ref 1.7–7.7)
Neutrophils Relative %: 91 %
Platelets: 799 10*3/uL — ABNORMAL HIGH (ref 150–400)
RBC: 2.49 MIL/uL — AB (ref 3.87–5.11)
RDW: 15.4 % (ref 11.5–15.5)
WBC: 16.6 10*3/uL — ABNORMAL HIGH (ref 4.0–10.5)
nRBC: 0 % (ref 0.0–0.2)

## 2018-10-25 LAB — MAGNESIUM: Magnesium: 2 mg/dL (ref 1.7–2.4)

## 2018-10-25 LAB — PREPARE RBC (CROSSMATCH)

## 2018-10-25 MED ORDER — SODIUM CHLORIDE 0.9% FLUSH
10.0000 mL | Freq: Once | INTRAVENOUS | Status: DC
  Filled 2018-10-25: qty 10

## 2018-10-25 MED ORDER — SODIUM CHLORIDE 0.9% FLUSH
3.0000 mL | INTRAVENOUS | Status: AC | PRN
  Administered 2018-10-25: 10 mL
  Filled 2018-10-25: qty 10

## 2018-10-25 MED ORDER — HEPARIN SOD (PORK) LOCK FLUSH 100 UNIT/ML IV SOLN
250.0000 [IU] | INTRAVENOUS | Status: DC | PRN
  Filled 2018-10-25: qty 5

## 2018-10-25 MED ORDER — HEPARIN SOD (PORK) LOCK FLUSH 100 UNIT/ML IV SOLN
500.0000 [IU] | Freq: Every day | INTRAVENOUS | Status: AC | PRN
  Administered 2018-10-25: 500 [IU]
  Filled 2018-10-25: qty 5

## 2018-10-25 MED ORDER — ACETAMINOPHEN 325 MG PO TABS
ORAL_TABLET | ORAL | Status: AC
  Filled 2018-10-25: qty 2

## 2018-10-25 MED ORDER — SODIUM CHLORIDE 0.9 % IV SOLN
Freq: Once | INTRAVENOUS | Status: DC
  Administered 2018-10-25: 16:00:00 via INTRAVENOUS
  Filled 2018-10-25: qty 250

## 2018-10-25 MED ORDER — ACETAMINOPHEN 325 MG PO TABS
650.0000 mg | ORAL_TABLET | Freq: Once | ORAL | Status: AC
  Administered 2018-10-25: 650 mg via ORAL

## 2018-10-25 MED ORDER — SODIUM CHLORIDE 0.9% IV SOLUTION
250.0000 mL | Freq: Once | INTRAVENOUS | Status: AC
  Administered 2018-10-25: 250 mL via INTRAVENOUS
  Filled 2018-10-25: qty 250

## 2018-10-25 MED ORDER — DIPHENHYDRAMINE HCL 25 MG PO CAPS: 25.0000 mg | ORAL_CAPSULE | Freq: Once | ORAL | Status: DC

## 2018-10-25 NOTE — Assessment & Plan Note (Signed)
Treatment is strictly palliative in nature I recommend we pursue chemotherapy with carboplatin and Taxol, with consideration to add bevacizumab in the future after 3 cycles of treatment She also tested positive for PDL 1.  That could be used in future if she progressed on current treatment We discussed the role of chemotherapy. The intent is of palliative intent. We discussed premedication with dexamethasone before chemotherapy. With her young age, she would not need G-CSF support.  I will reduce the dose of treatment based on kidney function

## 2018-10-25 NOTE — Patient Instructions (Signed)

## 2018-10-25 NOTE — Assessment & Plan Note (Signed)
When I saw her last week, I have increased her prescription morphine sulfate.  She felt that her pain is well controlled right now.  I warned her about risk of constipation

## 2018-10-25 NOTE — Progress Notes (Signed)
[place] OFFICE PROGRESS NOTE  Patient Care Team: Default, Provider, MD as PCP - General  ASSESSMENT & PLAN:  Cervical cancer (Misty Thomas) Treatment is strictly palliative in nature I recommend we pursue chemotherapy with carboplatin and Taxol, with consideration to add bevacizumab in the future after 3 cycles of treatment She also tested positive for PDL 1.  That could be used in future if she progressed on current treatment We discussed the role of chemotherapy. The intent is of palliative intent. We discussed premedication with dexamethasone before chemotherapy. With her young age, she would not need G-CSF support.  I will reduce the dose of treatment based on kidney function   Anemia, blood loss She has multifactorial anemia, due to anemia chronic kidney disease, bleeding and untreated cancer. We discussed some of the risks, benefits, and alternatives of blood transfusions. The patient is symptomatic from anemia and the hemoglobin level is critically low.  Some of the side-effects to be expected including risks of transfusion reactions, chills, infection, syndrome of volume overload and risk of hospitalization from various reasons and the patient is willing to proceed and went ahead to sign consent today. She will receive a unit of blood today  Cancer associated pain When I saw her last week, I have increased her prescription morphine sulfate.  She felt that her pain is well controlled right now.  I warned her about risk of constipation  HIV (human immunodeficiency virus infection) ([place]) She will continue antiretroviral therapy She would be at risk of infection while on chemotherapy   Orders Placed This Encounter  Procedures  . Complete patient signature process for consent form    Standing Status:   Future    Standing Expiration Date:   10/25/2019  . Type and screen    Standing Status:   Future    Standing Expiration Date:   10/26/2019  . Prepare RBC    Standing  Status:   Standing    Number of Occurrences:   1    Order Specific Question:   # of Units    Answer:   1 unit    Order Specific Question:   Transfusion Indications    Answer:   Symptomatic Anemia    Order Specific Question:   Special Requirements    Answer:   Irradiated    Order Specific Question:   If emergent release call blood bank    Answer:   [facility] [phone]    INTERVAL HISTORY: Please see below for problem oriented charting. She is here accompanied by her Sister, Misty Thomas She was just discharged after cystoscopy and stent placement. She denies excessive bleeding Her pain is well controlled since her last time I saw her Denies nausea or constipation  SUMMARY OF ONCOLOGIC HISTORY: Oncology History   PD-L 1 40%     Cervical cancer ([name])   01/04/2017 Initial Diagnosis    She went to urgent care service for evaluation of abnormal vaginal symptoms with discharge and was diagnosed with bacterial vaginosis    01/03/2018 Imaging    1. Complex thick-walled fluid collection/abscess the lower pelvis with possible communication to the lower uterine/vagina. Clinical correlation and further evaluation with pelvic ultrasound recommended. CT with IV and delayed oral and rectal contrast may provide additional information. A smaller collection is also noted in the left posterior hemipelvis the left of the rectum. 2. No bowel obstruction or active inflammation.  Normal appendix. Please note the described 5.0 x 5.5 cm complex collection within the pelvis  appears to be in the region of the cervix and may represent a necrotic mass/neoplasm. The described 3 x 3 cm low attenuating lesion to the left of the rectum may represent an abnormal lymph node.     01/04/2018 Pathology Results    Cervix, biopsy - POORLY DIFFERENTIATED CARCINOMA - SEE COMMENT Microscopic Comment The biopsy material consists of multiple fragments of tissue with infiltrative tumor and granulation tissue. By  immunohistochemistry, the tumor is positive for cytokeratin 5/6, p63, p16 and vimentin (diffusely positive) but negative for ER. 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    01/04/2018 Imaging    Pelvic US 1. Heterogeneous area of irregularity with Doppler flow in the region of the cervix. A cervical mass is not excluded. Clinical correlation is recommended.  2. Rounded complex mass/collection posterior to the lower uterus/vagina corresponding to the larger complex collection seen on the earlier CT. This may represent a necrotic or purulent collection. 3. Grossly unremarkable left ovary. Nonvisualization the right ovary.    01/04/2018 Procedure    She underwent examination under anesthesia Procedure: pt taken to the operating room where gen anesthesia was performed. She was placed in the dorsal lithotomy position and prepped and draped in the usual sterile fashion. A bivalve speculum was placed in the pts vagina and a macerated cervix was noted. There was very little that resembled a normal cervix. The tissue was necrotic and malodorous.  Several biopsies were obtained. There was actually very little bleeding from the biopsy sites although there was bleeding from within the uterine cavity above the location of the cervix.       01/22/2018 PET scan    1. Large hypermetabolic cervical mass with local invasion into the perirectal space. Bilateral pelvic adenopathy noted with small but hypermetabolic pelvic lymph nodes compatible with malignancy. I do not see definite hypermetabolic adenopathy in the upper abdomen, but there are scattered small bilateral hypermetabolic lymph nodes in the neck and axillary regions of uncertain significance. Given the low level activity and the skipped region in the abdomen, it is possible that the neck and chest lymph nodes are simply reactive, as usually I would expect to see more upper abdominal adenopathy were there  involvement in the neck and chest. Surveillance is probably warranted. 2. Bilateral mild diffuse thyroid activity suggesting thyroiditis. 3. Other imaging findings of potential clinical significance: Aortic Atherosclerosis (ICD10-I70.0). Mild cardiomegaly. Bilateral iliac artery atherosclerosis. Suspected uterine fundal fibroid.     01/25/2018 Cancer Staging    Staging form: Cervix Uteri, AJCC 8th Edition - Clinical: FIGO Stage IVA (cT4, cN1, cM0) - Signed by [name], MD on 01/25/2018    02/01/2018 Procedure    Successful 8 French right internal jugular vein power port placement with its tip at the SVC/RA junction.    02/05/2018 - 04/18/2018 Radiation Therapy    Radiation treatment dates:   External therapy: 02/05/2018-03/16/2018, Brachytherapy: 03/20/2018, 03/26/2018, 04/04/2018, 04/12/2018, 04/18/2018  Site/dose:   1. Cervix/pelvis, 1.8 Gy in 25 fractions for a total dose of 45 Gy                      2. Sidewall/ parametrial Boost, 1.8 Gy in 5 fractions for a total dose of 9 Gy                      3. cervix, 5.5 Gy/fx,  5 fractions for a total dose of 27.5 Gy  Beams/energy:  1. 3D, 15X                              2. 3D, 15X                              3. HDR, Ir-192, tandem ring system     02/09/2018 - 03/09/2018 Chemotherapy    The patient had weekly cisplatin    07/12/2018 PET scan    Significant response to therapy with decreased hypermetabolic cervical soft tissue mass, left perirectal/presacral soft tissue density, and bilateral iliac lymphadenopathy. No new or progressive metastatic disease identified.  Increased mild right hydronephrosis noted.  Stable thyroiditis, hepatic steatosis, and small uterine fibroid    10/09/2018 PET scan    1. Degradation secondary to patient body habitus. 2. Disease progression, as evidenced by increase in hypermetabolism within the cervix and a perirectal area of soft tissue density and gas. Progressive left common iliac and new retroperitoneal  abdominal hypermetabolism, suspicious for nodal metastasis. 3. Gas within the cervical region could be related to necrosis from radiation therapy. Fistulous communication to bowel could look similar. 4. Similar to mild increase in moderate right-sided hydronephrosis. 5. Thyroid hypermetabolism, again suggesting thyroiditis.    10/24/2018 Surgery    Procedure: 1. Cystoscopy, bilateral retrograde pyelogram with interpretation 2. Right ureteral stent placement   Surgeon: Ardis Hughs, MD  Intraoperative findings: #1:  Cystoscopy demonstrated some bullous edema within the bladder, but no discrete or obvious mucosal lesions.  The ureters were orthotopic. #2: Left retrograde pyelogram demonstrated some slight narrowing within the distal ureter, but there was no significant hydroureteronephrosis. #3: The right retrograde pyelogram demonstrated a dense 3 cm narrow stricture of the distal ureter and UVJ.  There was also a narrowing in the mid ureter just above the pelvic inlet that was approximately 2-1/2 cm long. #4: A 24 cm x 6 French Bard double-J inlay stent was placed in the patient's right ureter without complication. #5: An exam under anesthesia demonstrated a very hard and mobile and nodular lesion on the anterior rectal wall.     10/25/2018 -  Chemotherapy    The patient had carboplatin and Taxol     REVIEW OF SYSTEMS:   Constitutional: Denies fevers, chills or abnormal weight loss Eyes: Denies blurriness of vision Ears, nose, mouth, throat, and face: Denies mucositis or sore throat Respiratory: Denies cough, dyspnea or wheezes Cardiovascular: Denies palpitation, chest discomfort or lower extremity swelling Gastrointestinal:  Denies nausea, heartburn or change in bowel habits Skin: Denies abnormal skin rashes Lymphatics: Denies new lymphadenopathy or easy bruising Neurological:Denies numbness, tingling or new weaknesses Behavioral/Psych: Mood is stable, no new changes   All other systems were reviewed with the patient and are negative.  I have reviewed the past medical history, past surgical history, social history and family history with the patient and they are unchanged from previous note.  ALLERGIES:  has No Known Allergies.  MEDICATIONS:  Current Facility-Administered Medications  Medication Dose Route Frequency Provider Last Rate Last Dose  . diphenhydrAMINE (BENADRYL) capsule 25 mg  25 mg Oral Once [name], MD       Current Outpatient Medications  Medication Sig Dispense Refill  . phenazopyridine (PYRIDIUM) 200 MG tablet Take 1 tablet (200 mg total) by mouth 3 (three) times daily as needed for pain. 10 tablet 0   Facility-Administered Medications Ordered in Other Visits  Medication Dose Route Frequency Provider Last Rate Last Dose  . acetaminophen (TYLENOL) tablet 650 mg  650 mg Oral Q6H PRN Ellender, Karyl Kinnier, MD   650 mg at 10/24/18 1327  . acetaminophen (TYLENOL) tablet 650 mg  650 mg Oral Once Alvy Bimler, Yanet Balliet, MD      . fentaNYL (SUBLIMAZE) injection 25-50 mcg  25-50 mcg Intravenous Q5 min PRN Ellender, Karyl Kinnier, MD   50 mcg at 10/24/18 1215  . heparin lock flush 100 unit/mL  500 Units Intracatheter Daily PRN Alvy Bimler, Marjani Kobel, MD      . heparin lock flush 100 unit/mL  250 Units Intracatheter PRN Alvy Bimler, Emmani Lesueur, MD      . HYDROmorphone (DILAUDID) injection 0.25-0.5 mg  0.25-0.5 mg Intravenous Q5 min PRN Ellender, Karyl Kinnier, MD   0.5 mg at 10/24/18 1242  . lactated ringers infusion   Intravenous Continuous Catalina Gravel, MD 10 mL/hr at 10/24/18 0845    . ondansetron (ZOFRAN) injection 4 mg  4 mg Intravenous Once PRN Ellender, Karyl Kinnier, MD      . sodium chloride flush (NS) 0.9 % injection 3 mL  3 mL Intracatheter PRN Alvy Bimler, Rocco Kerkhoff, MD        PHYSICAL EXAMINATION: ECOG PERFORMANCE STATUS: 1 - Symptomatic but completely ambulatory  Vitals:   10/25/18 1308  BP: 112/68  Pulse: 85  Resp: 18  Temp: 98.1 F (36.7 C)  SpO2: 100%   Filed Weights    10/25/18 1308  Weight: 183 lb 6.4 oz (83.2 kg)    GENERAL:alert, no distress and comfortable SKIN: skin color, texture, turgor are normal, no rashes or significant lesions EYES: normal, Conjunctiva are pink and non-injected, sclera clear OROPHARYNX:no exudate, no erythema and lips, buccal mucosa, and tongue normal  NECK: supple, thyroid normal size, non-tender, without nodularity LYMPH:  no palpable lymphadenopathy in the cervical, axillary or inguinal LUNGS: clear to auscultation and percussion with normal breathing effort HEART: regular rate & rhythm and no murmurs and no lower extremity edema ABDOMEN:abdomen soft, non-tender and normal bowel sounds Musculoskeletal:no cyanosis of digits and no clubbing  NEURO: alert & oriented x 3 with fluent speech, no focal motor/sensory deficits  LABORATORY DATA:  I have reviewed the data as listed    Component Value Date/Time   NA 136 10/25/2018 1159   K 4.5 10/25/2018 1159   CL 106 10/25/2018 1159   CO2 18 (L) 10/25/2018 1159   GLUCOSE 120 (H) 10/25/2018 1159   BUN 37 (H) 10/25/2018 1159   CREATININE 2.19 (H) 10/25/2018 1159   CREATININE 1.30 (H) 06/19/2018 1155   CALCIUM 9.8 10/25/2018 1159   PROT 9.3 (H) 10/25/2018 1159   ALBUMIN 3.0 (L) 10/25/2018 1159   AST 15 10/25/2018 1159   ALT 8 10/25/2018 1159   ALKPHOS 92 10/25/2018 1159   BILITOT 0.3 10/25/2018 1159   GFRNONAA 25 (L) 10/25/2018 1159   GFRNONAA 47 (L) 06/19/2018 1155   GFRAA 29 (L) 10/25/2018 1159   GFRAA 55 (L) 06/19/2018 1155    No results found for: SPEP, UPEP  Lab Results  Component Value Date   WBC 16.6 (H) 10/25/2018   NEUTROABS 15.0 (H) 10/25/2018   HGB 7.4 (L) 10/25/2018   HCT 23.6 (L) 10/25/2018   MCV 94.8 10/25/2018   PLT 799 (H) 10/25/2018      Chemistry      Component Value Date/Time   NA 136 10/25/2018 1159   K 4.5 10/25/2018 1159   CL 106 10/25/2018 1159   CO2 18 (  L) 10/25/2018 1159   BUN 37 (H) 10/25/2018 1159   CREATININE 2.19 (H)  10/25/2018 1159   CREATININE 1.30 (H) 06/19/2018 1155      Component Value Date/Time   CALCIUM 9.8 10/25/2018 1159   ALKPHOS 92 10/25/2018 1159   AST 15 10/25/2018 1159   ALT 8 10/25/2018 1159   BILITOT 0.3 10/25/2018 1159       RADIOGRAPHIC STUDIES: I have personally reviewed the radiological images as listed and agreed with the findings in the report. Nm Pet Image Restag (ps) Skull Base To Thigh  Result Date: 10/09/2018 CLINICAL DATA:  Subsequent treatment strategy for cervical cancer with periaortic lymph node metastasis. Radiation therapy in June. EXAM: NUCLEAR MEDICINE PET SKULL BASE TO THIGH TECHNIQUE: 10.8 mCi F-18 FDG was injected intravenously. Full-ring PET imaging was performed from the skull base to thigh after the radiotracer. CT data was obtained and used for attenuation correction and anatomic localization. Fasting blood glucose: 81 mg/dl COMPARISON:  07/11/2018 FINDINGS: Mild to moderate degradation secondary to patient body habitus. Mediastinal blood pool activity: SUV max 3.3 NECK: Muscular activity within the neck. Diffuse thyroid hypermetabolism again identified, including at a S.U.V. max of 10.3. No cervical nodal hypermetabolism. Incidental CT findings: No cervical adenopathy. CHEST: No thoracic nodal hypermetabolism. No pulmonary parenchymal hypermetabolism. Incidental CT findings: Right Port-A-Cath tip at high right atrium. Tiny hiatal hernia. ABDOMEN/PELVIS: Foci of retrocaval hypermetabolism may be due to small nodes. Examples including at a S.U.V. max of 9.4 on image 116. Hypermetabolism along the left common iliac chain measures a S.U.V. max of 5.9 on image 134/4 versus a S.U.V. max of 3.1 at the same level on the prior exam (when remeasured). Left perirectal hypermetabolism and soft tissue fullness/gas are difficult to differentiate from the adjacent rectum today. Felt to measure on the order of 3.2 cm and a S.U.V. max of 12.6 on image 160/4. Compare 3.0 cm and a S.U.V.  max of 5.6 on the prior. Hypermetabolism within the lower uterine segment and cervical region. This measures a S.U.V. max of 10.9 today, and demonstrates central gas on image 153/4. Compare a S.U.V. max of 4.7 on the prior. Incidental CT findings: Mild-to-moderate hydronephrosis, similar to minimally increased. Low-density left renal lesion is likely a cyst. Large colonic stool burden. SKELETON: Diffuse marrow hypermetabolism could be related to stimulation by chemotherapy. Incidental CT findings: none IMPRESSION: 1. Degradation secondary to patient body habitus. 2. Disease progression, as evidenced by increase in hypermetabolism within the cervix and a perirectal area of soft tissue density and gas. Progressive left common iliac and new retroperitoneal abdominal hypermetabolism, suspicious for nodal metastasis. 3. Gas within the cervical region could be related to necrosis from radiation therapy. Fistulous communication to bowel could look similar. 4. Similar to mild increase in moderate right-sided hydronephrosis. 5. Thyroid hypermetabolism, again suggesting thyroiditis. Electronically Signed   By: Abigail Miyamoto M.D.   On: 10/09/2018 15:07   Dg C-arm 1-60 Min-no Report  Result Date: 10/24/2018 Fluoroscopy was utilized by the requesting physician.  No radiographic interpretation.    All questions were answered. The patient knows to call the clinic with any problems, questions or concerns. No barriers to learning was detected.  I spent 25 minutes counseling the patient face to face. The total time spent in the appointment was 30 minutes and more than 50% was on counseling and review of test results  [name], MD 10/25/2018 1:38 PM

## 2018-10-25 NOTE — Telephone Encounter (Signed)
Per 12/19 no new orders

## 2018-10-25 NOTE — Progress Notes (Signed)
Misty Thomas presents today for f/u with Dr. Sondra Come. Pt had cystoscopy yesterday. Pt denies dysuria but does report hematuria related to yesterday's urological procedure. Pt reports vaginal discharge, dark brown, with foul odor. Pt wears sanitary pad for discharge. Pt denies rectal bleeding, diarrhea/constipation.   BP 113/69 (BP Location: Right Arm, Patient Position: Sitting)   Pulse 80   Temp 99 F (37.2 C) (Oral)   Resp 20   Ht 5\' 1"  (1.549 m)   Wt 183 lb (83 kg)   LMP 02/03/2018   SpO2 100%   BMI 34.58 kg/m   Wt Readings from Last 3 Encounters:  10/24/18 178 lb (80.7 kg)  10/25/18 183 lb (83 kg)  10/23/18 178 lb (80.7 kg)   Loma Sousa, RN BSN

## 2018-10-25 NOTE — Progress Notes (Signed)
Radiation Oncology         [phone] ________________________________  Name: Misty Thomas MRN: [id number]  Date: 10/25/2018  DOB: 1966-07-24  Follow-Up Visit Note  CC: Default, Provider, MD  Everitt Amber, MD    ICD-10-CM   1. Malignant neoplasm of endocervix [facility]) C53.0     Diagnosis:   Stage IIIB, Squamous Cell Carcinoma of the Cervix  Interval Since Last Radiation:  6 months 02/05/18 - 03/16/18: IMRT to pelvis / 45 Gy in 25 fractions; sidewall boost / 9 Gy in 5 fractions 03/20/18 - 04/18/18: brachytherapy (tandem ring) to cervix / 27.5 Gy in 5 fractions  Narrative:  The patient returns today for routine follow-up. She is accompanied by her sister. She was seen in the hospital yesterday for hydronephrosis and right side pain, but discharged and spent last night at home. She recently began a long-acting narcotic for pain management  Since her last visit to the clinic, she had a PET scan on 07/11/18 that showed significant response to therapy with decreased hypermetabolic cervical soft tissue mass, left perirectal/presacral soft tissue density, and bilateral iliac lymphadenopathy. No new or progressive metastatic disease identified. Increased mild right hydronephrosis was noted. Also noted was stable thyroiditis, hepatic steatosis, and small uterine fibroid.  She had a second PET scan on 12/3 that showed degradation secondary to patient body habitus. There was notable disease progression, as evidenced by increase in hypermetabolism within the cervix and a perirectal area of soft tissue density and gas. The left common iliac was progressive and new retroperitoneal abdominal hypermetabolism was discovered, suspicious for nodal metastasis/ Gas within the cervical region could be related to necrosis from radiation therapy. Fistulous communication to bowel could look similar. Similar to mild increase in moderate right-sided hydronephrosis was noted, as well as thyroid hypermetabolism, again  suggesting thyroiditis.   She reports dull, constant lower back pain, not letting up despite some response to therapy shown in her 9/4 PET scan. She had no other symptoms to report.  ALLERGIES:  has No Known Allergies.  Meds: No current facility-administered medications for this encounter.    Current Outpatient Medications  Medication Sig Dispense Refill  . phenazopyridine (PYRIDIUM) 200 MG tablet Take 1 tablet (200 mg total) by mouth 3 (three) times daily as needed for pain. 10 tablet 0   Facility-Administered Medications Ordered in Other Encounters  Medication Dose Route Frequency Provider Last Rate Last Dose  . acetaminophen (TYLENOL) tablet 650 mg  650 mg Oral Q6H PRN Ellender, Karyl Kinnier, MD   650 mg at 10/24/18 1327  . fentaNYL (SUBLIMAZE) injection 25-50 mcg  25-50 mcg Intravenous Q5 min PRN Ellender, Karyl Kinnier, MD   50 mcg at 10/24/18 1215  . heparin lock flush 100 unit/mL  500 Units Intracatheter Daily PRN Alvy Bimler, Ni, MD      . heparin lock flush 100 unit/mL  250 Units Intracatheter PRN Alvy Bimler, Ni, MD      . HYDROmorphone (DILAUDID) injection 0.25-0.5 mg  0.25-0.5 mg Intravenous Q5 min PRN Ellender, Karyl Kinnier, MD   0.5 mg at 10/24/18 1242  . lactated ringers infusion   Intravenous Continuous Catalina Gravel, MD 10 mL/hr at 10/24/18 0845    . ondansetron (ZOFRAN) injection 4 mg  4 mg Intravenous Once PRN Ellender, Karyl Kinnier, MD      . sodium chloride flush (NS) 0.9 % injection 3 mL  3 mL Intracatheter PRN [name], MD        Physical Findings: The patient is in  no acute distress. Patient is alert and oriented.  height is 5\' 1"  (1.549 m) and weight is 183 lb (83 kg). Her oral temperature is 99 F (37.2 C). Her blood pressure is 113/69 and her pulse is 80. Her respiration is 20 and oxygen saturation is 100%.   Lungs are clear to auscultation bilaterally. Heart has regular rate and rhythm. No palpable cervical, supraclavicular, or axillary adenopathy. Abdomen soft, non-tender, normal  bowel sounds. She shows no weaknesses in her lower extremities. Patient is non-tender upon palpation along the lower back and sacral region.  Pelvic exam deferred in light of recent documentation of disease progression.  Lab Findings: Lab Results  Component Value Date   WBC 16.6 (H) 10/25/2018   HGB 7.4 (L) 10/25/2018   HCT 23.6 (L) 10/25/2018   MCV 94.8 10/25/2018   PLT 799 (H) 10/25/2018    Radiographic Findings: Nm Pet Image Restag (ps) Skull Base To Thigh  Result Date: 10/09/2018 CLINICAL DATA:  Subsequent treatment strategy for cervical cancer with periaortic lymph node metastasis. Radiation therapy in June. EXAM: NUCLEAR MEDICINE PET SKULL BASE TO THIGH TECHNIQUE: 10.8 mCi F-18 FDG was injected intravenously. Full-ring PET imaging was performed from the skull base to thigh after the radiotracer. CT data was obtained and used for attenuation correction and anatomic localization. Fasting blood glucose: 81 mg/dl COMPARISON:  07/11/2018 FINDINGS: Mild to moderate degradation secondary to patient body habitus. Mediastinal blood pool activity: SUV max 3.3 NECK: Muscular activity within the neck. Diffuse thyroid hypermetabolism again identified, including at a S.U.V. max of 10.3. No cervical nodal hypermetabolism. Incidental CT findings: No cervical adenopathy. CHEST: No thoracic nodal hypermetabolism. No pulmonary parenchymal hypermetabolism. Incidental CT findings: Right Port-A-Cath tip at high right atrium. Tiny hiatal hernia. ABDOMEN/PELVIS: Foci of retrocaval hypermetabolism may be due to small nodes. Examples including at a S.U.V. max of 9.4 on image 116. Hypermetabolism along the left common iliac chain measures a S.U.V. max of 5.9 on image 134/4 versus a S.U.V. max of 3.1 at the same level on the prior exam (when remeasured). Left perirectal hypermetabolism and soft tissue fullness/gas are difficult to differentiate from the adjacent rectum today. Felt to measure on the order of 3.2 cm and a  S.U.V. max of 12.6 on image 160/4. Compare 3.0 cm and a S.U.V. max of 5.6 on the prior. Hypermetabolism within the lower uterine segment and cervical region. This measures a S.U.V. max of 10.9 today, and demonstrates central gas on image 153/4. Compare a S.U.V. max of 4.7 on the prior. Incidental CT findings: Mild-to-moderate hydronephrosis, similar to minimally increased. Low-density left renal lesion is likely a cyst. Large colonic stool burden. SKELETON: Diffuse marrow hypermetabolism could be related to stimulation by chemotherapy. Incidental CT findings: none IMPRESSION: 1. Degradation secondary to patient body habitus. 2. Disease progression, as evidenced by increase in hypermetabolism within the cervix and a perirectal area of soft tissue density and gas. Progressive left common iliac and new retroperitoneal abdominal hypermetabolism, suspicious for nodal metastasis. 3. Gas within the cervical region could be related to necrosis from radiation therapy. Fistulous communication to bowel could look similar. 4. Similar to mild increase in moderate right-sided hydronephrosis. 5. Thyroid hypermetabolism, again suggesting thyroiditis. Electronically Signed   By: Abigail Miyamoto M.D.   On: 10/09/2018 15:07   Dg C-arm 1-60 Min-no Report  Result Date: 10/24/2018 Fluoroscopy was utilized by the requesting physician.  No radiographic interpretation.    Impression:  Stage IIIB, Squamous Cell Carcinoma of the Cervix Recurrent cervical  cancer now. The patient's initial PET showed good response to treatment however, repeat scan shows disease progression. She recently underwent right ureteral stent placement for hydronephrosis. She will proceed with chemotherapy tomorrow; Carboplatin and Taxol.  Plan: PRN follow-up in radiation oncology, and continue close follow-up in medical oncology for chemotherapy.  __________________________________ -----------------------------------  Blair Promise, PhD, MD  This  document serves as a record of services personally performed by Gery Pray, MD. It was created on his behalf by Mary-Margaret Loma Messing, a trained medical scribe. The creation of this record is based on the scribe's personal observations and the provider's statements to them. This document has been checked and approved by the attending provider.

## 2018-10-25 NOTE — Patient Instructions (Signed)
Blood Transfusion, Adult, Care After This sheet gives you information about how to care for yourself after your procedure. Your doctor may also give you more specific instructions. If you have problems or questions, contact your doctor. Follow these instructions at home:   Take over-the-counter and prescription medicines only as told by your doctor.  Go back to your normal activities as told by your doctor.  Follow instructions from your doctor about how to take care of the area where an IV tube was put into your vein (insertion site). Make sure you: ? Wash your hands with soap and water before you change your bandage (dressing). If there is no soap and water, use hand sanitizer. ? Change your bandage as told by your doctor.  Check your IV insertion site every day for signs of infection. Check for: ? More redness, swelling, or pain. ? More fluid or blood. ? Warmth. ? Pus or a bad smell. Contact a doctor if:  You have more redness, swelling, or pain around the IV insertion site.  You have more fluid or blood coming from the IV insertion site.  Your IV insertion site feels warm to the touch.  You have pus or a bad smell coming from the IV insertion site.  Your pee (urine) turns pink, red, or brown.  You feel weak after doing your normal activities. Get help right away if:  You have signs of a serious allergic or body defense (immune) system reaction, including: ? Itchiness. ? Hives. ? Trouble breathing. ? Anxiety. ? Pain in your chest or lower back. ? Fever, flushing, and chills. ? Fast pulse. ? Rash. ? Watery poop (diarrhea). ? Throwing up (vomiting). ? Dark pee. ? Serious headache. ? Dizziness. ? Stiff neck. ? Yellow color in your face or the white parts of your eyes (jaundice). Summary  After a blood transfusion, return to your normal activities as told by your doctor.  Every day, check for signs of infection where the IV tube was put into your vein.  Some  signs of infection are warm skin, more redness and pain, more fluid or blood, and pus or a bad smell where the needle went in.  Contact your doctor if you feel weak or have any unusual symptoms. This information is not intended to replace advice given to you by your health care provider. Make sure you discuss any questions you have with your health care provider. Document Released: 11/14/2014 Document Revised: 06/17/2016 Document Reviewed: 06/17/2016 Elsevier Interactive Patient Education  2019 [employer]  

## 2018-10-25 NOTE — Assessment & Plan Note (Signed)
She will continue antiretroviral therapy She would be at risk of infection while on chemotherapy

## 2018-10-25 NOTE — Progress Notes (Signed)
[place] Work  Clinical Social Work met with patient in infusion room to discuss disability application.  Patient and CSW contacted Disability Determination Services regarding her existing claim.  CSW will fax patient paperwork provided by patient to [employer].  CSW explained process to patient and encouraged her to call with any questions or concerns.  Gwinda Maine, LCSW  Clinical Social Worker [facility]

## 2018-10-25 NOTE — Assessment & Plan Note (Signed)
She has multifactorial anemia, due to anemia chronic kidney disease, bleeding and untreated cancer. We discussed some of the risks, benefits, and alternatives of blood transfusions. The patient is symptomatic from anemia and the hemoglobin level is critically low.  Some of the side-effects to be expected including risks of transfusion reactions, chills, infection, syndrome of volume overload and risk of hospitalization from various reasons and the patient is willing to proceed and went ahead to sign consent today. She will receive a unit of blood today

## 2018-10-26 ENCOUNTER — Inpatient Hospital Stay (HOSPITAL_COMMUNITY): Payer: Medicaid Other

## 2018-10-26 ENCOUNTER — Emergency Department (HOSPITAL_COMMUNITY): Payer: Medicaid Other

## 2018-10-26 ENCOUNTER — Inpatient Hospital Stay (HOSPITAL_COMMUNITY)
Admission: EM | Admit: 2018-10-26 | Discharge: 2018-11-09 | DRG: 394 | Disposition: A | Discharge status: Home Health | Payer: Medicaid Other | Attending: Family Medicine | Admitting: Family Medicine

## 2018-10-26 ENCOUNTER — Other Ambulatory Visit: Payer: Self-pay

## 2018-10-26 ENCOUNTER — Inpatient Hospital Stay: Payer: BLUE CROSS/BLUE SHIELD

## 2018-10-26 ENCOUNTER — Ambulatory Visit (HOSPITAL_BASED_OUTPATIENT_CLINIC_OR_DEPARTMENT_OTHER): Payer: BLUE CROSS/BLUE SHIELD | Admitting: Medical

## 2018-10-26 VITALS — BP 142/78 | HR 53 | Temp 97.8°F | Resp 18

## 2018-10-26 DIAGNOSIS — D61818 Other pancytopenia: Secondary | ICD-10-CM

## 2018-10-26 DIAGNOSIS — D473 Essential (hemorrhagic) thrombocythemia: Secondary | ICD-10-CM

## 2018-10-26 DIAGNOSIS — R112 Nausea with vomiting, unspecified: Secondary | ICD-10-CM | POA: Diagnosis present

## 2018-10-26 DIAGNOSIS — N823 Fistula of vagina to large intestine: Secondary | ICD-10-CM | POA: Diagnosis present

## 2018-10-26 DIAGNOSIS — R590 Localized enlarged lymph nodes: Secondary | ICD-10-CM | POA: Diagnosis present

## 2018-10-26 DIAGNOSIS — C530 Malignant neoplasm of endocervix: Secondary | ICD-10-CM

## 2018-10-26 DIAGNOSIS — Z923 Personal history of irradiation: Secondary | ICD-10-CM

## 2018-10-26 DIAGNOSIS — E1122 Type 2 diabetes mellitus with diabetic chronic kidney disease: Secondary | ICD-10-CM | POA: Diagnosis present

## 2018-10-26 DIAGNOSIS — R1084 Generalized abdominal pain: Secondary | ICD-10-CM | POA: Diagnosis not present

## 2018-10-26 DIAGNOSIS — N183 Chronic kidney disease, stage 3 (moderate): Secondary | ICD-10-CM | POA: Diagnosis present

## 2018-10-26 DIAGNOSIS — N136 Pyonephrosis: Secondary | ICD-10-CM | POA: Diagnosis present

## 2018-10-26 DIAGNOSIS — K921 Melena: Secondary | ICD-10-CM | POA: Diagnosis present

## 2018-10-26 DIAGNOSIS — Z8541 Personal history of malignant neoplasm of cervix uteri: Secondary | ICD-10-CM | POA: Diagnosis not present

## 2018-10-26 DIAGNOSIS — D631 Anemia in chronic kidney disease: Secondary | ICD-10-CM | POA: Diagnosis present

## 2018-10-26 DIAGNOSIS — R319 Hematuria, unspecified: Secondary | ICD-10-CM | POA: Diagnosis present

## 2018-10-26 DIAGNOSIS — K5900 Constipation, unspecified: Secondary | ICD-10-CM | POA: Diagnosis present

## 2018-10-26 DIAGNOSIS — R1030 Lower abdominal pain, unspecified: Secondary | ICD-10-CM

## 2018-10-26 DIAGNOSIS — R531 Weakness: Secondary | ICD-10-CM

## 2018-10-26 DIAGNOSIS — K611 Rectal abscess: Principal | ICD-10-CM | POA: Diagnosis present

## 2018-10-26 DIAGNOSIS — E440 Moderate protein-calorie malnutrition: Secondary | ICD-10-CM | POA: Diagnosis present

## 2018-10-26 DIAGNOSIS — M545 Low back pain: Secondary | ICD-10-CM | POA: Diagnosis not present

## 2018-10-26 DIAGNOSIS — M549 Dorsalgia, unspecified: Secondary | ICD-10-CM | POA: Diagnosis present

## 2018-10-26 DIAGNOSIS — Z66 Do not resuscitate: Secondary | ICD-10-CM | POA: Diagnosis not present

## 2018-10-26 DIAGNOSIS — E872 Acidosis: Secondary | ICD-10-CM | POA: Diagnosis present

## 2018-10-26 DIAGNOSIS — E119 Type 2 diabetes mellitus without complications: Secondary | ICD-10-CM

## 2018-10-26 DIAGNOSIS — E669 Obesity, unspecified: Secondary | ICD-10-CM | POA: Diagnosis present

## 2018-10-26 DIAGNOSIS — G893 Neoplasm related pain (acute) (chronic): Secondary | ICD-10-CM | POA: Diagnosis present

## 2018-10-26 DIAGNOSIS — D62 Acute posthemorrhagic anemia: Secondary | ICD-10-CM | POA: Diagnosis present

## 2018-10-26 DIAGNOSIS — N132 Hydronephrosis with renal and ureteral calculous obstruction: Secondary | ICD-10-CM | POA: Diagnosis not present

## 2018-10-26 DIAGNOSIS — Z79891 Long term (current) use of opiate analgesic: Secondary | ICD-10-CM

## 2018-10-26 DIAGNOSIS — Z8614 Personal history of Methicillin resistant Staphylococcus aureus infection: Secondary | ICD-10-CM | POA: Diagnosis not present

## 2018-10-26 DIAGNOSIS — Z79899 Other long term (current) drug therapy: Secondary | ICD-10-CM

## 2018-10-26 DIAGNOSIS — Z7189 Other specified counseling: Secondary | ICD-10-CM

## 2018-10-26 DIAGNOSIS — T451X5A Adverse effect of antineoplastic and immunosuppressive drugs, initial encounter: Secondary | ICD-10-CM | POA: Diagnosis present

## 2018-10-26 DIAGNOSIS — C539 Malignant neoplasm of cervix uteri, unspecified: Secondary | ICD-10-CM | POA: Diagnosis not present

## 2018-10-26 DIAGNOSIS — E038 Other specified hypothyroidism: Secondary | ICD-10-CM | POA: Diagnosis present

## 2018-10-26 DIAGNOSIS — N179 Acute kidney failure, unspecified: Secondary | ICD-10-CM | POA: Diagnosis not present

## 2018-10-26 DIAGNOSIS — B20 Human immunodeficiency virus [HIV] disease: Secondary | ICD-10-CM | POA: Diagnosis present

## 2018-10-26 DIAGNOSIS — Z515 Encounter for palliative care: Secondary | ICD-10-CM | POA: Diagnosis not present

## 2018-10-26 DIAGNOSIS — N1330 Unspecified hydronephrosis: Secondary | ICD-10-CM

## 2018-10-26 DIAGNOSIS — N739 Female pelvic inflammatory disease, unspecified: Secondary | ICD-10-CM | POA: Diagnosis present

## 2018-10-26 DIAGNOSIS — G9340 Encephalopathy, unspecified: Secondary | ICD-10-CM | POA: Diagnosis present

## 2018-10-26 DIAGNOSIS — E039 Hypothyroidism, unspecified: Secondary | ICD-10-CM | POA: Diagnosis present

## 2018-10-26 DIAGNOSIS — Z21 Asymptomatic human immunodeficiency virus [HIV] infection status: Secondary | ICD-10-CM | POA: Diagnosis not present

## 2018-10-26 DIAGNOSIS — D630 Anemia in neoplastic disease: Secondary | ICD-10-CM | POA: Diagnosis present

## 2018-10-26 DIAGNOSIS — Z95828 Presence of other vascular implants and grafts: Secondary | ICD-10-CM

## 2018-10-26 DIAGNOSIS — R140 Abdominal distension (gaseous): Secondary | ICD-10-CM

## 2018-10-26 DIAGNOSIS — Z79890 Hormone replacement therapy (postmenopausal): Secondary | ICD-10-CM

## 2018-10-26 DIAGNOSIS — K5909 Other constipation: Secondary | ICD-10-CM

## 2018-10-26 DIAGNOSIS — D500 Iron deficiency anemia secondary to blood loss (chronic): Secondary | ICD-10-CM | POA: Diagnosis not present

## 2018-10-26 DIAGNOSIS — D729 Disorder of white blood cells, unspecified: Secondary | ICD-10-CM

## 2018-10-26 DIAGNOSIS — Z6835 Body mass index (BMI) 35.0-35.9, adult: Secondary | ICD-10-CM

## 2018-10-26 DIAGNOSIS — K6289 Other specified diseases of anus and rectum: Secondary | ICD-10-CM | POA: Diagnosis present

## 2018-10-26 DIAGNOSIS — R700 Elevated erythrocyte sedimentation rate: Secondary | ICD-10-CM | POA: Diagnosis present

## 2018-10-26 DIAGNOSIS — D75839 Thrombocytosis, unspecified: Secondary | ICD-10-CM

## 2018-10-26 DIAGNOSIS — N131 Hydronephrosis with ureteral stricture, not elsewhere classified: Secondary | ICD-10-CM | POA: Diagnosis not present

## 2018-10-26 DIAGNOSIS — D649 Anemia, unspecified: Secondary | ICD-10-CM | POA: Diagnosis not present

## 2018-10-26 LAB — COMPREHENSIVE METABOLIC PANEL
ALBUMIN: 3 g/dL — AB (ref 3.5–5.0)
ALT: 9 U/L (ref 0–44)
AST: 16 U/L (ref 15–41)
Alkaline Phosphatase: 77 U/L (ref 38–126)
Anion gap: 10 (ref 5–15)
BILIRUBIN TOTAL: 0.6 mg/dL (ref 0.3–1.2)
BUN: 36 mg/dL — ABNORMAL HIGH (ref 6–20)
CO2: 16 mmol/L — ABNORMAL LOW (ref 22–32)
Calcium: 8.4 mg/dL — ABNORMAL LOW (ref 8.9–10.3)
Chloride: 108 mmol/L (ref 98–111)
Creatinine, Ser: 1.66 mg/dL — ABNORMAL HIGH (ref 0.44–1.00)
GFR calc Af Amer: 41 mL/min — ABNORMAL LOW (ref >60)
GFR calc non Af Amer: 35 mL/min — ABNORMAL LOW (ref >60)
Glucose, Bld: 179 mg/dL — ABNORMAL HIGH (ref 70–99)
Potassium: 4.4 mmol/L (ref 3.5–5.1)
Sodium: 134 mmol/L — ABNORMAL LOW (ref 135–145)
Total Protein: 8.3 g/dL — ABNORMAL HIGH (ref 6.5–8.1)

## 2018-10-26 LAB — TYPE AND SCREEN
ABO/RH(D): O NEG
Antibody Screen: NEGATIVE
Unit division: 0

## 2018-10-26 LAB — CBC WITH DIFFERENTIAL/PLATELET
Abs Immature Granulocytes: 0.3 10*3/uL — ABNORMAL HIGH (ref 0.00–0.07)
Basophils Absolute: 0 10*3/uL (ref 0.0–0.1)
Basophils Relative: 0 %
Eosinophils Absolute: 0 10*3/uL (ref 0.0–0.5)
Eosinophils Relative: 0 %
HEMATOCRIT: 25 % — AB (ref 36.0–46.0)
Hemoglobin: 7.6 g/dL — ABNORMAL LOW (ref 12.0–15.0)
Immature Granulocytes: 2 %
Lymphocytes Relative: 3 %
Lymphs Abs: 0.4 10*3/uL — ABNORMAL LOW (ref 0.7–4.0)
MCH: 29.3 pg (ref 26.0–34.0)
MCHC: 30.4 g/dL (ref 30.0–36.0)
MCV: 96.5 fL (ref 80.0–100.0)
Monocytes Absolute: 0.1 10*3/uL (ref 0.1–1.0)
Monocytes Relative: 1 %
NRBC: 0 % (ref 0.0–0.2)
Neutro Abs: 12.9 10*3/uL — ABNORMAL HIGH (ref 1.7–7.7)
Neutrophils Relative %: 94 %
Platelets: 683 10*3/uL — ABNORMAL HIGH (ref 150–400)
RBC: 2.59 MIL/uL — ABNORMAL LOW (ref 3.87–5.11)
RDW: 17 % — ABNORMAL HIGH (ref 11.5–15.5)
WBC: 13.7 10*3/uL — ABNORMAL HIGH (ref 4.0–10.5)

## 2018-10-26 LAB — URINALYSIS, ROUTINE W REFLEX MICROSCOPIC
Bilirubin Urine: NEGATIVE
Glucose, UA: NEGATIVE mg/dL
Ketones, ur: NEGATIVE mg/dL
Nitrite: NEGATIVE
Protein, ur: 30 mg/dL — AB
RBC / HPF: >50 RBC/hpf — ABNORMAL HIGH (ref 0–5)
SPECIFIC GRAVITY, URINE: 1.019 (ref 1.005–1.030)
WBC, UA: >50 WBC/hpf — ABNORMAL HIGH (ref 0–5)
pH: 6 (ref 5.0–8.0)

## 2018-10-26 LAB — LIPASE, BLOOD: Lipase: 33 U/L (ref 11–51)

## 2018-10-26 LAB — BPAM RBC
BLOOD PRODUCT EXPIRATION DATE: 202001122359
ISSUE DATE / TIME: 201912191534
Unit Type and Rh: 9500

## 2018-10-26 MED ORDER — LIDOCAINE-PRILOCAINE 2.5-2.5 % EX CREA: TOPICAL_CREAM | CUTANEOUS | 3 refills | Status: DC

## 2018-10-26 MED ORDER — SODIUM CHLORIDE 0.9% IV SOLUTION
Freq: Once | INTRAVENOUS | Status: AC
  Administered 2018-10-27: 01:00:00 via INTRAVENOUS

## 2018-10-26 MED ORDER — PALONOSETRON HCL INJECTION 0.25 MG/5ML
INTRAVENOUS | Status: AC
  Filled 2018-10-26: qty 5

## 2018-10-26 MED ORDER — ONDANSETRON HCL 8 MG PO TABS: 8.0000 mg | ORAL_TABLET | Freq: Two times a day (BID) | ORAL | 1 refills | Status: DC | PRN

## 2018-10-26 MED ORDER — SODIUM CHLORIDE (PF) 0.9 % IJ SOLN
INTRAMUSCULAR | Status: AC
  Filled 2018-10-26: qty 50

## 2018-10-26 MED ORDER — HEPARIN SOD (PORK) LOCK FLUSH 100 UNIT/ML IV SOLN
500.0000 [IU] | Freq: Once | INTRAVENOUS | Status: AC | PRN
  Administered 2018-10-26: 500 [IU]
  Filled 2018-10-26: qty 5

## 2018-10-26 MED ORDER — MAGNESIUM OXIDE 400 (241.3 MG) MG PO TABS
400.0000 mg | ORAL_TABLET | Freq: Two times a day (BID) | ORAL | Status: DC
  Administered 2018-10-27 – 2018-11-09 (×28): 400 mg via ORAL
  Filled 2018-10-26 (×28): qty 1

## 2018-10-26 MED ORDER — PHENAZOPYRIDINE HCL 200 MG PO TABS
200.0000 mg | ORAL_TABLET | Freq: Three times a day (TID) | ORAL | Status: DC | PRN
  Administered 2018-10-29: 200 mg via ORAL
  Filled 2018-10-26 (×3): qty 1

## 2018-10-26 MED ORDER — ONDANSETRON HCL 4 MG PO TABS: 4.0000 mg | ORAL_TABLET | Freq: Four times a day (QID) | ORAL | Status: DC | PRN

## 2018-10-26 MED ORDER — FAMOTIDINE IN NACL 20-0.9 MG/50ML-% IV SOLN
INTRAVENOUS | Status: AC
  Filled 2018-10-26: qty 50

## 2018-10-26 MED ORDER — FAMOTIDINE IN NACL 20-0.9 MG/50ML-% IV SOLN
20.0000 mg | Freq: Once | INTRAVENOUS | Status: AC
  Administered 2018-10-26: 20 mg via INTRAVENOUS

## 2018-10-26 MED ORDER — SODIUM CHLORIDE 0.9% IV SOLUTION: Freq: Once | INTRAVENOUS | Status: AC

## 2018-10-26 MED ORDER — MORPHINE SULFATE (PF) 4 MG/ML IV SOLN
4.0000 mg | Freq: Once | INTRAVENOUS | Status: AC
  Administered 2018-10-26: 4 mg via INTRAVENOUS
  Filled 2018-10-26: qty 1

## 2018-10-26 MED ORDER — ONDANSETRON HCL 4 MG/2ML IJ SOLN
4.0000 mg | Freq: Once | INTRAMUSCULAR | Status: AC
  Administered 2018-10-26: 4 mg via INTRAVENOUS
  Filled 2018-10-26: qty 2

## 2018-10-26 MED ORDER — ACETAMINOPHEN 325 MG PO TABS
650.0000 mg | ORAL_TABLET | Freq: Four times a day (QID) | ORAL | Status: DC | PRN
  Administered 2018-10-27 – 2018-11-08 (×6): 650 mg via ORAL
  Filled 2018-10-26 (×9): qty 2

## 2018-10-26 MED ORDER — DIPHENHYDRAMINE HCL 50 MG/ML IJ SOLN
INTRAMUSCULAR | Status: AC
  Filled 2018-10-26: qty 1

## 2018-10-26 MED ORDER — SODIUM CHLORIDE 0.9 % IV SOLN
INTRAVENOUS | Status: DC
  Administered 2018-10-27 (×2): via INTRAVENOUS

## 2018-10-26 MED ORDER — LEVOTHYROXINE SODIUM 25 MCG PO TABS
25.0000 ug | ORAL_TABLET | Freq: Every day | ORAL | Status: DC
  Administered 2018-10-27 – 2018-11-09 (×14): 25 ug via ORAL
  Filled 2018-10-26 (×15): qty 1

## 2018-10-26 MED ORDER — BICTEGRAVIR-EMTRICITAB-TENOFOV 50-200-25 MG PO TABS
1.0000 | ORAL_TABLET | Freq: Every day | ORAL | Status: DC
  Administered 2018-10-27 – 2018-10-30 (×4): 1 via ORAL
  Filled 2018-10-26 (×5): qty 1

## 2018-10-26 MED ORDER — ONDANSETRON HCL 4 MG/2ML IJ SOLN
4.0000 mg | Freq: Four times a day (QID) | INTRAMUSCULAR | Status: DC | PRN
  Administered 2018-11-01 – 2018-11-06 (×2): 4 mg via INTRAVENOUS
  Filled 2018-10-26 (×2): qty 2

## 2018-10-26 MED ORDER — METRONIDAZOLE IN NACL 5-0.79 MG/ML-% IV SOLN
500.0000 mg | Freq: Three times a day (TID) | INTRAVENOUS | Status: DC
  Administered 2018-10-27 – 2018-11-06 (×33): 500 mg via INTRAVENOUS
  Filled 2018-10-26 (×35): qty 100

## 2018-10-26 MED ORDER — ACETAMINOPHEN 650 MG RE SUPP: 650.0000 mg | Freq: Four times a day (QID) | RECTAL | Status: DC | PRN

## 2018-10-26 MED ORDER — PALONOSETRON HCL INJECTION 0.25 MG/5ML
0.2500 mg | Freq: Once | INTRAVENOUS | Status: AC
  Administered 2018-10-26: 0.25 mg via INTRAVENOUS

## 2018-10-26 MED ORDER — HYDROMORPHONE HCL 1 MG/ML IJ SOLN
1.0000 mg | Freq: Once | INTRAMUSCULAR | Status: AC
  Administered 2018-10-26: 1 mg via INTRAVENOUS
  Filled 2018-10-26: qty 1

## 2018-10-26 MED ORDER — SODIUM CHLORIDE 0.9 % IV SOLN
325.5000 mg | Freq: Once | INTRAVENOUS | Status: AC
  Administered 2018-10-26: 330 mg via INTRAVENOUS
  Filled 2018-10-26: qty 33

## 2018-10-26 MED ORDER — SODIUM CHLORIDE 0.9 % IV SOLN
Freq: Once | INTRAVENOUS | Status: AC
  Administered 2018-10-26: 10:00:00 via INTRAVENOUS
  Filled 2018-10-26: qty 5

## 2018-10-26 MED ORDER — SODIUM CHLORIDE 0.9 % IV SOLN
Freq: Once | INTRAVENOUS | Status: AC
  Administered 2018-10-26: 09:00:00 via INTRAVENOUS
  Filled 2018-10-26: qty 250

## 2018-10-26 MED ORDER — IOPAMIDOL (ISOVUE-300) INJECTION 61%
INTRAVENOUS | Status: AC
  Administered 2018-10-26: 80 mL
  Filled 2018-10-26: qty 100

## 2018-10-26 MED ORDER — DIPHENHYDRAMINE HCL 50 MG/ML IJ SOLN
50.0000 mg | Freq: Once | INTRAMUSCULAR | Status: AC
  Administered 2018-10-26: 50 mg via INTRAVENOUS

## 2018-10-26 MED ORDER — SODIUM CHLORIDE 0.9 % IV SOLN
175.0000 mg/m2 | Freq: Once | INTRAVENOUS | Status: AC
  Administered 2018-10-26: 336 mg via INTRAVENOUS
  Filled 2018-10-26: qty 56

## 2018-10-26 MED ORDER — MORPHINE SULFATE 15 MG PO TABS
30.0000 mg | ORAL_TABLET | Freq: Four times a day (QID) | ORAL | Status: DC | PRN
  Administered 2018-10-27 – 2018-11-05 (×15): 30 mg via ORAL
  Filled 2018-10-26 (×17): qty 2

## 2018-10-26 MED ORDER — SODIUM CHLORIDE 0.9 % IV SOLN
2.0000 g | Freq: Once | INTRAVENOUS | Status: AC
  Administered 2018-10-27: 2 g via INTRAVENOUS
  Filled 2018-10-26: qty 2

## 2018-10-26 MED ORDER — MORPHINE SULFATE ER 30 MG PO TBCR
60.0000 mg | EXTENDED_RELEASE_TABLET | Freq: Two times a day (BID) | ORAL | Status: DC
  Administered 2018-10-27 – 2018-11-09 (×28): 60 mg via ORAL
  Filled 2018-10-26 (×28): qty 2

## 2018-10-26 MED ORDER — PROCHLORPERAZINE MALEATE 10 MG PO TABS: 10.0000 mg | ORAL_TABLET | Freq: Four times a day (QID) | ORAL | 1 refills | Status: DC | PRN

## 2018-10-26 MED ORDER — SODIUM CHLORIDE 0.9% FLUSH
10.0000 mL | INTRAVENOUS | Status: DC | PRN
  Administered 2018-10-26: 10 mL
  Filled 2018-10-26: qty 10

## 2018-10-26 MED ORDER — SODIUM CHLORIDE 0.9 % IV BOLUS
1000.0000 mL | Freq: Once | INTRAVENOUS | Status: AC
  Administered 2018-10-26: 1000 mL via INTRAVENOUS

## 2018-10-26 NOTE — ED Notes (Signed)
Patient is getting CT of head before going upstairs.

## 2018-10-26 NOTE — ED Notes (Signed)
Bed: WA20 Expected date:  Expected time:  Means of arrival:  Comments: EMS-cancer center

## 2018-10-26 NOTE — ED Triage Notes (Signed)
Pt coming from cancer center with abdominal pain. Pt reports last BM Tuesday or Wednesday and PA reports decreased Bowel sounds.  Pt had chemo today and completed that.  Pt has a hx of metastatic cervical cancer.

## 2018-10-26 NOTE — Progress Notes (Signed)
[facility] Progress Note   TAMMARA MASSING [id number] 1966-08-24 52 y.o.  TANE BIEGLER is managed by Dr. 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  Generalized abdominal pain: The patient was transported to the emergency room for evaluation and management of her abdominal pain given that she had a cystoscopy completed on 10/24/2018 and had a right ureteral stent placed.  Please see After Visit Summary for patient specific instructions.  Future Appointments  Date Time Provider [place]  11/15/2018 11:30 AM [facility] LAB 2 [facility] None  11/15/2018 11:45 AM [facility] [place] FLUSH [facility] None  11/15/2018 12:15 PM Gorsuch, Ni, MD [facility] None  11/15/2018  1:15 PM [facility] INFUSION [facility] None  11/16/2018  9:00 AM [facility] INFUSION [facility] None  12/18/2018  9:30 AM [facility] LAB [facility]  01/01/2019  9:45 AM Comer, Okey Regal, MD [facility]    No orders of the defined types were placed in this encounter.      Subjective:   Patient ID:  VELISA REGNIER is a 52 y.o. (DOB 1966/07/10) female.  Chief Complaint: No chief complaint on file.   HPI CAMANI SESAY is a 52 year old female who is managed by Dr. [name].  The patient has a history of a metastatic endocervical cancer.  She was treated with cycle 1, day 1 of carboplatin and paclitaxel chemotherapy today (10/26/2018).  She is status post a cystoscopy which was completed on 10/24/2018 with a right ureteral stent placed.  She is currently taking MS Contin 60 mg every 12 hours and morphine sulfate immediate release 30 mg every 6  hours as needed for pain.  Despite taking these today she is having progressive abdominal pain.  She states that she has not had a bowel movement since before her procedure on Wednesday.  She denies fevers, chills, sweats, nausea, or vomiting.  Medications: I have reviewed the patient's current medications.  Allergies: No Known Allergies  Past Medical History:  Diagnosis Date  . Acquired pancytopenia ([place]) 03/2018  . Anemia    Mild  . Cancer associated pain   . 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 Chemotherapy induced nausea and vomiting   . Diabetes mellitus type 2, diet-controlled ([place])    pt. denies No meds  . Frequency of urination   . History of cellulitis 2018   bilateral lower leg w/ mrsa  . History of external beam radiation therapy 02-05-2018  to 03-16-2018   cervical cancer  . History of MRSA infection 2018   w/ bilateral lower leg cellulitis  . HIV (human immunodeficiency virus infection) ([place])    asymptomatic  . Hypomagnesemia 03/2018   Severe---- takes oral magnesium and IV magnesium as needed (cancer center)  . Intermittent diarrhea    due to radiation/ chemo  . Nocturia   . Port-A-Cath in place    Power port  . Radiation burn    LOWER ABD.  04-06-2018  per is healing  . Subclinical  hypothyroidism    w/ thyroiditis , dx 01-22-2018 PET scan  . Thrombocytopenia ([place])   . Wears glasses     Past Surgical History:  Procedure Laterality Date  . CYSTOSCOPY     retrograde pyelogram right ureteral stent placement  . CYSTOSCOPY W/ URETERAL STENT PLACEMENT Bilateral 10/24/2018   Procedure: CYSTOSCOPY WITH BILATERAL RETROGRADE PYELOGRAM RIGHT Wyvonnia Dusky STENT PLACEMENT;  Surgeon: Ardis Hughs, MD;  Location: [facility];   Service: Urology;  Laterality: Bilateral;  . EUA/ CERVICAL BX  01-04-2018   dr Ihor Dow  [facility]  . IR FLUORO GUIDE PORT INSERTION RIGHT  02/01/2018  . TANDEM RING INSERTION N/A 03/20/2018   Procedure: TANDEM RING INSERTION;  Surgeon: Gery Pray, MD;  Location: [facility];  Service: Urology;  Laterality: N/A;  . TANDEM RING INSERTION N/A 03/26/2018   Procedure: TANDEM RING INSERTION;  Surgeon: Gery Pray, MD;  Location: [facility];  Service: Urology;  Laterality: N/A;  . TANDEM RING INSERTION N/A 04/04/2018   Procedure: TANDEM RING INSERTION;  Surgeon: Gery Pray, MD;  Location: [facility];  Service: Urology;  Laterality: N/A;  . TANDEM RING INSERTION N/A 04/12/2018   Procedure: TANDEM RING INSERTION;  Surgeon: Gery Pray, MD;  Location: [facility];  Service: Urology;  Laterality: N/A;  . TANDEM RING INSERTION N/A 04/18/2018   Procedure: TANDEM RING INSERTION;  Surgeon: Gery Pray, MD;  Location: [facility];  Service: Urology;  Laterality: N/A;  . TUBAL LIGATION Bilateral 1990s    Family History  Problem Relation Age of Onset  . Cancer Maternal Grandmother        unknown ca    Social History   Socioeconomic History  . Marital status: Married    Spouse name: [name]  . Number of children: 3  . Years of education: Not on file  . Highest education level: Not on file  Occupational History  . Not on file  Social Needs  . Financial resource strain: Somewhat hard  . Food insecurity:    Worry: Sometimes true    Inability: Sometimes true  . Transportation needs:    Medical: No    Non-medical: No  Tobacco Use  . Smoking status: Never Smoker  . Smokeless tobacco: Never Used  Substance and Sexual Activity  . Alcohol use: Not Currently    Frequency: Never  . Drug use: No  . Sexual activity: Not Currently    Birth control/protection: Surgical  Lifestyle  . Physical activity:    Days per week: 0  days    Minutes per session: 0 min  . Stress: Rather much  Relationships  . Social connections:    Talks on phone: Once a week    Gets together: Once a week    Attends religious service: Never    Active member of club or organization: No    Attends meetings of clubs or organizations: Not on file    Relationship status: Married  . Intimate partner violence:    Fear of current or ex partner: Not on file    Emotionally abused: Not on file    Physically abused: Not on file    Forced sexual activity: Not on file  Other Topics Concern  . Not on file  Social History Narrative  . Not on file    Past Medical History, Surgical history, Social history, and Family history were reviewed and updated as appropriate.   Please see review of systems for further details on  the patient's review from today.   Review of Systems:  Review of Systems  Constitutional: Negative for activity change, appetite change, chills, diaphoresis and fever.  HENT: Negative for trouble swallowing.   Respiratory: Negative for cough, chest tightness and shortness of breath.   Cardiovascular: Negative for chest pain, palpitations and leg swelling.  Gastrointestinal: Positive for abdominal pain. Negative for abdominal distention, constipation, diarrhea, nausea and vomiting.    Objective:   Physical Exam:  LMP 02/03/2018  ECOG: 0  Physical Exam Constitutional:      General: She is not in acute distress.    Appearance: She is not diaphoretic.  HENT:     Head: Normocephalic and atraumatic.     Mouth/Throat:     Pharynx: No oropharyngeal exudate.  Neck:     Musculoskeletal: Normal range of motion and neck supple.  Cardiovascular:     Rate and Rhythm: Normal rate and regular rhythm.     Heart sounds: Normal heart sounds. No murmur. No friction rub. No gallop.   Pulmonary:     Effort: Pulmonary effort is normal. No respiratory distress.     Breath sounds: Normal breath sounds. No wheezing or rales.    Abdominal:     General: Bowel sounds are decreased. There is no distension.     Palpations: Abdomen is soft. There is no mass.     Tenderness: There is abdominal tenderness. There is no guarding or rebound.  Lymphadenopathy:     Cervical: No cervical adenopathy.  Skin:    General: Skin is warm and dry.     Findings: No erythema or rash.  Neurological:     Mental Status: She is alert.     Coordination: Coordination normal.  Psychiatric:        Behavior: Behavior normal.        Thought Content: Thought content normal.        Judgment: Judgment normal.     Lab Review:     Component Value Date/Time   NA 136 10/25/2018 1159   K 4.5 10/25/2018 1159   CL 106 10/25/2018 1159   CO2 18 (L) 10/25/2018 1159   GLUCOSE 120 (H) 10/25/2018 1159   BUN 37 (H) 10/25/2018 1159   CREATININE 2.19 (H) 10/25/2018 1159   CREATININE 1.30 (H) 06/19/2018 1155   CALCIUM 9.8 10/25/2018 1159   PROT 9.3 (H) 10/25/2018 1159   ALBUMIN 3.0 (L) 10/25/2018 1159   AST 15 10/25/2018 1159   ALT 8 10/25/2018 1159   ALKPHOS 92 10/25/2018 1159   BILITOT 0.3 10/25/2018 1159   GFRNONAA 25 (L) 10/25/2018 1159   GFRNONAA 47 (L) 06/19/2018 1155   GFRAA 29 (L) 10/25/2018 1159   GFRAA 55 (L) 06/19/2018 1155       Component Value Date/Time   WBC 16.6 (H) 10/25/2018 1159   RBC 2.49 (L) 10/25/2018 1159   HGB 7.4 (L) 10/25/2018 1159   HCT 23.6 (L) 10/25/2018 1159   PLT 799 (H) 10/25/2018 1159   MCV 94.8 10/25/2018 1159   MCH 29.7 10/25/2018 1159   MCHC 31.4 10/25/2018 1159   RDW 15.4 10/25/2018 1159   LYMPHSABS 0.7 10/25/2018 1159   MONOABS 0.7 10/25/2018 1159   EOSABS 0.0 10/25/2018 1159   BASOSABS 0.0 10/25/2018 1159   -------------------------------  Imaging from last 24 hours (if applicable):  Radiology interpretation: Nm Pet Image Restag (ps) Skull Base To Thigh  Result Date: 10/09/2018 CLINICAL DATA:  Subsequent treatment strategy for cervical cancer with periaortic lymph node metastasis.  Radiation therapy in June. EXAM: NUCLEAR MEDICINE PET SKULL BASE TO THIGH TECHNIQUE: 10.8 mCi F-18 FDG was injected intravenously. Full-ring PET imaging was performed from the skull base to thigh after the radiotracer. CT data was obtained and used for attenuation correction and anatomic localization. Fasting blood glucose: 81 mg/dl COMPARISON:  07/11/2018 FINDINGS: Mild to moderate degradation secondary to patient body habitus. Mediastinal blood pool activity: SUV max 3.3 NECK: Muscular activity within the neck. Diffuse thyroid hypermetabolism again identified, including at a S.U.V. max of 10.3. No cervical nodal hypermetabolism. Incidental CT findings: No cervical adenopathy. CHEST: No thoracic nodal hypermetabolism. No pulmonary parenchymal hypermetabolism. Incidental CT findings: Right Port-A-Cath tip at high right atrium. Tiny hiatal hernia. ABDOMEN/PELVIS: Foci of retrocaval hypermetabolism may be due to small nodes. Examples including at a S.U.V. max of 9.4 on image 116. Hypermetabolism along the left common iliac chain measures a S.U.V. max of 5.9 on image 134/4 versus a S.U.V. max of 3.1 at the same level on the prior exam (when remeasured). Left perirectal hypermetabolism and soft tissue fullness/gas are difficult to differentiate from the adjacent rectum today. Felt to measure on the order of 3.2 cm and a S.U.V. max of 12.6 on image 160/4. Compare 3.0 cm and a S.U.V. max of 5.6 on the prior. Hypermetabolism within the lower uterine segment and cervical region. This measures a S.U.V. max of 10.9 today, and demonstrates central gas on image 153/4. Compare a S.U.V. max of 4.7 on the prior. Incidental CT findings: Mild-to-moderate hydronephrosis, similar to minimally increased. Low-density left renal lesion is likely a cyst. Large colonic stool burden. SKELETON: Diffuse marrow hypermetabolism could be related to stimulation by chemotherapy. Incidental CT findings: none IMPRESSION: 1. Degradation secondary to  patient body habitus. 2. Disease progression, as evidenced by increase in hypermetabolism within the cervix and a perirectal area of soft tissue density and gas. Progressive left common iliac and new retroperitoneal abdominal hypermetabolism, suspicious for nodal metastasis. 3. Gas within the cervical region could be related to necrosis from radiation therapy. Fistulous communication to bowel could look similar. 4. Similar to mild increase in moderate right-sided hydronephrosis. 5. Thyroid hypermetabolism, again suggesting thyroiditis. Electronically Signed   By: Abigail Miyamoto M.D.   On: 10/09/2018 15:07   Dg C-arm 1-60 Min-no Report  Result Date: 10/24/2018 Fluoroscopy was utilized by the requesting physician.  No radiographic interpretation.

## 2018-10-26 NOTE — H&P (Signed)
History and Physical    Misty Thomas RXV:[id number] DOB: 02/26/1966 DOA: 10/26/2018  Referring MD/NP/PA: Reece Agar, PA-C PCP: Patient, No Pcp Per  Patient coming from: Cancer center via EMS  Chief Complaint: Back pain   I have personally briefly reviewed patient's old medical records in [place]   HPI: Misty Thomas is a 52 y.o. female with medical history significant of HIV, cervical cancer on chemotherapy, DM type II, anemia; who presents with complaints of lower back pain.  Patient appears to be somewhat of a poor historian and intermittently talks to herself losing track of conversation.  She notes having back pain for some time even prior to her recent cystostomy with stent placement on the right for hydronephrosis and hydroureter.  She describes the pain as constant and is located right above her buttocks.  Associated symptoms include complains of chills, intermittent shortness of breath, blood in stools, and previously having stool coming out of her vagina.  Patient was sent from her oncologist office today due to her symptoms.  Patient also reports that she is intermittently been talking out of her head and talking to herself.  ED Course: Upon admission to the emergency department patient was noted to be afebrile, blood pressure 115/66-178/85, and all other vital signs stable.  Labs reveal WBC 13.7, globin 7.6, platelets 683, sodium 134, BUN 36, creatinine 1.66, glucose 179.  CT scan of the abdomen pelvis showed new or reaccumulation of the perirectal and presacral abscess previously noted on scans from February of this year.  General surgery was consulted but recommended further talks with oncology for overall plans of care.  Review of records that showed that patient had similar findings previously  Review of Systems  Unable to perform ROS: Mental status change  Respiratory: Positive for shortness of breath.   Gastrointestinal: Positive for abdominal pain and blood in  stool.  Musculoskeletal: Positive for back pain.    Past Medical History:  Diagnosis Date  . Acquired pancytopenia ([place]) 03/2018  . Anemia    Mild  . Cancer associated pain   . Cervical cancer 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 Chemotherapy induced nausea and vomiting   . Diabetes mellitus type 2, diet-controlled ([place])    pt. denies No meds  . Frequency of urination   . History of cellulitis 2018   bilateral lower leg w/ mrsa  . History of external beam radiation therapy 02-05-2018  to 03-16-2018   cervical cancer  . History of MRSA infection 2018   w/ bilateral lower leg cellulitis  . HIV (human immunodeficiency virus infection) ([place])    asymptomatic  . Hypomagnesemia 03/2018   Severe---- takes oral magnesium and IV magnesium as needed (cancer center)  . Intermittent diarrhea    due to radiation/ chemo  . Nocturia   . Port-A-Cath in place    Power port  . Radiation burn    LOWER ABD.  04-06-2018  per is healing  . Subclinical hypothyroidism    w/ thyroiditis , dx 01-22-2018 PET scan  . Thrombocytopenia ([place])   . Wears glasses     Past Surgical History:  Procedure Laterality Date  . CYSTOSCOPY     retrograde pyelogram right ureteral stent placement  . CYSTOSCOPY W/ URETERAL STENT PLACEMENT Bilateral 10/24/2018  Procedure: CYSTOSCOPY WITH BILATERAL RETROGRADE PYELOGRAM RIGHT Wyvonnia Dusky STENT PLACEMENT;  Surgeon: Ardis Hughs, MD;  Location: [facility];  Service: Urology;  Laterality: Bilateral;  . EUA/ CERVICAL BX  01-04-2018   dr Ihor Dow  [facility]  . IR FLUORO GUIDE PORT INSERTION RIGHT  02/01/2018  . TANDEM RING INSERTION N/A 03/20/2018   Procedure: TANDEM RING INSERTION;  Surgeon: Gery Pray, MD;  Location: [facility];  Service: Urology;  Laterality: N/A;  . TANDEM RING INSERTION N/A 03/26/2018   Procedure: TANDEM RING INSERTION;  Surgeon: Gery Pray, MD;  Location: [facility];  Service: Urology;  Laterality: N/A;  . TANDEM RING INSERTION N/A 04/04/2018   Procedure: TANDEM RING INSERTION;  Surgeon: Gery Pray, MD;  Location: [facility];  Service: Urology;  Laterality: N/A;  . TANDEM RING INSERTION N/A 04/12/2018   Procedure: TANDEM RING INSERTION;  Surgeon: Gery Pray, MD;  Location: [facility];  Service: Urology;  Laterality: N/A;  . TANDEM RING INSERTION N/A 04/18/2018   Procedure: TANDEM RING INSERTION;  Surgeon: Gery Pray, MD;  Location: [facility];  Service: Urology;  Laterality: N/A;  . TUBAL LIGATION Bilateral 1990s     reports that she has never smoked. She has never used smokeless tobacco. She reports previous alcohol use. She reports that she does not use drugs.  No Known Allergies  Family History  Problem Relation Age of Onset  . Cancer Maternal Grandmother        unknown ca    Prior to Admission medications   Medication Sig Start Date End Date Taking? Authorizing Provider  acetaminophen (TYLENOL) 500 MG tablet Take 1,000 mg by mouth every 6 (six) hours as needed for moderate pain or headache.    Yes [provider]  BIKTARVY 50-200-25 MG TABS tablet TAKE 1 TABLET BY MOUTH DAILY. 09/14/18  Yes Comer, Okey Regal, MD  dexamethasone (DECADRON) 4 MG tablet Take 5 tabs at the night before and 5 tab the morning of chemotherapy, every 3 weeks, by mouth 10/15/18  Yes Gorsuch, Ni, MD  levothyroxine (SYNTHROID, LEVOTHROID) 25 MCG tablet Take 1 tablet (25 mcg total) by mouth daily before breakfast. 02/08/18  Yes Alvy Bimler, Ni, MD  lidocaine-prilocaine (EMLA) cream Apply to affected area once 10/26/18  Yes Gorsuch, Ni, MD  magnesium oxide (MAG-OX) 400 (241.3 Mg) MG tablet Take 1 tablet (400 mg total) by mouth 2 (two) times  daily. 03/08/18  Yes Gorsuch, Ni, MD  morphine (MS CONTIN) 60 MG 12 hr tablet Take 1 tablet (60 mg total) by mouth every 12 (twelve) hours. 10/15/18  Yes Gorsuch, Ni, MD  morphine (MSIR) 30 MG tablet Take 1 tablet (30 mg total) by mouth every 6 (six) hours as needed for severe pain. 10/15/18  Yes [name], MD  phenazopyridine (PYRIDIUM) 200 MG tablet Take 1 tablet (200 mg total) by mouth 3 (three) times daily as needed for pain. 10/24/18  Yes Ardis Hughs, MD  polyethylene glycol [facility] / Floria Raveling) packet Take 17 g by mouth daily.   Yes [provider]  ondansetron (ZOFRAN) 8 MG tablet Take 1 tablet (8 mg total) by mouth 2 (two) times daily as needed for refractory nausea / vomiting. Start on day 3 after chemo. 10/26/18   [name], MD  prochlorperazine (COMPAZINE) 10 MG tablet Take 1 tablet (10 mg total) by mouth every 6 (six) hours as needed (Nausea or vomiting). 10/26/18   [name], MD    Physical  Exam:  Constitutional: Middle-aged female who appears to be in discomfort currently leaning over the bed. Vitals:   10/26/18 1651  BP: (!) 178/85  Pulse: 60  Resp: 18  Temp: 97.8 F (36.6 C)  TempSrc: Oral  SpO2: 100%   Eyes: PERRL, lids and conjunctivae normal ENMT: Mucous membranes are moist. Posterior pharynx clear of any exudate or lesions.  Neck: normal, supple, no masses, no thyromegaly Respiratory: clear to auscultation bilaterally, no wheezing, no crackles. Normal respiratory effort. No accessory muscle use.  Cardiovascular: Regular rate and rhythm, no murmurs / rubs / gallops. No extremity edema. 2+ pedal pulses. No carotid bruits.  Abdomen: mild abdominal distention present with tenderness to palpation suprapubically.  Positive bowel sounds noted. Musculoskeletal: no clubbing / cyanosis.  Tenderness to palpation of the lower back. Skin: no rashes, lesions, ulcers. No induration Neurologic: CN 2-12 grossly intact. Sensation intact, DTR normal. Strength 5/5 in  all 4.  Psychiatric: Alert, but patient intermittently talking to herself and easily distracted unable to easily refocus.  Memory seems impaired.   Labs on Admission: I have personally reviewed following labs and imaging studies  CBC: Recent Labs  Lab 10/23/18 1019 10/25/18 1159 10/26/18 1702  WBC 15.4* 16.6* 13.7*  NEUTROABS  --  15.0* 12.9*  HGB 8.5* 7.4* 7.6*  HCT 27.7* 23.6* 25.0*  MCV 98.2 94.8 96.5  PLT 765* 799* 709*   Basic Metabolic Panel: Recent Labs  Lab 10/23/18 1019 10/25/18 1159 10/26/18 1702  NA 135 136 134*  K 4.1 4.5 4.4  CL 106 106 108  CO2 17* 18* 16*  GLUCOSE 111* 120* 179*  BUN 33* 37* 36*  CREATININE 2.18* 2.19* 1.66*  CALCIUM 9.7 9.8 8.4*  MG  --  2.0  --    GFR: Estimated Creatinine Clearance: 38.8 mL/min (A) (by C-G formula based on SCr of 1.66 mg/dL (H)). Liver Function Tests: Recent Labs  Lab 10/25/18 1159 10/26/18 1702  AST 15 16  ALT 8 9  ALKPHOS 92 77  BILITOT 0.3 0.6  PROT 9.3* 8.3*  ALBUMIN 3.0* 3.0*   Recent Labs  Lab 10/26/18 1702  LIPASE 33   No results for input(s): AMMONIA in the last 168 hours. Coagulation Profile: No results for input(s): INR, PROTIME in the last 168 hours. Cardiac Enzymes: No results for input(s): CKTOTAL, CKMB, CKMBINDEX, TROPONINI in the last 168 hours. BNP (last 3 results) No results for input(s): PROBNP in the last 8760 hours. HbA1C: No results for input(s): HGBA1C in the last 72 hours. CBG: Recent Labs  Lab 10/24/18 0824 10/24/18 1224  GLUCAP 102* 92   Lipid Profile: No results for input(s): CHOL, HDL, LDLCALC, TRIG, CHOLHDL, LDLDIRECT in the last 72 hours. Thyroid Function Tests: No results for input(s): TSH, T4TOTAL, FREET4, T3FREE, THYROIDAB in the last 72 hours. Anemia Panel: No results for input(s): VITAMINB12, FOLATE, FERRITIN, TIBC, IRON, RETICCTPCT in the last 72 hours. Urine analysis:    Component Value Date/Time   COLORURINE YELLOW 10/26/2018 1702   APPEARANCEUR HAZY  (A) 10/26/2018 1702   LABSPEC 1.019 10/26/2018 1702   PHURINE 6.0 10/26/2018 1702   GLUCOSEU NEGATIVE 10/26/2018 1702   HGBUR LARGE (A) 10/26/2018 1702   BILIRUBINUR NEGATIVE 10/26/2018 1702   KETONESUR NEGATIVE 10/26/2018 1702   PROTEINUR 30 (A) 10/26/2018 1702   UROBILINOGEN 0.2 01/04/2017 1028   NITRITE NEGATIVE 10/26/2018 1702   LEUKOCYTESUR LARGE (A) 10/26/2018 1702   Sepsis Labs: No results found for this or any previous visit (from the past 240  hour(s)).   Radiological Exams on Admission: Ct Abdomen Pelvis W Contrast  Result Date: 10/26/2018 CLINICAL DATA:  Low back pain radiating to the back and left flank. Recent right ureteral stent placement. EXAM: CT ABDOMEN AND PELVIS WITH CONTRAST TECHNIQUE: Multidetector CT imaging of the abdomen and pelvis was performed using the standard protocol following bolus administration of intravenous contrast. CONTRAST:  12mL ISOVUE-300 IOPAMIDOL (ISOVUE-300) INJECTION 61% COMPARISON:  C-arm radiographs dated 10/24/2018. Abdomen and pelvis CT dated 01/03/2018. FINDINGS: Lower chest: Single small right lower lobe bullous. Hepatobiliary: Mild intrahepatic biliary ductal dilatation. Borderline dilated common duct. No obstructing stone or mass visualized. Normal appearing gallbladder. Focal fat deposition in the medial segment of the left lobe of the liver adjacent to the falciform ligament. Pancreas: Unremarkable. No pancreatic ductal dilatation or surrounding inflammatory changes. Spleen: Normal in size without focal abnormality. Adrenals/Urinary Tract: Right ureteral stent in place. The distal portion is just beyond the ureterovesical junction in the posterior aspect of the urinary bladder on the right. The proximal portion is on the upper pole collecting system. Interval moderate dilatation of the right renal collecting system and proximal ureter. No visible ureteral calculi. Large number of bilateral pelvic phleboliths. Previously demonstrated left renal  cyst. Interval mild-to-moderate dilatation of the left renal collecting system without ureteral dilatation. No left ureteral calculi seen. Small amount of air in the urinary bladder. Stomach/Bowel: Stomach is within normal limits. Appendix appears normal. No evidence of bowel wall thickening, distention, or inflammatory changes. Vascular/Lymphatic: No significant vascular findings are present. No enlarged abdominal or pelvic lymph nodes. Reproductive: Uterus and bilateral adnexa are unremarkable. Other: A recurrent or residual fluid and gas collection with thin surrounding enhancing walls cysts demonstrated in the left pararectal region. On image number 64 series 2, this measures 3.9 x 2.5 cm. On sagittal image number 74, this measures 9.4 cm in length and 4.2 cm in AP diameter, including the presacral region. On coronal image number 76, this measures 2.8 cm in width. It is difficult to distinguish the rectum separate from this collection. Musculoskeletal: Lumbar and lower thoracic spine degenerative changes. IMPRESSION: 1. 9.4 x 4.2 x 2.8 cm recurrent or residual fluid and gas collection in the left perirectal and presacral region, as described above. This has features highly suspicious for a perirectal and presacral abscess. 2. Right ureteral stent in place with interval moderate right hydronephrosis and proximal hydroureter. 3. Interval mild-to-moderate left hydronephrosis without ureteral dilatation compatible interval UPJ obstruction. 4. Mild intrahepatic biliary ductal dilatation. This could be due to a nonvisualized common duct stone or stricture. Electronically Signed   By: Claudie Revering M.D.   On: 10/26/2018 19:07      Assessment/Plan Perirectal and peri-sacral abscess and back pain: Acute.  Patient presents with complaints of lower back pain.  CT imaging finds large perirectal and peri-sacral abscess that appears to have previously been noted in February, but unclear what was done for treatment.   Previous PET scan from 12/3 noted the possibility of a fistula.  General surgery was consulted, but recommended to discuss with oncology to determine plan of care - Admit to a MedSurg bed  - Check CRP - Follow-up blood and urine cultures - Empiric antibiotics cefepime and metronidazole IV - Discussed case with oncology/IR in a.m. determine best course of action  Leukocytosis WBC elevated at 13.7, but currently downtrending since procedure.  - Recheck CBC in a.m.  Bilateral hydronephrosis, chronic kidney disease stage III: Patient's creatinine previously had been 1.3 in August, but  trended up to 2.81 on 12/3.  She just underwent stent placement with urology on 12/18 with Dr. Sampson Si. 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  Discussed with Dr. Gilford Rile who recommended outpatient follow-up. - Discussed with urology again in a.m. if needed  - IV fluids 100 mL/h as tolerated  Possible urinary tract infection: Patient's initial urinalysis shows large leukocytes and rare bacteria.  Could signify urinary tract infection in an immunocompromised patient. - Follow-up urine culture - Empiric antibiotics as seen above for abscess  Acute blood loss anemia and/or chemo induced, small rectal bleeding: Hemoglobin 7.6 on admission.  Patient reports still having intermittent blood in stools.  Transfused 1 unit of blood on 12/19. - Transfuse 1 unit of blood  - Check stool guaiac - Continue to monitor and replace as needed  Metastatic endocervical cancer: Patient currently receiving  chemotherapy followed by Dr. Alvy Bimler. - Notify oncology that the patient is admitted into the hospital  Acute encephalopathy: Patient intermittently talking to herself and appears intermittently confused. - Check CT scan of the brain - Neurochecks every 4 hours  HIV: CD4 count 130 on 06/19/2018. - Continue  biktarvy  Diabetes mellitus type 2, without complication.  Patient appears to be diet-controlled last hemoglobin A1c 5.5 on 10/23/2018.   Hypothyroidism - Check TSH - Continue levothyroxine   DVT prophylaxis: SCDs Code Status: Full Family Communication: No family present at bedside Disposition Plan: To be determined Consults called: Surgery Admission status: inpatient   Norval Morton MD [facility] Pager [phone]   If 7PM-7AM, please contact night-coverage [email or web address] Password [facility]  10/26/2018, 9:35 PM

## 2018-10-26 NOTE — ED Notes (Signed)
RN had to de-access and re-access patient's port as it was not compatible with CT scan.

## 2018-10-26 NOTE — ED Notes (Signed)
ED Provider at bedside. 

## 2018-10-26 NOTE — ED Provider Notes (Signed)
[facility] DEPT Provider Note   CSN: [id number] Arrival date & time: 10/26/18  1627     History   Chief Complaint Chief Complaint  Patient presents with  . Abdominal Pain    HPI Misty Thomas is a 52 y.o. female with a PMHx of metastatic cervical cancer on chemo s/p radiation, pancytopenia, anemia, DM2, HIV, hypothyroidism, and other conditions listed below,  who presents to the ED with complaints of lower abdominal pain that began 1 hour ago.  Patient states that she was getting chemotherapy when her pain started.  She describes her pain as 8/10 constant aching lower abdominal pain that radiates to the left flank area, worse with walking, and somewhat improved with 1000mg  Tylenol and 30 mg morphine p.o. that she took earlier.  Of note, pt just had a Cystoscopy, bilateral retrograde pyelogram with interpretation and Right ureteral stent placement by Dr. Louis Meckel of urology on 10/24/18.  She states that she has not had a bowel movement since Tuesday, just prior to the ureteral stent placement.  She also endorses having dysuria, hematuria, and urinary frequency since the stent placement.  She denies fevers, chills, CP, SOB, nausea/vomiting, diarrhea, obstipation, melena, hematochezia, hematuria, dysuria, new/worsening vaginal bleeding/discharge, myalgias, arthralgias, numbness, tingling, focal weakness, lightheadedness, or any other complaints at this time. Denies recent travel, sick contacts, or suspicious food intake.   The history is provided by the patient and medical records. No language interpreter was used.  Abdominal Pain   Associated symptoms include constipation, dysuria, frequency and hematuria. Pertinent negatives include fever, diarrhea, nausea, vomiting, arthralgias and myalgias.    Past Medical History:  Diagnosis Date  . Acquired pancytopenia ([place]) 03/2018  . Anemia    Mild  . Cancer associated pain   . 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 Chemotherapy induced nausea and vomiting   . Diabetes mellitus type 2, diet-controlled ([place])    pt. denies No meds  . Frequency of urination   . History of cellulitis 2018   bilateral lower leg w/ mrsa  . History of external beam radiation therapy 02-05-2018  to 03-16-2018   cervical cancer  . History of MRSA infection 2018   w/ bilateral lower leg cellulitis  . HIV (human immunodeficiency virus infection) ([place])    asymptomatic  . Hypomagnesemia 03/2018   Severe---- takes oral magnesium and IV magnesium as needed (cancer center)  . Intermittent diarrhea    due to radiation/ chemo  . Nocturia   . Port-A-Cath in place    Power port  . Radiation burn    LOWER ABD.  04-06-2018  per is healing  . Subclinical hypothyroidism    w/ thyroiditis , dx 01-22-2018 PET scan  . Thrombocytopenia ([place])   . Wears glasses     Patient Active Problem List   Diagnosis Date Noted  . Goals of care, counseling/discussion 10/15/2018  . Hydronephrosis 10/10/2018  . Other constipation 07/20/2018  . Preventive measure 07/20/2018  . Routine screening for STI (sexually transmitted infection) 07/02/2018  . Vaccine counseling 07/02/2018  . Hypomagnesemia 02/22/2018  . Pancytopenia, acquired ([place]) 02/22/2018  . Chemotherapy-induced nausea 02/15/2018  . HIV (human immunodeficiency virus infection) ([place]) 02/09/2018  . Acquired hypothyroidism 02/08/2018  . Anemia, blood loss 02/08/2018  . Abnormal thyroid  uptake 01/25/2018  . Cancer associated pain 01/25/2018  . Diabetes mellitus without complication ([place]) 27/01/5008  . Abnormal vaginal bleeding   . Cervical cancer ([place]) 01/04/2018    Past Surgical History:  Procedure Laterality Date  . CYSTOSCOPY      retrograde pyelogram right ureteral stent placement  . CYSTOSCOPY W/ URETERAL STENT PLACEMENT Bilateral 10/24/2018   Procedure: CYSTOSCOPY WITH BILATERAL RETROGRADE PYELOGRAM RIGHT Wyvonnia Dusky STENT PLACEMENT;  Surgeon: Ardis Hughs, MD;  Location: [facility];  Service: Urology;  Laterality: Bilateral;  . EUA/ CERVICAL BX  01-04-2018   dr Ihor Dow  [facility]  . IR FLUORO GUIDE PORT INSERTION RIGHT  02/01/2018  . TANDEM RING INSERTION N/A 03/20/2018   Procedure: TANDEM RING INSERTION;  Surgeon: Gery Pray, MD;  Location: [facility];  Service: Urology;  Laterality: N/A;  . TANDEM RING INSERTION N/A 03/26/2018   Procedure: TANDEM RING INSERTION;  Surgeon: Gery Pray, MD;  Location: [facility];  Service: Urology;  Laterality: N/A;  . TANDEM RING INSERTION N/A 04/04/2018   Procedure: TANDEM RING INSERTION;  Surgeon: Gery Pray, MD;  Location: [facility];  Service: Urology;  Laterality: N/A;  . TANDEM RING INSERTION N/A 04/12/2018   Procedure: TANDEM RING INSERTION;  Surgeon: Gery Pray, MD;  Location: [facility];  Service: Urology;  Laterality: N/A;  . TANDEM RING INSERTION N/A 04/18/2018   Procedure: TANDEM RING INSERTION;  Surgeon: Gery Pray, MD;  Location: [facility];  Service: Urology;  Laterality: N/A;  . TUBAL LIGATION Bilateral 1990s     OB History    Gravida  5   Para  3   Term  3   Preterm      AB  2   Living  3     SAB      TAB  2   Ectopic      Multiple      Live Births               Home Medications    Prior to Admission medications   Medication Sig Start Date End Date Taking? Authorizing Provider  acetaminophen (TYLENOL) 500 MG tablet Take 1,000 mg by mouth every 6 (six) hours as needed for moderate pain or headache.     [provider]  BIKTARVY 50-200-25 MG TABS tablet TAKE 1 TABLET BY MOUTH DAILY. Patient taking differently: Take 1 tablet by mouth daily.   09/14/18   Comer, Okey Regal, MD  dexamethasone (DECADRON) 4 MG tablet Take 5 tabs at the night before and 5 tab the morning of chemotherapy, every 3 weeks, by mouth Patient taking differently: Take 20 mg by mouth See admin instructions. Take 20 mg by mouth at the night before and take 20 mg by mouth the morning of chemotherapy, every 3 weeks, by mouth 10/15/18   [name], MD  levothyroxine (SYNTHROID, LEVOTHROID) 25 MCG tablet Take 1 tablet (25 mcg total) by mouth daily before breakfast. Patient not taking: Reported on 10/17/2018 02/08/18   [name], MD  lidocaine-prilocaine (EMLA) cream Apply to affected area once 10/26/18   [name], MD  magnesium oxide (MAG-OX) 400 (241.3 Mg) MG tablet Take 1 tablet (400 mg total) by mouth 2 (two) times daily. 03/08/18   [name], MD  morphine (MS CONTIN) 60 MG 12 hr tablet Take 1 tablet (60 mg total) by mouth every 12 (twelve) hours. 10/15/18   [name], MD  morphine (MSIR) 30 MG tablet Take  1 tablet (30 mg total) by mouth every 6 (six) hours as needed for severe pain. 10/15/18   [name], MD  ondansetron (ZOFRAN) 8 MG tablet Take 1 tablet (8 mg total) by mouth 2 (two) times daily as needed for refractory nausea / vomiting. Start on day 3 after chemo. 10/26/18   [name], MD  phenazopyridine (PYRIDIUM) 200 MG tablet Take 1 tablet (200 mg total) by mouth 3 (three) times daily as needed for pain. 10/24/18   Ardis Hughs, MD  prochlorperazine (COMPAZINE) 10 MG tablet Take 1 tablet (10 mg total) by mouth every 6 (six) hours as needed (Nausea or vomiting). 10/26/18   [name], MD    Family History Family History  Problem Relation Age of Onset  . Cancer Maternal Grandmother        unknown ca    Social History Social History   Tobacco Use  . Smoking status: Never Smoker  . Smokeless tobacco: Never Used  Substance Use Topics  . Alcohol use: Not Currently    Frequency: Never  . Drug use: No     Allergies   Patient has no known  allergies.   Review of Systems Review of Systems  Constitutional: Negative for chills and fever.  Respiratory: Negative for shortness of breath.   Cardiovascular: Negative for chest pain.  Gastrointestinal: Positive for abdominal pain and constipation. Negative for blood in stool, diarrhea, nausea and vomiting.  Genitourinary: Positive for dysuria, frequency and hematuria. Negative for vaginal bleeding and vaginal discharge.  Musculoskeletal: Negative for arthralgias and myalgias.  Skin: Negative for color change.  Allergic/Immunologic: Positive for immunocompromised state (HIV, DM2, cancer).  Neurological: Negative for weakness, light-headedness and numbness.  Psychiatric/Behavioral: Negative for confusion.   All other systems reviewed and are negative for acute change except as noted in the HPI.    Physical Exam Updated Vital Signs BP (!) 178/85 (BP Location: Left Arm)   Pulse 60   Temp 97.8 F (36.6 C) (Oral)   Resp 18   LMP 02/03/2018   SpO2 100%   Physical Exam Vitals signs and nursing note reviewed.  Constitutional:      General: She is not in acute distress.    Appearance: Normal appearance. She is well-developed. She is not toxic-appearing.     Comments: Afebrile, nontoxic, NAD although appears uncomfortable initially  HENT:     Head: Normocephalic and atraumatic.  Eyes:     General:        Right eye: No discharge.        Left eye: No discharge.     Conjunctiva/sclera: Conjunctivae normal.  Neck:     Musculoskeletal: Normal range of motion and neck supple.  Cardiovascular:     Rate and Rhythm: Normal rate and regular rhythm.     Heart sounds: Normal heart sounds, S1 normal and S2 normal. No murmur. No friction rub. No gallop.   Pulmonary:     Effort: Pulmonary effort is normal. No respiratory distress.     Breath sounds: Normal breath sounds. No decreased breath sounds, wheezing, rhonchi or rales.  Abdominal:     General: Bowel sounds are decreased. There is  no distension.     Palpations: Abdomen is soft. Abdomen is not rigid.     Tenderness: There is abdominal tenderness in the right lower quadrant and suprapubic area. There is no right CVA tenderness, left CVA tenderness, guarding or rebound. Negative signs include Murphy's sign and McBurney's sign.     Comments: Soft,  nondistended, +BS throughout although somewhat diminished throughout, with RLQ and suprapubic TTP, no r/g/r, neg murphy's, neg mcburney's, no CVA TTP   Genitourinary:    Rectum: Tenderness present. No anal fissure, external hemorrhoid or internal hemorrhoid. Normal anal tone.     Comments: Chaperone present No gross blood noted on rectal exam, normal tone, significant rectal tenderness diffusely, moderate sized ?hole/defect to the anterior rectal vault going towards the vaginal vault. Soft stool in rectal vault. No definite mass or fissure, no hemorrhoids.  Musculoskeletal: Normal range of motion.  Skin:    General: Skin is warm and dry.     Findings: No rash.  Neurological:     Mental Status: She is alert and oriented to person, place, and time.     Sensory: Sensation is intact. No sensory deficit.     Motor: Motor function is intact.  Psychiatric:        Mood and Affect: Mood and affect normal.        Behavior: Behavior normal.      ED Treatments / Results  Labs (all labs ordered are listed, but only abnormal results are displayed) Labs Reviewed  CBC WITH DIFFERENTIAL/PLATELET - Abnormal; Notable for the following components:      Result Value   WBC 13.7 (*)    RBC 2.59 (*)    Hemoglobin 7.6 (*)    HCT 25.0 (*)    RDW 17.0 (*)    Platelets 683 (*)    Neutro Abs 12.9 (*)    Lymphs Abs 0.4 (*)    Abs Immature Granulocytes 0.30 (*)    All other components within normal limits  COMPREHENSIVE METABOLIC PANEL - Abnormal; Notable for the following components:   Sodium 134 (*)    CO2 16 (*)    Glucose, Bld 179 (*)    BUN 36 (*)    Creatinine, Ser 1.66 (*)     Calcium 8.4 (*)    Total Protein 8.3 (*)    Albumin 3.0 (*)    GFR calc non Af Amer 35 (*)    GFR calc Af Amer 41 (*)    All other components within normal limits  URINALYSIS, ROUTINE W REFLEX MICROSCOPIC - Abnormal; Notable for the following components:   APPearance HAZY (*)    Hgb urine dipstick LARGE (*)    Protein, ur 30 (*)    Leukocytes, UA LARGE (*)    RBC / HPF >50 (*)    WBC, UA >50 (*)    Bacteria, UA RARE (*)    All other components within normal limits  URINE CULTURE  LIPASE, BLOOD    EKG None  Radiology Ct Abdomen Pelvis W Contrast  Result Date: 10/26/2018 CLINICAL DATA:  Low back pain radiating to the back and left flank. Recent right ureteral stent placement. EXAM: CT ABDOMEN AND PELVIS WITH CONTRAST TECHNIQUE: Multidetector CT imaging of the abdomen and pelvis was performed using the standard protocol following bolus administration of intravenous contrast. CONTRAST:  43mL ISOVUE-300 IOPAMIDOL (ISOVUE-300) INJECTION 61% COMPARISON:  C-arm radiographs dated 10/24/2018. Abdomen and pelvis CT dated 01/03/2018. FINDINGS: Lower chest: Single small right lower lobe bullous. Hepatobiliary: Mild intrahepatic biliary ductal dilatation. Borderline dilated common duct. No obstructing stone or mass visualized. Normal appearing gallbladder. Focal fat deposition in the medial segment of the left lobe of the liver adjacent to the falciform ligament. Pancreas: Unremarkable. No pancreatic ductal dilatation or surrounding inflammatory changes. Spleen: Normal in size without focal abnormality. Adrenals/Urinary Tract: Right ureteral stent  in place. The distal portion is just beyond the ureterovesical junction in the posterior aspect of the urinary bladder on the right. The proximal portion is on the upper pole collecting system. Interval moderate dilatation of the right renal collecting system and proximal ureter. No visible ureteral calculi. Large number of bilateral pelvic phleboliths.  Previously demonstrated left renal cyst. Interval mild-to-moderate dilatation of the left renal collecting system without ureteral dilatation. No left ureteral calculi seen. Small amount of air in the urinary bladder. Stomach/Bowel: Stomach is within normal limits. Appendix appears normal. No evidence of bowel wall thickening, distention, or inflammatory changes. Vascular/Lymphatic: No significant vascular findings are present. No enlarged abdominal or pelvic lymph nodes. Reproductive: Uterus and bilateral adnexa are unremarkable. Other: A recurrent or residual fluid and gas collection with thin surrounding enhancing walls cysts demonstrated in the left pararectal region. On image number 64 series 2, this measures 3.9 x 2.5 cm. On sagittal image number 74, this measures 9.4 cm in length and 4.2 cm in AP diameter, including the presacral region. On coronal image number 76, this measures 2.8 cm in width. It is difficult to distinguish the rectum separate from this collection. Musculoskeletal: Lumbar and lower thoracic spine degenerative changes. IMPRESSION: 1. 9.4 x 4.2 x 2.8 cm recurrent or residual fluid and gas collection in the left perirectal and presacral region, as described above. This has features highly suspicious for a perirectal and presacral abscess. 2. Right ureteral stent in place with interval moderate right hydronephrosis and proximal hydroureter. 3. Interval mild-to-moderate left hydronephrosis without ureteral dilatation compatible interval UPJ obstruction. 4. Mild intrahepatic biliary ductal dilatation. This could be due to a nonvisualized common duct stone or stricture. Electronically Signed   By: Claudie Revering M.D.   On: 10/26/2018 19:07    Procedures Procedures (including critical care time)  Medications Ordered in ED Medications  sodium chloride (PF) 0.9 % injection (has no administration in time range)  morphine 4 MG/ML injection 4 mg (4 mg Intravenous Given 10/26/18 1713)  sodium  chloride 0.9 % bolus 1,000 mL (0 mLs Intravenous Stopped 10/26/18 1814)  iopamidol (ISOVUE-300) 61 % injection (80 mLs  Contrast Given 10/26/18 1831)  HYDROmorphone (DILAUDID) injection 1 mg (1 mg Intravenous Given 10/26/18 1853)  ondansetron (ZOFRAN) injection 4 mg (4 mg Intravenous Given 10/26/18 1853)     Initial Impression / Assessment and Plan / ED Course  I have reviewed the triage vital signs and the nursing notes.  Pertinent labs & imaging results that were available during my care of the patient were reviewed by me and considered in my medical decision making (see chart for details).     52 y.o. female here with lower abdominal pain that began about an hour prior to arrival.  Patient recently had a right ureteral stent placed for hydronephrosis due to ureteral obstruction.  On exam, patient appears uncomfortable however overall in no acute distress, with RLQ and suprapubic TTP, non-peritoneal, slightly hypoactive bowel sounds noted throughout.  No flank tenderness.  She has not had a bowel movement since prior to the ureteral procedure.  Will obtain labs and CT abdomen pelvis to evaluate her symptoms further.  Will give pain medicine.  Will reassess shortly.  7:46 PM CBC w/diff with mildly elevated WBC 13.7 similar to recent visits, chronic stable anemia and chronic stable thrombocytosis. CMP with chronically low Bicarb and stable kidney function. Lipase WNL. U/A with large Hgb, no nitrites, large leuks, >50 RBC and WBCs, but rare bacteria; could be from  recent ureteral procedure; will send for UCx. CT abd/pelv showing 9.4 x 4.2 x 2.8 cm fluid and gas collection in left perirectal and presacral region highly suspicious for perirectal and presacral abscess; right ureteral stent in place with moderate right hydronephrosis and hydroureter proximally; interval mild to moderate left hydronephrosis without ureteral dilation; and mild intrahepatic biliary ductal dilation. Rectal exam reveals  diffuse tenderness to the rectum, with a ?defect/hole to the anterior rectal vault going towards the vaginal vault area. Will discuss case with surgery service.   8:05 PM Spoke with Dr. Hassell Done of [facility], he would like medical admission and believes consultation with her gyn/onc teams would be recommendable, he's not sure there is anything operative for him to do at this time depending on what the end goal of her care is currently. He will be glad to consult on her case if needed, but graciously requests medical consultation for admission.   9:00 PM Dr. Tamala Julian of [facility] returning page and will admit. Holding orders to be placed by admitting team. Please see their notes for further documentation of care. I appreciate their help with this pleasant pt's care. Pt stable at time of admission.    Final Clinical Impressions(s) / ED Diagnoses   Final diagnoses:  Lower abdominal pain  Neutrophilic leukocytosis  Perirectal abscess  Chronic anemia  Thrombocytosis [facility])    ED Discharge Orders    [address] [place], [place] 10/26/18 2101    Milton Ferguson, MD 10/26/18 2310

## 2018-10-26 NOTE — ED Notes (Signed)
ED TO INPATIENT HANDOFF REPORT  Name/Age/Gender Misty Thomas 52 y.o. female  Code Status    Code Status Orders  (From admission, onward)         Start     Ordered   10/26/18 2206  Full code  Continuous     10/26/18 2209        Code Status History    This patient has a current code status but no historical code status.      Home/SNF/Other Home  Chief Complaint Abd. Pain  Level of Care/Admitting Diagnosis ED Disposition    ED Disposition Condition Comment   Admit  Hospital Area: [facility] [[id number]]  Level of Care: Telemetry [5]  Admit to tele based on following criteria: Complex arrhythmia (Bradycardia/Tachycardia)  Diagnosis: Perirectal abscess [409811]  Admitting Physician: Norval Morton [[id number]]  Attending Physician: Norval Morton [[id number]]  Estimated length of stay: past midnight tomorrow  Certification:: I certify this patient will need inpatient services for at least 2 midnights  PT Class (Do Not Modify): Inpatient [101]  PT Acc Code (Do Not Modify): Private [1]       Medical History Past Medical History:  Diagnosis Date  . Acquired pancytopenia ([place]) 03/2018  . Anemia    Mild  . Cancer associated pain   . 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 Chemotherapy induced nausea and vomiting   . Diabetes mellitus type 2, diet-controlled ([place])    pt. denies No meds  . Frequency of urination   . History of cellulitis 2018   bilateral lower leg w/ mrsa  . History of external beam radiation therapy 02-05-2018  to 03-16-2018   cervical cancer  . History of MRSA infection 2018   w/ bilateral lower leg cellulitis  . HIV (human immunodeficiency virus infection) ([place])    asymptomatic  .  Hypomagnesemia 03/2018   Severe---- takes oral magnesium and IV magnesium as needed (cancer center)  . Intermittent diarrhea    due to radiation/ chemo  . Nocturia   . Port-A-Cath in place    Power port  . Radiation burn    LOWER ABD.  04-06-2018  per is healing  . Subclinical hypothyroidism    w/ thyroiditis , dx 01-22-2018 PET scan  . Thrombocytopenia ([place])   . Wears glasses     Allergies No Known Allergies  IV Location/Drains/Wounds Patient Lines/Drains/Airways Status   Active Line/Drains/Airways    Name:   Placement date:   Placement time:   Site:   Days:   Implanted Port 02/01/18 Right Chest   02/01/18    0958    Chest   267   Peripheral IV 04/18/18 Right Hand   04/18/18    0700    Hand   191   Urethral Catheter amaya Latex 14 Fr.   04/18/18    0830    Latex   191   Ureteral Drain/Stent Right ureter 6 Fr.   10/24/18    1038    Right ureter   2   Incision (Closed) 01/04/18 Vagina Other (Comment)   01/04/18    1131     295   Incision (Closed) 02/01/18 Chest Right;Upper   02/01/18    1013     267   Incision (  Closed) 03/20/18 Vagina   03/20/18    0824     220   Incision (Closed) 03/26/18 Perineum Other (Comment)   03/26/18    0710     214   Incision (Closed) 04/04/18 Perineum Other (Comment)   04/04/18    0703     205   Incision (Closed) 04/12/18 Perineum Other (Comment)   04/12/18    0711     197   Incision (Closed) 04/18/18 Perineum Other (Comment)   04/18/18    0901     191          Labs/Imaging Results for orders placed or performed during the hospital encounter of 10/26/18 (from the past 48 hour(s))  CBC with Differential     Status: Abnormal   Collection Time: 10/26/18  5:02 PM  Result Value Ref Range   WBC 13.7 (H) 4.0 - 10.5 K/uL   RBC 2.59 (L) 3.87 - 5.11 MIL/uL   Hemoglobin 7.6 (L) 12.0 - 15.0 g/dL   HCT 25.0 (L) 36.0 - 46.0 %   MCV 96.5 80.0 - 100.0 fL   MCH 29.3 26.0 - 34.0 pg   MCHC 30.4 30.0 - 36.0 g/dL   RDW 17.0 (H) 11.5 - 15.5 %   Platelets 683 (H)  150 - 400 K/uL   nRBC 0.0 0.0 - 0.2 %   Neutrophils Relative % 94 %   Neutro Abs 12.9 (H) 1.7 - 7.7 K/uL   Lymphocytes Relative 3 %   Lymphs Abs 0.4 (L) 0.7 - 4.0 K/uL   Monocytes Relative 1 %   Monocytes Absolute 0.1 0.1 - 1.0 K/uL   Eosinophils Relative 0 %   Eosinophils Absolute 0.0 0.0 - 0.5 K/uL   Basophils Relative 0 %   Basophils Absolute 0.0 0.0 - 0.1 K/uL   Immature Granulocytes 2 %   Abs Immature Granulocytes 0.30 (H) 0.00 - 0.07 K/uL    Comment: Performed at [facility], [place] [address], [place], [place] [address]  Comprehensive metabolic panel     Status: Abnormal   Collection Time: 10/26/18  5:02 PM  Result Value Ref Range   Sodium 134 (L) 135 - 145 mmol/L   Potassium 4.4 3.5 - 5.1 mmol/L   Chloride 108 98 - 111 mmol/L   CO2 16 (L) 22 - 32 mmol/L   Glucose, Bld 179 (H) 70 - 99 mg/dL   BUN 36 (H) 6 - 20 mg/dL   Creatinine, Ser 1.66 (H) 0.44 - 1.00 mg/dL   Calcium 8.4 (L) 8.9 - 10.3 mg/dL   Total Protein 8.3 (H) 6.5 - 8.1 g/dL   Albumin 3.0 (L) 3.5 - 5.0 g/dL   AST 16 15 - 41 U/L   ALT 9 0 - 44 U/L   Alkaline Phosphatase 77 38 - 126 U/L   Total Bilirubin 0.6 0.3 - 1.2 mg/dL   GFR calc non Af Amer 35 (L) >60 mL/min   GFR calc Af Amer 41 (L) >60 mL/min   Anion gap 10 5 - 15    Comment: Performed at [facility], [place] [address], [place], [place] [address]  Lipase, blood     Status: None   Collection Time: 10/26/18  5:02 PM  Result Value Ref Range   Lipase 33 11 - 51 U/L    Comment: Performed at [facility], [place] [address], [place], [place] [address]  Urinalysis, Routine w reflex microscopic     Status: Abnormal   Collection  Time: 10/26/18  5:02 PM  Result Value Ref Range   Color, Urine YELLOW YELLOW   APPearance HAZY (A) CLEAR   Specific Gravity, Urine 1.019 1.005 - 1.030   pH 6.0 5.0 - 8.0   Glucose, UA NEGATIVE NEGATIVE mg/dL   Hgb urine dipstick LARGE (A) NEGATIVE   Bilirubin Urine NEGATIVE NEGATIVE    Ketones, ur NEGATIVE NEGATIVE mg/dL   Protein, ur 30 (A) NEGATIVE mg/dL   Nitrite NEGATIVE NEGATIVE   Leukocytes, UA LARGE (A) NEGATIVE   RBC / HPF >50 (H) 0 - 5 RBC/hpf   WBC, UA >50 (H) 0 - 5 WBC/hpf   Bacteria, UA RARE (A) NONE SEEN   Squamous Epithelial / LPF 0-5 0 - 5   WBC Clumps PRESENT     Comment: Performed at [facility], [place] [address] [place], [place] [address]   Ct Abdomen Pelvis W Contrast  Result Date: 10/26/2018 CLINICAL DATA:  Low back pain radiating to the back and left flank. Recent right ureteral stent placement. EXAM: CT ABDOMEN AND PELVIS WITH CONTRAST TECHNIQUE: Multidetector CT imaging of the abdomen and pelvis was performed using the standard protocol following bolus administration of intravenous contrast. CONTRAST:  74mL ISOVUE-300 IOPAMIDOL (ISOVUE-300) INJECTION 61% COMPARISON:  C-arm radiographs dated 10/24/2018. Abdomen and pelvis CT dated 01/03/2018. FINDINGS: Lower chest: Single small right lower lobe bullous. Hepatobiliary: Mild intrahepatic biliary ductal dilatation. Borderline dilated common duct. No obstructing stone or mass visualized. Normal appearing gallbladder. Focal fat deposition in the medial segment of the left lobe of the liver adjacent to the falciform ligament. Pancreas: Unremarkable. No pancreatic ductal dilatation or surrounding inflammatory changes. Spleen: Normal in size without focal abnormality. Adrenals/Urinary Tract: Right ureteral stent in place. The distal portion is just beyond the ureterovesical junction in the posterior aspect of the urinary bladder on the right. The proximal portion is on the upper pole collecting system. Interval moderate dilatation of the right renal collecting system and proximal ureter. No visible ureteral calculi. Large number of bilateral pelvic phleboliths. Previously demonstrated left renal cyst. Interval mild-to-moderate dilatation of the left renal collecting system without ureteral  dilatation. No left ureteral calculi seen. Small amount of air in the urinary bladder. Stomach/Bowel: Stomach is within normal limits. Appendix appears normal. No evidence of bowel wall thickening, distention, or inflammatory changes. Vascular/Lymphatic: No significant vascular findings are present. No enlarged abdominal or pelvic lymph nodes. Reproductive: Uterus and bilateral adnexa are unremarkable. Other: A recurrent or residual fluid and gas collection with thin surrounding enhancing walls cysts demonstrated in the left pararectal region. On image number 64 series 2, this measures 3.9 x 2.5 cm. On sagittal image number 74, this measures 9.4 cm in length and 4.2 cm in AP diameter, including the presacral region. On coronal image number 76, this measures 2.8 cm in width. It is difficult to distinguish the rectum separate from this collection. Musculoskeletal: Lumbar and lower thoracic spine degenerative changes. IMPRESSION: 1. 9.4 x 4.2 x 2.8 cm recurrent or residual fluid and gas collection in the left perirectal and presacral region, as described above. This has features highly suspicious for a perirectal and presacral abscess. 2. Right ureteral stent in place with interval moderate right hydronephrosis and proximal hydroureter. 3. Interval mild-to-moderate left hydronephrosis without ureteral dilatation compatible interval UPJ obstruction. 4. Mild intrahepatic biliary ductal dilatation. This could be due to a nonvisualized common duct stone or stricture. Electronically Signed   By: Claudie Revering M.D.   On: 10/26/2018 19:07  None  Pending Labs Unresulted Labs (From admission, onward)    Start     Ordered   10/27/18 0500  Magnesium  Tomorrow morning,   R     10/26/18 2209   10/27/18 4259  Basic metabolic panel  Tomorrow morning,   R     10/26/18 2209   10/27/18 0500  CBC  Tomorrow morning,   R     10/26/18 2209   10/26/18 2211  Prepare RBC  (Adult Blood Administration - Red Blood Cells)  Once,   R     Question Answer Comment  # of Units 1 unit   Transfusion Indications Symptomatic Anemia   If emergent release call blood bank [facility] [phone]   Instructions: Transfuse      10/26/18 2211   10/26/18 2210  Occult blood card to lab, stool RN will collect  Once,   R    Question:  Specimen to be collected by?  Answer:  RN will collect   10/26/18 2209   10/26/18 2203  Culture, blood (routine x 2)  BLOOD CULTURE X 2,   R     10/26/18 2209   10/26/18 2200  Sedimentation rate  Add-on,   R     10/26/18 2209   10/26/18 2200  C-reactive protein  Add-on,   R     10/26/18 2209   10/26/18 1728  Urine culture  ONCE - STAT,   STAT     10/26/18 1727          Vitals/Pain Today's Vitals   10/26/18 1651 10/26/18 1852 10/26/18 2037 10/26/18 2206  BP: (!) 178/85   (!) 141/77  Pulse: 60   65  Resp: 18   18  Temp: 97.8 F (36.6 C)     TempSrc: Oral     SpO2: 100%   91%  PainSc:  5  4      Isolation Precautions No active isolations  Medications Medications  sodium chloride (PF) 0.9 % injection (has no administration in time range)  morphine (MS CONTIN) 12 hr tablet 60 mg (has no administration in time range)  morphine (MSIR) tablet 30 mg (has no administration in time range)  bictegravir-emtricitabine-tenofovir AF (BIKTARVY) 50-200-25 MG per tablet 1 tablet (has no administration in time range)  levothyroxine (SYNTHROID, LEVOTHROID) tablet 25 mcg (has no administration in time range)  phenazopyridine (PYRIDIUM) tablet 200 mg (has no administration in time range)  magnesium oxide (MAG-OX) tablet 400 mg (has no administration in time range)  0.9 %  sodium chloride infusion (Manually program via Guardrails IV Fluids) (has no administration in time range)  0.9 %  sodium chloride infusion (has no administration in time range)  ondansetron (ZOFRAN) tablet 4 mg (has no administration in time range)    Or  ondansetron (ZOFRAN) injection 4 mg (has no administration in time range)   acetaminophen (TYLENOL) tablet 650 mg (has no administration in time range)    Or  acetaminophen (TYLENOL) suppository 650 mg (has no administration in time range)  0.9 %  sodium chloride infusion (Manually program via Guardrails IV Fluids) (has no administration in time range)  ceFEPIme (MAXIPIME) 2 g in sodium chloride 0.9 % 100 mL IVPB (has no administration in time range)  metroNIDAZOLE (FLAGYL) IVPB 500 mg (has no administration in time range)  morphine 4 MG/ML injection 4 mg (4 mg Intravenous Given 10/26/18 1713)  sodium chloride 0.9 % bolus 1,000 mL (0 mLs Intravenous Stopped 10/26/18 1814)  iopamidol (ISOVUE-300) 61 %  injection (80 mLs  Contrast Given 10/26/18 1831)  HYDROmorphone (DILAUDID) injection 1 mg (1 mg Intravenous Given 10/26/18 1853)  ondansetron (ZOFRAN) injection 4 mg (4 mg Intravenous Given 10/26/18 1853)    Mobility walks

## 2018-10-26 NOTE — Patient Instructions (Signed)
[place] Discharge Instructions for Patients Receiving Chemotherapy  Today you received the following chemotherapy agents :  Taxol, Carboplatin.  To help prevent nausea and vomiting after your treatment, we encourage you to take your nausea medication as prescribed.  TAKE  ZOFRAN 8 MG  TWICE DAILY FOR 2 DAYS TO PREVENT NAUSEA - START ON THIRD DAY AFTER CHEMO. TAKE COMPAZINE 10 MG EVERY 6 HOURS AS NEEDED FOR NAUSEA .  DO NOT DRIVE AFTER TAKING THIS MEDICATION AS IT CAN CAUSE DROWSINESS.  DO NOT EAT GREASY NOR SPICY FOODS.  DO DRINK LOTS OF FLUIDS ( MORE WATER ) AS TOLERATED.   If you develop nausea and vomiting that is not controlled by your nausea medication, call the clinic.   BELOW ARE SYMPTOMS THAT SHOULD BE REPORTED IMMEDIATELY:  *FEVER GREATER THAN 100.5 F  *CHILLS WITH OR WITHOUT FEVER  NAUSEA AND VOMITING THAT IS NOT CONTROLLED WITH YOUR NAUSEA MEDICATION  *UNUSUAL SHORTNESS OF BREATH  *UNUSUAL BRUISING OR BLEEDING  TENDERNESS IN MOUTH AND THROAT WITH OR WITHOUT PRESENCE OF ULCERS  *URINARY PROBLEMS  *BOWEL PROBLEMS  UNUSUAL RASH Items with * indicate a potential emergency and should be followed up as soon as possible.  Feel free to call the clinic should you have any questions or concerns. The clinic phone number is [phone].  Please show the [place] at check-in to the Emergency Department and triage nurse.

## 2018-10-27 ENCOUNTER — Encounter (HOSPITAL_COMMUNITY): Payer: Self-pay | Admitting: *Deleted

## 2018-10-27 ENCOUNTER — Other Ambulatory Visit: Payer: Self-pay

## 2018-10-27 DIAGNOSIS — K5900 Constipation, unspecified: Secondary | ICD-10-CM

## 2018-10-27 DIAGNOSIS — M549 Dorsalgia, unspecified: Secondary | ICD-10-CM | POA: Diagnosis present

## 2018-10-27 DIAGNOSIS — C539 Malignant neoplasm of cervix uteri, unspecified: Secondary | ICD-10-CM

## 2018-10-27 LAB — BASIC METABOLIC PANEL
Anion gap: 9 (ref 5–15)
BUN: 42 mg/dL — AB (ref 6–20)
CO2: 18 mmol/L — ABNORMAL LOW (ref 22–32)
CREATININE: 1.64 mg/dL — AB (ref 0.44–1.00)
Calcium: 8.1 mg/dL — ABNORMAL LOW (ref 8.9–10.3)
Chloride: 110 mmol/L (ref 98–111)
GFR calc Af Amer: 41 mL/min — ABNORMAL LOW (ref >60)
GFR calc non Af Amer: 36 mL/min — ABNORMAL LOW (ref >60)
Glucose, Bld: 168 mg/dL — ABNORMAL HIGH (ref 70–99)
Potassium: 4.3 mmol/L (ref 3.5–5.1)
Sodium: 137 mmol/L (ref 135–145)

## 2018-10-27 LAB — CBC
HEMATOCRIT: 30.5 % — AB (ref 36.0–46.0)
Hemoglobin: 9.6 g/dL — ABNORMAL LOW (ref 12.0–15.0)
MCH: 29.8 pg (ref 26.0–34.0)
MCHC: 31.5 g/dL (ref 30.0–36.0)
MCV: 94.7 fL (ref 80.0–100.0)
Platelets: 766 10*3/uL — ABNORMAL HIGH (ref 150–400)
RBC: 3.22 MIL/uL — ABNORMAL LOW (ref 3.87–5.11)
RDW: 16.5 % — AB (ref 11.5–15.5)
WBC: 14 10*3/uL — ABNORMAL HIGH (ref 4.0–10.5)
nRBC: 0 % (ref 0.0–0.2)

## 2018-10-27 LAB — PREPARE RBC (CROSSMATCH)

## 2018-10-27 LAB — MAGNESIUM: Magnesium: 2.2 mg/dL (ref 1.7–2.4)

## 2018-10-27 LAB — URINE CULTURE: Culture: <10000 — AB

## 2018-10-27 LAB — SEDIMENTATION RATE: SED RATE: 135 mm/h — AB (ref 0–22)

## 2018-10-27 LAB — ABO/RH: ABO/RH(D): O NEG

## 2018-10-27 LAB — C-REACTIVE PROTEIN: CRP: 9.3 mg/dL — ABNORMAL HIGH (ref <1.0)

## 2018-10-27 LAB — TSH: TSH: 6.721 u[IU]/mL — ABNORMAL HIGH (ref 0.350–4.500)

## 2018-10-27 LAB — AMMONIA: Ammonia: 27 umol/L (ref 9–35)

## 2018-10-27 MED ORDER — SODIUM CHLORIDE 0.9 % IV SOLN
2.0000 g | Freq: Two times a day (BID) | INTRAVENOUS | Status: DC
  Administered 2018-10-27 – 2018-10-30 (×7): 2 g via INTRAVENOUS
  Filled 2018-10-27 (×8): qty 2

## 2018-10-27 MED ORDER — MAGNESIUM HYDROXIDE 400 MG/5ML PO SUSP
15.0000 mL | Freq: Every day | ORAL | Status: DC
  Administered 2018-10-27 – 2018-10-31 (×5): 15 mL via ORAL
  Filled 2018-10-27 (×5): qty 30

## 2018-10-27 MED ORDER — SODIUM CHLORIDE 0.9 % IV SOLN
1.0000 g | Freq: Two times a day (BID) | INTRAVENOUS | Status: DC
  Administered 2018-10-27: 1 g via INTRAVENOUS
  Filled 2018-10-27 (×2): qty 1

## 2018-10-27 MED ORDER — POLYETHYLENE GLYCOL 3350 17 G PO PACK
17.0000 g | PACK | Freq: Every day | ORAL | Status: DC
  Administered 2018-10-27 – 2018-11-06 (×10): 17 g via ORAL
  Filled 2018-10-27 (×12): qty 1

## 2018-10-27 MED ORDER — LACTATED RINGERS IV SOLN
INTRAVENOUS | Status: AC
  Administered 2018-10-27 – 2018-10-28 (×2): via INTRAVENOUS

## 2018-10-27 MED ORDER — SODIUM CHLORIDE 0.9% FLUSH
10.0000 mL | Freq: Two times a day (BID) | INTRAVENOUS | Status: DC
  Administered 2018-10-31: 20 mL
  Administered 2018-11-01 – 2018-11-08 (×10): 10 mL

## 2018-10-27 MED ORDER — SODIUM CHLORIDE 0.9% FLUSH
10.0000 mL | INTRAVENOUS | Status: DC | PRN
  Administered 2018-11-05 – 2018-11-09 (×3): 10 mL
  Filled 2018-10-27 (×3): qty 40

## 2018-10-27 MED ORDER — DOCUSATE SODIUM 100 MG PO CAPS
100.0000 mg | ORAL_CAPSULE | Freq: Two times a day (BID) | ORAL | Status: DC
  Administered 2018-10-27 – 2018-11-02 (×12): 100 mg via ORAL
  Filled 2018-10-27 (×13): qty 1

## 2018-10-27 NOTE — Consult Note (Signed)
[place] [facility]    10/27/18  CC: deep pelvic pain  HPI: Misty Thomas is a 52 y.o. P3 h/o advanced cervical CA, s/p primary therapy ending May 2019 with progression (recurrence) Dec 2019.  She presented to the [facility] for abdominal pain and constipation ~4 days, noting she had a cysto/ureteral stent exchange 10/24/18 and had her first cycle of palliative chemo with Carbo/Tax earlier on the day of presentation to the PA-C (ie 10/26/18). She was sent to the ER for workup  In the ER a CTScan was ordered. There was concern for a perirectal/presacral abcess. We were called to determine need for surgical intervention.  Patient has had one dose of Miralax since admission. No effect yet. No current N/V. She is pacing the floor and focused on how I can help her have a bowel movement. She is difficult to illicit a history or symptoms from so much of my understanding is from the chart. She does confirm most of the PMH/PSH etc but is confused as to her treatment course thus far.  Note - She thinks her HIV is under good control stating she is compliant with her medication.  She is unsure about her CD4. States her viral load is undetectable. However the chart shows otherwise.  Oncologic Course She had "several months"of  abnormal uterine bleeding and pelvic pain.  She was seen at [facility] where a CT scan of the abdomen and pelvis was performed.  This showed a small amount of free fluid within the pelvis, there is a 3.5 cm left renal cyst.  An 18 mm right pelvic sidewall lesion likely representing an enlarged iliac chain lymph node.  The ovaries were poorly visualized.  There was a 5 x 5.5 complex collection or mass that is within the pelvis and appears to be in the region of the cervix and may represent a necrotic mass or neoplasm.  There is a 3 x 3 cm low attenuating lesion to the left of the rectum which may represent an abnormal lymph node.  On  initial physical examination there was red ratty tissue at the top of the vagina and the cervix was not visualized.  She was taken to the operating room on January 04, 2018 for an exam under anesthesia with biopsies as she was not able to tolerate an exam in the outpatient setting.  The patient was taken to the operating room where general anesthesia was performed she had insertion of a bivalve speculum in the vagina and the macerated cervix was noted a biopsy was taken from the cervix.  The pathology from this procedure revealed poorly differentiated carcinoma staining positive for cytokeratin, P 63, NP 16 and vimentin, negative for ER.  Pretreatment PET CT scan performed on January 22, 2018 revealed a large hypermetabolic cervical mass with local invasion into the perirectal space and bilateral pelvic lymphadenopathy.  There was small scattered bilateral hypermetabolic lymph nodes in the neck and axilla are of uncertain significance.  However due to a lack of abdominal nodal disease it was suspected that these were reactive.  She was provided with a presumed stage of stage IIIc cervical cancer and in accordance with NCCN guidelines received definitive curative intent chemoradiation therapy, including external beam radiation between February 05, 2018 and Mar 16, 2018 with 45 Gy to the cervix and pelvis, and a sidewall parametrial boost of 9 Gy, and cervical intracavitary brachii therapy with iridium 192 for a total dose of  27.5 Gy.  Her watery discharge resolved, however she continued to experience pelvic pain.   She received posttreatment PET scan on July 11, 2018 which revealed significant response to therapy with decreased hypermetabolic soft tissue mass and left perirectal presacral soft tissue density and bilateral iliac lymphadenopathy there was no new or progressive metastatic disease identified.  PET 10/2018 showed progression and she was started on chemotherapy for palliative intent with cycle 1  being given 10/26/18.  She was given narcotic Rx for pain with constipation warnings.    Current Facility-Administered Medications:  .  acetaminophen (TYLENOL) tablet 650 mg, 650 mg, Oral, Q6H PRN, 650 mg at 10/27/18 0447 **OR** acetaminophen (TYLENOL) suppository 650 mg, 650 mg, Rectal, Q6H PRN, Tamala Julian, Rondell A, MD .  bictegravir-emtricitabine-tenofovir AF (BIKTARVY) 50-200-25 MG per tablet 1 tablet, 1 tablet, Oral, Daily, Fuller Plan A, MD, 1 tablet at 10/27/18 0921 .  ceFEPIme (MAXIPIME) 2 g in sodium chloride 0.9 % 100 mL IVPB, 2 g, Intravenous, Q12H, Gadhia, Jigna M, RPH, Last Rate: 200 mL/hr at 10/27/18 2011, 2 g at 10/27/18 2011 .  docusate sodium (COLACE) capsule 100 mg, 100 mg, Oral, BID, Precious Haws B, MD, 100 mg at 10/27/18 1754 .  lactated ringers infusion, , Intravenous, Continuous, Elodia Florence., MD, Last Rate: 100 mL/hr at 10/27/18 2007 .  levothyroxine (SYNTHROID, LEVOTHROID) tablet 25 mcg, 25 mcg, Oral, Q0600, Fuller Plan A, MD, 25 mcg at 10/27/18 8341 .  magnesium hydroxide (MILK OF MAGNESIA) suspension 15 mL, 15 mL, Oral, Daily, Precious Haws B, MD, 15 mL at 10/27/18 1754 .  magnesium oxide (MAG-OX) tablet 400 mg, 400 mg, Oral, BID, Smith, Rondell A, MD, 400 mg at 10/27/18 2151 .  metroNIDAZOLE (FLAGYL) IVPB 500 mg, 500 mg, Intravenous, Q8H, Smith, Rondell A, MD, Last Rate: 100 mL/hr at 10/27/18 2158, 500 mg at 10/27/18 2158 .  morphine (Misty CONTIN) 12 hr tablet 60 mg, 60 mg, Oral, Q12H, Smith, Rondell A, MD, 60 mg at 10/27/18 2152 .  morphine (MSIR) tablet 30 mg, 30 mg, Oral, Q6H PRN, Fuller Plan A, MD, 30 mg at 10/27/18 0359 .  ondansetron (ZOFRAN) tablet 4 mg, 4 mg, Oral, Q6H PRN **OR** ondansetron (ZOFRAN) injection 4 mg, 4 mg, Intravenous, Q6H PRN, Smith, Rondell A, MD .  phenazopyridine (PYRIDIUM) tablet 200 mg, 200 mg, Oral, TID PRN, Tamala Julian, Rondell A, MD .  polyethylene glycol (MIRALAX / GLYCOLAX) packet 17 g, 17 g, Oral, Daily, Elodia Florence., MD, 17 g at 10/27/18 0932 .  sodium chloride flush (NS) 0.9 % injection 10-40 mL, 10-40 mL, Intracatheter, Q12H, Smith, Rondell A, MD .  sodium chloride flush (NS) 0.9 % injection 10-40 mL, 10-40 mL, Intracatheter, PRN, Norval Morton, MD  No Known Allergies  Past Medical History:  Diagnosis Date  . Acquired pancytopenia ([place]) 03/2018  . Anemia    Mild  . Cancer associated pain   . Cervical cancer [facility]) oncologist-  dr gorsuch/  dr Sondra Come   dx 01-04-2017-- Stage IIIB,  cercial adenocarcinoma w/ local invasion perirectal area-- chemo started 02-09-2018 and external beam radiation completed 03/16/2018 ,  started brachytherapy boost high dose radiation 03-20-2018  . Chemotherapy induced nausea and vomiting   . Diabetes mellitus type 2, diet-controlled ([place])    pt. denies No meds  . Frequency of urination   . History of cellulitis 2018   bilateral lower leg w/ mrsa  . History of external beam radiation therapy 02-05-2018  to 03-16-2018   cervical  cancer  . History of MRSA infection 2018   w/ bilateral lower leg cellulitis  . HIV (human immunodeficiency virus infection) ([place])    asymptomatic  . Hypomagnesemia 03/2018   Severe---- takes oral magnesium and IV magnesium as needed (cancer center)  . Intermittent diarrhea    due to radiation/ chemo  . Nocturia   . Port-A-Cath in place    Power port  . Radiation burn    LOWER ABD.  04-06-2018  per is healing  . Subclinical hypothyroidism    w/ thyroiditis , dx 01-22-2018 PET scan  . Thrombocytopenia ([place])   . Wears glasses     Past Surgical History:  Procedure Laterality Date  . CYSTOSCOPY     retrograde pyelogram right ureteral stent placement  . CYSTOSCOPY W/ URETERAL STENT PLACEMENT Bilateral 10/24/2018   Procedure: CYSTOSCOPY WITH BILATERAL RETROGRADE PYELOGRAM RIGHT Wyvonnia Dusky STENT PLACEMENT;  Surgeon: Ardis Hughs, MD;  Location: [facility];  Service: Urology;  Laterality: Bilateral;  . EUA/ CERVICAL BX  01-04-2018    dr Ihor Dow  [facility]  . IR FLUORO GUIDE PORT INSERTION RIGHT  02/01/2018  . TANDEM RING INSERTION N/A 03/20/2018   Procedure: TANDEM RING INSERTION;  Surgeon: Gery Pray, MD;  Location: [facility];  Service: Urology;  Laterality: N/A;  . TANDEM RING INSERTION N/A 03/26/2018   Procedure: TANDEM RING INSERTION;  Surgeon: Gery Pray, MD;  Location: [facility];  Service: Urology;  Laterality: N/A;  . TANDEM RING INSERTION N/A 04/04/2018   Procedure: TANDEM RING INSERTION;  Surgeon: Gery Pray, MD;  Location: [facility];  Service: Urology;  Laterality: N/A;  . TANDEM RING INSERTION N/A 04/12/2018   Procedure: TANDEM RING INSERTION;  Surgeon: Gery Pray, MD;  Location: [facility];  Service: Urology;  Laterality: N/A;  . TANDEM RING INSERTION N/A 04/18/2018   Procedure: TANDEM RING INSERTION;  Surgeon: Gery Pray, MD;  Location: [facility];  Service: Urology;  Laterality: N/A;  . TUBAL LIGATION Bilateral 1990s    Family History  Problem Relation Age of Onset  . Cancer Maternal Grandmother        unknown ca   Social Hx - denies EtOH, Illicits, Tobacco  OB H - P3 LMP - unsure if menopausal  Ct Head Wo Contrast  Result Date: 10/26/2018 CLINICAL DATA:  Altered consciousness. History of cervical cancer. EXAM: CT HEAD WITHOUT CONTRAST TECHNIQUE: Contiguous axial images were obtained from the base of the skull through the vertex without intravenous contrast. COMPARISON:  06/19/2013 FINDINGS: Brain: No mass lesion, hemorrhage, hydrocephalus, acute infarct, intra-axial, or extra-axial fluid collection. Vascular: No hyperdense vessel or unexpected calcification. Skull: Normal Sinuses/Orbits: Normal imaged portions of the orbits and globes. Clear paranasal sinuses and mastoid air cells. Other: None. IMPRESSION: Normal head CT. Electronically Signed   By: Abigail Miyamoto M.D.   On: 10/26/2018 23:54   Ct Abdomen Pelvis  W Contrast  Result Date: 10/26/2018 CLINICAL DATA:  Low back pain radiating to the back and left flank. Recent right ureteral stent placement. EXAM: CT ABDOMEN AND PELVIS WITH CONTRAST TECHNIQUE: Multidetector CT imaging of the abdomen and pelvis was performed using the standard protocol following bolus administration of intravenous contrast. CONTRAST:  50m ISOVUE-300 IOPAMIDOL (ISOVUE-300) INJECTION 61% COMPARISON:  C-arm radiographs dated 10/24/2018. Abdomen and pelvis CT dated 01/03/2018. FINDINGS: Lower chest: Single small right lower lobe bullous. Hepatobiliary: Mild intrahepatic biliary ductal dilatation. Borderline dilated common duct. No obstructing stone or mass visualized. Normal appearing  gallbladder. Focal fat deposition in the medial segment of the left lobe of the liver adjacent to the falciform ligament. Pancreas: Unremarkable. No pancreatic ductal dilatation or surrounding inflammatory changes. Spleen: Normal in size without focal abnormality. Adrenals/Urinary Tract: Right ureteral stent in place. The distal portion is just beyond the ureterovesical junction in the posterior aspect of the urinary bladder on the right. The proximal portion is on the upper pole collecting system. Interval moderate dilatation of the right renal collecting system and proximal ureter. No visible ureteral calculi. Large number of bilateral pelvic phleboliths. Previously demonstrated left renal cyst. Interval mild-to-moderate dilatation of the left renal collecting system without ureteral dilatation. No left ureteral calculi seen. Small amount of air in the urinary bladder. Stomach/Bowel: Stomach is within normal limits. Appendix appears normal. No evidence of bowel wall thickening, distention, or inflammatory changes. Vascular/Lymphatic: No significant vascular findings are present. No enlarged abdominal or pelvic lymph nodes. Reproductive: Uterus and bilateral adnexa are unremarkable. Other: A recurrent or residual  fluid and gas collection with thin surrounding enhancing walls cysts demonstrated in the left pararectal region. On image number 64 series 2, this measures 3.9 x 2.5 cm. On sagittal image number 74, this measures 9.4 cm in length and 4.2 cm in AP diameter, including the presacral region. On coronal image number 76, this measures 2.8 cm in width. It is difficult to distinguish the rectum separate from this collection. Musculoskeletal: Lumbar and lower thoracic spine degenerative changes. IMPRESSION: 1. 9.4 x 4.2 x 2.8 cm recurrent or residual fluid and gas collection in the left perirectal and presacral region, as described above. This has features highly suspicious for a perirectal and presacral abscess. 2. Right ureteral stent in place with interval moderate right hydronephrosis and proximal hydroureter. 3. Interval mild-to-moderate left hydronephrosis without ureteral dilatation compatible interval UPJ obstruction. 4. Mild intrahepatic biliary ductal dilatation. This could be due to a nonvisualized common duct stone or stricture. Electronically Signed   By: Claudie Revering M.D.   On: 10/26/2018 19:07   Nm Pet Image Restag (ps) Skull Base To Thigh  Result Date: 10/09/2018 CLINICAL DATA:  Subsequent treatment strategy for cervical cancer with periaortic lymph node metastasis. Radiation therapy in June. EXAM: NUCLEAR MEDICINE PET SKULL BASE TO THIGH TECHNIQUE: 10.8 mCi F-18 FDG was injected intravenously. Full-ring PET imaging was performed from the skull base to thigh after the radiotracer. CT data was obtained and used for attenuation correction and anatomic localization. Fasting blood glucose: 81 mg/dl COMPARISON:  07/11/2018 FINDINGS: Mild to moderate degradation secondary to patient body habitus. Mediastinal blood pool activity: SUV max 3.3 NECK: Muscular activity within the neck. Diffuse thyroid hypermetabolism again identified, including at a S.U.V. max of 10.3. No cervical nodal hypermetabolism. Incidental  CT findings: No cervical adenopathy. CHEST: No thoracic nodal hypermetabolism. No pulmonary parenchymal hypermetabolism. Incidental CT findings: Right Port-A-Cath tip at high right atrium. Tiny hiatal hernia. ABDOMEN/PELVIS: Foci of retrocaval hypermetabolism may be due to small nodes. Examples including at a S.U.V. max of 9.4 on image 116. Hypermetabolism along the left common iliac chain measures a S.U.V. max of 5.9 on image 134/4 versus a S.U.V. max of 3.1 at the same level on the prior exam (when remeasured). Left perirectal hypermetabolism and soft tissue fullness/gas are difficult to differentiate from the adjacent rectum today. Felt to measure on the order of 3.2 cm and a S.U.V. max of 12.6 on image 160/4. Compare 3.0 cm and a S.U.V. max of 5.6 on the prior. Hypermetabolism within the lower  uterine segment and cervical region. This measures a S.U.V. max of 10.9 today, and demonstrates central gas on image 153/4. Compare a S.U.V. max of 4.7 on the prior. Incidental CT findings: Mild-to-moderate hydronephrosis, similar to minimally increased. Low-density left renal lesion is likely a cyst. Large colonic stool burden. SKELETON: Diffuse marrow hypermetabolism could be related to stimulation by chemotherapy. Incidental CT findings: none IMPRESSION: 1. Degradation secondary to patient body habitus. 2. Disease progression, as evidenced by increase in hypermetabolism within the cervix and a perirectal area of soft tissue density and gas. Progressive left common iliac and new retroperitoneal abdominal hypermetabolism, suspicious for nodal metastasis. 3. Gas within the cervical region could be related to necrosis from radiation therapy. Fistulous communication to bowel could look similar. 4. Similar to mild increase in moderate right-sided hydronephrosis. 5. Thyroid hypermetabolism, again suggesting thyroiditis. Electronically Signed   By: Abigail Miyamoto M.D.   On: 10/09/2018 15:07   Dg C-arm 1-60 Min-no  Report  Result Date: 10/24/2018 Fluoroscopy was utilized by the requesting physician.  No radiographic interpretation.   Review of Systems  Gastrointestinal: Positive for constipation.  Genitourinary: Positive for pelvic pain and vaginal discharge.   Musculoskeletal: Positive for back pain.  All other systems reviewed and are negative.    Vitals:   10/27/18 1332 10/27/18 2055  BP: (!) 147/79 135/89  Pulse: (!) 51 62  Resp: 16 14  Temp: (!) 97.5 F (36.4 C) 98.8 F (37.1 C)  SpO2: 98% 100%   General :  Well developed, 52 y.o., female in moderate distress HEENT:  Normocephalic/atraumatic, symmetric, EOMI, eyelids normal Neck:   No visible masses.  Respiratory:  Respirations unlabored, no use of accessory muscles CV:   Deferred Breast:  Deferred Musculoskeletal: Normal muscle strength. Abdomen:  No visible masses or protrusion Extremities:  No visible edema or deformities Skin:   Normal inspection Neuro/Psych:  No focal motor deficit, Difficult historian with mild confusion. Normal gait. Alert and oriented to person   Assessment/Plan:  1. H/o stage IIIC cervical poorly differentiated carcinoma now with recurrence of disease ~7 months following curative intent therapy o Prognosis is poor o Agree with Dr. Alvy Bimler on palliative intent chemo and checking of PDL1 etc for possible immunotherapy 2. Pain o I suspect this is due to disease progression and partly constipation o Continue current pain regimen o Palliative care consult may be helpful to help with management strategies while admitted. 3. Question of rectal abcess o Looking at her original scans with primary diagnosis this area of concern was present, although increased in size now o I suspect this represents tumor necrosis and progression of disease rather than a true abcess. o I do not recommend surgical or interventional management at this time o Antibiotics per primary team  o I will do an exam in the morning to  assess the vaginal canal and rectal wall. 4. Constipation o She was started on new narcotic last week. o May need better outpatient bowel regimen if she ends up cleared and we work toward dispo  Misty Caprice, MD Gynecologic Oncologist

## 2018-10-27 NOTE — Progress Notes (Signed)
PROGRESS NOTE    Misty Thomas  GGY:[id number] DOB: 08-May-1966 DOA: 10/26/2018 PCP: Patient, No Pcp Per   Brief Narrative:  Per HPI Misty Thomas is Misty Thomas 52 y.o. female with medical history significant of HIV, cervical cancer on chemotherapy, DM type II, anemia; who presents with complaints of lower back pain.  Patient appears to be somewhat of Mizael Sagar poor historian and intermittently talks to herself losing track of conversation.  She notes having back pain for some time even prior to her recent cystostomy with stent placement on the right for hydronephrosis and hydroureter.  She describes the pain as constant and is located right above her buttocks.  Associated symptoms include complains of chills, intermittent shortness of breath, blood in stools, and previously having stool coming out of her vagina.  Patient was sent from her oncologist office today due to her symptoms.  Patient also reports that she is intermittently been talking out of her head and talking to herself.  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 Alvy Bimler.  She presented with lower back pain and abdominal pain.  CT showed findings concerning for perirectal and presacral abscess. General surgery was consulted, but recommended to discuss with oncology to determine plan of care.  Per discussion with gyn oncology, suspect that these imaging findings represent disease progression as opposed to abscess.  - Planning for pelvic/rectal exam in the AM - Gyn onc c/s, appreciate recs - Discussed with Dr. Alen Blew, recommended gyn onc c/s, Dr. Alvy Bimler added to treatment team for Monday - Elevated  ESR/CRP - Follow-up blood and urine cultures - Empiric antibiotics cefepime and metronidazole IV  Leukocytosis possibly related to above, continue abx, follow cx  Bilateral hydronephrosis, chronic kidney disease stage III: Patient's creatinine previously had been 1.3 in August, but trended up to 2.81 on 12/3.  She just underwent stent placement with urology on 12/18 with Dr. Sampson Si. 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   - Appreciate urology recs - planning for outpatient follow up with renal US (no need for L JJ stent placement unless renal function to deteriorate, see note)   Possible urinary tract infection: urine cx negative, continue abx for above  Acute blood loss anemia and/or chemo induced, small rectal bleeding: Hemoglobin 7.6 on admission.   - S/p 1 unit pRBC, improved - follow stool guiac, hemodynamically stable, follow  Acute encephalopathy:  Pt seems confused.  Able to respond appropriately at times, but intermittently talking to herself to admitting physician. - Head Ct without notable abnormalities - Follow ammonia, B12, folate, RPR, VBG - Neurochecks every 4 hours  HIV: CD4 count 130 on 06/19/2018. - Continue biktarvy  Diabetes mellitus type 2, without complication.  Patient appears to be diet-controlled last hemoglobin A1c 5.5 on 10/23/2018.   Hypothyroidism - Check TSH (6.7, slightly elevated) - Continue levothyroxine  Intrahepatic Biliary Ductal Dilatation: normal bili/alk phos on presentation, continue to monitor  NAGMA: continue to monitor, follow  DVT prophylaxis: SCD's Code Status: full  Family Communication: husband at bedside Disposition Plan: pending further improvement   Consultants:  Gyn onc  Oncology  Procedures:   none  Antimicrobials:  Anti-infectives (From admission, onward)   Start     Dose/Rate Route Frequency  Ordered Stop   10/27/18 2000  ceFEPIme (MAXIPIME) 2 g in sodium chloride 0.9 % 100 mL IVPB     2 g 200 mL/hr over 30 Minutes Intravenous Every 12 hours 10/27/18 1643     10/27/18 1000  bictegravir-emtricitabine-tenofovir AF (BIKTARVY) 50-200-25 MG per tablet 1 tablet     1 tablet Oral Daily 10/26/18 2209     10/27/18 1000  ceFEPIme (MAXIPIME) 1 g in sodium chloride 0.9 % 100 mL IVPB  Status:  Discontinued     1 g 200 mL/hr over 30 Minutes Intravenous Every 12 hours 10/27/18 0335 10/27/18 1643   10/26/18 2230  metroNIDAZOLE (FLAGYL) IVPB 500 mg     500 mg 100 mL/hr over 60 Minutes Intravenous Every 8 hours 10/26/18 2212     10/26/18 2215  ceFEPIme (MAXIPIME) 2 g in sodium chloride 0.9 % 100 mL IVPB     2 g 200 mL/hr over 30 Minutes Intravenous  Once 10/26/18 2212 10/27/18 0106     Subjective: Presented due to abdominal and back pain. Husband at bedside. Slightly confused.  Objective: Vitals:   10/27/18 0437 10/27/18 0439 10/27/18 0640 10/27/18 1332  BP: 118/79  121/69 (!) 147/79  Pulse: 66 71 68 (!) 51  Resp: '16  14 16  ' Temp: 98 F (36.7 C)  98.3 F (36.8 C) (!) 97.5 F (36.4 C)  TempSrc: Oral  Oral Oral  SpO2: 99% 98% 97% 98%  Weight:      Height:        Intake/Output Summary (Last 24 hours) at 10/27/2018 1907 Last data filed at 10/27/2018 1756 Gross per 24 hour  Intake 2137.75 ml  Output -  Net 2137.75 ml   Filed Weights   10/26/18 2350  Weight: 86 kg    Examination:  General exam: Appears calm and comfortable  Respiratory system: Clear to auscultation. Respiratory effort normal. Cardiovascular system: S1 & S2 heard, RRR.  Gastrointestinal system: Abdomen is nondistended, soft and nontender Central nervous system: Alert, seems mildly confused. No focal neurological deficits. Extremities: no LEE Skin: No rashes, lesions or ulcers Psychiatry: Judgement and insight appear normal. Mood & affect appropriate.     Data Reviewed: I have personally reviewed  following labs and imaging studies  CBC: Recent Labs  Lab 10/23/18 1019 10/25/18 1159 10/26/18 1702 10/27/18 0932  WBC 15.4* 16.6* 13.7* 14.0*  NEUTROABS  --  15.0* 12.9*  --   HGB 8.5* 7.4* 7.6* 9.6*  HCT 27.7* 23.6* 25.0* 30.5*  MCV 98.2 94.8 96.5 94.7  PLT 765* 799* 683* 673*   Basic Metabolic Panel: Recent Labs  Lab 10/23/18 1019 10/25/18 1159 10/26/18 1702 10/27/18 0932  NA 135 136 134* 137  K 4.1 4.5 4.4 4.3  CL 106 106 108 110  CO2 17* 18* 16* 18*  GLUCOSE 111* 120* 179* 168*  BUN 33* 37* 36* 42*  CREATININE 2.18* 2.19* 1.66* 1.64*  CALCIUM 9.7 9.8 8.4* 8.1*  MG  --  2.0  --  2.2   GFR: Estimated Creatinine Clearance: 40 mL/min (Naftoli Penny) (by C-G formula based on SCr of 1.64 mg/dL (H)). Liver Function Tests: Recent Labs  Lab 10/25/18 1159 10/26/18 1702  AST 15 16  ALT 8 9  ALKPHOS 92 77  BILITOT 0.3 0.6  PROT 9.3* 8.3*  ALBUMIN 3.0* 3.0*   Recent Labs  Lab  10/26/18 1702  LIPASE 33   No results for input(s): AMMONIA in the last 168 hours. Coagulation Profile: No results for input(s): INR, PROTIME in the last 168 hours. Cardiac Enzymes: No results for input(s): CKTOTAL, CKMB, CKMBINDEX, TROPONINI in the last 168 hours. BNP (last 3 results) No results for input(s): PROBNP in the last 8760 hours. HbA1C: No results for input(s): HGBA1C in the last 72 hours. CBG: Recent Labs  Lab 10/24/18 0824 10/24/18 1224  GLUCAP 102* 92   Lipid Profile: No results for input(s): CHOL, HDL, LDLCALC, TRIG, CHOLHDL, LDLDIRECT in the last 72 hours. Thyroid Function Tests: Recent Labs    10/27/18 0932  TSH 6.721*   Anemia Panel: No results for input(s): VITAMINB12, FOLATE, FERRITIN, TIBC, IRON, RETICCTPCT in the last 72 hours. Sepsis Labs: No results for input(s): PROCALCITON, LATICACIDVEN in the last 168 hours.  Recent Results (from the past 240 hour(s))  Urine culture     Status: Abnormal   Collection Time: 10/26/18  5:28 PM  Result Value Ref Range Status    Specimen Description   Final    URINE, RANDOM Performed at [facility] [address], [place], [place] [address]    Special Requests   Final    NONE Performed at [facility], [place] [address] [place], [place] [address]    Culture (Maxie Slovacek)  Final    <10,000 COLONIES/mL INSIGNIFICANT GROWTH Performed at [facility] [address], [place], [place] [address]    Report Status 10/27/2018 FINAL  Final         Radiology Studies: Ct Head Wo Contrast  Result Date: 10/26/2018 CLINICAL DATA:  Altered consciousness. History of cervical cancer. EXAM: CT HEAD WITHOUT CONTRAST TECHNIQUE: Contiguous axial images were obtained from the base of the skull through the vertex without intravenous contrast. COMPARISON:  06/19/2013 FINDINGS: Brain: No mass lesion, hemorrhage, hydrocephalus, acute infarct, intra-axial, or extra-axial fluid collection. Vascular: No hyperdense vessel or unexpected calcification. Skull: Normal Sinuses/Orbits: Normal imaged portions of the orbits and globes. Clear paranasal sinuses and mastoid air cells. Other: None. IMPRESSION: Normal head CT. Electronically Signed   By: Abigail Miyamoto M.D.   On: 10/26/2018 23:54   Ct Abdomen Pelvis W Contrast  Result Date: 10/26/2018 CLINICAL DATA:  Low back pain radiating to the back and left flank. Recent right ureteral stent placement. EXAM: CT ABDOMEN AND PELVIS WITH CONTRAST TECHNIQUE: Multidetector CT imaging of the abdomen and pelvis was performed using the standard protocol following bolus administration of intravenous contrast. CONTRAST:  49m ISOVUE-300 IOPAMIDOL (ISOVUE-300) INJECTION 61% COMPARISON:  C-arm radiographs dated 10/24/2018. Abdomen and pelvis CT dated 01/03/2018. FINDINGS: Lower chest: Single small right lower lobe bullous. Hepatobiliary: Mild intrahepatic biliary ductal dilatation. Borderline dilated common duct. No obstructing stone or mass visualized. Normal appearing  gallbladder. Focal fat deposition in the medial segment of the left lobe of the liver adjacent to the falciform ligament. Pancreas: Unremarkable. No pancreatic ductal dilatation or surrounding inflammatory changes. Spleen: Normal in size without focal abnormality. Adrenals/Urinary Tract: Right ureteral stent in place. The distal portion is just beyond the ureterovesical junction in the posterior aspect of the urinary bladder on the right. The proximal portion is on the upper pole collecting system. Interval moderate dilatation of the right renal collecting system and proximal ureter. No visible ureteral calculi. Large number of bilateral pelvic phleboliths. Previously demonstrated left renal cyst. Interval mild-to-moderate dilatation of the left renal collecting system without ureteral dilatation. No left ureteral calculi seen.  Small amount of air in the urinary bladder. Stomach/Bowel: Stomach is within normal limits. Appendix appears normal. No evidence of bowel wall thickening, distention, or inflammatory changes. Vascular/Lymphatic: No significant vascular findings are present. No enlarged abdominal or pelvic lymph nodes. Reproductive: Uterus and bilateral adnexa are unremarkable. Other: Vernie Piet recurrent or residual fluid and gas collection with thin surrounding enhancing walls cysts demonstrated in the left pararectal region. On image number 64 series 2, this measures 3.9 x 2.5 cm. On sagittal image number 74, this measures 9.4 cm in length and 4.2 cm in AP diameter, including the presacral region. On coronal image number 76, this measures 2.8 cm in width. It is difficult to distinguish the rectum separate from this collection. Musculoskeletal: Lumbar and lower thoracic spine degenerative changes. IMPRESSION: 1. 9.4 x 4.2 x 2.8 cm recurrent or residual fluid and gas collection in the left perirectal and presacral region, as described above. This has features highly suspicious for Gearlene Godsil perirectal and presacral abscess.  2. Right ureteral stent in place with interval moderate right hydronephrosis and proximal hydroureter. 3. Interval mild-to-moderate left hydronephrosis without ureteral dilatation compatible interval UPJ obstruction. 4. Mild intrahepatic biliary ductal dilatation. This could be due to Salvatore Shear nonvisualized common duct stone or stricture. Electronically Signed   By: Claudie Revering M.D.   On: 10/26/2018 19:07        Scheduled Meds: . bictegravir-emtricitabine-tenofovir AF  1 tablet Oral Daily  . docusate sodium  100 mg Oral BID  . levothyroxine  25 mcg Oral Q0600  . magnesium hydroxide  15 mL Oral Daily  . magnesium oxide  400 mg Oral BID  . morphine  60 mg Oral Q12H  . polyethylene glycol  17 g Oral Daily  . sodium chloride flush  10-40 mL Intracatheter Q12H   Continuous Infusions: . sodium chloride 100 mL/hr at 10/27/18 1756  . ceFEPime (MAXIPIME) IV    . metronidazole Stopped (10/27/18 1424)     LOS: 1 day    Time spent: over 30 min    Fayrene Helper, MD [facility] Pager [phone]  If 7PM-7AM, please contact night-coverage [email or web address] Password [facility] 10/27/2018, 7:07 PM

## 2018-10-27 NOTE — Consult Note (Signed)
Brief Note/ Full consult to follow  Reviewed history with patient Reviewed imaging.  A/P Cervical cancer with progression on palliative intent chemotherapy with Cycle 1 given 10/26/18  ? Abcess  - I suspect this is disease progression rather than an abcess after reviewing her imaging.  Constipation - add milk of mag and colace.  - I will do a pelvic/rectal in the morning  -S. Gerarda Fraction, [place]

## 2018-10-27 NOTE — Progress Notes (Signed)
Pharmacy Antibiotic Note  Misty Thomas is a 52 y.o. female admitted on 10/26/2018 with Intra-abdominal infection.  Pharmacy has been consulted for Cefepime dosing.  Plan: Cefepime 2gm iv x1, then 1gm iv q12hr  Height: 5\' 1"  (154.9 cm) Weight: 189 lb 9.5 oz (86 kg) IBW/kg (Calculated) : 47.8  Temp (24hrs), Avg:97.9 F (36.6 C), Min:97.8 F (36.6 C), Max:98.2 F (36.8 C)  Recent Labs  Lab 10/23/18 1019 10/25/18 1159 10/26/18 1702  WBC 15.4* 16.6* 13.7*  CREATININE 2.18* 2.19* 1.66*    Estimated Creatinine Clearance: 39.5 mL/min (A) (by C-G formula based on SCr of 1.66 mg/dL (H)).    No Known Allergies  Antimicrobials this admission: Flagyl 10/27/2018 >> Cefepime 10/27/2018 >>   Dose adjustments this admission: -  Microbiology results: -  Thank you for allowing pharmacy to be a part of this patient's care.  Nani Skillern Crowford 10/27/2018 3:36 AM

## 2018-10-27 NOTE — Progress Notes (Signed)
Pharmacy Antibiotic Note  Misty Thomas is a 52 y.o. female admitted on 10/26/2018 with perirectal and presacral abscess and possible UTI.  Pharmacy has been consulted for Cefepime dosing.  Plan: Adjust Cefepime to 2g IV q12h (immunocompromised patient). Continue Metronidazole per MD.  Monitor renal function, cultures, clinical course.   Height: 5\' 1"  (154.9 cm) Weight: 189 lb 9.5 oz (86 kg) IBW/kg (Calculated) : 47.8  Temp (24hrs), Avg:97.9 F (36.6 C), Min:97.5 F (36.4 C), Max:98.3 F (36.8 C)  Recent Labs  Lab 10/23/18 1019 10/25/18 1159 10/26/18 1702 10/27/18 0932  WBC 15.4* 16.6* 13.7* 14.0*  CREATININE 2.18* 2.19* 1.66* 1.64*    Estimated Creatinine Clearance: 40 mL/min (A) (by C-G formula based on SCr of 1.64 mg/dL (H)).    No Known Allergies  Antimicrobials this admission: 12/21 Cefepime >> 12/21 Metronidazole >>   Microbiology results: 12/20 UCx: insignificant growth 12/21 BCx: sent  Thank you for allowing pharmacy to be a part of this patient's care.   Lindell Spar, PharmD, BCPS Pager: [phone] 10/27/2018 4:47 PM

## 2018-10-27 NOTE — Consult Note (Signed)
Urology Consult   Physician requesting consult: Milton Ferguson, MD  Reason for consult: Left hydronephrosis  History of Present Illness: Misty Thomas is a 52 y.o. with a history of metastatic cervical cancer and extrinsic compression of the right ureter causing right-sided hydronephrosis.  She is status post right JJ stent placement on 10/24/2018 with Dr. Louis Meckel.  She had a left retrograde pyelogram at that time that showed very mild left-sided hydronephrosis that did not warrant a stent.  She presented to the [facility] emergency department on 10/26/2018 with lower back pain and was found to have a presacral abscess on CT as well as left-sided hydronephrosis.  She currently denies flank pain, dysuria or hematuria.  Her serum creatinine on 10/09/2018 was 2.8 and has since improved to 1.6 since her right JJ stent was placed in her urine output since being in the hospital is been appropriate.  Her baseline creatinine is around 1.3   Past Medical History:  Diagnosis Date  . Acquired pancytopenia ([place]) 03/2018  . Anemia    Mild  . Cancer associated pain   . Cervical cancer [facility]) oncologist-  dr gorsuch/  dr Sondra Come   dx 01-04-2017-- Stage IIIB,  cercial adenocarcinoma w/ local invasion perirectal area-- chemo started 02-09-2018 and external beam radiation completed 03/16/2018 ,  started brachytherapy boost high dose radiation 03-20-2018  . Chemotherapy induced nausea and vomiting   . Diabetes mellitus type 2, diet-controlled ([place])    pt. denies No meds  . Frequency of urination   . History of cellulitis 2018   bilateral lower leg w/ mrsa  . History of external beam radiation therapy 02-05-2018  to 03-16-2018   cervical cancer  . History of MRSA infection 2018   w/ bilateral lower leg cellulitis  . HIV (human immunodeficiency virus infection) ([place])    asymptomatic  . Hypomagnesemia 03/2018   Severe---- takes oral magnesium and IV magnesium as needed (cancer center)  . Intermittent diarrhea     due to radiation/ chemo  . Nocturia   . Port-A-Cath in place    Power port  . Radiation burn    LOWER ABD.  04-06-2018  per is healing  . Subclinical hypothyroidism    w/ thyroiditis , dx 01-22-2018 PET scan  . Thrombocytopenia ([place])   . Wears glasses     Past Surgical History:  Procedure Laterality Date  . CYSTOSCOPY     retrograde pyelogram right ureteral stent placement  . CYSTOSCOPY W/ URETERAL STENT PLACEMENT Bilateral 10/24/2018   Procedure: CYSTOSCOPY WITH BILATERAL RETROGRADE PYELOGRAM RIGHT Wyvonnia Dusky STENT PLACEMENT;  Surgeon: Ardis Hughs, MD;  Location: [facility];  Service: Urology;  Laterality: Bilateral;  . EUA/ CERVICAL BX  01-04-2018   dr Ihor Dow  [facility]  . IR FLUORO GUIDE PORT INSERTION RIGHT  02/01/2018  . TANDEM RING INSERTION N/A 03/20/2018   Procedure: TANDEM RING INSERTION;  Surgeon: Gery Pray, MD;  Location: [facility];  Service: Urology;  Laterality: N/A;  . TANDEM RING INSERTION N/A 03/26/2018   Procedure: TANDEM RING INSERTION;  Surgeon: Gery Pray, MD;  Location: [facility];  Service: Urology;  Laterality: N/A;  . TANDEM RING INSERTION N/A 04/04/2018   Procedure: TANDEM RING INSERTION;  Surgeon: Gery Pray, MD;  Location: [facility];  Service: Urology;  Laterality: N/A;  . TANDEM RING INSERTION N/A 04/12/2018   Procedure: TANDEM RING INSERTION;  Surgeon: Gery Pray, MD;  Location: [facility];  Service: Urology;  Laterality: N/A;  .  TANDEM RING INSERTION N/A 04/18/2018   Procedure: TANDEM RING INSERTION;  Surgeon: Gery Pray, MD;  Location: [facility];  Service: Urology;  Laterality: N/A;  . TUBAL LIGATION Bilateral 1990s    Current Hospital Medications:  Home Meds:  Current Meds  Medication Sig  . acetaminophen (TYLENOL) 500 MG tablet Take 1,000 mg by mouth every 6 (six) hours as needed for moderate pain or headache.   Marland Kitchen BIKTARVY 50-200-25 MG TABS tablet  TAKE 1 TABLET BY MOUTH DAILY.  Marland Kitchen dexamethasone (DECADRON) 4 MG tablet Take 5 tabs at the night before and 5 tab the morning of chemotherapy, every 3 weeks, by mouth  . levothyroxine (SYNTHROID, LEVOTHROID) 25 MCG tablet Take 1 tablet (25 mcg total) by mouth daily before breakfast.  . lidocaine-prilocaine (EMLA) cream Apply to affected area once  . magnesium oxide (MAG-OX) 400 (241.3 Mg) MG tablet Take 1 tablet (400 mg total) by mouth 2 (two) times daily.  Marland Kitchen morphine (MS CONTIN) 60 MG 12 hr tablet Take 1 tablet (60 mg total) by mouth every 12 (twelve) hours.  Marland Kitchen morphine (MSIR) 30 MG tablet Take 1 tablet (30 mg total) by mouth every 6 (six) hours as needed for severe pain.  . phenazopyridine (PYRIDIUM) 200 MG tablet Take 1 tablet (200 mg total) by mouth 3 (three) times daily as needed for pain.  . polyethylene glycol (MIRALAX / GLYCOLAX) packet Take 17 g by mouth daily.    Scheduled Meds: . bictegravir-emtricitabine-tenofovir AF  1 tablet Oral Daily  . levothyroxine  25 mcg Oral Q0600  . magnesium oxide  400 mg Oral BID  . morphine  60 mg Oral Q12H  . polyethylene glycol  17 g Oral Daily  . sodium chloride flush  10-40 mL Intracatheter Q12H   Continuous Infusions: . sodium chloride 100 mL/hr at 10/27/18 0635  . ceFEPime (MAXIPIME) IV 1 g (10/27/18 0934)  . metronidazole 500 mg (10/27/18 0636)   PRN Meds:.acetaminophen **OR** acetaminophen, morphine, ondansetron **OR** ondansetron (ZOFRAN) IV, phenazopyridine, sodium chloride flush  Allergies: No Known Allergies  Family History  Problem Relation Age of Onset  . Cancer Maternal Grandmother        unknown ca    Social History:  reports that she has never smoked. She has never used smokeless tobacco. She reports previous alcohol use. She reports that she does not use drugs.  ROS: A complete review of systems was performed.  All systems are negative except for pertinent findings as noted.  Physical Exam:  Vital signs in last 24  hours: Temp:  [97.8 F (36.6 C)-98.3 F (36.8 C)] 98.3 F (36.8 C) (12/21 0640) Pulse Rate:  [51-71] 68 (12/21 0640) Resp:  [14-18] 14 (12/21 0640) BP: (111-178)/(60-85) 121/69 (12/21 0640) SpO2:  [91 %-100 %] 97 % (12/21 0640) Weight:  [86 kg] 86 kg (12/20 2350) Constitutional:  Alert and oriented, No acute distress Cardiovascular: Regular rate and rhythm, No JVD Respiratory: Normal respiratory effort, Lungs clear bilaterally GI: Abdomen is soft, nontender, nondistended, no abdominal masses GU: No CVA tenderness Lymphatic: No lymphadenopathy Neurologic: Grossly intact, no focal deficits Psychiatric: Normal mood and affect  Laboratory Data:  Recent Labs    10/25/18 1159 10/26/18 1702  WBC 16.6* 13.7*  HGB 7.4* 7.6*  HCT 23.6* 25.0*  PLT 799* 683*    Recent Labs    10/25/18 1159 10/26/18 1702  NA 136 134*  K 4.5 4.4  CL 106 108  GLUCOSE 120* 179*  BUN 37* 36*  CALCIUM  9.8 8.4*  CREATININE 2.19* 1.66*     Results for orders placed or performed during the hospital encounter of 10/26/18 (from the past 24 hour(s))  CBC with Differential     Status: Abnormal   Collection Time: 10/26/18  5:02 PM  Result Value Ref Range   WBC 13.7 (H) 4.0 - 10.5 K/uL   RBC 2.59 (L) 3.87 - 5.11 MIL/uL   Hemoglobin 7.6 (L) 12.0 - 15.0 g/dL   HCT 25.0 (L) 36.0 - 46.0 %   MCV 96.5 80.0 - 100.0 fL   MCH 29.3 26.0 - 34.0 pg   MCHC 30.4 30.0 - 36.0 g/dL   RDW 17.0 (H) 11.5 - 15.5 %   Platelets 683 (H) 150 - 400 K/uL   nRBC 0.0 0.0 - 0.2 %   Neutrophils Relative % 94 %   Neutro Abs 12.9 (H) 1.7 - 7.7 K/uL   Lymphocytes Relative 3 %   Lymphs Abs 0.4 (L) 0.7 - 4.0 K/uL   Monocytes Relative 1 %   Monocytes Absolute 0.1 0.1 - 1.0 K/uL   Eosinophils Relative 0 %   Eosinophils Absolute 0.0 0.0 - 0.5 K/uL   Basophils Relative 0 %   Basophils Absolute 0.0 0.0 - 0.1 K/uL   Immature Granulocytes 2 %   Abs Immature Granulocytes 0.30 (H) 0.00 - 0.07 K/uL  Comprehensive metabolic panel      Status: Abnormal   Collection Time: 10/26/18  5:02 PM  Result Value Ref Range   Sodium 134 (L) 135 - 145 mmol/L   Potassium 4.4 3.5 - 5.1 mmol/L   Chloride 108 98 - 111 mmol/L   CO2 16 (L) 22 - 32 mmol/L   Glucose, Bld 179 (H) 70 - 99 mg/dL   BUN 36 (H) 6 - 20 mg/dL   Creatinine, Ser 1.66 (H) 0.44 - 1.00 mg/dL   Calcium 8.4 (L) 8.9 - 10.3 mg/dL   Total Protein 8.3 (H) 6.5 - 8.1 g/dL   Albumin 3.0 (L) 3.5 - 5.0 g/dL   AST 16 15 - 41 U/L   ALT 9 0 - 44 U/L   Alkaline Phosphatase 77 38 - 126 U/L   Total Bilirubin 0.6 0.3 - 1.2 mg/dL   GFR calc non Af Amer 35 (L) >60 mL/min   GFR calc Af Amer 41 (L) >60 mL/min   Anion gap 10 5 - 15  Lipase, blood     Status: None   Collection Time: 10/26/18  5:02 PM  Result Value Ref Range   Lipase 33 11 - 51 U/L  Urinalysis, Routine w reflex microscopic     Status: Abnormal   Collection Time: 10/26/18  5:02 PM  Result Value Ref Range   Color, Urine YELLOW YELLOW   APPearance HAZY (A) CLEAR   Specific Gravity, Urine 1.019 1.005 - 1.030   pH 6.0 5.0 - 8.0   Glucose, UA NEGATIVE NEGATIVE mg/dL   Hgb urine dipstick LARGE (A) NEGATIVE   Bilirubin Urine NEGATIVE NEGATIVE   Ketones, ur NEGATIVE NEGATIVE mg/dL   Protein, ur 30 (A) NEGATIVE mg/dL   Nitrite NEGATIVE NEGATIVE   Leukocytes, UA LARGE (A) NEGATIVE   RBC / HPF >50 (H) 0 - 5 RBC/hpf   WBC, UA >50 (H) 0 - 5 WBC/hpf   Bacteria, UA RARE (A) NONE SEEN   Squamous Epithelial / LPF 0-5 0 - 5   WBC Clumps PRESENT   Sedimentation rate     Status: Abnormal   Collection Time: 10/27/18 12:14  AM  Result Value Ref Range   Sed Rate 135 (H) 0 - 22 mm/hr  C-reactive protein     Status: Abnormal   Collection Time: 10/27/18 12:14 AM  Result Value Ref Range   CRP 9.3 (H) <1.0 mg/dL  Prepare RBC     Status: None   Collection Time: 10/27/18 12:14 AM  Result Value Ref Range   Order Confirmation      ORDER PROCESSED BY BLOOD BANK Performed at [facility] [address]  [place], [place] [address]   ABO/Rh     Status: None (Preliminary result)   Collection Time: 10/27/18 12:27 AM  Result Value Ref Range   ABO/RH(D)      Jenetta Downer NEG Performed at [facility], [place] [address] [place], [place] [address]   Type and screen [place]     Status: None (Preliminary result)   Collection Time: 10/27/18 12:31 AM  Result Value Ref Range   ABO/RH(D) O NEG    Antibody Screen NEG    Sample Expiration 10/30/2018    Unit Number [id number]    Blood Component Type RED CELLS,LR    Unit division 00    Status of Unit ISSUED    Transfusion Status OK TO TRANSFUSE    Crossmatch Result      Compatible Performed at [facility], [place] [address] [place], [place] [address]    No results found for this or any previous visit (from the past 240 hour(s)).  Renal Function: Recent Labs    10/23/18 1019 10/25/18 1159 10/26/18 1702  CREATININE 2.18* 2.19* 1.66*   Estimated Creatinine Clearance: 39.5 mL/min (A) (by C-G formula based on SCr of 1.66 mg/dL (H)).  Radiologic Imaging: Ct Head Wo Contrast  Result Date: 10/26/2018 CLINICAL DATA:  Altered consciousness. History of cervical cancer. EXAM: CT HEAD WITHOUT CONTRAST TECHNIQUE: Contiguous axial images were obtained from the base of the skull through the vertex without intravenous contrast. COMPARISON:  06/19/2013 FINDINGS: Brain: No mass lesion, hemorrhage, hydrocephalus, acute infarct, intra-axial, or extra-axial fluid collection. Vascular: No hyperdense vessel or unexpected calcification. Skull: Normal Sinuses/Orbits: Normal imaged portions of the orbits and globes. Clear paranasal sinuses and mastoid air cells. Other: None. IMPRESSION: Normal head CT. Electronically Signed   By: Abigail Miyamoto M.D.   On: 10/26/2018 23:54   Ct Abdomen Pelvis W Contrast  Result Date: 10/26/2018 CLINICAL DATA:  Low back pain radiating to the back and left flank. Recent right ureteral stent  placement. EXAM: CT ABDOMEN AND PELVIS WITH CONTRAST TECHNIQUE: Multidetector CT imaging of the abdomen and pelvis was performed using the standard protocol following bolus administration of intravenous contrast. CONTRAST:  33mL ISOVUE-300 IOPAMIDOL (ISOVUE-300) INJECTION 61% COMPARISON:  C-arm radiographs dated 10/24/2018. Abdomen and pelvis CT dated 01/03/2018. FINDINGS: Lower chest: Single small right lower lobe bullous. Hepatobiliary: Mild intrahepatic biliary ductal dilatation. Borderline dilated common duct. No obstructing stone or mass visualized. Normal appearing gallbladder. Focal fat deposition in the medial segment of the left lobe of the liver adjacent to the falciform ligament. Pancreas: Unremarkable. No pancreatic ductal dilatation or surrounding inflammatory changes. Spleen: Normal in size without focal abnormality. Adrenals/Urinary Tract: Right ureteral stent in place. The distal portion is just beyond the ureterovesical junction in the posterior aspect of the urinary bladder on the right. The proximal portion is on the upper pole collecting system. Interval moderate dilatation of the right renal collecting system and proximal ureter. No visible ureteral calculi. Large number  of bilateral pelvic phleboliths. Previously demonstrated left renal cyst. Interval mild-to-moderate dilatation of the left renal collecting system without ureteral dilatation. No left ureteral calculi seen. Small amount of air in the urinary bladder. Stomach/Bowel: Stomach is within normal limits. Appendix appears normal. No evidence of bowel wall thickening, distention, or inflammatory changes. Vascular/Lymphatic: No significant vascular findings are present. No enlarged abdominal or pelvic lymph nodes. Reproductive: Uterus and bilateral adnexa are unremarkable. Other: A recurrent or residual fluid and gas collection with thin surrounding enhancing walls cysts demonstrated in the left pararectal region. On image number 64  series 2, this measures 3.9 x 2.5 cm. On sagittal image number 74, this measures 9.4 cm in length and 4.2 cm in AP diameter, including the presacral region. On coronal image number 76, this measures 2.8 cm in width. It is difficult to distinguish the rectum separate from this collection. Musculoskeletal: Lumbar and lower thoracic spine degenerative changes. IMPRESSION: 1. 9.4 x 4.2 x 2.8 cm recurrent or residual fluid and gas collection in the left perirectal and presacral region, as described above. This has features highly suspicious for a perirectal and presacral abscess. 2. Right ureteral stent in place with interval moderate right hydronephrosis and proximal hydroureter. 3. Interval mild-to-moderate left hydronephrosis without ureteral dilatation compatible interval UPJ obstruction. 4. Mild intrahepatic biliary ductal dilatation. This could be due to a nonvisualized common duct stone or stricture. Electronically Signed   By: Claudie Revering M.D.   On: 10/26/2018 19:07    I independently reviewed the above imaging studies.  Impression/Recommendation 52 year old female with a history of metastatic cervical cancer with right greater than left hydronephrosis and chronic kidney disease.  She is status post right JJ stent placement on 10/24/2018.  She also has a history of HIV and has a possible urinary tract infection as well as a perirectal abscess  -No acute need for left JJ stent placement at this time unless her renal function were to deteriorate or if her hydronephrosis.  Will plan for OP f/u with [facility].  -Urine culture and sensitivity pending.  She is currently on IV cefepime and Flagyl for treatment of both a suspected UTI and her perirectal abscess  Ellison Hughs, MD [facility] 10/27/2018, 10:23 AM

## 2018-10-28 DIAGNOSIS — G893 Neoplasm related pain (acute) (chronic): Secondary | ICD-10-CM

## 2018-10-28 DIAGNOSIS — D649 Anemia, unspecified: Secondary | ICD-10-CM

## 2018-10-28 LAB — TYPE AND SCREEN
ABO/RH(D): O NEG
ANTIBODY SCREEN: NEGATIVE
Unit division: 0

## 2018-10-28 LAB — BLOOD GAS, VENOUS
Acid-base deficit: 6.9 mmol/L — ABNORMAL HIGH (ref 0.0–2.0)
Bicarbonate: 18.3 mmol/L — ABNORMAL LOW (ref 20.0–28.0)
Drawn by: 51425
FIO2: 21
O2 Saturation: 73.9 %
Patient temperature: 98.8
pCO2, Ven: 38 mmHg — ABNORMAL LOW (ref 44.0–60.0)
pH, Ven: 7.305 (ref 7.250–7.430)
pO2, Ven: 43 mmHg (ref 32.0–45.0)

## 2018-10-28 LAB — CBC
HCT: 30.8 % — ABNORMAL LOW (ref 36.0–46.0)
Hemoglobin: 9.6 g/dL — ABNORMAL LOW (ref 12.0–15.0)
MCH: 29.4 pg (ref 26.0–34.0)
MCHC: 31.2 g/dL (ref 30.0–36.0)
MCV: 94.5 fL (ref 80.0–100.0)
NRBC: 0.2 % (ref 0.0–0.2)
Platelets: 616 10*3/uL — ABNORMAL HIGH (ref 150–400)
RBC: 3.26 MIL/uL — ABNORMAL LOW (ref 3.87–5.11)
RDW: 16.6 % — ABNORMAL HIGH (ref 11.5–15.5)
WBC: 12.3 10*3/uL — ABNORMAL HIGH (ref 4.0–10.5)

## 2018-10-28 LAB — COMPREHENSIVE METABOLIC PANEL
ALT: 11 U/L (ref 0–44)
AST: 16 U/L (ref 15–41)
Albumin: 2.8 g/dL — ABNORMAL LOW (ref 3.5–5.0)
Alkaline Phosphatase: 55 U/L (ref 38–126)
Anion gap: 9 (ref 5–15)
BUN: 40 mg/dL — ABNORMAL HIGH (ref 6–20)
CO2: 17 mmol/L — ABNORMAL LOW (ref 22–32)
Calcium: 8.4 mg/dL — ABNORMAL LOW (ref 8.9–10.3)
Chloride: 110 mmol/L (ref 98–111)
Creatinine, Ser: 1.56 mg/dL — ABNORMAL HIGH (ref 0.44–1.00)
GFR calc Af Amer: 44 mL/min — ABNORMAL LOW (ref >60)
GFR calc non Af Amer: 38 mL/min — ABNORMAL LOW (ref >60)
Glucose, Bld: 153 mg/dL — ABNORMAL HIGH (ref 70–99)
Potassium: 4.4 mmol/L (ref 3.5–5.1)
Sodium: 136 mmol/L (ref 135–145)
Total Bilirubin: 0.8 mg/dL (ref 0.3–1.2)
Total Protein: 7 g/dL (ref 6.5–8.1)

## 2018-10-28 LAB — BPAM RBC
Blood Product Expiration Date: 202001172359
ISSUE DATE / TIME: 201912210414
Unit Type and Rh: 9500

## 2018-10-28 LAB — FOLATE: Folate: 4.2 ng/mL — ABNORMAL LOW (ref >5.9)

## 2018-10-28 LAB — MAGNESIUM: Magnesium: 2.3 mg/dL (ref 1.7–2.4)

## 2018-10-28 LAB — VITAMIN B12: Vitamin B-12: 259 pg/mL (ref 180–914)

## 2018-10-28 MED ORDER — HEPARIN SODIUM (PORCINE) 5000 UNIT/ML IJ SOLN
5000.0000 [IU] | Freq: Three times a day (TID) | INTRAMUSCULAR | Status: DC
  Administered 2018-10-28 – 2018-10-29 (×4): 5000 [IU] via SUBCUTANEOUS
  Filled 2018-10-28 (×3): qty 1

## 2018-10-28 MED ORDER — SENNA 8.6 MG PO TABS
2.0000 | ORAL_TABLET | Freq: Once | ORAL | Status: AC
  Administered 2018-10-29: 17.2 mg via ORAL
  Filled 2018-10-28 (×2): qty 2

## 2018-10-28 NOTE — Progress Notes (Signed)
   10/28/18 0700  What Happened  Was fall witnessed? No  Was patient injured? No  Patient found on floor  Found by Staff-comment Elwin Sleight RN)  Stated prior activity bathroom-unassisted  Follow Up  MD notified Reesa Chew  Time MD notified 69  Family notified No- patient refusal  Additional tests No  Progress note created (see row info) Yes  Adult Fall Risk Assessment  Risk Factor Category (scoring not indicated) Fall has occurred during this admission (document High fall risk)  Patient Fall Risk Level High fall risk  Adult Fall Risk Interventions  Required Bundle Interventions *See Row Information* High fall risk - low, moderate, and high requirements implemented  Vitals  Temp 97.9 F (36.6 C)  Temp Source Oral  BP 120/80  BP Location Right Arm  BP Method Automatic  Patient Position (if appropriate) Sitting  Pulse Rate 68  Pulse Rate Source Dinamap  Resp 14  Oxygen Therapy  SpO2 100 %  O2 Device Room Air  Pain Assessment  Pain Score 0  Neurological  Neuro (WDL) WDL  Level of Consciousness Alert  Orientation Level Oriented X4  Cognition Appropriate at baseline  Speech Clear  Motor Function/Sensation Assessment Grip  R Hand Grip Present;Strong  L Hand Grip Present;Strong  Neuro Symptoms Forgetful  Musculoskeletal  Musculoskeletal (WDL) X  Assistive Device None  Generalized Weakness Yes  Integumentary  Integumentary (WDL) X  Skin Color Appropriate for ethnicity  Skin Condition Dry  Skin Integrity Other (Comment)

## 2018-10-28 NOTE — Progress Notes (Signed)
1418/1418-01  Past Medical History:  Diagnosis Date  . Acquired pancytopenia ([place]) 03/2018  . Anemia    Mild  . Cancer associated pain   . Cervical cancer [facility]) oncologist-  dr gorsuch/  dr Sondra Come   dx 01-04-2017-- Stage IIIB,  cercial adenocarcinoma w/ local invasion perirectal area-- chemo started 02-09-2018 and external beam radiation completed 03/16/2018 ,  started brachytherapy boost high dose radiation 03-20-2018  . Chemotherapy induced nausea and vomiting   . Diabetes mellitus type 2, diet-controlled ([place])    pt. denies No meds  . Frequency of urination   . History of cellulitis 2018   bilateral lower leg w/ mrsa  . History of external beam radiation therapy 02-05-2018  to 03-16-2018   cervical cancer  . History of MRSA infection 2018   w/ bilateral lower leg cellulitis  . HIV (human immunodeficiency virus infection) ([place])    asymptomatic  . Hypomagnesemia 03/2018   Severe---- takes oral magnesium and IV magnesium as needed (cancer center)  . Intermittent diarrhea    due to radiation/ chemo  . Nocturia   . Port-A-Cath in place    Power port  . Radiation burn    LOWER ABD.  04-06-2018  per is healing  . Subclinical hypothyroidism    w/ thyroiditis , dx 01-22-2018 PET scan  . Thrombocytopenia ([place])   . Wears glasses      Subjective: Patient reports still no BM. Denies N/V.  tolerating PO.  Pain moderately controlled  Objective: Vital signs in last 24 hours: Temp:  [97.5 F (36.4 C)-98.8 F (37.1 C)] 98 F (36.7 C) (12/22 0927) Pulse Rate:  [51-68] 68 (12/22 0927) Resp:  [12-19] 19 (12/22 0927) BP: (120-147)/(79-89) 121/82 (12/22 0927) SpO2:  [97 %-100 %] 97 % (12/22 0927) Last BM Date: (PTA per pt report )  Intake/Output from previous day: 12/21 0701 - 12/22 0700 In: 2534.7 [P.O.:240; I.V.:1794.7; IV Piggyback:500] Out: -  Patient Vitals for the past 24 hrs:  Urine Occurrence  10/28/18 1019 1  10/28/18 0900 1     Physical Examination: Pelvic  exam - brown vaginal drainage, suspect feculant. On bimanual exam tumor burden palpated in pouch of Douglas and posterior vagina with suspected tumor. Difficult exam in bed. On rectovaginal there is a communication between the rectum and the vagina. There is large tumor burden in the parametrium/deep posterior pelvis.   Labs: WBC/Hgb/Hct/Plts:  12.3/9.6/30.8/616 (12/22 0420) BUN/Cr/glu/ALT/AST/amyl/lip:  40/1.56/--/11/16/--/-- (12/22 0420)  CBC Latest Ref Rng & Units 10/28/2018 10/27/2018 10/26/2018  WBC 4.0 - 10.5 K/uL 12.3(H) 14.0(H) 13.7(H)  Hemoglobin 12.0 - 15.0 g/dL 9.6(L) 9.6(L) 7.6(L)  Hematocrit 36.0 - 46.0 % 30.8(L) 30.5(L) 25.0(L)  Platelets 150 - 400 K/uL 616(H) 766(H) 683(H)   BMP Latest Ref Rng & Units 10/28/2018 10/27/2018 10/26/2018  Glucose 70 - 99 mg/dL 153(H) 168(H) 179(H)  BUN 6 - 20 mg/dL 40(H) 42(H) 36(H)  Creatinine 0.44 - 1.00 mg/dL 1.56(H) 1.64(H) 1.66(H)  BUN/Creat Ratio 6 - 22 (calc) - - -  Sodium 135 - 145 mmol/L 136 137 134(L)  Potassium 3.5 - 5.1 mmol/L 4.4 4.3 4.4  Chloride 98 - 111 mmol/L 110 110 108  CO2 22 - 32 mmol/L 17(L) 18(L) 16(L)  Calcium 8.9 - 10.3 mg/dL 8.4(L) 8.1(L) 8.4(L)      Assessment/Plan:  52 y.o.  H/o advanced cervical CA now with progression/early recurrence. 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   Pain:  Pain is likely due to disease  progression, tumor necrosis, and no BM for several days. Consider palliative care consult for optimization of outpatient regimen.  Neuro: This is my first time meeting her. She is not a very good historian, but mostly answers direct questions logically.   Pulm: no issues  Heme: Stable  CV:  Per primary team  Prophylaxis: OK from my standpoint for Lovenox or heparin while admitted. I do not recommend any surgical intervention or IR at this point.   GI:    . Tolerating po: Yes    . 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 We discussed this is not something that we would surgically address at this time. We could offer palliative colostomy to prevent stool leakage, however I would wait to see if she responds to the chemo she just started before offering. . Constipation suspect multifactorial; starting new narcotic regimen last week plus tumor volume increase in the pelvis. Continue Miralax, milk of mag (as long as creat OK), and Colace  GU: . Per urology possible outpatient stent for contralateral side.  FEN:  . Per primary  Endo:  Apparent diet controlled diabetes. Per Primary  ID: Low suspicion for abcess however wbc coming down with antibiotics. I think it's reasonable to continue for now.   Gyn/Oncology: Will discuss with Dr. Alvy Bimler tomorrow so she is aware of patient. Agree with plan thus far. She has only had one cycle of chemo so we do not yet know how well she is responding. PDL1 studies are being requested/run and that will determine if immunotherapy an option for her.  I briefly re-reviewed the concept of palliative intent treatment and the poor prognosis associated with her case specifically.     LOS: 2 days    Isabel Caprice 10/28/2018, 11:52 AM

## 2018-10-28 NOTE — Progress Notes (Signed)
PROGRESS NOTE    Misty Thomas  HDQ:[id number] DOB: August 18, 1966 DOA: 10/26/2018 PCP: Patient, No Pcp Per   Brief Narrative:  52 year old with history of HIV, cervical cancer on chemotherapy, diabetes mellitus type 2, anemia came to the hospital with lower back pain.  She notes having back pain for some time even prior to her recent chest cystostomy with stent placement for right-sided hydronephrosis and hydroureter.  Admitted for concerns for rectal abscess, progression of her poorly differentiated cervical carcinoma and possible tumor necrosis.   Assessment & Plan:   Principal Problem:   Perirectal abscess Active Problems:   Cervical cancer ([place])   Diabetes mellitus without complication (HCC)   Anemia, blood loss   HIV (human immunodeficiency virus infection) (HCC)   Hydronephrosis   Back pain   Rectal pain concerning for rectal abscess Poorly differentiated stage III cervical carcinoma with recurrence - Overall appears to have poor prognosis.  Oncology team to follow the patient.  Appreciate input from GYN oncology. - We will consult palliative care -Supportive care, pain control - Broad-spectrum antibiotic-cefepime and Flagyl for now -Pain control, bowel regimen  Hypothyroidism - Continue Synthroid 25 mcg daily  HIV -Continue home medication.  Last CD4 count August 2000 19-130  Diabetes mellitus type 2 -Diet controlled.  Hemoglobin A1c 5.512/2019   DVT prophylaxis: SCDs; start subcutaneous heparin Code Status: Full code Family Communication: None Disposition Plan: Maintain inpatient stay until further improvement in her rectal pain.  Consultants:   Urology  GYN/oncology  Oncology   Antimicrobials:   Cefepime and Flagyl day 3   Subjective: Patient reports of feeling some rectal pain off and on.  Tolerating oral diet.  She appears very worried due to concerns for progression of her malignancy.  Review of Systems Otherwise negative except as per HPI,  including: General: Denies fever, chills, night sweats or unintended weight loss. Resp: Denies cough, wheezing, shortness of breath. Cardiac: Denies chest pain, palpitations, orthopnea, paroxysmal nocturnal dyspnea. GI: Denies abdominal pain, nausea, vomiting, diarrhea or constipation GU: Denies dysuria, frequency, hesitancy or incontinence MS: Denies muscle aches, joint pain or swelling Neuro: Denies headache, neurologic deficits (focal weakness, numbness, tingling), abnormal gait Psych: Denies anxiety, depression, SI/HI/AVH Skin: Denies new rashes or lesions ID: Denies sick contacts, exotic exposures, travel  Objective: Vitals:   10/28/18 0541 10/28/18 0700 10/28/18 0927 10/28/18 1324  BP: 130/83 120/80 121/82 (!) 139/94  Pulse: 61 68 68 95  Resp: 12 14 19 18   Temp: 97.8 F (36.6 C) 97.9 F (36.6 C) 98 F (36.7 C) 98.5 F (36.9 C)  TempSrc: Oral Oral Oral Oral  SpO2: 99% 100% 97% 99%  Weight:      Height:        Intake/Output Summary (Last 24 hours) at 10/28/2018 1515 Last data filed at 10/28/2018 1500 Gross per 24 hour  Intake 2578.93 ml  Output 400 ml  Net 2178.93 ml   Filed Weights   10/26/18 2350  Weight: 86 kg    Examination:  General exam: Appears calm and comfortable  Respiratory system: Clear to auscultation. Respiratory effort normal. Cardiovascular system: S1 & S2 heard, RRR. No JVD, murmurs, rubs, gallops or clicks. No pedal edema. Gastrointestinal system: Abdomen is nondistended, soft and nontender. No organomegaly or masses felt. Normal bowel sounds heard. Central nervous system: Alert and oriented. No focal neurological deficits. Extremities: Symmetric 5 x 5 power. Skin: No rashes, lesions or ulcers Psychiatry: Judgement and insight appear normal. Mood & affect appropriate.     Data  Reviewed:   CBC: Recent Labs  Lab 10/23/18 1019 10/25/18 1159 10/26/18 1702 10/27/18 0932 10/28/18 0420  WBC 15.4* 16.6* 13.7* 14.0* 12.3*  NEUTROABS  --   15.0* 12.9*  --   --   HGB 8.5* 7.4* 7.6* 9.6* 9.6*  HCT 27.7* 23.6* 25.0* 30.5* 30.8*  MCV 98.2 94.8 96.5 94.7 94.5  PLT 765* 799* 683* 766* 329*   Basic Metabolic Panel: Recent Labs  Lab 10/23/18 1019 10/25/18 1159 10/26/18 1702 10/27/18 0932 10/28/18 0420  NA 135 136 134* 137 136  K 4.1 4.5 4.4 4.3 4.4  CL 106 106 108 110 110  CO2 17* 18* 16* 18* 17*  GLUCOSE 111* 120* 179* 168* 153*  BUN 33* 37* 36* 42* 40*  CREATININE 2.18* 2.19* 1.66* 1.64* 1.56*  CALCIUM 9.7 9.8 8.4* 8.1* 8.4*  MG  --  2.0  --  2.2 2.3   GFR: Estimated Creatinine Clearance: 42 mL/min (A) (by C-G formula based on SCr of 1.56 mg/dL (H)). Liver Function Tests: Recent Labs  Lab 10/25/18 1159 10/26/18 1702 10/28/18 0420  AST 15 16 16   ALT 8 9 11   ALKPHOS 92 77 55  BILITOT 0.3 0.6 0.8  PROT 9.3* 8.3* 7.0  ALBUMIN 3.0* 3.0* 2.8*   Recent Labs  Lab 10/26/18 1702  LIPASE 33   Recent Labs  Lab 10/27/18 2053  AMMONIA 27   Coagulation Profile: No results for input(s): INR, PROTIME in the last 168 hours. Cardiac Enzymes: No results for input(s): CKTOTAL, CKMB, CKMBINDEX, TROPONINI in the last 168 hours. BNP (last 3 results) No results for input(s): PROBNP in the last 8760 hours. HbA1C: No results for input(s): HGBA1C in the last 72 hours. CBG: Recent Labs  Lab 10/24/18 0824 10/24/18 1224  GLUCAP 102* 92   Lipid Profile: No results for input(s): CHOL, HDL, LDLCALC, TRIG, CHOLHDL, LDLDIRECT in the last 72 hours. Thyroid Function Tests: Recent Labs    10/27/18 0932  TSH 6.721*   Anemia Panel: Recent Labs    10/28/18 0420  VITAMINB12 259  FOLATE 4.2*   Sepsis Labs: No results for input(s): PROCALCITON, LATICACIDVEN in the last 168 hours.  Recent Results (from the past 240 hour(s))  Urine culture     Status: Abnormal   Collection Time: 10/26/18  5:28 PM  Result Value Ref Range Status   Specimen Description   Final    URINE, RANDOM Performed at [facility] [address] [place], [place] [address]    Special Requests   Final    NONE Performed at [facility] [place] [address], [place], [place] [address]    Culture (A)  Final    <10,000 COLONIES/mL INSIGNIFICANT GROWTH Performed at [facility] [address], [place], [place] [address]    Report Status 10/27/2018 FINAL  Final  Culture, blood (routine x 2)     Status: None (Preliminary result)   Collection Time: 10/27/18 12:14 AM  Result Value Ref Range Status   Specimen Description   Final    BLOOD RIGHT ANTECUBITAL Performed at [facility] [address], [place], [place] [address]    Special Requests   Final    BOTTLES DRAWN AEROBIC ONLY Blood Culture adequate volume Performed at [facility] [address] [place], [place] [address]    Culture   Final    NO GROWTH < 24 HOURS Performed at [facility] [address], [place], [place] [address]  Report Status PENDING  Incomplete  Culture, blood (routine x 2)     Status: None (Preliminary result)   Collection Time: 10/27/18 12:14 AM  Result Value Ref Range Status   Specimen Description   Final    BLOOD LEFT ANTECUBITAL Performed at [facility] [address] [place], [place] [address]    Special Requests   Final    BOTTLES DRAWN AEROBIC AND ANAEROBIC Blood Culture adequate volume Performed at [facility] [address], [place], [place] [address]    Culture   Final    NO GROWTH < 24 HOURS Performed at [facility] [address] [place], [place] [address]    Report Status PENDING  Incomplete         Radiology Studies: Ct Head Wo Contrast  Result Date: 10/26/2018 CLINICAL DATA:  Altered consciousness. History of cervical cancer. EXAM: CT HEAD WITHOUT CONTRAST TECHNIQUE: Contiguous axial images were obtained from the base of the skull through the vertex without intravenous contrast.  COMPARISON:  06/19/2013 FINDINGS: Brain: No mass lesion, hemorrhage, hydrocephalus, acute infarct, intra-axial, or extra-axial fluid collection. Vascular: No hyperdense vessel or unexpected calcification. Skull: Normal Sinuses/Orbits: Normal imaged portions of the orbits and globes. Clear paranasal sinuses and mastoid air cells. Other: None. IMPRESSION: Normal head CT. Electronically Signed   By: Abigail Miyamoto M.D.   On: 10/26/2018 23:54   Ct Abdomen Pelvis W Contrast  Result Date: 10/26/2018 CLINICAL DATA:  Low back pain radiating to the back and left flank. Recent right ureteral stent placement. EXAM: CT ABDOMEN AND PELVIS WITH CONTRAST TECHNIQUE: Multidetector CT imaging of the abdomen and pelvis was performed using the standard protocol following bolus administration of intravenous contrast. CONTRAST:  25mL ISOVUE-300 IOPAMIDOL (ISOVUE-300) INJECTION 61% COMPARISON:  C-arm radiographs dated 10/24/2018. Abdomen and pelvis CT dated 01/03/2018. FINDINGS: Lower chest: Single small right lower lobe bullous. Hepatobiliary: Mild intrahepatic biliary ductal dilatation. Borderline dilated common duct. No obstructing stone or mass visualized. Normal appearing gallbladder. Focal fat deposition in the medial segment of the left lobe of the liver adjacent to the falciform ligament. Pancreas: Unremarkable. No pancreatic ductal dilatation or surrounding inflammatory changes. Spleen: Normal in size without focal abnormality. Adrenals/Urinary Tract: Right ureteral stent in place. The distal portion is just beyond the ureterovesical junction in the posterior aspect of the urinary bladder on the right. The proximal portion is on the upper pole collecting system. Interval moderate dilatation of the right renal collecting system and proximal ureter. No visible ureteral calculi. Large number of bilateral pelvic phleboliths. Previously demonstrated left renal cyst. Interval mild-to-moderate dilatation of the left renal collecting  system without ureteral dilatation. No left ureteral calculi seen. Small amount of air in the urinary bladder. Stomach/Bowel: Stomach is within normal limits. Appendix appears normal. No evidence of bowel wall thickening, distention, or inflammatory changes. Vascular/Lymphatic: No significant vascular findings are present. No enlarged abdominal or pelvic lymph nodes. Reproductive: Uterus and bilateral adnexa are unremarkable. Other: A recurrent or residual fluid and gas collection with thin surrounding enhancing walls cysts demonstrated in the left pararectal region. On image number 64 series 2, this measures 3.9 x 2.5 cm. On sagittal image number 74, this measures 9.4 cm in length and 4.2 cm in AP diameter, including the presacral region. On coronal image number 76, this measures 2.8 cm in width. It is difficult to distinguish the rectum separate from this collection. Musculoskeletal: Lumbar and lower thoracic spine degenerative changes. IMPRESSION: 1. 9.4 x 4.2  x 2.8 cm recurrent or residual fluid and gas collection in the left perirectal and presacral region, as described above. This has features highly suspicious for a perirectal and presacral abscess. 2. Right ureteral stent in place with interval moderate right hydronephrosis and proximal hydroureter. 3. Interval mild-to-moderate left hydronephrosis without ureteral dilatation compatible interval UPJ obstruction. 4. Mild intrahepatic biliary ductal dilatation. This could be due to a nonvisualized common duct stone or stricture. Electronically Signed   By: Claudie Revering M.D.   On: 10/26/2018 19:07        Scheduled Meds: . bictegravir-emtricitabine-tenofovir AF  1 tablet Oral Daily  . docusate sodium  100 mg Oral BID  . levothyroxine  25 mcg Oral Q0600  . magnesium hydroxide  15 mL Oral Daily  . magnesium oxide  400 mg Oral BID  . morphine  60 mg Oral Q12H  . polyethylene glycol  17 g Oral Daily  . sodium chloride flush  10-40 mL Intracatheter Q12H    Continuous Infusions: . ceFEPime (MAXIPIME) IV Stopped (10/28/18 8016)  . lactated ringers 100 mL/hr at 10/28/18 1500  . metronidazole Stopped (10/28/18 1445)     LOS: 2 days   Time spent= 40 mins    Barbarajean Kinzler Arsenio Loader, MD [facility] Pager [phone]   If 7PM-7AM, please contact night-coverage [email or web address] Password [facility] 10/28/2018, 3:15 PM

## 2018-10-29 ENCOUNTER — Other Ambulatory Visit: Payer: Self-pay | Admitting: Hematology and Oncology

## 2018-10-29 DIAGNOSIS — E038 Other specified hypothyroidism: Secondary | ICD-10-CM | POA: Diagnosis present

## 2018-10-29 DIAGNOSIS — Z95828 Presence of other vascular implants and grafts: Secondary | ICD-10-CM

## 2018-10-29 DIAGNOSIS — K611 Rectal abscess: Principal | ICD-10-CM

## 2018-10-29 DIAGNOSIS — N823 Fistula of vagina to large intestine: Secondary | ICD-10-CM

## 2018-10-29 DIAGNOSIS — Z923 Personal history of irradiation: Secondary | ICD-10-CM

## 2018-10-29 DIAGNOSIS — E039 Hypothyroidism, unspecified: Secondary | ICD-10-CM | POA: Diagnosis present

## 2018-10-29 DIAGNOSIS — Z515 Encounter for palliative care: Secondary | ICD-10-CM

## 2018-10-29 DIAGNOSIS — N739 Female pelvic inflammatory disease, unspecified: Secondary | ICD-10-CM

## 2018-10-29 LAB — RPR: RPR Ser Ql: NONREACTIVE

## 2018-10-29 MED ORDER — FOLIC ACID 1 MG PO TABS
1.0000 mg | ORAL_TABLET | Freq: Every day | ORAL | Status: DC
  Administered 2018-10-29 – 2018-11-09 (×12): 1 mg via ORAL
  Filled 2018-10-29 (×12): qty 1

## 2018-10-29 MED ORDER — HYDROMORPHONE HCL 1 MG/ML IJ SOLN
1.0000 mg | Freq: Once | INTRAMUSCULAR | Status: AC
  Administered 2018-10-29: 1 mg via INTRAVENOUS
  Filled 2018-10-29: qty 1

## 2018-10-29 MED ORDER — HEPARIN SODIUM (PORCINE) 5000 UNIT/ML IJ SOLN
5000.0000 [IU] | Freq: Three times a day (TID) | INTRAMUSCULAR | Status: DC
  Administered 2018-10-29 – 2018-11-02 (×12): 5000 [IU] via SUBCUTANEOUS
  Filled 2018-10-29 (×13): qty 1

## 2018-10-29 NOTE — Progress Notes (Signed)
Misty Thomas   DOB:1966/06/02   VP#:[id number]    Assessment & Plan:  Recurrent, locally advanced cervical cancer She has received first dose of chemotherapy on October 26, 2018. I expect her blood count will nadir at the end of the week Continue supportive care  Necrotic tumor with vaginal/rectal fistula, probable infection/abscess It is hard to interpret her imaging study but the patient likely have necrotic tumor complicated by fistula.  She has received broad-spectrum IV antibiotics I had extensive discussion with GYN oncologist and general surgery about the role of palliative, diversion colostomy. She would be at very high risk of complication with infection and probable wound healing given prior history of radiation, recent chemotherapy, background history of HIV infection and protein calorie malnutrition. For now, I favor conservative management with IV antibiotics and supportive care  HIV infection She will continue antiretroviral treatment  Severe pelvic pain Due to infection, disease and fistula She will continue to receive intermittent doses of pain medicine as needed  Altered mental status/confusion, resolved She appears somewhat sedated on exam Suspect it could be related to pain medicine.  Recent CT imaging showed no evidence of intracranial metastasis  Severe constipation Multifactorial, could be due to disease and side effects of medication Recommend aggressive laxative therapy  Chronic renal failure Due to hydronephrosis and disease She had stent placement recently.  Severe anemia Due to recent renal disease and chemotherapy.  Transfuse as needed  Moderate protein calorie malnutrition Due to her disease Nutritional supplement as needed  Goals of care discussion She has numerous discussions with surgical teams In my opinion, I would favor conservative management right now rather than diversion colostomy due to high risk of complication.  Discharge  planning Not ready for discharge. I will see her daily   [name], MD 10/29/2018  4:05 PM   Subjective:  Patient is well-known to me.  I was consulted by GYN oncology service and hospitalist for further management of this patient. I do saw her last week.  She received first dose of chemotherapy on October 26, 2018.  On October 25, 2018, I saw her in the presence of her sister.  At that time, her pain was well controlled. However, I was informed that she has worsening pain after chemotherapy leading to ER visit and ultimately admission to the hospital. Currently, her pain is well controlled.  She has significant constipation with no bowel movement for at least 4 to 5 days.  She denies nausea currently. Summary of oncologic history as follows:  Oncology History   PD-L 1 40%     Cervical cancer ([place])   01/04/2017 Initial Diagnosis    She went to urgent care service for evaluation of abnormal vaginal symptoms with discharge and was diagnosed with bacterial vaginosis    01/03/2018 Imaging    1. Complex thick-walled fluid collection/abscess the lower pelvis with possible communication to the lower uterine/vagina. Clinical correlation and further evaluation with pelvic ultrasound recommended. CT with IV and delayed oral and rectal contrast may provide additional information. A smaller collection is also noted in the left posterior hemipelvis the left of the rectum. 2. No bowel obstruction or active inflammation.  Normal appendix. Please note the described 5.0 x 5.5 cm complex collection within the pelvis appears to be in the region of the cervix and may represent a necrotic mass/neoplasm. The described 3 x 3 cm low attenuating lesion to the left of the rectum may represent an abnormal lymph node.     01/04/2018  Pathology Results    Cervix, biopsy - POORLY DIFFERENTIATED CARCINOMA - SEE COMMENT Microscopic Comment The biopsy material consists of multiple fragments of tissue with  infiltrative tumor and granulation tissue. By immunohistochemistry, the tumor is positive for cytokeratin 5/6, p63, p16 and vimentin (diffusely positive) but negative for ER. 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    01/04/2018 Imaging    Pelvic US 1. Heterogeneous area of irregularity with Doppler flow in the region of the cervix. A cervical mass is not excluded. Clinical correlation is recommended.  2. Rounded complex mass/collection posterior to the lower uterus/vagina corresponding to the larger complex collection seen on the earlier CT. This may represent a necrotic or purulent collection. 3. Grossly unremarkable left ovary. Nonvisualization the right ovary.    01/04/2018 Procedure    She underwent examination under anesthesia Procedure: pt taken to the operating room where gen anesthesia was performed. She was placed in the dorsal lithotomy position and prepped and draped in the usual sterile fashion. A bivalve speculum was placed in the pts vagina and a macerated cervix was noted. There was very little that resembled a normal cervix. The tissue was necrotic and malodorous.  Several biopsies were obtained. There was actually very little bleeding from the biopsy sites although there was bleeding from within the uterine cavity above the location of the cervix.       01/22/2018 PET scan    1. Large hypermetabolic cervical mass with local invasion into the perirectal space. Bilateral pelvic adenopathy noted with small but hypermetabolic pelvic lymph nodes compatible with malignancy. I do not see definite hypermetabolic adenopathy in the upper abdomen, but there are scattered small bilateral hypermetabolic lymph nodes in the neck and axillary regions of uncertain significance. Given the low level activity and the skipped region in the abdomen, it is possible that the neck and chest lymph nodes are simply reactive, as usually I would expect to see more  upper abdominal adenopathy were there involvement in the neck and chest. Surveillance is probably warranted. 2. Bilateral mild diffuse thyroid activity suggesting thyroiditis. 3. Other imaging findings of potential clinical significance: Aortic Atherosclerosis (ICD10-I70.0). Mild cardiomegaly. Bilateral iliac artery atherosclerosis. Suspected uterine fundal fibroid.     01/25/2018 Cancer Staging    Staging form: Cervix Uteri, AJCC 8th Edition - Clinical: FIGO Stage IVA (cT4, cN1, cM0) - Signed by [name], MD on 01/25/2018    02/01/2018 Procedure    Successful 8 French right internal jugular vein power port placement with its tip at the SVC/RA junction.    02/05/2018 - 04/18/2018 Radiation Therapy    Radiation treatment dates:   External therapy: 02/05/2018-03/16/2018, Brachytherapy: 03/20/2018, 03/26/2018, 04/04/2018, 04/12/2018, 04/18/2018  Site/dose:   1. Cervix/pelvis, 1.8 Gy in 25 fractions for a total dose of 45 Gy                      2. Sidewall/ parametrial Boost, 1.8 Gy in 5 fractions for a total dose of 9 Gy                      3. cervix, 5.5 Gy/fx,  5 fractions for a total dose of 27.5 Gy  Beams/energy:   1. 3D, 15X                              2. 3D, 15X  3. HDR, Ir-192, tandem ring system     02/09/2018 - 03/09/2018 Chemotherapy    The patient had weekly cisplatin    07/12/2018 PET scan    Significant response to therapy with decreased hypermetabolic cervical soft tissue mass, left perirectal/presacral soft tissue density, and bilateral iliac lymphadenopathy. No new or progressive metastatic disease identified.  Increased mild right hydronephrosis noted.  Stable thyroiditis, hepatic steatosis, and small uterine fibroid    10/09/2018 PET scan    1. Degradation secondary to patient body habitus. 2. Disease progression, as evidenced by increase in hypermetabolism within the cervix and a perirectal area of soft tissue density and gas. Progressive left common  iliac and new retroperitoneal abdominal hypermetabolism, suspicious for nodal metastasis. 3. Gas within the cervical region could be related to necrosis from radiation therapy. Fistulous communication to bowel could look similar. 4. Similar to mild increase in moderate right-sided hydronephrosis. 5. Thyroid hypermetabolism, again suggesting thyroiditis.    10/24/2018 Surgery    Procedure: 1. Cystoscopy, bilateral retrograde pyelogram with interpretation 2. Right ureteral stent placement   Surgeon: Ardis Hughs, MD  Intraoperative findings: #1:  Cystoscopy demonstrated some bullous edema within the bladder, but no discrete or obvious mucosal lesions.  The ureters were orthotopic. #2: Left retrograde pyelogram demonstrated some slight narrowing within the distal ureter, but there was no significant hydroureteronephrosis. #3: The right retrograde pyelogram demonstrated a dense 3 cm narrow stricture of the distal ureter and UVJ.  There was also a narrowing in the mid ureter just above the pelvic inlet that was approximately 2-1/2 cm long. #4: A 24 cm x 6 French Bard double-J inlay stent was placed in the patient's right ureter without complication. #5: An exam under anesthesia demonstrated a very hard and mobile and nodular lesion on the anterior rectal wall.     10/25/2018 -  Chemotherapy    The patient had carboplatin and Taxol    10/26/2018 Imaging    Ct abdomen and pelvis 1. 9.4 x 4.2 x 2.8 cm recurrent or residual fluid and gas collection in the left perirectal and presacral region, as described above. This has features highly suspicious for a perirectal and presacral abscess. 2. Right ureteral stent in place with interval moderate right hydronephrosis and proximal hydroureter. 3. Interval mild-to-moderate left hydronephrosis without ureteral dilatation compatible interval UPJ obstruction. 4. Mild intrahepatic biliary ductal dilatation. This could be due to a nonvisualized  common duct stone or stricture.     10/26/2018 Imaging    Ct head: Normal head CT      Objective:  Vitals:   10/29/18 0448 10/29/18 1326  BP: 120/81 118/77  Pulse: (!) 114 (!) 129  Resp: 19 18  Temp: 99.2 F (37.3 C) 98.6 F (37 C)  SpO2: 100% 100%     Intake/Output Summary (Last 24 hours) at 10/29/2018 1605 Last data filed at 10/29/2018 0327 Gross per 24 hour  Intake 838.13 ml  Output 200 ml  Net 638.13 ml    GENERAL:alert, no distress and comfortable.  She actually appears somewhat sleepy due to recent administration of pain medicine SKIN: skin color, texture, turgor are normal, no rashes or significant lesions EYES: normal, Conjunctiva are pink and non-injected, sclera clear OROPHARYNX:no exudate, no erythema and lips, buccal mucosa, and tongue normal  NECK: supple, thyroid normal size, non-tender, without nodularity LYMPH:  no palpable lymphadenopathy in the cervical, axillary or inguinal LUNGS: clear to auscultation and percussion with normal breathing effort HEART: regular rate & rhythm and no murmurs  and no lower extremity edema ABDOMEN:abdomen soft, non-tender and normal bowel sounds Musculoskeletal:no cyanosis of digits and no clubbing  NEURO: alert & oriented x 3 with fluent speech, no focal motor/sensory deficits   Labs:  Lab Results  Component Value Date   WBC 12.3 (H) 10/28/2018   HGB 9.6 (L) 10/28/2018   HCT 30.8 (L) 10/28/2018   MCV 94.5 10/28/2018   PLT 616 (H) 10/28/2018   NEUTROABS 12.9 (H) 10/26/2018    Lab Results  Component Value Date   NA 136 10/28/2018   K 4.4 10/28/2018   CL 110 10/28/2018   CO2 17 (L) 10/28/2018    Studies:  No results found.

## 2018-10-29 NOTE — Progress Notes (Signed)
PROGRESS NOTE    EMMAGENE ORTNER  QXI:[id number] DOB: Oct 03, 1966 DOA: 10/26/2018 PCP: Patient, No Pcp Per   Brief Narrative:  52 year old with history of HIV, cervical cancer on chemotherapy, diabetes mellitus type 2, anemia came to the hospital with lower back pain.  She notes having back pain for some time even prior to her recent chest cystostomy with stent placement for right-sided hydronephrosis and hydroureter.  Admitted for concerns for rectal abscess, progression of her poorly differentiated cervical carcinoma and possible tumor necrosis.   Assessment & Plan:   Principal Problem:   Perirectal abscess Active Problems:   Cervical cancer ([place])   Diabetes mellitus without complication (HCC)   Anemia, blood loss   HIV (human immunodeficiency virus infection) (HCC)   Hydronephrosis   Back pain   Rectal pain concerning for rectal abscess Poorly differentiated stage III cervical carcinoma with recurrence - Overall appears patient has poor prognosis, appreciate GYN oncology input, plan is that they are going to discuss the care so long-term plans can be made. - I have consulted palliative care team in the meantime -Supportive care, pain control - Broad-spectrum antibiotic-cefepime and Flagyl for now -Pain control, bowel regimen  Hypothyroidism - Continue Synthroid 25 mcg daily  HIV -Continue home medication.  Last CD4 count August 2000 19-130  Diabetes mellitus type 2 -Diet controlled.  Hemoglobin A1c 5.512/2019   DVT prophylaxis: Hep SQ Code Status: Full code Family Communication: None Disposition Plan: Maintain inpatient stay until cleared by Onc.   Consultants:   Urology  GYN/oncology  Oncology   Antimicrobials:   Cefepime and Flagyl day 4   Subjective: Reports of lower abd pain but no new complaints.   Review of Systems Otherwise negative except as per HPI, including: General = no fevers, chills, dizziness, malaise, fatigue HEENT/EYES = negative  for pain, redness, loss of vision, double vision, blurred vision, loss of hearing, sore throat, hoarseness, dysphagia Cardiovascular= negative for chest pain, palpitation, murmurs, lower extremity swelling Respiratory/lungs= negative for shortness of breath, cough, hemoptysis, wheezing, mucus production Gastrointestinal= negative for nausea, vomiting, melena, hematemesis Genitourinary= negative for Dysuria, Hematuria, Change in Urinary Frequency MSK = Negative for arthralgia, myalgias, Back Pain, Joint swelling  Neurology= Negative for headache, seizures, numbness, tingling  Psychiatry= Negative for anxiety, depression, suicidal and homocidal ideation Allergy/Immunology= Medication/Food allergy as listed  Skin= Negative for Rash, lesions, ulcers, itching  Objective: Vitals:   10/28/18 0927 10/28/18 1324 10/28/18 2005 10/29/18 0448  BP: 121/82 (!) 139/94 125/80 120/81  Pulse: 68 95 75 (!) 114  Resp: 19 18 18 19   Temp: 98 F (36.7 C) 98.5 F (36.9 C) 98.2 F (36.8 C) 99.2 F (37.3 C)  TempSrc: Oral Oral Oral Oral  SpO2: 97% 99% 100% 100%  Weight:      Height:        Intake/Output Summary (Last 24 hours) at 10/29/2018 1133 Last data filed at 10/29/2018 0327 Gross per 24 hour  Intake 1336.76 ml  Output 600 ml  Net 736.76 ml   Filed Weights   10/26/18 2350  Weight: 86 kg    Examination:  Constitutional: NAD, calm, comfortable Eyes: PERRL, lids and conjunctivae normal ENMT: Mucous membranes are moist. Posterior pharynx clear of any exudate or lesions.Normal dentition.  Neck: normal, supple, no masses, no thyromegaly Respiratory: clear to auscultation bilaterally, no wheezing, no crackles. Normal respiratory effort. No accessory muscle use.  Cardiovascular: Regular rate and rhythm, no murmurs / rubs / gallops. No extremity edema. 2+ pedal pulses. No carotid  bruits.  Abdomen: no tenderness, no masses palpated. No hepatosplenomegaly. Bowel sounds positive.  Musculoskeletal: no  clubbing / cyanosis. No joint deformity upper and lower extremities. Good ROM, no contractures. Normal muscle tone.  Skin: no rashes, lesions, ulcers. No induration Neurologic: CN 2-12 grossly intact. Sensation intact, DTR normal. Strength 5/5 in all 4.  Psychiatric: AAOx3, flat affect.    Data Reviewed:   CBC: Recent Labs  Lab 10/23/18 1019 10/25/18 1159 10/26/18 1702 10/27/18 0932 10/28/18 0420  WBC 15.4* 16.6* 13.7* 14.0* 12.3*  NEUTROABS  --  15.0* 12.9*  --   --   HGB 8.5* 7.4* 7.6* 9.6* 9.6*  HCT 27.7* 23.6* 25.0* 30.5* 30.8*  MCV 98.2 94.8 96.5 94.7 94.5  PLT 765* 799* 683* 766* 400*   Basic Metabolic Panel: Recent Labs  Lab 10/23/18 1019 10/25/18 1159 10/26/18 1702 10/27/18 0932 10/28/18 0420  NA 135 136 134* 137 136  K 4.1 4.5 4.4 4.3 4.4  CL 106 106 108 110 110  CO2 17* 18* 16* 18* 17*  GLUCOSE 111* 120* 179* 168* 153*  BUN 33* 37* 36* 42* 40*  CREATININE 2.18* 2.19* 1.66* 1.64* 1.56*  CALCIUM 9.7 9.8 8.4* 8.1* 8.4*  MG  --  2.0  --  2.2 2.3   GFR: Estimated Creatinine Clearance: 42 mL/min (A) (by C-G formula based on SCr of 1.56 mg/dL (H)). Liver Function Tests: Recent Labs  Lab 10/25/18 1159 10/26/18 1702 10/28/18 0420  AST 15 16 16   ALT 8 9 11   ALKPHOS 92 77 55  BILITOT 0.3 0.6 0.8  PROT 9.3* 8.3* 7.0  ALBUMIN 3.0* 3.0* 2.8*   Recent Labs  Lab 10/26/18 1702  LIPASE 33   Recent Labs  Lab 10/27/18 2053  AMMONIA 27   Coagulation Profile: No results for input(s): INR, PROTIME in the last 168 hours. Cardiac Enzymes: No results for input(s): CKTOTAL, CKMB, CKMBINDEX, TROPONINI in the last 168 hours. BNP (last 3 results) No results for input(s): PROBNP in the last 8760 hours. HbA1C: No results for input(s): HGBA1C in the last 72 hours. CBG: Recent Labs  Lab 10/24/18 0824 10/24/18 1224  GLUCAP 102* 92   Lipid Profile: No results for input(s): CHOL, HDL, LDLCALC, TRIG, CHOLHDL, LDLDIRECT in the last 72 hours. Thyroid Function  Tests: Recent Labs    10/27/18 0932  TSH 6.721*   Anemia Panel: Recent Labs    10/28/18 0420  VITAMINB12 259  FOLATE 4.2*   Sepsis Labs: No results for input(s): PROCALCITON, LATICACIDVEN in the last 168 hours.  Recent Results (from the past 240 hour(s))  Urine culture     Status: Abnormal   Collection Time: 10/26/18  5:28 PM  Result Value Ref Range Status   Specimen Description   Final    URINE, RANDOM Performed at [facility] [address], [place], [place] [address]    Special Requests   Final    NONE Performed at [facility], [place] [address], [place], [place] [address]    Culture (A)  Final    <10,000 COLONIES/mL INSIGNIFICANT GROWTH Performed at [facility] [address] [place], [place] [address]    Report Status 10/27/2018 FINAL  Final  Culture, blood (routine x 2)     Status: None (Preliminary result)   Collection Time: 10/27/18 12:14 AM  Result Value Ref Range Status   Specimen Description   Final    BLOOD RIGHT ANTECUBITAL Performed at [facility]  [place], [place] [address]    Special Requests   Final    BOTTLES DRAWN AEROBIC ONLY Blood Culture adequate volume Performed at [facility] [address] [place], [place] [address]    Culture   Final    NO GROWTH 2 DAYS Performed at [facility] [address] [place], [place] [address]    Report Status PENDING  Incomplete  Culture, blood (routine x 2)     Status: None (Preliminary result)   Collection Time: 10/27/18 12:14 AM  Result Value Ref Range Status   Specimen Description   Final    BLOOD LEFT ANTECUBITAL Performed at [facility] [address] [place] [address], [place], [place] [address]    Special Requests   Final    BOTTLES DRAWN AEROBIC AND ANAEROBIC Blood Culture adequate volume Performed at [facility] [address] [place], [place] [address]     Culture   Final    NO GROWTH 2 DAYS Performed at [facility] [address] [place], [place] [address]    Report Status PENDING  Incomplete         Radiology Studies: No results found.      Scheduled Meds: . bictegravir-emtricitabine-tenofovir AF  1 tablet Oral Daily  . docusate sodium  100 mg Oral BID  . folic acid  1 mg Oral Daily  . heparin injection (subcutaneous)  5,000 Units Subcutaneous Q8H  . levothyroxine  25 mcg Oral Q0600  . magnesium hydroxide  15 mL Oral Daily  . magnesium oxide  400 mg Oral BID  . morphine  60 mg Oral Q12H  . polyethylene glycol  17 g Oral Daily  . senna  2 tablet Oral Once  . sodium chloride flush  10-40 mL Intracatheter Q12H   Continuous Infusions: . ceFEPime (MAXIPIME) IV 2 g (10/29/18 0900)  . metronidazole 500 mg (10/29/18 0656)     LOS: 3 days   Time spent= 25 mins    Emmanuelle Hibbitts Arsenio Loader, MD [facility] Pager [phone]   If 7PM-7AM, please contact night-coverage [email or web address] Password [facility] 10/29/2018, 11:33 AM

## 2018-10-29 NOTE — Consult Note (Signed)
Consultation Note Date: 10/29/2018   Patient Name: Misty Thomas  DOB: [birth year]  MRN: [id number]  Age / Sex: 52 y.o., female  PCP: Patient, No Pcp Per Referring Physician: Damita Lack, MD  Reason for Consultation: Establishing goals of care and Psychosocial/spiritual support  HPI/Patient Profile: 53 y.o. female   admitted on 10/26/2018 with past   medical history significant of HIV, DM type II, anemia, and  advanced cervical CA, s/p primary therapy ending May 2019 with progression (recurrence) Dec 2019. Per GYN note: Pretreatment PET CT scan performed on January 22, 2018 revealed a large hypermetabolic cervical mass with local invasion into the perirectal space and bilateral pelvic lymphadenopathy. There was small scattered bilateral hypermetabolic lymph nodes in the neck and axilla are of uncertain significance. However due to a lack of abdominal nodal disease it was suspected that these were reactive. She was provided with a presumed stage of stage IIIc cervical cancer and in accordance with NCCN guidelines received definitive curative intent chemoradiation therapy, including external beam radiation between February 05, 2018 and Mar 16, 2018 with 45 Gy to the cervix and pelvis, and a sidewall parametrial boost of 9 Gy, and cervical intracavitary brachii therapy with iridium 192 for a total dose of 27.5 Gy.  She received posttreatment PET scan on July 11, 2018 which revealed significant response to therapy with decreased hypermetabolic soft tissue mass and left perirectal presacral soft tissue density and bilateral iliac lymphadenopathy there was no new or progressive metastatic disease identified.  PET 10/2018 showed progression and she was started on chemotherapy for palliative intent with cycle 1 being given 10/26/18.    Patient and her family face treatment option decisions, advanced directive  decisions and anticipatory care needs.   Clinical Assessment and Goals of Care:  This NP Wadie Lessen reviewed medical records, received report from team, assessed the patient and then meet at the patient's bedside  to discuss the role of palliative medicine in a holistic treatment plan acting as an extra layer of support for patient and her family.  Patient is lethargic but engaged in conversation with this nurse practitioner this morning  Values and goals of care important to patient were attempted to be elicited.  Created space and opportunity for patient to explore her thoughts and feeling regarding her current medical situation.   She was able to verbalize her fear of "never be able to get out of this bed again" and her fear of dying.  Emotional support offered  Questions and concerns addressed.  With patient's permission I attempted to contact her husband but was unable to talk with or leave a message.  PMT will continue to support holistically.   NEXT OF KIN    Discussed the importance of documentation of HPOA and AD.  Will have ongoing discussion     SUMMARY OF RECOMMENDATIONS    Code Status/Advance Care Planning:  Full code   Symptom Management:   Patient tells me her pain is controlled on current medications.  Demonstrated and worked with  patient through guided relaxation breathing techniques.. Discussed with Amethyst its usefulness as a tool  in pain management.   Palliative Prophylaxis:   Bowel Regimen, Delirium Protocol and Frequent Pain Assessment  Additional Recommendations (Limitations, Scope, Preferences):  Full Scope Treatment  Psycho-social/Spiritual:   Desire for further Chaplaincy support:yes  Additional Recommendations: Emotional support   Prognosis:   Unable to determine  Discharge Planning: To Be Determined      Primary Diagnoses: Present on Admission: . Perirectal abscess . Back pain . Hydronephrosis . HIV (human immunodeficiency  virus infection) ([place]) . Cervical cancer ([place]) . Anemia, blood loss   I have reviewed the medical record, interviewed the patient and family, and examined the patient. The following aspects are pertinent.  Past Medical History:  Diagnosis Date  . Acquired pancytopenia ([place]) 03/2018  . Anemia    Mild  . Cancer associated pain   . 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 Chemotherapy induced nausea and vomiting   . Diabetes mellitus type 2, diet-controlled ([place])    pt. denies No meds  . Frequency of urination   . History of cellulitis 2018   bilateral lower leg w/ mrsa  . History of external beam radiation therapy 02-05-2018  to 03-16-2018   cervical cancer  . History of MRSA infection 2018   w/ bilateral lower leg cellulitis  . HIV (human immunodeficiency virus infection) ([place])    asymptomatic  . Hypomagnesemia 03/2018   Severe---- takes oral magnesium and IV magnesium as needed (cancer center)  . Intermittent diarrhea    due to radiation/ chemo  . Nocturia   . Port-A-Cath in place    Power port  . Radiation burn    LOWER ABD.  04-06-2018  per is healing  . Subclinical hypothyroidism    w/ thyroiditis , dx 01-22-2018 PET scan  . Thrombocytopenia ([place])   . Wears glasses    Social History   Socioeconomic History  . Marital status: Married    Spouse name: [name]  . Number of children: 3  . Years of education: Not on file  . Highest education level: Not on file  Occupational History  . Not on file  Social Needs  . Financial resource strain: Somewhat hard  . Food insecurity:    Worry: Sometimes true    Inability: Sometimes true  . Transportation needs:    Medical: No    Non-medical: No  Tobacco Use  . Smoking status: Never  Smoker  . Smokeless tobacco: Never Used  Substance and Sexual Activity  . Alcohol use: Not Currently    Frequency: Never  . Drug use: No  . Sexual activity: Not Currently    Birth control/protection: Surgical  Lifestyle  . Physical activity:    Days per week: 0 days    Minutes per session: 0 min  . Stress: Rather much  Relationships  . Social connections:    Talks on phone: Once a week    Gets together: Once a week    Attends religious service: Never    Active member of club or organization: No    Attends meetings of clubs or organizations: Not on file    Relationship status: Married  Other Topics Concern  . Not on file  Social History Narrative  . Not  on file   Family History  Problem Relation Age of Onset  . Cancer Maternal Grandmother        unknown ca   Scheduled Meds: . bictegravir-emtricitabine-tenofovir AF  1 tablet Oral Daily  . docusate sodium  100 mg Oral BID  . folic acid  1 mg Oral Daily  . heparin injection (subcutaneous)  5,000 Units Subcutaneous Q8H  . levothyroxine  25 mcg Oral Q0600  . magnesium hydroxide  15 mL Oral Daily  . magnesium oxide  400 mg Oral BID  . morphine  60 mg Oral Q12H  . polyethylene glycol  17 g Oral Daily  . senna  2 tablet Oral Once  . sodium chloride flush  10-40 mL Intracatheter Q12H   Continuous Infusions: . ceFEPime (MAXIPIME) IV 2 g (10/29/18 0900)  . metronidazole 500 mg (10/29/18 0656)   PRN Meds:.acetaminophen **OR** acetaminophen, morphine, ondansetron **OR** ondansetron (ZOFRAN) IV, phenazopyridine, sodium chloride flush Medications Prior to Admission:  Prior to Admission medications   Medication Sig Start Date End Date Taking? Authorizing Provider  acetaminophen (TYLENOL) 500 MG tablet Take 1,000 mg by mouth every 6 (six) hours as needed for moderate pain or headache.    Yes [provider]  BIKTARVY 50-200-25 MG TABS tablet TAKE 1 TABLET BY MOUTH DAILY. 09/14/18  Yes Comer, Okey Regal, MD  dexamethasone  (DECADRON) 4 MG tablet Take 5 tabs at the night before and 5 tab the morning of chemotherapy, every 3 weeks, by mouth 10/15/18  Yes Gorsuch, Ni, MD  levothyroxine (SYNTHROID, LEVOTHROID) 25 MCG tablet Take 1 tablet (25 mcg total) by mouth daily before breakfast. 02/08/18  Yes Alvy Bimler, Ni, MD  lidocaine-prilocaine (EMLA) cream Apply to affected area once 10/26/18  Yes Gorsuch, Ni, MD  magnesium oxide (MAG-OX) 400 (241.3 Mg) MG tablet Take 1 tablet (400 mg total) by mouth 2 (two) times daily. 03/08/18  Yes Gorsuch, Ni, MD  morphine (MS CONTIN) 60 MG 12 hr tablet Take 1 tablet (60 mg total) by mouth every 12 (twelve) hours. 10/15/18  Yes Gorsuch, Ni, MD  morphine (MSIR) 30 MG tablet Take 1 tablet (30 mg total) by mouth every 6 (six) hours as needed for severe pain. 10/15/18  Yes [name], MD  phenazopyridine (PYRIDIUM) 200 MG tablet Take 1 tablet (200 mg total) by mouth 3 (three) times daily as needed for pain. 10/24/18  Yes Ardis Hughs, MD  polyethylene glycol [facility] Floria Raveling) packet Take 17 g by mouth daily.   Yes [provider]  ondansetron (ZOFRAN) 8 MG tablet Take 1 tablet (8 mg total) by mouth 2 (two) times daily as needed for refractory nausea / vomiting. Start on day 3 after chemo. 10/26/18   [name], MD  prochlorperazine (COMPAZINE) 10 MG tablet Take 1 tablet (10 mg total) by mouth every 6 (six) hours as needed (Nausea or vomiting). 10/26/18   [name], MD   No Known Allergies Review of Systems  Constitutional: Positive for fever.  Neurological: Positive for weakness.    Physical Exam Constitutional:      Appearance: She is ill-appearing.  Cardiovascular:     Rate and Rhythm: Normal rate and regular rhythm.  Skin:    General: Skin is warm and dry.    Per GYN note Pelvic exam - brown vaginal drainage, suspect feculant. On bimanual exam tumor burden palpated in pouch of [place] and posterior vagina with suspected tumor. Difficult exam in bed. On rectovaginal  there is a communication between the rectum  and the vagina. There is large tumor burden in the parametrium/deep posterior pelvis.   Vital Signs: BP 120/81 (BP Location: Right Arm)   Pulse (!) 114   Temp 99.2 F (37.3 C) (Oral)   Resp 19   Ht 5\' 1"  (1.549 m)   Wt 86 kg   LMP 02/03/2018   SpO2 100%   BMI 35.82 kg/m  Pain Scale: 0-10 POSS *See Group Information*: 1-Acceptable,Awake and alert Pain Score: Asleep   SpO2: SpO2: 100 % O2 Device:SpO2: 100 % O2 Flow Rate: .   IO: Intake/output summary:   Intake/Output Summary (Last 24 hours) at 10/29/2018 1118 Last data filed at 10/29/2018 0327 Gross per 24 hour  Intake 1336.76 ml  Output 600 ml  Net 736.76 ml    LBM: Last BM Date: (PTA per pt report ) Baseline Weight: Weight: 86 kg Most recent weight: Weight: 86 kg     Palliative Assessment/Data: 30 %    Discussed with bedside RN  Time In: 1000 Time Out: 1115 Time Total: 75 minutes Greater than 50%  of this time was spent counseling and coordinating care related to the above assessment and plan.  Signed by: Wadie Lessen, NP   Please contact Palliative Medicine Team phone at [phone] for questions and concerns.  For individual provider: See Shea Evans

## 2018-10-29 NOTE — Consult Note (Addendum)
[facility] Surgery Consult Note  Misty Thomas 05-10-1966  [id number].    Requesting MD: Denman George Chief Complaint/Reason for Consult: rectovaginal fistula HPI:  Patient is a 52 year old female who was sent to the ER from the cancer center 12/20 after first cycle of chemo for cervical cancer. She was sent for evaluation because 4 day history of abdominal pain and constipation. 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 Patient has had feculent looking drainage from vagina. Currently patient denies abdominal pain because she has just taken medications. Reports intermittent nausea.   PMH otherwise significant for HIV/AIDS and T2DM. She tells me that she does not currently take any antiretroviral medications. She denies past abdominal surgeries, however I do see tubal ligation listed in her history. NKDA.    ROS: Review of Systems  Gastrointestinal: Positive for abdominal pain, constipation and nausea.  Genitourinary:       Pelvic pain, vaginal discharge  Musculoskeletal: Positive for back pain.  All other systems reviewed and are negative.   Family History  Problem Relation Age of Onset  . Cancer Maternal Grandmother        unknown ca    Past Medical History:  Diagnosis Date  . Acquired pancytopenia ([place]) 03/2018  . Anemia    Mild  . Cancer associated pain   . 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 Chemotherapy induced nausea and vomiting   . Diabetes mellitus type 2, diet-controlled ([place])    pt. denies No meds  . Frequency of urination   . History of cellulitis 2018    bilateral lower leg w/ mrsa  . History of external beam radiation therapy 02-05-2018  to 03-16-2018   cervical cancer  . History of MRSA infection 2018   w/ bilateral lower leg cellulitis  . HIV (human immunodeficiency virus infection) ([place])    asymptomatic  . Hypomagnesemia 03/2018   Severe---- takes oral magnesium and IV magnesium as needed (cancer center)  . Intermittent diarrhea    due to radiation/ chemo  . Nocturia   . Port-A-Cath in place    Power port  . Radiation burn    LOWER ABD.  04-06-2018  per is healing  . Subclinical hypothyroidism    w/ thyroiditis , dx 01-22-2018 PET scan  . Thrombocytopenia ([place])   . Wears glasses     Past Surgical History:  Procedure Laterality Date  . CYSTOSCOPY     retrograde pyelogram right ureteral stent placement  . CYSTOSCOPY W/ URETERAL STENT PLACEMENT Bilateral 10/24/2018   Procedure: CYSTOSCOPY WITH BILATERAL RETROGRADE PYELOGRAM RIGHT Wyvonnia Dusky STENT PLACEMENT;  Surgeon: Ardis Hughs, MD;  Location: [facility];  Service: Urology;  Laterality: Bilateral;  . EUA/ CERVICAL BX  01-04-2018   dr Ihor Dow  [facility]  . IR FLUORO GUIDE PORT INSERTION RIGHT  02/01/2018  . TANDEM RING INSERTION N/A 03/20/2018   Procedure: TANDEM RING INSERTION;  Surgeon: Gery Pray, MD;  Location: [facility];  Service: Urology;  Laterality: N/A;  . TANDEM RING INSERTION N/A 03/26/2018   Procedure: TANDEM RING INSERTION;  Surgeon: Gery Pray, MD;  Location: [facility];  Service: Urology;  Laterality: N/A;  . TANDEM RING INSERTION N/A 04/04/2018   Procedure: TANDEM RING INSERTION;  Surgeon: Gery Pray, MD;  Location: [facility];  Service: Urology;  Laterality: N/A;  . TANDEM RING INSERTION N/A 04/12/2018   Procedure: TANDEM RING INSERTION;  Surgeon: Gery Pray, MD;  Location: [facility];  Service: Urology;  Laterality: N/A;  . TANDEM RING INSERTION N/A 04/18/2018   Procedure: TANDEM RING  INSERTION;  Surgeon: Gery Pray, MD;  Location: [facility];  Service: Urology;  Laterality: N/A;  . TUBAL LIGATION Bilateral 1990s    Social History:  reports that she has never smoked. She has never used smokeless tobacco. She reports previous alcohol use. She reports that she does not use drugs.  Allergies: No Known Allergies  Medications Prior to Admission  Medication Sig Dispense Refill  . acetaminophen (TYLENOL) 500 MG tablet Take 1,000 mg by mouth every 6 (six) hours as needed for moderate pain or headache.     Marland Kitchen BIKTARVY 50-200-25 MG TABS tablet TAKE 1 TABLET BY MOUTH DAILY. 30 tablet 5  . dexamethasone (DECADRON) 4 MG tablet Take 5 tabs at the night before and 5 tab the morning of chemotherapy, every 3 weeks, by mouth 60 tablet 0  . levothyroxine (SYNTHROID, LEVOTHROID) 25 MCG tablet Take 1 tablet (25 mcg total) by mouth daily before breakfast. 30 tablet 1  . lidocaine-prilocaine (EMLA) cream Apply to affected area once 30 g 3  . magnesium oxide (MAG-OX) 400 (241.3 Mg) MG tablet Take 1 tablet (400 mg total) by mouth 2 (two) times daily. 60 tablet 3  . morphine (MS CONTIN) 60 MG 12 hr tablet Take 1 tablet (60 mg total) by mouth every 12 (twelve) hours. 60 tablet 0  . morphine (MSIR) 30 MG tablet Take 1 tablet (30 mg total) by mouth every 6 (six) hours as needed for severe pain. 90 tablet 0  . phenazopyridine (PYRIDIUM) 200 MG tablet Take 1 tablet (200 mg total) by mouth 3 (three) times daily as needed for pain. 10 tablet 0  . polyethylene glycol (MIRALAX / GLYCOLAX) packet Take 17 g by mouth daily.    . ondansetron (ZOFRAN) 8 MG tablet Take 1 tablet (8 mg total) by mouth 2 (two) times daily as needed for refractory nausea / vomiting. Start on day 3 after chemo. 30 tablet 1  . prochlorperazine (COMPAZINE) 10 MG tablet Take 1 tablet (10 mg total) by mouth every 6 (six) hours as needed (Nausea or vomiting). 30 tablet 1    Blood pressure 118/77, pulse (!) 129,  temperature 98.6 F (37 C), temperature source Oral, resp. rate 18, height 5\' 1"  (1.549 m), weight 86 kg, last menstrual period 02/03/2018, SpO2 100 %. Physical Exam: Physical Exam Constitutional:      Appearance: She is well-developed and overweight. She is not ill-appearing or toxic-appearing.  HENT:     Head: Normocephalic and atraumatic.     Right Ear: External ear normal.     Left Ear: External ear normal.     Nose: Nose normal.     Mouth/Throat:     Mouth: Mucous membranes are moist.  Eyes:     General: Lids are normal. No scleral icterus.    Extraocular Movements: Extraocular movements intact.  Neck:     Musculoskeletal: Normal range of motion and neck supple.  Cardiovascular:     Rate and Rhythm: Normal rate and regular rhythm.  Pulmonary:     Effort: Pulmonary effort  is normal.     Breath sounds: Normal breath sounds.  Abdominal:     General: Bowel sounds are normal. There is no distension.     Palpations: Abdomen is soft.     Tenderness: There is no abdominal tenderness. There is no guarding or rebound.  Musculoskeletal:     Comments: ROM grossly intact in bilateral upper and lower extremities  Skin:    General: Skin is warm and dry.  Neurological:     Mental Status: She is alert and oriented to person, place, and time.  Psychiatric:        Mood and Affect: Mood and affect normal.        Behavior: Behavior is cooperative.     Results for orders placed or performed during the hospital encounter of 10/26/18 (from the past 48 hour(s))  Ammonia     Status: None   Collection Time: 10/27/18  8:53 PM  Result Value Ref Range   Ammonia 27 9 - 35 umol/L    Comment: Performed at [facility], [place] [address] [place], [place] [address]  Blood gas, venous     Status: Abnormal   Collection Time: 10/28/18  4:18 AM  Result Value Ref Range   FIO2 21.00    pH, Ven 7.305 7.250 - 7.430   pCO2, Ven 38.0 (L) 44.0 - 60.0 mmHg   pO2, Ven 43.0 32.0 - 45.0 mmHg    Bicarbonate 18.3 (L) 20.0 - 28.0 mmol/L   Acid-base deficit 6.9 (H) 0.0 - 2.0 mmol/L   O2 Saturation 73.9 %   Patient temperature 98.8    Collection site VEIN    Drawn by [phone]    Sample type VEIN     Comment: Performed at [facility], [place] [address], [place], [place] [address]  Vitamin B12     Status: None   Collection Time: 10/28/18  4:20 AM  Result Value Ref Range   Vitamin B-12 259 180 - 914 pg/mL    Comment: (NOTE) This assay is not validated for testing neonatal or myeloproliferative syndrome specimens for Vitamin B12 levels. Performed at [facility], [place] [address], [place], [place] [address]   Folate     Status: Abnormal   Collection Time: 10/28/18  4:20 AM  Result Value Ref Range   Folate 4.2 (L) >5.9 ng/mL    Comment: Performed at [facility], [place] [address] [place], [place] [address]  RPR     Status: None   Collection Time: 10/28/18  4:20 AM  Result Value Ref Range   RPR Ser Ql Non Reactive Non Reactive    Comment: (NOTE) Performed At: [facility] [place], [place] [id number] Rush Farmer MD ON:[id number]   CBC     Status: Abnormal   Collection Time: 10/28/18  4:20 AM  Result Value Ref Range   WBC 12.3 (H) 4.0 - 10.5 K/uL   RBC 3.26 (L) 3.87 - 5.11 MIL/uL   Hemoglobin 9.6 (L) 12.0 - 15.0 g/dL   HCT 30.8 (L) 36.0 - 46.0 %   MCV 94.5 80.0 - 100.0 fL   MCH 29.4 26.0 - 34.0 pg   MCHC 31.2 30.0 - 36.0 g/dL   RDW 16.6 (H) 11.5 - 15.5 %   Platelets 616 (H) 150 - 400 K/uL   nRBC 0.2 0.0 - 0.2 %    Comment: Performed at [facility], [place] [address] [place], [place] [address]  Comprehensive metabolic  panel     Status: Abnormal   Collection Time: 10/28/18  4:20 AM  Result Value Ref Range   Sodium 136 135 - 145 mmol/L   Potassium 4.4 3.5 - 5.1 mmol/L   Chloride 110 98 - 111 mmol/L   CO2 17 (L) 22 - 32 mmol/L   Glucose, Bld 153 (H) 70 - 99 mg/dL   BUN 40 (H)  6 - 20 mg/dL   Creatinine, Ser 1.56 (H) 0.44 - 1.00 mg/dL   Calcium 8.4 (L) 8.9 - 10.3 mg/dL   Total Protein 7.0 6.5 - 8.1 g/dL   Albumin 2.8 (L) 3.5 - 5.0 g/dL   AST 16 15 - 41 U/L   ALT 11 0 - 44 U/L   Alkaline Phosphatase 55 38 - 126 U/L   Total Bilirubin 0.8 0.3 - 1.2 mg/dL   GFR calc non Af Amer 38 (L) >60 mL/min   GFR calc Af Amer 44 (L) >60 mL/min   Anion gap 9 5 - 15    Comment: Performed at [facility], [place] [address] [place], [place] [address]  Magnesium     Status: None   Collection Time: 10/28/18  4:20 AM  Result Value Ref Range   Magnesium 2.3 1.7 - 2.4 mg/dL    Comment: Performed at [facility], [place] [address], [place],  [address]   No results found.    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   Brigid Re, [facility] Surgery 10/29/2018, 2:53 PM Pager: [phone] Consults: [phone]  Agree with above.  I spent 30+ minutes talking to the patient, her brother, Margit Hanks, and her daughter, Janeece Agee (by phone). Note - she is married.  Her husband is [place].  She has another daughter, April Parker. I'm not sure how much insight she has into her disease.  She sees Drs. Gorsuch/Kinard/Rossi for the cervical cancer. Dr. Louis Meckel placed a right ureteral stent on 10/24/2018.  Note, CT scan on 10/26/2018 showed mild to moderate left hydronephrosis.  I discussed diverting colostomy, the perioperative course and the post operative expectations.  The colostomy will help with any obstruction that she has, it may or may not help with pelvic pain/back pain.  At this time, she is not ready to make a decision about surgery.  I spoke to Dr. Alvy Bimler, who is  concerned about proceeding with major surgery just after chemotx and would prefer holding off for now. There is no urgency for surgery and we can follow her and see how she does.  Alphonsa Overall, MD, [facility] Surgery Pager: [phone] Office phone:  [phone]

## 2018-10-29 NOTE — Progress Notes (Signed)
Assessment/Plan:  52 y.o.  H/o advanced cervical CA now with progression/early recurrence, pain, rectovaginal fistula and progression of disease explain some of her symptoms.   Pain:  Pain is likely due to disease progression, tumor necrosis, and no BM for several days. Consider palliative care consult for optimization of outpatient regimen.  Pulm: no issues  Heme: Stable  CV:  Per primary team  Prophylaxis: OK from my standpoint for Lovenox or heparin while admitted.   GI:    . Tolerating po: Yes    . Rectovaginal fistula and disease progression with possible associated abcess . I discussed with her that I am recommending diverting stoma to assist in her palliation of pelvic symptoms.  . Constipation suspect multifactorial; starting new narcotic regimen last week plus tumor volume increase in the pelvis. Continue Miralax, milk of mag (as long as creat OK), and Colace  GU: . Per urology possible outpatient stent for contralateral side.  FEN:  . Per primary  Endo:  Apparent diet controlled diabetes. Per Primary  ID: Possible abcess vs necrotic tumor in the cul de sac. wbc coming down with antibiotics.  Gyn/Oncology: goal of therapy is palliative. Recommend continuing with chemotherapy once diverted and no evidence of active infection. Estimated life expectancy 12-12 months.    1418/1418-01  Past Medical History:  Diagnosis Date  . Acquired pancytopenia ([place]) 03/2018  . Anemia    Mild  . Cancer associated pain   . 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 Chemotherapy induced nausea and vomiting   . Diabetes mellitus type 2, diet-controlled ([place])    pt. denies No meds  . Frequency of urination   . History of cellulitis 2018    bilateral lower leg w/ mrsa  . History of external beam radiation therapy 02-05-2018  to 03-16-2018   cervical cancer  . History of MRSA infection 2018   w/ bilateral lower leg cellulitis  . HIV (human immunodeficiency virus infection) ([place])    asymptomatic  . Hypomagnesemia 03/2018   Severe---- takes oral magnesium and IV magnesium as needed (cancer center)  . Intermittent diarrhea    due to radiation/ chemo  . Nocturia   . Port-A-Cath in place    Power port  . Radiation burn    LOWER ABD.  04-06-2018  per is healing  . Subclinical hypothyroidism    w/ thyroiditis , dx 01-22-2018 PET scan  . Thrombocytopenia ([place])   . Wears glasses      Subjective: Patient reports still in pain (presacral area), reports vaginal drainage of stool  Objective: Vital signs in last 24 hours: Temp:  [98.2 F (36.8 C)-99.2 F (37.3 C)] 98.6 F (37 C) (12/23 1326) Pulse Rate:  [75-129] 129 (12/23 1326) Resp:  [18-19] 18 (12/23 1326) BP: (118-125)/(77-81) 118/77 (12/23 1326) SpO2:  [100 %] 100 % (12/23 1326) Last BM Date: (PTA per pt report )  Intake/Output from previous day: 12/22 0701 - 12/23 0700 In: 1824.6 [P.O.:340; I.V.:924.2; IV Piggyback:560.4] Out: 600 [Urine:600] Patient Vitals for the past 24 hrs:  Urine Occurrence Stool Descriptors  10/29/18 1250 1 -  10/28/18 1850 1 Brown  10/28/18 1455 1 -     Physical Examination: Pelvic exam - brown vaginal drainage, suspect feculant. On bimanual exam tumor burden palpated in pouch of  [place] and posterior vagina with suspected tumor. Difficult exam in bed. On rectovaginal there is a communication between the rectum and the vagina. There is large tumor burden in the parametrium/deep posterior pelvis.   Labs:      CBC Latest Ref Rng & Units 10/28/2018 10/27/2018 10/26/2018  WBC 4.0 - 10.5 K/uL 12.3(H) 14.0(H) 13.7(H)  Hemoglobin 12.0 - 15.0 g/dL 9.6(L) 9.6(L) 7.6(L)  Hematocrit 36.0 - 46.0 % 30.8(L) 30.5(L) 25.0(L)  Platelets 150 -  400 K/uL 616(H) 766(H) 683(H)   BMP Latest Ref Rng & Units 10/28/2018 10/27/2018 10/26/2018  Glucose 70 - 99 mg/dL 153(H) 168(H) 179(H)  BUN 6 - 20 mg/dL 40(H) 42(H) 36(H)  Creatinine 0.44 - 1.00 mg/dL 1.56(H) 1.64(H) 1.66(H)  BUN/Creat Ratio 6 - 22 (calc) - - -  Sodium 135 - 145 mmol/L 136 137 134(L)  Potassium 3.5 - 5.1 mmol/L 4.4 4.3 4.4  Chloride 98 - 111 mmol/L 110 110 108  CO2 22 - 32 mmol/L 17(L) 18(L) 16(L)  Calcium 8.9 - 10.3 mg/dL 8.4(L) 8.1(L) 8.4(L)       LOS: 3 days    Thereasa Solo 10/29/2018, 1:50 PM

## 2018-10-30 ENCOUNTER — Encounter (HOSPITAL_COMMUNITY)
Admission: EM | Disposition: A | Discharge status: Home Health | Payer: Self-pay | Source: Home / Self Care | Attending: Internal Medicine

## 2018-10-30 ENCOUNTER — Inpatient Hospital Stay (HOSPITAL_COMMUNITY): Payer: Medicaid Other

## 2018-10-30 DIAGNOSIS — Z515 Encounter for palliative care: Secondary | ICD-10-CM

## 2018-10-30 DIAGNOSIS — N132 Hydronephrosis with renal and ureteral calculous obstruction: Secondary | ICD-10-CM

## 2018-10-30 LAB — CBC
HEMATOCRIT: 30.8 % — AB (ref 36.0–46.0)
Hemoglobin: 9.8 g/dL — ABNORMAL LOW (ref 12.0–15.0)
MCH: 29.8 pg (ref 26.0–34.0)
MCHC: 31.8 g/dL (ref 30.0–36.0)
MCV: 93.6 fL (ref 80.0–100.0)
Platelets: 452 10*3/uL — ABNORMAL HIGH (ref 150–400)
RBC: 3.29 MIL/uL — ABNORMAL LOW (ref 3.87–5.11)
RDW: 15.6 % — AB (ref 11.5–15.5)
WBC: 11.7 10*3/uL — ABNORMAL HIGH (ref 4.0–10.5)
nRBC: 0 % (ref 0.0–0.2)

## 2018-10-30 LAB — BASIC METABOLIC PANEL
Anion gap: 8 (ref 5–15)
BUN: 35 mg/dL — ABNORMAL HIGH (ref 6–20)
CO2: 18 mmol/L — ABNORMAL LOW (ref 22–32)
Calcium: 8.3 mg/dL — ABNORMAL LOW (ref 8.9–10.3)
Chloride: 107 mmol/L (ref 98–111)
Creatinine, Ser: 1.77 mg/dL — ABNORMAL HIGH (ref 0.44–1.00)
GFR calc Af Amer: 38 mL/min — ABNORMAL LOW (ref >60)
GFR calc non Af Amer: 32 mL/min — ABNORMAL LOW (ref >60)
Glucose, Bld: 157 mg/dL — ABNORMAL HIGH (ref 70–99)
Potassium: 4.3 mmol/L (ref 3.5–5.1)
Sodium: 133 mmol/L — ABNORMAL LOW (ref 135–145)

## 2018-10-30 LAB — OCCULT BLOOD X 1 CARD TO LAB, STOOL: Fecal Occult Bld: NEGATIVE

## 2018-10-30 LAB — MAGNESIUM: Magnesium: 2.2 mg/dL (ref 1.7–2.4)

## 2018-10-30 SURGERY — CREATION, COLOSTOMY, DIVERTING, LAPAROSCOPIC
Anesthesia: General

## 2018-10-30 MED ORDER — BOOST / RESOURCE BREEZE PO LIQD CUSTOM
1.0000 | Freq: Three times a day (TID) | ORAL | Status: DC
  Administered 2018-10-30 – 2018-11-09 (×20): 1 via ORAL

## 2018-10-30 MED ORDER — SODIUM CHLORIDE 0.9 % IV SOLN
INTRAVENOUS | Status: DC | PRN
  Administered 2018-10-30: 1000 mL via INTRAVENOUS
  Administered 2018-11-04: 250 mL via INTRAVENOUS
  Administered 2018-11-07: 1000 mL via INTRAVENOUS
  Administered 2018-11-07: 250 mL via INTRAVENOUS

## 2018-10-30 NOTE — Progress Notes (Signed)
PROGRESS NOTE    JONNELLE LAWNICZAK  JQB:[id number] DOB: [birth year] DOA: 10/26/2018 PCP: Patient, No Pcp Per   Brief Narrative:  52 year old with history of HIV, cervical cancer on chemotherapy, diabetes mellitus type 2, anemia came to the hospital with lower back pain.  She notes having back pain for some time even prior to her recent chest cystostomy with stent placement for right-sided hydronephrosis and hydroureter.  Admitted for concerns for rectal abscess, progression of her poorly differentiated cervical carcinoma and possible tumor necrosis.Overall poor prognosis. Oncology, Gen Surgery, Gyn Surgery and Palliative Care team following.   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  Oncology team is following.  At this time we will continue supportive care - Appreciate input from palliative care -Supportive care, pain control - Broad-spectrum antibiotic-cefepime and Flagyl for now -Pain control, bowel regimen -Abdominal x-ray-rule out any acute pathology  Hypothyroidism - Synthroid 25 mcg daily  HIV -Continue home medication.  Last CD4 count August 2019-130  Diabetes mellitus type 2 -Diet  controlled.  Hemoglobin A1c 5.512/2019   DVT prophylaxis: Hep SQ Code Status: Full code Family Communication: None Disposition Plan: Maintain inpatient stay  Consultants:   Urology  GYN/oncology  Oncology   Antimicrobials:   Cefepime and Flagyl day 5   Subjective: Overall appears  That her mood is down. No new complaints besides pelvic pain.   Review of Systems Otherwise negative except as per HPI, including: General = no fevers, chills, dizziness, malaise, fatigue HEENT/EYES = negative for pain, redness, loss of vision, double vision, blurred vision, loss of hearing, sore throat, hoarseness, dysphagia Cardiovascular= negative for chest pain, palpitation, murmurs, lower extremity swelling Respiratory/lungs= negative for shortness of breath, cough, hemoptysis, wheezing, mucus production Gastrointestinal= negative for nausea, vomiting,, abdominal pain, melena, hematemesis Genitourinary= negative for Dysuria, Hematuria, Change in Urinary Frequency MSK = Negative for arthralgia, myalgias, Back Pain, Joint swelling  Neurology= Negative for headache, seizures, numbness, tingling  Psychiatry= Negative for anxiety, depression, suicidal and homocidal ideation Allergy/Immunology= Medication/Food allergy as listed  Skin= Negative for Rash, lesions, ulcers, itching   Objective: Vitals:   10/29/18 0448 10/29/18 1326 10/29/18 2127 10/30/18 0547  BP: 120/81 118/77 133/85 110/83  Pulse: (!) 114 (!) 129 (!) 116 (!) 115  Resp: 19 18 16 16   Temp: 99.2 F (37.3 C) 98.6 F (37 C) 98.5 F (36.9 C) 99.5 F (37.5 C)  TempSrc: Oral Oral Oral Oral  SpO2: 100% 100% 100% 100%  Weight:      Height:        Intake/Output Summary (Last 24 hours) at 10/30/2018 1202 Last data filed at 10/30/2018 0803 Gross per 24 hour  Intake 454.2 ml  Output 0 ml  Net 454.2 ml   Filed Weights   10/26/18 2350  Weight: 86 kg    Examination:  Constitutional: NAD, calm, comfortable Eyes: PERRL,  lids and conjunctivae normal ENMT: Mucous membranes are moist. Posterior pharynx clear of any exudate or lesions.Normal dentition.  Neck: normal, supple, no masses, no thyromegaly Respiratory: clear to auscultation bilaterally, no wheezing, no crackles. Normal respiratory effort. No accessory muscle use.  Cardiovascular: Regular rate and rhythm, no murmurs / rubs / gallops. No extremity edema. 2+ pedal pulses. No carotid bruits.  Abdomen: no tenderness, no masses palpated. No hepatosplenomegaly. Bowel sounds positive.  Musculoskeletal: no clubbing / cyanosis. No joint deformity upper and lower extremities. Good ROM, no contractures. Normal muscle tone.  Skin: no rashes, lesions, ulcers. No induration Neurologic: CN 2-12 grossly intact. Sensation intact, DTR normal. Strength 5/5 in all 4.  Psychiatric: flat affect.      Data Reviewed:   CBC: Recent Labs  Lab 10/25/18 1159 10/26/18 1702 10/27/18 0932 10/28/18 0420 10/30/18 0507  WBC 16.6* 13.7* 14.0* 12.3* 11.7*  NEUTROABS 15.0* 12.9*  --   --   --   HGB 7.4* 7.6* 9.6* 9.6* 9.8*  HCT 23.6* 25.0* 30.5* 30.8* 30.8*  MCV 94.8 96.5 94.7 94.5 93.6  PLT 799* 683* 766* 616* 629*   Basic Metabolic Panel: Recent Labs  Lab 10/25/18 1159 10/26/18 1702 10/27/18 0932 10/28/18 0420 10/30/18 0507  NA 136 134* 137 136 133*  K 4.5 4.4 4.3 4.4 4.3  CL 106 108 110 110 107  CO2 18* 16* 18* 17* 18*  GLUCOSE 120* 179* 168* 153* 157*  BUN 37* 36* 42* 40* 35*  CREATININE 2.19* 1.66* 1.64* 1.56* 1.77*  CALCIUM 9.8 8.4* 8.1* 8.4* 8.3*  MG 2.0  --  2.2 2.3 2.2   GFR: Estimated Creatinine Clearance: 37 mL/min (A) (by C-G formula based on SCr of 1.77 mg/dL (H)). Liver Function Tests: Recent Labs  Lab 10/25/18 1159 10/26/18 1702 10/28/18 0420  AST 15 16 16   ALT 8 9 11   ALKPHOS 92 77 55  BILITOT 0.3 0.6 0.8  PROT 9.3* 8.3* 7.0  ALBUMIN 3.0* 3.0* 2.8*   Recent Labs  Lab 10/26/18 1702  LIPASE 33   Recent Labs  Lab 10/27/18 2053    AMMONIA 27   Coagulation Profile: No results for input(s): INR, PROTIME in the last 168 hours. Cardiac Enzymes: No results for input(s): CKTOTAL, CKMB, CKMBINDEX, TROPONINI in the last 168 hours. BNP (last 3 results) No results for input(s): PROBNP in the last 8760 hours. HbA1C: No results for input(s): HGBA1C in the last 72 hours. CBG: Recent Labs  Lab 10/24/18 0824 10/24/18 1224  GLUCAP 102* 92   Lipid Profile: No results for input(s): CHOL, HDL, LDLCALC, TRIG, CHOLHDL, LDLDIRECT in the last 72 hours. Thyroid Function Tests: No results for input(s): TSH, T4TOTAL, FREET4, T3FREE, THYROIDAB in the last 72 hours. Anemia Panel: Recent Labs    10/28/18 0420  VITAMINB12 259  FOLATE 4.2*   Sepsis Labs: No results for input(s): PROCALCITON, LATICACIDVEN in the last 168 hours.  Recent Results (from the past 240 hour(s))  Urine culture     Status: Abnormal   Collection Time: 10/26/18  5:28 PM  Result Value Ref Range Status   Specimen Description   Final    URINE, RANDOM Performed at [facility] [address] [place], [place] [address]    Special Requests   Final    NONE Performed at [facility], [place] [address], [place], [place] [address]    Culture (A)  Final    <  10,000 COLONIES/mL INSIGNIFICANT GROWTH Performed at [facility], [place] [address], [place], [place] [address]    Report Status 10/27/2018 FINAL  Final  Culture, blood (routine x 2)     Status: None (Preliminary result)   Collection Time: 10/27/18 12:14 AM  Result Value Ref Range Status   Specimen Description   Final    BLOOD RIGHT ANTECUBITAL Performed at [facility] [address] [place], [place] [address]    Special Requests   Final    BOTTLES DRAWN AEROBIC ONLY Blood Culture adequate volume Performed at [facility] [address] [place], [place] [address]    Culture   Final    NO GROWTH 3 DAYS Performed at  [facility], [place] [address] [place], [place] [address]    Report Status PENDING  Incomplete  Culture, blood (routine x 2)     Status: None (Preliminary result)   Collection Time: 10/27/18 12:14 AM  Result Value Ref Range Status   Specimen Description   Final    BLOOD LEFT ANTECUBITAL Performed at [facility] [address] [place], [place] [address]    Special Requests   Final    BOTTLES DRAWN AEROBIC AND ANAEROBIC Blood Culture adequate volume Performed at [facility] [address] [place], [place] [address]    Culture   Final    NO GROWTH 3 DAYS Performed at [facility], [place] [address], [place], [place] [address]    Report Status PENDING  Incomplete         Radiology Studies: No results found.      Scheduled Meds: . bictegravir-emtricitabine-tenofovir AF  1 tablet Oral Daily  . docusate sodium  100 mg Oral BID  . folic acid  1 mg Oral Daily  . heparin injection (subcutaneous)  5,000 Units Subcutaneous Q8H  . levothyroxine  25 mcg Oral Q0600  . magnesium hydroxide  15 mL Oral Daily  . magnesium oxide  400 mg Oral BID  . morphine  60 mg Oral Q12H  . polyethylene glycol  17 g Oral Daily  . sodium chloride flush  10-40 mL Intracatheter Q12H   Continuous Infusions: . sodium chloride 1,000 mL (10/30/18 0803)  . ceFEPime (MAXIPIME) IV 200 mL/hr at 10/30/18 0803  . metronidazole 500 mg (10/30/18 0546)     LOS: 4 days   Time spent= 30 mins    Traxton Kolenda Arsenio Loader, MD [facility] Pager [phone]   If 7PM-7AM, please contact night-coverage [email or web address] Password [facility] 10/30/2018, 12:02 PM

## 2018-10-30 NOTE — Progress Notes (Signed)
[facility]:  [phone] General Surgery Progress Note   LOS: 4 days  POD -     Chief Complaint: Back pain, constipation  Assessment and Plan: 1.  Locally advanced cervical cancer with rectovaginal fistula and pelvic/rectal pain   Treating physicians - Rossi/Gorsuch/Kinard  First dose of chemotx - 10/26/2018  Considering diverting colostomy - but for now on hold.  The patient is not ready and, by discussions with Dr. Alvy Bimler, she would prefer holding off for now.  2.  Left perirectal fluid collection - abscess vs necrotic tumor  WBC - 11,700 - 10/30/2018  On Cefepime/Flagyl 3.  Hypothyroidism - synthroid 4.  HIV/AIDS - last CD4 in August 130 5.  T2DM - diet controlled, A1c 5.5 10/2018 6.  R hydronephrosis/hydroureter - s/p stenting  Managed by Dr. Louis Meckel  Creatinine - 1.77 - 10/30/2018 7.  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  She sees a little disconnected with what is going on.  She has had no BM.  Objective:   Vitals:   10/29/18 2127 10/30/18 0547  BP: 133/85 110/83  Pulse: (!) 116 (!) 115  Resp: 16 16  Temp: 98.5 F (36.9 C) 99.5 F (37.5 C)  SpO2: 100% 100%     Intake/Output from previous day:  12/23 0701 - 12/24 0700 In: 442.8 [P.O.:120; IV Piggyback:322.8] Out: 0   Intake/Output this shift:  No intake/output data recorded.   Physical Exam:   General: Mildly obese AA F who  is alert and oriented.    HEENT: Normal. Pupils equal. .   Lungs: Clear   Abdomen: Soft.  Has BS.   Lab Results:    Recent Labs    10/28/18 0420 10/30/18 0507  WBC 12.3* 11.7*  HGB 9.6* 9.8*  HCT 30.8* 30.8*  PLT 616* 452*    BMET   Recent Labs    10/28/18 0420 10/30/18 0507  NA 136 133*  K 4.4 4.3  CL 110 107  CO2 17* 18*  GLUCOSE 153* 157*  BUN 40* 35*  CREATININE 1.56* 1.77*  CALCIUM 8.4* 8.3*    PT/INR  No results for input(s): LABPROT, INR in the last 72 hours.  ABG   Recent Labs    10/28/18 0418  HCO3 18.3*     Studies/Results:  No results found.   Anti-infectives:   Anti-infectives (From admission, onward)   Start     Dose/Rate Route Frequency Ordered Stop   10/27/18 2000  ceFEPIme (MAXIPIME) 2 g in sodium chloride 0.9 % 100 mL IVPB     2 g 200 mL/hr over 30 Minutes Intravenous Every 12 hours 10/27/18 1643     10/27/18 1000  bictegravir-emtricitabine-tenofovir AF (BIKTARVY) 50-200-25 MG per tablet 1 tablet     1 tablet Oral Daily 10/26/18 2209     10/27/18 1000  ceFEPIme (MAXIPIME) 1 g in sodium chloride 0.9 % 100 mL IVPB  Status:  Discontinued  1 g 200 mL/hr over 30 Minutes Intravenous Every 12 hours 10/27/18 0335 10/27/18 1643   10/26/18 2230  metroNIDAZOLE (FLAGYL) IVPB 500 mg     500 mg 100 mL/hr over 60 Minutes Intravenous Every 8 hours 10/26/18 2212     10/26/18 2215  ceFEPIme (MAXIPIME) 2 g in sodium chloride 0.9 % 100 mL IVPB     2 g 200 mL/hr over 30 Minutes Intravenous  Once 10/26/18 2212 10/27/18 0106      Alphonsa Overall, MD, FACS Pager: [facility]: [phone] 10/30/2018

## 2018-10-30 NOTE — Progress Notes (Signed)
Misty Thomas   DOB:08-May-1966   QQ#:[id number]    Assessment & Plan:   Recurrent, locally advanced cervical cancer She has received first dose of chemotherapy on October 26, 2018. I expect her blood count will nadir at the end of the week Continue supportive care  Necrotic tumor with vaginal/rectal fistula, probable infection/abscess It is hard to interpret her imaging study but the patient likely have necrotic tumor complicated by fistula.  She has received broad-spectrum IV antibiotics I had extensive discussion with GYN oncologist and general surgery on 12/23 about the role of palliative, diversion colostomy. She would be at very high risk of complication with infection and probable wound healing given prior history of radiation, recent chemotherapy, background history of HIV infection and protein calorie malnutrition. For now, I favor conservative management with IV antibiotics and supportive care  HIV infection She will continue antiretroviral treatment  Severe pelvic pain Due to infection, disease and fistula She will continue to receive intermittent doses of pain medicine as needed  Altered mental status/confusion, somewhat sedated She appears somewhat sedated on exam Suspect it could be related to pain medicine.  Recent CT imaging showed no evidence of intracranial metastasis  Severe constipation Multifactorial, could be due to disease and side effects of medication Recommend continue on aggressive laxative therapy Consider repeat x-ray of her abdomen to rule out subacute bowel obstruction if indicated  Chronic renal failure Due to hydronephrosis and disease She had stent placement recently. Slightly worse, question related to poor oral intake.  Consider IV fluids if indicated.  Will defer to primary service  Chronic anemia Due to recent renal disease and chemotherapy.  Transfuse as needed if hemoglobin is less than 8  Moderate protein calorie malnutrition Due  to her disease Nutritional supplement as needed  Goals of care discussion She had numerous discussions with surgical teams In my opinion, I would favor conservative management right now rather than diversion colostomy due to high risk of complication.  Discharge planning Not ready for discharge. I will not be here tomorrow but will return on November 01, 2018.  Please call consult service if questions arise  [name], MD 10/30/2018  8:04 AM   Subjective:  No family by the bedside.  The patient is somewhat sedated.  She does answer to questions.  She stated her pain is well controlled.  She denies nausea.  She has no bowel movement for over 5 days  Objective:  Vitals:   10/29/18 2127 10/30/18 0547  BP: 133/85 110/83  Pulse: (!) 116 (!) 115  Resp: 16 16  Temp: 98.5 F (36.9 C) 99.5 F (37.5 C)  SpO2: 100% 100%     Intake/Output Summary (Last 24 hours) at 10/30/2018 0804 Last data filed at 10/30/2018 0600 Gross per 24 hour  Intake 442.76 ml  Output 0 ml  Net 442.76 ml    GENERAL: Intermittently alert.  Able to answer questions but appears somewhat sedated SKIN: skin color, texture, turgor are normal, no rashes or significant lesions EYES: normal, Conjunctiva are pink and non-injected, sclera clear OROPHARYNX:no exudate, no erythema and lips, buccal mucosa, and tongue normal  NECK: supple, thyroid normal size, non-tender, without nodularity LYMPH:  no palpable lymphadenopathy in the cervical, axillary or inguinal LUNGS: clear to auscultation and percussion with normal breathing effort HEART: regular rate & rhythm and no murmurs and no lower extremity edema ABDOMEN:abdomen soft, mild tenderness in the suprapubic region without rebound or guarding Musculoskeletal:no cyanosis of digits and no clubbing  Labs:  Lab Results  Component Value Date   WBC 11.7 (H) 10/30/2018   HGB 9.8 (L) 10/30/2018   HCT 30.8 (L) 10/30/2018   MCV 93.6 10/30/2018   PLT 452 (H)  10/30/2018   NEUTROABS 12.9 (H) 10/26/2018    Lab Results  Component Value Date   NA 133 (L) 10/30/2018   K 4.3 10/30/2018   CL 107 10/30/2018   CO2 18 (L) 10/30/2018    Studies:  No results found.

## 2018-10-30 NOTE — Progress Notes (Signed)
Initial Nutrition Assessment  DOCUMENTATION CODES:   Obesity unspecified  INTERVENTION:   Provide Boost Breeze po TID, each supplement provides 250 kcal and 9 grams of protein as tolerated  NUTRITION DIAGNOSIS:   Increased nutrient needs related to cancer and cancer related treatments, chronic illness as evidenced by estimated needs.  GOAL:   Patient will meet greater than or equal to 90% of their needs  MONITOR:   PO intake, Labs, Weight trends, I & O's, GOC  REASON FOR ASSESSMENT:   Consult Assessment of nutrition requirement/status  ASSESSMENT:   52 year old with history of HIV, cervical cancer on chemotherapy, diabetes mellitus type 2, anemia came to the hospital with lower back pain.  She notes having back pain for some time even prior to her recent chest cystostomy with stent placement for right-sided hydronephrosis and hydroureter.  Admitted for concerns for rectal abscess, progression of her poorly differentiated cervical carcinoma and possible tumor necrosis.  12/20: started chemotherapy  Patient not interested in nutrition focused conversation at this time. Pt is eating poorly, 0-25% documented in chart. Pt struggling with constipation currently with rectovaginal fistula. Will order Boost Breeze supplements.  Palliative care having ongoing conversations with pt, oncology recommending conservative management, no surgery at this time. Per Urology note, MD does not recommend ureteral stent.  Will continue to monitor [place] decisions.  Per weight history, pt has lost 28 lb since 9/17 (13% wt loss x 3 months, significant for time frame). Suspect some degree of malnutrition but will attempt diagnosis at follow-up.  Medications: Colace capsule BID, Folic acid tablet daily, MAG-OX tablet BID, Miralax packet daily Labs reviewed: Low Na GFR: 32   NUTRITION - FOCUSED PHYSICAL EXAM:  Deferred.  Diet Order:   Diet Order            Diet regular Room service appropriate?  Yes; Fluid consistency: Thin  Diet effective now              EDUCATION NEEDS:   Not appropriate for education at this time  Skin:  Skin Assessment: Reviewed RN Assessment  Last BM:  12/24- small  Height:   Ht Readings from Last 1 Encounters:  10/26/18 5\' 1"  (1.549 m)    Weight:   Wt Readings from Last 1 Encounters:  10/26/18 86 kg    Ideal Body Weight:  47.7 kg  BMI:  Body mass index is 35.82 kg/m.  Estimated Nutritional Needs:   Kcal:  1800-2000  Protein:  85-95g  Fluid:  2L/day  Clayton Bibles, MS, RD, LDN [facility] Pager: [phone] After Hours Pager: [phone]

## 2018-10-30 NOTE — Progress Notes (Signed)
We are following this patient for hydronephrosis secondary to extrinsic compression from her locally advanced cervical cancer.  The patient had a stent placed in her right ureter last week.  Her renal function did improve.  At the time of that procedure she was noted to have minimal dilation of the left collecting system.  However, there is no evidence of ureteral narrowing or blockage and I made the clinical judgment that this was clinically insignificant dilation.  Interval: The patient continues to suffer from pelvic pain.  She is awake this morning but seemingly reluctant to participate in any discussions.  She is not complaining of left-sided flank pain.  AFVSS  Intake/Output Summary (Last 24 hours) at 10/30/2018 0826 Last data filed at 10/30/2018 0600 Gross per 24 hour  Intake 442.76 ml  Output 0 ml  Net 442.76 ml   NAD - arousable/somulent Non-labored breathing Abdomen is tender Ext symmetric  Recent Labs    10/27/18 0932 10/28/18 0420 10/30/18 0507  WBC 14.0* 12.3* 11.7*  HGB 9.6* 9.6* 9.8*  HCT 30.5* 30.8* 30.8*   Recent Labs    10/27/18 0932 10/28/18 0420 10/30/18 0507  NA 137 136 133*  K 4.3 4.4 4.3  CL 110 110 107  CO2 18* 17* 18*  GLUCOSE 168* 153* 157*  BUN 42* 40* 35*  CREATININE 1.64* 1.56* 1.77*  CALCIUM 8.1* 8.4* 8.3*   No results for input(s): LABPT, INR in the last 72 hours. No results for input(s): PSA in the last 72 hours. No results for input(s): LABURIN in the last 72 hours. Results for orders placed or performed during the hospital encounter of 10/26/18  Urine culture     Status: Abnormal   Collection Time: 10/26/18  5:28 PM  Result Value Ref Range Status   Specimen Description   Final    URINE, RANDOM Performed at [facility] [address] [place], [place] [address]    Special Requests   Final    NONE Performed at [facility], [place] [address], [place], [place] [address]    Culture (A)  Final     <10,000 COLONIES/mL INSIGNIFICANT GROWTH Performed at [facility] [address] [place], [place] [address]    Report Status 10/27/2018 FINAL  Final  Culture, blood (routine x 2)     Status: None (Preliminary result)   Collection Time: 10/27/18 12:14 AM  Result Value Ref Range Status   Specimen Description   Final    BLOOD RIGHT ANTECUBITAL Performed at [facility] [address], [place], [place] [address]    Special Requests   Final    BOTTLES DRAWN AEROBIC ONLY Blood Culture adequate volume Performed at [facility] [address], [place], [place] [address]    Culture   Final    NO GROWTH 3 DAYS Performed at [facility], [place] [address], [place], [place] [address]    Report Status PENDING  Incomplete  Culture, blood (routine x 2)     Status: None (Preliminary result)   Collection Time: 10/27/18 12:14 AM  Result Value Ref Range Status   Specimen Description   Final    BLOOD LEFT ANTECUBITAL Performed at [facility] [address] [place], [place] [address]    Special Requests   Final    BOTTLES DRAWN AEROBIC AND ANAEROBIC Blood Culture adequate volume Performed at [facility] [address] [place], [place] [address]  Culture   Final    NO GROWTH 3 DAYS Performed at [facility], [place] [address], [place], [place] [address]    Report Status PENDING  Incomplete    Imp/Rec: Patient with advanced cervical cancer and seemingly poor prognosis with some improvement in her renal function after placement of the right ureteral stent.  At the time of her procedure less than a week ago the patient's left hydro-was very mild and seemingly clinically insignificant.  At this point, I am not sure that placing a stent in her left ureter would be helpful.  It certainly would create more discomfort and worsening voiding symptoms.  I favor conservative management of this and trending  her serum creatinine.  If it worsens, perhaps repeating a renal ultrasound will be helpful to see if there is any worsening dilation.  If at that time it was deemed necessary for her to have optimal renal function we will consider either stent placement or nephrostomy tube at that time.    We will continue to follow, please page on-call urologist for acute needs/questions during the holidays.

## 2018-10-30 NOTE — Progress Notes (Signed)
Pharmacy Antibiotic Note  Misty Thomas is a 52 y.o. female admitted on 10/26/2018 with perirectal and presacral abscess and possible UTI.  Pharmacy has been consulted for Cefepime dosing.  10/30/2018 Scr 1.77, CrCl ~ 41 (N) WBC 11.7 afebrile  Plan: continue Cefepime to 2g IV q12h (immunocompromised patient). Continue Metronidazole per MD.  Monitor renal function, cultures, clinical course.   Height: 5\' 1"  (154.9 cm) Weight: 189 lb 9.5 oz (86 kg) IBW/kg (Calculated) : 47.8  Temp (24hrs), Avg:98.9 F (37.2 C), Min:98.5 F (36.9 C), Max:99.5 F (37.5 C)  Recent Labs  Lab 10/25/18 1159 10/26/18 1702 10/27/18 0932 10/28/18 0420 10/30/18 0507  WBC 16.6* 13.7* 14.0* 12.3* 11.7*  CREATININE 2.19* 1.66* 1.64* 1.56* 1.77*    Estimated Creatinine Clearance: 37 mL/min (A) (by C-G formula based on SCr of 1.77 mg/dL (H)).    No Known Allergies  Antimicrobials this admission: 12/21 Cefepime >> 12/21 Metronidazole >>   Microbiology results: 12/20 UCx: insignificant growth 12/21 BCx: ngtd  Thank you for allowing pharmacy to be a part of this patient's care.   Dolly Rias RPh 10/30/2018, 7:25 AM Pager [phone]

## 2018-10-30 NOTE — Progress Notes (Signed)
Patient ID: Misty Thomas, female   DOB: [birth year], 52 y.o.   MRN: [id number]  This NP visited patient at the bedside as a follow up to  yesterday's [place]., patient's mood is flat but does engage in conversation appropriately.  She is dealing with the dense spiritual and emotional component of her disease.  I spoke in detail with her husband and daughter/Misty Thomas by telephone today regarding diagnosis, prognosis, treatment options and anticipatory care needs..  Both understand the seriousness of Misty Thomas's disease.  Patient and family are open to all offered and available medical interventions to prolong life and enhance quality.  Patient and her family face treatment option decisions, advanced directive decisions and anticipatory care needs.  Long-term prognosis is poor.  Discussed with family the importance of continued conversation with patient  and the medical providers regarding overall plan of care and treatment options,  ensuring decisions are within the context of the patients values and GOCs.  Family meeting is set for Thursday, December 26 at 3 PM  Questions and concerns addressed   Discussed with Dr Lucia Gaskins and Dr Alvy Bimler and Dr Reesa Chew  Total time spent on the unit was 60 minutes  Greater than 50% of the time was spent in counseling and coordination of care  Wadie Lessen NP  Palliative Medicine Team Team Phone # [phone] Pager [phone]

## 2018-10-30 NOTE — Progress Notes (Signed)
Assessment/Plan:  52 y.o.   H/o advanced cervical CA now with progression/early recurrence, pain, rectovaginal fistula and progression of disease  Pain:  Pain is likely due to disease progression, tumor necrosis, and no BM for several days. Continue regimen per Dr. Danella Maiers team/palliative care  Pulm: no issues  Heme: Stable  CV:  Per primary team  Prophylaxis: heparin    GI:   Tolerating po: Yes    . Rectovaginal fistula and disease progression with possible associated abcess . We have discussed diverting colostomy which is on hold for now pending recovery from recent chemo and patient wishes.  o If patient shows signs of large bowel obstruction would re-assess this plan . Constipation suspect multifactorial; starting new narcotic regimen last week plus tumor volume increase in the pelvis. Continue Miralax, milk of mag (as long as creat OK), and Colace  GU: . Per urology holding on contralateral stent for now.  FEN:  . Per primary  Endo:  Apparent diet controlled diabetes. Per Primary  ID: Possible abcess vs necrotic tumor in the cul de sac. wbc coming down with antibiotics.  Gyn/Oncology: Goal of therapy is palliative. Estimated life expectancy 12-12 months. Patient may have PDL1 options - per Dr. Alvy Bimler. I will return 11/01/18 to followup with patient.    1418/1418-01  Past Medical History:  Diagnosis Date  . Acquired pancytopenia ([place]) 03/2018  . Anemia    Mild  . Cancer associated pain   . 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 Chemotherapy induced nausea and vomiting   . Diabetes mellitus type 2, diet-controlled ([place])    pt. denies No meds  . Frequency of urination   . History of cellulitis 2018   bilateral  lower leg w/ mrsa  . History of external beam radiation therapy 02-05-2018  to 03-16-2018   cervical cancer  . History of MRSA infection 2018   w/ bilateral lower leg cellulitis  . HIV (human immunodeficiency virus infection) ([place])    asymptomatic  . Hypomagnesemia 03/2018   Severe---- takes oral magnesium and IV magnesium as needed (cancer center)  . Intermittent diarrhea    due to radiation/ chemo  . Nocturia   . Port-A-Cath in place    Power port  . Radiation burn    LOWER ABD.  04-06-2018  per is healing  . Subclinical hypothyroidism    w/ thyroiditis , dx 01-22-2018 PET scan  . Thrombocytopenia ([place])   . Wears glasses      Subjective: No BM, there is still stool "discharge" from vagina. No N/V. Flat affect.  Objective: Vital signs in last 24 hours: Temp:  [98 F (36.7 C)-99.5 F (37.5 C)] 98 F (36.7 C) (12/24 1316) Pulse Rate:  [115-122] 122 (12/24 1316) Resp:  [16-20] 20 (12/24 1316) BP: (110-135)/(83-94) 135/94 (12/24 1316) SpO2:  [100 %] 100 % (12/24 1316) Last BM Date: (PTA, pt unsure of date)  Intake/Output from previous day: 12/23 0701 - 12/24 0700 In: 442.8 [P.O.:120; IV Piggyback:322.8] Out: 0  Patient Vitals for the past 24 hrs:  Urine Occurrence Stool Descriptors  10/30/18 1323 - Brown  10/30/18 1100 1 Brown  10/30/18 0549 1 -  10/29/18 2212 1 -  10/29/18 2200 1 -  10/29/18 2124 1 -  10/29/18 1630 1 -  Physical Examination: 10/28/18 - Pelvic exam - brown vaginal drainage, suspect feculant. On bimanual exam tumor burden palpated in pouch of Douglas and posterior vagina with suspected tumor. Difficult exam in bed. On rectovaginal there is a communication between the rectum and the vagina. There is large tumor burden in the parametrium/deep posterior pelvis.   Labs: WBC/Hgb/Hct/Plts:  11.7/9.8/30.8/452 (12/24 0507) BUN/Cr/glu/ALT/AST/amyl/lip:  35/1.77/--/--/--/--/-- (12/24 0507)  CBC Latest Ref Rng & Units 10/30/2018 10/28/2018 10/27/2018   WBC 4.0 - 10.5 K/uL 11.7(H) 12.3(H) 14.0(H)  Hemoglobin 12.0 - 15.0 g/dL 9.8(L) 9.6(L) 9.6(L)  Hematocrit 36.0 - 46.0 % 30.8(L) 30.8(L) 30.5(L)  Platelets 150 - 400 K/uL 452(H) 616(H) 766(H)   BMP Latest Ref Rng & Units 10/30/2018 10/28/2018 10/27/2018  Glucose 70 - 99 mg/dL 157(H) 153(H) 168(H)  BUN 6 - 20 mg/dL 35(H) 40(H) 42(H)  Creatinine 0.44 - 1.00 mg/dL 1.77(H) 1.56(H) 1.64(H)  BUN/Creat Ratio 6 - 22 (calc) - - -  Sodium 135 - 145 mmol/L 133(L) 136 137  Potassium 3.5 - 5.1 mmol/L 4.3 4.4 4.3  Chloride 98 - 111 mmol/L 107 110 110  CO2 22 - 32 mmol/L 18(L) 17(L) 18(L)  Calcium 8.9 - 10.3 mg/dL 8.3(L) 8.4(L) 8.1(L)       LOS: 4 days    Misty Thomas 10/30/2018, 2:13 PM

## 2018-10-31 DIAGNOSIS — N179 Acute kidney failure, unspecified: Secondary | ICD-10-CM

## 2018-10-31 LAB — NA AND K (SODIUM & POTASSIUM), RAND UR
Potassium Urine: 17 mmol/L
Sodium, Ur: 81 mmol/L

## 2018-10-31 LAB — BASIC METABOLIC PANEL
Anion gap: 9 (ref 5–15)
BUN: 45 mg/dL — ABNORMAL HIGH (ref 6–20)
CO2: 18 mmol/L — ABNORMAL LOW (ref 22–32)
Calcium: 8 mg/dL — ABNORMAL LOW (ref 8.9–10.3)
Chloride: 106 mmol/L (ref 98–111)
Creatinine, Ser: 2.98 mg/dL — ABNORMAL HIGH (ref 0.44–1.00)
GFR calc non Af Amer: 17 mL/min — ABNORMAL LOW (ref >60)
GFR, EST AFRICAN AMERICAN: 20 mL/min — AB (ref >60)
Glucose, Bld: 159 mg/dL — ABNORMAL HIGH (ref 70–99)
Potassium: 4.4 mmol/L (ref 3.5–5.1)
SODIUM: 133 mmol/L — AB (ref 135–145)

## 2018-10-31 LAB — MAGNESIUM: MAGNESIUM: 2.2 mg/dL (ref 1.7–2.4)

## 2018-10-31 LAB — CREATININE, URINE, RANDOM: Creatinine, Urine: 64.33 mg/dL

## 2018-10-31 LAB — CHLORIDE, URINE, RANDOM: Chloride Urine: 71 mmol/L

## 2018-10-31 MED ORDER — SODIUM CHLORIDE 0.9 % IV SOLN
2.0000 g | INTRAVENOUS | Status: DC
  Administered 2018-10-31 – 2018-11-05 (×6): 2 g via INTRAVENOUS
  Filled 2018-10-31 (×6): qty 2

## 2018-10-31 NOTE — Progress Notes (Signed)
Urology Inpatient Progress Report  Perirectal abscess [K61.1] Thrombocytosis ([place]) [K93.8] Neutrophilic leukocytosis [H82.9] Lower abdominal pain [R10.30] Chronic anemia [D64.9]  Procedure(s): LAPAROSCOPIC DIVERTED COLOSTOMY      Intv/Subj: Creatinine increased substantially overnight from 1.77 to 2.98.  The patient is complaining about some left lower back pain which is new.  No upper left-sided back pain.  No CVA tenderness.  She has a right ureteral stent in place.  In the operating room, left retrograde pyelogram showed minimal left-sided hydronephrosis and therefore a stent was not placed.  Renal ultrasound has been ordered for reevaluation.  Patient is not currently n.p.o.  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 0.9 %  sodium chloride infusion   Intravenous PRN Amin, Jeanella Flattery, MD 10 mL/hr at 10/30/18 1500    . acetaminophen (TYLENOL) tablet 650 mg  650 mg Oral Q6H PRN Fuller Plan A, MD   650 mg at 10/30/18 0551   Or  . acetaminophen (TYLENOL) suppository 650 mg  650 mg Rectal Q6H PRN Smith, Rondell A, MD      . ceFEPIme (MAXIPIME) 2 g in sodium chloride 0.9 % 100 mL IVPB  2 g Intravenous Q24H Angela Adam, RPH      . docusate sodium (COLACE) capsule 100 mg  100 mg Oral BID Precious Haws B, MD   100 mg at 10/31/18  0934  . feeding supplement (BOOST / RESOURCE BREEZE) liquid 1 Container  1 Container Oral TID BM Amin, Jeanella Flattery, MD   1 Container at 10/31/18 1100  . folic acid (FOLVITE) tablet 1 mg  1 mg Oral Daily Amin, Ankit Chirag, MD   1 mg at 10/31/18 0935  . heparin injection 5,000 Units  5,000 Units Subcutaneous Q8H Rayburn, Kelly A, PA-C   5,000 Units at 10/31/18 0514  . levothyroxine (SYNTHROID, LEVOTHROID) tablet 25 mcg  25 mcg Oral Q0600 Norval Morton, MD   25 mcg at 10/31/18 0514  . magnesium hydroxide (MILK OF MAGNESIA) suspension 15 mL  15 mL Oral Daily Precious Haws B, MD   15 mL at 10/31/18 0935  . magnesium oxide (MAG-OX) tablet 400 mg  400 mg Oral BID Fuller Plan A, MD   400 mg at 10/31/18 0934  . metroNIDAZOLE (FLAGYL) IVPB 500 mg  500 mg Intravenous Q8H Smith, Rondell A, MD 100 mL/hr at 10/31/18 0515 500 mg at 10/31/18 0515  . morphine (MS CONTIN) 12 hr tablet 60 mg  60 mg Oral Q12H Smith, Rondell A, MD   60 mg at 10/31/18 0934  . morphine (MSIR) tablet 30 mg  30 mg Oral Q6H PRN Fuller Plan A, MD   30 mg at 10/31/18 0226  . ondansetron (ZOFRAN) tablet 4 mg  4 mg Oral Q6H PRN Fuller Plan A, MD       Or  . ondansetron (ZOFRAN) injection 4 mg  4  mg Intravenous Q6H PRN Fuller Plan A, MD      . phenazopyridine (PYRIDIUM) tablet 200 mg  200 mg Oral TID PRN Fuller Plan A, MD   200 mg at 10/29/18 1301  . polyethylene glycol (MIRALAX / GLYCOLAX) packet 17 g  17 g Oral Daily Elodia Florence., MD   17 g at 10/31/18 0934  . sodium chloride flush (NS) 0.9 % injection 10-40 mL  10-40 mL Intracatheter Q12H Smith, Rondell A, MD   20 mL at 10/31/18 0531  . sodium chloride flush (NS) 0.9 % injection 10-40 mL  10-40 mL Intracatheter PRN Fuller Plan A, MD         Objective: Vital: Vitals:   10/30/18 0547 10/30/18 1316 10/30/18 2116 10/31/18 0610  BP: 110/83 (!) 135/94 (!) 130/95 121/79  Pulse: (!) 115 (!) 122 (!) 116 (!) 101  Resp: 16 20 20 16   Temp: 99.5 F (37.5 C) 98  F (36.7 C) 98 F (36.7 C) 98.8 F (37.1 C)  TempSrc: Oral Oral Oral Oral  SpO2: 100% 100% 100% 100%  Weight:      Height:       I/Os: I/O last 3 completed shifts: In: 1656.2 [P.O.:600; I.V.:116.5; IV Piggyback:939.7] Out: 0   Physical Exam:  General: Patient is in no apparent distress Lungs: Normal respiratory effort, chest expands symmetrically. GI: The abdomen is soft, nondistended, mildly tender There is no CVA tenderness Ext: lower extremities symmetric  Lab Results: Recent Labs    10/30/18 0507  WBC 11.7*  HGB 9.8*  HCT 30.8*   Recent Labs    10/30/18 0507 10/31/18 0500  NA 133* 133*  K 4.3 4.4  CL 107 106  CO2 18* 18*  GLUCOSE 157* 159*  BUN 35* 45*  CREATININE 1.77* 2.98*  CALCIUM 8.3* 8.0*   No results for input(s): LABPT, INR in the last 72 hours. No results for input(s): LABURIN in the last 72 hours. Results for orders placed or performed during the hospital encounter of 10/26/18  Urine culture     Status: Abnormal   Collection Time: 10/26/18  5:28 PM  Result Value Ref Range Status   Specimen Description   Final    URINE, RANDOM Performed at [facility] [address], [place], [place] [address]    Special Requests   Final    NONE Performed at [facility], [place] [address], [place], [place] [address]    Culture (A)  Final    <10,000 COLONIES/mL INSIGNIFICANT GROWTH Performed at [facility] [address] [place], [place] [address]    Report Status 10/27/2018 FINAL  Final  Culture, blood (routine x 2)     Status: None (Preliminary result)   Collection Time: 10/27/18 12:14 AM  Result Value Ref Range Status   Specimen Description BLOOD RIGHT ANTECUBITAL  Final   Special Requests   Final    BOTTLES DRAWN AEROBIC ONLY Blood Culture adequate volume Performed at [facility] [address], [place], [place] [address]    Culture NO GROWTH 4 DAYS  Final   Report Status PENDING   Incomplete  Culture, blood (routine x 2)     Status: None (Preliminary result)   Collection Time: 10/27/18 12:14 AM  Result Value Ref Range Status   Specimen Description BLOOD LEFT ANTECUBITAL  Final   Special Requests   Final    BOTTLES DRAWN AEROBIC AND ANAEROBIC Blood Culture adequate volume Performed at [facility]  [facility], [place] [address] [place], [place] [address]    Culture NO GROWTH 4 DAYS  Final   Report Status PENDING  Incomplete    Studies/Results: Dg Abd 1 View  Result Date: 10/30/2018 CLINICAL DATA:  52 year old female with abdominal pain and distention for 1 day. Cervical cancer. Right double-J ureteral stent placed earlier this month for right side urologic obstruction. EXAM: ABDOMEN - 1 VIEW COMPARISON:  CT Abdomen and Pelvis 10/26/2018 and earlier. FINDINGS: Right double-J ureteral stent is stable in configuration. Numerous pelvic phleboliths redemonstrated. No nephrolithiasis on the recent CT. Non obstructed bowel gas pattern. Retained stool throughout the colon similar to the recent CT. No acute osseous abnormality identified. IMPRESSION: Stable appearance to the scout view of the recent CT Abdomen and Pelvis. Stable right double-J ureteral stent. Electronically Signed   By: Genevie Ann M.D.   On: 10/30/2018 12:42    Assessment: Acute renal insufficiency Right-sided hydronephrosis status post ureteral stent placement Advanced cervical cancer with rectovaginal fistula  Procedure(s): LAPAROSCOPIC DIVERTED COLOSTOMY,    doing well.  Plan: Agree with renal ultrasound.  Primary team has informed me that due to the patient's poor prognosis, palliative care is going to be consulted for discussion of goals of treatment.  If renal ultrasound shows left-sided hydronephrosis, recommend making her n.p.o. at midnight for possible procedure and we can discuss possible stent placement on the left if she desires.  It may be best to talk to her about this after palliative care  consult to see what her goals of care are.  Continue to follow creatinine and follow-up renal ultrasound.   Link Snuffer, MD Urology 10/31/2018, 1:33 PM

## 2018-10-31 NOTE — Progress Notes (Signed)
Pharmacy Antibiotic Note  Misty Thomas is a 52 y.o. female admitted on 10/26/2018 with perirectal and presacral abscess and possible UTI.  Pharmacy has been consulted for Cefepime dosing.  10/31/2018 Scr 2.98, CrCl ~ 76mls/min WBC 11.7 (12/24) afebrile  Plan: decrease Cefepime to 2g IV q24h (immunocompromised patient). Continue Metronidazole per MD.  Monitor renal function, cultures, clinical course.   Height: 5\' 1"  (154.9 cm) Weight: 189 lb 9.5 oz (86 kg) IBW/kg (Calculated) : 47.8  Temp (24hrs), Avg:98.3 F (36.8 C), Min:98 F (36.7 C), Max:98.8 F (37.1 C)  Recent Labs  Lab 10/25/18 1159 10/26/18 1702 10/27/18 0932 10/28/18 0420 10/30/18 0507 10/31/18 0500  WBC 16.6* 13.7* 14.0* 12.3* 11.7*  --   CREATININE 2.19* 1.66* 1.64* 1.56* 1.77* 2.98*    Estimated Creatinine Clearance: 22 mL/min (A) (by C-G formula based on SCr of 2.98 mg/dL (H)).    No Known Allergies  Antimicrobials this admission: 12/21 Cefepime >> 12/21 Metronidazole >>   Microbiology results: 12/20 UCx: insignificant growth 12/21 BCx: ngtd  Thank you for allowing pharmacy to be a part of this patient's care.   Dolly Rias RPh 10/31/2018, 8:12 AM Pager [phone]

## 2018-10-31 NOTE — Progress Notes (Signed)
PROGRESS NOTE    Misty Thomas  YBW:[id number] DOB: 20-Sep-1966 DOA: 10/26/2018 PCP: Patient, No Pcp Per   Brief Narrative:  52 year old with history of HIV, cervical cancer on chemotherapy, diabetes mellitus type 2, anemia came to the hospital with lower back pain.  She notes having back pain for some time even prior to her recent chest cystostomy with stent placement for right-sided hydronephrosis and hydroureter.  Admitted for concerns for rectal abscess, progression of her poorly differentiated cervical carcinoma and possible tumor necrosis.Overall poor prognosis. Oncology, Gen Surgery, Gyn Surgery and Palliative Care team following.   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  Oncology team is following.  At this time we will continue supportive care - Appreciate input from palliative care- plan for palliative care team meeting tomorrow afternoon.  -Supportive care, pain control - Broad-spectrum antibiotic-Cefepime/Flagyl.  -Pain control, bowel regimen -Abdominal x-ray-neg for acute pathology   Right sided Hydronephrosis s.o Stenting.  Acute kidney injury on CKD stage III -Baseline creatinine 1.7.   Today acutely looks worse and it has increased to 2.98. -Need accurate input and output - Check urine electrolytes. Will repeat Renal US - to reeval hydronephrosis.  -Urology team following.  Hypothyroidism - Synthroid 25 mcg daily  HIV -Continue home medication but today on hold due to worsening renal function.  Last CD4 count August 2019-130  Diabetes mellitus type 2 -Diet controlled.  Hemoglobin A1c 5.512/2019  Advised to sit in the chair today and ambulate with assistance.   To me she seems to be little depressed but its acceptable given everything going on with her. If she is not able to cope with this on her own, we may have to start her on a low dose medication.   DVT prophylaxis: Hep SQ Code Status: Full code Family Communication: None Disposition Plan: Maitain in patient stay until there is a clear plan in terms of malignancy. Also needs her Renal function to improve and stabilize.   Consultants:   Urology  GYN/oncology  Oncology   Antimicrobials:   Cefepime and Flagyl day 6   Subjective: Overall she is weak. Has not ambulated much yet.   Review of Systems Otherwise negative except as per HPI, including: General = no fevers, chills, dizziness, malaise, fatigue HEENT/EYES = negative for pain, redness, loss of vision, double vision, blurred vision, loss of hearing, sore throat, hoarseness, dysphagia Cardiovascular= negative for chest pain, palpitation, murmurs, lower extremity swelling Respiratory/lungs= negative for shortness of breath, cough, hemoptysis, wheezing, mucus production Gastrointestinal= negative for nausea, vomiting,, abdominal pain, melena, hematemesis Genitourinary= negative for Dysuria, Hematuria, Change in Urinary Frequency MSK = Negative for arthralgia, myalgias, Back Pain, Joint swelling  Neurology= Negative for headache, seizures, numbness, tingling  Psychiatry= Negative for anxiety, depression, suicidal and  homocidal  ideation Allergy/Immunology= Medication/Food allergy as listed  Skin= Negative for Rash, lesions, ulcers, itching    Objective: Vitals:   10/30/18 0547 10/30/18 1316 10/30/18 2116 10/31/18 0610  BP: 110/83 (!) 135/94 (!) 130/95 121/79  Pulse: (!) 115 (!) 122 (!) 116 (!) 101  Resp: 16 20 20 16   Temp: 99.5 F (37.5 C) 98 F (36.7 C) 98 F (36.7 C) 98.8 F (37.1 C)  TempSrc: Oral Oral Oral Oral  SpO2: 100% 100% 100% 100%  Weight:      Height:        Intake/Output Summary (Last 24 hours) at 10/31/2018 1104 Last data filed at 10/31/2018 0600 Gross per 24 hour  Intake 1201.97 ml  Output -  Net 1201.97 ml   Filed Weights   10/26/18 2350  Weight: 86 kg    Examination:  Constitutional: NAD, calm, comfortable Eyes: PERRL, lids and conjunctivae normal ENMT: Mucous membranes are DRY Posterior pharynx clear of any exudate or lesions.Normal dentition.  Neck: normal, supple, no masses, no thyromegaly Respiratory: clear to auscultation bilaterally, no wheezing, no crackles. Normal respiratory effort. No accessory muscle use.  Cardiovascular: Regular rate and rhythm, no murmurs / rubs / gallops. No extremity edema. 2+ pedal pulses. No carotid bruits.  Abdomen: no tenderness, no masses palpated. No hepatosplenomegaly. Bowel sounds positive.  Musculoskeletal: no clubbing / cyanosis. No joint deformity upper and lower extremities. Good ROM, no contractures. Normal muscle tone.  Skin: no rashes, lesions, ulcers. No induration Neurologic: CN 2-12 grossly intact. Sensation intact, DTR normal. Strength 5/5 in all 4.  Psychiatric: Flat affect. AAOx3     Data Reviewed:   CBC: Recent Labs  Lab 10/25/18 1159 10/26/18 1702 10/27/18 0932 10/28/18 0420 10/30/18 0507  WBC 16.6* 13.7* 14.0* 12.3* 11.7*  NEUTROABS 15.0* 12.9*  --   --   --   HGB 7.4* 7.6* 9.6* 9.6* 9.8*  HCT 23.6* 25.0* 30.5* 30.8* 30.8*  MCV 94.8 96.5 94.7 94.5 93.6  PLT 799* 683* 766* 616* 580*   Basic Metabolic  Panel: Recent Labs  Lab 10/25/18 1159 10/26/18 1702 10/27/18 0932 10/28/18 0420 10/30/18 0507 10/31/18 0500  NA 136 134* 137 136 133* 133*  K 4.5 4.4 4.3 4.4 4.3 4.4  CL 106 108 110 110 107 106  CO2 18* 16* 18* 17* 18* 18*  GLUCOSE 120* 179* 168* 153* 157* 159*  BUN 37* 36* 42* 40* 35* 45*  CREATININE 2.19* 1.66* 1.64* 1.56* 1.77* 2.98*  CALCIUM 9.8 8.4* 8.1* 8.4* 8.3* 8.0*  MG 2.0  --  2.2 2.3 2.2 2.2   GFR: Estimated Creatinine Clearance: 22 mL/min (A) (by C-G formula based on SCr of 2.98 mg/dL (H)). Liver Function Tests: Recent Labs  Lab 10/25/18 1159 10/26/18 1702 10/28/18 0420  AST 15 16 16   ALT 8 9 11   ALKPHOS 92 77 55  BILITOT 0.3 0.6 0.8  PROT 9.3* 8.3* 7.0  ALBUMIN 3.0* 3.0* 2.8*   Recent Labs  Lab 10/26/18 1702  LIPASE 33   Recent Labs  Lab 10/27/18 2053  AMMONIA 27   Coagulation Profile: No results for input(s): INR, PROTIME in the last 168 hours. Cardiac Enzymes: No results for input(s): CKTOTAL, CKMB, CKMBINDEX, TROPONINI in the last 168 hours. BNP (last 3 results) No results for input(s): PROBNP in the last 8760 hours. HbA1C: No results for input(s): HGBA1C in the last 72 hours. CBG: Recent Labs  Lab 10/24/18 1224  GLUCAP 92   Lipid Profile: No results for input(s): CHOL, HDL,  LDLCALC, TRIG, CHOLHDL, LDLDIRECT in the last 72 hours. Thyroid Function Tests: No results for input(s): TSH, T4TOTAL, FREET4, T3FREE, THYROIDAB in the last 72 hours. Anemia Panel: No results for input(s): VITAMINB12, FOLATE, FERRITIN, TIBC, IRON, RETICCTPCT in the last 72 hours. Sepsis Labs: No results for input(s): PROCALCITON, LATICACIDVEN in the last 168 hours.  Recent Results (from the past 240 hour(s))  Urine culture     Status: Abnormal   Collection Time: 10/26/18  5:28 PM  Result Value Ref Range Status   Specimen Description   Final    URINE, RANDOM Performed at [facility] [address], [place], [place] [address]    Special  Requests   Final    NONE Performed at [facility], [place] [address], [place], [place] [address]    Culture (A)  Final    <10,000 COLONIES/mL INSIGNIFICANT GROWTH Performed at [facility] [address] [place], [place] [address]    Report Status 10/27/2018 FINAL  Final  Culture, blood (routine x 2)     Status: None (Preliminary result)   Collection Time: 10/27/18 12:14 AM  Result Value Ref Range Status   Specimen Description BLOOD RIGHT ANTECUBITAL  Final   Special Requests   Final    BOTTLES DRAWN AEROBIC ONLY Blood Culture adequate volume Performed at [facility] [address], [place], [place] [address]    Culture NO GROWTH 4 DAYS  Final   Report Status PENDING  Incomplete  Culture, blood (routine x 2)     Status: None (Preliminary result)   Collection Time: 10/27/18 12:14 AM  Result Value Ref Range Status   Specimen Description BLOOD LEFT ANTECUBITAL  Final   Special Requests   Final    BOTTLES DRAWN AEROBIC AND ANAEROBIC Blood Culture adequate volume Performed at [facility] [address], [place],  [address]    Culture NO GROWTH 4 DAYS  Final   Report Status PENDING  Incomplete         Radiology Studies: Dg Abd 1 View  Result Date: 10/30/2018 CLINICAL DATA:  52 year old female with abdominal pain and distention for 1 day. Cervical cancer. Right double-J ureteral stent placed earlier this month for right side urologic obstruction. EXAM: ABDOMEN - 1 VIEW COMPARISON:  CT Abdomen and Pelvis 10/26/2018 and earlier. FINDINGS: Right double-J ureteral stent is stable in configuration. Numerous pelvic phleboliths redemonstrated. No nephrolithiasis on the recent CT. Non obstructed bowel gas pattern. Retained stool throughout the colon similar to the recent CT. No acute osseous abnormality identified. IMPRESSION: Stable appearance to the scout view of the recent CT Abdomen and Pelvis. Stable right double-J  ureteral stent. Electronically Signed   By: Genevie Ann M.D.   On: 10/30/2018 12:42        Scheduled Meds: . docusate sodium  100 mg Oral BID  . feeding supplement  1 Container Oral TID BM  . folic acid  1 mg Oral Daily  . heparin injection (subcutaneous)  5,000 Units Subcutaneous Q8H  . levothyroxine  25 mcg Oral Q0600  . magnesium hydroxide  15 mL Oral Daily  . magnesium oxide  400 mg Oral BID  . morphine  60 mg Oral Q12H  . polyethylene glycol  17 g Oral Daily  . sodium chloride flush  10-40 mL Intracatheter Q12H   Continuous Infusions: . sodium chloride 10 mL/hr at 10/30/18 1500  . ceFEPime (MAXIPIME) IV    . metronidazole 500 mg (10/31/18  0515)     LOS: 5 days   Time spent= 35 mins    Misty Bjelland Arsenio Loader, MD [facility] Pager [phone]   If 7PM-7AM, please contact night-coverage [email or web address] Password [email or web address] 10/31/2018, 11:04 AM

## 2018-10-31 NOTE — Progress Notes (Signed)
[facility] Office:  [phone] General Surgery Progress Note   LOS: 5 days  POD -     Chief Complaint: Back pain, constipation  Assessment and Plan: 1.  Locally advanced cervical cancer with rectovaginal fistula and pelvic/rectal pain   Treating physicians - Rossi/Gorsuch/Kinard  First dose of chemotx - 10/26/2018  Misty Thomas said that Misty Thomas had a couple of small BM's.  I'm not sure.  Needs to ambulate more.  2.  Left perirectal fluid collection - abscess vs necrotic tumor  WBC - 11,700 - 10/30/2018  On Cefepime/Flagyl 3.  Hypothyroidism - synthroid 4.  HIV/AIDS - last CD4 in August 130 5.  T2DM - diet controlled, A1c 5.5 10/2018 6.  R hydronephrosis/hydroureter - s/p stenting  Managed by Dr. Louis Meckel  Creatinine - 2.98 - 10/31/2018 (worsening) 7.  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  Her husband, Misty Thomas, at bedside.  I answered questions.  Objective:   Vitals:   10/30/18 2116 10/31/18 0610  BP: (!) 130/95 121/79  Pulse: (!) 116 (!) 101  Resp: 20 16  Temp: 98 F (36.7 C) 98.8 F (37.1 C)  SpO2: 100% 100%     Intake/Output from previous day:  12/24 0701 - 12/25 0700 In: 1142.1 [P.O.:480; I.V.:116.5; IV Piggyback:545.7] Out: -   Intake/Output this shift:  No intake/output data recorded.   Physical Exam:   General: Mildly obese AA F  who is alert and oriented.  But Misty Thomas always seems a little dazed to me.   HEENT: Normal. Pupils equal. .   Lungs: Clear   Abdomen: Soft.  Has BS.   Lab Results:    Recent Labs    10/30/18 0507  WBC 11.7*  HGB 9.8*  HCT 30.8*  PLT 452*    BMET   Recent Labs    10/30/18 0507 10/31/18 0500  NA 133* 133*  K 4.3 4.4  CL 107 106  CO2 18* 18*  GLUCOSE 157* 159*  BUN 35* 45*  CREATININE 1.77* 2.98*  CALCIUM 8.3* 8.0*    PT/INR  No results for input(s): LABPROT, INR in the last 72 hours.  ABG   No results for input(s): PHART, HCO3 in the last 72 hours.  Invalid input(s): PCO2, PO2   Studies/Results:  Dg Abd 1 View  Result Date: 10/30/2018 CLINICAL DATA:  52 year old female with abdominal pain and distention for 1 day. Cervical cancer. Right double-J ureteral stent placed earlier this month for right side urologic obstruction. EXAM: ABDOMEN - 1 VIEW COMPARISON:  CT Abdomen and Pelvis 10/26/2018 and earlier. FINDINGS: Right double-J ureteral stent is stable in configuration. Numerous pelvic phleboliths redemonstrated. No nephrolithiasis on the recent CT. Non obstructed bowel gas pattern. Retained stool throughout the colon similar to the recent CT. No acute osseous abnormality identified. IMPRESSION: Stable appearance to the scout view of the  recent CT Abdomen and Pelvis. Stable right double-J ureteral stent. Electronically Signed   By: Genevie Ann M.D.   On: 10/30/2018 12:42     Anti-infectives:   Anti-infectives (From admission, onward)   Start     Dose/Rate Route Frequency Ordered Stop   10/27/18 2000  ceFEPIme (MAXIPIME) 2 g in sodium chloride 0.9 % 100 mL IVPB     2 g 200 mL/hr over 30 Minutes Intravenous Every 12 hours 10/27/18 1643     10/27/18 1000  bictegravir-emtricitabine-tenofovir AF (BIKTARVY) 50-200-25 MG per tablet 1 tablet     1 tablet Oral Daily 10/26/18 2209     10/27/18 1000  ceFEPIme (MAXIPIME) 1 g in sodium chloride 0.9 % 100 mL IVPB  Status:  Discontinued      1 g 200 mL/hr over 30 Minutes Intravenous Every 12 hours 10/27/18 0335 10/27/18 1643   10/26/18 2230  metroNIDAZOLE (FLAGYL) IVPB 500 mg     500 mg 100 mL/hr over 60 Minutes Intravenous Every 8 hours 10/26/18 2212     10/26/18 2215  ceFEPIme (MAXIPIME) 2 g in sodium chloride 0.9 % 100 mL IVPB     2 g 200 mL/hr over 30 Minutes Intravenous  Once 10/26/18 2212 10/27/18 0106      Alphonsa Overall, MD, FACS Pager: [facility] Office: [phone] 10/31/2018

## 2018-11-01 ENCOUNTER — Inpatient Hospital Stay (HOSPITAL_COMMUNITY): Payer: Medicaid Other

## 2018-11-01 DIAGNOSIS — Z7189 Other specified counseling: Secondary | ICD-10-CM

## 2018-11-01 LAB — BASIC METABOLIC PANEL
ANION GAP: 10 (ref 5–15)
BUN: 56 mg/dL — ABNORMAL HIGH (ref 6–20)
CO2: 18 mmol/L — ABNORMAL LOW (ref 22–32)
Calcium: 8.4 mg/dL — ABNORMAL LOW (ref 8.9–10.3)
Chloride: 104 mmol/L (ref 98–111)
Creatinine, Ser: 3.67 mg/dL — ABNORMAL HIGH (ref 0.44–1.00)
GFR calc Af Amer: 16 mL/min — ABNORMAL LOW (ref >60)
GFR calc non Af Amer: 13 mL/min — ABNORMAL LOW (ref >60)
Glucose, Bld: 145 mg/dL — ABNORMAL HIGH (ref 70–99)
POTASSIUM: 4.7 mmol/L (ref 3.5–5.1)
Sodium: 132 mmol/L — ABNORMAL LOW (ref 135–145)

## 2018-11-01 LAB — CULTURE, BLOOD (ROUTINE X 2)
CULTURE: NO GROWTH
Culture: NO GROWTH
Special Requests: ADEQUATE
Special Requests: ADEQUATE

## 2018-11-01 LAB — MAGNESIUM: Magnesium: 2.5 mg/dL — ABNORMAL HIGH (ref 1.7–2.4)

## 2018-11-01 LAB — OSMOLALITY, URINE: Osmolality, Ur: 347 mOsm/kg (ref 300–900)

## 2018-11-01 NOTE — Progress Notes (Signed)
[facility]:  [phone] General Surgery Progress Note   LOS: 6 days  POD -     Chief Complaint: Back pain, constipation  Assessment and Plan: 1.  Locally advanced cervical cancer with rectovaginal fistula and pelvic/rectal pain   Treating physicians - Rossi/Gorsuch/Kinard  First dose of chemotx - 10/26/2018  She said that she had a BM yesterday.  2.  Left perirectal fluid collection - abscess vs necrotic tumor  WBC - 11,700 - 10/30/2018  On Cefepime/Flagyl  3.  R hydronephrosis/hydroureter - s/p stenting  Managed by Dr. Louis Meckel  Creatinine - 3.67 - 11/01/2018 (worsening)  Renal US shows left perinephric fluid - 11/01/2018  Dr. Gloriann Loan mentioned possibly placing left ureteral stent 4.  Hypothyroidism - synthroid 5.  HIV/AIDS - last CD4 in August 130 6.  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  Feels mildly bloated.  She mentally looks much better today - moved engaged.  Objective:   Vitals:   10/31/18 2202 11/01/18 0512  BP: (!) 147/87 128/83  Pulse: (!) 112 96  Resp: 20 18  Temp: 98.8 F (37.1 C) 99 F (37.2 C)  SpO2: 100% 94%     Intake/Output from previous day:  12/25 0701 - 12/26 0700 In: 785.2 [P.O.:270; I.V.:120; IV Piggyback:395.2] Out: 200 [Urine:200]  Intake/Output this shift:  No intake/output data  recorded.   Physical Exam:   General: Mildly obese AA F who is alert and oriented.  But she always seems a little dazed to me.   HEENT: Normal. Pupils equal. .   Lungs: Clear   Abdomen: Soft.  Has BS.   Lab Results:    Recent Labs    10/30/18 0507  WBC 11.7*  HGB 9.8*  HCT 30.8*  PLT 452*    BMET   Recent Labs    10/31/18 0500 11/01/18 0424  NA 133* 132*  K 4.4 4.7  CL 106 104  CO2 18* 18*  GLUCOSE 159* 145*  BUN 45* 56*  CREATININE 2.98* 3.67*  CALCIUM 8.0* 8.4*    PT/INR  No results for input(s): LABPROT, INR in the last 72 hours.  ABG   No results for input(s): PHART, HCO3 in the last 72 hours.  Invalid input(s): PCO2, PO2   Studies/Results:  Dg Abd 1 View  Result Date: 10/30/2018 CLINICAL DATA:  52 year old female with abdominal pain and distention for 1 day. Cervical cancer. Right double-J ureteral stent placed earlier this month for right side urologic obstruction. EXAM: ABDOMEN - 1 VIEW COMPARISON:  CT Abdomen and Pelvis 10/26/2018 and earlier. FINDINGS: Right double-J ureteral stent is stable in configuration. Numerous pelvic phleboliths redemonstrated. No nephrolithiasis on the recent CT. Non obstructed bowel gas pattern. Retained stool throughout the colon similar to the recent CT. No acute osseous abnormality identified. IMPRESSION: Stable appearance to the scout view  of the recent CT Abdomen and Pelvis. Stable right double-J ureteral stent. Electronically Signed   By: Genevie Ann M.D.   On: 10/30/2018 12:42   US Renal  Result Date: 11/01/2018 CLINICAL DATA:  Hydronephrosis EXAM: RENAL / URINARY TRACT ULTRASOUND COMPLETE COMPARISON:  KUB 10/30/2018.  CT abdomen pelvis 10/26/2018 FINDINGS: Right Kidney: Renal measurements: 11.5 x 6.3 x 5.9 cm = volume: 224 mL. Moderate right hydronephrosis. Right ureteral stent extends into the renal pelvis. No mass or calculi. Minimal perinephric fluid on the right. Left Kidney: Renal measurements: 12.6 x 6.2 x 6.4 cm =  volume: 263 mL. Mild left hydronephrosis. 4 cm midpole cyst. Large perinephric fluid collection with septations. This was not present on the recent CT. Bladder: Normal bladder.  Stent in the right bladder. IMPRESSION: Moderate hydronephrosis on the right with mild perinephric fluid on the right Mild left hydronephrosis with large complex perinephric fluid collection not present on CT of 10/26/2018. Question hematoma or abscess. Electronically Signed   By: Franchot Gallo M.D.   On: 11/01/2018 07:47     Anti-infectives:   Anti-infectives (From admission, onward)   Start     Dose/Rate Route Frequency Ordered Stop   10/31/18 2000  ceFEPIme (MAXIPIME) 2 g in sodium chloride 0.9 % 100 mL IVPB     2 g 200 mL/hr over 30 Minutes Intravenous Every 24 hours 10/31/18 0812     10/27/18 2000  ceFEPIme (MAXIPIME) 2 g in sodium chloride 0.9 % 100 mL IVPB  Status:  Discontinued     2 g 200 mL/hr over 30 Minutes Intravenous Every 12 hours 10/27/18 1643 10/31/18 0812   10/27/18 1000  bictegravir-emtricitabine-tenofovir AF (BIKTARVY) 50-200-25 MG per tablet 1 tablet  Status:  Discontinued     1 tablet Oral Daily 10/26/18 2209 10/31/18 0836   10/27/18 1000  ceFEPIme (MAXIPIME) 1 g in sodium chloride 0.9 % 100 mL IVPB  Status:  Discontinued     1 g 200 mL/hr over 30 Minutes Intravenous Every 12 hours 10/27/18 0335 10/27/18 1643   10/26/18 2230  metroNIDAZOLE (FLAGYL) IVPB 500 mg     500 mg 100 mL/hr over 60 Minutes Intravenous Every 8 hours 10/26/18 2212     10/26/18 2215  ceFEPIme (MAXIPIME) 2 g in sodium chloride 0.9 % 100 mL IVPB     2 g 200 mL/hr over 30 Minutes Intravenous  Once 10/26/18 2212 10/27/18 0106      Alphonsa Overall, MD, FACS Pager: [facility] Office: [phone] 11/01/2018

## 2018-11-01 NOTE — Progress Notes (Signed)
Patient ID: Misty [facility], female   DOB: [birth year], 52 y.o.   MRN: [id number]  This NP visited patient at the bedside as a follow up to  yesterday's [place].  Meet with family, husband and daughter Misty Thomas for scheduled meeting regarding current medical situation; diagnosis, prognosis, treatment options, advance directives and anticipatory care needs.  Both patient and her family understand the seriousness of her medical situation. Patient is hopeful for continued quality life and is open to all offered and available medical interventions that may support that goal.  We did discuss the limitations of medical interventions to prolong quality of life when a body does not respond to treatment and begins to fail to thrive, and we discussed human mortality.  Patient is able to verbalize that quality and dignity are a priority for her.  Today she considered a DNR/DNI status but her daughter encouraged her to take some more time with that decisions. She is able to tell her family that she would not want aggressive measures if there was nothing more to offer.  We discussed hospice benefit.   Discussed with patient the importance of continued conversation with family and the medical providers regarding overall plan of care and treatment options,  ensuring decisions are within the context of the patients values and GOCs.  Questions and concerns addressed   Discussed with Dr Reesa Chew  Total time spent on the unit was 60 minutes   PMT will continue to support holistically.  I made Misty Thomas and Dr Reesa Chew aware that I would not be in the hospital again until Monday but  any palliative need can be addressed by calling team number and asking for a provider. I will f/u on Monday.  Greater than 50% of the time was spent in counseling and coordination of care  Wadie Lessen NP  Palliative Medicine Team Team Phone # [phone] Pager [phone]

## 2018-11-01 NOTE — Progress Notes (Signed)
Urology Inpatient Progress Report  Perirectal abscess [K61.1] Thrombocytosis ([place]) [T70.1] Neutrophilic leukocytosis [X79.3] Lower abdominal pain [R10.30] Chronic anemia [D64.9]  Procedure(s): LAPAROSCOPIC DIVERTED COLOSTOMY      Intv/Subj: Creatinine increased substantially overnight from 2.98 to 3.67.   No CVA tenderness.  She has a right ureteral stent in place.  Ct scan showed left perinephric fluid collection concerning for forniceal rupture and mild to moderate right hydronephrosis with a stent in place.  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 0.9 %  sodium chloride infusion   Intravenous PRN Amin, Jeanella Flattery, MD 10 mL/hr at 10/30/18 1500    . acetaminophen (TYLENOL) tablet 650 mg  650 mg Oral Q6H PRN Fuller Plan A, MD   650 mg at 10/30/18 0551   Or  . acetaminophen (TYLENOL) suppository 650 mg  650 mg Rectal Q6H PRN Fuller Plan A, MD      . ceFEPIme (MAXIPIME) 2 g in sodium chloride 0.9 % 100 mL IVPB  2 g Intravenous Q24H Angela Adam, [facility]   Stopped at 10/31/18 2054  . docusate sodium (COLACE) capsule 100 mg  100 mg Oral BID Precious Haws B, MD   100 mg at 11/01/18 0813  . feeding supplement (BOOST / RESOURCE BREEZE) liquid 1 Container  1 Container Oral TID BM Amin, Jeanella Flattery, MD   1 Container at 11/01/18 1449  . folic acid (FOLVITE) tablet 1 mg  1 mg Oral Daily Amin, Ankit Chirag, MD   1 mg at 11/01/18 0813  . heparin injection 5,000 Units  5,000 Units Subcutaneous Q8H Rayburn, Kelly A, PA-C   5,000 Units at 11/01/18 1449  . levothyroxine (SYNTHROID, LEVOTHROID) tablet 25 mcg  25 mcg Oral Q0600 Norval Morton, MD   25 mcg at 11/01/18 0518  . magnesium oxide (MAG-OX) tablet 400 mg  400 mg Oral BID Fuller Plan A, MD   400 mg at 11/01/18 0813  . metroNIDAZOLE (FLAGYL) IVPB 500 mg  500 mg Intravenous Q8H Smith, Rondell A, MD 100 mL/hr at 11/01/18 1430 500 mg at 11/01/18 1430  . morphine (MS CONTIN) 12 hr tablet 60 mg  60 mg Oral Q12H Smith, Rondell A, MD   60 mg at 11/01/18 9030  . morphine (MSIR) tablet 30 mg  30 mg Oral Q6H PRN Fuller Plan A, MD   30 mg at 11/01/18 1450  . ondansetron (ZOFRAN) tablet 4 mg  4 mg Oral Q6H PRN Fuller Plan A, MD       Or  . ondansetron (ZOFRAN) injection 4 mg  4 mg Intravenous Q6H PRN Smith, Rondell A, MD      . polyethylene glycol (MIRALAX / GLYCOLAX) packet 17 g  17 g Oral Daily Elodia Florence., MD   17 g at 11/01/18 [phone]  . sodium chloride flush (NS) 0.9 %  injection 10-40 mL  10-40 mL Intracatheter Q12H Smith, Rondell A, MD   10 mL at 11/01/18 1005  . sodium chloride flush (NS) 0.9 % injection 10-40 mL  10-40 mL Intracatheter PRN Norval Morton, MD         Objective: Vital: Vitals:   10/31/18 1519 10/31/18 2202 11/01/18 0512 11/01/18 1420  BP: 120/78 (!) 147/87 128/83 127/84  Pulse: (!) 106 (!) 112 96 95  Resp:  20 18 18   Temp: 99.2 F (37.3 C) 98.8 F (37.1 C) 99 F (37.2 C) 98.4 F (36.9 C)  TempSrc: Oral Oral Oral Oral  SpO2: 98% 100% 94% 96%  Weight:      Height:       I/Os: I/O last 3 completed shifts: In: 55 [P.O.:510; I.V.:171.8; IV Piggyback:830.3] Out: 200 [Urine:200]  Physical Exam:  General: Patient is in no apparent distress Lungs: Normal respiratory effort, chest expands  symmetrically. GI: The abdomen is soft, nondistended, mildly tender There is no CVA tenderness Ext: lower extremities symmetric  Lab Results: Recent Labs    10/30/18 0507  WBC 11.7*  HGB 9.8*  HCT 30.8*   Recent Labs    10/30/18 0507 10/31/18 0500 11/01/18 0424  NA 133* 133* 132*  K 4.3 4.4 4.7  CL 107 106 104  CO2 18* 18* 18*  GLUCOSE 157* 159* 145*  BUN 35* 45* 56*  CREATININE 1.77* 2.98* 3.67*  CALCIUM 8.3* 8.0* 8.4*   No results for input(s): LABPT, INR in the last 72 hours. No results for input(s): LABURIN in the last 72 hours. Results for orders placed or performed during the hospital encounter of 10/26/18  Urine culture     Status: Abnormal   Collection Time: 10/26/18  5:28 PM  Result Value Ref Range Status   Specimen Description   Final    URINE, RANDOM Performed at [facility] [address], [place], [place] [address]    Special Requests   Final    NONE Performed at [facility], [place] [address], [place], [place] [address]    Culture (A)  Final    <10,000 COLONIES/mL INSIGNIFICANT GROWTH Performed at [facility] [address], [place], [place] [address]    Report Status 10/27/2018 FINAL  Final  Culture, blood (routine x 2)     Status: None   Collection Time: 10/27/18 12:14 AM  Result Value Ref Range Status   Specimen Description   Final    BLOOD RIGHT ANTECUBITAL Performed at [facility] [address], [place], [place] [address]    Special Requests   Final    BOTTLES DRAWN AEROBIC ONLY Blood Culture adequate volume Performed at [facility] [address], [place], [place] [address]    Culture   Final    NO GROWTH 5 DAYS Performed at [facility], [place] [address] [place], [place] [address]    Report Status 11/01/2018 FINAL  Final  Culture, blood (routine x 2)     Status: None   Collection Time: 10/27/18 12:14 AM  Result Value Ref Range Status    Specimen Description   Final    BLOOD LEFT ANTECUBITAL Performed at [facility] [address] [place], [place] [address]    Special Requests   Final    BOTTLES DRAWN AEROBIC AND ANAEROBIC Blood Culture adequate volume Performed at [facility] [address], [place],  [address]    Culture  Final    NO GROWTH 5 DAYS Performed at [facility], [place] [address], [place], [place] [address]    Report Status 11/01/2018 FINAL  Final    Studies/Results: Ct Abdomen Pelvis Wo Contrast  Result Date: 11/01/2018 CLINICAL DATA:  HIV, cervical cancer on chemotherapy. Lower back pain. Hydronephrosis with worsening renal function. EXAM: CT ABDOMEN AND PELVIS WITHOUT CONTRAST TECHNIQUE: Multidetector CT imaging of the abdomen and pelvis was performed following the standard protocol without IV contrast. COMPARISON:  10/26/2018. FINDINGS: Lower chest: Small left pleural effusion with collapse/consolidation in the left lower lobe, new from 10/26/2018. Trace right pleural fluid. Decreased attenuation of the intravascular compartment is indicative of anemia. Heart size normal. No pericardial effusion. Hepatobiliary: Liver and gallbladder are unremarkable. No biliary ductal dilatation. Pancreas: Negative. Spleen: Negative. Adrenals/Urinary Tract: Adrenal glands are unremarkable. Double-J right ureteral stent is seen with the proximal loop formed in the right renal pelvis and distal loop formed in the bladder. Mild to moderate residual right hydronephrosis, similar to 10/26/2018. Extensive left perinephric fluid, new from 10/26/2018. Moderate left hydronephrosis. Fluid density lesion in the left kidney measures 4.1 cm, as before. Bladder is grossly unremarkable. Stomach/Bowel: Stomach and small bowel are unremarkable. Appendix is not readily visualized. Stool is seen throughout the colon. Vascular/Lymphatic: Vascular structures are grossly unremarkable. No definite  pathologically enlarged lymph nodes. Reproductive: Uterus is visualized. Assess in the patient's cervical cancer is limited without IV contrast. Other: Left perirectal/presacral fluid collection measures approximately 2.7 x 3.7 cm, stable. Associated presacral thickening. Musculoskeletal: Degenerative changes in the spine. No worrisome lytic or sclerotic lesions. IMPRESSION: 1. Large left perinephric fluid collection is new from 10/26/2018 and most likely represents a urinoma related to hydronephrosis and forniceal rupture. 2. Mild to moderate right hydronephrosis with double-J ureteral stent in place. 3. Left perirectal/presacral fluid collection is grossly stable. Abscess is not excluded. 4. New small left pleural effusion. Associated collapse/consolidation in the left lower lobe is likely due to atelectasis. Pneumonia is not excluded. 5. Stool throughout the colon is indicative of constipation. Electronically Signed   By: Lorin Picket M.D.   On: 11/01/2018 16:38   US Renal  Result Date: 11/01/2018 CLINICAL DATA:  Hydronephrosis EXAM: RENAL / URINARY TRACT ULTRASOUND COMPLETE COMPARISON:  KUB 10/30/2018.  CT abdomen pelvis 10/26/2018 FINDINGS: Right Kidney: Renal measurements: 11.5 x 6.3 x 5.9 cm = volume: 224 mL. Moderate right hydronephrosis. Right ureteral stent extends into the renal pelvis. No mass or calculi. Minimal perinephric fluid on the right. Left Kidney: Renal measurements: 12.6 x 6.2 x 6.4 cm = volume: 263 mL. Mild left hydronephrosis. 4 cm midpole cyst. Large perinephric fluid collection with septations. This was not present on the recent CT. Bladder: Normal bladder.  Stent in the right bladder. IMPRESSION: Moderate hydronephrosis on the right with mild perinephric fluid on the right Mild left hydronephrosis with large complex perinephric fluid collection not present on CT of 10/26/2018. Question hematoma or abscess. Electronically Signed   By: Franchot Gallo M.D.   On: 11/01/2018 07:47     Assessment: Acute renal insufficiency Right-sided hydronephrosis status post ureteral stent placement Advanced cervical cancer with rectovaginal fistula  Procedure(s): LAPAROSCOPIC DIVERTED COLOSTOMY,    doing well.  Plan: The patient continues to have worsening renal function and new left urinoma likely due to forniceal rupture. The patient would benefit from drainage of her left collecting system with a ureteral stent versus a nephrostomy tube. Since her pelvic mass is rapidly progressing from 1 week  ago when the right ureteral stent was placed placement of bilateral ureteral stents would not be beneficial and would likely obstruct. I discussed bilateral nephrostomy tube placement with the patient and she wishes to talk to palliative care prior to making any further decisions.    Link Snuffer, MD Urology 11/01/2018, 5:15 PM

## 2018-11-01 NOTE — Progress Notes (Signed)
LLOYD CULLINAN   DOB:November 28, 1965   UR#:[id number]    Assessment & Plan:  Recurrent, locally advanced cervical cancer She has received first dose of chemotherapy on October 26, 2018. I expect her blood count will nadir at the end of the week Continue supportive care  Necrotic tumor with vaginal/rectal fistula, probable infection/abscess It is hard to interpret her imaging study but the patient likely have necrotic tumor complicated by fistula. She has received broad-spectrum IV antibiotics I had extensive discussion with GYN oncologist and general surgery on 12/23 about the role of palliative, diversion colostomy. She would be at very high risk of complication with infection and probable wound healing given prior history of radiation, recent chemotherapy, background history of HIV infection and protein calorie malnutrition. For now, I favor conservative management with IV antibiotics and supportive care  HIV infection She will continue antiretroviral treatment  Severe pelvic pain, improved Due to infection, disease and fistula She will continue to receive intermittent doses of pain medicine as needed  Altered mental status/confusion, somewhat sedated, resolved Suspect it could be related to pain medicine. Recent CT imaging showedno evidence of intracranial metastasis  Severe constipation, resolved Multifactorial, could be due to disease and side effects of medication Recommend continue on aggressive laxative therapy  Acute on chronic renal failure Due to hydronephrosis and disease, or possibility would include side effects of antibiotic treatment or recent CT scan with contrast She had stent placement recently. Slightly worse, question related to poor oral intake.  Consider IV fluids if indicated.  Will defer to primary service.  Consider nephrology consult  Chronic anemia Due to recent renal disease and chemotherapy. Transfuse as needed if hemoglobin is less than  8  Moderate protein calorie malnutrition Due to her disease Nutritional supplement as needed  Goals of care discussion She had numerous discussionswith surgicalteams In my opinion, I would favor conservative management right now rather than diversion colostomy due to high risk of complication.  Discharge planning Not ready for discharge due to worsening renal failure. Will follow.  [name], MD 11/01/2018  8:42 AM   Subjective:  She looks well this morning.  She was sitting up.  She denies pain.  She had bowel movement over the last 2 days.  No nausea.  Objective:  Vitals:   10/31/18 2202 11/01/18 0512  BP: (!) 147/87 128/83  Pulse: (!) 112 96  Resp: 20 18  Temp: 98.8 F (37.1 C) 99 F (37.2 C)  SpO2: 100% 94%     Intake/Output Summary (Last 24 hours) at 11/01/2018 2831 Last data filed at 11/01/2018 0600 Gross per 24 hour  Intake 785.15 ml  Output 200 ml  Net 585.15 ml    GENERAL:alert, no distress and comfortable SKIN: skin color, texture, turgor are normal, no rashes or significant lesions EYES: normal, Conjunctiva are pink and non-injected, sclera clear OROPHARYNX:no exudate, no erythema and lips, buccal mucosa, and tongue normal  NECK: supple, thyroid normal size, non-tender, without nodularity LYMPH:  no palpable lymphadenopathy in the cervical, axillary or inguinal LUNGS: clear to auscultation and percussion with normal breathing effort HEART: regular rate & rhythm and no murmurs and no lower extremity edema ABDOMEN:abdomen soft, non-tender and normal bowel sounds Musculoskeletal:no cyanosis of digits and no clubbing  NEURO: alert & oriented x 3 with fluent speech, no focal motor/sensory deficits   Labs:  Lab Results  Component Value Date   WBC 11.7 (H) 10/30/2018   HGB 9.8 (L) 10/30/2018   HCT 30.8 (L) 10/30/2018  MCV 93.6 10/30/2018   PLT 452 (H) 10/30/2018   NEUTROABS 12.9 (H) 10/26/2018    Lab Results  Component Value Date   NA 132  (L) 11/01/2018   K 4.7 11/01/2018   CL 104 11/01/2018   CO2 18 (L) 11/01/2018    Studies:  Dg Abd 1 View  Result Date: 10/30/2018 CLINICAL DATA:  52 year old female with abdominal pain and distention for 1 day. Cervical cancer. Right double-J ureteral stent placed earlier this month for right side urologic obstruction. EXAM: ABDOMEN - 1 VIEW COMPARISON:  CT Abdomen and Pelvis 10/26/2018 and earlier. FINDINGS: Right double-J ureteral stent is stable in configuration. Numerous pelvic phleboliths redemonstrated. No nephrolithiasis on the recent CT. Non obstructed bowel gas pattern. Retained stool throughout the colon similar to the recent CT. No acute osseous abnormality identified. IMPRESSION: Stable appearance to the scout view of the recent CT Abdomen and Pelvis. Stable right double-J ureteral stent. Electronically Signed   By: Genevie Ann M.D.   On: 10/30/2018 12:42   US Renal  Result Date: 11/01/2018 CLINICAL DATA:  Hydronephrosis EXAM: RENAL / URINARY TRACT ULTRASOUND COMPLETE COMPARISON:  KUB 10/30/2018.  CT abdomen pelvis 10/26/2018 FINDINGS: Right Kidney: Renal measurements: 11.5 x 6.3 x 5.9 cm = volume: 224 mL. Moderate right hydronephrosis. Right ureteral stent extends into the renal pelvis. No mass or calculi. Minimal perinephric fluid on the right. Left Kidney: Renal measurements: 12.6 x 6.2 x 6.4 cm = volume: 263 mL. Mild left hydronephrosis. 4 cm midpole cyst. Large perinephric fluid collection with septations. This was not present on the recent CT. Bladder: Normal bladder.  Stent in the right bladder. IMPRESSION: Moderate hydronephrosis on the right with mild perinephric fluid on the right Mild left hydronephrosis with large complex perinephric fluid collection not present on CT of 10/26/2018. Question hematoma or abscess. Electronically Signed   By: Franchot Gallo M.D.   On: 11/01/2018 07:47

## 2018-11-01 NOTE — Progress Notes (Addendum)
PROGRESS NOTE    Misty Thomas  TTS:[id number] DOB: Oct 23, 1966 DOA: 10/26/2018 PCP: Patient, No Pcp Per   Brief Narrative:  52 year old with history of HIV, cervical cancer on chemotherapy, diabetes mellitus type 2, anemia came to the hospital with lower back pain.  She notes having back pain for some time even prior to her recent chest cystostomy with stent placement for right-sided hydronephrosis and hydroureter.  Admitted for concerns for rectal abscess, progression of her poorly differentiated cervical carcinoma and possible tumor necrosis.Overall poor prognosis. Oncology, Gen Surgery, Gyn Surgery and Palliative Care team following.  Patient continues to have worsening of the renal function.   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  Holding off on surgical intervention.  Oncology team is following.  Plans for family meeting this afternoon. -Appreciate palliative care team input and organizing the meeting. -Supportive care, pain control - Broad-spectrum antibiotic-Cefepime/Flagyl.  -Pain control, bowel regimen -Abdominal x-ray-neg for acute pathology   Right sided Hydronephrosis s.o Stenting.  Acute kidney  injury on CKD stage III -Baseline creatinine 1.7.  Renal function continues to worsen.  Today's 3.67.  Wonder if this is due to worsening of hydronephrosis versus ATN from contrast or medication related. -Need accurate input and output - Renal ultrasound-moderate right-sided hydro-, mild left-sided hydro-with large complex perinephric fluid collection-hematoma versus abscess. -Discussed with Dr Pascal Lux from IR- will obtain CT A/P without contrast.  This should give [name] a better visualization of the area and what needs to be done in terms of rest stent or further interventional procedure. -Urology team following.  Hypothyroidism - Synthroid 25 mcg daily  HIV -Continue home medication but today on hold due to worsening renal function.  Last CD4 count August 2019-130  Diabetes mellitus type 2 -Diet controlled.  Hemoglobin A1c 5.512/2019  Advised to sit in the chair today and ambulate with assistance.   To me she seems to be little depressed but its acceptable given everything going on with her. If she is not able to cope with this on her own, we may have to start her on a low dose medication.   DVT prophylaxis: Hep SQ Code Status: Full code Family Communication: None Disposition Plan: Maintain inpatient stay until improvement of renal function.  Consultants:   Urology  GYN/oncology  Oncology   Antimicrobials:   Cefepime and Flagyl day 7   Subjective: Laying in the bed overall appears well and does not have any complaints.  Review of Systems Otherwise negative except as per HPI, including: General = no fevers, chills, dizziness, malaise, fatigue HEENT/EYES = negative for pain, redness, loss of vision, double vision, blurred vision, loss of hearing, sore throat, hoarseness, dysphagia Cardiovascular= negative for chest pain, palpitation, murmurs, lower extremity swelling Respiratory/lungs= negative for  shortness of breath, cough, hemoptysis, wheezing, mucus  production Gastrointestinal= negative for nausea, vomiting,, abdominal pain, melena, hematemesis Genitourinary= negative for Dysuria, Hematuria, Change in Urinary Frequency MSK = Negative for arthralgia, myalgias, Back Pain, Joint swelling  Neurology= Negative for headache, seizures, numbness, tingling  Psychiatry= Negative for anxiety, depression, suicidal and homocidal ideation Allergy/Immunology= Medication/Food allergy as listed  Skin= Negative for Rash, lesions, ulcers, itching     Objective: Vitals:   10/31/18 0610 10/31/18 1519 10/31/18 2202 11/01/18 0512  BP: 121/79 120/78 (!) 147/87 128/83  Pulse: (!) 101 (!) 106 (!) 112 96  Resp: 16  20 18   Temp: 98.8 F (37.1 C) 99.2 F (37.3 C) 98.8 F (37.1 C) 99 F (37.2 C)  TempSrc: Oral Oral Oral Oral  SpO2: 100% 98% 100% 94%  Weight:      Height:        Intake/Output Summary (Last 24 hours) at 11/01/2018 1317 Last data filed at 11/01/2018 1000 Gross per 24 hour  Intake 938.73 ml  Output 200 ml  Net 738.73 ml   Filed Weights   10/26/18 2350  Weight: 86 kg    Examination: Constitutional: NAD, calm, comfortable Eyes: PERRL, lids and conjunctivae normal ENMT: Mucous membranes are moist. Posterior pharynx clear of any exudate or lesions.Normal dentition.  Neck: normal, supple, no masses, no thyromegaly Respiratory: clear to auscultation bilaterally, no wheezing, no crackles. Normal respiratory effort. No accessory muscle use.  Cardiovascular: Regular rate and rhythm, no murmurs / rubs / gallops. No extremity edema. 2+ pedal pulses. No carotid bruits.  Abdomen: no tenderness, no masses palpated. No hepatosplenomegaly. Bowel sounds positive.  Musculoskeletal: no clubbing / cyanosis. No joint deformity upper and lower extremities. Good ROM, no contractures. Normal muscle tone.  Skin: no rashes, lesions, ulcers. No induration Neurologic: CN 2-12 grossly intact. Sensation intact, DTR normal. Strength 5/5 in all 4.   Psychiatric: Flat affect.     Data Reviewed:   CBC: Recent Labs  Lab 10/26/18 1702 10/27/18 0932 10/28/18 0420 10/30/18 0507  WBC 13.7* 14.0* 12.3* 11.7*  NEUTROABS 12.9*  --   --   --   HGB 7.6* 9.6* 9.6* 9.8*  HCT 25.0* 30.5* 30.8* 30.8*  MCV 96.5 94.7 94.5 93.6  PLT 683* 766* 616* 637*   Basic Metabolic Panel: Recent Labs  Lab 10/27/18 0932 10/28/18 0420 10/30/18 0507 10/31/18 0500 11/01/18 0424  NA 137 136 133* 133* 132*  K 4.3 4.4 4.3 4.4 4.7  CL 110 110 107 106 104  CO2 18* 17* 18* 18* 18*  GLUCOSE 168* 153* 157* 159* 145*  BUN 42* 40* 35* 45* 56*  CREATININE 1.64* 1.56* 1.77* 2.98* 3.67*  CALCIUM 8.1* 8.4* 8.3* 8.0* 8.4*  MG 2.2 2.3 2.2 2.2 2.5*   GFR: Estimated Creatinine Clearance: 17.9 mL/min (A) (by C-G formula based on SCr of 3.67 mg/dL (H)). Liver Function Tests: Recent Labs  Lab 10/26/18 1702 10/28/18 0420  AST 16 16  ALT 9 11  ALKPHOS 77 55  BILITOT 0.6 0.8  PROT 8.3* 7.0  ALBUMIN 3.0* 2.8*   Recent Labs  Lab 10/26/18 1702  LIPASE 33   Recent Labs  Lab 10/27/18 2053  AMMONIA 27   Coagulation Profile: No results for input(s): INR, PROTIME in the last 168 hours. Cardiac Enzymes: No results for input(s): CKTOTAL, CKMB, CKMBINDEX, TROPONINI in the last 168 hours. BNP (last 3 results) No results for input(s): PROBNP in the last 8760 hours. HbA1C: No results for input(s): HGBA1C in the last 72  hours. CBG: No results for input(s): GLUCAP in the last 168 hours. Lipid Profile: No results for input(s): CHOL, HDL, LDLCALC, TRIG, CHOLHDL, LDLDIRECT in the last 72 hours. Thyroid Function Tests: No results for input(s): TSH, T4TOTAL, FREET4, T3FREE, THYROIDAB in the last 72 hours. Anemia Panel: No results for input(s): VITAMINB12, FOLATE, FERRITIN, TIBC, IRON, RETICCTPCT in the last 72 hours. Sepsis Labs: No results for input(s): PROCALCITON, LATICACIDVEN in the last 168 hours.  Recent Results (from the past 240 hour(s))  Urine  culture     Status: Abnormal   Collection Time: 10/26/18  5:28 PM  Result Value Ref Range Status   Specimen Description   Final    URINE, RANDOM Performed at [facility] [address] [place], [place] [address]    Special Requests   Final    NONE Performed at [facility], [place] [address] [place], [place] [address]    Culture (A)  Final    <10,000 COLONIES/mL INSIGNIFICANT GROWTH Performed at [facility] [address] [place], [place] [address]    Report Status 10/27/2018 FINAL  Final  Culture, blood (routine x 2)     Status: None   Collection Time: 10/27/18 12:14 AM  Result Value Ref Range Status   Specimen Description   Final    BLOOD RIGHT ANTECUBITAL Performed at [facility] [address] [place], [place] [address]    Special Requests   Final    BOTTLES DRAWN AEROBIC ONLY Blood Culture adequate volume Performed at [facility] [address] [place], [place] [address]    Culture   Final    NO GROWTH 5 DAYS Performed at [facility], [place] [address], [place], [place] [address]    Report Status 11/01/2018 FINAL  Final  Culture, blood (routine x 2)     Status: None   Collection Time: 10/27/18 12:14 AM  Result Value Ref Range Status   Specimen Description   Final    BLOOD LEFT ANTECUBITAL Performed at [facility] [address], [place], [place] [address]    Special Requests   Final    BOTTLES DRAWN AEROBIC AND ANAEROBIC Blood Culture adequate volume Performed at [facility] [address], [place], [place] [address]    Culture   Final    NO GROWTH 5 DAYS Performed at [facility], [place] [address], [place], [place] [address]    Report Status 11/01/2018 FINAL  Final         Radiology Studies: US Renal  Result Date: 11/01/2018 CLINICAL DATA:  Hydronephrosis EXAM: RENAL / URINARY TRACT ULTRASOUND COMPLETE COMPARISON:   KUB 10/30/2018.  CT abdomen pelvis 10/26/2018 FINDINGS: Right Kidney: Renal measurements: 11.5 x 6.3 x 5.9 cm = volume: 224 mL. Moderate right hydronephrosis. Right ureteral stent extends into the renal pelvis. No mass or calculi. Minimal perinephric fluid on the right. Left Kidney: Renal measurements: 12.6 x 6.2 x 6.4 cm = volume: 263 mL. Mild left hydronephrosis. 4 cm midpole cyst. Large perinephric fluid collection with septations. This was not present on the recent CT. Bladder: Normal bladder.  Stent in the right bladder. IMPRESSION: Moderate hydronephrosis on the right with mild perinephric fluid on the right Mild left hydronephrosis with large complex perinephric fluid collection not present on CT of 10/26/2018. Question hematoma or abscess. Electronically Signed   By: Franchot Gallo M.D.   On: 11/01/2018 07:47  Scheduled Meds: . docusate sodium  100 mg Oral BID  . feeding supplement  1 Container Oral TID BM  . folic acid  1 mg Oral Daily  . heparin injection (subcutaneous)  5,000 Units Subcutaneous Q8H  . levothyroxine  25 mcg Oral Q0600  . magnesium oxide  400 mg Oral BID  . morphine  60 mg Oral Q12H  . polyethylene glycol  17 g Oral Daily  . sodium chloride flush  10-40 mL Intracatheter Q12H   Continuous Infusions: . sodium chloride 10 mL/hr at 10/30/18 1500  . ceFEPime (MAXIPIME) IV Stopped (10/31/18 2054)  . metronidazole 500 mg (11/01/18 0520)     LOS: 6 days   Time spent= 40 mins    Rubby Barbary Arsenio Loader, MD [facility] Pager [phone]   If 7PM-7AM, please contact night-coverage [email or web address] Password [email or web address] 11/01/2018, 1:17 PM

## 2018-11-01 NOTE — Progress Notes (Signed)
   11/01/18 1400  Clinical Encounter Type  Visited With Patient  Visit Type Initial  Referral From Palliative care team  Consult/Referral To Chaplain  The chaplain responded to PMT consult for spiritual care.  The chaplain checked in with RN-Ashley upon arrival on the unit for Pt. updates.  The RN informed the chaplain the Pt. Was displaying a flat affect. The Pt. was sleeping at time of chaplain visit.  The Pt. requested the chaplain return on Friday afternoon for spiritual care.

## 2018-11-02 DIAGNOSIS — Z7189 Other specified counseling: Secondary | ICD-10-CM

## 2018-11-02 DIAGNOSIS — K611 Rectal abscess: Secondary | ICD-10-CM

## 2018-11-02 LAB — BASIC METABOLIC PANEL
Anion gap: 10 (ref 5–15)
BUN: 60 mg/dL — ABNORMAL HIGH (ref 6–20)
CO2: 16 mmol/L — ABNORMAL LOW (ref 22–32)
Calcium: 8.5 mg/dL — ABNORMAL LOW (ref 8.9–10.3)
Chloride: 107 mmol/L (ref 98–111)
Creatinine, Ser: 3.55 mg/dL — ABNORMAL HIGH (ref 0.44–1.00)
GFR calc Af Amer: 16 mL/min — ABNORMAL LOW (ref >60)
GFR calc non Af Amer: 14 mL/min — ABNORMAL LOW (ref >60)
Glucose, Bld: 118 mg/dL — ABNORMAL HIGH (ref 70–99)
Potassium: 4.8 mmol/L (ref 3.5–5.1)
SODIUM: 133 mmol/L — AB (ref 135–145)

## 2018-11-02 LAB — GLUCOSE, CAPILLARY: Glucose-Capillary: 103 mg/dL — ABNORMAL HIGH (ref 70–99)

## 2018-11-02 LAB — MAGNESIUM: Magnesium: 2.2 mg/dL (ref 1.7–2.4)

## 2018-11-02 MED ORDER — TBO-FILGRASTIM 300 MCG/0.5ML ~~LOC~~ SOSY
300.0000 ug | PREFILLED_SYRINGE | Freq: Every day | SUBCUTANEOUS | Status: DC
  Administered 2018-11-03 – 2018-11-04 (×2): 300 ug via SUBCUTANEOUS
  Filled 2018-11-02 (×3): qty 0.5

## 2018-11-02 MED ORDER — HEPARIN SODIUM (PORCINE) 5000 UNIT/ML IJ SOLN
5000.0000 [IU] | Freq: Three times a day (TID) | INTRAMUSCULAR | Status: AC
  Administered 2018-11-03: 5000 [IU] via SUBCUTANEOUS
  Filled 2018-11-02: qty 1

## 2018-11-02 MED ORDER — SENNOSIDES-DOCUSATE SODIUM 8.6-50 MG PO TABS
1.0000 | ORAL_TABLET | Freq: Two times a day (BID) | ORAL | Status: DC
  Administered 2018-11-02 – 2018-11-08 (×13): 1 via ORAL
  Filled 2018-11-02 (×14): qty 1

## 2018-11-02 MED ORDER — HYDROMORPHONE HCL 1 MG/ML IJ SOLN
0.5000 mg | INTRAMUSCULAR | Status: DC | PRN
  Administered 2018-11-02 – 2018-11-04 (×7): 0.5 mg via INTRAVENOUS
  Filled 2018-11-02 (×7): qty 0.5

## 2018-11-02 NOTE — Progress Notes (Signed)
   11/02/18 1155  Clinical Encounter Type  Visited With Patient and family together  Visit Type Follow-up  Referral From Palliative care team  Consult/Referral To Chaplain  The chaplain followed up with Pt. spiritual care visit.  The Pt. daughter was still present with the Pt. at time of chaplain visit.  The Pt. chose to delay spiritual care visit to another time.

## 2018-11-02 NOTE — Progress Notes (Signed)
Misty Thomas   DOB:30-Sep-1966   QB#:[id number]    Assessment & Plan:   Recurrent, locally advanced cervical cancer She has received first dose of chemotherapy on October 26, 2018. I expect her blood count will nadir at the end of the week Continue supportive care  Necrotic tumor with vaginal/rectal fistula, probable infection/abscess It is hard to interpret her imaging study but the patient likely have necrotic tumor complicated by fistula. She has received broad-spectrum IV antibiotics I had extensive discussion with GYN oncologist and general surgeryon 12/23about the role of palliative, diversion colostomy. She would be at very high risk of complication with infection and probable wound healing given prior history of radiation, recent chemotherapy, background history of HIV infection and protein calorie malnutrition. For now, I favor conservative management with IV antibiotics and supportive care It is also reasonable to consider for IR to drain the new fluid noted on CT  HIV infection She will continue antiretroviral treatment  Severe pelvic pain, improved Due to infection, disease and fistula She will continue to receive intermittent doses of pain medicine as needed  Altered mental status/confusion,somewhat sedated, resolved Suspect it could be related to pain medicine. Recent CT imaging of the head showedno evidence of intracranial metastasis  Severe constipation  Multifactorial, could be due to disease and side effects of medication Recommendcontinue onaggressive laxative therapy  Acute on chronic renal failure Due to hydronephrosis and disease, or possibility would include side effects of antibiotic treatment or recent CT scan with contrast She had stent placement recently. Slightly worse, question related to poor oral intake. Consider IV fluids if indicated. Will defer to primary service.  Consider nephrology consult  Chronicanemia Due to recent renal  disease and chemotherapy. Transfuse as neededif hemoglobin is less than 8  Moderate protein calorie malnutrition Due to her disease Nutritional supplement as needed  Goals of care discussion She hadnumerous discussionswith surgicalteams In my opinion, I would favor conservative management right now rather than diversion colostomy due to high risk of complication.  Discharge planning Not ready for discharge due to worsening renal failure. I will not be around this weekend.  Please call consult service if questions arise  [name], MD 11/02/2018  7:59 AM   Subjective:  She appears comfortable today.  She has not had bowel movement for 2 days.  Denies nausea.  She has been afebrile.  Overall, her pain control is stable with current prescription pain medicine  Objective:  Vitals:   11/02/18 0223 11/02/18 0440  BP: 115/76 129/86  Pulse: (!) 108 (!) 106  Resp: 18 18  Temp: 98.4 F (36.9 C) 98.5 F (36.9 C)  SpO2: 100% 98%     Intake/Output Summary (Last 24 hours) at 11/02/2018 0759 Last data filed at 11/02/2018 0447 Gross per 24 hour  Intake 773.58 ml  Output 300 ml  Net 473.58 ml    GENERAL:alert, no distress and comfortable SKIN: skin color, texture, turgor are normal, no rashes or significant lesions EYES: normal, Conjunctiva are pink and non-injected, sclera clear OROPHARYNX:no exudate, no erythema and lips, buccal mucosa, and tongue normal  NECK: supple, thyroid normal size, non-tender, without nodularity LYMPH:  no palpable lymphadenopathy in the cervical, axillary or inguinal LUNGS: clear to auscultation and percussion with normal breathing effort HEART: regular rate & rhythm and no murmurs and no lower extremity edema ABDOMEN:abdomen soft, no rebound or guarding. Musculoskeletal:no cyanosis of digits and no clubbing  NEURO: alert & oriented x 3 with fluent speech, no focal motor/sensory deficits  Labs:  Lab Results  Component Value Date   WBC 11.7  (H) 10/30/2018   HGB 9.8 (L) 10/30/2018   HCT 30.8 (L) 10/30/2018   MCV 93.6 10/30/2018   PLT 452 (H) 10/30/2018   NEUTROABS 12.9 (H) 10/26/2018    Lab Results  Component Value Date   NA 133 (L) 11/02/2018   K 4.8 11/02/2018   CL 107 11/02/2018   CO2 16 (L) 11/02/2018    Studies:  Ct Abdomen Pelvis Wo Contrast  Result Date: 11/01/2018 CLINICAL DATA:  HIV, cervical cancer on chemotherapy. Lower back pain. Hydronephrosis with worsening renal function. EXAM: CT ABDOMEN AND PELVIS WITHOUT CONTRAST TECHNIQUE: Multidetector CT imaging of the abdomen and pelvis was performed following the standard protocol without IV contrast. COMPARISON:  10/26/2018. FINDINGS: Lower chest: Small left pleural effusion with collapse/consolidation in the left lower lobe, new from 10/26/2018. Trace right pleural fluid. Decreased attenuation of the intravascular compartment is indicative of anemia. Heart size normal. No pericardial effusion. Hepatobiliary: Liver and gallbladder are unremarkable. No biliary ductal dilatation. Pancreas: Negative. Spleen: Negative. Adrenals/Urinary Tract: Adrenal glands are unremarkable. Double-J right ureteral stent is seen with the proximal loop formed in the right renal pelvis and distal loop formed in the bladder. Mild to moderate residual right hydronephrosis, similar to 10/26/2018. Extensive left perinephric fluid, new from 10/26/2018. Moderate left hydronephrosis. Fluid density lesion in the left kidney measures 4.1 cm, as before. Bladder is grossly unremarkable. Stomach/Bowel: Stomach and small bowel are unremarkable. Appendix is not readily visualized. Stool is seen throughout the colon. Vascular/Lymphatic: Vascular structures are grossly unremarkable. No definite pathologically enlarged lymph nodes. Reproductive: Uterus is visualized. Assess in the patient's cervical cancer is limited without IV contrast. Other: Left perirectal/presacral fluid collection measures approximately 2.7 x  3.7 cm, stable. Associated presacral thickening. Musculoskeletal: Degenerative changes in the spine. No worrisome lytic or sclerotic lesions. IMPRESSION: 1. Large left perinephric fluid collection is new from 10/26/2018 and most likely represents a urinoma related to hydronephrosis and forniceal rupture. 2. Mild to moderate right hydronephrosis with double-J ureteral stent in place. 3. Left perirectal/presacral fluid collection is grossly stable. Abscess is not excluded. 4. New small left pleural effusion. Associated collapse/consolidation in the left lower lobe is likely due to atelectasis. Pneumonia is not excluded. 5. Stool throughout the colon is indicative of constipation. Electronically Signed   By: Lorin Picket M.D.   On: 11/01/2018 16:38   US Renal  Result Date: 11/01/2018 CLINICAL DATA:  Hydronephrosis EXAM: RENAL / URINARY TRACT ULTRASOUND COMPLETE COMPARISON:  KUB 10/30/2018.  CT abdomen pelvis 10/26/2018 FINDINGS: Right Kidney: Renal measurements: 11.5 x 6.3 x 5.9 cm = volume: 224 mL. Moderate right hydronephrosis. Right ureteral stent extends into the renal pelvis. No mass or calculi. Minimal perinephric fluid on the right. Left Kidney: Renal measurements: 12.6 x 6.2 x 6.4 cm = volume: 263 mL. Mild left hydronephrosis. 4 cm midpole cyst. Large perinephric fluid collection with septations. This was not present on the recent CT. Bladder: Normal bladder.  Stent in the right bladder. IMPRESSION: Moderate hydronephrosis on the right with mild perinephric fluid on the right Mild left hydronephrosis with large complex perinephric fluid collection not present on CT of 10/26/2018. Question hematoma or abscess. Electronically Signed   By: Franchot Gallo M.D.   On: 11/01/2018 07:47

## 2018-11-02 NOTE — Progress Notes (Signed)
I have discussed this case with Dr. Alyson Ingles.  The patient has fairly rapid onset left hydronephrosis as well as recurrent right hydronephrosis following stent placement.  I would suggest that at this point percutaneous management of her hydronephrosis is more likely to adequately treat this, especially with her persistent hydronephrosis on the right following recent stenting.  Of course, this is somewhat of a heroic procedure in someone who should be considering palliative management.  I will be available over the weekend should urologic consultation be requested again.

## 2018-11-02 NOTE — Progress Notes (Signed)
11/02/18 0223  What Happened  Was fall witnessed? Yes  Who witnessed fall? Whitney Dickens  Patients activity before fall bathroom-assisted  Point of contact other (comment) (knees )  Was patient injured? No  Patient found in bathroom  Found by Staff-comment (NT assisting pt to bathroom)  Stated prior activity other (comment) (ambulating assisted with walker)  Follow Up  MD notified [place]   Time MD notified 48  Family notified No- patient refusal  Additional tests No  Simple treatment Other (comment) (pt stated she was not in pain )  Progress note created (see row info) Yes  Adult Fall Risk Assessment  Risk Factor Category (scoring not indicated) Fall has occurred during this admission (document High fall risk)  Age 52  Fall History: Fall within 6 months prior to admission 5  Elimination; Bowel and/or Urine Incontinence 0  Elimination; Bowel and/or Urine Urgency/Frequency 0  Medications: includes PCA/Opiates, Anti-convulsants, Anti-hypertensives, Diuretics, Hypnotics, Laxatives, Sedatives, and Psychotropics 3  Patient Care Equipment 1  Mobility-Assistance 2  Mobility-Gait 2  Mobility-Sensory Deficit 0  Altered awareness of immediate physical environment 0  Impulsiveness 0  Lack of understanding of one's physical/cognitive limitations 0  Total Score 13  Patient Fall Risk Level High fall risk  Adult Fall Risk Interventions  Required Bundle Interventions *See Row Information* High fall risk - low, moderate, and high requirements implemented  Additional Interventions Use of appropriate toileting equipment (bedpan, BSC, etc.)  Screening for Fall Injury Risk (To be completed on HIGH fall risk patients) - Assessing Need for Low Bed  Risk For Fall Injury- Low Bed Criteria Previous fall this admission  Will Implement Low Bed and Floor Mats No - Criteria no longer met for low bed  Specialty Low Bed Contraindicated Hemodynamically unstable  Screening for Fall Injury Risk  (To be completed on HIGH fall risk patients who do not meet crieteria for Low Bed) - Assessing Need for Floor Mats Only  Risk For Fall Injury- Criteria for Floor Mats None identified - No additional interventions needed  Vitals  Temp 98.4 F (36.9 C)  Temp Source Oral  BP 115/76  MAP (mmHg) 88  BP Location Right Arm  BP Method Automatic  Patient Position (if appropriate) Sitting  Pulse Rate (!) 108  Resp 18  Oxygen Therapy  SpO2 100 %  O2 Device Room Air  Pain Assessment  Pain Scale 0-10  Pain Score 0  PCA/Epidural/Spinal Assessment  Respiratory Pattern Regular;Unlabored  Neurological  Neuro (WDL) X  Level of Consciousness Alert  Orientation Level Oriented X4  Cognition Appropriate at baseline  Speech Clear  Pupil Assessment  Yes  R Pupil Size (mm) 2  R Pupil Shape Round  R Pupil Reaction Brisk  L Pupil Size (mm) 2  L Pupil Shape Round  L Pupil Reaction Brisk  Additional Pupil Assessments No  Motor Function/Sensation Assessment Grip  R Hand Grip Present;Moderate  L Hand Grip Present;Moderate   RUE Motor Response Purposeful movement  RUE Sensation Full sensation  RUE Motor Strength 5  LUE Motor Response Purposeful movement  LUE Sensation Full sensation  LUE Motor Strength 5  RLE Motor Response Responds to commands;Purposeful movement  RLE Sensation Full sensation  RLE Motor Strength 5  LLE Motor Response Responds to commands;Purposeful movement  LLE Sensation Full sensation  LLE Motor Strength 5  Neuro Symptoms None  Musculoskeletal  Musculoskeletal (WDL) X  Assistive Device Front wheel walker  Generalized Weakness Yes  Weight Bearing Restrictions No  Integumentary  Integumentary (  WDL) WDL

## 2018-11-02 NOTE — Progress Notes (Addendum)
PROGRESS NOTE    SHATERICA MCCLATCHY  VFI:[id number] DOB: 31-Dec-1965 DOA: 10/26/2018 PCP: Patient, No Pcp Per   Brief Narrative:  52 year old with history of HIV, cervical cancer on chemotherapy, diabetes mellitus type 2, anemia came to the hospital with lower back pain.  She notes having back pain for some time even prior to her recent chest cystostomy with stent placement for right-sided hydronephrosis and hydroureter.  Admitted for concerns for rectal abscess, progression of her poorly differentiated cervical carcinoma and possible tumor necrosis.Overall poor prognosis. Oncology, Gen Surgery, Gyn Surgery and Palliative Care team following.  Patient continues to have worsening of the renal function.  Ultrasound showed mass around the left kidney concerning for hematoma or abscess therefore CT of the abdomen pelvis was done which showed what appeared to be Urinoma and Forniceal rupture.     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  Holding off on surgical intervention.  General surgery team is following. -Appreciate input from palliative care team -Supportive  care, pain control - Broad-spectrum antibiotic-Cefepime/Flagyl.  -Pain control, bowel regimen -Abdominal x-ray-neg for acute pathology   Right sided Hydronephrosis status post stenting Left-sided hydronephrosis with urinoma and forniceal rupture. Acute kidney injury on CKD stage III -Patient's creatinine trended up to 3.67.  Today it is 3.55. -Need accurate input and output - Renal ultrasound-moderate right-sided hydro-, mild left-sided hydro-with large complex perinephric fluid collection-hematoma versus abscess. -Discussed with IR who recommended getting CT of the abdomen pelvis without contrast.  This showed concerns for urinoma with forniceal rupture on the left side.  Moderate hydronephrosis on the left.  Right-sided hydronephrosis with double-J stent in place- needs nephrostomy tubes.  -Spoke with Dr. Diona Fanti from urology who will follow up with the patient today.  I have also left a message with interventional radiology who reviewed the films and call me back.  Spoke with Dr Barbie Banner from IR at 1140 am: Their team will come evaluate the patient and weigh in their input regarding the procedure including risks and benefits.  I think this will benefit patient and help determining if she wants to proceed with the procedure or not.  I have reconsulted palliative care and request to see the patient today.   Hypothyroidism - Continue Synthroid  HIV -Continue home medication but today on hold due to worsening renal function.  Last CD4 count August 2019-130  Diabetes mellitus type 2 -Diet controlled.  Hemoglobin A1c 5.512/2019  Overall patient appears slightly depressed.  This is acceptable at this time given her poor prognosis and advanced malignancy.  Over time if this does not improve, we can consider starting on low-dose antidepressant.  DVT prophylaxis: Hep SQ Code Status: Full code Family Communication:  None Disposition Plan: Maintain inpatient stay until improvement of renal  function.  Consultants:   Urology  GYN/oncology  Oncology  General surgery   Antimicrobials:   Cefepime and Flagyl day 8   Subjective: Laying in the bed. No new complaints.   Review of Systems Otherwise negative except as per HPI, including: General = no fevers, chills, dizziness, malaise, fatigue HEENT/EYES = negative for pain, redness, loss of vision, double vision, blurred vision, loss of hearing, sore throat, hoarseness, dysphagia Cardiovascular= negative for chest pain, palpitation, murmurs, lower extremity swelling Respiratory/lungs= negative for shortness of breath, cough, hemoptysis, wheezing, mucus production Gastrointestinal= negative for nausea, vomiting melena, hematemesis Genitourinary= negative for Dysuria, Hematuria, Change in Urinary Frequency MSK = Negative for arthralgia, myalgias, Back Pain, Joint swelling  Neurology= Negative for headache, seizures, numbness, tingling  Psychiatry= Negative for anxiety, depression, suicidal and homocidal ideation Allergy/Immunology= Medication/Food allergy as listed  Skin= Negative for Rash, lesions, ulcers, itching    Objective: Vitals:   11/01/18 1420 11/01/18 2152 11/02/18 0223 11/02/18 0440  BP: 127/84 123/81 115/76 129/86  Pulse: 95 88 (!) 108 (!) 106  Resp: 18 18 18 18   Temp: 98.4 F (36.9 C) 98.6 F (37 C) 98.4 F (36.9 C) 98.5 F (36.9 C)  TempSrc: Oral Oral Oral Oral  SpO2: 96% 95% 100% 98%  Weight:      Height:        Intake/Output Summary (Last 24 hours) at 11/02/2018 1114 Last data filed at 11/02/2018 0447 Gross per 24 hour  Intake 620 ml  Output 300 ml  Net 320 ml   Filed Weights   10/26/18 2350  Weight: 86 kg    Examination: Constitutional: NAD, calm, comfortable Eyes: PERRL, lids and conjunctivae normal ENMT: Mucous membranes are moist. Posterior pharynx clear of any exudate or lesions.Normal dentition.  Neck: normal, supple, no masses, no thyromegaly Respiratory: clear to  auscultation bilaterally, no wheezing, no crackles. Normal respiratory effort. No accessory muscle use.  Cardiovascular: Regular rate and rhythm, no murmurs / rubs / gallops. No extremity edema. 2+ pedal pulses. No carotid bruits.  Abdomen: no tenderness, no masses palpated. No hepatosplenomegaly. Bowel sounds positive.  Musculoskeletal: no clubbing / cyanosis. No joint deformity upper and lower extremities. Good ROM, no contractures. Normal muscle tone.  Skin: no rashes, lesions, ulcers. No induration Neurologic: CN 2-12 grossly intact. Sensation intact, DTR normal. Strength 5/5 in all 4.  Psychiatric: Alert awake oriented, flat affect     Data Reviewed:   CBC: Recent Labs  Lab 10/26/18 1702 10/27/18 0932 10/28/18 0420 10/30/18 0507  WBC 13.7* 14.0* 12.3* 11.7*  NEUTROABS 12.9*  --   --   --   HGB 7.6* 9.6* 9.6* 9.8*  HCT 25.0* 30.5* 30.8* 30.8*  MCV 96.5 94.7 94.5 93.6  PLT 683* 766* 616* 710*   Basic Metabolic Panel: Recent Labs  Lab 10/28/18 0420 10/30/18 0507 10/31/18 0500 11/01/18 0424 11/02/18 0420  NA 136 133* 133* 132* 133*  K 4.4 4.3 4.4 4.7 4.8  CL 110 107 106 104 107  CO2 17* 18* 18* 18* 16*  GLUCOSE 153* 157* 159* 145* 118*  BUN 40* 35* 45* 56* 60*  CREATININE 1.56* 1.77* 2.98* 3.67* 3.55*  CALCIUM 8.4* 8.3* 8.0* 8.4* 8.5*  MG 2.3 2.2 2.2 2.5* 2.2   GFR: Estimated Creatinine Clearance: 18.5 mL/min (A) (by C-G formula based on SCr of 3.55 mg/dL (H)). Liver Function Tests: Recent Labs  Lab 10/26/18 1702 10/28/18 0420  AST 16 16  ALT  9 11  ALKPHOS 77 55  BILITOT 0.6 0.8  PROT 8.3* 7.0  ALBUMIN 3.0* 2.8*   Recent Labs  Lab 10/26/18 1702  LIPASE 33   Recent Labs  Lab 10/27/18 2053  AMMONIA 27   Coagulation Profile: No results for input(s): INR, PROTIME in the last 168 hours. Cardiac Enzymes: No results for input(s): CKTOTAL, CKMB, CKMBINDEX, TROPONINI in the last 168 hours. BNP (last 3 results) No results for input(s): PROBNP in the  last 8760 hours. HbA1C: No results for input(s): HGBA1C in the last 72 hours. CBG: Recent Labs  Lab 11/02/18 0253  GLUCAP 103*   Lipid Profile: No results for input(s): CHOL, HDL, LDLCALC, TRIG, CHOLHDL, LDLDIRECT in the last 72 hours. Thyroid Function Tests: No results for input(s): TSH, T4TOTAL, FREET4, T3FREE, THYROIDAB in the last 72 hours. Anemia Panel: No results for input(s): VITAMINB12, FOLATE, FERRITIN, TIBC, IRON, RETICCTPCT in the last 72 hours. Sepsis Labs: No results for input(s): PROCALCITON, LATICACIDVEN in the last 168 hours.  Recent Results (from the past 240 hour(s))  Urine culture     Status: Abnormal   Collection Time: 10/26/18  5:28 PM  Result Value Ref Range Status   Specimen Description   Final    URINE, RANDOM Performed at [facility] [address] [place], [place] [address]    Special Requests   Final    NONE Performed at [facility], [place] [address] [place], [place] [address]    Culture (A)  Final    <10,000 COLONIES/mL INSIGNIFICANT GROWTH Performed at [facility] [address], [place], [place] [address]    Report Status 10/27/2018 FINAL  Final  Culture, blood (routine x 2)     Status: None   Collection Time: 10/27/18 12:14 AM  Result Value Ref Range Status   Specimen Description   Final    BLOOD RIGHT ANTECUBITAL Performed at [facility] [address] [place], [place] [address]    Special Requests   Final    BOTTLES DRAWN AEROBIC ONLY Blood Culture adequate volume Performed at [facility] [address] [place], [place] [address]    Culture   Final    NO GROWTH 5 DAYS Performed at [facility], [place] [address] [place], [place] [address]    Report Status 11/01/2018 FINAL  Final  Culture, blood (routine x 2)     Status: None   Collection Time: 10/27/18 12:14 AM  Result Value Ref Range Status   Specimen Description   Final    BLOOD  LEFT ANTECUBITAL Performed at [facility] [address] [place], [place] [address]    Special Requests   Final    BOTTLES DRAWN AEROBIC AND ANAEROBIC Blood Culture adequate volume Performed at [facility] [address] [place],  [address]    Culture   Final    NO GROWTH 5 DAYS Performed at [facility], [place] [address] [place],  [address]    Report Status 11/01/2018 FINAL  Final         Radiology Studies: Ct Abdomen Pelvis Wo Contrast  Result Date: 11/01/2018 CLINICAL DATA:  HIV, cervical cancer on chemotherapy. Lower back pain. Hydronephrosis with worsening renal function. EXAM: CT ABDOMEN AND PELVIS WITHOUT CONTRAST TECHNIQUE: Multidetector CT imaging of the abdomen and pelvis was performed following the standard protocol without IV contrast. COMPARISON:  10/26/2018. FINDINGS: Lower chest: Small left pleural effusion with  collapse/consolidation in the left lower lobe, new from 10/26/2018. Trace right pleural fluid. Decreased attenuation of the intravascular compartment is indicative of anemia. Heart size normal. No pericardial effusion. Hepatobiliary: Liver and gallbladder are unremarkable. No biliary ductal dilatation. Pancreas: Negative. Spleen: Negative. Adrenals/Urinary Tract: Adrenal glands are unremarkable. Double-J right ureteral stent is seen with the proximal loop formed in the right renal pelvis and distal loop formed in the bladder. Mild to moderate residual right hydronephrosis, similar to 10/26/2018. Extensive left perinephric fluid, new from 10/26/2018. Moderate left hydronephrosis. Fluid density lesion in the left kidney measures 4.1 cm, as before. Bladder is grossly unremarkable. Stomach/Bowel: Stomach and small bowel are unremarkable. Appendix is not readily visualized. Stool is seen throughout the colon. Vascular/Lymphatic: Vascular structures are grossly unremarkable. No definite pathologically enlarged lymph  nodes. Reproductive: Uterus is visualized. Assess in the patient's cervical cancer is limited without IV contrast. Other: Left perirectal/presacral fluid collection measures approximately 2.7 x 3.7 cm, stable. Associated presacral thickening. Musculoskeletal: Degenerative changes in the spine. No worrisome lytic or sclerotic lesions. IMPRESSION: 1. Large left perinephric fluid collection is new from 10/26/2018 and most likely represents a urinoma related to hydronephrosis and forniceal rupture. 2. Mild to moderate right hydronephrosis with double-J ureteral stent in place. 3. Left perirectal/presacral fluid collection is grossly stable. Abscess is not excluded. 4. New small left pleural effusion. Associated collapse/consolidation in the left lower lobe is likely due to atelectasis. Pneumonia is not excluded. 5. Stool throughout the colon is indicative of constipation. Electronically Signed   By: Lorin Picket M.D.   On: 11/01/2018 16:38   US Renal  Result Date: 11/01/2018 CLINICAL DATA:  Hydronephrosis EXAM: RENAL / URINARY TRACT ULTRASOUND COMPLETE COMPARISON:  KUB 10/30/2018.  CT abdomen pelvis 10/26/2018 FINDINGS: Right Kidney: Renal measurements: 11.5 x 6.3 x 5.9 cm = volume: 224 mL. Moderate right hydronephrosis. Right ureteral stent extends into the renal pelvis. No mass or calculi. Minimal perinephric fluid on the right. Left Kidney: Renal measurements: 12.6 x 6.2 x 6.4 cm = volume: 263 mL. Mild left hydronephrosis. 4 cm midpole cyst. Large perinephric fluid collection with septations. This was not present on the recent CT. Bladder: Normal bladder.  Stent in the right bladder. IMPRESSION: Moderate hydronephrosis on the right with mild perinephric fluid on the right Mild left hydronephrosis with large complex perinephric fluid collection not present on CT of 10/26/2018. Question hematoma or abscess. Electronically Signed   By: Franchot Gallo M.D.   On: 11/01/2018 07:47        Scheduled Meds: .  docusate sodium  100 mg Oral BID  . feeding supplement  1 Container Oral TID BM  . folic acid  1 mg Oral Daily  . heparin injection (subcutaneous)  5,000 Units Subcutaneous Q8H  . levothyroxine  25 mcg Oral Q0600  . magnesium oxide  400 mg Oral BID  . morphine  60 mg Oral Q12H  . polyethylene glycol  17 g Oral Daily  . sodium chloride flush  10-40 mL Intracatheter Q12H  . [START ON 11/03/2018] Tbo-filgastrim (GRANIX) SQ  300 mcg Subcutaneous Daily   Continuous Infusions: . sodium chloride 10 mL/hr at 10/30/18 1500  . ceFEPime (MAXIPIME) IV 2 g (11/01/18 2217)  . metronidazole 500 mg (11/02/18 0636)     LOS: 7 days   Time spent= 45 mins    [place]us [place] Arsenio Loader, MD [facility] Pager [phone]   If 7PM-7AM, please contact night-coverage [email or web address] Password [email or web address] 11/02/2018, 11:14 AM

## 2018-11-02 NOTE — Progress Notes (Signed)
Subjective/Chief Complaint: Complains of low back pain   Objective: Vital signs in last 24 hours: Temp:  [98.4 F (36.9 C)-98.6 F (37 C)] 98.5 F (36.9 C) (12/27 0440) Pulse Rate:  [88-108] 106 (12/27 0440) Resp:  [18] 18 (12/27 0440) BP: (115-129)/(76-86) 129/86 (12/27 0440) SpO2:  [95 %-100 %] 98 % (12/27 0440) Last BM Date: 10/31/18  Intake/Output from previous day: 12/26 0701 - 12/27 0700 In: 773.6 [P.O.:540; I.V.:100; IV Piggyback:133.6] Out: 300 [Urine:300] Intake/Output this shift: No intake/output data recorded.  General appearance: alert and cooperative Resp: clear to auscultation bilaterally Cardio: regular rate and rhythm GI: soft, nontender  Lab Results:  No results for input(s): WBC, HGB, HCT, PLT in the last 72 hours. BMET Recent Labs    11/01/18 0424 11/02/18 0420  NA 132* 133*  K 4.7 4.8  CL 104 107  CO2 18* 16*  GLUCOSE 145* 118*  BUN 56* 60*  CREATININE 3.67* 3.55*  CALCIUM 8.4* 8.5*   PT/INR No results for input(s): LABPROT, INR in the last 72 hours. ABG No results for input(s): PHART, HCO3 in the last 72 hours.  Invalid input(s): PCO2, PO2  Studies/Results: Ct Abdomen Pelvis Wo Contrast  Result Date: 11/01/2018 CLINICAL DATA:  HIV, cervical cancer on chemotherapy. Lower back pain. Hydronephrosis with worsening renal function. EXAM: CT ABDOMEN AND PELVIS WITHOUT CONTRAST TECHNIQUE: Multidetector CT imaging of the abdomen and pelvis was performed following the standard protocol without IV contrast. COMPARISON:  10/26/2018. FINDINGS: Lower chest: Small left pleural effusion with collapse/consolidation in the left lower lobe, new from 10/26/2018. Trace right pleural fluid. Decreased attenuation of the intravascular compartment is indicative of anemia. Heart size normal. No pericardial effusion. Hepatobiliary: Liver and gallbladder are unremarkable. No biliary ductal dilatation. Pancreas: Negative. Spleen: Negative. Adrenals/Urinary Tract:  Adrenal glands are unremarkable. Double-J right ureteral stent is seen with the proximal loop formed in the right renal pelvis and distal loop formed in the bladder. Mild to moderate residual right hydronephrosis, similar to 10/26/2018. Extensive left perinephric fluid, new from 10/26/2018. Moderate left hydronephrosis. Fluid density lesion in the left kidney measures 4.1 cm, as before. Bladder is grossly unremarkable. Stomach/Bowel: Stomach and small bowel are unremarkable. Appendix is not readily visualized. Stool is seen throughout the colon. Vascular/Lymphatic: Vascular structures are grossly unremarkable. No definite pathologically enlarged lymph nodes. Reproductive: Uterus is visualized. Assess in the patient's cervical cancer is limited without IV contrast. Other: Left perirectal/presacral fluid collection measures approximately 2.7 x 3.7 cm, stable. Associated presacral thickening. Musculoskeletal: Degenerative changes in the spine. No worrisome lytic or sclerotic lesions. IMPRESSION: 1. Large left perinephric fluid collection is new from 10/26/2018 and most likely represents a urinoma related to hydronephrosis and forniceal rupture. 2. Mild to moderate right hydronephrosis with double-J ureteral stent in place. 3. Left perirectal/presacral fluid collection is grossly stable. Abscess is not excluded. 4. New small left pleural effusion. Associated collapse/consolidation in the left lower lobe is likely due to atelectasis. Pneumonia is not excluded. 5. Stool throughout the colon is indicative of constipation. Electronically Signed   By: Lorin Picket M.D.   On: 11/01/2018 16:38   US Renal  Result Date: 11/01/2018 CLINICAL DATA:  Hydronephrosis EXAM: RENAL / URINARY TRACT ULTRASOUND COMPLETE COMPARISON:  KUB 10/30/2018.  CT abdomen pelvis 10/26/2018 FINDINGS: Right Kidney: Renal measurements: 11.5 x 6.3 x 5.9 cm = volume: 224 mL. Moderate right hydronephrosis. Right ureteral stent extends into the renal  pelvis. No mass or calculi. Minimal perinephric fluid on the right. Left Kidney:  Renal measurements: 12.6 x 6.2 x 6.4 cm = volume: 263 mL. Mild left hydronephrosis. 4 cm midpole cyst. Large perinephric fluid collection with septations. This was not present on the recent CT. Bladder: Normal bladder.  Stent in the right bladder. IMPRESSION: Moderate hydronephrosis on the right with mild perinephric fluid on the right Mild left hydronephrosis with large complex perinephric fluid collection not present on CT of 10/26/2018. Question hematoma or abscess. Electronically Signed   By: Franchot Gallo M.D.   On: 11/01/2018 07:47    Anti-infectives: Anti-infectives (From admission, onward)   Start     Dose/Rate Route Frequency Ordered Stop   10/31/18 2000  ceFEPIme (MAXIPIME) 2 g in sodium chloride 0.9 % 100 mL IVPB     2 g 200 mL/hr over 30 Minutes Intravenous Every 24 hours 10/31/18 0812     10/27/18 2000  ceFEPIme (MAXIPIME) 2 g in sodium chloride 0.9 % 100 mL IVPB  Status:  Discontinued     2 g 200 mL/hr over 30 Minutes Intravenous Every 12 hours 10/27/18 1643 10/31/18 0812   10/27/18 1000  bictegravir-emtricitabine-tenofovir AF (BIKTARVY) 50-200-25 MG per tablet 1 tablet  Status:  Discontinued     1 tablet Oral Daily 10/26/18 2209 10/31/18 0836   10/27/18 1000  ceFEPIme (MAXIPIME) 1 g in sodium chloride 0.9 % 100 mL IVPB  Status:  Discontinued     1 g 200 mL/hr over 30 Minutes Intravenous Every 12 hours 10/27/18 0335 10/27/18 1643   10/26/18 2230  metroNIDAZOLE (FLAGYL) IVPB 500 mg     500 mg 100 mL/hr over 60 Minutes Intravenous Every 8 hours 10/26/18 2212     10/26/18 2215  ceFEPIme (MAXIPIME) 2 g in sodium chloride 0.9 % 100 mL IVPB     2 g 200 mL/hr over 30 Minutes Intravenous  Once 10/26/18 2212 10/27/18 0106      Assessment/Plan: s/p Procedure(s): LAPAROSCOPIC DIVERTED COLOSTOMY (N/A)    1.  Locally advanced cervical cancer with rectovaginal fistula and pelvic/rectal pain                         Treating physicians - Rossi/Gorsuch/Kinard             First dose of chemotx - 10/26/2018             She said that she had a BM yesterday.  2.  Left perirectal fluid collection - abscess vs necrotic tumor             WBC - 11,700 - 10/30/2018             On Cefepime/Flagyl  3.  R hydronephrosis/hydroureter - s/p stenting             Managed by Dr. Louis Meckel             Creatinine - 3.55             Renal US shows left perinephric fluid - 11/01/2018             Dr. Gloriann Loan mentioned possibly placing left ureteral stent 4.  Hypothyroidism - synthroid 5.  HIV/AIDS - last CD4 in August 130 6.  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

## 2018-11-02 NOTE — Progress Notes (Signed)
Referring Physician(s): Amin,A  Supervising Physician: Marybelle Killings  Patient Status:  [facility] - In-pt  Chief Complaint:  Flank pain, bilateral hydronephrosis, cervical cancer  Subjective: Patient familiar to IR service from prior Port-A-Cath placement on 02/01/2018. 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  Subsequent imaging has revealed a large left perinephric fluid collection likely representing a urinoma related to hydronephrosis and forniceal rupture, mild to moderate right hydronephrosis, left perirectal presacral fluid collection which is stable, and small left pleural effusion.  Current labs include creatinine 3.55, potassium 4.8; CBC from 12/24 with WBC11.7, hemoglobin 9.8, platelets 452k.  Request received from primary care team for possible left perinephric fluid collection drainage/nephrostomy as well as possible right nephrostomy placement.  Past Medical History:  Diagnosis Date  . Acquired pancytopenia ([place]) 03/2018  . Anemia    Mild  . Cancer associated pain   . Cervical cancer [facility]) oncologist-  dr gorsuch/  dr Sondra Come   dx 01-04-2017-- Stage IIIB,  cercial adenocarcinoma w/ local invasion perirectal area-- chemo started 02-09-2018 and external beam radiation completed 03/16/2018 ,  started brachytherapy boost high dose radiation 03-20-2018  . Chemotherapy induced nausea and vomiting   . Diabetes mellitus type 2, diet-controlled ([place])    pt. denies No meds  . Frequency of urination   . History of cellulitis 2018   bilateral lower leg w/ mrsa  . History of external beam radiation therapy 02-05-2018  to 03-16-2018   cervical cancer  . History of MRSA infection 2018   w/ bilateral lower leg cellulitis  .  HIV (human immunodeficiency virus infection) ([place])    asymptomatic  . Hypomagnesemia 03/2018   Severe---- takes oral magnesium and IV magnesium as needed (cancer center)  . Intermittent diarrhea    due to radiation/ chemo  . Nocturia   . Port-A-Cath in place    Power port  . Radiation burn    LOWER ABD.  04-06-2018  per is healing  . Subclinical hypothyroidism    w/ thyroiditis , dx 01-22-2018 PET scan  . Thrombocytopenia ([place])   . Wears glasses    Past Surgical History:  Procedure Laterality Date  . CYSTOSCOPY     retrograde pyelogram right ureteral stent placement  . CYSTOSCOPY W/ URETERAL STENT PLACEMENT Bilateral 10/24/2018   Procedure: CYSTOSCOPY WITH BILATERAL RETROGRADE PYELOGRAM RIGHT Wyvonnia Dusky STENT PLACEMENT;  Surgeon: Ardis Hughs, MD;  Location: [facility];  Service: Urology;  Laterality: Bilateral;  . EUA/ CERVICAL BX  01-04-2018   dr Ihor Dow  [facility]  . IR FLUORO GUIDE PORT INSERTION RIGHT  02/01/2018  . TANDEM RING INSERTION N/A 03/20/2018   Procedure: TANDEM RING INSERTION;  Surgeon: Gery Pray, MD;  Location: [facility];  Service: Urology;  Laterality: N/A;  . TANDEM RING INSERTION N/A 03/26/2018   Procedure: TANDEM RING INSERTION;  Surgeon: Gery Pray, MD;  Location: [facility];  Service: Urology;  Laterality: N/A;  . TANDEM RING INSERTION N/A 04/04/2018   Procedure: TANDEM RING INSERTION;  Surgeon: Gery Pray, MD;  Location: [facility];  Service: Urology;  Laterality: N/A;  . TANDEM RING INSERTION N/A 04/12/2018   Procedure: TANDEM RING INSERTION;  Surgeon: Gery Pray, MD;  Location: [facility];  Service: Urology;  Laterality: N/A;  . TANDEM  RING INSERTION N/A 04/18/2018   Procedure: TANDEM RING INSERTION;  Surgeon: Gery Pray, MD;  Location: [facility];  Service: Urology;  Laterality: N/A;  . TUBAL LIGATION Bilateral 1990s     Allergies: Patient has no known  allergies.  Medications: Prior to Admission medications   Medication Sig Start Date End Date Taking? Authorizing Provider  acetaminophen (TYLENOL) 500 MG tablet Take 1,000 mg by mouth every 6 (six) hours as needed for moderate pain or headache.    Yes [provider]  BIKTARVY 50-200-25 MG TABS tablet TAKE 1 TABLET BY MOUTH DAILY. 09/14/18  Yes Comer, Okey Regal, MD  dexamethasone (DECADRON) 4 MG tablet Take 5 tabs at the night before and 5 tab the morning of chemotherapy, every 3 weeks, by mouth 10/15/18  Yes Gorsuch, Ni, MD  levothyroxine (SYNTHROID, LEVOTHROID) 25 MCG tablet Take 1 tablet (25 mcg total) by mouth daily before breakfast. 02/08/18  Yes Alvy Bimler, Ni, MD  lidocaine-prilocaine (EMLA) cream Apply to affected area once 10/26/18  Yes Gorsuch, Ni, MD  magnesium oxide (MAG-OX) 400 (241.3 Mg) MG tablet Take 1 tablet (400 mg total) by mouth 2 (two) times daily. 03/08/18  Yes Gorsuch, Ni, MD  morphine (MS CONTIN) 60 MG 12 hr tablet Take 1 tablet (60 mg total) by mouth every 12 (twelve) hours. 10/15/18  Yes Gorsuch, Ni, MD  morphine (MSIR) 30 MG tablet Take 1 tablet (30 mg total) by mouth every 6 (six) hours as needed for severe pain. 10/15/18  Yes [name], MD  phenazopyridine (PYRIDIUM) 200 MG tablet Take 1 tablet (200 mg total) by mouth 3 (three) times daily as needed for pain. 10/24/18  Yes Ardis Hughs, MD  polyethylene glycol [facility] / Floria Raveling) packet Take 17 g by mouth daily.   Yes [provider]  ondansetron (ZOFRAN) 8 MG tablet Take 1 tablet (8 mg total) by mouth 2 (two) times daily as needed for refractory nausea / vomiting. Start on day 3 after chemo. 10/26/18   [name], MD  prochlorperazine (COMPAZINE) 10 MG tablet Take 1 tablet (10 mg total) by mouth every 6 (six) hours as needed (Nausea or vomiting). 10/26/18   [name], MD     Vital Signs: BP 126/84 (BP Location: Left Arm)   Pulse 79   Temp 98.2 F (36.8 C) (Oral)   Resp 20   Ht 5\' 1"  (1.549 m)    Wt 189 lb 9.5 oz (86 kg)   LMP 02/03/2018   SpO2 97%   BMI 35.82 kg/m   Physical Exam awake, alert.  Chest with slightly diminished breath sounds left base, right clear.  Clean, intact right chest wall Port-A-Cath, heart with slightly tachycardic but regular rhythm.  Abdomen soft, positive bowel sounds, mild left lateral abdominal tenderness to palpation.  No significant lower extremity edema.  Imaging: Ct Abdomen Pelvis Wo Contrast  Result Date: 11/01/2018 CLINICAL DATA:  HIV, cervical cancer on chemotherapy. Lower back pain. Hydronephrosis with worsening renal function. EXAM: CT ABDOMEN AND PELVIS WITHOUT CONTRAST TECHNIQUE: Multidetector CT imaging of the abdomen and pelvis was performed following the standard protocol without IV contrast. COMPARISON:  10/26/2018. FINDINGS: Lower chest: Small left pleural effusion with collapse/consolidation in the left lower lobe, new from 10/26/2018. Trace right pleural fluid. Decreased attenuation of the intravascular compartment is indicative of anemia. Heart size normal. No pericardial effusion. Hepatobiliary: Liver and gallbladder are unremarkable. No biliary ductal dilatation. Pancreas: Negative. Spleen: Negative. Adrenals/Urinary Tract: Adrenal glands are unremarkable. Double-J right ureteral stent  is seen with the proximal loop formed in the right renal pelvis and distal loop formed in the bladder. Mild to moderate residual right hydronephrosis, similar to 10/26/2018. Extensive left perinephric fluid, new from 10/26/2018. Moderate left hydronephrosis. Fluid density lesion in the left kidney measures 4.1 cm, as before. Bladder is grossly unremarkable. Stomach/Bowel: Stomach and small bowel are unremarkable. Appendix is not readily visualized. Stool is seen throughout the colon. Vascular/Lymphatic: Vascular structures are grossly unremarkable. No definite pathologically enlarged lymph nodes. Reproductive: Uterus is visualized. Assess in the patient's  cervical cancer is limited without IV contrast. Other: Left perirectal/presacral fluid collection measures approximately 2.7 x 3.7 cm, stable. Associated presacral thickening. Musculoskeletal: Degenerative changes in the spine. No worrisome lytic or sclerotic lesions. IMPRESSION: 1. Large left perinephric fluid collection is new from 10/26/2018 and most likely represents a urinoma related to hydronephrosis and forniceal rupture. 2. Mild to moderate right hydronephrosis with double-J ureteral stent in place. 3. Left perirectal/presacral fluid collection is grossly stable. Abscess is not excluded. 4. New small left pleural effusion. Associated collapse/consolidation in the left lower lobe is likely due to atelectasis. Pneumonia is not excluded. 5. Stool throughout the colon is indicative of constipation. Electronically Signed   By: Lorin Picket M.D.   On: 11/01/2018 16:38   Dg Abd 1 View  Result Date: 10/30/2018 CLINICAL DATA:  52 year old female with abdominal pain and distention for 1 day. Cervical cancer. Right double-J ureteral stent placed earlier this month for right side urologic obstruction. EXAM: ABDOMEN - 1 VIEW COMPARISON:  CT Abdomen and Pelvis 10/26/2018 and earlier. FINDINGS: Right double-J ureteral stent is stable in configuration. Numerous pelvic phleboliths redemonstrated. No nephrolithiasis on the recent CT. Non obstructed bowel gas pattern. Retained stool throughout the colon similar to the recent CT. No acute osseous abnormality identified. IMPRESSION: Stable appearance to the scout view of the recent CT Abdomen and Pelvis. Stable right double-J ureteral stent. Electronically Signed   By: Genevie Ann M.D.   On: 10/30/2018 12:42   US Renal  Result Date: 11/01/2018 CLINICAL DATA:  Hydronephrosis EXAM: RENAL / URINARY TRACT ULTRASOUND COMPLETE COMPARISON:  KUB 10/30/2018.  CT abdomen pelvis 10/26/2018 FINDINGS: Right Kidney: Renal measurements: 11.5 x 6.3 x 5.9 cm = volume: 224 mL. Moderate  right hydronephrosis. Right ureteral stent extends into the renal pelvis. No mass or calculi. Minimal perinephric fluid on the right. Left Kidney: Renal measurements: 12.6 x 6.2 x 6.4 cm = volume: 263 mL. Mild left hydronephrosis. 4 cm midpole cyst. Large perinephric fluid collection with septations. This was not present on the recent CT. Bladder: Normal bladder.  Stent in the right bladder. IMPRESSION: Moderate hydronephrosis on the right with mild perinephric fluid on the right Mild left hydronephrosis with large complex perinephric fluid collection not present on CT of 10/26/2018. Question hematoma or abscess. Electronically Signed   By: Franchot Gallo M.D.   On: 11/01/2018 07:47    Labs:  CBC: Recent Labs    10/26/18 1702 10/27/18 0932 10/28/18 0420 10/30/18 0507  WBC 13.7* 14.0* 12.3* 11.7*  HGB 7.6* 9.6* 9.6* 9.8*  HCT 25.0* 30.5* 30.8* 30.8*  PLT 683* 766* 616* 452*    COAGS: Recent Labs    02/01/18 0850  INR 1.03    BMP: Recent Labs    10/30/18 0507 10/31/18 0500 11/01/18 0424 11/02/18 0420  NA 133* 133* 132* 133*  K 4.3 4.4 4.7 4.8  CL 107 106 104 107  CO2 18* 18* 18* 16*  GLUCOSE 157* 159*  145* 118*  BUN 35* 45* 56* 60*  CALCIUM 8.3* 8.0* 8.4* 8.5*  CREATININE 1.77* 2.98* 3.67* 3.55*  GFRNONAA 32* 17* 13* 14*  GFRAA 38* 20* 16* 16*    LIVER FUNCTION TESTS: Recent Labs    10/15/18 1125 10/25/18 1159 10/26/18 1702 10/28/18 0420  BILITOT 0.4 0.3 0.6 0.8  AST 10* 15 16 16   ALT 6 8 9 11   ALKPHOS 84 92 77 55  PROT 8.9* 9.3* 8.3* 7.0  ALBUMIN 3.0* 3.0* 3.0* 2.8*    Assessment and Plan: Pt with history of HIV, hypothyroidism, perirectal abscess, 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  Subsequent imaging has revealed a large left perinephric fluid  collection likely representing a urinoma related to hydronephrosis and forniceal rupture, mild to moderate right hydronephrosis, left perirectal presacral fluid collection which is stable, and small left pleural effusion. Current labs include creatinine 3.55, potassium 4.8; CBC from 12/24 with WBC11.7, hemoglobin 9.8, platelets 452k.  Request received from primary care team for possible left perinephric fluid collection drainage/nephrostomy as well as possible right nephrostomy placement.  Imaging studies were reviewed by Dr. Barbie Banner and detail/risks of procedure, including but not limited to, internal bleeding, infection, injury to adjacent structures discussed with patient.  Patient wishes to discuss matter further with family before making decision to proceed. Also await f/u urology note.   Electronically Signed: D. Rowe Robert, PA-C 11/02/2018, 4:54 PM   I spent a total of 25 minutes at the the patient's bedside AND on the patient's hospital floor or unit, greater than 50% of which was counseling/coordinating care for possible bilateral nephrostomies    Patient ID: Misty Thomas, female   DOB: 1966-10-02, 52 y.o.   MRN: [id number]

## 2018-11-02 NOTE — Progress Notes (Signed)
   11/02/18 1115  Clinical Encounter Type  Visited With Patient and family together  Visit Type Follow-up  Referral From Palliative care team  Consult/Referral To Chaplain  The chaplain followed up with spiritual care visit.  The chaplain received a Pt. update from Pt. RN-Mary Ann. At time of visit Pt. daughter was helping Pt. with a bath.  The chaplain will F/U at a later time.

## 2018-11-02 NOTE — Progress Notes (Signed)
Daily Progress Note   Patient Name: Misty Thomas       Date: 11/02/2018 DOB: 14-Feb-1966  Age: 52 y.o. MRN#: [id number] Attending Physician: Damita Lack, MD Primary Care Physician: Patient, No Pcp Per Admit Date: 10/26/2018  Reason for Consultation/Follow-up: Establishing goals of care  Subjective:  patient is awake, resting in bed. She complains of mid back pain, on both sides.  There is no family at bedside See below   Length of Stay: 7  Current Medications: Scheduled Meds:  . feeding supplement  1 Container Oral TID BM  . folic acid  1 mg Oral Daily  . heparin injection (subcutaneous)  5,000 Units Subcutaneous Q8H  . levothyroxine  25 mcg Oral Q0600  . magnesium oxide  400 mg Oral BID  . morphine  60 mg Oral Q12H  . polyethylene glycol  17 g Oral Daily  . senna-docusate  1 tablet Oral BID  . sodium chloride flush  10-40 mL Intracatheter Q12H  . [START ON 11/03/2018] Tbo-filgastrim (GRANIX) SQ  300 mcg Subcutaneous Daily    Continuous Infusions: . sodium chloride 10 mL/hr at 10/30/18 1500  . ceFEPime (MAXIPIME) IV 2 g (11/01/18 2217)  . metronidazole 500 mg (11/02/18 0636)    PRN Meds: sodium chloride, acetaminophen **OR** acetaminophen, HYDROmorphone (DILAUDID) injection, morphine, ondansetron **OR** ondansetron (ZOFRAN) IV, sodium chloride flush  Physical Exam         Awake alert Mild distress due to pain in back area Clear breath sounds Abdomen is distended She has some edema S 1 S 2  Non focal  Vital Signs: BP 126/84 (BP Location: Left Arm)   Pulse 79   Temp 98.2 F (36.8 C) (Oral)   Resp 20   Ht 5' 1" (1.549 m)   Wt 86 kg   LMP 02/03/2018   SpO2 97%   BMI 35.82 kg/m  SpO2: SpO2: 97 % O2 Device: O2 Device: Room Air O2 Flow Rate:     Intake/output summary:   Intake/Output Summary (Last 24 hours) at 11/02/2018 1354 Last data filed at 11/02/2018 0447 Gross per 24 hour  Intake 620 ml  Output 300 ml  Net 320 ml   LBM: Last BM Date: 10/31/18 Baseline Weight: Weight: 86 kg Most recent weight: Weight: 86 kg      PPS 40% Palliative  Assessment/Data:    Flowsheet Rows     Most Recent Value  Intake Tab  Referral Department  Hospitalist  Unit at Time of Referral  -- [urology/telementry]  Date Notified  10/28/18  Palliative Care Type  New Palliative care  Reason for referral  Clarify Goals of Care, Non-pain Symptom  Date of Admission  10/28/18  Date first seen by Palliative Care  10/29/18  # of days Palliative referral response time  1 Day(s)  # of days IP prior to Palliative referral  0  Clinical Assessment  Psychosocial & Spiritual Assessment  Palliative Care Outcomes      Patient Active Problem List   Diagnosis Date Noted  . Perirectal abscess   . AKI (acute kidney injury) ([place]) 10/31/2018  . Palliative care by specialist   . 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 Subclinical hypothyroidism   . Port-A-Cath in place   . History of external beam radiation therapy   . Back pain 10/27/2018  . Presacral pelvic abscess from necrotic cervical cancer & rectovaginal fistula 10/26/2018  . DNR (do not resuscitate) discussion 10/15/2018  . Hydronephrosis due to cancer obstruction s/p right ureter stenting 10/25/2018 10/10/2018  . Other constipation 07/20/2018  . Preventive measure 07/20/2018  . Routine screening for STI (sexually transmitted infection) 07/02/2018  . Vaccine counseling 07/02/2018  . Hypomagnesemia 02/22/2018  . Pancytopenia, acquired ([place]) 02/22/2018  . Chemotherapy-induced nausea 02/15/2018  . HIV (human immunodeficiency virus infection) ([place]) 02/09/2018  . Acquired hypothyroidism 02/08/2018  . Anemia, blood loss 02/08/2018  . Abnormal thyroid uptake 01/25/2018  . Cancer  associated pain 01/25/2018  . Diabetes mellitus without complication ([place]) 69/48/5462  . Abnormal vaginal bleeding   . Primary cervical cancer with metastasis - Stage IV 01/04/2018    Palliative Care Assessment & Plan   Patient Profile:    Assessment:  poorly differentiated stage III cervical carcinoma with recurrence Rectal pain, rectal abscess Hydronephrosis AKI with III CKD DM HIV hypothyroidism  Recommendations/Plan: Patient had a  CT of the abdomen pelvis without contrast which showed concerns for urinoma with forniceal rupture on the left side,moderate hydronephrosis on the left,right-sided hydronephrosis with double-J stent in place-   It appears that the patient now needs nephrostomy tubes.  A repeat palliative care follow up was hence requested.   The patient complains that the pain in her mid back is not well controlled. She states that she met with IR, because she has already eaten, the procedure is to be attempted later.   Discussed with her about augmenting her pain regimen. Also has bowel regimen.   PMT will follow PRN.     Code Status:    Code Status Orders  (From admission, onward)         Start     Ordered   10/26/18 2206  Full code  Continuous     10/26/18 2209        Code Status History    This patient has a current code status but no historical code status.       Prognosis:   Unable to determine  Discharge Planning:  To Be Determined  Care plan was discussed with patient, also discussed with [facility] MD Dr. Reesa Chew.    Thank you for allowing the Palliative Medicine Team to assist in the care of this patient.   Time In:  1300 Time Out: 1335 Total Time 35 Prolonged Time Billed  no       Greater than 50%  of  this time was spent counseling and coordinating care related to the above assessment and plan.  Loistine Chance, MD [phone] Please contact Palliative Medicine Team phone at [phone] for questions and concerns.

## 2018-11-03 DIAGNOSIS — N179 Acute kidney failure, unspecified: Secondary | ICD-10-CM

## 2018-11-03 LAB — CBC WITH DIFFERENTIAL/PLATELET
Abs Immature Granulocytes: 0.01 10*3/uL (ref 0.00–0.07)
Basophils Absolute: 0 10*3/uL (ref 0.0–0.1)
Basophils Relative: 0 %
EOS ABS: 0 10*3/uL (ref 0.0–0.5)
Eosinophils Relative: 2 %
HCT: 25.3 % — ABNORMAL LOW (ref 36.0–46.0)
Hemoglobin: 7.8 g/dL — ABNORMAL LOW (ref 12.0–15.0)
Immature Granulocytes: 1 %
LYMPHS ABS: 0.2 10*3/uL — AB (ref 0.7–4.0)
Lymphocytes Relative: 14 %
MCH: 29.5 pg (ref 26.0–34.0)
MCHC: 30.8 g/dL (ref 30.0–36.0)
MCV: 95.8 fL (ref 80.0–100.0)
Monocytes Absolute: 0.3 10*3/uL (ref 0.1–1.0)
Monocytes Relative: 21 %
NRBC: 0 % (ref 0.0–0.2)
Neutro Abs: 0.9 10*3/uL — ABNORMAL LOW (ref 1.7–7.7)
Neutrophils Relative %: 62 %
Platelets: 240 10*3/uL (ref 150–400)
RBC: 2.64 MIL/uL — ABNORMAL LOW (ref 3.87–5.11)
RDW: 15.3 % (ref 11.5–15.5)
WBC: 1.5 10*3/uL — ABNORMAL LOW (ref 4.0–10.5)

## 2018-11-03 LAB — BASIC METABOLIC PANEL
Anion gap: 10 (ref 5–15)
BUN: 60 mg/dL — AB (ref 6–20)
CO2: 17 mmol/L — ABNORMAL LOW (ref 22–32)
Calcium: 8.5 mg/dL — ABNORMAL LOW (ref 8.9–10.3)
Chloride: 105 mmol/L (ref 98–111)
Creatinine, Ser: 3.74 mg/dL — ABNORMAL HIGH (ref 0.44–1.00)
GFR calc Af Amer: 15 mL/min — ABNORMAL LOW (ref >60)
GFR calc non Af Amer: 13 mL/min — ABNORMAL LOW (ref >60)
Glucose, Bld: 119 mg/dL — ABNORMAL HIGH (ref 70–99)
Potassium: 4.8 mmol/L (ref 3.5–5.1)
Sodium: 132 mmol/L — ABNORMAL LOW (ref 135–145)

## 2018-11-03 LAB — MAGNESIUM: Magnesium: 2.4 mg/dL (ref 1.7–2.4)

## 2018-11-03 LAB — PROTIME-INR
INR: 1.46
Prothrombin Time: 17.5 seconds — ABNORMAL HIGH (ref 11.4–15.2)

## 2018-11-03 MED ORDER — PSYLLIUM 95 % PO PACK
1.0000 | PACK | Freq: Every day | ORAL | Status: DC
  Administered 2018-11-03 – 2018-11-07 (×5): 1 via ORAL
  Filled 2018-11-03 (×6): qty 1

## 2018-11-03 NOTE — Plan of Care (Signed)
Pt alert and oriented, c/o severe pain in lower abd this morning. Relief with pain meds.  RN will monitor.

## 2018-11-03 NOTE — Progress Notes (Signed)
Pharmacy Antibiotic Note  Misty Thomas is a 52 y.o. female admitted on 10/26/2018 with perirectal and presacral abscess and possible UTI.  Pharmacy was consulted for Cefepime dosing.  11/03/2018 SCr worsening,  CrCl ~ 18 mls/min PTA med Biktarvy for HIV remains on hold in setting of AKI  CBC pending today Tm 100.1  Plan: Contiinue cefepime dosage of 2g IV q24h (renal dosage adjustment, immunocompromised patient). Continue metronidazole per MD.  Monitor renal function, cultures, clinical course.   Height: 5\' 1"  (154.9 cm) Weight: 189 lb 9.5 oz (86 kg) IBW/kg (Calculated) : 47.8  Temp (24hrs), Avg:99.2 F (37.3 C), Min:98.2 F (36.8 C), Max:100.1 F (37.8 C)  Recent Labs  Lab 10/27/18 0932 10/28/18 0420 10/30/18 0507 10/31/18 0500 11/01/18 0424 11/02/18 0420 11/03/18 0425  WBC 14.0* 12.3* 11.7*  --   --   --  QUESTIONABLE RESULTS, RECOMMEND RECOLLECT TO VERIFY  CREATININE 1.64* 1.56* 1.77* 2.98* 3.67* 3.55* 3.74*    Estimated Creatinine Clearance: 17.5 mL/min (A) (by C-G formula based on SCr of 3.74 mg/dL (H)).    No Known Allergies  Antimicrobials this admission: 12/21 Cefepime >> 12/21 Metronidazole >>   Microbiology results: 12/20 UCx: insignificant growth F 12/21 BCx: NGF  Thank you for allowing pharmacy to be a part of this patient's care.  Clayburn Pert, PharmD, BCPS [phone] 11/03/2018  8:07 AM

## 2018-11-03 NOTE — Progress Notes (Signed)
Subjective/Chief Complaint: Complains of low back pain. Denies n/v.  Objective: Vital signs in last 24 hours: Temp:  [98.2 F (36.8 C)-100.1 F (37.8 C)] 99.4 F (37.4 C) (12/28 0546) Pulse Rate:  [79-122] 122 (12/28 0546) Resp:  [18-20] 18 (12/28 0546) BP: (126-134)/(84-92) 134/92 (12/28 0546) SpO2:  [93 %-98 %] 93 % (12/28 0546) Last BM Date: 10/31/18  Intake/Output from previous day: 12/27 0701 - 12/28 0700 In: -  Out: 600 [Urine:600] Intake/Output this shift: No intake/output data recorded.  General appearance: alert and cooperative Resp: clear to auscultation bilaterally Cardio: regular rate and rhythm GI: soft, nontender  Lab Results:  Recent Labs    11/03/18 0425  WBC QUESTIONABLE RESULTS, RECOMMEND RECOLLECT TO VERIFY  HGB QUESTIONABLE RESULTS, RECOMMEND RECOLLECT TO VERIFY  HCT QUESTIONABLE RESULTS, RECOMMEND RECOLLECT TO VERIFY  PLT QUESTIONABLE RESULTS, RECOMMEND RECOLLECT TO VERIFY   BMET Recent Labs    11/02/18 0420 11/03/18 0425  NA 133* 132*  K 4.8 4.8  CL 107 105  CO2 16* 17*  GLUCOSE 118* 119*  BUN 60* 60*  CREATININE 3.55* 3.74*  CALCIUM 8.5* 8.5*   PT/INR Recent Labs    11/03/18 0425  LABPROT 17.5*  INR 1.46   ABG No results for input(s): PHART, HCO3 in the last 72 hours.  Invalid input(s): PCO2, PO2  Studies/Results: Ct Abdomen Pelvis Wo Contrast  Result Date: 11/01/2018 CLINICAL DATA:  HIV, cervical cancer on chemotherapy. Lower back pain. Hydronephrosis with worsening renal function. EXAM: CT ABDOMEN AND PELVIS WITHOUT CONTRAST TECHNIQUE: Multidetector CT imaging of the abdomen and pelvis was performed following the standard protocol without IV contrast. COMPARISON:  10/26/2018. FINDINGS: Lower chest: Small left pleural effusion with collapse/consolidation in the left lower lobe, new from 10/26/2018. Trace right pleural fluid. Decreased attenuation of the intravascular compartment is indicative of anemia. Heart size normal.  No pericardial effusion. Hepatobiliary: Liver and gallbladder are unremarkable. No biliary ductal dilatation. Pancreas: Negative. Spleen: Negative. Adrenals/Urinary Tract: Adrenal glands are unremarkable. Double-J right ureteral stent is seen with the proximal loop formed in the right renal pelvis and distal loop formed in the bladder. Mild to moderate residual right hydronephrosis, similar to 10/26/2018. Extensive left perinephric fluid, new from 10/26/2018. Moderate left hydronephrosis. Fluid density lesion in the left kidney measures 4.1 cm, as before. Bladder is grossly unremarkable. Stomach/Bowel: Stomach and small bowel are unremarkable. Appendix is not readily visualized. Stool is seen throughout the colon. Vascular/Lymphatic: Vascular structures are grossly unremarkable. No definite pathologically enlarged lymph nodes. Reproductive: Uterus is visualized. Assess in the patient's cervical cancer is limited without IV contrast. Other: Left perirectal/presacral fluid collection measures approximately 2.7 x 3.7 cm, stable. Associated presacral thickening. Musculoskeletal: Degenerative changes in the spine. No worrisome lytic or sclerotic lesions. IMPRESSION: 1. Large left perinephric fluid collection is new from 10/26/2018 and most likely represents a urinoma related to hydronephrosis and forniceal rupture. 2. Mild to moderate right hydronephrosis with double-J ureteral stent in place. 3. Left perirectal/presacral fluid collection is grossly stable. Abscess is not excluded. 4. New small left pleural effusion. Associated collapse/consolidation in the left lower lobe is likely due to atelectasis. Pneumonia is not excluded. 5. Stool throughout the colon is indicative of constipation. Electronically Signed   By: Lorin Picket M.D.   On: 11/01/2018 16:38    Anti-infectives: Anti-infectives (From admission, onward)   Start     Dose/Rate Route Frequency Ordered Stop   10/31/18 2000  ceFEPIme (MAXIPIME) 2 g in  sodium chloride 0.9 % 100 mL  IVPB     2 g 200 mL/hr over 30 Minutes Intravenous Every 24 hours 10/31/18 0812     10/27/18 2000  ceFEPIme (MAXIPIME) 2 g in sodium chloride 0.9 % 100 mL IVPB  Status:  Discontinued     2 g 200 mL/hr over 30 Minutes Intravenous Every 12 hours 10/27/18 1643 10/31/18 0812   10/27/18 1000  bictegravir-emtricitabine-tenofovir AF (BIKTARVY) 50-200-25 MG per tablet 1 tablet  Status:  Discontinued     1 tablet Oral Daily 10/26/18 2209 10/31/18 0836   10/27/18 1000  ceFEPIme (MAXIPIME) 1 g in sodium chloride 0.9 % 100 mL IVPB  Status:  Discontinued     1 g 200 mL/hr over 30 Minutes Intravenous Every 12 hours 10/27/18 0335 10/27/18 1643   10/26/18 2230  metroNIDAZOLE (FLAGYL) IVPB 500 mg     500 mg 100 mL/hr over 60 Minutes Intravenous Every 8 hours 10/26/18 2212     10/26/18 2215  ceFEPIme (MAXIPIME) 2 g in sodium chloride 0.9 % 100 mL IVPB     2 g 200 mL/hr over 30 Minutes Intravenous  Once 10/26/18 2212 10/27/18 0106      Assessment/Plan: s/p Procedure(s): LAPAROSCOPIC DIVERTED COLOSTOMY (N/A)    1.  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  Left perirectal fluid collection - abscess vs necrotic tumor             WBC - 11,700 - 10/30/2018 - repeat pending             On Cefepime/Flagyl  Also on chemotherapy - would avoid surgery at this time for this reason  3.  R hydronephrosis/hydroureter - s/p stenting             Managed by Dr. Louis Meckel             Creatinine - 3.55             Renal US shows left perinephric fluid - 11/01/2018             Urology considering perc nephrostomy of left side now given rapid onset urinoma/forniceal rupture 4.  Hypothyroidism - synthroid 5.  HIV/AIDS - last CD4 in August 130 6.  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

## 2018-11-03 NOTE — Progress Notes (Signed)
PROGRESS NOTE  KYNSLEI ART GMW:[id number] DOB: 16-Aug-1966 DOA: 10/26/2018 PCP: Patient, No Pcp Per  Brief Narrative: 52 year old woman PMH HIV/AIDS, cervical cancer with metastatic disease, recent cystoscopy with stent placement for right hydronephrosis presented with chills, shortness of breath, blood in stools.  Admitted for perirectal and peri-sacral abscess.  Seen by general surgery, GYN oncology, oncology, palliative medicine, urology.  Initially conservative management was recommended although diverting colostomy was suggested.  Subsequently creatinine worsened and CT scan revealed forniceal rupture and left urinoma.  With mild to moderate right hydronephrosis with stent in place.  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  Complications include necrotic tumor, vaginal rectal fistula, possible infection/abscess, severe pain --General surgery following, recommending conservative management at this point as is GYN oncology.  Continue empiric antibiotics. --Prognosis felt to be poor.  Palliative medicine has evaluated, patient continues to desire full care.  Acute kidney injury, secondary to right hydronephrosis, hydroureter, left forniceal rupture and urinoma.  Right greater than left hydronephrosis with stent placement initially 12/18. --Renal function worsening.  Urine output adequate.  Urology recommended percutaneous drainage. --Interventional radiology plans for percutaneous left nephrostomy and probable right nephrostomy 12/29.  Possible urinoma drain placement. --BMP in AM  Multifactorial anemia, including anemia of chronic kidney disease, malignancy, blood loss. --CBC in a.m.  Cancer associated pain --Continue present regimen.  HIV/AIDS --Regimen put on hold by previous attending physician for worsening renal function  Diabetes  mellitus type 2, diet-controlled --Stable.  TSH elevated --Follow-up as an outpatient.   Her complex case, chart reviewed in detail.  DVT prophylaxis: Heparin Code Status: Full Family Communication:  Disposition Plan: Pending   Murray Hodgkins, MD  [facility] Direct contact: [phone] --Via amion app OR  --[email or web address]; password [email or web address]  7PM-7AM contact night coverage as above 11/03/2018, 7:24 PM  LOS: 8 days   Consultants:  General surgery  GYN oncology  BMT  Procedures:    Antimicrobials:    Interval history/Subjective: Feels okay.  Objective: Vitals:  Vitals:   11/03/18 0546 11/03/18 1415  BP: (!) 134/92 (!) 132/92  Pulse: (!) 122 96  Resp: 18   Temp: 99.4 F (37.4 C)   SpO2: 93% 100%    Exam:  Constitutional:  . Appears calm and comfortable, ill but not toxic Respiratory:  . CTA bilaterally, no w/r/r.  . Respiratory effort normal.  Cardiovascular:  . RRR, no m/r/g . No LE extremity edema   Abdomen:  . Soft Psychiatric:  . Mental status o Mood appears depressed, affect appropriate   I have personally reviewed the following:   Data: . Creatinine continues to worsen, 3.74, potassium within normal limits, hemoglobin somewhat lower today 7.8.  Scheduled Meds: . feeding supplement  1 Container Oral TID BM  . folic acid  1 mg Oral Daily  . heparin injection (subcutaneous)  5,000 Units Subcutaneous Q8H  . levothyroxine  25 mcg Oral Q0600  . magnesium oxide  400 mg Oral BID  . morphine  60 mg Oral Q12H  . polyethylene glycol  17 g Oral Daily  . psyllium  1 packet Oral Daily  . senna-docusate  1 tablet Oral BID  . sodium chloride flush  10-40 mL Intracatheter Q12H  . Tbo-filgastrim (GRANIX) SQ  300 mcg Subcutaneous Daily   Continuous Infusions: . sodium chloride 10 mL/hr at 10/30/18 1500  . ceFEPime (MAXIPIME) IV 2 g (11/02/18 2013)  . 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

## 2018-11-03 NOTE — Progress Notes (Signed)
Subjective/Chief Complaint: Patient is currently eating and comfortable in bed.  Patient has decided to proceed with nephrostomy tube placement.   Objective: Vital signs in last 24 hours: Temp:  [99.4 F (37.4 C)-100.1 F (37.8 C)] 99.4 F (37.4 C) (12/28 0546) Pulse Rate:  [96-122] 96 (12/28 1415) Resp:  [18] 18 (12/28 0546) BP: (132-134)/(90-92) 132/92 (12/28 1415) SpO2:  [93 %-100 %] 100 % (12/28 1415) Last BM Date: 10/31/18  Intake/Output from previous day: 12/27 0701 - 12/28 0700 In: -  Out: 600 [Urine:600] Intake/Output this shift: Total I/O In: 197.3 [I.V.:97.3; IV Piggyback:100] Out: 550 [Urine:550]    Lab Results:  Recent Labs    11/03/18 0425 11/03/18 0910  WBC QUESTIONABLE RESULTS, RECOMMEND RECOLLECT TO VERIFY 1.5*  HGB QUESTIONABLE RESULTS, RECOMMEND RECOLLECT TO VERIFY 7.8*  HCT QUESTIONABLE RESULTS, RECOMMEND RECOLLECT TO VERIFY 25.3*  PLT QUESTIONABLE RESULTS, RECOMMEND RECOLLECT TO VERIFY 240   BMET Recent Labs    11/02/18 0420 11/03/18 0425  NA 133* 132*  K 4.8 4.8  CL 107 105  CO2 16* 17*  GLUCOSE 118* 119*  BUN 60* 60*  CREATININE 3.55* 3.74*  CALCIUM 8.5* 8.5*   PT/INR Recent Labs    11/03/18 0425  LABPROT 17.5*  INR 1.46    Studies/Results: No results found.  Anti-infectives: Anti-infectives (From admission, onward)   Start     Dose/Rate Route Frequency Ordered Stop   10/31/18 2000  ceFEPIme (MAXIPIME) 2 g in sodium chloride 0.9 % 100 mL IVPB     2 g 200 mL/hr over 30 Minutes Intravenous Every 24 hours 10/31/18 0812     10/27/18 2000  ceFEPIme (MAXIPIME) 2 g in sodium chloride 0.9 % 100 mL IVPB  Status:  Discontinued     2 g 200 mL/hr over 30 Minutes Intravenous Every 12 hours 10/27/18 1643 10/31/18 0812   10/27/18 1000  bictegravir-emtricitabine-tenofovir AF (BIKTARVY) 50-200-25 MG per tablet 1 tablet  Status:  Discontinued     1 tablet Oral Daily 10/26/18 2209 10/31/18 0836   10/27/18 1000  ceFEPIme (MAXIPIME) 1 g  in sodium chloride 0.9 % 100 mL IVPB  Status:  Discontinued     1 g 200 mL/hr over 30 Minutes Intravenous Every 12 hours 10/27/18 0335 10/27/18 1643   10/26/18 2230  metroNIDAZOLE (FLAGYL) IVPB 500 mg     500 mg 100 mL/hr over 60 Minutes Intravenous Every 8 hours 10/26/18 2212     10/26/18 2215  ceFEPIme (MAXIPIME) 2 g in sodium chloride 0.9 % 100 mL IVPB     2 g 200 mL/hr over 30 Minutes Intravenous  Once 10/26/18 2212 10/27/18 0106      Assessment/Plan: 53 yo with multiple complex medical problems including locally advanced cervical cancer, HIV and bilateral hydronephrosis. Recent CT and US demonstrated right hydronephrosis despite having a right ureter stent.  Patient had mild left hydronephrosis at recent retrograde study but now she has new left perinephric fluid which is likely related to forniceal rupture.  In addition, the renal function is not improving, 3.74 today.  Urology has recommended percutaneous management of the hydronephrosis rather than a left ureter stent since there is residual right hydronephrosis after stent placement.  I had a long discussion with patient and family about nephrostomy tubes.  Explained that we would plan for left nephrostomy and probable right nephrostomy if US demonstrated residual hydronephrosis.  In addition, we will need to sample the fluid around the left kidney and possibly place a urinoma drain.  Patient  has a good understanding of the procedures and would like to proceed with drain placements.  Will need to recheck labs tomorrow to see if WBC has recovered (1.5 today).     Burman Riis 11/03/2018

## 2018-11-04 ENCOUNTER — Encounter (HOSPITAL_COMMUNITY): Payer: Self-pay | Admitting: Interventional Radiology

## 2018-11-04 ENCOUNTER — Inpatient Hospital Stay (HOSPITAL_COMMUNITY): Payer: Medicaid Other

## 2018-11-04 HISTORY — PX: IR NEPHROSTOMY PLACEMENT LEFT: IMG6063

## 2018-11-04 HISTORY — PX: IR NEPHROSTOMY PLACEMENT RIGHT: IMG6064

## 2018-11-04 LAB — CBC WITH DIFFERENTIAL/PLATELET
ABS IMMATURE GRANULOCYTES: 0.04 10*3/uL (ref 0.00–0.07)
BASOS ABS: 0 10*3/uL (ref 0.0–0.1)
Basophils Relative: 0 %
Eosinophils Absolute: 0 10*3/uL (ref 0.0–0.5)
Eosinophils Relative: 2 %
HCT: 27 % — ABNORMAL LOW (ref 36.0–46.0)
Hemoglobin: 8.2 g/dL — ABNORMAL LOW (ref 12.0–15.0)
Immature Granulocytes: 2 %
Lymphocytes Relative: 23 %
Lymphs Abs: 0.6 10*3/uL — ABNORMAL LOW (ref 0.7–4.0)
MCH: 28.6 pg (ref 26.0–34.0)
MCHC: 30.4 g/dL (ref 30.0–36.0)
MCV: 94.1 fL (ref 80.0–100.0)
Monocytes Absolute: 0.5 10*3/uL (ref 0.1–1.0)
Monocytes Relative: 21 %
NEUTROS ABS: 1.4 10*3/uL — AB (ref 1.7–7.7)
NRBC: 0 % (ref 0.0–0.2)
Neutrophils Relative %: 52 %
Platelets: 303 10*3/uL (ref 150–400)
RBC: 2.87 MIL/uL — ABNORMAL LOW (ref 3.87–5.11)
RDW: 15.3 % (ref 11.5–15.5)
WBC: 2.6 10*3/uL — ABNORMAL LOW (ref 4.0–10.5)

## 2018-11-04 LAB — BASIC METABOLIC PANEL
Anion gap: 12 (ref 5–15)
BUN: 58 mg/dL — ABNORMAL HIGH (ref 6–20)
CO2: 16 mmol/L — ABNORMAL LOW (ref 22–32)
Calcium: 8.6 mg/dL — ABNORMAL LOW (ref 8.9–10.3)
Chloride: 106 mmol/L (ref 98–111)
Creatinine, Ser: 3.7 mg/dL — ABNORMAL HIGH (ref 0.44–1.00)
GFR calc Af Amer: 15 mL/min — ABNORMAL LOW (ref >60)
GFR, EST NON AFRICAN AMERICAN: 13 mL/min — AB (ref >60)
Glucose, Bld: 107 mg/dL — ABNORMAL HIGH (ref 70–99)
Potassium: 4.8 mmol/L (ref 3.5–5.1)
Sodium: 134 mmol/L — ABNORMAL LOW (ref 135–145)

## 2018-11-04 LAB — PROTIME-INR
INR: 1.42
Prothrombin Time: 17.2 seconds — ABNORMAL HIGH (ref 11.4–15.2)

## 2018-11-04 MED ORDER — LIDOCAINE HCL 1 % IJ SOLN
INTRAMUSCULAR | Status: AC | PRN
  Administered 2018-11-04: 10 mL via INTRADERMAL

## 2018-11-04 MED ORDER — HEPARIN SODIUM (PORCINE) 5000 UNIT/ML IJ SOLN
5000.0000 [IU] | Freq: Three times a day (TID) | INTRAMUSCULAR | Status: AC
  Administered 2018-11-04: 5000 [IU] via SUBCUTANEOUS
  Filled 2018-11-04: qty 1

## 2018-11-04 MED ORDER — FENTANYL CITRATE (PF) 100 MCG/2ML IJ SOLN
INTRAMUSCULAR | Status: AC
  Filled 2018-11-04: qty 2

## 2018-11-04 MED ORDER — FENTANYL CITRATE (PF) 100 MCG/2ML IJ SOLN
INTRAMUSCULAR | Status: AC | PRN
  Administered 2018-11-04: 50 ug via INTRAVENOUS
  Administered 2018-11-04: 25 ug via INTRAVENOUS
  Administered 2018-11-04: 75 ug via INTRAVENOUS
  Administered 2018-11-04: 50 ug via INTRAVENOUS

## 2018-11-04 MED ORDER — MIDAZOLAM HCL 2 MG/2ML IJ SOLN
INTRAMUSCULAR | Status: AC
  Filled 2018-11-04: qty 2

## 2018-11-04 MED ORDER — SIMETHICONE 80 MG PO CHEW
80.0000 mg | CHEWABLE_TABLET | Freq: Once | ORAL | Status: AC
  Administered 2018-11-04: 80 mg via ORAL
  Filled 2018-11-04: qty 1

## 2018-11-04 MED ORDER — SODIUM CHLORIDE 0.9% FLUSH
5.0000 mL | Freq: Three times a day (TID) | INTRAVENOUS | Status: DC
  Administered 2018-11-04 – 2018-11-09 (×13): 5 mL

## 2018-11-04 MED ORDER — HYDROMORPHONE HCL 1 MG/ML IJ SOLN
0.5000 mg | INTRAMUSCULAR | Status: DC | PRN
  Administered 2018-11-04 – 2018-11-05 (×5): 0.5 mg via INTRAVENOUS
  Filled 2018-11-04 (×5): qty 0.5

## 2018-11-04 MED ORDER — IOPAMIDOL (ISOVUE-300) INJECTION 61%: 50.0000 mL | Freq: Once | INTRAVENOUS | Status: DC | PRN

## 2018-11-04 MED ORDER — IOPAMIDOL (ISOVUE-300) INJECTION 61%
INTRAVENOUS | Status: AC
  Administered 2018-11-04: 20 mL
  Filled 2018-11-04: qty 50

## 2018-11-04 MED ORDER — LIDOCAINE HCL 1 % IJ SOLN
INTRAMUSCULAR | Status: AC
  Filled 2018-11-04: qty 20

## 2018-11-04 MED ORDER — MIDAZOLAM HCL 2 MG/2ML IJ SOLN
INTRAMUSCULAR | Status: AC | PRN
  Administered 2018-11-04 (×3): 1 mg via INTRAVENOUS
  Administered 2018-11-04: 0.5 mg via INTRAVENOUS

## 2018-11-04 NOTE — Progress Notes (Signed)
PROGRESS NOTE  Misty Thomas GGY:[id number] DOB: 02/18/1966 DOA: 10/26/2018 PCP: Patient, No Pcp Per  Brief Narrative: 52 year old woman PMH HIV/AIDS, cervical cancer with metastatic disease, recent cystoscopy with stent placement for right hydronephrosis presented with chills, shortness of breath, blood in stools.  Admitted for perirectal and peri-sacral abscess.  Seen by general surgery, GYN oncology, oncology, palliative medicine, urology.  Initially conservative management was recommended although diverting colostomy was suggested.  Subsequently creatinine worsened and CT scan revealed forniceal rupture and left urinoma.  With mild to moderate right hydronephrosis with stent in place.  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  Complications include necrotic tumor, vaginal rectal fistula, possible infection/abscess, severe pain --Management as per general surgery, recommendation continues to be conservative management.  . --Poor prognosis.  Palliative medicine has evaluated.  Plan for full care at this time.   --Continue cefepime and metronidazole --Will ask ID for further recommendations 12/30  Acute kidney injury, secondary to right hydronephrosis, hydroureter, left forniceal rupture and urinoma.  Right greater than left hydronephrosis with stent placement initially 12/18. --Renal function without change today.  Urine output improved. --Plan for percutaneous drainage today of the left and possibly the right kidney as per interventional radiology.  Possible urinoma drain placement as well. --BMP in a.m.  Multifactorial anemia, including anemia of chronic kidney disease, malignancy, blood loss. --Appears stable.  Follow intermittently.  Cancer associated pain --Continue present regimen  HIV/AIDS --Regimen put on hold by previous attending  physician for worsening renal function.  Resume when able.  Diabetes mellitus type 2, diet-controlled --Remains stable.  TSH elevated --Follow-up as an outpatient.  DVT prophylaxis: Heparin Code Status: Full Family Communication:  Disposition Plan: Pending   Murray Hodgkins, MD  [facility] Direct contact: [phone] --Via amion app OR  --[email or web address]; password [email or web address]  7PM-7AM contact night coverage as above 11/04/2018, 12:18 PM  LOS: 9 days   Consultants:  General surgery  GYN oncology  PMT  Procedures:    Antimicrobials:  Cefepime 12/20 >  Metronidazole 12/20 >  Interval history/Subjective: Had 2 bowel movements this morning.  Some back pain.  Objective: Vitals:  Vitals:   11/03/18 2129 11/04/18 0614  BP: (!) 146/127 127/83  Pulse: (!) 117 (!) 122  Resp: 17 17  Temp: 99.8 F (37.7 C) 99.1 F (37.3 C)  SpO2: 99% 98%    Exam:  Constitutional:   . Appears calm and comfortable Respiratory:  . CTA bilaterally, no w/r/r.  . Respiratory effort normal.  Cardiovascular:  . RRR, no m/r/g . No LE extremity edema   Psychiatric:  . Mental status o Mood, affect appropriate  I have personally reviewed the following:   Data: . Creatinine stable, 3.70.  BUN stable 58.  Potassium within normal limits. . WBC increased, 2.6.  Hemoglobin stable 8.2.  Platelets within normal limits.  Scheduled Meds: . feeding supplement  1 Container Oral TID BM  . fentaNYL      . folic acid  1 mg Oral Daily  . iopamidol      . levothyroxine  25 mcg Oral Q0600  . lidocaine      . magnesium oxide  400 mg Oral BID  . midazolam      . morphine  60 mg Oral Q12H  . polyethylene glycol  17 g Oral Daily  . psyllium  1 packet Oral Daily  . senna-docusate  1 tablet Oral  BID  . sodium chloride flush  10-40 mL Intracatheter Q12H  . Tbo-filgastrim (GRANIX) SQ  300 mcg Subcutaneous Daily   Continuous Infusions: . sodium chloride 10 mL/hr at 10/30/18 1500  . ceFEPime  (MAXIPIME) IV 2 g (11/03/18 2057)  . 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

## 2018-11-04 NOTE — Progress Notes (Signed)
MEDICATION-RELATED CONSULT NOTE   IR Procedure Consult - Anticoagulant/Antiplatelet PTA/Inpatient Med List Review by Pharmacist    Procedure: L and R perc nephrostomy placement    Completed: 12/29 around 1337  Post-Procedural bleeding risk per IR MD assessment:  standard  Antithrombotic medications on inpatient or PTA profile prior to procedure:   SQ UFH 5000 units q8    Recommended restart time per IR Post-Procedure Guidelines:  At 6 hours post op or at next standard dosing interval   Other considerations:      Plan:    Start SQ UFH 5000 units q8 tonight at [place], PharmD, BCPS 11/04/2018 1:48 PM

## 2018-11-04 NOTE — Procedures (Signed)
  Procedure: US/fluoro guided L and R perc nephrostomy placement 80f EBL:   minimal Complications:  none immediate  See full dictation in [facility].  Dillard Cannon MD Main # [phone] Pager  [phone]

## 2018-11-04 NOTE — Progress Notes (Signed)
Subjective/Chief Complaint: In pretty good spirits this morning.  Has had a couple of bowel movements.  Has low back pain unchanged but denies current abdominal pain or nausea.  Minimal vaginal drainage between bowel movements which is not really a major problem for her.   Objective: Vital signs in last 24 hours: Temp:  [99.1 F (37.3 C)-99.8 F (37.7 C)] 99.1 F (37.3 C) (12/29 0614) Pulse Rate:  [96-122] 122 (12/29 0614) Resp:  [17] 17 (12/29 0614) BP: (127-146)/(83-127) 127/83 (12/29 0614) SpO2:  [98 %-100 %] 98 % (12/29 0614) Last BM Date: 11/04/18  Intake/Output from previous day: 12/28 0701 - 12/29 0700 In: 197.3 [I.V.:97.3; IV Piggyback:100] Out: 750 [Urine:750] Intake/Output this shift: No intake/output data recorded.  General appearance: alert, cooperative and no distress GI: Moderately obese.  Soft and nontender.  Lab Results:  Recent Labs    11/03/18 0910 11/04/18 0449  WBC 1.5* 2.6*  HGB 7.8* 8.2*  HCT 25.3* 27.0*  PLT 240 303   BMET Recent Labs    11/03/18 0425 11/04/18 0449  NA 132* 134*  K 4.8 4.8  CL 105 106  CO2 17* 16*  GLUCOSE 119* 107*  BUN 60* 58*  CREATININE 3.74* 3.70*  CALCIUM 8.5* 8.6*   PT/INR Recent Labs    11/03/18 0425 11/04/18 0449  LABPROT 17.5* 17.2*  INR 1.46 1.42   ABG No results for input(s): PHART, HCO3 in the last 72 hours.  Invalid input(s): PCO2, PO2  Studies/Results: No results found.  Anti-infectives: Anti-infectives (From admission, onward)   Start     Dose/Rate Route Frequency Ordered Stop   10/31/18 2000  ceFEPIme (MAXIPIME) 2 g in sodium chloride 0.9 % 100 mL IVPB     2 g 200 mL/hr over 30 Minutes Intravenous Every 24 hours 10/31/18 0812     10/27/18 2000  ceFEPIme (MAXIPIME) 2 g in sodium chloride 0.9 % 100 mL IVPB  Status:  Discontinued     2 g 200 mL/hr over 30 Minutes Intravenous Every 12 hours 10/27/18 1643 10/31/18 0812   10/27/18 1000  bictegravir-emtricitabine-tenofovir AF (BIKTARVY)  50-200-25 MG per tablet 1 tablet  Status:  Discontinued     1 tablet Oral Daily 10/26/18 2209 10/31/18 0836   10/27/18 1000  ceFEPIme (MAXIPIME) 1 g in sodium chloride 0.9 % 100 mL IVPB  Status:  Discontinued     1 g 200 mL/hr over 30 Minutes Intravenous Every 12 hours 10/27/18 0335 10/27/18 1643   10/26/18 2230  metroNIDAZOLE (FLAGYL) IVPB 500 mg     500 mg 100 mL/hr over 60 Minutes Intravenous Every 8 hours 10/26/18 2212     10/26/18 2215  ceFEPIme (MAXIPIME) 2 g in sodium chloride 0.9 % 100 mL IVPB     2 g 200 mL/hr over 30 Minutes Intravenous  Once 10/26/18 2212 10/27/18 0106      Assessment/Plan: 1. 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 Left perirectal fluid collection - abscess vs necrotic tumor WBC - 11,700 - 10/30/2018 - repeat pending On Cefepime/Flagyl             Also on chemotherapy - would avoid surgery at this time for this reason  3. R hydronephrosis/hydroureter - s/p stenting Managed by Dr. Louis Meckel Creatinine - 3.55 Renal US shows left perinephric fluid - 11/01/2018 Urology considering perc nephrostomy of left side now given rapid onset urinoma/forniceal rupture 4. Hypothyroidism -  synthroid 5. HIV/AIDS - last CD4 in August 130 6. DVT prophylaxis - SQ Heparin  Principal Problem: Presacral pelvic abscess from necrotic cervical cancer &rectovaginal fistula Active Problems: Primary cervical cancer with metastasis - Stage IV Diabetes mellitus without complication (HCC) Anemia, blood loss HIV (human immunodeficiency virus infection) ([place]) Hydronephrosis due to cancer obstruction s/p right ureter stenting 10/25/2018 Back pain Rectovaginal fistula from cervical  cancer Subclinical hypothyroidism Port-A-Cath in place History of external beam radiation therapy Palliative care by specialist AKI (acute kidney injury) ([place])  No urgent need for surgical intervention (diverting colostomy) and likely dangerous currently due to recovery from chemotherapy.  Overall she seems to be doing better.  We will check on her intermittently.   LOS: 9 days    Edward Jolly 11/04/2018

## 2018-11-04 NOTE — Progress Notes (Signed)
Pt taken off unit to IR. Pt in stable condition at this time.

## 2018-11-05 LAB — CBC WITH DIFFERENTIAL/PLATELET
Abs Immature Granulocytes: 0.1 10*3/uL — ABNORMAL HIGH (ref 0.00–0.07)
Basophils Absolute: 0 10*3/uL (ref 0.0–0.1)
Basophils Relative: 0 %
Eosinophils Absolute: 0.1 10*3/uL (ref 0.0–0.5)
Eosinophils Relative: 1 %
HEMATOCRIT: 25 % — AB (ref 36.0–46.0)
Hemoglobin: 7.8 g/dL — ABNORMAL LOW (ref 12.0–15.0)
Immature Granulocytes: 2 %
Lymphocytes Relative: 12 %
Lymphs Abs: 0.6 10*3/uL — ABNORMAL LOW (ref 0.7–4.0)
MCH: 30 pg (ref 26.0–34.0)
MCHC: 31.2 g/dL (ref 30.0–36.0)
MCV: 96.2 fL (ref 80.0–100.0)
Monocytes Absolute: 0.7 10*3/uL (ref 0.1–1.0)
Monocytes Relative: 13 %
Neutro Abs: 3.7 10*3/uL (ref 1.7–7.7)
Neutrophils Relative %: 72 %
Platelets: 294 10*3/uL (ref 150–400)
RBC: 2.6 MIL/uL — ABNORMAL LOW (ref 3.87–5.11)
RDW: 15.3 % (ref 11.5–15.5)
WBC: 5.2 10*3/uL (ref 4.0–10.5)
nRBC: 0.6 % — ABNORMAL HIGH (ref 0.0–0.2)

## 2018-11-05 LAB — BASIC METABOLIC PANEL
Anion gap: 10 (ref 5–15)
BUN: 41 mg/dL — ABNORMAL HIGH (ref 6–20)
CO2: 17 mmol/L — ABNORMAL LOW (ref 22–32)
CREATININE: 2 mg/dL — AB (ref 0.44–1.00)
Calcium: 8.6 mg/dL — ABNORMAL LOW (ref 8.9–10.3)
Chloride: 109 mmol/L (ref 98–111)
GFR calc Af Amer: 32 mL/min — ABNORMAL LOW (ref >60)
GFR calc non Af Amer: 28 mL/min — ABNORMAL LOW (ref >60)
Glucose, Bld: 93 mg/dL (ref 70–99)
Potassium: 4.2 mmol/L (ref 3.5–5.1)
Sodium: 136 mmol/L (ref 135–145)

## 2018-11-05 MED ORDER — SIMETHICONE 80 MG PO CHEW
80.0000 mg | CHEWABLE_TABLET | Freq: Four times a day (QID) | ORAL | Status: DC | PRN
  Administered 2018-11-05 – 2018-11-06 (×2): 80 mg via ORAL
  Filled 2018-11-05 (×2): qty 1

## 2018-11-05 MED ORDER — HYDROMORPHONE HCL 1 MG/ML IJ SOLN
0.5000 mg | INTRAMUSCULAR | Status: DC | PRN
  Administered 2018-11-05 – 2018-11-07 (×16): 0.5 mg via INTRAVENOUS
  Filled 2018-11-05 (×16): qty 0.5

## 2018-11-05 MED ORDER — HEPARIN SODIUM (PORCINE) 5000 UNIT/ML IJ SOLN
5000.0000 [IU] | Freq: Three times a day (TID) | INTRAMUSCULAR | Status: DC
  Administered 2018-11-05 – 2018-11-09 (×13): 5000 [IU] via SUBCUTANEOUS
  Filled 2018-11-05 (×13): qty 1

## 2018-11-05 MED ORDER — MORPHINE SULFATE 15 MG PO TABS
30.0000 mg | ORAL_TABLET | ORAL | Status: DC | PRN
  Administered 2018-11-05 – 2018-11-09 (×10): 30 mg via ORAL
  Filled 2018-11-05 (×10): qty 2

## 2018-11-05 NOTE — Progress Notes (Signed)
   11/05/18 1600  Clinical Encounter Type  Visited With Other (Comment)  Visit Type Follow-up  Referral From Palliative care team  Consult/Referral To Chaplain  When the chaplain arrived for F/U spiritual care visit, family and friends were standing outside of the Pt. room.  The chaplain chose to visit with Pt. at another time.

## 2018-11-05 NOTE — Progress Notes (Signed)
PROGRESS NOTE  Misty Thomas FKC:[id number] DOB: 01/18/1966 DOA: 10/26/2018 PCP: Patient, No Pcp Per  Brief Narrative: 52 year old woman PMH HIV/AIDS, cervical cancer with metastatic disease, recent cystoscopy with stent placement for right hydronephrosis presented with chills, shortness of breath, blood in stools.  Admitted for perirectal and peri-sacral abscess.  Seen by general surgery, GYN oncology, oncology, palliative medicine, urology.  Initially conservative management was recommended although diverting colostomy was considered.  Subsequently creatinine worsened and CT scan revealed forniceal rupture and left urinoma.  With mild to moderate right hydronephrosis with stent in place.  She underwent bilateral percutaneous nephrostomy placement 12/29.  Renal function improving 12/30.  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  Complications include necrotic tumor, vaginal rectal fistula, possible infection/abscess, severe pain --General surgery has signed off as of today, continues to recommend conservative management, no surgery. --Poor prognosis.  Palliative medicine did see earlier in hospitalization patient desire was for full care. --Continue cefepime and metronidazole, will transition to Augmentin in the next 1-2 days, discussed with Dr. Johnnye Sima.  Will treat an additional week.  Acute kidney injury, secondary to right hydronephrosis, hydroureter, left forniceal rupture and urinoma.  Right greater than left hydronephrosis with stent placement initially 12/18.  Bilateral nephrostomy tube placement 12/29. --Renal function improving status post nephrostomy tube placements.   --BMP in a.m.  Multifactorial anemia, including anemia of chronic kidney disease, malignancy, blood loss. --Has been stable.  Follow clinically.  Cancer associated  pain --Continue present regimen  HIV/AIDS --Regimen put on hold by previous attending physician for worsening renal function.  Should be able to resume tomorrow if renal function continues to improve.  Diabetes mellitus type 2, diet-controlled --CBG remains stable..  TSH elevated --Follow-up as an outpatient.   Renal function improving.  If continues to improve, likely will be able to leave the hospital in next 48 hours.  Will consult physical therapy.  DVT prophylaxis: Heparin Code Status: Full Family Communication:  Disposition Plan: Pending   Murray Hodgkins, MD  [facility] Direct contact: [phone] --Via amion app OR  --[email or web address]; password [email or web address]  7PM-7AM contact night coverage as above 11/05/2018, 3:22 PM  LOS: 10 days   Consultants:  General surgery  GYN oncology  PMT  Procedures:   US/fluoro guided L and R perc nephrostomy placement 73f  Antimicrobials:  Cefepime 12/20 >  Metronidazole 12/20 >  Interval history/Subjective: Increased pain status post nephrostomy tube placements yesterday, and back, as well as abdomen.  Objective: Vitals:  Vitals:   11/05/18 0512 11/05/18 1300  BP: 132/84 (!) 128/91  Pulse: (!) 101 (!) 103  Resp: 19 14  Temp: 98.7 F (37.1 C) 99 F (37.2 C)  SpO2: 95% 95%    Exam:  Constitutional:   . Appears calm, uncomfortable, nontoxic ENMT:  . grossly normal hearing  Respiratory:  . CTA bilaterally, no w/r/r.  . Respiratory effort normal.  Cardiovascular:  . RRR, no m/r/g . No LE extremity edema   Abdomen:  . Soft, mild generalized tenderness Psychiatric:  . Mental status o Mood, affect appropriate  I have personally reviewed the following:   Data: . Creatinine much improved, 2.0.  BUN trending down, 41.  Potassium within normal limits. . WBC 5.2, hemoglobin 7.8.  Platelets within normal limits.  Scheduled Meds: . feeding supplement  1 Container Oral TID BM  . folic acid  1 mg Oral Daily   . heparin  injection (subcutaneous)  5,000 Units Subcutaneous Q8H  . levothyroxine  25 mcg Oral Q0600  . magnesium oxide  400 mg Oral BID  . morphine  60 mg Oral Q12H  . polyethylene glycol  17 g Oral Daily  . psyllium  1 packet Oral Daily  . senna-docusate  1 tablet Oral BID  . sodium chloride flush  10-40 mL Intracatheter Q12H  . sodium chloride flush  5 mL Intracatheter Q8H   Continuous Infusions: . sodium chloride 250 mL (11/04/18 1857)  . ceFEPime (MAXIPIME) IV 2 g (11/04/18 2009)  . 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

## 2018-11-05 NOTE — Progress Notes (Signed)
Referring Physician(s): Amin,A  Supervising Physician: Sandi Mariscal  Patient Status:  [facility] - In-pt  Chief Complaint: Flank pain, bilateral hydronephrosis, cervical cancer   Subjective: Patient with bilateral flank soreness post nephrostomy tube placements yesterday   Allergies: Patient has no known allergies.  Medications: Prior to Admission medications   Medication Sig Start Date End Date Taking? Authorizing Provider  acetaminophen (TYLENOL) 500 MG tablet Take 1,000 mg by mouth every 6 (six) hours as needed for moderate pain or headache.    Yes [provider]  BIKTARVY 50-200-25 MG TABS tablet TAKE 1 TABLET BY MOUTH DAILY. 09/14/18  Yes Comer, Okey Regal, MD  dexamethasone (DECADRON) 4 MG tablet Take 5 tabs at the night before and 5 tab the morning of chemotherapy, every 3 weeks, by mouth 10/15/18  Yes Gorsuch, Ni, MD  levothyroxine (SYNTHROID, LEVOTHROID) 25 MCG tablet Take 1 tablet (25 mcg total) by mouth daily before breakfast. 02/08/18  Yes Alvy Bimler, Ni, MD  lidocaine-prilocaine (EMLA) cream Apply to affected area once 10/26/18  Yes Gorsuch, Ni, MD  magnesium oxide (MAG-OX) 400 (241.3 Mg) MG tablet Take 1 tablet (400 mg total) by mouth 2 (two) times daily. 03/08/18  Yes Gorsuch, Ni, MD  morphine (MS CONTIN) 60 MG 12 hr tablet Take 1 tablet (60 mg total) by mouth every 12 (twelve) hours. 10/15/18  Yes Gorsuch, Ni, MD  morphine (MSIR) 30 MG tablet Take 1 tablet (30 mg total) by mouth every 6 (six) hours as needed for severe pain. 10/15/18  Yes [name], MD  phenazopyridine (PYRIDIUM) 200 MG tablet Take 1 tablet (200 mg total) by mouth 3 (three) times daily as needed for pain. 10/24/18  Yes Ardis Hughs, MD  polyethylene glycol [facility] / Floria Raveling) packet Take 17 g by mouth daily.   Yes [provider]  ondansetron (ZOFRAN) 8 MG tablet Take 1 tablet (8 mg total) by mouth 2 (two) times daily as needed for refractory nausea / vomiting. Start on day 3 after chemo.  10/26/18   [name], MD  prochlorperazine (COMPAZINE) 10 MG tablet Take 1 tablet (10 mg total) by mouth every 6 (six) hours as needed (Nausea or vomiting). 10/26/18   [name], MD     Vital Signs: BP 132/84 (BP Location: Right Arm)   Pulse (!) 101   Temp 98.7 F (37.1 C) (Oral)   Resp 19   Ht 5\' 1"  (1.549 m)   Wt 189 lb 9.5 oz (86 kg)   LMP 02/03/2018   SpO2 95%   BMI 35.82 kg/m   Physical Exam awake, alert.  Bilateral nephrostomy tubes in place, draining blood-tinged urine, right 450 cc, left 1.1 L; mild to moderate tenderness to palpation at insertion sites  Imaging: Ct Abdomen Pelvis Wo Contrast  Result Date: 11/01/2018 CLINICAL DATA:  HIV, cervical cancer on chemotherapy. Lower back pain. Hydronephrosis with worsening renal function. EXAM: CT ABDOMEN AND PELVIS WITHOUT CONTRAST TECHNIQUE: Multidetector CT imaging of the abdomen and pelvis was performed following the standard protocol without IV contrast. COMPARISON:  10/26/2018. FINDINGS: Lower chest: Small left pleural effusion with collapse/consolidation in the left lower lobe, new from 10/26/2018. Trace right pleural fluid. Decreased attenuation of the intravascular compartment is indicative of anemia. Heart size normal. No pericardial effusion. Hepatobiliary: Liver and gallbladder are unremarkable. No biliary ductal dilatation. Pancreas: Negative. Spleen: Negative. Adrenals/Urinary Tract: Adrenal glands are unremarkable. Double-J right ureteral stent is seen with the proximal loop formed in the right renal pelvis and distal loop formed  in the bladder. Mild to moderate residual right hydronephrosis, similar to 10/26/2018. Extensive left perinephric fluid, new from 10/26/2018. Moderate left hydronephrosis. Fluid density lesion in the left kidney measures 4.1 cm, as before. Bladder is grossly unremarkable. Stomach/Bowel: Stomach and small bowel are unremarkable. Appendix is not readily visualized. Stool is seen throughout the  colon. Vascular/Lymphatic: Vascular structures are grossly unremarkable. No definite pathologically enlarged lymph nodes. Reproductive: Uterus is visualized. Assess in the patient's cervical cancer is limited without IV contrast. Other: Left perirectal/presacral fluid collection measures approximately 2.7 x 3.7 cm, stable. Associated presacral thickening. Musculoskeletal: Degenerative changes in the spine. No worrisome lytic or sclerotic lesions. IMPRESSION: 1. Large left perinephric fluid collection is new from 10/26/2018 and most likely represents a urinoma related to hydronephrosis and forniceal rupture. 2. Mild to moderate right hydronephrosis with double-J ureteral stent in place. 3. Left perirectal/presacral fluid collection is grossly stable. Abscess is not excluded. 4. New small left pleural effusion. Associated collapse/consolidation in the left lower lobe is likely due to atelectasis. Pneumonia is not excluded. 5. Stool throughout the colon is indicative of constipation. Electronically Signed   By: Lorin Picket M.D.   On: 11/01/2018 16:38   Ir Nephrostomy Placement Left  Result Date: 11/04/2018 CLINICAL DATA:  Cervical carcinoma with low back pain, hydronephrosis, worsening renal function despite placement of right ureteral stent. CT also demonstrates left perinephric collection suggesting forniceal rupture. Percutaneous nephrostomy decompression requested. EXAM: BILATERAL PERCUTANEOUS NEPHROSTOMY CATHETER PLACEMENT UNDER ULTRASOUND AND FLUOROSCOPIC GUIDANCE FLUOROSCOPY TIME:  2.3 minute; 381 uGym2 DAP TECHNIQUE: The procedure, risks (including but not limited to bleeding, infection, organ damage ), benefits, and alternatives were explained to the patient. Questions regarding the procedure were encouraged and answered. The patient understands and consents to the procedure. Bilateralflank regions prepped with chlorhexidine, draped in usual sterile fashion, infiltrated locally with 1% lidocaine.No  periprocedural antibiotics prophylaxis was indicated. Intravenous Fentanyl and Versed were administered as conscious sedation during continuous monitoring of the patient's level of consciousness and physiological / cardiorespiratory status by the radiology RN, with a total moderate sedation time of 20 minutes. Under real-time ultrasound guidance, a 21-gauge trocar needle was advanced into a posterior left lower pole calyx. Ultrasound image documentation was saved. Guidewire advanced easily into the renal collecting system and down the proximal ureter. Needle was exchanged over a guidewire for transitional dilator. Contrast injection confirmed appropriate positioning. Catheter was exchanged over a guidewire for a 10 French pigtail catheter, formed centrally within the left renal collecting system. Contrast injection confirms appropriate positioning and patency. In similar fashion, on the right, Under real-time ultrasound guidance, a 21-gauge trocar needle was advanced into a posterior lower pole calyx. Ultrasound image documentation was saved. Urinary returned spontaneously through the needle hub. Guidewire advanced easily into the renal collecting system . Needle was exchanged over a guidewire for transitional dilator. Contrast injection confirmed appropriate positioning. Catheter was exchanged over a guidewire for a 10 French pigtail catheter, formed centrally within the right renal collecting system. Contrast injection confirms appropriate positioning and patency. Catheters secured externally with 0 Prolene suture and StatLock, and placed to external drain bags. The patient tolerated the procedure well. COMPLICATIONS: COMPLICATIONS none IMPRESSION: 1. Technically successful bilateral percutaneous nephrostomy catheter placement. Electronically Signed   By: Lucrezia Europe M.D.   On: 11/04/2018 15:13   Ir Nephrostomy Placement Right  Result Date: 11/04/2018 CLINICAL DATA:  Cervical carcinoma with low back pain,  hydronephrosis, worsening renal function despite placement of right ureteral stent. CT also demonstrates left  perinephric collection suggesting forniceal rupture. Percutaneous nephrostomy decompression requested. EXAM: BILATERAL PERCUTANEOUS NEPHROSTOMY CATHETER PLACEMENT UNDER ULTRASOUND AND FLUOROSCOPIC GUIDANCE FLUOROSCOPY TIME:  2.3 minute; 381 uGym2 DAP TECHNIQUE: The procedure, risks (including but not limited to bleeding, infection, organ damage ), benefits, and alternatives were explained to the patient. Questions regarding the procedure were encouraged and answered. The patient understands and consents to the procedure. Bilateralflank regions prepped with chlorhexidine, draped in usual sterile fashion, infiltrated locally with 1% lidocaine.No periprocedural antibiotics prophylaxis was indicated. Intravenous Fentanyl and Versed were administered as conscious sedation during continuous monitoring of the patient's level of consciousness and physiological / cardiorespiratory status by the radiology RN, with a total moderate sedation time of 20 minutes. Under real-time ultrasound guidance, a 21-gauge trocar needle was advanced into a posterior left lower pole calyx. Ultrasound image documentation was saved. Guidewire advanced easily into the renal collecting system and down the proximal ureter. Needle was exchanged over a guidewire for transitional dilator. Contrast injection confirmed appropriate positioning. Catheter was exchanged over a guidewire for a 10 French pigtail catheter, formed centrally within the left renal collecting system. Contrast injection confirms appropriate positioning and patency. In similar fashion, on the right, Under real-time ultrasound guidance, a 21-gauge trocar needle was advanced into a posterior lower pole calyx. Ultrasound image documentation was saved. Urinary returned spontaneously through the needle hub. Guidewire advanced easily into the renal collecting system . Needle was  exchanged over a guidewire for transitional dilator. Contrast injection confirmed appropriate positioning. Catheter was exchanged over a guidewire for a 10 French pigtail catheter, formed centrally within the right renal collecting system. Contrast injection confirms appropriate positioning and patency. Catheters secured externally with 0 Prolene suture and StatLock, and placed to external drain bags. The patient tolerated the procedure well. COMPLICATIONS: COMPLICATIONS none IMPRESSION: 1. Technically successful bilateral percutaneous nephrostomy catheter placement. Electronically Signed   By: Lucrezia Europe M.D.   On: 11/04/2018 15:13    Labs:  CBC: Recent Labs    11/03/18 0425 11/03/18 0910 11/04/18 0449 11/05/18 0345  WBC QUESTIONABLE RESULTS, RECOMMEND RECOLLECT TO VERIFY 1.5* 2.6* 5.2  HGB QUESTIONABLE RESULTS, RECOMMEND RECOLLECT TO VERIFY 7.8* 8.2* 7.8*  HCT QUESTIONABLE RESULTS, RECOMMEND RECOLLECT TO VERIFY 25.3* 27.0* 25.0*  PLT QUESTIONABLE RESULTS, RECOMMEND RECOLLECT TO VERIFY 240 303 294    COAGS: Recent Labs    02/01/18 0850 11/03/18 0425 11/04/18 0449  INR 1.03 1.46 1.42    BMP: Recent Labs    11/02/18 0420 11/03/18 0425 11/04/18 0449 11/05/18 0900  NA 133* 132* 134* 136  K 4.8 4.8 4.8 4.2  CL 107 105 106 109  CO2 16* 17* 16* 17*  GLUCOSE 118* 119* 107* 93  BUN 60* 60* 58* 41*  CALCIUM 8.5* 8.5* 8.6* 8.6*  CREATININE 3.55* 3.74* 3.70* 2.00*  GFRNONAA 14* 13* 13* 28*  GFRAA 16* 15* 15* 32*    LIVER FUNCTION TESTS: Recent Labs    10/15/18 1125 10/25/18 1159 10/26/18 1702 10/28/18 0420  BILITOT 0.4 0.3 0.6 0.8  AST 10* 15 16 16   ALT 6 8 9 11   ALKPHOS 84 92 77 55  PROT 8.9* 9.3* 8.3* 7.0  ALBUMIN 3.0* 3.0* 3.0* 2.8*    Assessment and Plan: Patient with history of HIV, stage IV metastatic cervical cancer with rectovaginal fistula, right ureteral stent placement with persistent hydronephrosis, left perinephric fluid collection with hydronephrosis,  renal insufficiency; status post bilateral percutaneous nephrostomies on 12/29; afebrile, creatinine 2(3.7), WBC 5.2, hemoglobin 7.8(8.2); pain meds per primary care team/oncology;  continue current treatment; additional plans as per urology. Palliative care also following.  Conservative management recommended.   Electronically Signed: D. Rowe Robert, PA-C 11/05/2018, 11:47 AM   I spent a total of 15 minutes at the the patient's bedside AND on the patient's hospital floor or unit, greater than 50% of which was counseling/coordinating care for bilateral nephrostomies    Patient ID: Misty Thomas, female   DOB: 1966/01/30, 52 y.o.   MRN: [id number]

## 2018-11-05 NOTE — Progress Notes (Signed)
Misty Thomas   DOB:1966/07/13   TJ#:[id number]    Assessment & Plan:   Recurrent, locally advanced cervical cancer She has received first dose of chemotherapy on October 26, 2018. Continue supportive care  Necrotic tumor with vaginal/rectal fistula, probable infection/abscess It is hard to interpret her imaging study but the patient likely have necrotic tumor complicated by fistula. She has received broad-spectrum IV antibiotics I had extensive discussion with GYN oncologist and general surgeryon 12/23about the role of palliative, diversion colostomy. She would be at very high risk of complication with infection and probable wound healing given prior history of radiation, recent chemotherapy, background history of HIV infection and protein calorie malnutrition. For now, I favor conservative management with IV antibiotics and supportive care  HIV infection She will continue antiretroviral treatment  Cancer related pain Due to infection, disease and fistula.  She has additional pain from recent procedure She will continue to receive intermittent doses of pain medicine as needed  Altered mental status/confusion,somewhat sedated, resolved Suspect it could be related to pain medicine. Recent CT imaging of the head showedno evidence of intracranial metastasis  Severe constipation, resolved Multifactorial, could be due to disease and side effects of medication Recommendcontinue onaggressive laxative therapy  Leukopenia, resolved She has received G-CSF support over the weekend.  I will discontinue Granix  Acute onchronic renal failure Due to hydronephrosis and disease,or possibility would include side effects of antibiotic treatment or recent CT scan with contrast She had stent placement recently. She had bilateral percutaneous nephrostomy tube placement.  If renal failure does not improve, please consult nephrology service for further management  Chronicanemia Due to  recent renal disease and chemotherapy, with additional bleeding from recent surgery Plan to recheck CBC tomorrow.  If hemoglobin start trending down, she will need blood transfusion.  Moderate protein calorie malnutrition Due to her disease Nutritional supplement as needed  Goals of care discussion She hadnumerous discussionswith surgicalteams In my opinion, I would favor conservative management right now rather than diversion colostomy due to high risk of complication. Appreciate palliative care consult for symptom management  Discharge planning Not ready for dischargedue to worsening renal failure.   [name], MD 11/05/2018  7:42 AM   Subjective:  She is complaining of a lot of pain from recent nephrostomy tube placement.  She is noted to be afebrile She denies nausea or changes in bowel habits.  She felt that pain control is not optimal due to new onset of different pain from surgical incisions from nephrostomy tube placement.  Her pelvic pain is stable  Objective:  Vitals:   11/04/18 2024 11/05/18 0512  BP: 132/81 132/84  Pulse: (!) 110 (!) 101  Resp: 18 19  Temp: 99.2 F (37.3 C) 98.7 F (37.1 C)  SpO2: 97% 95%     Intake/Output Summary (Last 24 hours) at 11/05/2018 0762 Last data filed at 11/05/2018 0600 Gross per 24 hour  Intake 300 ml  Output 1550 ml  Net -1250 ml    GENERAL:alert, in mild distress from pain SKIN: skin color, texture, turgor are normal, no rashes or significant lesions EYES: normal, Conjunctiva are pale and non-injected, sclera clear OROPHARYNX:no exudate, no erythema and lips, buccal mucosa, and tongue normal  NECK: supple, thyroid normal size, non-tender, without nodularity LYMPH:  no palpable lymphadenopathy in the cervical, axillary or inguinal LUNGS: clear to auscultation and percussion with normal breathing effort HEART: regular rate & rhythm and no murmurs and no lower extremity edema ABDOMEN:abdomen soft, non-tender and  normal  bowel sounds.  Bilateral nephrostomy tubes present, draining blood tinged urine Musculoskeletal:no cyanosis of digits and no clubbing  NEURO: alert & oriented x 3 with fluent speech, no focal motor/sensory deficits   Labs:  Lab Results  Component Value Date   WBC 5.2 11/05/2018   HGB 7.8 (L) 11/05/2018   HCT 25.0 (L) 11/05/2018   MCV 96.2 11/05/2018   PLT 294 11/05/2018   NEUTROABS 3.7 11/05/2018    Lab Results  Component Value Date   NA 134 (L) 11/04/2018   K 4.8 11/04/2018   CL 106 11/04/2018   CO2 16 (L) 11/04/2018    Studies:  Ir Nephrostomy Placement Left  Result Date: 11/04/2018 CLINICAL DATA:  Cervical carcinoma with low back pain, hydronephrosis, worsening renal function despite placement of right ureteral stent. CT also demonstrates left perinephric collection suggesting forniceal rupture. Percutaneous nephrostomy decompression requested. EXAM: BILATERAL PERCUTANEOUS NEPHROSTOMY CATHETER PLACEMENT UNDER ULTRASOUND AND FLUOROSCOPIC GUIDANCE FLUOROSCOPY TIME:  2.3 minute; 381 uGym2 DAP TECHNIQUE: The procedure, risks (including but not limited to bleeding, infection, organ damage ), benefits, and alternatives were explained to the patient. Questions regarding the procedure were encouraged and answered. The patient understands and consents to the procedure. Bilateralflank regions prepped with chlorhexidine, draped in usual sterile fashion, infiltrated locally with 1% lidocaine.No periprocedural antibiotics prophylaxis was indicated. Intravenous Fentanyl and Versed were administered as conscious sedation during continuous monitoring of the patient's level of consciousness and physiological / cardiorespiratory status by the radiology RN, with a total moderate sedation time of 20 minutes. Under real-time ultrasound guidance, a 21-gauge trocar needle was advanced into a posterior left lower pole calyx. Ultrasound image documentation was saved. Guidewire advanced easily into the  renal collecting system and down the proximal ureter. Needle was exchanged over a guidewire for transitional dilator. Contrast injection confirmed appropriate positioning. Catheter was exchanged over a guidewire for a 10 French pigtail catheter, formed centrally within the left renal collecting system. Contrast injection confirms appropriate positioning and patency. In similar fashion, on the right, Under real-time ultrasound guidance, a 21-gauge trocar needle was advanced into a posterior lower pole calyx. Ultrasound image documentation was saved. Urinary returned spontaneously through the needle hub. Guidewire advanced easily into the renal collecting system . Needle was exchanged over a guidewire for transitional dilator. Contrast injection confirmed appropriate positioning. Catheter was exchanged over a guidewire for a 10 French pigtail catheter, formed centrally within the right renal collecting system. Contrast injection confirms appropriate positioning and patency. Catheters secured externally with 0 Prolene suture and StatLock, and placed to external drain bags. The patient tolerated the procedure well. COMPLICATIONS: COMPLICATIONS none IMPRESSION: 1. Technically successful bilateral percutaneous nephrostomy catheter placement. Electronically Signed   By: Lucrezia Europe M.D.   On: 11/04/2018 15:13   Ir Nephrostomy Placement Right  Result Date: 11/04/2018 CLINICAL DATA:  Cervical carcinoma with low back pain, hydronephrosis, worsening renal function despite placement of right ureteral stent. CT also demonstrates left perinephric collection suggesting forniceal rupture. Percutaneous nephrostomy decompression requested. EXAM: BILATERAL PERCUTANEOUS NEPHROSTOMY CATHETER PLACEMENT UNDER ULTRASOUND AND FLUOROSCOPIC GUIDANCE FLUOROSCOPY TIME:  2.3 minute; 381 uGym2 DAP TECHNIQUE: The procedure, risks (including but not limited to bleeding, infection, organ damage ), benefits, and alternatives were explained to the  patient. Questions regarding the procedure were encouraged and answered. The patient understands and consents to the procedure. Bilateralflank regions prepped with chlorhexidine, draped in usual sterile fashion, infiltrated locally with 1% lidocaine.No periprocedural antibiotics prophylaxis was indicated. Intravenous Fentanyl and Versed were administered as conscious sedation  during continuous monitoring of the patient's level of consciousness and physiological / cardiorespiratory status by the radiology RN, with a total moderate sedation time of 20 minutes. Under real-time ultrasound guidance, a 21-gauge trocar needle was advanced into a posterior left lower pole calyx. Ultrasound image documentation was saved. Guidewire advanced easily into the renal collecting system and down the proximal ureter. Needle was exchanged over a guidewire for transitional dilator. Contrast injection confirmed appropriate positioning. Catheter was exchanged over a guidewire for a 10 French pigtail catheter, formed centrally within the left renal collecting system. Contrast injection confirms appropriate positioning and patency. In similar fashion, on the right, Under real-time ultrasound guidance, a 21-gauge trocar needle was advanced into a posterior lower pole calyx. Ultrasound image documentation was saved. Urinary returned spontaneously through the needle hub. Guidewire advanced easily into the renal collecting system . Needle was exchanged over a guidewire for transitional dilator. Contrast injection confirmed appropriate positioning. Catheter was exchanged over a guidewire for a 10 French pigtail catheter, formed centrally within the right renal collecting system. Contrast injection confirms appropriate positioning and patency. Catheters secured externally with 0 Prolene suture and StatLock, and placed to external drain bags. The patient tolerated the procedure well. COMPLICATIONS: COMPLICATIONS none IMPRESSION: 1. Technically  successful bilateral percutaneous nephrostomy catheter placement. Electronically Signed   By: Lucrezia Europe M.D.   On: 11/04/2018 15:13

## 2018-11-05 NOTE — Progress Notes (Signed)
Patient ID: Misty Thomas, female   DOB: 02-17-1966, 52 y.o.   MRN: [id number]     Subjective: Some increased abdominal pain since placement of percutaneous nephrostomy yesterday.  No bowel movement since yesterday.  Some vaginal drainage but not excessive.  Objective: Vital signs in last 24 hours: Temp:  [98.5 F (36.9 C)-99.2 F (37.3 C)] 98.7 F (37.1 C) (12/30 0512) Pulse Rate:  [98-110] 101 (12/30 0512) Resp:  [16-20] 19 (12/30 0512) BP: (119-152)/(81-91) 132/84 (12/30 0512) SpO2:  [95 %-100 %] 95 % (12/30 0512) Last BM Date: 11/04/18  Intake/Output from previous day: 12/29 0701 - 12/30 0700 In: 300 [I.V.:200; IV Piggyback:100] Out: 1550 [Drains:1550] Intake/Output this shift: No intake/output data recorded.  General appearance: alert, cooperative and mild distress GI: Mild diffuse tenderness.  Nephrostomy tubes with clear drainage.  Lab Results:  Recent Labs    11/04/18 0449 11/05/18 0345  WBC 2.6* 5.2  HGB 8.2* 7.8*  HCT 27.0* 25.0*  PLT 303 294   BMET Recent Labs    11/03/18 0425 11/04/18 0449  NA 132* 134*  K 4.8 4.8  CL 105 106  CO2 17* 16*  GLUCOSE 119* 107*  BUN 60* 58*  CREATININE 3.74* 3.70*  CALCIUM 8.5* 8.6*     Studies/Results: Ir Nephrostomy Placement Left  Result Date: 11/04/2018 CLINICAL DATA:  Cervical carcinoma with low back pain, hydronephrosis, worsening renal function despite placement of right ureteral stent. CT also demonstrates left perinephric collection suggesting forniceal rupture. Percutaneous nephrostomy decompression requested. EXAM: BILATERAL PERCUTANEOUS NEPHROSTOMY CATHETER PLACEMENT UNDER ULTRASOUND AND FLUOROSCOPIC GUIDANCE FLUOROSCOPY TIME:  2.3 minute; 381 uGym2 DAP TECHNIQUE: The procedure, risks (including but not limited to bleeding, infection, organ damage ), benefits, and alternatives were explained to the patient. Questions regarding the procedure were encouraged and answered. The patient understands and consents  to the procedure. Bilateralflank regions prepped with chlorhexidine, draped in usual sterile fashion, infiltrated locally with 1% lidocaine.No periprocedural antibiotics prophylaxis was indicated. Intravenous Fentanyl and Versed were administered as conscious sedation during continuous monitoring of the patient's level of consciousness and physiological / cardiorespiratory status by the radiology RN, with a total moderate sedation time of 20 minutes. Under real-time ultrasound guidance, a 21-gauge trocar needle was advanced into a posterior left lower pole calyx. Ultrasound image documentation was saved. Guidewire advanced easily into the renal collecting system and down the proximal ureter. Needle was exchanged over a guidewire for transitional dilator. Contrast injection confirmed appropriate positioning. Catheter was exchanged over a guidewire for a 10 French pigtail catheter, formed centrally within the left renal collecting system. Contrast injection confirms appropriate positioning and patency. In similar fashion, on the right, Under real-time ultrasound guidance, a 21-gauge trocar needle was advanced into a posterior lower pole calyx. Ultrasound image documentation was saved. Urinary returned spontaneously through the needle hub. Guidewire advanced easily into the renal collecting system . Needle was exchanged over a guidewire for transitional dilator. Contrast injection confirmed appropriate positioning. Catheter was exchanged over a guidewire for a 10 French pigtail catheter, formed centrally within the right renal collecting system. Contrast injection confirms appropriate positioning and patency. Catheters secured externally with 0 Prolene suture and StatLock, and placed to external drain bags. The patient tolerated the procedure well. COMPLICATIONS: COMPLICATIONS none IMPRESSION: 1. Technically successful bilateral percutaneous nephrostomy catheter placement. Electronically Signed   By: Lucrezia Europe M.D.    On: 11/04/2018 15:13   Ir Nephrostomy Placement Right  Result Date: 11/04/2018 CLINICAL DATA:  Cervical carcinoma with low back pain,  hydronephrosis, worsening renal function despite placement of right ureteral stent. CT also demonstrates left perinephric collection suggesting forniceal rupture. Percutaneous nephrostomy decompression requested. EXAM: BILATERAL PERCUTANEOUS NEPHROSTOMY CATHETER PLACEMENT UNDER ULTRASOUND AND FLUOROSCOPIC GUIDANCE FLUOROSCOPY TIME:  2.3 minute; 381 uGym2 DAP TECHNIQUE: The procedure, risks (including but not limited to bleeding, infection, organ damage ), benefits, and alternatives were explained to the patient. Questions regarding the procedure were encouraged and answered. The patient understands and consents to the procedure. Bilateralflank regions prepped with chlorhexidine, draped in usual sterile fashion, infiltrated locally with 1% lidocaine.No periprocedural antibiotics prophylaxis was indicated. Intravenous Fentanyl and Versed were administered as conscious sedation during continuous monitoring of the patient's level of consciousness and physiological / cardiorespiratory status by the radiology RN, with a total moderate sedation time of 20 minutes. Under real-time ultrasound guidance, a 21-gauge trocar needle was advanced into a posterior left lower pole calyx. Ultrasound image documentation was saved. Guidewire advanced easily into the renal collecting system and down the proximal ureter. Needle was exchanged over a guidewire for transitional dilator. Contrast injection confirmed appropriate positioning. Catheter was exchanged over a guidewire for a 10 French pigtail catheter, formed centrally within the left renal collecting system. Contrast injection confirms appropriate positioning and patency. In similar fashion, on the right, Under real-time ultrasound guidance, a 21-gauge trocar needle was advanced into a posterior lower pole calyx. Ultrasound image documentation  was saved. Urinary returned spontaneously through the needle hub. Guidewire advanced easily into the renal collecting system . Needle was exchanged over a guidewire for transitional dilator. Contrast injection confirmed appropriate positioning. Catheter was exchanged over a guidewire for a 10 French pigtail catheter, formed centrally within the right renal collecting system. Contrast injection confirms appropriate positioning and patency. Catheters secured externally with 0 Prolene suture and StatLock, and placed to external drain bags. The patient tolerated the procedure well. COMPLICATIONS: COMPLICATIONS none IMPRESSION: 1. Technically successful bilateral percutaneous nephrostomy catheter placement. Electronically Signed   By: Lucrezia Europe M.D.   On: 11/04/2018 15:13    Anti-infectives: Anti-infectives (From admission, onward)   Start     Dose/Rate Route Frequency Ordered Stop   10/31/18 2000  ceFEPIme (MAXIPIME) 2 g in sodium chloride 0.9 % 100 mL IVPB     2 g 200 mL/hr over 30 Minutes Intravenous Every 24 hours 10/31/18 0812     10/27/18 2000  ceFEPIme (MAXIPIME) 2 g in sodium chloride 0.9 % 100 mL IVPB  Status:  Discontinued     2 g 200 mL/hr over 30 Minutes Intravenous Every 12 hours 10/27/18 1643 10/31/18 0812   10/27/18 1000  bictegravir-emtricitabine-tenofovir AF (BIKTARVY) 50-200-25 MG per tablet 1 tablet  Status:  Discontinued     1 tablet Oral Daily 10/26/18 2209 10/31/18 0836   10/27/18 1000  ceFEPIme (MAXIPIME) 1 g in sodium chloride 0.9 % 100 mL IVPB  Status:  Discontinued     1 g 200 mL/hr over 30 Minutes Intravenous Every 12 hours 10/27/18 0335 10/27/18 1643   10/26/18 2230  metroNIDAZOLE (FLAGYL) IVPB 500 mg     500 mg 100 mL/hr over 60 Minutes Intravenous Every 8 hours 10/26/18 2212     10/26/18 2215  ceFEPIme (MAXIPIME) 2 g in sodium chloride 0.9 % 100 mL IVPB     2 g 200 mL/hr over 30 Minutes Intravenous  Once 10/26/18 2212 10/27/18 0106      Assessment/Plan: 1.  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  Agree with continuing aggressive bowel regimen.  2. Left perirectal fluid collection - abscess vs necrotic tumor Some increased creatinine.  Status post left percutaneous nephrostomy yesterday.  3. Hypothyroidism - synthroid 4. HIV/AIDS - last CD4 in August 130 5. 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 Alvy Bimler that there is potential significant morbidity related to diverting colostomy.  She does not appear obstructed and has manageable vaginal drainage so I doubt this would provide much in the way of palliation.  This was discussed with the patient who understands and agrees.  Nothing right now to add to current care.  We will sign off but if the situation changes please reconsult and we would be happy to see her anytime.     LOS: 10 days    Edward Jolly 11/05/2018

## 2018-11-06 LAB — BASIC METABOLIC PANEL
Anion gap: 11 (ref 5–15)
BUN: 34 mg/dL — ABNORMAL HIGH (ref 6–20)
CO2: 16 mmol/L — ABNORMAL LOW (ref 22–32)
Calcium: 8.7 mg/dL — ABNORMAL LOW (ref 8.9–10.3)
Chloride: 110 mmol/L (ref 98–111)
Creatinine, Ser: 1.72 mg/dL — ABNORMAL HIGH (ref 0.44–1.00)
GFR calc Af Amer: 39 mL/min — ABNORMAL LOW (ref >60)
GFR, EST NON AFRICAN AMERICAN: 34 mL/min — AB (ref >60)
GLUCOSE: 92 mg/dL (ref 70–99)
Potassium: 4.2 mmol/L (ref 3.5–5.1)
Sodium: 137 mmol/L (ref 135–145)

## 2018-11-06 LAB — CBC WITH DIFFERENTIAL/PLATELET
Abs Immature Granulocytes: 0.74 10*3/uL — ABNORMAL HIGH (ref 0.00–0.07)
BASOS PCT: 0 %
Basophils Absolute: 0 10*3/uL (ref 0.0–0.1)
Eosinophils Absolute: 0.1 10*3/uL (ref 0.0–0.5)
Eosinophils Relative: 1 %
HCT: 27.2 % — ABNORMAL LOW (ref 36.0–46.0)
Hemoglobin: 8.3 g/dL — ABNORMAL LOW (ref 12.0–15.0)
Immature Granulocytes: 6 %
LYMPHS ABS: 0.9 10*3/uL (ref 0.7–4.0)
Lymphocytes Relative: 7 %
MCH: 29 pg (ref 26.0–34.0)
MCHC: 30.5 g/dL (ref 30.0–36.0)
MCV: 95.1 fL (ref 80.0–100.0)
MONOS PCT: 8 %
Monocytes Absolute: 1 10*3/uL (ref 0.1–1.0)
Neutro Abs: 9.7 10*3/uL — ABNORMAL HIGH (ref 1.7–7.7)
Neutrophils Relative %: 78 %
Platelets: 324 10*3/uL (ref 150–400)
RBC: 2.86 MIL/uL — ABNORMAL LOW (ref 3.87–5.11)
RDW: 15.6 % — ABNORMAL HIGH (ref 11.5–15.5)
WBC: 12.5 10*3/uL — ABNORMAL HIGH (ref 4.0–10.5)
nRBC: 0.2 % (ref 0.0–0.2)

## 2018-11-06 LAB — TYPE AND SCREEN
ABO/RH(D): O NEG
Antibody Screen: NEGATIVE

## 2018-11-06 MED ORDER — AMOXICILLIN-POT CLAVULANATE 875-125 MG PO TABS
1.0000 | ORAL_TABLET | Freq: Two times a day (BID) | ORAL | Status: DC
  Administered 2018-11-06 – 2018-11-09 (×6): 1 via ORAL
  Filled 2018-11-06 (×6): qty 1

## 2018-11-06 MED ORDER — SODIUM CHLORIDE 0.9 % IV SOLN
2.0000 g | Freq: Two times a day (BID) | INTRAVENOUS | Status: DC
  Administered 2018-11-06: 2 g via INTRAVENOUS
  Filled 2018-11-06 (×2): qty 2

## 2018-11-06 MED ORDER — BICTEGRAVIR-EMTRICITAB-TENOFOV 50-200-25 MG PO TABS
1.0000 | ORAL_TABLET | Freq: Every day | ORAL | Status: DC
  Administered 2018-11-06 – 2018-11-09 (×4): 1 via ORAL
  Filled 2018-11-06 (×4): qty 1

## 2018-11-06 NOTE — Progress Notes (Signed)
Nutrition Follow-up  DOCUMENTATION CODES:   Obesity unspecified  INTERVENTION:  - Continue Boost Breeze TID. - Continue to encourage PO intakes.  - Will continue to monitor for additional nutrition-related needs.  - Weigh patient today.    NUTRITION DIAGNOSIS:   Increased nutrient needs related to cancer and cancer related treatments, chronic illness as evidenced by estimated needs. -ongoing  GOAL:   Patient will meet greater than or equal to 90% of their needs -unmet  MONITOR:   PO intake, Supplement acceptance, Weight trends, Labs  ASSESSMENT:   52 year old with history of HIV, cervical cancer on chemotherapy, diabetes mellitus type 2, anemia came to the hospital with lower back pain.  She notes having back pain for some time even prior to her recent chest cystostomy with stent placement for right-sided hydronephrosis and hydroureter.  Admitted for concerns for rectal abscess, progression of her poorly differentiated cervical carcinoma and possible tumor necrosis.  Patient has not been weighed since admission (12/20). She was seen by a RD on 12/24 at which time she was consuming 0-25% of meals. Per review of flow sheet, she consumed 0-25% on 12/25 and 12/26 and no intakes documented since 12/26. She reports ongoing N/V and that last episode of emesis was this AM. Patient feels that some of N/V may be d/t food smells.   She requested that RD star items on menu that would be good choices. She had been drinking Boost Breeze more consistently but states that they often make her feel full. Encouraged her to use them to take PO medications.   Medications reviewed. Labs reviewed; BUN: 34 mg/dL, creatinine: 1.72 mg/dL, Ca: 8.7 mg/dL, GFR: 39 mL/min.      NUTRITION - FOCUSED PHYSICAL EXAM:    Most Recent Value  Orbital Region  No depletion  Upper Arm Region  No depletion  Thoracic and Lumbar Region  Unable to assess  Buccal Region  No depletion  Temple Region  No depletion   Clavicle Bone Region  No depletion  Clavicle and Acromion Bone Region  No depletion  Scapular Bone Region  Unable to assess  Dorsal Hand  No depletion  Patellar Region  No depletion  Anterior Thigh Region  Unable to assess  Posterior Calf Region  No depletion  Edema (RD Assessment)  Mild [BLE]  Hair  Unable to assess  Eyes  Reviewed  Mouth  Reviewed  Skin  Reviewed  Nails  Reviewed       Diet Order:   Diet Order            Diet regular Room service appropriate? Yes; Fluid consistency: Thin  Diet effective now              EDUCATION NEEDS:   Not appropriate for education at this time  Skin:  Skin Assessment: Reviewed RN Assessment  Last BM:  12/30  Height:   Ht Readings from Last 1 Encounters:  10/26/18 5\' 1"  (1.549 m)    Weight:   Wt Readings from Last 1 Encounters:  10/26/18 86 kg    Ideal Body Weight:  47.7 kg  BMI:  Body mass index is 35.82 kg/m.  Estimated Nutritional Needs:   Kcal:  1800-2000  Protein:  85-95g  Fluid:  2L/day     Jarome Matin, MS, RD, LDN, CNSC Inpatient Clinical Dietitian Pager # [phone] After hours/weekend pager # [phone]

## 2018-11-06 NOTE — Progress Notes (Signed)
Patient ID: Misty Thomas, female   DOB: 1966-10-06, 52 y.o.   MRN: [id number]  This NP visited patient at the bedside as a follow up for palliative medicine needs and emotional support.    Patient is lethargic but arouses and engages in conversation.  She had nephrostomy tubes placed and had increased pain over the weekend with need for increase in medications. Nursing is working closely with her today utilizing prns to get pain better controlled.  Nursing to call if current mediation regime is ineffective.  [name] verbalizes ongoing fear and anxiety. Emotional support offered.  Patient very receptive to deep breathing and arm/hand massage.  Patient is hopeful for continued quality life and is open to all offered and available medical interventions that may support that goal.  Discussed with patient the importance of continued conversation with her  family and the medical providers regarding overall plan of care and treatment options,  ensuring decisions are within the context of the patients values and GOCs.  Questions and concerns addressed     Total time spent on the unit was 25 minutes   PMT will continue to support holistically.  Greater than 50% of the time was spent in counseling and coordination of care  Wadie Lessen NP  Palliative Medicine Team Team Phone # [phone] Pager [phone]

## 2018-11-06 NOTE — Progress Notes (Signed)
PROGRESS NOTE  Misty Thomas WRU:[id number] DOB: February 11, 1966 DOA: 10/26/2018 PCP: Patient, No Pcp Per  Brief Narrative:  52 year old woman PMH HIV/AIDS, cervical cancer with metastatic disease, recent cystoscopy with stent placement for right hydronephrosis presented with chills, shortness of breath, blood in stools.  Admitted for perirectal and peri-sacral abscess.  Seen by general surgery, GYN oncology, oncology, palliative medicine, urology.  Initially conservative management was recommended although diverting colostomy was considered.  Subsequently creatinine worsened and CT scan revealed forniceal rupture and left urinoma with mild to moderate right hydronephrosis with stent in place.  She underwent bilateral percutaneous nephrostomy placement 12/29.  Renal function improving 12/30.  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  Complications include necrotic tumor, vaginal rectal fistula, possible infection/abscess, severe pain --General surgery s/o-final recommendation conservative management, no surgery. --Poor prognosis.  Palliative medicine did see earlier in hospitalization patient desire was for full care. --transition cefepime and metronidazole  to Augmentin 12/31, Dr. Sarajane Jews discussed with Dr. Remi Haggard till 1/7  Acute kidney injury, secondary to right hydronephrosis, hydroureter, left forniceal rupture and urinoma.  Right greater than left hydronephrosis with stent placement initially 12/18.  Bilateral nephrostomy tube placement 12/29. --Renal function improving status post nephrostomy tube placements.  Multifactorial anemia, including anemia of chronic kidney disease, malignancy, blood loss. --Has been stable.  Follow clinically.  Cancer associated pain --Continue present regimen  HIV/AIDS --Regimen put on hold by previous  attending physician for worsening renal function.  resuming  Diabetes mellitus type 2, diet-controlled --CBG remains stable.  Sugars in the 90-1 20 range  TSH elevated --Follow-up as an outpatient.   Renal function improving.  If continues to improve, likely will be able to leave the hospital in next 48 hours.  Will consult physical therapy.  DVT prophylaxis: Heparin Code Status: Full Family Communication: Discussed with the husband on phone 12/31 Disposition Plan: Pending   Verneita Griffes, MD [facility] 2:05 PM  11/06/2018, 1:57 PM  LOS: 11 days   Consultants:  General surgery  GYN oncology  PMT  Procedures:   US/fluoro guided L and R perc nephrostomy placement 60f  Antimicrobials:  Cefepime 12/20 >  Metronidazole 12/20 > 12/31  Interval history/Subjective:  Somnolent on arrival no new issues Seems to understand a little of what I am saying but given her somnolence I am not sure Eating and drinking some  Objective: Vitals:  Vitals:   11/06/18 0542 11/06/18 1355  BP: 126/90 126/85  Pulse: (!) 111 (!) 114  Resp: 18 (!) 24  Temp: 99.3 F (37.4 C) 99.5 F (37.5 C)  SpO2: 95% 98%    Exam:  Constitutional:   . Appears calm, uncomfortable, nontoxic ENMT:  . grossly normal hearing  Respiratory:  . CTA bilaterally, no w/r/r.  . Respiratory effort normal.  Cardiovascular:  . RRR, no m/r/g . No LE extremity edema   Abdomen:  . Soft, mild generalized tenderness Psychiatric:  . Mental status o Mood, affect appropriate  I have personally reviewed the following:   Data: . Creatinine much improved, 1.7 BUN trending down, 37 potassium within normal limits. . WBC up from 5-->12.5 hemoglobin 8.3 platelets 324  Scheduled Meds: . amoxicillin-clavulanate  1 tablet Oral Q12H  . bictegravir-emtricitabine-tenofovir AF  1 tablet Oral Daily  . feeding supplement  1 Container Oral TID BM  . folic acid  1 mg Oral Daily  . heparin injection  (subcutaneous)  5,000 Units Subcutaneous Q8H  . levothyroxine  25 mcg Oral Q0600  . magnesium oxide  400 mg Oral BID  . morphine  60 mg Oral Q12H  . polyethylene glycol  17 g Oral Daily  . psyllium  1 packet Oral Daily  . senna-docusate  1 tablet Oral BID  . sodium chloride flush  10-40 mL Intracatheter Q12H  . sodium chloride flush  5 mL Intracatheter Q8H   Continuous Infusions: . sodium chloride Stopped (11/05/18 2056)  . ceFEPime (MAXIPIME) IV 2 g (11/06/18 0911)  . 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

## 2018-11-06 NOTE — Progress Notes (Signed)
Referring Physician(s): Amin,A  Supervising Physician: Arne Cleveland  Patient Status:  [facility] - In-pt  Chief Complaint: Flank pain, bilateral hydronephrosis, cervical cancer   Subjective: Patient feeling about the same.  Continues to have some mild bilateral flank soreness, occasional nausea.   Allergies: Patient has no known allergies.  Medications: Prior to Admission medications   Medication Sig Start Date End Date Taking? Authorizing Provider  acetaminophen (TYLENOL) 500 MG tablet Take 1,000 mg by mouth every 6 (six) hours as needed for moderate pain or headache.    Yes [provider]  BIKTARVY 50-200-25 MG TABS tablet TAKE 1 TABLET BY MOUTH DAILY. 09/14/18  Yes Comer, Okey Regal, MD  dexamethasone (DECADRON) 4 MG tablet Take 5 tabs at the night before and 5 tab the morning of chemotherapy, every 3 weeks, by mouth 10/15/18  Yes Gorsuch, Ni, MD  levothyroxine (SYNTHROID, LEVOTHROID) 25 MCG tablet Take 1 tablet (25 mcg total) by mouth daily before breakfast. 02/08/18  Yes Alvy Bimler, Ni, MD  lidocaine-prilocaine (EMLA) cream Apply to affected area once 10/26/18  Yes Gorsuch, Ni, MD  magnesium oxide (MAG-OX) 400 (241.3 Mg) MG tablet Take 1 tablet (400 mg total) by mouth 2 (two) times daily. 03/08/18  Yes Gorsuch, Ni, MD  morphine (MS CONTIN) 60 MG 12 hr tablet Take 1 tablet (60 mg total) by mouth every 12 (twelve) hours. 10/15/18  Yes Gorsuch, Ni, MD  morphine (MSIR) 30 MG tablet Take 1 tablet (30 mg total) by mouth every 6 (six) hours as needed for severe pain. 10/15/18  Yes [name], MD  phenazopyridine (PYRIDIUM) 200 MG tablet Take 1 tablet (200 mg total) by mouth 3 (three) times daily as needed for pain. 10/24/18  Yes Ardis Hughs, MD  polyethylene glycol [facility] Floria Raveling) packet Take 17 g by mouth daily.   Yes [provider]  ondansetron (ZOFRAN) 8 MG tablet Take 1 tablet (8 mg total) by mouth 2 (two) times daily as needed for refractory nausea / vomiting.  Start on day 3 after chemo. 10/26/18   [name], MD  prochlorperazine (COMPAZINE) 10 MG tablet Take 1 tablet (10 mg total) by mouth every 6 (six) hours as needed (Nausea or vomiting). 10/26/18   [name], MD     Vital Signs: BP 126/90 (BP Location: Left Arm)   Pulse (!) 111   Temp 99.3 F (37.4 C) (Oral)   Resp 18   Ht 5\' 1"  (1.549 m)   Wt 189 lb 9.5 oz (86 kg)   LMP 02/03/2018   SpO2 95%   BMI 35.82 kg/m   Physical Exam bilateral nephrostomies intact, outputs right 300 cc, left 500 cc blood-tinged urine; drains flushed without difficulty  Imaging: Ir Nephrostomy Placement Left  Result Date: 11/04/2018 CLINICAL DATA:  Cervical carcinoma with low back pain, hydronephrosis, worsening renal function despite placement of right ureteral stent. CT also demonstrates left perinephric collection suggesting forniceal rupture. Percutaneous nephrostomy decompression requested. EXAM: BILATERAL PERCUTANEOUS NEPHROSTOMY CATHETER PLACEMENT UNDER ULTRASOUND AND FLUOROSCOPIC GUIDANCE FLUOROSCOPY TIME:  2.3 minute; 381 uGym2 DAP TECHNIQUE: The procedure, risks (including but not limited to bleeding, infection, organ damage ), benefits, and alternatives were explained to the patient. Questions regarding the procedure were encouraged and answered. The patient understands and consents to the procedure. Bilateralflank regions prepped with chlorhexidine, draped in usual sterile fashion, infiltrated locally with 1% lidocaine.No periprocedural antibiotics prophylaxis was indicated. Intravenous Fentanyl and Versed were administered as conscious sedation during continuous monitoring of the patient's level of  consciousness and physiological / cardiorespiratory status by the radiology RN, with a total moderate sedation time of 20 minutes. Under real-time ultrasound guidance, a 21-gauge trocar needle was advanced into a posterior left lower pole calyx. Ultrasound image documentation was saved. Guidewire advanced  easily into the renal collecting system and down the proximal ureter. Needle was exchanged over a guidewire for transitional dilator. Contrast injection confirmed appropriate positioning. Catheter was exchanged over a guidewire for a 10 French pigtail catheter, formed centrally within the left renal collecting system. Contrast injection confirms appropriate positioning and patency. In similar fashion, on the right, Under real-time ultrasound guidance, a 21-gauge trocar needle was advanced into a posterior lower pole calyx. Ultrasound image documentation was saved. Urinary returned spontaneously through the needle hub. Guidewire advanced easily into the renal collecting system . Needle was exchanged over a guidewire for transitional dilator. Contrast injection confirmed appropriate positioning. Catheter was exchanged over a guidewire for a 10 French pigtail catheter, formed centrally within the right renal collecting system. Contrast injection confirms appropriate positioning and patency. Catheters secured externally with 0 Prolene suture and StatLock, and placed to external drain bags. The patient tolerated the procedure well. COMPLICATIONS: COMPLICATIONS none IMPRESSION: 1. Technically successful bilateral percutaneous nephrostomy catheter placement. Electronically Signed   By: Lucrezia Europe M.D.   On: 11/04/2018 15:13   Ir Nephrostomy Placement Right  Result Date: 11/04/2018 CLINICAL DATA:  Cervical carcinoma with low back pain, hydronephrosis, worsening renal function despite placement of right ureteral stent. CT also demonstrates left perinephric collection suggesting forniceal rupture. Percutaneous nephrostomy decompression requested. EXAM: BILATERAL PERCUTANEOUS NEPHROSTOMY CATHETER PLACEMENT UNDER ULTRASOUND AND FLUOROSCOPIC GUIDANCE FLUOROSCOPY TIME:  2.3 minute; 381 uGym2 DAP TECHNIQUE: The procedure, risks (including but not limited to bleeding, infection, organ damage ), benefits, and alternatives were  explained to the patient. Questions regarding the procedure were encouraged and answered. The patient understands and consents to the procedure. Bilateralflank regions prepped with chlorhexidine, draped in usual sterile fashion, infiltrated locally with 1% lidocaine.No periprocedural antibiotics prophylaxis was indicated. Intravenous Fentanyl and Versed were administered as conscious sedation during continuous monitoring of the patient's level of consciousness and physiological / cardiorespiratory status by the radiology RN, with a total moderate sedation time of 20 minutes. Under real-time ultrasound guidance, a 21-gauge trocar needle was advanced into a posterior left lower pole calyx. Ultrasound image documentation was saved. Guidewire advanced easily into the renal collecting system and down the proximal ureter. Needle was exchanged over a guidewire for transitional dilator. Contrast injection confirmed appropriate positioning. Catheter was exchanged over a guidewire for a 10 French pigtail catheter, formed centrally within the left renal collecting system. Contrast injection confirms appropriate positioning and patency. In similar fashion, on the right, Under real-time ultrasound guidance, a 21-gauge trocar needle was advanced into a posterior lower pole calyx. Ultrasound image documentation was saved. Urinary returned spontaneously through the needle hub. Guidewire advanced easily into the renal collecting system . Needle was exchanged over a guidewire for transitional dilator. Contrast injection confirmed appropriate positioning. Catheter was exchanged over a guidewire for a 10 French pigtail catheter, formed centrally within the right renal collecting system. Contrast injection confirms appropriate positioning and patency. Catheters secured externally with 0 Prolene suture and StatLock, and placed to external drain bags. The patient tolerated the procedure well. COMPLICATIONS: COMPLICATIONS none IMPRESSION:  1. Technically successful bilateral percutaneous nephrostomy catheter placement. Electronically Signed   By: Lucrezia Europe M.D.   On: 11/04/2018 15:13    Labs:  CBC: Recent Labs  11/03/18 0910 11/04/18 0449 11/05/18 0345 11/06/18 0508  WBC 1.5* 2.6* 5.2 12.5*  HGB 7.8* 8.2* 7.8* 8.3*  HCT 25.3* 27.0* 25.0* 27.2*  PLT 240 303 294 324    COAGS: Recent Labs    02/01/18 0850 11/03/18 0425 11/04/18 0449  INR 1.03 1.46 1.42    BMP: Recent Labs    11/03/18 0425 11/04/18 0449 11/05/18 0900 11/06/18 0508  NA 132* 134* 136 137  K 4.8 4.8 4.2 4.2  CL 105 106 109 110  CO2 17* 16* 17* 16*  GLUCOSE 119* 107* 93 92  BUN 60* 58* 41* 34*  CALCIUM 8.5* 8.6* 8.6* 8.7*  CREATININE 3.74* 3.70* 2.00* 1.72*  GFRNONAA 13* 13* 28* 34*  GFRAA 15* 15* 32* 39*    LIVER FUNCTION TESTS: Recent Labs    10/15/18 1125 10/25/18 1159 10/26/18 1702 10/28/18 0420  BILITOT 0.4 0.3 0.6 0.8  AST 10* 15 16 16   ALT 6 8 9 11   ALKPHOS 84 92 77 55  PROT 8.9* 9.3* 8.3* 7.0  ALBUMIN 3.0* 3.0* 3.0* 2.8*    Assessment and Plan: Patient with history of HIV, stage IV metastatic cervical cancer with rectovaginal fistula, right ureteral stent placement with persistent hydronephrosis, left perinephric fluid collection with hydronephrosis, renal insufficiency; status post bilateral percutaneous nephrostomies on 12/29; afebrile, WBC 12.5, hemoglobin 8.3, creatinine 1.72 down from 2.0; continue current treatment; OOB.   Electronically Signed: D. Rowe Robert, PA-C 11/06/2018, 12:44 PM   I spent a total of 15 minutes at the the patient's bedside AND on the patient's hospital floor or unit, greater than 50% of which was counseling/coordinating care for bilateral percutaneous nephrostomies    Patient ID: [name], female   DOB: [birth year], 52 y.o.   MRN: [id number]

## 2018-11-06 NOTE — Progress Notes (Signed)
Pharmacy Antibiotic Note  Misty Thomas is a 52 y.o. female admitted on 10/26/2018 with perirectal and presacral abscess and possible UTI.  Pharmacy was consulted for Cefepime dosing.  11/06/2018   SCr improving. SCr = 1.72, CrCl ~ 38 mL/min  Biktarvy has been on hold for AKI and CrCl < 30 mL/min   WBC 12.5. Pt received Granix 12/28 & 12/29  Afebrile  Day #11 of IV antibiotics  Plan:  Given improvement in renal function, will increase cefepime to 2 g IV q12h  Since SCr > 30 mL/min, consider resuming Biktarvy if clinically appropriate  Metronidazole 500 mg IV q8h per MD  Continue to monitor renal function, cultures, and clinical course  Height: 5\' 1"  (154.9 cm) Weight: 189 lb 9.5 oz (86 kg) IBW/kg (Calculated) : 47.8  Temp (24hrs), Avg:99.3 F (37.4 C), Min:99 F (37.2 C), Max:99.6 F (37.6 C)  Recent Labs  Lab 11/02/18 0420 11/03/18 0425 11/03/18 0910 11/04/18 0449 11/05/18 0345 11/05/18 0900 11/06/18 0508  WBC  --  QUESTIONABLE RESULTS, RECOMMEND RECOLLECT TO VERIFY 1.5* 2.6* 5.2  --  12.5*  CREATININE 3.55* 3.74*  --  3.70*  --  2.00* 1.72*    Estimated Creatinine Clearance: 38.1 mL/min (A) (by C-G formula based on SCr of 1.72 mg/dL (H)).    No Known Allergies  Antimicrobials this admission: 12/21 Cefepime >> 12/21 Metronidazole >>   Microbiology results: 12/20 UCx: insignificant growth F 12/21 BCx: NGF  Thank you for allowing pharmacy to be a part of this patient's care.  Lenis Noon, PharmD 11/06/18 7:20 AM

## 2018-11-06 NOTE — Evaluation (Signed)
Physical Therapy Evaluation Patient Details Name: Misty Thomas MRN: [id number] DOB: 1966/10/04 Today's Date: 11/06/2018   History of Present Illness  52 year old woman PMH HIV/AIDS, cervical cancer with metastatic disease, recent cystoscopy with stent placement for right hydronephrosis presented with chills, shortness of breath, blood in stools.  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 Pt currently with functional limitations due to the deficits listed below (see PT Problem List).  Pt agreeable to mobilize as tolerated today however upon standing, pt realized she had had a BM and assisted to [facility].  Pt then assisted to recliner for safety.  Pt reports a couple falls during hospitalization due to LEs buckling (+2 for ambulation for safety).  Pt would benefit from SNF upon d/c however may have assist from family at home (however likely not 24/7 which is concern due to high fall risk). Pt will benefit from skilled PT to increase their independence and safety with mobility to allow discharge to the venue listed below.      Follow Up Recommendations SNF;Supervision for mobility/OOB    Equipment Recommendations  Rolling walker with 5" wheels    Recommendations for Other Services       Precautions / Restrictions Precautions Precautions: Fall Precaution Comments: bil PCNs, pt with fistula, incontinent of BM      Mobility  Bed Mobility Overal bed mobility: Needs Assistance Bed Mobility: Supine to Sit     Supine to sit: Min guard;HOB elevated     General bed mobility comments: min/guard for safety and lines  Transfers Overall transfer level: Needs assistance Equipment used: Rolling walker (2 wheeled) Transfers: Sit to/from Stand Sit to Stand: Min assist;From elevated surface         General transfer  comment: verbal cues for hand placement, assist to rise and steady, stand pivot from bed to [facility] then BSC to recliner  Ambulation/Gait             General Gait Details: deferred today due to fatigue and had to use BSC (BM)  Stairs            Wheelchair Mobility    Modified Rankin (Stroke Patients Only)       Balance Overall balance assessment: Needs assistance         Standing balance support: Bilateral upper extremity supported Standing balance-Leahy Scale: Poor Standing balance comment: requiring UE support at this time                             Pertinent Vitals/Pain Pain Assessment: No/denies pain(no specific c/o pain, RN reports she just received pain meds)    Home Living Family/patient expects to be discharged to:: Private residence Living Arrangements: Spouse/significant other Available Help at Discharge: Family;Available PRN/intermittently Type of Home: House Home Access: Stairs to enter   [employer] of Steps: 2 Home Layout: One level Home Equipment: None      Prior Function Level of Independence: Independent               Hand Dominance        Extremity/Trunk Assessment        Lower Extremity Assessment Lower Extremity Assessment: Generalized weakness       Communication   Communication: No difficulties  Cognition Arousal/Alertness: Awake/alert Behavior During Therapy: WFL for tasks assessed/performed Overall Cognitive Status: Within  Functional Limits for tasks assessed                                        General Comments      Exercises     Assessment/Plan    PT Assessment Patient needs continued PT services  PT Problem List Decreased strength;Decreased mobility;Decreased activity tolerance;Decreased balance;Decreased knowledge of use of DME       PT Treatment Interventions DME instruction;Functional mobility training;Gait training;Therapeutic activities;Balance  training;Stair training;Therapeutic exercise;Patient/family education    PT Goals (Current goals can be found in the Care Plan section)  Acute Rehab PT Goals PT Goal Formulation: With patient Time For Goal Achievement: 11/20/18 Potential to Achieve Goals: Good    Frequency Min 3X/week   Barriers to discharge        Co-evaluation               AM-PAC PT "6 Clicks" Mobility  Outcome Measure Help needed turning from your back to your side while in a flat bed without using bedrails?: A Little Help needed moving from lying on your back to sitting on the side of a flat bed without using bedrails?: A Little Help needed moving to and from a bed to a chair (including a wheelchair)?: A Little Help needed standing up from a chair using your arms (e.g., wheelchair or bedside chair)?: A Little Help needed to walk in hospital room?: A Lot Help needed climbing 3-5 steps with a railing? : A Lot 6 Click Score: 16    End of Session Equipment Utilized During Treatment: Gait belt Activity Tolerance: Patient tolerated treatment well Patient left: in chair;with call bell/phone within reach;with chair alarm set;with nursing/sitter in room Nurse Communication: Mobility status PT Visit Diagnosis: Difficulty in walking, not elsewhere classified (R26.2)    Time: 9381-0175 PT Time Calculation (min) (ACUTE ONLY): 34 min   Charges:   PT Evaluation $PT Eval Low Complexity: 1 Low PT Treatments $Therapeutic Activity: 8-22 mins        Carmelia Bake, PT, DPT Acute Rehabilitation Services Office: [phone] Pager: [phone]  Misty Thomas 11/06/2018, 2:41 PM

## 2018-11-06 NOTE — Progress Notes (Signed)
Misty Thomas   DOB:[birth year]   CW#:[id number]    Assessment & Plan:   Recurrent, locally advanced cervical cancer She has received first dose of chemotherapy on October 26, 2018. Continue supportive care It is not clear to me whether she can tolerate another cycle of chemotherapy next week.  We will discuss further about risk and benefits of continuing chemotherapy versus switching her to immunotherapy.  Necrotic tumor with vaginal/rectal fistula, probable infection/abscess It is hard to interpret her imaging study but the patient likely have necrotic tumor complicated by fistula. She has received broad-spectrum IV antibiotics I had extensive discussion with GYN oncologist and general surgeryon 12/23about the role of palliative, diversion colostomy. She would be at very high risk of complication with infection and probable wound healing given prior history of radiation, recent chemotherapy, background history of HIV infection and protein calorie malnutrition. For now, I favor conservative management with IV antibiotics and supportive care I would defer to her primary service to determine the duration of IV antibiotics that would be appropriate  HIV infection She will continue antiretroviral treatment  Cancer related pain Due to infection, disease and fistula.  She has additional pain from recent procedure She will continue to receive intermittent doses of pain medicine as needed  Altered mental status/confusion,somewhat sedated, resolved Suspectitcould be related to pain medicine. Recent CT imaging of the headshowedno evidence of intracranial metastasis  Severe constipation, resolved Multifactorial, could be due to disease and side effects of medication Recommendcontinue onaggressive laxative therapy  Leukopenia, resolved She has received G-CSF support over the weekend.  I will discontinue Granix  Acute onchronic renal failure, resolving Due to hydronephrosis  and disease,or possibility would include side effects of antibiotic treatment or recent CT scan with contrast She had stent placement recently. She had bilateral percutaneous nephrostomy tube placement.  If renal failure does not improve, please consult nephrology service for further management  Chronicanemia Due to recent renal disease and chemotherapy, with additional bleeding from recent surgery If hemoglobin start trending down, she will need blood transfusion.  Moderate protein calorie malnutrition Due to her disease Nutritional supplement as needed  Goals of care discussion She hadnumerous discussionswith surgicalteams In my opinion, I would favor conservative management right now rather than diversion colostomy due to high risk of complication. Appreciate palliative care consult for symptom management  Discharge planning Overall, she is improving.  I would defer to primary service in regards to discharge planning. She has appointment to see me next week on November 15, 2018.  I will sign off.  Please call if questions arise  [name], MD 11/06/2018  7:53 AM   Subjective:  She is alert.  Complaining of a lot of back pain from percutaneous tube placement.  She has some mild intermittent nausea.  Denies constipation.  Objective:  Vitals:   11/05/18 2046 11/06/18 0542  BP: (!) 141/76 126/90  Pulse: (!) 119 (!) 111  Resp: 20 18  Temp: 99.6 F (37.6 C) 99.3 F (37.4 C)  SpO2: 100% 95%     Intake/Output Summary (Last 24 hours) at 11/06/2018 0753 Last data filed at 11/06/2018 0545 Gross per 24 hour  Intake 1007.28 ml  Output 2450 ml  Net -1442.72 ml    GENERAL:alert, no distress and comfortable Musculoskeletal:no cyanosis of digits and no clubbing  NEURO: alert & oriented x 3 with fluent speech, no focal motor/sensory deficits   Labs:  Lab Results  Component Value Date   WBC 12.5 (H) 11/06/2018   HGB  8.3 (L) 11/06/2018   HCT 27.2 (L) 11/06/2018   MCV  95.1 11/06/2018   PLT 324 11/06/2018   NEUTROABS 9.7 (H) 11/06/2018    Lab Results  Component Value Date   NA 137 11/06/2018   K 4.2 11/06/2018   CL 110 11/06/2018   CO2 16 (L) 11/06/2018    Studies:  Ir Nephrostomy Placement Left  Result Date: 11/04/2018 CLINICAL DATA:  Cervical carcinoma with low back pain, hydronephrosis, worsening renal function despite placement of right ureteral stent. CT also demonstrates left perinephric collection suggesting forniceal rupture. Percutaneous nephrostomy decompression requested. EXAM: BILATERAL PERCUTANEOUS NEPHROSTOMY CATHETER PLACEMENT UNDER ULTRASOUND AND FLUOROSCOPIC GUIDANCE FLUOROSCOPY TIME:  2.3 minute; 381 uGym2 DAP TECHNIQUE: The procedure, risks (including but not limited to bleeding, infection, organ damage ), benefits, and alternatives were explained to the patient. Questions regarding the procedure were encouraged and answered. The patient understands and consents to the procedure. Bilateralflank regions prepped with chlorhexidine, draped in usual sterile fashion, infiltrated locally with 1% lidocaine.No periprocedural antibiotics prophylaxis was indicated. Intravenous Fentanyl and Versed were administered as conscious sedation during continuous monitoring of the patient's level of consciousness and physiological / cardiorespiratory status by the radiology RN, with a total moderate sedation time of 20 minutes. Under real-time ultrasound guidance, a 21-gauge trocar needle was advanced into a posterior left lower pole calyx. Ultrasound image documentation was saved. Guidewire advanced easily into the renal collecting system and down the proximal ureter. Needle was exchanged over a guidewire for transitional dilator. Contrast injection confirmed appropriate positioning. Catheter was exchanged over a guidewire for a 10 French pigtail catheter, formed centrally within the left renal collecting system. Contrast injection confirms appropriate positioning  and patency. In similar fashion, on the right, Under real-time ultrasound guidance, a 21-gauge trocar needle was advanced into a posterior lower pole calyx. Ultrasound image documentation was saved. Urinary returned spontaneously through the needle hub. Guidewire advanced easily into the renal collecting system . Needle was exchanged over a guidewire for transitional dilator. Contrast injection confirmed appropriate positioning. Catheter was exchanged over a guidewire for a 10 French pigtail catheter, formed centrally within the right renal collecting system. Contrast injection confirms appropriate positioning and patency. Catheters secured externally with 0 Prolene suture and StatLock, and placed to external drain bags. The patient tolerated the procedure well. COMPLICATIONS: COMPLICATIONS none IMPRESSION: 1. Technically successful bilateral percutaneous nephrostomy catheter placement. Electronically Signed   By: Lucrezia Europe M.D.   On: 11/04/2018 15:13   Ir Nephrostomy Placement Right  Result Date: 11/04/2018 CLINICAL DATA:  Cervical carcinoma with low back pain, hydronephrosis, worsening renal function despite placement of right ureteral stent. CT also demonstrates left perinephric collection suggesting forniceal rupture. Percutaneous nephrostomy decompression requested. EXAM: BILATERAL PERCUTANEOUS NEPHROSTOMY CATHETER PLACEMENT UNDER ULTRASOUND AND FLUOROSCOPIC GUIDANCE FLUOROSCOPY TIME:  2.3 minute; 381 uGym2 DAP TECHNIQUE: The procedure, risks (including but not limited to bleeding, infection, organ damage ), benefits, and alternatives were explained to the patient. Questions regarding the procedure were encouraged and answered. The patient understands and consents to the procedure. Bilateralflank regions prepped with chlorhexidine, draped in usual sterile fashion, infiltrated locally with 1% lidocaine.No periprocedural antibiotics prophylaxis was indicated. Intravenous Fentanyl and Versed were  administered as conscious sedation during continuous monitoring of the patient's level of consciousness and physiological / cardiorespiratory status by the radiology RN, with a total moderate sedation time of 20 minutes. Under real-time ultrasound guidance, a 21-gauge trocar needle was advanced into a posterior left lower pole calyx. Ultrasound image documentation was saved.  Guidewire advanced easily into the renal collecting system and down the proximal ureter. Needle was exchanged over a guidewire for transitional dilator. Contrast injection confirmed appropriate positioning. Catheter was exchanged over a guidewire for a 10 French pigtail catheter, formed centrally within the left renal collecting system. Contrast injection confirms appropriate positioning and patency. In similar fashion, on the right, Under real-time ultrasound guidance, a 21-gauge trocar needle was advanced into a posterior lower pole calyx. Ultrasound image documentation was saved. Urinary returned spontaneously through the needle hub. Guidewire advanced easily into the renal collecting system . Needle was exchanged over a guidewire for transitional dilator. Contrast injection confirmed appropriate positioning. Catheter was exchanged over a guidewire for a 10 French pigtail catheter, formed centrally within the right renal collecting system. Contrast injection confirms appropriate positioning and patency. Catheters secured externally with 0 Prolene suture and StatLock, and placed to external drain bags. The patient tolerated the procedure well. COMPLICATIONS: COMPLICATIONS none IMPRESSION: 1. Technically successful bilateral percutaneous nephrostomy catheter placement. Electronically Signed   By: Lucrezia Europe M.D.   On: 11/04/2018 15:13

## 2018-11-07 DIAGNOSIS — Z66 Do not resuscitate: Secondary | ICD-10-CM

## 2018-11-07 DIAGNOSIS — R531 Weakness: Secondary | ICD-10-CM

## 2018-11-07 MED ORDER — LORAZEPAM 0.5 MG PO TABS
0.5000 mg | ORAL_TABLET | Freq: Four times a day (QID) | ORAL | Status: DC | PRN
  Administered 2018-11-08: 0.5 mg via ORAL
  Filled 2018-11-07: qty 1

## 2018-11-07 MED ORDER — HYDROMORPHONE HCL 1 MG/ML IJ SOLN: 0.5000 mg | INTRAMUSCULAR | Status: DC | PRN

## 2018-11-07 NOTE — Plan of Care (Signed)
  Problem: Clinical Measurements: Goal: Ability to maintain clinical measurements within normal limits will improve Outcome: Progressing   Problem: Nutrition: Goal: Adequate nutrition will be maintained Outcome: Progressing   Problem: Coping: Goal: Level of anxiety will decrease Outcome: Progressing   

## 2018-11-07 NOTE — Progress Notes (Signed)
   11/07/18 1300  Clinical Encounter Type  Visited With Patient  Visit Type Follow-up  Referral From Palliative care team  Consult/Referral To Chaplain  The chaplain followed up with PMT request for Pt. spiritual care.  The Pt. declined the visit.  The chaplain is available for F/U spiritual care initiated by the Pt.

## 2018-11-07 NOTE — Progress Notes (Signed)
Patient ID: Misty Thomas, female   DOB: 15-Oct-1966, 53 y.o.   MRN: [id number]    Subjective: Pt with bilateral nephrostomy tubes in place.  Right ureteral stent had been placed initially with suboptimal drainage.  Objective: Vital signs in last 24 hours: Temp:  [99.2 F (37.3 C)-99.5 F (37.5 C)] 99.2 F (37.3 C) (01/01 0451) Pulse Rate:  [113-114] 114 (01/01 0451) Resp:  [16-24] 16 (01/01 0451) BP: (126-142)/(85-96) 137/92 (01/01 0451) SpO2:  [95 %-98 %] 97 % (01/01 0451) Weight:  [90 kg] 90 kg (12/31 1800)  Intake/Output from previous day: 12/31 0701 - 01/01 0700 In: 74 [P.O.:120; IV Piggyback:300] Out: 1102 [Urine:400; Drains:700; Stool:2] Intake/Output this shift: Total I/O In: -  Out: 1 [Stool:1]  Physical Exam:  General: Alert and oriented Abd: Bilateral nephrostomy tubes with mostly clear drainage  Lab Results: Recent Labs    11/05/18 0345 11/06/18 0508  HGB 7.8* 8.3*  HCT 25.0* 27.2*   BMET Recent Labs    11/05/18 0900 11/06/18 0508  NA 136 137  K 4.2 4.2  CL 109 110  CO2 17* 16*  GLUCOSE 93 92  BUN 41* 34*  CREATININE 2.00* 1.72*  CALCIUM 8.6* 8.7*     Studies/Results: No results found.  Assessment/Plan: Bilateral ureteral obstruction s/p bilateral nephrostomy tubes: Continue to monitor renal function.  Will eventually need outpatient follow up with primary urologist.   LOS: 12 days   [name],LES 11/07/2018, 9:05 AM

## 2018-11-07 NOTE — Progress Notes (Signed)
Patient ID: Misty Thomas, female   DOB: Sep 20, 1966, 53 y.o.   MRN: [id number]  This NP visited patient at the bedside as a follow up for palliative medicine needs and emotional support.    Patient is OOB and on commode, she is passing stool.   Assisted her back to bed and continued conversation regarding current medical situation, treatment option decisions, advanced directive decisions and anticipatory care needs.  Patient is hopeful for improvement and ultimately a transition home, she continues to be open to all offered and available medical interventions to prolong life.  We discussed the importance of managing her pain with oral agents only.  Today patient will utilize oral PRN morphine in anticipation home.  Will add low-dose anti-anxiety medication to augment analgesic.   Nursing to call if current medication regime is ineffective.  (Checked back with nursing at 15: 30 and patient is doing well on oral agents.)  We discussed anticipatory care needs at home.  Misty Thomas verbalizes concern regarding need for increased nursing care needs at home. I detailed eligibility and services of both community-based palliative and hospice services at home.  Patient may consider SNF for short-term rehab.  Discussed CODE STATUS again with Misty Thomas today and she verbalizes desire for DNR/DNI  Spoke to husband by phone and he support this decision.  Emotional support offered.  Patient very receptive to deep breathing and arm/hand massage during my visits.  Discussed with patient the importance of continued conversation with her  family and the medical providers regarding overall plan of care and treatment options,  ensuring decisions are within the context of the patients values and GOCs.  Questions and concerns addressed   Discussed with Dr Verlon Au and Bedside RN  Total time spent on the unit was 60  minutes   PMT will continue to support holistically.  Greater than 50% of the time was spent in counseling  and coordination of care  Wadie Lessen NP  Palliative Medicine Team Team Phone # [phone] Pager [phone]

## 2018-11-07 NOTE — Progress Notes (Signed)
PROGRESS NOTE  Misty Thomas FFM:[id number] DOB: 1966/09/30 DOA: 10/26/2018 PCP: Patient, No Pcp Per  Brief Narrative:  53 year old woman PMH HIV/AIDS, cervical cancer with metastatic disease, recent cystoscopy with stent placement for right hydronephrosis presented with chills, shortness of breath, blood in stools.  Admitted for perirectal and peri-sacral abscess.  Seen by general surgery, GYN oncology, oncology, palliative medicine, urology.  Initially conservative management was recommended although diverting colostomy was considered.  Subsequently creatinine worsened and CT scan revealed forniceal rupture and left urinoma with mild to moderate right hydronephrosis with stent in place.  She underwent bilateral percutaneous nephrostomy placement 12/29.  Renal function improving 12/30.  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  Complications include necrotic tumor, vaginal rectal fistula, possible infection/abscess, severe pain --General surgery s/o-final recommendation conservative management, no surgery. --Poor prognosis-now DNR per discussion with palliative care --transition cefepime and metronidazole  to Augmentin 12/31, Dr. Sarajane Jews discussed with Dr. Remi Haggard till 1/7   Acute kidney injury, secondary to right hydronephrosis, hydroureter, left forniceal rupture and urinoma.  Right greater than left hydronephrosis with stent placement initially 12/18.  Bilateral nephrostomy tube placement 12/29. --Renal function improving status post nephrostomy tube placements. -RPt labs in am  Multifactorial anemia, including anemia of chronic kidney disease, malignancy, blood loss. --Has been stable.  Follow clinically.  Cancer associated pain --Continue present regimen --Pain is mod controlled on oral meds.  Anticipate nearing discharge to SNF  soon  HIV/AIDS --Regimen put on hold by previous attending physician and has now been resumed  Diabetes mellitus type 2, diet-controlled --CBG remains stable.  Sugars in the 90 range  TSH elevated --Follow-up as an outpatient.   Renal function improving. Pain now more manageable-await CSW input for SNF in the next day or so  DVT prophylaxis: Heparin Code Status: Full Family Communication: Discussed with the husband on phone 11/07/18 Disposition Plan: Pending   Verneita Griffes, MD [facility] 4:49 PM  11/07/2018, 4:49 PM  LOS: 12 days   Consultants:  General surgery  GYN oncology  PMT  Procedures:   US/fluoro guided L and R perc nephrostomy placement 39f  Antimicrobials:  Cefepime 12/20 >12/31  Metronidazole 12/20 > 12/31  Augmentin  Interval history/Subjective:  Awake more coherent  Objective: Vitals:  Vitals:   11/07/18 0451 11/07/18 1440  BP: (!) 137/92 (!) 133/93  Pulse: (!) 114 (!) 121  Resp: 16 (!) 24  Temp: 99.2 F (37.3 C) 98.8 F (37.1 C)  SpO2: 97% 98%    Exam:  Constitutional:   . Appears calm, no distress ENMT:  . grossly normal hearing  Respiratory:  . CTA bilaterally, no w/r/r.  . Respiratory effort normal.  Cardiovascular:  . RRR, no m/r/g . No LE extremity edema   Abdomen:  . Soft, mild generalized tenderness . Nephrostomy tubes in place Psychiatric:  . Mental status o Mood, affect appropriate  I have personally reviewed the following:   Data: .   Scheduled Meds: . amoxicillin-clavulanate  1 tablet Oral Q12H  . bictegravir-emtricitabine-tenofovir AF  1 tablet Oral Daily  . feeding supplement  1 Container Oral TID BM  . folic acid  1 mg Oral Daily  . heparin injection (subcutaneous)  5,000 Units Subcutaneous Q8H  . levothyroxine  25 mcg Oral Q0600  . magnesium oxide  400 mg Oral BID  . morphine  60 mg Oral Q12H  . polyethylene glycol  17 g  Oral Daily  . psyllium  1 packet Oral Daily  . senna-docusate  1  tablet Oral BID  . sodium chloride flush  10-40 mL Intracatheter Q12H  . sodium chloride flush  5 mL Intracatheter Q8H   Continuous Infusions: . 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)   Perirectal abscess   Goals of care, counseling/discussion   Weakness generalized   DNR (do not resuscitate)   LOS: 12 days

## 2018-11-08 LAB — CBC WITH DIFFERENTIAL/PLATELET
Abs Immature Granulocytes: 2.39 10*3/uL — ABNORMAL HIGH (ref 0.00–0.07)
Basophils Absolute: 0.1 10*3/uL (ref 0.0–0.1)
Basophils Relative: 0 %
EOS ABS: 0.1 10*3/uL (ref 0.0–0.5)
Eosinophils Relative: 1 %
HCT: 25.5 % — ABNORMAL LOW (ref 36.0–46.0)
Hemoglobin: 7.9 g/dL — ABNORMAL LOW (ref 12.0–15.0)
Immature Granulocytes: 17 %
Lymphocytes Relative: 8 %
Lymphs Abs: 1.2 10*3/uL (ref 0.7–4.0)
MCH: 29.4 pg (ref 26.0–34.0)
MCHC: 31 g/dL (ref 30.0–36.0)
MCV: 94.8 fL (ref 80.0–100.0)
Monocytes Absolute: 1.1 10*3/uL — ABNORMAL HIGH (ref 0.1–1.0)
Monocytes Relative: 8 %
Neutro Abs: 9.2 10*3/uL — ABNORMAL HIGH (ref 1.7–7.7)
Neutrophils Relative %: 66 %
Platelets: 288 10*3/uL (ref 150–400)
RBC: 2.69 MIL/uL — ABNORMAL LOW (ref 3.87–5.11)
RDW: 15.9 % — AB (ref 11.5–15.5)
WBC: 13.9 10*3/uL — ABNORMAL HIGH (ref 4.0–10.5)
nRBC: 0.4 % — ABNORMAL HIGH (ref 0.0–0.2)

## 2018-11-08 LAB — RENAL FUNCTION PANEL
Albumin: 2.7 g/dL — ABNORMAL LOW (ref 3.5–5.0)
Anion gap: 11 (ref 5–15)
BUN: 21 mg/dL — ABNORMAL HIGH (ref 6–20)
CHLORIDE: 109 mmol/L (ref 98–111)
CO2: 18 mmol/L — ABNORMAL LOW (ref 22–32)
Calcium: 8.5 mg/dL — ABNORMAL LOW (ref 8.9–10.3)
Creatinine, Ser: 1.18 mg/dL — ABNORMAL HIGH (ref 0.44–1.00)
GFR calc Af Amer: >60 mL/min (ref >60)
GFR calc non Af Amer: 53 mL/min — ABNORMAL LOW (ref >60)
Glucose, Bld: 100 mg/dL — ABNORMAL HIGH (ref 70–99)
Phosphorus: 1.8 mg/dL — ABNORMAL LOW (ref 2.5–4.6)
Potassium: 3.6 mmol/L (ref 3.5–5.1)
Sodium: 138 mmol/L (ref 135–145)

## 2018-11-08 MED ORDER — HYDROMORPHONE HCL 1 MG/ML IJ SOLN
0.5000 mg | Freq: Four times a day (QID) | INTRAMUSCULAR | Status: DC | PRN
  Administered 2018-11-09 (×3): 0.5 mg via INTRAVENOUS
  Filled 2018-11-08 (×3): qty 0.5

## 2018-11-08 MED ORDER — POLYETHYLENE GLYCOL 3350 17 G PO PACK: 17.0000 g | PACK | Freq: Every day | ORAL | Status: DC | PRN

## 2018-11-08 NOTE — Plan of Care (Signed)
  Problem: Health Behavior/Discharge Planning: Goal: Ability to manage health-related needs will improve Outcome: Progressing   Problem: Clinical Measurements: Goal: Ability to maintain clinical measurements within normal limits will improve Outcome: Progressing Goal: Will remain free from infection Outcome: Progressing   

## 2018-11-08 NOTE — Progress Notes (Signed)
Physical Therapy Treatment Patient Details Name: Misty Thomas MRN: [id number] DOB: [birth year] Today's Date: 11/08/2018    History of Present Illness 53 year old woman PMH HIV/AIDS, cervical cancer with metastatic disease, recent cystoscopy with stent placement for right hydronephrosis presented with chills, shortness of breath, blood in stools.  pt admitted 10/26/18 for Locally advanced cervical cancer with rectovaginal fistula and pelvic/rectal pain, perirectal fluid collection abscess versus necrotic tumor    PT Comments    Pt agreeable to mobilize today and reports feeling better.  Pt first assisted to [facility] and then ambulated in hallway.  Pt requires min assist for support and occasional LE buckling.  Pt also needed seated rest breaks so recliner followed (also for safety).  Continue to recommend SNF upon d/c.   Follow Up Recommendations  SNF;Supervision for mobility/OOB     Equipment Recommendations  Rolling walker with 5" wheels    Recommendations for Other Services       Precautions / Restrictions Precautions Precautions: Fall Precaution Comments: bil PCNs, pt with fistula, incontinent of BM    Mobility  Bed Mobility Overal bed mobility: Needs Assistance Bed Mobility: Supine to Sit     Supine to sit: Min guard;HOB elevated     General bed mobility comments: min/guard for safety and lines  Transfers Overall transfer level: Needs assistance Equipment used: Rolling walker (2 wheeled) Transfers: Sit to/from Stand Sit to Stand: Min guard         General transfer comment: verbal cues for hand placement, min/guard for safety  Ambulation/Gait Ambulation/Gait assistance: Min assist;+2 safety/equipment Gait Distance (Feet): 60 Feet Assistive device: Rolling walker (2 wheeled) Gait Pattern/deviations: Step-through pattern;Trunk flexed;Decreased stride length Gait velocity: decr   General Gait Details: verbal cues for safe use of RW; pt required seated rest breaks  due to fatigue and near end of ambulation a few instances of LEs buckling; 20'x1, 40'x2, and 60'x1   Stairs             Wheelchair Mobility    Modified Rankin (Stroke Patients Only)       Balance Overall balance assessment: Needs assistance         Standing balance support: Bilateral upper extremity supported Standing balance-Leahy Scale: Poor Standing balance comment: requiring UE support at this time                            Cognition Arousal/Alertness: Awake/alert Behavior During Therapy: WFL for tasks assessed/performed Overall Cognitive Status: Within Functional Limits for tasks assessed                                        Exercises      General Comments        Pertinent Vitals/Pain Pain Assessment: No/denies pain    Home Living                      Prior Function            PT Goals (current goals can now be found in the care plan section) Progress towards PT goals: Progressing toward goals    Frequency    Min 3X/week      PT Plan Current plan remains appropriate    Co-evaluation              AM-PAC PT "6 Clicks" Mobility   Outcome  Measure  Help needed turning from your back to your side while in a flat bed without using bedrails?: A Little Help needed moving from lying on your back to sitting on the side of a flat bed without using bedrails?: A Little Help needed moving to and from a bed to a chair (including a wheelchair)?: A Little Help needed standing up from a chair using your arms (e.g., wheelchair or bedside chair)?: A Little Help needed to walk in hospital room?: A Little Help needed climbing 3-5 steps with a railing? : A Lot 6 Click Score: 17    End of Session Equipment Utilized During Treatment: Gait belt Activity Tolerance: Patient tolerated treatment well Patient left: in chair;with call bell/phone within reach;with nursing/sitter in room Nurse Communication: Mobility  status(NT aware pt up in recliner) PT Visit Diagnosis: Difficulty in walking, not elsewhere classified (R26.2)     Time: 3300-7622 PT Time Calculation (min) (ACUTE ONLY): 29 min  Charges:  $Gait Training: 23-37 mins                    Carmelia Bake, PT, DPT Acute Rehabilitation Services Office: [phone] Pager: [place] E 11/08/2018, 3:56 PM

## 2018-11-08 NOTE — Progress Notes (Signed)
PROGRESS NOTE  Misty Thomas UUV:[id number] DOB: 24-Jul-1966 DOA: 10/26/2018 PCP: Patient, No Pcp Per  Brief Narrative:  53 year old woman PMH HIV/AIDS, cervical cancer with metastatic disease, recent cystoscopy with stent placement for right hydronephrosis presented with chills, shortness of breath, blood in stools.  Admitted for perirectal and peri-sacral abscess.  Seen by general surgery, GYN oncology, oncology, palliative medicine, urology.  Initially conservative management was recommended although diverting colostomy was considered.  Subsequently creatinine worsened and CT scan revealed forniceal rupture and left urinoma with mild to moderate right hydronephrosis with stent in place.  She underwent bilateral percutaneous nephrostomy placement 12/29.  Renal function improving 12/30.  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  Complications include necrotic tumor, vaginal rectal fistula, possible infection/abscess, severe pain --General surgery s/o-final recommendation conservative management, no surgery. --Poor prognosis-now DNR per discussion with palliative care --transition cefepime and metronidazole  to Augmentin 12/31, Dr. Sarajane Jews discussed with Dr. Remi Haggard till 1/7 -No new changes overnight  Acute kidney injury, secondary to right hydronephrosis, hydroureter, left forniceal rupture and urinoma.  Right greater than left hydronephrosis with stent placement initially 12/18.  Bilateral nephrostomy tube placement 12/29. --Renal function improving as below --Further care nephrostomies as per urologist  Multifactorial anemia, including anemia of chronic kidney disease, malignancy, blood loss. --Has been stable.  Follow clinically.  Cancer associated pain --Pain controlled currently now on MS Contin 60 every 12, MSIR 30 every 4 and as  needed Dilaudid scaled back to every 6 as needed  HIV/AIDS --Regimen put on hold by previous attending physician and has now been resumed  Diabetes mellitus type 2, diet-controlled --CBG remains stable.  Sugars in the 90 range  TSH elevated --Follow-up as an outpatient.   Renal function improving. Pain manageable awaiting social worker input for skilled placement  DVT prophylaxis: Heparin Code Status: Full Family Communication: Discussed with the husband on phone 11/07/18 Disposition Plan: Pending   Verneita Griffes, MD [facility] 12:32 PM  11/08/2018, 12:32 PM  LOS: 13 days   Consultants:  General surgery  GYN oncology  PMT  Procedures:   US/fluoro guided L and R perc nephrostomy placement 31f  Antimicrobials:  Cefepime 12/20 >12/31  Metronidazole 12/20 > 12/31  Augmentin  Interval history/Subjective:  Multiple diarrhea stools but on 4 different agents for constipation to which have been discontinued-I have informed nursing to only give MiraLAX at this time we can monitor and see how she does-no fever-stool characteristics are soft it is not watery bloody  Objective: Vitals:  Vitals:   11/07/18 2105 11/08/18 0500  BP: 131/86 119/77  Pulse: (!) 115 (!) 128  Resp: 18 20  Temp: 99.5 F (37.5 C) 98.9 F (37.2 C)  SpO2: 99% 96%    Exam:  Constitutional:   . Alert pleasant no distress ENMT:  . grossly normal hearing  Respiratory:  . CTA bilaterally, no w/r/r.  . Respiratory effort normal.  Cardiovascular:  . RRR, no m/r/g . No LE extremity edema   Abdomen:  . Soft, mild generalized tenderness . Nephrostomy tubes in place Psychiatric:  . Mental status o Mood, affect appropriate  I have personally reviewed the following:   Data: . Bun/creat-->34/1.7--->21/1.18 . Wbc and hb about the same  Scheduled Meds: . amoxicillin-clavulanate  1 tablet Oral Q12H  . bictegravir-emtricitabine-tenofovir AF  1 tablet Oral Daily  . feeding supplement  1  Container Oral TID BM  .  folic acid  1 mg Oral Daily  . heparin injection (subcutaneous)  5,000 Units Subcutaneous Q8H  . levothyroxine  25 mcg Oral Q0600  . magnesium oxide  400 mg Oral BID  . morphine  60 mg Oral Q12H  . senna-docusate  1 tablet Oral BID  . sodium chloride flush  10-40 mL Intracatheter Q12H  . sodium chloride flush  5 mL Intracatheter Q8H   Continuous Infusions: . 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

## 2018-11-08 NOTE — Progress Notes (Signed)
Referring Physician(s): Amin,A  Supervising Physician: Markus Daft  Patient Status:  [facility] - In-pt  Chief Complaint: Flank pain, bilateral hydronephrosis, cervical cancer   Subjective: Patient doing fairly well; still has some flank discomfort but not worsening; currently denies nausea or vomiting   Allergies: Patient has no known allergies.  Medications: Prior to Admission medications   Medication Sig Start Date End Date Taking? Authorizing Provider  acetaminophen (TYLENOL) 500 MG tablet Take 1,000 mg by mouth every 6 (six) hours as needed for moderate pain or headache.    Yes [provider]  BIKTARVY 50-200-25 MG TABS tablet TAKE 1 TABLET BY MOUTH DAILY. 09/14/18  Yes Comer, Okey Regal, MD  dexamethasone (DECADRON) 4 MG tablet Take 5 tabs at the night before and 5 tab the morning of chemotherapy, every 3 weeks, by mouth 10/15/18  Yes Gorsuch, Ni, MD  levothyroxine (SYNTHROID, LEVOTHROID) 25 MCG tablet Take 1 tablet (25 mcg total) by mouth daily before breakfast. 02/08/18  Yes Alvy Bimler, Ni, MD  lidocaine-prilocaine (EMLA) cream Apply to affected area once 10/26/18  Yes Gorsuch, Ni, MD  magnesium oxide (MAG-OX) 400 (241.3 Mg) MG tablet Take 1 tablet (400 mg total) by mouth 2 (two) times daily. 03/08/18  Yes Gorsuch, Ni, MD  morphine (MS CONTIN) 60 MG 12 hr tablet Take 1 tablet (60 mg total) by mouth every 12 (twelve) hours. 10/15/18  Yes Gorsuch, Ni, MD  morphine (MSIR) 30 MG tablet Take 1 tablet (30 mg total) by mouth every 6 (six) hours as needed for severe pain. 10/15/18  Yes [name], MD  phenazopyridine (PYRIDIUM) 200 MG tablet Take 1 tablet (200 mg total) by mouth 3 (three) times daily as needed for pain. 10/24/18  Yes Ardis Hughs, MD  polyethylene glycol [facility] / Floria Raveling) packet Take 17 g by mouth daily.   Yes [provider]  ondansetron (ZOFRAN) 8 MG tablet Take 1 tablet (8 mg total) by mouth 2 (two) times daily as needed for refractory nausea /  vomiting. Start on day 3 after chemo. 10/26/18   [name], MD  prochlorperazine (COMPAZINE) 10 MG tablet Take 1 tablet (10 mg total) by mouth every 6 (six) hours as needed (Nausea or vomiting). 10/26/18   [name], MD     Vital Signs: BP 119/77 (BP Location: Left Arm)   Pulse (!) 128   Temp 98.9 F (37.2 C) (Oral)   Resp 20   Ht 5\' 1"  (1.549 m)   Wt 198 lb 6.6 oz (90 kg)   LMP 02/03/2018   SpO2 96%   BMI 37.49 kg/m   Physical Exam awake/alert;  bilateral PCNs intact, suction sites okay, mildly tender, output on left 400 cc, right 250 cc blood-tinged urine  Imaging: No results found.  Labs:  CBC: Recent Labs    11/04/18 0449 11/05/18 0345 11/06/18 0508 11/08/18 0348  WBC 2.6* 5.2 12.5* 13.9*  HGB 8.2* 7.8* 8.3* 7.9*  HCT 27.0* 25.0* 27.2* 25.5*  PLT 303 294 324 288    COAGS: Recent Labs    02/01/18 0850 11/03/18 0425 11/04/18 0449  INR 1.03 1.46 1.42    BMP: Recent Labs    11/04/18 0449 11/05/18 0900 11/06/18 0508 11/08/18 0348  NA 134* 136 137 138  K 4.8 4.2 4.2 3.6  CL 106 109 110 109  CO2 16* 17* 16* 18*  GLUCOSE 107* 93 92 100*  BUN 58* 41* 34* 21*  CALCIUM 8.6* 8.6* 8.7* 8.5*  CREATININE 3.70* 2.00* 1.72* 1.18*  GFRNONAA 13* 28* 34* 53*  GFRAA 15* 32* 39* >60    LIVER FUNCTION TESTS: Recent Labs    10/15/18 1125 10/25/18 1159 10/26/18 1702 10/28/18 0420 11/08/18 0348  BILITOT 0.4 0.3 0.6 0.8  --   AST 10* 15 16 16   --   ALT 6 8 9 11   --   ALKPHOS 84 92 77 55  --   PROT 8.9* 9.3* 8.3* 7.0  --   ALBUMIN 3.0* 3.0* 3.0* 2.8* 2.7*    Assessment and Plan: Patient with history of HIV, stage IV metastatic cervical cancer with rectovaginal fistula, right ureteral stent placement with persistent hydronephrosis,left perinephric fluid collection with hydronephrosis, renal insufficiency; statuspost bilateral percutaneous nephrostomies on 12/29;afebrile, WBC 13.9 (12.5), hgb 7.9(8.3), creat 1.18(1.72);PCN urine cx's neg to date; blood  cx neg; plans as per urology   Electronically Signed: D. Rowe Robert, PA-C 11/08/2018, 3:08 PM   I spent a total of 15 minutes at the the patient's bedside AND on the patient's hospital floor or unit, greater than 50% of which was counseling/coordinating care for bilateral nephrostomies    Patient ID: Misty Thomas, female   DOB: 25-Mar-1966, 53 y.o.   MRN: [id number]

## 2018-11-08 NOTE — Progress Notes (Signed)
Patient ID: Misty Thomas, female   DOB: [birth year], 53 y.o.   MRN: [id number]  This NP visited patient at the bedside as a follow up for palliative medicine needs and emotional support.    Patient is alert and orietned and "feels pretty good today".  Pain is controled with oral agents only.   Patient is hopeful for improvement, she continues to be open to all offered and available medical interventions to prolong life.  She is hopeful for further chemotherapy treatment as eligible.  We discussed anticipatory care needs at home.    Patient is open to  SNF for short-term rehab.  Emotional support offered.   Discussed with patient the importance of continued conversation with her  family and the medical providers regarding overall plan of care and treatment options,  ensuring decisions are within the context of the patients values and GOCs.  Questions and concerns addressed   Discussed with Dr Verlon Au and Bedside RN.. I let both patient and Dr. Verlon Au know that this nurse practitioner would not be back in the hospital again until Monday and to call the team phone with any questions or concerns.  I will update Dr. Micheline Rough regarding Misty Thomas  Total time spent on the unit was 15  minutes   PMT will continue to support holistically.  Greater than 50% of the time was spent in counseling and coordination of care  Wadie Lessen NP  Palliative Medicine Team Team Phone # [phone] Pager [phone]

## 2018-11-09 MED ORDER — AMOXICILLIN-POT CLAVULANATE 875-125 MG PO TABS: 1.0000 | ORAL_TABLET | Freq: Two times a day (BID) | ORAL | 0 refills | Status: DC

## 2018-11-09 MED ORDER — HEPARIN SOD (PORK) LOCK FLUSH 100 UNIT/ML IV SOLN
500.0000 [IU] | INTRAVENOUS | Status: DC
  Filled 2018-11-09: qty 5

## 2018-11-09 MED ORDER — LORAZEPAM 0.5 MG PO TABS: 0.5000 mg | ORAL_TABLET | Freq: Four times a day (QID) | ORAL | 0 refills | Status: DC | PRN

## 2018-11-09 MED ORDER — HEPARIN SOD (PORK) LOCK FLUSH 100 UNIT/ML IV SOLN
500.0000 [IU] | INTRAVENOUS | Status: DC | PRN
  Administered 2018-11-09: 500 [IU]
  Filled 2018-11-09 (×2): qty 5

## 2018-11-09 MED FILL — AMOX-CLAV 875-125 MG TABLET: 875-125 | 4 days supply | Qty: 8 | Fill #0

## 2018-11-09 NOTE — Care Management Note (Signed)
Case Management Note  Patient Details  Name: Misty Thomas MRN: [id number] Date of Birth: [birth year]  Subjective/Objective:Patient recc for HHRN-nephrostomy tube instruction,HHPT-patient chose [facility] rep Santiago Glad aware-Informed [facility] rep that medicaid eligibility worker assisting patient w/application. Patient also has [employer]. Patient has no pcp-provided w/pcp listing w/[facility], & [facility]-patient will make own appt.                     Action/Plan:dc home w/HHC.   Expected Discharge Date:  11/09/18               Expected Discharge Plan:  [place]  In-House Referral:     Discharge planning Services  CM Consult, [facility]  Post Acute Care Choice:    Choice offered to:  Patient  DME Arranged:    DME Agency:     HH Arranged:  RN, PT [place] Agency:  [place]  Status of Service:  Completed, signed off  If discussed at [place] of Stay Meetings, dates discussed:    Additional Comments:  Dessa Phi, RN 11/09/2018, 4:07 PM

## 2018-11-09 NOTE — Progress Notes (Signed)
PROGRESS NOTE  ILINE BUCHINGER INO:[id number] DOB: December 02, 1965 DOA: 10/26/2018 PCP: Patient, No Pcp Per  Brief Narrative:  53 year old woman PMH HIV/AIDS, cervical cancer with metastatic disease, recent cystoscopy with stent placement for right hydronephrosis presented with chills, shortness of breath, blood in stools.  Admitted for perirectal and peri-sacral abscess.  Seen by general surgery, GYN oncology, oncology, palliative medicine, urology.  Initially conservative management was recommended although diverting colostomy was considered.  Subsequently creatinine worsened and CT scan revealed forniceal rupture and left urinoma with mild to moderate right hydronephrosis with stent in place.  She underwent bilateral percutaneous nephrostomy placement 12/29.  Renal function improving 12/30.  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  Complications include necrotic tumor, vaginal rectal fistula, possible infection/abscess, severe pain --General surgery s/o-final recommendation conservative management, no surgery. --Poor prognosis-now DNR per discussion with palliative care --transition cefepime and metronidazole  to Augmentin 12/31, Dr. Sarajane Jews discussed with Dr. Remi Haggard till 1/7 -No new changes overnight -Seems to have placement issues and cannot get to skilled facility because of insurance issues see below  Acute kidney injury, secondary to right hydronephrosis, hydroureter, left forniceal rupture and urinoma.  Right greater than left hydronephrosis with stent placement initially 12/18.  Bilateral nephrostomy tube placement 12/29. --Renal function improving as below --Further care nephrostomies as per urologist  Multifactorial anemia, including anemia of chronic kidney disease, malignancy, blood loss. --Has been stable.  Follow  clinically.  Cancer associated pain --Pain controlled currently now on MS Contin 60 every 12, MSIR 30 every 4 and as needed Dilaudid scaled back to every 6 as needed-has not needed Dilaudid over the past day or so so likely will be able to discharge on oral meds  HIV/AIDS --Regimen put on hold by previous attending physician and has now been resumed  Diabetes mellitus type 2, diet-controlled --CBG remains stable.  CBG 80s to 100  TSH elevated --Follow-up as an outpatient.   Renal function improving. Pain manageable awaiting social worker input for skilled placement  DVT prophylaxis: Heparin Code Status: Full Family Communication: No family Disposition Plan: Pending awaiting discussion regarding disposition and placement may need to go home as there is no clear source for the patient and it seems she is too high level of functioning to go to skilled facility   Verneita Griffes, MD [facility] 2:09 PM  11/09/2018, 2:09 PM  LOS: 14 days   Consultants:  General surgery  GYN oncology  PMT  Procedures:   US/fluoro guided L and R perc nephrostomy placement 18f  Antimicrobials:  Cefepime 12/20 >12/31  Metronidazole 12/20 > 12/31  Augmentin  Interval history/Subjective:   Objective: Vitals:  Vitals:   11/09/18 0619 11/09/18 1302  BP: 128/84 124/83  Pulse: (!) 113 99  Resp: 18 16  Temp: 99.2 F (37.3 C) 99.4 F (37.4 C)  SpO2: 100% 98%    Exam:  Constitutional:   . Alert pleasant no distress ENMT:  . grossly normal hearing  Respiratory:  . CTA bilaterally, no w/r/r.  . Respiratory effort normal.  Cardiovascular:  . RRR, no m/r/g . No LE extremity edema   Abdomen:  . Soft, mild generalized tenderness . Nephrostomy tubes in place Psychiatric:  . Mental status o Mood, affect appropriate  I have personally reviewed the following:   Data: . Bun/creat-->34/1.7--->21/1.18 . Wbc and hb about the same  Scheduled Meds: . amoxicillin-clavulanate  1  tablet Oral  Q12H  . bictegravir-emtricitabine-tenofovir AF  1 tablet Oral Daily  . feeding supplement  1 Container Oral TID BM  . folic acid  1 mg Oral Daily  . heparin injection (subcutaneous)  5,000 Units Subcutaneous Q8H  . levothyroxine  25 mcg Oral Q0600  . magnesium oxide  400 mg Oral BID  . morphine  60 mg Oral Q12H  . senna-docusate  1 tablet Oral BID  . sodium chloride flush  10-40 mL Intracatheter Q12H  . sodium chloride flush  5 mL Intracatheter Q8H   Continuous Infusions: . 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

## 2018-11-09 NOTE — Progress Notes (Signed)
CSW spoke with patient via bedside regarding disposition planning. Patient voiced feeling better today and states she was able to walk further than she could yesterday. CSW questioned patients goals once leaving the hospital- patient stated she wants to return home and feels that she is able to care for herself. CSW questioned if patient would be agreeable to [facility]- patient stated " yes that would be nice". Patient did request assistance with new nephrostomy. CSW updated MD of conversation and need for assistance with nephrostomy.    Plan: patient to go home with [facility] once stable for discharge.    Kingsley Spittle, [place]  [phone]

## 2018-11-09 NOTE — Progress Notes (Signed)
Physical Therapy Treatment Patient Details Name: Misty Thomas MRN: [id number] DOB: [birth year] Today's Date: 11/09/2018    History of Present Illness 53 year old woman PMH HIV/AIDS, cervical cancer with metastatic disease, recent cystoscopy with stent placement for right hydronephrosis presented with chills, shortness of breath, blood in stools.  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  Pt able to tolerate improved distance without any seated rest breaks.  Pt also able to use BSC and perform all pericare with setup, also stand at sink and wash hands and face.  Pt feels capable of d/c home now and agreeable for HHPT.  Recommendations updated.    Follow Up Recommendations  Home health PT     Equipment Recommendations  Rolling walker with 5" wheels    Recommendations for Other Services       Precautions / Restrictions Precautions Precautions: Fall Precaution Comments: bil PCNs, pt with fistula, incontinent of BM    Mobility  Bed Mobility Overal bed mobility: Needs Assistance Bed Mobility: Supine to Sit     Supine to sit: Supervision     General bed mobility comments: safety and lines  Transfers Overall transfer level: Needs assistance Equipment used: Rolling walker (2 wheeled) Transfers: Sit to/from Stand Sit to Stand: Min guard;Supervision            Ambulation/Gait Ambulation/Gait assistance: Min Gaffer (Feet): 120 Feet Assistive device: Rolling walker (2 wheeled) Gait Pattern/deviations: Step-through pattern;Decreased stride length     General Gait Details: improved pace today, no buckling observed, pt ambulated 120'x1 and then return to room to use BSC then another 160'x1 in hallway   Stairs             Wheelchair Mobility     Modified Rankin (Stroke Patients Only)       Balance                                            Cognition Arousal/Alertness: Awake/alert Behavior During Therapy: WFL for tasks assessed/performed Overall Cognitive Status: Within Functional Limits for tasks assessed                                        Exercises      General Comments        Pertinent Vitals/Pain Pain Assessment: No/denies pain    Home Living                      Prior Function            PT Goals (current goals can now be found in the care plan section) Progress towards PT goals: Progressing toward goals    Frequency    Min 3X/week      PT Plan Discharge plan needs to be updated    Co-evaluation              AM-PAC PT "6 Clicks" Mobility   Outcome Measure  Help needed turning from your back to your side while in a flat bed without using bedrails?: A Little Help needed moving from lying on your back to sitting on the side  of a flat bed without using bedrails?: A Little Help needed moving to and from a bed to a chair (including a wheelchair)?: A Little Help needed standing up from a chair using your arms (e.g., wheelchair or bedside chair)?: A Little Help needed to walk in hospital room?: A Little Help needed climbing 3-5 steps with a railing? : A Little 6 Click Score: 18    End of Session Equipment Utilized During Treatment: Gait belt Activity Tolerance: Patient tolerated treatment well Patient left: in bed;with call bell/phone within reach;with bed alarm set   PT Visit Diagnosis: Difficulty in walking, not elsewhere classified (R26.2)     Time: 7618-4859 PT Time Calculation (min) (ACUTE ONLY): 32 min  Charges:  $Gait Training: 23-37 mins                    Carmelia Bake, PT, DPT Acute Rehabilitation Services Office: [phone] Pager: [place] E 11/09/2018, 4:44 PM

## 2018-11-09 NOTE — Discharge Summary (Signed)
Physician Discharge Summary  Misty Thomas:[id number] DOB: May 25, 1966 DOA: 10/26/2018  PCP: Patient, No Pcp Per  Admit date: 10/26/2018 Discharge date: 11/09/2018  Time spent: 35 minutes  Recommendations for Outpatient Follow-up:  1. Needs OP cbc and cmet in about 1 week 2. Needs OP goals of care 3. Last date Abx 11/13/18 4. recommend follow up Dr. Alvy Bimler ocology and Dr. 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   Subclinical hypothyroidism   Port-A-Cath in place   History of external beam radiation therapy   Palliative care by specialist   AKI (acute kidney injury) ([place])   Perirectal abscess   Goals of care, counseling/discussion   Weakness generalized   DNR (do not resuscitate)   Discharge Condition: improved-overall is gaurded  Diet recommendation: renal  Filed Weights   10/26/18 2350 11/06/18 1800  Weight: 86 kg 90 kg    History of present illness:  53 year old woman PMH HIV/AIDS, cervical cancer with metastatic disease, recent cystoscopy with stent placement for right hydronephrosis presented with chills, shortness of breath, blood in stools.  Admitted for perirectal and peri-sacral abscess.  Seen by general surgery, GYN oncology, oncology, palliative medicine, urology.  Initially conservative management was recommended although diverting colostomy was considered.  Subsequently creatinine worsened and CT scan revealed forniceal rupture and left urinoma with mild to moderate right hydronephrosis with stent in place.  She underwent  bilateral percutaneous nephrostomy placement 12/29.  Renal function improving 12/30.  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  Complications include necrotic tumor, vaginal rectal fistula, possible infection/abscess, severe pain --General surgery s/o-final recommendation conservative management, no surgery. --Poor prognosis-now DNR per discussion with palliative care --transition cefepime and metronidazole  to Augmentin 12/31, Dr. Sarajane Jews discussed with Dr. Remi Haggard till 1/7 -No new changes overnight -Seems to have placement issues and decision mad eto d/c patient home with home health on d/c   Acute kidney injury, secondary to right hydronephrosis, hydroureter, left forniceal rupture and urinoma.  Right greater than left hydronephrosis with stent placement initially 12/18.  Bilateral nephrostomy tube placement 12/29. --Renal function improving as below --Further care nephrostomies as per urologist  Multifactorial anemia, including anemia of chronic kidney disease, malignancy, blood loss. --Has been stable.  Follow clinically.  Cancer associated pain --Pain controlled currently now on MS Contin 60 every 12, MSIR 30 every 4 and as needed Dilaudid scaled back to every 6 as needed--Given script also on d/c for ativan  HIV/AIDS --Regimen put on hold by previous attending physician and has now been resumed  Diabetes mellitus type 2, diet-controlled --CBG remains stable.  CBG 80s to 100  TSH elevated --Follow-up as an outpatient.  Consultants:  General surgery  GYN oncology  PMT   Procedures:   US/fluoro guided L and R perc nephrostomy placement 35f   Antimicrobials:  Cefepime 12/20 >12/31  Metronidazole 12/20 > 12/31  Augmentin till 1/7  Discharge Exam: Vitals:   11/09/18 0619 11/09/18 1302  BP: 128/84 124/83  Pulse: (!) 113 99  Resp: 18 16  Temp: 99.2 F (37.3 C) 99.4 F (37.4 C)  SpO2: 100% 98%    General: eomi ncat no distress Cardiovascular:  s1 s2 no m/r/g-clear no added sound Respiratory: clear no added sound abd soft nt nd no rebound Nephrosotmy tubes in place No le dema neuro intact  Discharge Instructions   Discharge Instructions    Diet - low sodium heart healthy   Complete by:  As directed    Discharge instructions   Complete by:  As directed    Continue antibiotics until all done-follow as an outpatient with dr. Alvy Bimler We will ask home health to come out and help you learn about the tubes in your kidneys Please get labs in about 1 week   Increase activity slowly   Complete by:  As directed      Allergies as of 11/09/2018   No Known Allergies     Medication List    STOP taking these medications   acetaminophen 500 MG tablet Commonly known as:  TYLENOL   dexamethasone 4 MG tablet Commonly known as:  DECADRON   lidocaine-prilocaine cream Commonly known as:  EMLA   ondansetron 8 MG tablet Commonly known as:  ZOFRAN   prochlorperazine 10 MG tablet Commonly known as:  COMPAZINE     TAKE these medications   amoxicillin-clavulanate 875-125 MG tablet Commonly known as:  AUGMENTIN Take 1 tablet by mouth every 12 (twelve) hours.   BIKTARVY 50-200-25 MG Tabs tablet Generic drug:  bictegravir-emtricitabine-tenofovir AF TAKE 1 TABLET BY MOUTH DAILY.   levothyroxine 25 MCG tablet Commonly known as:  SYNTHROID, LEVOTHROID Take 1 tablet (25 mcg total) by mouth daily before breakfast.   LORazepam 0.5 MG tablet Commonly known as:  ATIVAN Take 1 tablet (0.5 mg total) by mouth every 6 (six) hours as needed for anxiety.   magnesium oxide 400 (241.3 Mg) MG tablet Commonly known as:  MAG-OX Take 1 tablet (400 mg total) by mouth 2 (two) times daily.   morphine 60 MG 12 hr tablet Commonly known as:  MS CONTIN Take 1 tablet (60 mg total) by mouth every 12  (twelve) hours.   morphine 30 MG tablet Commonly known as:  MSIR Take 1 tablet (30 mg total) by mouth every 6 (six) hours as needed for severe pain.   phenazopyridine 200 MG tablet Commonly known as:  PYRIDIUM Take 1 tablet (200 mg total) by mouth 3 (three) times daily as needed for pain.   polyethylene glycol packet Commonly known as:  MIRALAX / GLYCOLAX Take 17 g by mouth daily.      No Known Allergies Follow-up Information    Ardis Hughs, MD In 2 weeks.   Specialty:  Urology Why:  Call for follow up Contact information: [place] [address] [phone]            The results of significant diagnostics from this hospitalization (including imaging, microbiology, ancillary and laboratory) are listed below for reference.    Significant Diagnostic Studies: Ct Abdomen Pelvis Wo Contrast  Result Date: 11/01/2018 CLINICAL DATA:  HIV, cervical cancer on chemotherapy. Lower back pain. Hydronephrosis with worsening renal function. EXAM: CT ABDOMEN AND PELVIS WITHOUT CONTRAST TECHNIQUE: Multidetector CT imaging of the abdomen and pelvis was performed following the standard protocol without IV contrast. COMPARISON:  10/26/2018. FINDINGS: Lower chest: Small left pleural effusion with collapse/consolidation in the left lower lobe, new from 10/26/2018. Trace right pleural fluid. Decreased attenuation of  the intravascular compartment is indicative of anemia. Heart size normal. No pericardial effusion. Hepatobiliary: Liver and gallbladder are unremarkable. No biliary ductal dilatation. Pancreas: Negative. Spleen: Negative. Adrenals/Urinary Tract: Adrenal glands are unremarkable. Double-J right ureteral stent is seen with the proximal loop formed in the right renal pelvis and distal loop formed in the bladder. Mild to moderate residual right hydronephrosis, similar to 10/26/2018. Extensive left perinephric fluid, new from 10/26/2018. Moderate left hydronephrosis. Fluid  density lesion in the left kidney measures 4.1 cm, as before. Bladder is grossly unremarkable. Stomach/Bowel: Stomach and small bowel are unremarkable. Appendix is not readily visualized. Stool is seen throughout the colon. Vascular/Lymphatic: Vascular structures are grossly unremarkable. No definite pathologically enlarged lymph nodes. Reproductive: Uterus is visualized. Assess in the patient's cervical cancer is limited without IV contrast. Other: Left perirectal/presacral fluid collection measures approximately 2.7 x 3.7 cm, stable. Associated presacral thickening. Musculoskeletal: Degenerative changes in the spine. No worrisome lytic or sclerotic lesions. IMPRESSION: 1. Large left perinephric fluid collection is new from 10/26/2018 and most likely represents a urinoma related to hydronephrosis and forniceal rupture. 2. Mild to moderate right hydronephrosis with double-J ureteral stent in place. 3. Left perirectal/presacral fluid collection is grossly stable. Abscess is not excluded. 4. New small left pleural effusion. Associated collapse/consolidation in the left lower lobe is likely due to atelectasis. Pneumonia is not excluded. 5. Stool throughout the colon is indicative of constipation. Electronically Signed   By: Lorin Picket M.D.   On: 11/01/2018 16:38   Dg Abd 1 View  Result Date: 10/30/2018 CLINICAL DATA:  53 year old female with abdominal pain and distention for 1 day. Cervical cancer. Right double-J ureteral stent placed earlier this month for right side urologic obstruction. EXAM: ABDOMEN - 1 VIEW COMPARISON:  CT Abdomen and Pelvis 10/26/2018 and earlier. FINDINGS: Right double-J ureteral stent is stable in configuration. Numerous pelvic phleboliths redemonstrated. No nephrolithiasis on the recent CT. Non obstructed bowel gas pattern. Retained stool throughout the colon similar to the recent CT. No acute osseous abnormality identified. IMPRESSION: Stable appearance to the scout view of the  recent CT Abdomen and Pelvis. Stable right double-J ureteral stent. Electronically Signed   By: Genevie Ann M.D.   On: 10/30/2018 12:42   Ct Head Wo Contrast  Result Date: 10/26/2018 CLINICAL DATA:  Altered consciousness. History of cervical cancer. EXAM: CT HEAD WITHOUT CONTRAST TECHNIQUE: Contiguous axial images were obtained from the base of the skull through the vertex without intravenous contrast. COMPARISON:  06/19/2013 FINDINGS: Brain: No mass lesion, hemorrhage, hydrocephalus, acute infarct, intra-axial, or extra-axial fluid collection. Vascular: No hyperdense vessel or unexpected calcification. Skull: Normal Sinuses/Orbits: Normal imaged portions of the orbits and globes. Clear paranasal sinuses and mastoid air cells. Other: None. IMPRESSION: Normal head CT. Electronically Signed   By: Abigail Miyamoto M.D.   On: 10/26/2018 23:54   Ct Abdomen Pelvis W Contrast  Result Date: 10/26/2018 CLINICAL DATA:  Low back pain radiating to the back and left flank. Recent right ureteral stent placement. EXAM: CT ABDOMEN AND PELVIS WITH CONTRAST TECHNIQUE: Multidetector CT imaging of the abdomen and pelvis was performed using the standard protocol following bolus administration of intravenous contrast. CONTRAST:  7mL ISOVUE-300 IOPAMIDOL (ISOVUE-300) INJECTION 61% COMPARISON:  C-arm radiographs dated 10/24/2018. Abdomen and pelvis CT dated 01/03/2018. FINDINGS: Lower chest: Single small right lower lobe bullous. Hepatobiliary: Mild intrahepatic biliary ductal dilatation. Borderline dilated common duct. No obstructing stone or mass visualized. Normal appearing gallbladder. Focal fat deposition in the medial segment of the left  lobe of the liver adjacent to the falciform ligament. Pancreas: Unremarkable. No pancreatic ductal dilatation or surrounding inflammatory changes. Spleen: Normal in size without focal abnormality. Adrenals/Urinary Tract: Right ureteral stent in place. The distal portion is just beyond the  ureterovesical junction in the posterior aspect of the urinary bladder on the right. The proximal portion is on the upper pole collecting system. Interval moderate dilatation of the right renal collecting system and proximal ureter. No visible ureteral calculi. Large number of bilateral pelvic phleboliths. Previously demonstrated left renal cyst. Interval mild-to-moderate dilatation of the left renal collecting system without ureteral dilatation. No left ureteral calculi seen. Small amount of air in the urinary bladder. Stomach/Bowel: Stomach is within normal limits. Appendix appears normal. No evidence of bowel wall thickening, distention, or inflammatory changes. Vascular/Lymphatic: No significant vascular findings are present. No enlarged abdominal or pelvic lymph nodes. Reproductive: Uterus and bilateral adnexa are unremarkable. Other: A recurrent or residual fluid and gas collection with thin surrounding enhancing walls cysts demonstrated in the left pararectal region. On image number 64 series 2, this measures 3.9 x 2.5 cm. On sagittal image number 74, this measures 9.4 cm in length and 4.2 cm in AP diameter, including the presacral region. On coronal image number 76, this measures 2.8 cm in width. It is difficult to distinguish the rectum separate from this collection. Musculoskeletal: Lumbar and lower thoracic spine degenerative changes. IMPRESSION: 1. 9.4 x 4.2 x 2.8 cm recurrent or residual fluid and gas collection in the left perirectal and presacral region, as described above. This has features highly suspicious for a perirectal and presacral abscess. 2. Right ureteral stent in place with interval moderate right hydronephrosis and proximal hydroureter. 3. Interval mild-to-moderate left hydronephrosis without ureteral dilatation compatible interval UPJ obstruction. 4. Mild intrahepatic biliary ductal dilatation. This could be due to a nonvisualized common duct stone or stricture. Electronically Signed    By: Claudie Revering M.D.   On: 10/26/2018 19:07   US Renal  Result Date: 11/01/2018 CLINICAL DATA:  Hydronephrosis EXAM: RENAL / URINARY TRACT ULTRASOUND COMPLETE COMPARISON:  KUB 10/30/2018.  CT abdomen pelvis 10/26/2018 FINDINGS: Right Kidney: Renal measurements: 11.5 x 6.3 x 5.9 cm = volume: 224 mL. Moderate right hydronephrosis. Right ureteral stent extends into the renal pelvis. No mass or calculi. Minimal perinephric fluid on the right. Left Kidney: Renal measurements: 12.6 x 6.2 x 6.4 cm = volume: 263 mL. Mild left hydronephrosis. 4 cm midpole cyst. Large perinephric fluid collection with septations. This was not present on the recent CT. Bladder: Normal bladder.  Stent in the right bladder. IMPRESSION: Moderate hydronephrosis on the right with mild perinephric fluid on the right Mild left hydronephrosis with large complex perinephric fluid collection not present on CT of 10/26/2018. Question hematoma or abscess. Electronically Signed   By: Franchot Gallo M.D.   On: 11/01/2018 07:47   Dg C-arm 1-60 Min-no Report  Result Date: 10/24/2018 Fluoroscopy was utilized by the requesting physician.  No radiographic interpretation.   Ir Nephrostomy Placement Left  Result Date: 11/04/2018 CLINICAL DATA:  Cervical carcinoma with low back pain, hydronephrosis, worsening renal function despite placement of right ureteral stent. CT also demonstrates left perinephric collection suggesting forniceal rupture. Percutaneous nephrostomy decompression requested. EXAM: BILATERAL PERCUTANEOUS NEPHROSTOMY CATHETER PLACEMENT UNDER ULTRASOUND AND FLUOROSCOPIC GUIDANCE FLUOROSCOPY TIME:  2.3 minute; 381 uGym2 DAP TECHNIQUE: The procedure, risks (including but not limited to bleeding, infection, organ damage ), benefits, and alternatives were explained to the patient. Questions regarding the procedure were encouraged  and answered. The patient understands and consents to the procedure. Bilateralflank regions prepped with  chlorhexidine, draped in usual sterile fashion, infiltrated locally with 1% lidocaine.No periprocedural antibiotics prophylaxis was indicated. Intravenous Fentanyl and Versed were administered as conscious sedation during continuous monitoring of the patient's level of consciousness and physiological / cardiorespiratory status by the radiology RN, with a total moderate sedation time of 20 minutes. Under real-time ultrasound guidance, a 21-gauge trocar needle was advanced into a posterior left lower pole calyx. Ultrasound image documentation was saved. Guidewire advanced easily into the renal collecting system and down the proximal ureter. Needle was exchanged over a guidewire for transitional dilator. Contrast injection confirmed appropriate positioning. Catheter was exchanged over a guidewire for a 10 French pigtail catheter, formed centrally within the left renal collecting system. Contrast injection confirms appropriate positioning and patency. In similar fashion, on the right, Under real-time ultrasound guidance, a 21-gauge trocar needle was advanced into a posterior lower pole calyx. Ultrasound image documentation was saved. Urinary returned spontaneously through the needle hub. Guidewire advanced easily into the renal collecting system . Needle was exchanged over a guidewire for transitional dilator. Contrast injection confirmed appropriate positioning. Catheter was exchanged over a guidewire for a 10 French pigtail catheter, formed centrally within the right renal collecting system. Contrast injection confirms appropriate positioning and patency. Catheters secured externally with 0 Prolene suture and StatLock, and placed to external drain bags. The patient tolerated the procedure well. COMPLICATIONS: COMPLICATIONS none IMPRESSION: 1. Technically successful bilateral percutaneous nephrostomy catheter placement. Electronically Signed   By: Lucrezia Europe M.D.   On: 11/04/2018 15:13   Ir Nephrostomy Placement  Right  Result Date: 11/04/2018 CLINICAL DATA:  Cervical carcinoma with low back pain, hydronephrosis, worsening renal function despite placement of right ureteral stent. CT also demonstrates left perinephric collection suggesting forniceal rupture. Percutaneous nephrostomy decompression requested. EXAM: BILATERAL PERCUTANEOUS NEPHROSTOMY CATHETER PLACEMENT UNDER ULTRASOUND AND FLUOROSCOPIC GUIDANCE FLUOROSCOPY TIME:  2.3 minute; 381 uGym2 DAP TECHNIQUE: The procedure, risks (including but not limited to bleeding, infection, organ damage ), benefits, and alternatives were explained to the patient. Questions regarding the procedure were encouraged and answered. The patient understands and consents to the procedure. Bilateralflank regions prepped with chlorhexidine, draped in usual sterile fashion, infiltrated locally with 1% lidocaine.No periprocedural antibiotics prophylaxis was indicated. Intravenous Fentanyl and Versed were administered as conscious sedation during continuous monitoring of the patient's level of consciousness and physiological / cardiorespiratory status by the radiology RN, with a total moderate sedation time of 20 minutes. Under real-time ultrasound guidance, a 21-gauge trocar needle was advanced into a posterior left lower pole calyx. Ultrasound image documentation was saved. Guidewire advanced easily into the renal collecting system and down the proximal ureter. Needle was exchanged over a guidewire for transitional dilator. Contrast injection confirmed appropriate positioning. Catheter was exchanged over a guidewire for a 10 French pigtail catheter, formed centrally within the left renal collecting system. Contrast injection confirms appropriate positioning and patency. In similar fashion, on the right, Under real-time ultrasound guidance, a 21-gauge trocar needle was advanced into a posterior lower pole calyx. Ultrasound image documentation was saved. Urinary returned spontaneously through  the needle hub. Guidewire advanced easily into the renal collecting system . Needle was exchanged over a guidewire for transitional dilator. Contrast injection confirmed appropriate positioning. Catheter was exchanged over a guidewire for a 10 French pigtail catheter, formed centrally within the right renal collecting system. Contrast injection confirms appropriate positioning and patency. Catheters secured externally with 0 Prolene suture and StatLock,  and placed to external drain bags. The patient tolerated the procedure well. COMPLICATIONS: COMPLICATIONS none IMPRESSION: 1. Technically successful bilateral percutaneous nephrostomy catheter placement. Electronically Signed   By: Lucrezia Europe M.D.   On: 11/04/2018 15:13    Microbiology: Recent Results (from the past 240 hour(s))  Body fluid culture     Status: None (Preliminary result)   Collection Time: 11/06/18  6:03 PM  Result Value Ref Range Status   Specimen Description   Final    KIDNEY RIGHT NEPHROSTOMY Performed at [facility] [address], [place], [place] [address]    Special Requests   Final    Normal Performed at [facility], [place] [address] [place], [place] [address]    Gram Stain   Final    WBC PRESENT,BOTH PMN AND MONONUCLEAR NO ORGANISMS SEEN CYTOSPIN SMEAR Performed at [facility], [place] [address] [place], [place] [address]    Culture NO GROWTH 3 DAYS  Final   Report Status PENDING  Incomplete  Body fluid culture     Status: None (Preliminary result)   Collection Time: 11/06/18  6:05 PM  Result Value Ref Range Status   Specimen Description   Final    KIDNEY LEFT NEPHROSTOMY Performed at [facility] [address] [place], [place] [address]    Special Requests   Final    Normal Performed at [facility], [place] [address], [place], [place] [address]    Gram Stain   Final    WBC PRESENT,BOTH PMN AND MONONUCLEAR NO ORGANISMS  SEEN CYTOSPIN SMEAR Performed at [facility], [place] [address], [place], [place] [address]    Culture NO GROWTH 3 DAYS  Final   Report Status PENDING  Incomplete     Labs: Basic Metabolic Panel: Recent Labs  Lab 11/03/18 0425 11/04/18 0449 11/05/18 0900 11/06/18 0508 11/08/18 0348  NA 132* 134* 136 137 138  K 4.8 4.8 4.2 4.2 3.6  CL 105 106 109 110 109  CO2 17* 16* 17* 16* 18*  GLUCOSE 119* 107* 93 92 100*  BUN 60* 58* 41* 34* 21*  CREATININE 3.74* 3.70* 2.00* 1.72* 1.18*  CALCIUM 8.5* 8.6* 8.6* 8.7* 8.5*  MG 2.4  --   --   --   --   PHOS  --   --   --   --  1.8*   Liver Function Tests: Recent Labs  Lab 11/08/18 0348  ALBUMIN 2.7*   No results for input(s): LIPASE, AMYLASE in the last 168 hours. No results for input(s): AMMONIA in the last 168 hours. CBC: Recent Labs  Lab 11/03/18 0910 11/04/18 0449 11/05/18 0345 11/06/18 0508 11/08/18 0348  WBC 1.5* 2.6* 5.2 12.5* 13.9*  NEUTROABS 0.9* 1.4* 3.7 9.7* 9.2*  HGB 7.8* 8.2* 7.8* 8.3* 7.9*  HCT 25.3* 27.0* 25.0* 27.2* 25.5*  MCV 95.8 94.1 96.2 95.1 94.8  PLT 240 303 294 324 288   Cardiac Enzymes: No results for input(s): CKTOTAL, CKMB, CKMBINDEX, TROPONINI in the last 168 hours. BNP: BNP (last 3 results) No results for input(s): BNP in the last 8760 hours.  ProBNP (last 3 results) No results for input(s): PROBNP in the last 8760 hours.  CBG: No results for input(s): GLUCAP in the last 168 hours.     Signed:  Nita Sells MD   [facility] 11/09/2018, 3:22 PM

## 2018-11-09 NOTE — Progress Notes (Signed)
CSW aware PT recommended SNF placement- patient currently has no bed offers at this time.   Kingsley Spittle, [place]  [phone]

## 2018-11-10 LAB — BODY FLUID CULTURE
Culture: NO GROWTH
Culture: NO GROWTH
Special Requests: NORMAL
Special Requests: NORMAL

## 2018-11-14 ENCOUNTER — Telehealth: Payer: Self-pay

## 2018-11-14 ENCOUNTER — Other Ambulatory Visit: Payer: Self-pay | Admitting: Hematology and Oncology

## 2018-11-14 NOTE — Telephone Encounter (Signed)
[facility] called and left a message asking if it is ok for [facility] to resume care.  Called back. Per Dr. Alvy Bimler okay to resume care.

## 2018-11-15 ENCOUNTER — Inpatient Hospital Stay (HOSPITAL_BASED_OUTPATIENT_CLINIC_OR_DEPARTMENT_OTHER): Payer: BLUE CROSS/BLUE SHIELD | Admitting: Hematology and Oncology

## 2018-11-15 ENCOUNTER — Inpatient Hospital Stay: Payer: BLUE CROSS/BLUE SHIELD | Attending: Gynecologic Oncology

## 2018-11-15 ENCOUNTER — Telehealth: Payer: Self-pay | Admitting: Hematology and Oncology

## 2018-11-15 ENCOUNTER — Telehealth: Payer: Self-pay

## 2018-11-15 ENCOUNTER — Inpatient Hospital Stay: Payer: BLUE CROSS/BLUE SHIELD

## 2018-11-15 ENCOUNTER — Encounter: Payer: Self-pay | Admitting: Hematology and Oncology

## 2018-11-15 VITALS — BP 85/41 | HR 122 | Temp 97.9°F | Resp 18 | Ht 61.0 in | Wt 181.2 lb

## 2018-11-15 DIAGNOSIS — C539 Malignant neoplasm of cervix uteri, unspecified: Secondary | ICD-10-CM

## 2018-11-15 DIAGNOSIS — D638 Anemia in other chronic diseases classified elsewhere: Secondary | ICD-10-CM

## 2018-11-15 DIAGNOSIS — N179 Acute kidney failure, unspecified: Secondary | ICD-10-CM

## 2018-11-15 DIAGNOSIS — Z923 Personal history of irradiation: Secondary | ICD-10-CM | POA: Insufficient documentation

## 2018-11-15 DIAGNOSIS — D500 Iron deficiency anemia secondary to blood loss (chronic): Secondary | ICD-10-CM

## 2018-11-15 DIAGNOSIS — C530 Malignant neoplasm of endocervix: Secondary | ICD-10-CM

## 2018-11-15 DIAGNOSIS — N189 Chronic kidney disease, unspecified: Secondary | ICD-10-CM | POA: Insufficient documentation

## 2018-11-15 DIAGNOSIS — D61818 Other pancytopenia: Secondary | ICD-10-CM

## 2018-11-15 DIAGNOSIS — B20 Human immunodeficiency virus [HIV] disease: Secondary | ICD-10-CM | POA: Diagnosis not present

## 2018-11-15 DIAGNOSIS — G893 Neoplasm related pain (acute) (chronic): Secondary | ICD-10-CM | POA: Insufficient documentation

## 2018-11-15 DIAGNOSIS — R600 Localized edema: Secondary | ICD-10-CM | POA: Diagnosis not present

## 2018-11-15 DIAGNOSIS — C799 Secondary malignant neoplasm of unspecified site: Secondary | ICD-10-CM | POA: Insufficient documentation

## 2018-11-15 DIAGNOSIS — Z5111 Encounter for antineoplastic chemotherapy: Secondary | ICD-10-CM | POA: Diagnosis not present

## 2018-11-15 DIAGNOSIS — E1122 Type 2 diabetes mellitus with diabetic chronic kidney disease: Secondary | ICD-10-CM | POA: Diagnosis not present

## 2018-11-15 DIAGNOSIS — C7989 Secondary malignant neoplasm of other specified sites: Secondary | ICD-10-CM | POA: Insufficient documentation

## 2018-11-15 DIAGNOSIS — E860 Dehydration: Secondary | ICD-10-CM | POA: Diagnosis not present

## 2018-11-15 LAB — CBC WITH DIFFERENTIAL/PLATELET
Abs Immature Granulocytes: 0.18 10*3/uL — ABNORMAL HIGH (ref 0.00–0.07)
Basophils Absolute: 0 10*3/uL (ref 0.0–0.1)
Basophils Relative: 0 %
EOS ABS: 0.1 10*3/uL (ref 0.0–0.5)
Eosinophils Relative: 1 %
HEMATOCRIT: 21.4 % — AB (ref 36.0–46.0)
Hemoglobin: 6.7 g/dL — CL (ref 12.0–15.0)
Immature Granulocytes: 2 %
LYMPHS ABS: 0.6 10*3/uL — AB (ref 0.7–4.0)
Lymphocytes Relative: 6 %
MCH: 29.1 pg (ref 26.0–34.0)
MCHC: 31.3 g/dL (ref 30.0–36.0)
MCV: 93 fL (ref 80.0–100.0)
Monocytes Absolute: 0.7 10*3/uL (ref 0.1–1.0)
Monocytes Relative: 6 %
Neutro Abs: 9.5 10*3/uL — ABNORMAL HIGH (ref 1.7–7.7)
Neutrophils Relative %: 85 %
PLATELETS: 389 10*3/uL (ref 150–400)
RBC: 2.3 MIL/uL — ABNORMAL LOW (ref 3.87–5.11)
RDW: 16.2 % — AB (ref 11.5–15.5)
WBC: 11 10*3/uL — ABNORMAL HIGH (ref 4.0–10.5)
nRBC: 0 % (ref 0.0–0.2)

## 2018-11-15 LAB — MAGNESIUM: Magnesium: 1.7 mg/dL (ref 1.7–2.4)

## 2018-11-15 LAB — COMPREHENSIVE METABOLIC PANEL
ALT: 6 U/L (ref 0–44)
AST: 10 U/L — ABNORMAL LOW (ref 15–41)
Albumin: 2.8 g/dL — ABNORMAL LOW (ref 3.5–5.0)
Alkaline Phosphatase: 69 U/L (ref 38–126)
Anion gap: 13 (ref 5–15)
BUN: 25 mg/dL — ABNORMAL HIGH (ref 6–20)
CO2: 16 mmol/L — AB (ref 22–32)
Calcium: 9 mg/dL (ref 8.9–10.3)
Chloride: 109 mmol/L (ref 98–111)
Creatinine, Ser: 2.62 mg/dL — ABNORMAL HIGH (ref 0.44–1.00)
GFR calc Af Amer: 23 mL/min — ABNORMAL LOW (ref >60)
GFR calc non Af Amer: 20 mL/min — ABNORMAL LOW (ref >60)
Glucose, Bld: 103 mg/dL — ABNORMAL HIGH (ref 70–99)
Potassium: 4.2 mmol/L (ref 3.5–5.1)
SODIUM: 138 mmol/L (ref 135–145)
Total Bilirubin: 0.3 mg/dL (ref 0.3–1.2)
Total Protein: 7.5 g/dL (ref 6.5–8.1)

## 2018-11-15 LAB — SAMPLE TO BLOOD BANK

## 2018-11-15 LAB — PREPARE RBC (CROSSMATCH)

## 2018-11-15 MED ORDER — ACETAMINOPHEN 325 MG PO TABS
650.0000 mg | ORAL_TABLET | Freq: Once | ORAL | Status: AC
  Administered 2018-11-15: 650 mg via ORAL

## 2018-11-15 MED ORDER — ACETAMINOPHEN 325 MG PO TABS
ORAL_TABLET | ORAL | Status: AC
  Filled 2018-11-15: qty 2

## 2018-11-15 MED ORDER — SODIUM CHLORIDE 0.9% IV SOLUTION
250.0000 mL | Freq: Once | INTRAVENOUS | Status: AC
  Administered 2018-11-15: 250 mL via INTRAVENOUS
  Filled 2018-11-15: qty 250

## 2018-11-15 MED ORDER — DIPHENHYDRAMINE HCL 25 MG PO CAPS
25.0000 mg | ORAL_CAPSULE | Freq: Once | ORAL | Status: AC
  Administered 2018-11-15: 25 mg via ORAL

## 2018-11-15 MED ORDER — SODIUM CHLORIDE 0.9% FLUSH
10.0000 mL | Freq: Once | INTRAVENOUS | Status: AC
  Administered 2018-11-15: 10 mL
  Filled 2018-11-15: qty 10

## 2018-11-15 MED ORDER — HEPARIN SOD (PORK) LOCK FLUSH 100 UNIT/ML IV SOLN
500.0000 [IU] | Freq: Every day | INTRAVENOUS | Status: AC | PRN
  Administered 2018-11-15: 500 [IU]
  Filled 2018-11-15: qty 5

## 2018-11-15 MED ORDER — DIPHENHYDRAMINE HCL 25 MG PO CAPS
ORAL_CAPSULE | ORAL | Status: AC
  Filled 2018-11-15: qty 1

## 2018-11-15 MED ORDER — SODIUM CHLORIDE 0.9% FLUSH
10.0000 mL | INTRAVENOUS | Status: AC | PRN
  Administered 2018-11-15: 10 mL
  Filled 2018-11-15: qty 10

## 2018-11-15 MED ORDER — LIDOCAINE-PRILOCAINE 2.5-2.5 % EX CREA: 1.0000 "application " | TOPICAL_CREAM | CUTANEOUS | 6 refills | Status: AC | PRN

## 2018-11-15 MED FILL — LIDOCAINE-PRILOCAINE CREAM: 2.5-2.5 | 20 days supply | Qty: 30 | Fill #0

## 2018-11-15 NOTE — Assessment & Plan Note (Signed)
She has adequate pain control right now.

## 2018-11-15 NOTE — Telephone Encounter (Signed)
Spoke with in the infusion room. Given below message and office phone number to call if needed. She verbalized understanding. She does not want IV fluids at this time. She will drink more fluids.

## 2018-11-15 NOTE — Patient Instructions (Signed)
Blood Transfusion, Adult, Care After This sheet gives you information about how to care for yourself after your procedure. Your doctor may also give you more specific instructions. If you have problems or questions, contact your doctor. Follow these instructions at home:   Take over-the-counter and prescription medicines only as told by your doctor.  Go back to your normal activities as told by your doctor.  Follow instructions from your doctor about how to take care of the area where an IV tube was put into your vein (insertion site). Make sure you: ? Wash your hands with soap and water before you change your bandage (dressing). If there is no soap and water, use hand sanitizer. ? Change your bandage as told by your doctor.  Check your IV insertion site every day for signs of infection. Check for: ? More redness, swelling, or pain. ? More fluid or blood. ? Warmth. ? Pus or a bad smell. Contact a doctor if:  You have more redness, swelling, or pain around the IV insertion site.  You have more fluid or blood coming from the IV insertion site.  Your IV insertion site feels warm to the touch.  You have pus or a bad smell coming from the IV insertion site.  Your pee (urine) turns pink, red, or brown.  You feel weak after doing your normal activities. Get help right away if:  You have signs of a serious allergic or body defense (immune) system reaction, including: ? Itchiness. ? Hives. ? Trouble breathing. ? Anxiety. ? Pain in your chest or lower back. ? Fever, flushing, and chills. ? Fast pulse. ? Rash. ? Watery poop (diarrhea). ? Throwing up (vomiting). ? Dark pee. ? Serious headache. ? Dizziness. ? Stiff neck. ? Yellow color in your face or the white parts of your eyes (jaundice). Summary  After a blood transfusion, return to your normal activities as told by your doctor.  Every day, check for signs of infection where the IV tube was put into your vein.  Some  signs of infection are warm skin, more redness and pain, more fluid or blood, and pus or a bad smell where the needle went in.  Contact your doctor if you feel weak or have any unusual symptoms. This information is not intended to replace advice given to you by your health care provider. Make sure you discuss any questions you have with your health care provider. Document Released: 11/14/2014 Document Revised: 06/17/2016 Document Reviewed: 06/17/2016 Elsevier Interactive Patient Education  2019 [employer]  

## 2018-11-15 NOTE — Progress Notes (Signed)
[facility] OFF[facility] PROGRESS NOTE  Patient Care Team: Patient, No Pcp Per as PCP - General (General Practice)  ASSESSMENT & PLAN:  Primary cervical cancer with metastasis - Stage IV I have significant discussion with the patient and her husband The patient has significant events since her last chemotherapy We discussed continuing chemotherapy with carboplatin and Taxol with dose adjustment versus pembrolizumab versus hospice The patient would like to proceed with chemotherapy I would adjust the dose accordingly I plan to see her back midcycle for toxicity review and supportive care  Pancytopenia, acquired ([place]) She has multifactorial anemia, due to anemia chronic disease, recent chemotherapy and renal failure  We discussed some of the risks, benefits, and alternatives of blood transfusions. The patient is symptomatic from anemia and the hemoglobin level is critically low.  Some of the side-effects to be expected including risks of transfusion reactions, chills, infection, syndrome of volume overload and risk of hospitalization from various reasons and the patient is willing to proceed and went ahead to sign consent today. I will order 2 units of blood and plan to recheck it again in about 10 days  AKI (acute kidney injury) ([place]) She has recent renal failure status post percutaneous stent placement I suspect the patient has significant dehydration I recommend increase oral fluid or IV fluid support at home I will reduce the dose of both chemotherapy  Cancer associated pain She has adequate pain control right now.   Orders Placed This Encounter  Procedures  . Prepare RBC    Standing Status:   Standing    Number of Occurrences:   1    Order Specific Question:   # of Units    Answer:   2 units    Order Specific Question:   Transfusion Indications    Answer:   Symptomatic Anemia    Order Specific Question:   Special Requirements    Answer:   Irradiated    Order  Specific Question:   If emergent release call blood bank    Answer:   [facility] [phone]  . Type and screen    INTERVAL HISTORY: Please see below for problem oriented charting. She returns for further follow-up with her husband She felt well since discharge home She denies excessive pain The patient denies any recent signs or symptoms of bleeding such as spontaneous epistaxis, hematuria or hematochezia. No recent infection, fever or chills Denies nausea or constipation  SUMMARY OF ONCOLOGIC HISTORY: Oncology History   PD-L 1 40%     Primary cervical cancer with metastasis - Stage IV   01/04/2017 Initial Diagnosis    She went to urgent care service for evaluation of abnormal vaginal symptoms with discharge and was diagnosed with bacterial vaginosis    01/03/2018 Imaging    1. Complex thick-walled fluid collection/abscess the lower pelvis with possible communication to the lower uterine/vagina. Clinical correlation and further evaluation with pelvic ultrasound recommended. CT with IV and delayed oral and rectal contrast may provide additional information. A smaller collection is also noted in the left posterior hemipelvis the left of the rectum. 2. No bowel obstruction or active inflammation.  Normal appendix. Please note the described 5.0 x 5.5 cm complex collection within the pelvis appears to be in the region of the cervix and may represent a necrotic mass/neoplasm. The described 3 x 3 cm low attenuating lesion to the left of the rectum may represent an abnormal lymph node.     01/04/2018 Pathology Results  Cervix, biopsy - POORLY DIFFERENTIATED CARCINOMA - SEE COMMENT Microscopic Comment The biopsy material consists of multiple fragments of tissue with infiltrative tumor and granulation tissue. By immunohistochemistry, the tumor is positive for cytokeratin 5/6, p63, p16 and vimentin (diffusely positive) but negative for ER. 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    01/04/2018 Imaging    Pelvic US 1. Heterogeneous area of irregularity with Doppler flow in the region of the cervix. A cervical mass is not excluded. Clinical correlation is recommended.  2. Rounded complex mass/collection posterior to the lower uterus/vagina corresponding to the larger complex collection seen on the earlier CT. This may represent a necrotic or purulent collection. 3. Grossly unremarkable left ovary. Nonvisualization the right ovary.    01/04/2018 Procedure    She underwent examination under anesthesia Procedure: pt taken to the operating room where gen anesthesia was performed. She was placed in the dorsal lithotomy position and prepped and draped in the usual sterile fashion. A bivalve speculum was placed in the pts vagina and a macerated cervix was noted. There was very little that resembled a normal cervix. The tissue was necrotic and malodorous.  Several biopsies were obtained. There was actually very little bleeding from the biopsy sites although there was bleeding from within the uterine cavity above the location of the cervix.       01/22/2018 PET scan    1. Large hypermetabolic cervical mass with local invasion into the perirectal space. Bilateral pelvic adenopathy noted with small but hypermetabolic pelvic lymph nodes compatible with malignancy. I do not see definite hypermetabolic adenopathy in the upper abdomen, but there are scattered small bilateral hypermetabolic lymph nodes in the neck and axillary regions of uncertain significance. Given the low level activity and the skipped region in the abdomen, it is possible that the neck and chest lymph nodes are simply reactive, as usually I would expect to see more upper abdominal adenopathy were there involvement in the neck and chest. Surveillance is probably warranted. 2. Bilateral mild diffuse thyroid activity suggesting thyroiditis. 3. Other imaging findings of potential clinical  significance: Aortic Atherosclerosis (ICD10-I70.0). Mild cardiomegaly. Bilateral iliac artery atherosclerosis. Suspected uterine fundal fibroid.     01/25/2018 Cancer Staging    Staging form: Cervix Uteri, AJCC 8th Edition - Clinical: FIGO Stage IVA (cT4, cN1, cM0) - Signed by [name], MD on 01/25/2018    02/01/2018 Procedure    Successful 8 French right internal jugular vein power port placement with its tip at the SVC/RA junction.    02/05/2018 - 04/18/2018 Radiation Therapy    Radiation treatment dates:   External therapy: 02/05/2018-03/16/2018, Brachytherapy: 03/20/2018, 03/26/2018, 04/04/2018, 04/12/2018, 04/18/2018  Site/dose:   1. Cervix/pelvis, 1.8 Gy in 25 fractions for a total dose of 45 Gy                      2. Sidewall/ parametrial Boost, 1.8 Gy in 5 fractions for a total dose of 9 Gy                      3. cervix, 5.5 Gy/fx,  5 fractions for a total dose of 27.5 Gy  Beams/energy:   1. 3D, 15X                              2. 3D, 15X  3. HDR, Ir-192, tandem ring system     02/09/2018 - 03/09/2018 Chemotherapy    The patient had weekly cisplatin    07/12/2018 PET scan    Significant response to therapy with decreased hypermetabolic cervical soft tissue mass, left perirectal/presacral soft tissue density, and bilateral iliac lymphadenopathy. No new or progressive metastatic disease identified.  Increased mild right hydronephrosis noted.  Stable thyroiditis, hepatic steatosis, and small uterine fibroid    10/09/2018 PET scan    1. Degradation secondary to patient body habitus. 2. Disease progression, as evidenced by increase in hypermetabolism within the cervix and a perirectal area of soft tissue density and gas. Progressive left common iliac and new retroperitoneal abdominal hypermetabolism, suspicious for nodal metastasis. 3. Gas within the cervical region could be related to necrosis from radiation therapy. Fistulous communication to bowel could look  similar. 4. Similar to mild increase in moderate right-sided hydronephrosis. 5. Thyroid hypermetabolism, again suggesting thyroiditis.    10/24/2018 Surgery    Procedure: 1. Cystoscopy, bilateral retrograde pyelogram with interpretation 2. Right ureteral stent placement   Surgeon: Ardis Hughs, MD  Intraoperative findings: #1:  Cystoscopy demonstrated some bullous edema within the bladder, but no discrete or obvious mucosal lesions.  The ureters were orthotopic. #2: Left retrograde pyelogram demonstrated some slight narrowing within the distal ureter, but there was no significant hydroureteronephrosis. #3: The right retrograde pyelogram demonstrated a dense 3 cm narrow stricture of the distal ureter and UVJ.  There was also a narrowing in the mid ureter just above the pelvic inlet that was approximately 2-1/2 cm long. #4: A 24 cm x 6 French Bard double-J inlay stent was placed in the patient's right ureter without complication. #5: An exam under anesthesia demonstrated a very hard and mobile and nodular lesion on the anterior rectal wall.     10/25/2018 -  Chemotherapy    The patient had carboplatin and Taxol    10/26/2018 Imaging    Ct abdomen and pelvis 1. 9.4 x 4.2 x 2.8 cm recurrent or residual fluid and gas collection in the left perirectal and presacral region, as described above. This has features highly suspicious for a perirectal and presacral abscess. 2. Right ureteral stent in place with interval moderate right hydronephrosis and proximal hydroureter. 3. Interval mild-to-moderate left hydronephrosis without ureteral dilatation compatible interval UPJ obstruction. 4. Mild intrahepatic biliary ductal dilatation. This could be due to a nonvisualized common duct stone or stricture.     10/26/2018 Imaging    Ct head: Normal head CT    10/26/2018 - 11/09/2018 Hospital Admission    She was admitted to the hospital due to uncontrolled pelvic pain.  CT imaging revealed  possible abscess.  Due to acute renal failure, she had percutaneous stent placement    11/04/2018 Procedure    Technically successful bilateral percutaneous nephrostomy catheter placement.     REVIEW OF SYSTEMS:   Constitutional: Denies fevers, chills or abnormal weight loss Eyes: Denies blurriness of vision Ears, nose, mouth, throat, and face: Denies mucositis or sore throat Respiratory: Denies cough, dyspnea or wheezes Cardiovascular: Denies palpitation, chest discomfort or lower extremity swelling Gastrointestinal:  Denies nausea, heartburn or change in bowel habits Skin: Denies abnormal skin rashes Lymphatics: Denies new lymphadenopathy or easy bruising Neurological:Denies numbness, tingling or new weaknesses Behavioral/Psych: Mood is stable, no new changes  All other systems were reviewed with the patient and are negative.  I have reviewed the past medical history, past surgical history, social history and family history with the  patient and they are unchanged from previous note.  ALLERGIES:  has No Known Allergies.  MEDICATIONS:  Current Outpatient Medications  Medication Sig Dispense Refill  . dexamethasone (DECADRON) 4 MG tablet Take 4 mg by mouth as directed. Take 5 pills the night before and 5 pills the morning of chemo, every 3 weeks    . ondansetron (ZOFRAN) 8 MG tablet Take 8 mg by mouth every 8 (eight) hours as needed.    . prochlorperazine (COMPAZINE) 10 MG tablet Take 10 mg by mouth every 6 (six) hours as needed.    Marland Kitchen amoxicillin-clavulanate (AUGMENTIN) 875-125 MG tablet Take 1 tablet by mouth every 12 (twelve) hours. 8 tablet 0  . BIKTARVY 50-200-25 MG TABS tablet TAKE 1 TABLET BY MOUTH DAILY. 30 tablet 5  . levothyroxine (SYNTHROID, LEVOTHROID) 25 MCG tablet Take 1 tablet (25 mcg total) by mouth daily before breakfast. 30 tablet 1  . lidocaine-prilocaine (EMLA) cream Apply 1 application topically as needed. 30 g 6  . LORazepam (ATIVAN) 0.5 MG tablet Take 1 tablet  (0.5 mg total) by mouth every 6 (six) hours as needed for anxiety. 30 tablet 0  . magnesium oxide (MAG-OX) 400 (241.3 Mg) MG tablet Take 1 tablet (400 mg total) by mouth 2 (two) times daily. 60 tablet 3  . morphine (MS CONTIN) 60 MG 12 hr tablet Take 1 tablet (60 mg total) by mouth every 12 (twelve) hours. 60 tablet 0  . morphine (MSIR) 30 MG tablet Take 1 tablet (30 mg total) by mouth every 6 (six) hours as needed for severe pain. 90 tablet 0  . phenazopyridine (PYRIDIUM) 200 MG tablet Take 1 tablet (200 mg total) by mouth 3 (three) times daily as needed for pain. 10 tablet 0  . polyethylene glycol (MIRALAX / GLYCOLAX) packet Take 17 g by mouth daily.     No current facility-administered medications for this visit.     PHYSICAL EXAMINATION: ECOG PERFORMANCE STATUS: 2 - Symptomatic, <50% confined to bed  Vitals:   11/15/18 1201  BP: (!) 85/41  Pulse: (!) 122  Resp: 18  Temp: 97.9 F (36.6 C)  SpO2: 100%   Filed Weights   11/15/18 1201  Weight: 181 lb 3.2 oz (82.2 kg)    GENERAL:alert, no distress and comfortable SKIN: skin color is pale, texture, turgor are normal, no rashes or significant lesions EYES: normal, Conjunctiva are pale and non-injected, sclera clear OROPHARYNX:no exudate, no erythema and lips, buccal mucosa, and tongue normal  NECK: supple, thyroid normal size, non-tender, without nodularity LYMPH:  no palpable lymphadenopathy in the cervical, axillary or inguinal LUNGS: clear to auscultation and percussion with normal breathing effort HEART: regular rate & rhythm and no murmurs and no lower extremity edema ABDOMEN:abdomen soft, non-tender and normal bowel sounds Musculoskeletal:no cyanosis of digits and no clubbing  NEURO: alert & oriented x 3 with fluent speech, no focal motor/sensory deficits  LABORATORY DATA:  I have reviewed the data as listed    Component Value Date/Time   NA 138 11/15/2018 1135   K 4.2 11/15/2018 1135   CL 109 11/15/2018 1135   CO2 16  (L) 11/15/2018 1135   GLUCOSE 103 (H) 11/15/2018 1135   BUN 25 (H) 11/15/2018 1135   CREATININE 2.62 (H) 11/15/2018 1135   CREATININE 1.30 (H) 06/19/2018 1155   CALCIUM 9.0 11/15/2018 1135   PROT 7.5 11/15/2018 1135   ALBUMIN 2.8 (L) 11/15/2018 1135   AST 10 (L) 11/15/2018 1135   ALT 6 11/15/2018 1135  ALKPHOS 69 11/15/2018 1135   BILITOT 0.3 11/15/2018 1135   GFRNONAA 20 (L) 11/15/2018 1135   GFRNONAA 47 (L) 06/19/2018 1155   GFRAA 23 (L) 11/15/2018 1135   GFRAA 55 (L) 06/19/2018 1155    No results found for: SPEP, UPEP  Lab Results  Component Value Date   WBC 11.0 (H) 11/15/2018   NEUTROABS 9.5 (H) 11/15/2018   HGB 6.7 (LL) 11/15/2018   HCT 21.4 (L) 11/15/2018   MCV 93.0 11/15/2018   PLT 389 11/15/2018      Chemistry      Component Value Date/Time   NA 138 11/15/2018 1135   K 4.2 11/15/2018 1135   CL 109 11/15/2018 1135   CO2 16 (L) 11/15/2018 1135   BUN 25 (H) 11/15/2018 1135   CREATININE 2.62 (H) 11/15/2018 1135   CREATININE 1.30 (H) 06/19/2018 1155      Component Value Date/Time   CALCIUM 9.0 11/15/2018 1135   ALKPHOS 69 11/15/2018 1135   AST 10 (L) 11/15/2018 1135   ALT 6 11/15/2018 1135   BILITOT 0.3 11/15/2018 1135       RADIOGRAPHIC STUDIES: I have personally reviewed the radiological images as listed and agreed with the findings in the report. Ct Abdomen Pelvis Wo Contrast  Result Date: 11/01/2018 CLINICAL DATA:  HIV, cervical cancer on chemotherapy. Lower back pain. Hydronephrosis with worsening renal function. EXAM: CT ABDOMEN AND PELVIS WITHOUT CONTRAST TECHNIQUE: Multidetector CT imaging of the abdomen and pelvis was performed following the standard protocol without IV contrast. COMPARISON:  10/26/2018. FINDINGS: Lower chest: Small left pleural effusion with collapse/consolidation in the left lower lobe, new from 10/26/2018. Trace right pleural fluid. Decreased attenuation of the intravascular compartment is indicative of anemia. Heart size  normal. No pericardial effusion. Hepatobiliary: Liver and gallbladder are unremarkable. No biliary ductal dilatation. Pancreas: Negative. Spleen: Negative. Adrenals/Urinary Tract: Adrenal glands are unremarkable. Double-J right ureteral stent is seen with the proximal loop formed in the right renal pelvis and distal loop formed in the bladder. Mild to moderate residual right hydronephrosis, similar to 10/26/2018. Extensive left perinephric fluid, new from 10/26/2018. Moderate left hydronephrosis. Fluid density lesion in the left kidney measures 4.1 cm, as before. Bladder is grossly unremarkable. Stomach/Bowel: Stomach and small bowel are unremarkable. Appendix is not readily visualized. Stool is seen throughout the colon. Vascular/Lymphatic: Vascular structures are grossly unremarkable. No definite pathologically enlarged lymph nodes. Reproductive: Uterus is visualized. Assess in the patient's cervical cancer is limited without IV contrast. Other: Left perirectal/presacral fluid collection measures approximately 2.7 x 3.7 cm, stable. Associated presacral thickening. Musculoskeletal: Degenerative changes in the spine. No worrisome lytic or sclerotic lesions. IMPRESSION: 1. Large left perinephric fluid collection is new from 10/26/2018 and most likely represents a urinoma related to hydronephrosis and forniceal rupture. 2. Mild to moderate right hydronephrosis with double-J ureteral stent in place. 3. Left perirectal/presacral fluid collection is grossly stable. Abscess is not excluded. 4. New small left pleural effusion. Associated collapse/consolidation in the left lower lobe is likely due to atelectasis. Pneumonia is not excluded. 5. Stool throughout the colon is indicative of constipation. Electronically Signed   By: Lorin Picket M.D.   On: 11/01/2018 16:38   Dg Abd 1 View  Result Date: 10/30/2018 CLINICAL DATA:  53 year old female with abdominal pain and distention for 1 day. Cervical cancer. Right  double-J ureteral stent placed earlier this month for right side urologic obstruction. EXAM: ABDOMEN - 1 VIEW COMPARISON:  CT Abdomen and Pelvis 10/26/2018 and earlier. FINDINGS: Right  double-J ureteral stent is stable in configuration. Numerous pelvic phleboliths redemonstrated. No nephrolithiasis on the recent CT. Non obstructed bowel gas pattern. Retained stool throughout the colon similar to the recent CT. No acute osseous abnormality identified. IMPRESSION: Stable appearance to the scout view of the recent CT Abdomen and Pelvis. Stable right double-J ureteral stent. Electronically Signed   By: Genevie Ann M.D.   On: 10/30/2018 12:42   Ct Head Wo Contrast  Result Date: 10/26/2018 CLINICAL DATA:  Altered consciousness. History of cervical cancer. EXAM: CT HEAD WITHOUT CONTRAST TECHNIQUE: Contiguous axial images were obtained from the base of the skull through the vertex without intravenous contrast. COMPARISON:  06/19/2013 FINDINGS: Brain: No mass lesion, hemorrhage, hydrocephalus, acute infarct, intra-axial, or extra-axial fluid collection. Vascular: No hyperdense vessel or unexpected calcification. Skull: Normal Sinuses/Orbits: Normal imaged portions of the orbits and globes. Clear paranasal sinuses and mastoid air cells. Other: None. IMPRESSION: Normal head CT. Electronically Signed   By: Abigail Miyamoto M.D.   On: 10/26/2018 23:54   Ct Abdomen Pelvis W Contrast  Result Date: 10/26/2018 CLINICAL DATA:  Low back pain radiating to the back and left flank. Recent right ureteral stent placement. EXAM: CT ABDOMEN AND PELVIS WITH CONTRAST TECHNIQUE: Multidetector CT imaging of the abdomen and pelvis was performed using the standard protocol following bolus administration of intravenous contrast. CONTRAST:  90mL ISOVUE-300 IOPAMIDOL (ISOVUE-300) INJECTION 61% COMPARISON:  C-arm radiographs dated 10/24/2018. Abdomen and pelvis CT dated 01/03/2018. FINDINGS: Lower chest: Single small right lower lobe bullous.  Hepatobiliary: Mild intrahepatic biliary ductal dilatation. Borderline dilated common duct. No obstructing stone or mass visualized. Normal appearing gallbladder. Focal fat deposition in the medial segment of the left lobe of the liver adjacent to the falciform ligament. Pancreas: Unremarkable. No pancreatic ductal dilatation or surrounding inflammatory changes. Spleen: Normal in size without focal abnormality. Adrenals/Urinary Tract: Right ureteral stent in place. The distal portion is just beyond the ureterovesical junction in the posterior aspect of the urinary bladder on the right. The proximal portion is on the upper pole collecting system. Interval moderate dilatation of the right renal collecting system and proximal ureter. No visible ureteral calculi. Large number of bilateral pelvic phleboliths. Previously demonstrated left renal cyst. Interval mild-to-moderate dilatation of the left renal collecting system without ureteral dilatation. No left ureteral calculi seen. Small amount of air in the urinary bladder. Stomach/Bowel: Stomach is within normal limits. Appendix appears normal. No evidence of bowel wall thickening, distention, or inflammatory changes. Vascular/Lymphatic: No significant vascular findings are present. No enlarged abdominal or pelvic lymph nodes. Reproductive: Uterus and bilateral adnexa are unremarkable. Other: A recurrent or residual fluid and gas collection with thin surrounding enhancing walls cysts demonstrated in the left pararectal region. On image number 64 series 2, this measures 3.9 x 2.5 cm. On sagittal image number 74, this measures 9.4 cm in length and 4.2 cm in AP diameter, including the presacral region. On coronal image number 76, this measures 2.8 cm in width. It is difficult to distinguish the rectum separate from this collection. Musculoskeletal: Lumbar and lower thoracic spine degenerative changes. IMPRESSION: 1. 9.4 x 4.2 x 2.8 cm recurrent or residual fluid and gas  collection in the left perirectal and presacral region, as described above. This has features highly suspicious for a perirectal and presacral abscess. 2. Right ureteral stent in place with interval moderate right hydronephrosis and proximal hydroureter. 3. Interval mild-to-moderate left hydronephrosis without ureteral dilatation compatible interval UPJ obstruction. 4. Mild intrahepatic biliary ductal dilatation. This could  be due to a nonvisualized common duct stone or stricture. Electronically Signed   By: Claudie Revering M.D.   On: 10/26/2018 19:07   US Renal  Result Date: 11/01/2018 CLINICAL DATA:  Hydronephrosis EXAM: RENAL / URINARY TRACT ULTRASOUND COMPLETE COMPARISON:  [facility] 10/30/2018.  CT abdomen pelvis 10/26/2018 FINDINGS: Right Kidney: Renal measurements: 11.5 x 6.3 x 5.9 cm = volume: 224 mL. Moderate right hydronephrosis. Right ureteral stent extends into the renal pelvis. No mass or calculi. Minimal perinephric fluid on the right. Left Kidney: Renal measurements: 12.6 x 6.2 x 6.4 cm = volume: 263 mL. Mild left hydronephrosis. 4 cm midpole cyst. Large perinephric fluid collection with septations. This was not present on the recent CT. Bladder: Normal bladder.  Stent in the right bladder. IMPRESSION: Moderate hydronephrosis on the right with mild perinephric fluid on the right Mild left hydronephrosis with large complex perinephric fluid collection not present on CT of 10/26/2018. Question hematoma or abscess. Electronically Signed   By: Franchot Gallo M.D.   On: 11/01/2018 07:47   Dg C-arm 1-60 Min-no Report  Result Date: 10/24/2018 Fluoroscopy was utilized by the requesting physician.  No radiographic interpretation.   Ir Nephrostomy Placement Left  Result Date: 11/04/2018 CLINICAL DATA:  Cervical carcinoma with low back pain, hydronephrosis, worsening renal function despite placement of right ureteral stent. CT also demonstrates left perinephric collection suggesting forniceal rupture.  Percutaneous nephrostomy decompression requested. EXAM: BILATERAL PERCUTANEOUS NEPHROSTOMY CATHETER PLACEMENT UNDER ULTRASOUND AND FLUOROSCOPIC GUIDANCE FLUOROSCOPY TIME:  2.3 minute; 381 uGym2 DAP TECHNIQUE: The procedure, risks (including but not limited to bleeding, infection, organ damage ), benefits, and alternatives were explained to the patient. Questions regarding the procedure were encouraged and answered. The patient understands and consents to the procedure. Bilateralflank regions prepped with chlorhexidine, draped in usual sterile fashion, infiltrated locally with 1% lidocaine.No periprocedural antibiotics prophylaxis was indicated. Intravenous Fentanyl and Versed were administered as conscious sedation during continuous monitoring of the patient's level of consciousness and physiological / cardiorespiratory status by the radiology RN, with a total moderate sedation time of 20 minutes. Under real-time ultrasound guidance, a 21-gauge trocar needle was advanced into a posterior left lower pole calyx. Ultrasound image documentation was saved. Guidewire advanced easily into the renal collecting system and down the proximal ureter. Needle was exchanged over a guidewire for transitional dilator. Contrast injection confirmed appropriate positioning. Catheter was exchanged over a guidewire for a 10 French pigtail catheter, formed centrally within the left renal collecting system. Contrast injection confirms appropriate positioning and patency. In similar fashion, on the right, Under real-time ultrasound guidance, a 21-gauge trocar needle was advanced into a posterior lower pole calyx. Ultrasound image documentation was saved. Urinary returned spontaneously through the needle hub. Guidewire advanced easily into the renal collecting system . Needle was exchanged over a guidewire for transitional dilator. Contrast injection confirmed appropriate positioning. Catheter was exchanged over a guidewire for a 10 French  pigtail catheter, formed centrally within the right renal collecting system. Contrast injection confirms appropriate positioning and patency. Catheters secured externally with 0 Prolene suture and StatLock, and placed to external drain bags. The patient tolerated the procedure well. COMPLICATIONS: COMPLICATIONS none IMPRESSION: 1. Technically successful bilateral percutaneous nephrostomy catheter placement. Electronically Signed   By: Lucrezia Europe M.D.   On: 11/04/2018 15:13   Ir Nephrostomy Placement Right  Result Date: 11/04/2018 CLINICAL DATA:  Cervical carcinoma with low back pain, hydronephrosis, worsening renal function despite placement of right ureteral stent. CT also demonstrates left perinephric collection suggesting  forniceal rupture. Percutaneous nephrostomy decompression requested. EXAM: BILATERAL PERCUTANEOUS NEPHROSTOMY CATHETER PLACEMENT UNDER ULTRASOUND AND FLUOROSCOPIC GUIDANCE FLUOROSCOPY TIME:  2.3 minute; 381 uGym2 DAP TECHNIQUE: The procedure, risks (including but not limited to bleeding, infection, organ damage ), benefits, and alternatives were explained to the patient. Questions regarding the procedure were encouraged and answered. The patient understands and consents to the procedure. Bilateralflank regions prepped with chlorhexidine, draped in usual sterile fashion, infiltrated locally with 1% lidocaine.No periprocedural antibiotics prophylaxis was indicated. Intravenous Fentanyl and Versed were administered as conscious sedation during continuous monitoring of the patient's level of consciousness and physiological / cardiorespiratory status by the radiology RN, with a total moderate sedation time of 20 minutes. Under real-time ultrasound guidance, a 21-gauge trocar needle was advanced into a posterior left lower pole calyx. Ultrasound image documentation was saved. Guidewire advanced easily into the renal collecting system and down the proximal ureter. Needle was exchanged over a  guidewire for transitional dilator. Contrast injection confirmed appropriate positioning. Catheter was exchanged over a guidewire for a 10 French pigtail catheter, formed centrally within the left renal collecting system. Contrast injection confirms appropriate positioning and patency. In similar fashion, on the right, Under real-time ultrasound guidance, a 21-gauge trocar needle was advanced into a posterior lower pole calyx. Ultrasound image documentation was saved. Urinary returned spontaneously through the needle hub. Guidewire advanced easily into the renal collecting system . Needle was exchanged over a guidewire for transitional dilator. Contrast injection confirmed appropriate positioning. Catheter was exchanged over a guidewire for a 10 French pigtail catheter, formed centrally within the right renal collecting system. Contrast injection confirms appropriate positioning and patency. Catheters secured externally with 0 Prolene suture and StatLock, and placed to external drain bags. The patient tolerated the procedure well. COMPLICATIONS: COMPLICATIONS none IMPRESSION: 1. Technically successful bilateral percutaneous nephrostomy catheter placement. Electronically Signed   By: Lucrezia Europe M.D.   On: 11/04/2018 15:13    All questions were answered. The patient knows to call the clinic with any problems, questions or concerns. No barriers to learning was detected.  I spent 30 minutes counseling the patient face to face. The total time spent in the appointment was 40 minutes and more than 50% was on counseling and review of test results  [name], MD 11/15/2018 12:55 PM

## 2018-11-15 NOTE — Telephone Encounter (Signed)
Gave avs and calendar ° °

## 2018-11-15 NOTE — Assessment & Plan Note (Signed)
She has recent renal failure status post percutaneous stent placement I suspect the patient has significant dehydration I recommend increase oral fluid or IV fluid support at home I will reduce the dose of both chemotherapy

## 2018-11-15 NOTE — Assessment & Plan Note (Signed)
I have significant discussion with the patient and her husband The patient has significant events since her last chemotherapy We discussed continuing chemotherapy with carboplatin and Taxol with dose adjustment versus pembrolizumab versus hospice The patient would like to proceed with chemotherapy I would adjust the dose accordingly I plan to see her back midcycle for toxicity review and supportive care

## 2018-11-15 NOTE — Telephone Encounter (Signed)
-----   Message from [name], MD sent at 11/15/2018 12:48 PM EST ----- Regarding: creatinine is high Tell her she needs to drink more liquids If not able, we can get [facility] to deliver IVF daily ----- Message ----- From: Buel Ream, [facility] [place] Sent: 11/15/2018  12:07 PM EST To: [name], MD

## 2018-11-15 NOTE — Assessment & Plan Note (Signed)
She has multifactorial anemia, due to anemia chronic disease, recent chemotherapy and renal failure  We discussed some of the risks, benefits, and alternatives of blood transfusions. The patient is symptomatic from anemia and the hemoglobin level is critically low.  Some of the side-effects to be expected including risks of transfusion reactions, chills, infection, syndrome of volume overload and risk of hospitalization from various reasons and the patient is willing to proceed and went ahead to sign consent today. I will order 2 units of blood and plan to recheck it again in about 10 days

## 2018-11-16 ENCOUNTER — Inpatient Hospital Stay: Payer: BLUE CROSS/BLUE SHIELD

## 2018-11-16 VITALS — BP 142/85 | HR 60 | Temp 98.1°F | Resp 16

## 2018-11-16 DIAGNOSIS — C530 Malignant neoplasm of endocervix: Secondary | ICD-10-CM | POA: Diagnosis not present

## 2018-11-16 DIAGNOSIS — D500 Iron deficiency anemia secondary to blood loss (chronic): Secondary | ICD-10-CM

## 2018-11-16 DIAGNOSIS — C539 Malignant neoplasm of cervix uteri, unspecified: Secondary | ICD-10-CM

## 2018-11-16 MED ORDER — PALONOSETRON HCL INJECTION 0.25 MG/5ML
INTRAVENOUS | Status: AC
  Filled 2018-11-16: qty 5

## 2018-11-16 MED ORDER — DIPHENHYDRAMINE HCL 50 MG/ML IJ SOLN
50.0000 mg | Freq: Once | INTRAMUSCULAR | Status: AC
  Administered 2018-11-16: 50 mg via INTRAVENOUS

## 2018-11-16 MED ORDER — SODIUM CHLORIDE 0.9 % IV SOLN
Freq: Once | INTRAVENOUS | Status: AC
  Administered 2018-11-16: 10:00:00 via INTRAVENOUS
  Filled 2018-11-16: qty 250

## 2018-11-16 MED ORDER — SODIUM CHLORIDE 0.9 % IV SOLN
292.5000 mg | Freq: Once | INTRAVENOUS | Status: AC
  Administered 2018-11-16: 290 mg via INTRAVENOUS
  Filled 2018-11-16: qty 29

## 2018-11-16 MED ORDER — DIPHENHYDRAMINE HCL 50 MG/ML IJ SOLN
INTRAMUSCULAR | Status: AC
  Filled 2018-11-16: qty 1

## 2018-11-16 MED ORDER — SODIUM CHLORIDE 0.9% IV SOLUTION
250.0000 mL | Freq: Once | INTRAVENOUS | Status: DC
  Filled 2018-11-16: qty 250

## 2018-11-16 MED ORDER — ACETAMINOPHEN 325 MG PO TABS
ORAL_TABLET | ORAL | Status: AC
  Filled 2018-11-16: qty 2

## 2018-11-16 MED ORDER — SODIUM CHLORIDE 0.9 % IV SOLN
140.0000 mg/m2 | Freq: Once | INTRAVENOUS | Status: AC
  Administered 2018-11-16: 270 mg via INTRAVENOUS
  Filled 2018-11-16: qty 45

## 2018-11-16 MED ORDER — DIPHENHYDRAMINE HCL 25 MG PO CAPS
ORAL_CAPSULE | ORAL | Status: AC
  Filled 2018-11-16: qty 1

## 2018-11-16 MED ORDER — FAMOTIDINE IN NACL 20-0.9 MG/50ML-% IV SOLN
INTRAVENOUS | Status: AC
  Filled 2018-11-16: qty 50

## 2018-11-16 MED ORDER — ACETAMINOPHEN 325 MG PO TABS
650.0000 mg | ORAL_TABLET | Freq: Once | ORAL | Status: AC
  Administered 2018-11-16: 650 mg via ORAL

## 2018-11-16 MED ORDER — HEPARIN SOD (PORK) LOCK FLUSH 100 UNIT/ML IV SOLN
500.0000 [IU] | Freq: Once | INTRAVENOUS | Status: AC | PRN
  Administered 2018-11-16: 500 [IU]
  Filled 2018-11-16: qty 5

## 2018-11-16 MED ORDER — HEPARIN SOD (PORK) LOCK FLUSH 100 UNIT/ML IV SOLN
250.0000 [IU] | INTRAVENOUS | Status: DC | PRN
  Filled 2018-11-16: qty 5

## 2018-11-16 MED ORDER — FAMOTIDINE IN NACL 20-0.9 MG/50ML-% IV SOLN
20.0000 mg | Freq: Once | INTRAVENOUS | Status: AC
  Administered 2018-11-16: 20 mg via INTRAVENOUS

## 2018-11-16 MED ORDER — SODIUM CHLORIDE 0.9% FLUSH
3.0000 mL | INTRAVENOUS | Status: DC | PRN
  Filled 2018-11-16: qty 10

## 2018-11-16 MED ORDER — SODIUM CHLORIDE 0.9% FLUSH
10.0000 mL | INTRAVENOUS | Status: DC | PRN
  Administered 2018-11-16: 10 mL
  Filled 2018-11-16: qty 10

## 2018-11-16 MED ORDER — SODIUM CHLORIDE 0.9 % IV SOLN
Freq: Once | INTRAVENOUS | Status: AC
  Administered 2018-11-16: 10:00:00 via INTRAVENOUS
  Filled 2018-11-16: qty 5

## 2018-11-16 MED ORDER — PALONOSETRON HCL INJECTION 0.25 MG/5ML
0.2500 mg | Freq: Once | INTRAVENOUS | Status: AC
  Administered 2018-11-16: 0.25 mg via INTRAVENOUS

## 2018-11-16 MED ORDER — DIPHENHYDRAMINE HCL 25 MG PO CAPS: 25.0000 mg | ORAL_CAPSULE | Freq: Once | ORAL | Status: DC

## 2018-11-16 MED FILL — BIKTARVY 50-200-25 MG TABS: 50-200-25 | 30 days supply | Qty: 30 | Fill #2

## 2018-11-16 NOTE — Patient Instructions (Signed)
[place] Discharge Instructions for Patients Receiving Chemotherapy  Today you received the following chemotherapy agents taxol/carboplatin   To help prevent nausea and vomiting after your treatment, we encourage you to take your nausea medication as directed  If you develop nausea and vomiting that is not controlled by your nausea medication, call the clinic.   BELOW ARE SYMPTOMS THAT SHOULD BE REPORTED IMMEDIATELY:  *FEVER GREATER THAN 100.5 F  *CHILLS WITH OR WITHOUT FEVER  NAUSEA AND VOMITING THAT IS NOT CONTROLLED WITH YOUR NAUSEA MEDICATION  *UNUSUAL SHORTNESS OF BREATH  *UNUSUAL BRUISING OR BLEEDING  TENDERNESS IN MOUTH AND THROAT WITH OR WITHOUT PRESENCE OF ULCERS  *URINARY PROBLEMS  *BOWEL PROBLEMS  UNUSUAL RASH Items with * indicate a potential emergency and should be followed up as soon as possible.  Feel free to call the clinic you have any questions or concerns. The clinic phone number is [phone].   Blood Transfusion, Adult A blood transfusion is a procedure in which you are given blood through an IV tube. You may need this procedure because of:  Illness.  Surgery.  Injury. The blood may come from someone else (a donor). You may also be able to donate blood for yourself (autologous blood donation). The blood given in a transfusion is made up of different types of cells. You may get:  Red blood cells. These carry oxygen to the cells in the body.  White blood cells. These help you fight infections.  Platelets. These help your blood to clot.  Plasma. This is the liquid part of your blood. It helps with fluid imbalances. If you have a clotting disorder, you may also get other types of blood products. What happens before the procedure?  You will have a blood test to find out your blood type. The test also finds out what type of blood your body will accept and matches it to the donor type.  If you are going to have a planned  surgery, you may be able to donate your own blood. This may be done in case you need a transfusion.  If you have had an allergic reaction to a transfusion in the past, you may be given medicine to help prevent a reaction. This medicine may be given to you by mouth or through an IV.  You will have your temperature, blood pressure, and pulse checked.  Follow instructions from your doctor about what you cannot eat or drink.  Ask your doctor about: ? Changing or stopping your regular medicines. This is important if you take diabetes medicines or blood thinners. ? Taking medicines such as aspirin and ibuprofen. These medicines can thin your blood. Do not take these medicines before your procedure if your doctor tells you not to. What happens during the procedure?  An IV tube will be put into one of your veins.  The bag of donated blood will be attached to your IV tube. Then, the blood will enter through your vein.  Your temperature, blood pressure, and pulse will be checked regularly during the procedure. This is done to find early signs of a transfusion reaction.  If you have any signs or symptoms of a reaction, your transfusion will be stopped. You may also be given medicine.  When the transfusion is done, your IV tube will be taken out.  Pressure may be applied to the IV site for a few minutes.  A bandage (dressing) will be put on the IV site. The procedure may vary among  doctors and hospitals. What happens after the procedure?  Your temperature, blood pressure, heart rate, breathing rate, and blood oxygen level will be checked often.  Your blood may be tested to see how you are responding to the transfusion.  You may be warmed with fluids or blankets. This is done to keep the temperature of your body normal. Summary  A blood transfusion is a procedure in which you are given blood through an IV tube.  The blood may come from someone else (a donor). You may also be able to donate  blood for yourself.  If you have had an allergic reaction to a transfusion in the past, you may be given medicine to help prevent a reaction. This medicine may be given to you by mouth or through an IV tube.  Your temperature, blood pressure, heart rate, breathing rate, and blood oxygen level will be checked often.  Your blood may be tested to see how you are responding to the transfusion. This information is not intended to replace advice given to you by your health care provider. Make sure you discuss any questions you have with your health care provider. Document Released: 01/20/2009 Document Revised: 06/17/2016 Document Reviewed: 06/17/2016 Elsevier Interactive Patient Education  [employer].

## 2018-11-19 LAB — BPAM RBC
Blood Product Expiration Date: 202002062359
Blood Product Expiration Date: 202002062359
Blood Product Expiration Date: 202002072359
ISSUE DATE / TIME: 202001091400
ISSUE DATE / TIME: 202001091400
ISSUE DATE / TIME: 202001101428
Unit Type and Rh: 9500
Unit Type and Rh: 9500
Unit Type and Rh: 9500

## 2018-11-19 LAB — TYPE AND SCREEN
ABO/RH(D): O NEG
Antibody Screen: NEGATIVE
Unit division: 0
Unit division: 0
Unit division: 0

## 2018-11-21 ENCOUNTER — Telehealth: Payer: Self-pay | Admitting: *Deleted

## 2018-11-21 ENCOUNTER — Telehealth: Payer: Self-pay

## 2018-11-21 NOTE — Telephone Encounter (Signed)
She called and left a message to call her. She needs more antibiotics.  Called back to clarify. She will take her last Augmentin tonight. Does she need more antibiotics?   Told her I would call her back tomorrow with answer. She verbalized understanding.

## 2018-11-21 NOTE — Telephone Encounter (Signed)
"  [name] [phone]).  I am out of antibiotic, if someone will call me."

## 2018-11-22 NOTE — Telephone Encounter (Signed)
Called and given below message. She verbalized understanding. 

## 2018-11-22 NOTE — Telephone Encounter (Signed)
She does not need more antibiotics

## 2018-11-23 ENCOUNTER — Ambulatory Visit (HOSPITAL_COMMUNITY)
Admission: RE | Admit: 2018-11-23 | Discharge: 2018-11-23 | Disposition: A | Discharge status: Home / Self Care | Payer: BLUE CROSS/BLUE SHIELD | Source: Ambulatory Visit | Attending: Medical | Admitting: Medical

## 2018-11-23 ENCOUNTER — Other Ambulatory Visit: Payer: Self-pay | Admitting: Hematology and Oncology

## 2018-11-23 ENCOUNTER — Telehealth: Payer: Self-pay

## 2018-11-23 ENCOUNTER — Other Ambulatory Visit: Payer: Self-pay | Admitting: *Deleted

## 2018-11-23 ENCOUNTER — Inpatient Hospital Stay (HOSPITAL_BASED_OUTPATIENT_CLINIC_OR_DEPARTMENT_OTHER): Payer: BLUE CROSS/BLUE SHIELD | Admitting: Medical

## 2018-11-23 VITALS — BP 112/79 | HR 121 | Temp 98.6°F | Resp 18 | Ht 61.0 in | Wt 179.7 lb

## 2018-11-23 DIAGNOSIS — R600 Localized edema: Secondary | ICD-10-CM

## 2018-11-23 DIAGNOSIS — C7989 Secondary malignant neoplasm of other specified sites: Secondary | ICD-10-CM | POA: Diagnosis not present

## 2018-11-23 DIAGNOSIS — C530 Malignant neoplasm of endocervix: Secondary | ICD-10-CM | POA: Diagnosis not present

## 2018-11-23 DIAGNOSIS — C539 Malignant neoplasm of cervix uteri, unspecified: Secondary | ICD-10-CM

## 2018-11-23 MED ORDER — FUROSEMIDE 20 MG PO TABS: ORAL_TABLET | ORAL | 0 refills | Status: DC

## 2018-11-23 MED ORDER — MORPHINE SULFATE 30 MG PO TABS: 30.0000 mg | ORAL_TABLET | Freq: Four times a day (QID) | ORAL | 0 refills | Status: DC | PRN

## 2018-11-23 MED ORDER — MORPHINE SULFATE ER 60 MG PO TBCR: 60.0000 mg | EXTENDED_RELEASE_TABLET | Freq: Two times a day (BID) | ORAL | 0 refills | Status: DC

## 2018-11-23 MED FILL — MORPHINE SULF 60 MG TAB SA: 60 | 30 days supply | Qty: 60 | Fill #0

## 2018-11-23 MED FILL — MORPHINE SULFATE IR 30 MG T: 30 | 22 days supply | Qty: 90 | Fill #0

## 2018-11-23 MED FILL — FUROSEMIDE 20 MG TABS: 20 | 30 days supply | Qty: 30 | Fill #0

## 2018-11-23 NOTE — Progress Notes (Signed)
These results were called to [name] and were reviewed with her . Her were answered. She expressed understanding.

## 2018-11-23 NOTE — Telephone Encounter (Signed)
She called and left a message to call her.  Called back. She is having swelling in her right leg. The swelling started in her foot and is now her whole leg. She has open sores on her leg that started recently. Scheduled appt with Sandi Mealy, PA for 0930 today. She verbalized understanding to appt.

## 2018-11-23 NOTE — Progress Notes (Signed)
Pt seen by PA Lucianne Lei only, no RN assessment at this time.  PA aware.  Pt sent to [facility] for [name] of right lower extremity.

## 2018-11-23 NOTE — Telephone Encounter (Signed)
done

## 2018-11-23 NOTE — Progress Notes (Signed)
RLE venous duplex       has been completed. Preliminary results can be found under CV proc through chart review. June Leap, BS, RDMS, RVT    Called result to Sandi Mealy

## 2018-11-23 NOTE — Progress Notes (Signed)
[facility] Progress Note   Misty Thomas [id number] 1966/11/03 53 y.o.  Misty Thomas is managed by Dr. [name]  Actively treated with chemotherapy/immunotherapy/hormonal therapy: yes  Current Therapy: Carboplatin & Paclitaxel  Last Treated: 11/16/2018 (cycle 2 day 1)  Assessment: Plan:    Unilateral edema of lower extremity - Plan: VAS [name] LOWER EXTREMITY VENOUS (DVT), furosemide (LASIX) 20 MG tablet  Primary cervical cancer with metastasis - Stage IV   1) Metastatic cervical cancer: The patient is s/p cycle 2 day 1 of Carboplatin and Paclitaxel which was dosed on 11/16/18.  She is scheduled to see Dr. Alvy Bimler in follow up on 1/20/210.  2) Right lower extremity edema: The patient was sent for a doppler ultrasound which returned negative for a DVT.  She was given Lasix 20 mg and instructed to use this once daily for 3 days then stop for 3 days, repeating as needed.   Please see After Visit Summary for patient specific instructions.  Future Appointments  Date Time Provider [place]  11/26/2018 11:45 AM [facility] LAB 4 [facility] None  11/26/2018 12:00 PM [facility] [place] FLUSH [facility] None  11/26/2018 12:30 PM Gorsuch, Ni, MD [facility] None  11/26/2018  2:00 PM [facility] INFUSION [facility] None  12/06/2018 10:00 AM [facility] LAB 2 [facility] None  12/06/2018 10:15 AM [facility] [place] FLUSH [facility] None  12/06/2018 11:15 AM Gorsuch, Ni, MD [facility] None  12/06/2018 12:00 PM [facility] INFUSION [facility] None  12/07/2018  9:15 AM [facility] INFUSION [facility] None  12/18/2018  9:30 AM [facility]  01/01/2019  9:45 AM Comer, Okey Regal, MD [facility]    No orders of the defined types were placed in this encounter.      Subjective:   Patient ID:  Misty Thomas is a 53 y.o. (DOB Apr 10, 1966) female.  Chief Complaint: No chief complaint on file.   HPI Misty Thomas is a 53 y.o. female with metastatic cervical  cancer.  She is followed by Dr. Alvy Bimler.  She is s/p cycle 2 day 1 of Carboplatin and Paclitaxel dosed on 11/16/18.  She presents today with her husband.  She has noted progressive right lower extremity edema since her treatment.  She has fatigue and SOB which is not markedly greater than her baseline.  She has wounds in her bilateral upper thighs due to rubbing of the skin.  She is currently a DNR but wishes to be a full code now.  Medications: I have reviewed the patient's current medications.  Allergies: No Known Allergies  Past Medical History:  Diagnosis Date  . Acquired pancytopenia ([place]) 03/2018  . Anemia    Mild  . Cancer associated pain   . Cervical cancer [facility]) oncologist-  dr gorsuch/  dr Sondra Come   dx 01-04-2017-- Stage IIIB,  cercial adenocarcinoma w/ local invasion perirectal area-- chemo started 02-09-2018 and external beam radiation completed 03/16/2018 ,  started brachytherapy boost high dose radiation 03-20-2018  . Chemotherapy induced nausea and vomiting   . Diabetes mellitus type 2, diet-controlled ([place])    pt. denies No meds  . Frequency of urination   . History of cellulitis 2018   bilateral lower leg w/ mrsa  . History of external beam radiation therapy 02-05-2018  to 03-16-2018   cervical cancer  . History of MRSA infection 2018   w/ bilateral lower leg cellulitis  . HIV (human immunodeficiency virus infection) ([place])    asymptomatic  . Hypomagnesemia 03/2018   Severe---- takes oral  magnesium and IV magnesium as needed (cancer center)  . Intermittent diarrhea    due to radiation/ chemo  . Nocturia   . Port-A-Cath in place    Power port  . Radiation burn    LOWER ABD.  04-06-2018  per is healing  . Subclinical hypothyroidism    w/ thyroiditis , dx 01-22-2018 PET scan  . Thrombocytopenia ([place])   . Wears glasses     Past Surgical History:  Procedure Laterality Date  . CYSTOSCOPY     retrograde pyelogram right ureteral stent placement  . CYSTOSCOPY W/  URETERAL STENT PLACEMENT Bilateral 10/24/2018   Procedure: CYSTOSCOPY WITH BILATERAL RETROGRADE PYELOGRAM RIGHT Wyvonnia Dusky STENT PLACEMENT;  Surgeon: Ardis Hughs, MD;  Location: [facility];  Service: Urology;  Laterality: Bilateral;  . EUA/ CERVICAL BX  01-04-2018   dr Ihor Dow  [facility]  . IR FLUORO GUIDE PORT INSERTION RIGHT  02/01/2018  . IR NEPHROSTOMY PLACEMENT LEFT  11/04/2018  . IR NEPHROSTOMY PLACEMENT RIGHT  11/04/2018  . TANDEM RING INSERTION N/A 03/20/2018   Procedure: TANDEM RING INSERTION;  Surgeon: Gery Pray, MD;  Location[facility];  Service: Urology;  Laterality: N/A;  . TANDEM RING INSERTION N/A 03/26/2018   Procedure: TANDEM RING INSERTION;  Surgeon: Gery Pray, MD;  Location: [facility];  Service: Urology;  Laterality: N/A;  . TANDEM RING INSERTION N/A 04/04/2018   Procedure: TANDEM RING INSERTION;  Surgeon: Gery Pray, MD;  Location: [facility];  Service: Urology;  Laterality: N/A;  . TANDEM RING INSERTION N/A 04/12/2018   Procedure: TANDEM RING INSERTION;  Surgeon: Gery Pray, MD;  [facility];  Service: Urology;  Laterality: N/A;  . TANDEM RING INSERTION N/A 04/18/2018   Procedure: TANDEM RING INSERTION;  Surgeon: Gery Pray, MD;  Location: [facility];  Service: Urology;  Laterality: N/A;  . TUBAL LIGATION Bilateral 1990s    Family History  Problem Relation Age of Onset  . Cancer Maternal Grandmother        unknown ca    Social History   Socioeconomic History  . Marital status: Married    Spouse name: [name]  . Number of children: 3  . Years of education: Not on file  . Highest education level: Not on file  Occupational History  . Not on file  Social Needs  . Financial resource strain: Somewhat hard  . Food insecurity:    Worry: Sometimes true    Inability: Sometimes true  . Transportation needs:    Medical: No    Non-medical: No  Tobacco Use  .  Smoking status: Never Smoker  . Smokeless tobacco: Never Used  Substance and Sexual Activity  . Alcohol use: Not Currently    Frequency: Never  . Drug use: No  . Sexual activity: Not Currently    Birth control/protection: Surgical  Lifestyle  . Physical activity:    Days per week: 0 days    Minutes per session: 0 min  . Stress: Rather much  Relationships  . Social connections:    Talks on phone: Once a week    Gets together: Once a week    Attends religious service: Never    Active member of club or organization: No    Attends meetings of clubs or organizations: Not on file    Relationship status: Married  . Intimate partner violence:    Fear of current or ex partner: Not on file    Emotionally abused: Not  on file    Physically abused: Not on file    Forced sexual activity: Not on file  Other Topics Concern  . Not on file  Social History Narrative  . Not on file    Past Medical History, Surgical history, Social history, and Family history were reviewed and updated as appropriate.   Please see review of systems for further details on the patient's review from today.   Review of Systems:  Review of Systems  Constitutional: Positive for fatigue. Negative for chills, diaphoresis and fever.  HENT: Negative for trouble swallowing and voice change.   Respiratory: Positive for shortness of breath. Negative for cough, chest tightness and wheezing.   Cardiovascular: Positive for leg swelling. Negative for chest pain and palpitations.  Gastrointestinal: Negative for abdominal pain, constipation, diarrhea, nausea and vomiting.  Musculoskeletal: Negative for back pain and myalgias.  Neurological: Negative for dizziness, light-headedness and headaches.    Objective:   Physical Exam:  BP 112/79 (BP Location: Left Arm, Patient Position: Sitting)   Pulse (!) 121 Comment: Chantrell Apsey is aware  Temp 98.6 F (37 C) (Oral)   Resp 18   Ht 5\' 1"  (1.549 m)   Wt 179 lb 11.2 oz (81.5 kg)    LMP 02/03/2018   SpO2 100%   BMI 33.95 kg/m  ECOG: 1  Physical Exam Constitutional:      General: She is not in acute distress.    Appearance: She is not diaphoretic.  HENT:     Head: Normocephalic and atraumatic.  Cardiovascular:     Rate and Rhythm: Normal rate and regular rhythm.     Heart sounds: Normal heart sounds. No murmur. No friction rub. No gallop.   Pulmonary:     Effort: Pulmonary effort is normal. No respiratory distress.     Breath sounds: Normal breath sounds. No wheezing or rales.  Musculoskeletal:        General: Swelling present.     Right lower leg: Edema present.     Left lower leg: Edema present.     Comments: Right calf measures 45.5 cm at 12 cm distal to the inferior pole of the patella. Left calf measures 39 centimeters at 12 cm distal to the inferior pole of the patella.  Skin:    General: Skin is warm and dry.     Findings: No erythema or rash.  Neurological:     Mental Status: She is alert.     Lab Review:     Component Value Date/Time   NA 138 11/15/2018 1135   K 4.2 11/15/2018 1135   CL 109 11/15/2018 1135   CO2 16 (L) 11/15/2018 1135   GLUCOSE 103 (H) 11/15/2018 1135   BUN 25 (H) 11/15/2018 1135   CREATININE 2.62 (H) 11/15/2018 1135   CREATININE 1.30 (H) 06/19/2018 1155   CALCIUM 9.0 11/15/2018 1135   PROT 7.5 11/15/2018 1135   ALBUMIN 2.8 (L) 11/15/2018 1135   AST 10 (L) 11/15/2018 1135   ALT 6 11/15/2018 1135   ALKPHOS 69 11/15/2018 1135   BILITOT 0.3 11/15/2018 1135   GFRNONAA 20 (L) 11/15/2018 1135   GFRNONAA 47 (L) 06/19/2018 1155   GFRAA 23 (L) 11/15/2018 1135   GFRAA 55 (L) 06/19/2018 1155       Component Value Date/Time   WBC 11.0 (H) 11/15/2018 1135   RBC 2.30 (L) 11/15/2018 1135   HGB 6.7 (LL) 11/15/2018 1135   HCT 21.4 (L) 11/15/2018 1135   PLT 389 11/15/2018 1135  MCV 93.0 11/15/2018 1135   MCH 29.1 11/15/2018 1135   MCHC 31.3 11/15/2018 1135   RDW 16.2 (H) 11/15/2018 1135   LYMPHSABS 0.6 (L) 11/15/2018  1135   MONOABS 0.7 11/15/2018 1135   EOSABS 0.1 11/15/2018 1135   BASOSABS 0.0 11/15/2018 1135   -------------------------------  Imaging from last 24 hours (if applicable):  Radiology interpretation: Ct Abdomen Pelvis Wo Contrast  Result Date: 11/01/2018 CLINICAL DATA:  HIV, cervical cancer on chemotherapy. Lower back pain. Hydronephrosis with worsening renal function. EXAM: CT ABDOMEN AND PELVIS WITHOUT CONTRAST TECHNIQUE: Multidetector CT imaging of the abdomen and pelvis was performed following the standard protocol without IV contrast. COMPARISON:  10/26/2018. FINDINGS: Lower chest: Small left pleural effusion with collapse/consolidation in the left lower lobe, new from 10/26/2018. Trace right pleural fluid. Decreased attenuation of the intravascular compartment is indicative of anemia. Heart size normal. No pericardial effusion. Hepatobiliary: Liver and gallbladder are unremarkable. No biliary ductal dilatation. Pancreas: Negative. Spleen: Negative. Adrenals/Urinary Tract: Adrenal glands are unremarkable. Double-J right ureteral stent is seen with the proximal loop formed in the right renal pelvis and distal loop formed in the bladder. Mild to moderate residual right hydronephrosis, similar to 10/26/2018. Extensive left perinephric fluid, new from 10/26/2018. Moderate left hydronephrosis. Fluid density lesion in the left kidney measures 4.1 cm, as before. Bladder is grossly unremarkable. Stomach/Bowel: Stomach and small bowel are unremarkable. Appendix is not readily visualized. Stool is seen throughout the colon. Vascular/Lymphatic: Vascular structures are grossly unremarkable. No definite pathologically enlarged lymph nodes. Reproductive: Uterus is visualized. Assess in the patient's cervical cancer is limited without IV contrast. Other: Left perirectal/presacral fluid collection measures approximately 2.7 x 3.7 cm, stable. Associated presacral thickening. Musculoskeletal: Degenerative changes  in the spine. No worrisome lytic or sclerotic lesions. IMPRESSION: 1. Large left perinephric fluid collection is new from 10/26/2018 and most likely represents a urinoma related to hydronephrosis and forniceal rupture. 2. Mild to moderate right hydronephrosis with double-J ureteral stent in place. 3. Left perirectal/presacral fluid collection is grossly stable. Abscess is not excluded. 4. New small left pleural effusion. Associated collapse/consolidation in the left lower lobe is likely due to atelectasis. Pneumonia is not excluded. 5. Stool throughout the colon is indicative of constipation. Electronically Signed   By: Lorin Picket M.D.   On: 11/01/2018 16:38   Dg Abd 1 View  Result Date: 10/30/2018 CLINICAL DATA:  53 year old female with abdominal pain and distention for 1 day. Cervical cancer. Right double-J ureteral stent placed earlier this month for right side urologic obstruction. EXAM: ABDOMEN - 1 VIEW COMPARISON:  CT Abdomen and Pelvis 10/26/2018 and earlier. FINDINGS: Right double-J ureteral stent is stable in configuration. Numerous pelvic phleboliths redemonstrated. No nephrolithiasis on the recent CT. Non obstructed bowel gas pattern. Retained stool throughout the colon similar to the recent CT. No acute osseous abnormality identified. IMPRESSION: Stable appearance to the scout view of the recent CT Abdomen and Pelvis. Stable right double-J ureteral stent. Electronically Signed   By: Genevie Ann M.D.   On: 10/30/2018 12:42   Ct Head Wo Contrast  Result Date: 10/26/2018 CLINICAL DATA:  Altered consciousness. History of cervical cancer. EXAM: CT HEAD WITHOUT CONTRAST TECHNIQUE: Contiguous axial images were obtained from the base of the skull through the vertex without intravenous contrast. COMPARISON:  06/19/2013 FINDINGS: Brain: No mass lesion, hemorrhage, hydrocephalus, acute infarct, intra-axial, or extra-axial fluid collection. Vascular: No hyperdense vessel or unexpected calcification.  Skull: Normal Sinuses/Orbits: Normal imaged portions of the orbits and globes. Clear paranasal  sinuses and mastoid air cells. Other: None. IMPRESSION: Normal head CT. Electronically Signed   By: Abigail Miyamoto M.D.   On: 10/26/2018 23:54   Ct Abdomen Pelvis W Contrast  Result Date: 10/26/2018 CLINICAL DATA:  Low back pain radiating to the back and left flank. Recent right ureteral stent placement. EXAM: CT ABDOMEN AND PELVIS WITH CONTRAST TECHNIQUE: Multidetector CT imaging of the abdomen and pelvis was performed using the standard protocol following bolus administration of intravenous contrast. CONTRAST:  38mL ISOVUE-300 IOPAMIDOL (ISOVUE-300) INJECTION 61% COMPARISON:  C-arm radiographs dated 10/24/2018. Abdomen and pelvis CT dated 01/03/2018. FINDINGS: Lower chest: Single small right lower lobe bullous. Hepatobiliary: Mild intrahepatic biliary ductal dilatation. Borderline dilated common duct. No obstructing stone or mass visualized. Normal appearing gallbladder. Focal fat deposition in the medial segment of the left lobe of the liver adjacent to the falciform ligament. Pancreas: Unremarkable. No pancreatic ductal dilatation or surrounding inflammatory changes. Spleen: Normal in size without focal abnormality. Adrenals/Urinary Tract: Right ureteral stent in place. The distal portion is just beyond the ureterovesical junction in the posterior aspect of the urinary bladder on the right. The proximal portion is on the upper pole collecting system. Interval moderate dilatation of the right renal collecting system and proximal ureter. No visible ureteral calculi. Large number of bilateral pelvic phleboliths. Previously demonstrated left renal cyst. Interval mild-to-moderate dilatation of the left renal collecting system without ureteral dilatation. No left ureteral calculi seen. Small amount of air in the urinary bladder. Stomach/Bowel: Stomach is within normal limits. Appendix appears normal. No evidence of bowel  wall thickening, distention, or inflammatory changes. Vascular/Lymphatic: No significant vascular findings are present. No enlarged abdominal or pelvic lymph nodes. Reproductive: Uterus and bilateral adnexa are unremarkable. Other: A recurrent or residual fluid and gas collection with thin surrounding enhancing walls cysts demonstrated in the left pararectal region. On image number 64 series 2, this measures 3.9 x 2.5 cm. On sagittal image number 74, this measures 9.4 cm in length and 4.2 cm in AP diameter, including the presacral region. On coronal image number 76, this measures 2.8 cm in width. It is difficult to distinguish the rectum separate from this collection. Musculoskeletal: Lumbar and lower thoracic spine degenerative changes. IMPRESSION: 1. 9.4 x 4.2 x 2.8 cm recurrent or residual fluid and gas collection in the left perirectal and presacral region, as described above. This has features highly suspicious for a perirectal and presacral abscess. 2. Right ureteral stent in place with interval moderate right hydronephrosis and proximal hydroureter. 3. Interval mild-to-moderate left hydronephrosis without ureteral dilatation compatible interval UPJ obstruction. 4. Mild intrahepatic biliary ductal dilatation. This could be due to a nonvisualized common duct stone or stricture. Electronically Signed   By: Claudie Revering M.D.   On: 10/26/2018 19:07   US Renal  Result Date: 11/01/2018 CLINICAL DATA:  Hydronephrosis EXAM: RENAL / URINARY TRACT ULTRASOUND COMPLETE COMPARISON:  KUB 10/30/2018.  CT abdomen pelvis 10/26/2018 FINDINGS: Right Kidney: Renal measurements: 11.5 x 6.3 x 5.9 cm = volume: 224 mL. Moderate right hydronephrosis. Right ureteral stent extends into the renal pelvis. No mass or calculi. Minimal perinephric fluid on the right. Left Kidney: Renal measurements: 12.6 x 6.2 x 6.4 cm = volume: 263 mL. Mild left hydronephrosis. 4 cm midpole cyst. Large perinephric fluid collection with septations. This  was not present on the recent CT. Bladder: Normal bladder.  Stent in the right bladder. IMPRESSION: Moderate hydronephrosis on the right with mild perinephric fluid on the right Mild left hydronephrosis with  large complex perinephric fluid collection not present on CT of 10/26/2018. Question hematoma or abscess. Electronically Signed   By: Franchot Gallo M.D.   On: 11/01/2018 07:47   Ir Nephrostomy Placement Left  Result Date: 11/04/2018 CLINICAL DATA:  Cervical carcinoma with low back pain, hydronephrosis, worsening renal function despite placement of right ureteral stent. CT also demonstrates left perinephric collection suggesting forniceal rupture. Percutaneous nephrostomy decompression requested. EXAM: BILATERAL PERCUTANEOUS NEPHROSTOMY CATHETER PLACEMENT UNDER ULTRASOUND AND FLUOROSCOPIC GUIDANCE FLUOROSCOPY TIME:  2.3 minute; 381 uGym2 DAP TECHNIQUE: The procedure, risks (including but not limited to bleeding, infection, organ damage ), benefits, and alternatives were explained to the patient. Questions regarding the procedure were encouraged and answered. The patient understands and consents to the procedure. Bilateralflank regions prepped with chlorhexidine, draped in usual sterile fashion, infiltrated locally with 1% lidocaine.No periprocedural antibiotics prophylaxis was indicated. Intravenous Fentanyl and Versed were administered as conscious sedation during continuous monitoring of the patient's level of consciousness and physiological / cardiorespiratory status by the radiology RN, with a total moderate sedation time of 20 minutes. Under real-time ultrasound guidance, a 21-gauge trocar needle was advanced into a posterior left lower pole calyx. Ultrasound image documentation was saved. Guidewire advanced easily into the renal collecting system and down the proximal ureter. Needle was exchanged over a guidewire for transitional dilator. Contrast injection confirmed appropriate positioning. Catheter  was exchanged over a guidewire for a 10 French pigtail catheter, formed centrally within the left renal collecting system. Contrast injection confirms appropriate positioning and patency. In similar fashion, on the right, Under real-time ultrasound guidance, a 21-gauge trocar needle was advanced into a posterior lower pole calyx. Ultrasound image documentation was saved. Urinary returned spontaneously through the needle hub. Guidewire advanced easily into the renal collecting system . Needle was exchanged over a guidewire for transitional dilator. Contrast injection confirmed appropriate positioning. Catheter was exchanged over a guidewire for a 10 French pigtail catheter, formed centrally within the right renal collecting system. Contrast injection confirms appropriate positioning and patency. Catheters secured externally with 0 Prolene suture and StatLock, and placed to external drain bags. The patient tolerated the procedure well. COMPLICATIONS: COMPLICATIONS none IMPRESSION: 1. Technically successful bilateral percutaneous nephrostomy catheter placement. Electronically Signed   By: Lucrezia Europe M.D.   On: 11/04/2018 15:13   Ir Nephrostomy Placement Right  Result Date: 11/04/2018 CLINICAL DATA:  Cervical carcinoma with low back pain, hydronephrosis, worsening renal function despite placement of right ureteral stent. CT also demonstrates left perinephric collection suggesting forniceal rupture. Percutaneous nephrostomy decompression requested. EXAM: BILATERAL PERCUTANEOUS NEPHROSTOMY CATHETER PLACEMENT UNDER ULTRASOUND AND FLUOROSCOPIC GUIDANCE FLUOROSCOPY TIME:  2.3 minute; 381 uGym2 DAP TECHNIQUE: The procedure, risks (including but not limited to bleeding, infection, organ damage ), benefits, and alternatives were explained to the patient. Questions regarding the procedure were encouraged and answered. The patient understands and consents to the procedure. Bilateralflank regions prepped with chlorhexidine,  draped in usual sterile fashion, infiltrated locally with 1% lidocaine.No periprocedural antibiotics prophylaxis was indicated. Intravenous Fentanyl and Versed were administered as conscious sedation during continuous monitoring of the patient's level of consciousness and physiological / cardiorespiratory status by the radiology RN, with a total moderate sedation time of 20 minutes. Under real-time ultrasound guidance, a 21-gauge trocar needle was advanced into a posterior left lower pole calyx. Ultrasound image documentation was saved. Guidewire advanced easily into the renal collecting system and down the proximal ureter. Needle was exchanged over a guidewire for transitional dilator. Contrast injection confirmed appropriate positioning. Catheter was exchanged  over a guidewire for a 10 French pigtail catheter, formed centrally within the left renal collecting system. Contrast injection confirms appropriate positioning and patency. In similar fashion, on the right, Under real-time ultrasound guidance, a 21-gauge trocar needle was advanced into a posterior lower pole calyx. Ultrasound image documentation was saved. Urinary returned spontaneously through the needle hub. Guidewire advanced easily into the renal collecting system . Needle was exchanged over a guidewire for transitional dilator. Contrast injection confirmed appropriate positioning. Catheter was exchanged over a guidewire for a 10 French pigtail catheter, formed centrally within the right renal collecting system. Contrast injection confirms appropriate positioning and patency. Catheters secured externally with 0 Prolene suture and StatLock, and placed to external drain bags. The patient tolerated the procedure well. COMPLICATIONS: COMPLICATIONS none IMPRESSION: 1. Technically successful bilateral percutaneous nephrostomy catheter placement. Electronically Signed   By: Lucrezia Europe M.D.   On: 11/04/2018 15:13   Vas [name] Lower Extremity Venous  (dvt)  Result Date: 11/23/2018  Lower Venous Study Indications: Edema.  Performing Technologist: June Leap RDMS, RVT  Examination Guidelines: A complete evaluation includes B-mode imaging, spectral Doppler, color Doppler, and power Doppler as needed of all accessible portions of each vessel. Bilateral testing is considered an integral part of a complete examination. Limited examinations for reoccurring indications may be performed as noted.  Right Venous Findings: +---------+---------------+---------+-----------+----------+-------+          CompressibilityPhasicitySpontaneityPropertiesSummary +---------+---------------+---------+-----------+----------+-------+ CFV      Full           Yes      Yes                          +---------+---------------+---------+-----------+----------+-------+ SFJ      Full                                                 +---------+---------------+---------+-----------+----------+-------+ FV Prox  Full                                                 +---------+---------------+---------+-----------+----------+-------+ FV Mid   Full                                                 +---------+---------------+---------+-----------+----------+-------+ FV DistalFull                                                 +---------+---------------+---------+-----------+----------+-------+ PFV      Full                                                 +---------+---------------+---------+-----------+----------+-------+ POP      Full           Yes      Yes                          +---------+---------------+---------+-----------+----------+-------+  PTV      Full                                                 +---------+---------------+---------+-----------+----------+-------+ PERO     Full                                                 +---------+---------------+---------+-----------+----------+-------+  Left Venous Findings:  +---+---------------+---------+-----------+----------+-------+    CompressibilityPhasicitySpontaneityPropertiesSummary +---+---------------+---------+-----------+----------+-------+ CFVFull           Yes      Yes                          +---+---------------+---------+-----------+----------+-------+    Summary: Right: There is no evidence of deep vein thrombosis in the lower extremity. No cystic structure found in the popliteal fossa. Left: No evidence of common femoral vein obstruction.  *See table(s) above for measurements and observations.    Preliminary         This case was discussed with Dr. Alvy Bimler. She expresses agreement with my management of this patient.

## 2018-11-23 NOTE — Patient Instructions (Signed)
Vascular Ultrasound A vascular ultrasound is a painless test that is done to see if you have blood flow problems or clots in your blood vessels. It uses harmless sound waves to take pictures of the arteries and veins in your body. The pictures are taken by passing a device (transducer) over certain areas of your body. Tell a health care provider about:  Any allergies you have.  All medicines you are taking, including vitamins, herbs, eye drops, creams, and over-the-counter medicines.  Any blood disorders you have.  Any surgeries you have had.  Any medical conditions you have.  Whether you are pregnant or may be pregnant. What are the risks? Generally, this is a safe procedure. There are no known risks or complications that arise from having an ultrasound. What happens before the procedure?  If the ultrasound scan involves your upper abdomen, you may be told not to eat or chew gum the morning of your exam. Follow your health care provider's instructions.  Do not smoke or use nicotine products at least 30 minutes before the exam.  During the test, a gel will be applied to your skin. What happens during the procedure?   A gel will be applied to your skin. It may feel cool.  The transducer will be placed on the area to be examined.  Pictures will be taken. They will be displayed on one or more monitors that look like small television screens. What happens after the procedure?  You can safely drive home immediately after your exam.  You may resume your normal diet and activities.  Keep all follow-up visits as told by your health care provider. This is important.  It is up to you to get your test results. Ask your health care provider, or the department that is doing the test: ? When will my results be ready? ? How will I get my results? ? What are my treatment options? ? What other tests do I need? ? What are my next steps? Summary  A vascular ultrasound is a painless test  that is done to see if you have blood flow problems or clots in your blood vessels. It uses harmless sound waves to take pictures of the arteries and veins in your body.  Generally, this is a safe procedure. There are no known risks or complications that arise from having an ultrasound.  A gel will be applied to your skin. It may feel cool. The device that takes the pictures (transducer) will then be placed on the area to be examined.  It is up to you to get your test results. Ask your health care provider or the department that is doing the test when your results will be ready and how you will get your results. This information is not intended to replace advice given to you by your health care provider. Make sure you discuss any questions you have with your health care provider. Document Released: 11/04/2004 Document Revised: 11/30/2017 Document Reviewed: 11/30/2017 Elsevier Interactive Patient Education  2019 [employer]  

## 2018-11-23 NOTE — Telephone Encounter (Signed)
She called and left a message. She needs a refill on both of her Morphine Rx's. She ask that they be sent to [facility] outpatient pharmacy.

## 2018-11-26 ENCOUNTER — Telehealth: Payer: Self-pay

## 2018-11-26 ENCOUNTER — Inpatient Hospital Stay: Payer: BLUE CROSS/BLUE SHIELD

## 2018-11-26 ENCOUNTER — Encounter: Payer: Self-pay | Admitting: Hematology and Oncology

## 2018-11-26 ENCOUNTER — Telehealth: Payer: Self-pay | Admitting: Hematology and Oncology

## 2018-11-26 ENCOUNTER — Inpatient Hospital Stay (HOSPITAL_BASED_OUTPATIENT_CLINIC_OR_DEPARTMENT_OTHER): Payer: BLUE CROSS/BLUE SHIELD | Admitting: Hematology and Oncology

## 2018-11-26 DIAGNOSIS — C7989 Secondary malignant neoplasm of other specified sites: Secondary | ICD-10-CM | POA: Diagnosis not present

## 2018-11-26 DIAGNOSIS — G893 Neoplasm related pain (acute) (chronic): Secondary | ICD-10-CM

## 2018-11-26 DIAGNOSIS — D61818 Other pancytopenia: Secondary | ICD-10-CM

## 2018-11-26 DIAGNOSIS — C530 Malignant neoplasm of endocervix: Secondary | ICD-10-CM

## 2018-11-26 DIAGNOSIS — R600 Localized edema: Secondary | ICD-10-CM

## 2018-11-26 DIAGNOSIS — N179 Acute kidney failure, unspecified: Secondary | ICD-10-CM

## 2018-11-26 DIAGNOSIS — C539 Malignant neoplasm of cervix uteri, unspecified: Secondary | ICD-10-CM

## 2018-11-26 LAB — COMPREHENSIVE METABOLIC PANEL
ALT: 15 U/L (ref 0–44)
AST: 13 U/L — ABNORMAL LOW (ref 15–41)
Albumin: 2.6 g/dL — ABNORMAL LOW (ref 3.5–5.0)
Alkaline Phosphatase: 108 U/L (ref 38–126)
Anion gap: 11 (ref 5–15)
BUN: 23 mg/dL — ABNORMAL HIGH (ref 6–20)
CO2: 21 mmol/L — ABNORMAL LOW (ref 22–32)
Calcium: 8.8 mg/dL — ABNORMAL LOW (ref 8.9–10.3)
Chloride: 102 mmol/L (ref 98–111)
Creatinine, Ser: 1.94 mg/dL — ABNORMAL HIGH (ref 0.44–1.00)
GFR calc Af Amer: 34 mL/min — ABNORMAL LOW (ref >60)
GFR calc non Af Amer: 29 mL/min — ABNORMAL LOW (ref >60)
Glucose, Bld: 128 mg/dL — ABNORMAL HIGH (ref 70–99)
Potassium: 3.8 mmol/L (ref 3.5–5.1)
Sodium: 134 mmol/L — ABNORMAL LOW (ref 135–145)
Total Bilirubin: 0.7 mg/dL (ref 0.3–1.2)
Total Protein: 7.4 g/dL (ref 6.5–8.1)

## 2018-11-26 LAB — CBC WITH DIFFERENTIAL/PLATELET
Abs Immature Granulocytes: 0 10*3/uL (ref 0.00–0.07)
Basophils Absolute: 0.1 10*3/uL (ref 0.0–0.1)
Basophils Relative: 2 %
EOS PCT: 1 %
Eosinophils Absolute: 0 10*3/uL (ref 0.0–0.5)
HEMATOCRIT: 29.8 % — AB (ref 36.0–46.0)
Hemoglobin: 9.9 g/dL — ABNORMAL LOW (ref 12.0–15.0)
Immature Granulocytes: 0 %
LYMPHS PCT: 17 %
Lymphs Abs: 0.5 10*3/uL — ABNORMAL LOW (ref 0.7–4.0)
MCH: 29.8 pg (ref 26.0–34.0)
MCHC: 33.2 g/dL (ref 30.0–36.0)
MCV: 89.8 fL (ref 80.0–100.0)
Monocytes Absolute: 0.3 10*3/uL (ref 0.1–1.0)
Monocytes Relative: 11 %
Neutro Abs: 2 10*3/uL (ref 1.7–7.7)
Neutrophils Relative %: 69 %
Platelets: 130 10*3/uL — ABNORMAL LOW (ref 150–400)
RBC: 3.32 MIL/uL — ABNORMAL LOW (ref 3.87–5.11)
RDW: 14.9 % (ref 11.5–15.5)
WBC: 2.9 10*3/uL — ABNORMAL LOW (ref 4.0–10.5)
nRBC: 0 % (ref 0.0–0.2)

## 2018-11-26 LAB — MAGNESIUM: MAGNESIUM: 1.3 mg/dL — AB (ref 1.7–2.4)

## 2018-11-26 LAB — SAMPLE TO BLOOD BANK

## 2018-11-26 NOTE — Telephone Encounter (Signed)
No los °

## 2018-11-26 NOTE — Assessment & Plan Note (Signed)
Her cancer pain is stable.  I have recently refilled her prescription pain medicine

## 2018-11-26 NOTE — Assessment & Plan Note (Addendum)
She has significant bilateral lower extremity edema.  Recent ultrasound venous Doppler excluded DVT I suspect the cause of this is from poor mobility, moderate protein calorie malnutrition, and fluid retention from recent steroid intake We discussed the importance of walking as tolerated and high-protein diet.

## 2018-11-26 NOTE — Assessment & Plan Note (Signed)
She has stable chronic renal failure.  She has good urine output through her nephrostomy tube.

## 2018-11-26 NOTE — Assessment & Plan Note (Signed)
She has mild pancytopenia due to recent treatment She has minimum bleeding through her vagina area She does not need blood transfusion support today

## 2018-11-26 NOTE — Assessment & Plan Note (Signed)
She tolerated recent chemotherapy well without major side effects Her blood counts are stable She does not need transfusion support today I will call advanced home care service to check on her at home and bring in supplies for her to have her percutaneous nephrostomy tube dressing changes

## 2018-11-26 NOTE — Assessment & Plan Note (Signed)
This is due to recent side effects of treatment and magnesium loss I recommend high-dose magnesium replacement therapy twice a day

## 2018-11-26 NOTE — Telephone Encounter (Signed)
Called and given below message. She verbalized understanding. She has a full bottle of the magnesium tablets and will start today.

## 2018-11-26 NOTE — Telephone Encounter (Signed)
Called regarding today's appt. She thought is was tomorrow. She is heading to the office now.

## 2018-11-26 NOTE — Telephone Encounter (Signed)
-----   Message from [name], MD sent at 11/26/2018  2:47 PM EST ----- Regarding: low mag Can you call and see if she has anymore leftover magnesium oxide at home? If not, call in magnesium oxide 400 mg BID PO, 1 month supply, 1 refills ----- Message ----- From: Interface[facility] [place] Sent: 11/26/2018   2:07 PM EST To: [name], MD

## 2018-11-26 NOTE — Progress Notes (Signed)
These results were called to [name] and were reviewed with her . Her were answered. She expressed understanding.

## 2018-11-26 NOTE — Progress Notes (Signed)
[facility] OFFICE PROGRESS NOTE  Patient Care Team: Patient, No Pcp Per as PCP - General (General Practice)  ASSESSMENT & PLAN:  Primary cervical cancer with metastasis - Stage IV She tolerated recent chemotherapy well without major side effects Her blood counts are stable She does not need transfusion support today I will call advanced home care service to check on her at home and bring in supplies for her to have her percutaneous nephrostomy tube dressing changes  Pancytopenia, acquired ([place]) She has mild pancytopenia due to recent treatment She has minimum bleeding through her vagina area She does not need blood transfusion support today  Cancer associated pain Her cancer pain is stable.  I have recently refilled her prescription pain medicine  AKI (acute kidney injury) ([place]) She has stable chronic renal failure.  She has good urine output through her nephrostomy tube.  Hypomagnesemia This is due to recent side effects of treatment and magnesium loss I recommend high-dose magnesium replacement therapy twice a day  Bilateral leg edema She has significant bilateral lower extremity edema.  Recent ultrasound venous Doppler excluded DVT I suspect the cause of this is from poor mobility, moderate protein calorie malnutrition, and fluid retention from recent steroid intake We discussed the importance of walking as tolerated and high-protein diet.   No orders of the defined types were placed in this encounter.   INTERVAL HISTORY: Please see below for problem oriented charting. She is here accompanied by her husband.  She is more than 2 hours late because she forgot about her appointment She was seen last week for significant lower extremity edema.  It is limiting her mobility. Her oral intake has been poor and she is not taking in high-protein diet Her pelvic pain is stable with current prescription pain medicine.  She has scant vaginal bleeding Her daughter and  husband has been doing dressing changes for her on her back.  They are running out of supplies She denies nausea or vomiting.  No constipation Denies recent fever or chills.  No peripheral neuropathy from treatment  SUMMARY OF ONCOLOGIC HISTORY: Oncology History   PD-L 1 40%     Primary cervical cancer with metastasis - Stage IV   01/04/2017 Initial Diagnosis    She went to urgent care service for evaluation of abnormal vaginal symptoms with discharge and was diagnosed with bacterial vaginosis    01/03/2018 Imaging    1. Complex thick-walled fluid collection/abscess the lower pelvis with possible communication to the lower uterine/vagina. Clinical correlation and further evaluation with pelvic ultrasound recommended. CT with IV and delayed oral and rectal contrast may provide additional information. A smaller collection is also noted in the left posterior hemipelvis the left of the rectum. 2. No bowel obstruction or active inflammation.  Normal appendix. Please note the described 5.0 x 5.5 cm complex collection within the pelvis appears to be in the region of the cervix and may represent a necrotic mass/neoplasm. The described 3 x 3 cm low attenuating lesion to the left of the rectum may represent an abnormal lymph node.     01/04/2018 Pathology Results    Cervix, biopsy - POORLY DIFFERENTIATED CARCINOMA - SEE COMMENT Microscopic Comment The biopsy material consists of multiple fragments of tissue with infiltrative tumor and granulation tissue. By immunohistochemistry, the tumor is positive for cytokeratin 5/6, p63, p16 and vimentin (diffusely positive) but negative for ER. 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    01/04/2018 Imaging  Pelvic US 1. Heterogeneous area of irregularity with Doppler flow in the region of the cervix. A cervical mass is not excluded. Clinical correlation is recommended.  2. Rounded complex mass/collection  posterior to the lower uterus/vagina corresponding to the larger complex collection seen on the earlier CT. This may represent a necrotic or purulent collection. 3. Grossly unremarkable left ovary. Nonvisualization the right ovary.    01/04/2018 Procedure    She underwent examination under anesthesia Procedure: pt taken to the operating room where gen anesthesia was performed. She was placed in the dorsal lithotomy position and prepped and draped in the usual sterile fashion. A bivalve speculum was placed in the pts vagina and a macerated cervix was noted. There was very little that resembled a normal cervix. The tissue was necrotic and malodorous.  Several biopsies were obtained. There was actually very little bleeding from the biopsy sites although there was bleeding from within the uterine cavity above the location of the cervix.       01/22/2018 PET scan    1. Large hypermetabolic cervical mass with local invasion into the perirectal space. Bilateral pelvic adenopathy noted with small but hypermetabolic pelvic lymph nodes compatible with malignancy. I do not see definite hypermetabolic adenopathy in the upper abdomen, but there are scattered small bilateral hypermetabolic lymph nodes in the neck and axillary regions of uncertain significance. Given the low level activity and the skipped region in the abdomen, it is possible that the neck and chest lymph nodes are simply reactive, as usually I would expect to see more upper abdominal adenopathy were there involvement in the neck and chest. Surveillance is probably warranted. 2. Bilateral mild diffuse thyroid activity suggesting thyroiditis. 3. Other imaging findings of potential clinical significance: Aortic Atherosclerosis (ICD10-I70.0). Mild cardiomegaly. Bilateral iliac artery atherosclerosis. Suspected uterine fundal fibroid.     01/25/2018 Cancer Staging    Staging form: Cervix Uteri, AJCC 8th Edition - Clinical: FIGO Stage IVA (cT4, cN1,  cM0) - Signed by [name], MD on 01/25/2018    02/01/2018 Procedure    Successful 8 French right internal jugular vein power port placement with its tip at the SVC/RA junction.    02/05/2018 - 04/18/2018 Radiation Therapy    Radiation treatment dates:   External therapy: 02/05/2018-03/16/2018, Brachytherapy: 03/20/2018, 03/26/2018, 04/04/2018, 04/12/2018, 04/18/2018  Site/dose:   1. Cervix/pelvis, 1.8 Gy in 25 fractions for a total dose of 45 Gy                      2. Sidewall/ parametrial Boost, 1.8 Gy in 5 fractions for a total dose of 9 Gy                      3. cervix, 5.5 Gy/fx,  5 fractions for a total dose of 27.5 Gy  Beams/energy:   1. 3D, 15X                              2. 3D, 15X                              3. HDR, Ir-192, tandem ring system     02/09/2018 - 03/09/2018 Chemotherapy    The patient had weekly cisplatin    07/12/2018 PET scan    Significant response to therapy with decreased hypermetabolic cervical soft tissue mass, left perirectal/presacral soft tissue density,  and bilateral iliac lymphadenopathy. No new or progressive metastatic disease identified.  Increased mild right hydronephrosis noted.  Stable thyroiditis, hepatic steatosis, and small uterine fibroid    10/09/2018 PET scan    1. Degradation secondary to patient body habitus. 2. Disease progression, as evidenced by increase in hypermetabolism within the cervix and a perirectal area of soft tissue density and gas. Progressive left common iliac and new retroperitoneal abdominal hypermetabolism, suspicious for nodal metastasis. 3. Gas within the cervical region could be related to necrosis from radiation therapy. Fistulous communication to bowel could look similar. 4. Similar to mild increase in moderate right-sided hydronephrosis. 5. Thyroid hypermetabolism, again suggesting thyroiditis.    10/24/2018 Surgery    Procedure: 1. Cystoscopy, bilateral retrograde pyelogram with interpretation 2. Right ureteral stent  placement   Surgeon: Ardis Hughs, MD  Intraoperative findings: #1:  Cystoscopy demonstrated some bullous edema within the bladder, but no discrete or obvious mucosal lesions.  The ureters were orthotopic. #2: Left retrograde pyelogram demonstrated some slight narrowing within the distal ureter, but there was no significant hydroureteronephrosis. #3: The right retrograde pyelogram demonstrated a dense 3 cm narrow stricture of the distal ureter and UVJ.  There was also a narrowing in the mid ureter just above the pelvic inlet that was approximately 2-1/2 cm long. #4: A 24 cm x 6 French Bard double-J inlay stent was placed in the patient's right ureter without complication. #5: An exam under anesthesia demonstrated a very hard and mobile and nodular lesion on the anterior rectal wall.     10/25/2018 -  Chemotherapy    The patient had carboplatin and Taxol    10/26/2018 Imaging    Ct abdomen and pelvis 1. 9.4 x 4.2 x 2.8 cm recurrent or residual fluid and gas collection in the left perirectal and presacral region, as described above. This has features highly suspicious for a perirectal and presacral abscess. 2. Right ureteral stent in place with interval moderate right hydronephrosis and proximal hydroureter. 3. Interval mild-to-moderate left hydronephrosis without ureteral dilatation compatible interval UPJ obstruction. 4. Mild intrahepatic biliary ductal dilatation. This could be due to a nonvisualized common duct stone or stricture.     10/26/2018 Imaging    Ct head: Normal head CT    10/26/2018 - 11/09/2018 Hospital Admission    She was admitted to the hospital due to uncontrolled pelvic pain.  CT imaging revealed possible abscess.  Due to acute renal failure, she had percutaneous stent placement    11/04/2018 Procedure    Technically successful bilateral percutaneous nephrostomy catheter placement.     REVIEW OF SYSTEMS:   Constitutional: Denies fevers, chills or  abnormal weight loss Eyes: Denies blurriness of vision Ears, nose, mouth, throat, and face: Denies mucositis or sore throat Respiratory: Denies cough, dyspnea or wheezes Cardiovascular: Denies palpitation, chest discomfort  Gastrointestinal:  Denies nausea, heartburn or change in bowel habits Skin: Denies abnormal skin rashes Lymphatics: Denies new lymphadenopathy or easy bruising Neurological:Denies numbness, tingling or new weaknesses Behavioral/Psych: Mood is stable, no new changes  All other systems were reviewed with the patient and are negative.  I have reviewed the past medical history, past surgical history, social history and family history with the patient and they are unchanged from previous note.  ALLERGIES:  has No Known Allergies.  MEDICATIONS:  Current Outpatient Medications  Medication Sig Dispense Refill  . BIKTARVY 50-200-25 MG TABS tablet TAKE 1 TABLET BY MOUTH DAILY. 30 tablet 5  . dexamethasone (DECADRON) 4 MG tablet Take  4 mg by mouth as directed. Take 5 pills the night before and 5 pills the morning of chemo, every 3 weeks    . furosemide (LASIX) 20 MG tablet 20 mg once daily for 3 days only as needed for leg swelling. Wait for 3 days before repeating. 30 tablet 0  . levothyroxine (SYNTHROID, LEVOTHROID) 25 MCG tablet Take 1 tablet (25 mcg total) by mouth daily before breakfast. 30 tablet 1  . lidocaine-prilocaine (EMLA) cream Apply 1 application topically as needed. 30 g 6  . LORazepam (ATIVAN) 0.5 MG tablet Take 1 tablet (0.5 mg total) by mouth every 6 (six) hours as needed for anxiety. 30 tablet 0  . magnesium oxide (MAG-OX) 400 (241.3 Mg) MG tablet Take 1 tablet (400 mg total) by mouth 2 (two) times daily. 60 tablet 3  . morphine (MS CONTIN) 60 MG 12 hr tablet Take 1 tablet (60 mg total) by mouth every 12 (twelve) hours. 60 tablet 0  . morphine (MSIR) 30 MG tablet Take 1 tablet (30 mg total) by mouth every 6 (six) hours as needed for severe pain. 90 tablet 0  .  ondansetron (ZOFRAN) 8 MG tablet Take 8 mg by mouth every 8 (eight) hours as needed.    . phenazopyridine (PYRIDIUM) 200 MG tablet Take 1 tablet (200 mg total) by mouth 3 (three) times daily as needed for pain. 10 tablet 0  . polyethylene glycol (MIRALAX / GLYCOLAX) packet Take 17 g by mouth daily.    . prochlorperazine (COMPAZINE) 10 MG tablet Take 10 mg by mouth every 6 (six) hours as needed.     No current facility-administered medications for this visit.     PHYSICAL EXAMINATION: ECOG PERFORMANCE STATUS: 2 - Symptomatic, <50% confined to bed  Vitals:   11/26/18 1410  BP: (!) 111/59  Pulse: (!) 134  Resp: 18  Temp: 99.2 F (37.3 C)  SpO2: 100%   Filed Weights   11/26/18 1410  Weight: 178 lb 9.6 oz (81 kg)    GENERAL:alert, no distress and comfortable SKIN: skin color, texture, turgor are normal, no rashes or significant lesions EYES: normal, Conjunctiva are pink and non-injected, sclera clear OROPHARYNX:no exudate, no erythema and lips, buccal mucosa, and tongue normal  NECK: supple, thyroid normal size, non-tender, without nodularity LYMPH:  no palpable lymphadenopathy in the cervical, axillary or inguinal LUNGS: clear to auscultation and percussion with normal breathing effort HEART: regular rate & rhythm and no murmurs moderate to severe bilateral lower extremity edema ABDOMEN:abdomen soft, non-tender and normal bowel sounds Musculoskeletal:no cyanosis of digits and no clubbing  NEURO: alert & oriented x 3 with fluent speech, no focal motor/sensory deficits  LABORATORY DATA:  I have reviewed the data as listed    Component Value Date/Time   NA 134 (L) 11/26/2018 1349   K 3.8 11/26/2018 1349   CL 102 11/26/2018 1349   CO2 21 (L) 11/26/2018 1349   GLUCOSE 128 (H) 11/26/2018 1349   BUN 23 (H) 11/26/2018 1349   CREATININE 1.94 (H) 11/26/2018 1349   CREATININE 1.30 (H) 06/19/2018 1155   CALCIUM 8.8 (L) 11/26/2018 1349   PROT 7.4 11/26/2018 1349   ALBUMIN 2.6 (L)  11/26/2018 1349   AST 13 (L) 11/26/2018 1349   ALT 15 11/26/2018 1349   ALKPHOS 108 11/26/2018 1349   BILITOT 0.7 11/26/2018 1349   GFRNONAA 29 (L) 11/26/2018 1349   GFRNONAA 47 (L) 06/19/2018 1155   GFRAA 34 (L) 11/26/2018 1349   GFRAA 55 (L) 06/19/2018 1155  No results found for: SPEP, UPEP  Lab Results  Component Value Date   WBC 2.9 (L) 11/26/2018   NEUTROABS 2.0 11/26/2018   HGB 9.9 (L) 11/26/2018   HCT 29.8 (L) 11/26/2018   MCV 89.8 11/26/2018   PLT 130 (L) 11/26/2018      Chemistry      Component Value Date/Time   NA 134 (L) 11/26/2018 1349   K 3.8 11/26/2018 1349   CL 102 11/26/2018 1349   CO2 21 (L) 11/26/2018 1349   BUN 23 (H) 11/26/2018 1349   CREATININE 1.94 (H) 11/26/2018 1349   CREATININE 1.30 (H) 06/19/2018 1155      Component Value Date/Time   CALCIUM 8.8 (L) 11/26/2018 1349   ALKPHOS 108 11/26/2018 1349   AST 13 (L) 11/26/2018 1349   ALT 15 11/26/2018 1349   BILITOT 0.7 11/26/2018 1349       RADIOGRAPHIC STUDIES: I have personally reviewed the radiological images as listed and agreed with the findings in the report. Ct Abdomen Pelvis Wo Contrast  Result Date: 11/01/2018 CLINICAL DATA:  HIV, cervical cancer on chemotherapy. Lower back pain. Hydronephrosis with worsening renal function. EXAM: CT ABDOMEN AND PELVIS WITHOUT CONTRAST TECHNIQUE: Multidetector CT imaging of the abdomen and pelvis was performed following the standard protocol without IV contrast. COMPARISON:  10/26/2018. FINDINGS: Lower chest: Small left pleural effusion with collapse/consolidation in the left lower lobe, new from 10/26/2018. Trace right pleural fluid. Decreased attenuation of the intravascular compartment is indicative of anemia. Heart size normal. No pericardial effusion. Hepatobiliary: Liver and gallbladder are unremarkable. No biliary ductal dilatation. Pancreas: Negative. Spleen: Negative. Adrenals/Urinary Tract: Adrenal glands are unremarkable. Double-J right ureteral  stent is seen with the proximal loop formed in the right renal pelvis and distal loop formed in the bladder. Mild to moderate residual right hydronephrosis, similar to 10/26/2018. Extensive left perinephric fluid, new from 10/26/2018. Moderate left hydronephrosis. Fluid density lesion in the left kidney measures 4.1 cm, as before. Bladder is grossly unremarkable. Stomach/Bowel: Stomach and small bowel are unremarkable. Appendix is not readily visualized. Stool is seen throughout the colon. Vascular/Lymphatic: Vascular structures are grossly unremarkable. No definite pathologically enlarged lymph nodes. Reproductive: Uterus is visualized. Assess in the patient's cervical cancer is limited without IV contrast. Other: Left perirectal/presacral fluid collection measures approximately 2.7 x 3.7 cm, stable. Associated presacral thickening. Musculoskeletal: Degenerative changes in the spine. No worrisome lytic or sclerotic lesions. IMPRESSION: 1. Large left perinephric fluid collection is new from 10/26/2018 and most likely represents a urinoma related to hydronephrosis and forniceal rupture. 2. Mild to moderate right hydronephrosis with double-J ureteral stent in place. 3. Left perirectal/presacral fluid collection is grossly stable. Abscess is not excluded. 4. New small left pleural effusion. Associated collapse/consolidation in the left lower lobe is likely due to atelectasis. Pneumonia is not excluded. 5. Stool throughout the colon is indicative of constipation. Electronically Signed   By: Lorin Picket M.D.   On: 11/01/2018 16:38   Dg Abd 1 View  Result Date: 10/30/2018 CLINICAL DATA:  53 year old female with abdominal pain and distention for 1 day. Cervical cancer. Right double-J ureteral stent placed earlier this month for right side urologic obstruction. EXAM: ABDOMEN - 1 VIEW COMPARISON:  CT Abdomen and Pelvis 10/26/2018 and earlier. FINDINGS: Right double-J ureteral stent is stable in configuration.  Numerous pelvic phleboliths redemonstrated. No nephrolithiasis on the recent CT. Non obstructed bowel gas pattern. Retained stool throughout the colon similar to the recent CT. No acute osseous abnormality identified. IMPRESSION: Stable  appearance to the scout view of the recent CT Abdomen and Pelvis. Stable right double-J ureteral stent. Electronically Signed   By: Genevie Ann M.D.   On: 10/30/2018 12:42   US Renal  Result Date: 11/01/2018 CLINICAL DATA:  Hydronephrosis EXAM: RENAL / URINARY TRACT ULTRASOUND COMPLETE COMPARISON:  KUB 10/30/2018.  CT abdomen pelvis 10/26/2018 FINDINGS: Right Kidney: Renal measurements: 11.5 x 6.3 x 5.9 cm = volume: 224 mL. Moderate right hydronephrosis. Right ureteral stent extends into the renal pelvis. No mass or calculi. Minimal perinephric fluid on the right. Left Kidney: Renal measurements: 12.6 x 6.2 x 6.4 cm = volume: 263 mL. Mild left hydronephrosis. 4 cm midpole cyst. Large perinephric fluid collection with septations. This was not present on the recent CT. Bladder: Normal bladder.  Stent in the right bladder. IMPRESSION: Moderate hydronephrosis on the right with mild perinephric fluid on the right Mild left hydronephrosis with large complex perinephric fluid collection not present on CT of 10/26/2018. Question hematoma or abscess. Electronically Signed   By: Franchot Gallo M.D.   On: 11/01/2018 07:47   Ir Nephrostomy Placement Left  Result Date: 11/04/2018 CLINICAL DATA:  Cervical carcinoma with low back pain, hydronephrosis, worsening renal function despite placement of right ureteral stent. CT also demonstrates left perinephric collection suggesting forniceal rupture. Percutaneous nephrostomy decompression requested. EXAM: BILATERAL PERCUTANEOUS NEPHROSTOMY CATHETER PLACEMENT UNDER ULTRASOUND AND FLUOROSCOPIC GUIDANCE FLUOROSCOPY TIME:  2.3 minute; 381 uGym2 DAP TECHNIQUE: The procedure, risks (including but not limited to bleeding, infection, organ damage ),  benefits, and alternatives were explained to the patient. Questions regarding the procedure were encouraged and answered. The patient understands and consents to the procedure. Bilateralflank regions prepped with chlorhexidine, draped in usual sterile fashion, infiltrated locally with 1% lidocaine.No periprocedural antibiotics prophylaxis was indicated. Intravenous Fentanyl and Versed were administered as conscious sedation during continuous monitoring of the patient's level of consciousness and physiological / cardiorespiratory status by the radiology RN, with a total moderate sedation time of 20 minutes. Under real-time ultrasound guidance, a 21-gauge trocar needle was advanced into a posterior left lower pole calyx. Ultrasound image documentation was saved. Guidewire advanced easily into the renal collecting system and down the proximal ureter. Needle was exchanged over a guidewire for transitional dilator. Contrast injection confirmed appropriate positioning. Catheter was exchanged over a guidewire for a 10 French pigtail catheter, formed centrally within the left renal collecting system. Contrast injection confirms appropriate positioning and patency. In similar fashion, on the right, Under real-time ultrasound guidance, a 21-gauge trocar needle was advanced into a posterior lower pole calyx. Ultrasound image documentation was saved. Urinary returned spontaneously through the needle hub. Guidewire advanced easily into the renal collecting system . Needle was exchanged over a guidewire for transitional dilator. Contrast injection confirmed appropriate positioning. Catheter was exchanged over a guidewire for a 10 French pigtail catheter, formed centrally within the right renal collecting system. Contrast injection confirms appropriate positioning and patency. Catheters secured externally with 0 Prolene suture and StatLock, and placed to external drain bags. The patient tolerated the procedure well. COMPLICATIONS:  COMPLICATIONS none IMPRESSION: 1. Technically successful bilateral percutaneous nephrostomy catheter placement. Electronically Signed   By: Lucrezia Europe M.D.   On: 11/04/2018 15:13   Ir Nephrostomy Placement Right  Result Date: 11/04/2018 CLINICAL DATA:  Cervical carcinoma with low back pain, hydronephrosis, worsening renal function despite placement of right ureteral stent. CT also demonstrates left perinephric collection suggesting forniceal rupture. Percutaneous nephrostomy decompression requested. EXAM: BILATERAL PERCUTANEOUS NEPHROSTOMY CATHETER PLACEMENT UNDER ULTRASOUND AND  FLUOROSCOPIC GUIDANCE FLUOROSCOPY TIME:  2.3 minute; 381 uGym2 DAP TECHNIQUE: The procedure, risks (including but not limited to bleeding, infection, organ damage ), benefits, and alternatives were explained to the patient. Questions regarding the procedure were encouraged and answered. The patient understands and consents to the procedure. Bilateralflank regions prepped with chlorhexidine, draped in usual sterile fashion, infiltrated locally with 1% lidocaine.No periprocedural antibiotics prophylaxis was indicated. Intravenous Fentanyl and Versed were administered as conscious sedation during continuous monitoring of the patient's level of consciousness and physiological / cardiorespiratory status by the radiology RN, with a total moderate sedation time of 20 minutes. Under real-time ultrasound guidance, a 21-gauge trocar needle was advanced into a posterior left lower pole calyx. Ultrasound image documentation was saved. Guidewire advanced easily into the renal collecting system and down the proximal ureter. Needle was exchanged over a guidewire for transitional dilator. Contrast injection confirmed appropriate positioning. Catheter was exchanged over a guidewire for a 10 French pigtail catheter, formed centrally within the left renal collecting system. Contrast injection confirms appropriate positioning and patency. In similar  fashion, on the right, Under real-time ultrasound guidance, a 21-gauge trocar needle was advanced into a posterior lower pole calyx. Ultrasound image documentation was saved. Urinary returned spontaneously through the needle hub. Guidewire advanced easily into the renal collecting system . Needle was exchanged over a guidewire for transitional dilator. Contrast injection confirmed appropriate positioning. Catheter was exchanged over a guidewire for a 10 French pigtail catheter, formed centrally within the right renal collecting system. Contrast injection confirms appropriate positioning and patency. Catheters secured externally with 0 Prolene suture and StatLock, and placed to external drain bags. The patient tolerated the procedure well. COMPLICATIONS: COMPLICATIONS none IMPRESSION: 1. Technically successful bilateral percutaneous nephrostomy catheter placement. Electronically Signed   By: Lucrezia Europe M.D.   On: 11/04/2018 15:13   Vas [name] Lower Extremity Venous (dvt)  Result Date: 11/25/2018  Lower Venous Study Indications: Edema.  Performing Technologist: June Leap RDMS, RVT  Examination Guidelines: A complete evaluation includes B-mode imaging, spectral Doppler, color Doppler, and power Doppler as needed of all accessible portions of each vessel. Bilateral testing is considered an integral part of a complete examination. Limited examinations for reoccurring indications may be performed as noted.  Right Venous Findings: +---------+---------------+---------+-----------+----------+-------+          CompressibilityPhasicitySpontaneityPropertiesSummary +---------+---------------+---------+-----------+----------+-------+ CFV      Full           Yes      Yes                          +---------+---------------+---------+-----------+----------+-------+ SFJ      Full                                                 +---------+---------------+---------+-----------+----------+-------+ FV Prox  Full                                                  +---------+---------------+---------+-----------+----------+-------+ FV Mid   Full                                                 +---------+---------------+---------+-----------+----------+-------+  FV DistalFull                                                 +---------+---------------+---------+-----------+----------+-------+ PFV      Full                                                 +---------+---------------+---------+-----------+----------+-------+ POP      Full           Yes      Yes                          +---------+---------------+---------+-----------+----------+-------+ PTV      Full                                                 +---------+---------------+---------+-----------+----------+-------+ PERO     Full                                                 +---------+---------------+---------+-----------+----------+-------+  Left Venous Findings: +---+---------------+---------+-----------+----------+-------+    CompressibilityPhasicitySpontaneityPropertiesSummary +---+---------------+---------+-----------+----------+-------+ CFVFull           Yes      Yes                          +---+---------------+---------+-----------+----------+-------+    Summary: Right: There is no evidence of deep vein thrombosis in the lower extremity. No cystic structure found in the popliteal fossa. Left: No evidence of common femoral vein obstruction.  *See table(s) above for measurements and observations. Electronically signed by Harold Barban MD on 11/25/2018 at 4:58:57 PM.    Final     All questions were answered. The patient knows to call the clinic with any problems, questions or concerns. No barriers to learning was detected.  I spent 25 minutes counseling the patient face to face. The total time spent in the appointment was 30 minutes and more than 50% was on counseling and review of test results  [name], MD 11/26/2018 3:04 PM

## 2018-12-06 ENCOUNTER — Inpatient Hospital Stay: Payer: BLUE CROSS/BLUE SHIELD

## 2018-12-06 ENCOUNTER — Telehealth: Payer: Self-pay | Admitting: Hematology and Oncology

## 2018-12-06 ENCOUNTER — Inpatient Hospital Stay (HOSPITAL_BASED_OUTPATIENT_CLINIC_OR_DEPARTMENT_OTHER): Payer: BLUE CROSS/BLUE SHIELD | Admitting: Hematology and Oncology

## 2018-12-06 ENCOUNTER — Encounter: Payer: Self-pay | Admitting: Hematology and Oncology

## 2018-12-06 VITALS — BP 106/58 | HR 95 | Resp 17

## 2018-12-06 VITALS — BP 95/52 | HR 118 | Temp 98.4°F | Resp 18 | Ht 61.0 in | Wt 170.2 lb

## 2018-12-06 DIAGNOSIS — C539 Malignant neoplasm of cervix uteri, unspecified: Secondary | ICD-10-CM

## 2018-12-06 DIAGNOSIS — R197 Diarrhea, unspecified: Secondary | ICD-10-CM

## 2018-12-06 DIAGNOSIS — G893 Neoplasm related pain (acute) (chronic): Secondary | ICD-10-CM | POA: Diagnosis not present

## 2018-12-06 DIAGNOSIS — N179 Acute kidney failure, unspecified: Secondary | ICD-10-CM | POA: Diagnosis not present

## 2018-12-06 DIAGNOSIS — D61818 Other pancytopenia: Secondary | ICD-10-CM

## 2018-12-06 DIAGNOSIS — Z21 Asymptomatic human immunodeficiency virus [HIV] infection status: Secondary | ICD-10-CM

## 2018-12-06 DIAGNOSIS — C530 Malignant neoplasm of endocervix: Secondary | ICD-10-CM

## 2018-12-06 LAB — CBC WITH DIFFERENTIAL/PLATELET
Abs Immature Granulocytes: 0.27 10*3/uL — ABNORMAL HIGH (ref 0.00–0.07)
BASOS PCT: 0 %
Basophils Absolute: 0 10*3/uL (ref 0.0–0.1)
Eosinophils Absolute: 0 10*3/uL (ref 0.0–0.5)
Eosinophils Relative: 0 %
HCT: 26.7 % — ABNORMAL LOW (ref 36.0–46.0)
Hemoglobin: 8.6 g/dL — ABNORMAL LOW (ref 12.0–15.0)
Immature Granulocytes: 2 %
Lymphocytes Relative: 5 %
Lymphs Abs: 0.7 10*3/uL (ref 0.7–4.0)
MCH: 29.9 pg (ref 26.0–34.0)
MCHC: 32.2 g/dL (ref 30.0–36.0)
MCV: 92.7 fL (ref 80.0–100.0)
MONO ABS: 0.6 10*3/uL (ref 0.1–1.0)
Monocytes Relative: 4 %
Neutro Abs: 13.4 10*3/uL — ABNORMAL HIGH (ref 1.7–7.7)
Neutrophils Relative %: 89 %
Platelets: 370 10*3/uL (ref 150–400)
RBC: 2.88 MIL/uL — ABNORMAL LOW (ref 3.87–5.11)
RDW: 15.3 % (ref 11.5–15.5)
WBC: 15 10*3/uL — ABNORMAL HIGH (ref 4.0–10.5)
nRBC: 0 % (ref 0.0–0.2)

## 2018-12-06 LAB — COMPREHENSIVE METABOLIC PANEL
ALT: 7 U/L (ref 0–44)
AST: 10 U/L — ABNORMAL LOW (ref 15–41)
Albumin: 2.6 g/dL — ABNORMAL LOW (ref 3.5–5.0)
Alkaline Phosphatase: 115 U/L (ref 38–126)
Anion gap: 13 (ref 5–15)
BUN: 24 mg/dL — ABNORMAL HIGH (ref 6–20)
CALCIUM: 9.2 mg/dL (ref 8.9–10.3)
CO2: 22 mmol/L (ref 22–32)
Chloride: 102 mmol/L (ref 98–111)
Creatinine, Ser: 1.55 mg/dL — ABNORMAL HIGH (ref 0.44–1.00)
GFR calc Af Amer: 44 mL/min — ABNORMAL LOW (ref >60)
GFR calc non Af Amer: 38 mL/min — ABNORMAL LOW (ref >60)
Glucose, Bld: 130 mg/dL — ABNORMAL HIGH (ref 70–99)
Potassium: 3.9 mmol/L (ref 3.5–5.1)
Sodium: 137 mmol/L (ref 135–145)
Total Bilirubin: 0.5 mg/dL (ref 0.3–1.2)
Total Protein: 7.7 g/dL (ref 6.5–8.1)

## 2018-12-06 LAB — MAGNESIUM: Magnesium: 1.8 mg/dL (ref 1.7–2.4)

## 2018-12-06 LAB — SAMPLE TO BLOOD BANK

## 2018-12-06 MED ORDER — SODIUM CHLORIDE 0.9 % IV SOLN
Freq: Once | INTRAVENOUS | Status: AC
  Administered 2018-12-06: 12:00:00 via INTRAVENOUS
  Filled 2018-12-06: qty 250

## 2018-12-06 MED ORDER — HEPARIN SOD (PORK) LOCK FLUSH 100 UNIT/ML IV SOLN
500.0000 [IU] | Freq: Once | INTRAVENOUS | Status: AC
  Administered 2018-12-06: 500 [IU] via INTRAVENOUS
  Filled 2018-12-06: qty 5

## 2018-12-06 MED ORDER — SODIUM CHLORIDE 0.9% FLUSH
10.0000 mL | Freq: Once | INTRAVENOUS | Status: AC
  Administered 2018-12-06: 10 mL via INTRAVENOUS
  Filled 2018-12-06: qty 10

## 2018-12-06 MED ORDER — ONDANSETRON HCL 8 MG PO TABS: 8.0000 mg | ORAL_TABLET | Freq: Three times a day (TID) | ORAL | 3 refills | Status: DC | PRN

## 2018-12-06 MED ORDER — SODIUM CHLORIDE 0.9% FLUSH
10.0000 mL | Freq: Once | INTRAVENOUS | Status: AC
  Administered 2018-12-06: 10 mL
  Filled 2018-12-06: qty 10

## 2018-12-06 MED FILL — ONDANSETRON HCL 8 MG TABLET: 8 | 20 days supply | Qty: 60 | Fill #0

## 2018-12-06 NOTE — Telephone Encounter (Signed)
Gave avs and calendar ° °

## 2018-12-06 NOTE — Assessment & Plan Note (Signed)
She will continue antiretroviral therapy She would be at risk of infection while on chemotherapy

## 2018-12-06 NOTE — Assessment & Plan Note (Signed)
She has intermittent renal failure She has stent placement She looks dehydrated I recommend IV fluid support today We will continue to monitor her kidney function carefully

## 2018-12-06 NOTE — Assessment & Plan Note (Signed)
With reduced dose chemotherapy, her blood counts are stable even though she has persistent anemia which I think is related to renal failure She does not need transfusion support today but I anticipate she might need blood next week.  If her hemoglobin dropped to less than 8, she will receive 1 unit of blood

## 2018-12-06 NOTE — Assessment & Plan Note (Addendum)
This is stable.  She will continue prescribed medications for pain.

## 2018-12-06 NOTE — Patient Instructions (Signed)
Dehydration, Adult  Dehydration is when there is not enough fluid or water in your body. This happens when you lose more fluids than you take in. Dehydration can range from mild to very bad. It should be treated right away to keep it from getting very bad. Symptoms of mild dehydration may include:  Thirst.  Dry lips.  Slightly dry mouth.  Dry, warm skin.  Dizziness. Symptoms of moderate dehydration may include:  Very dry mouth.  Muscle cramps.  Dark pee (urine). Pee may be the color of tea.  Your body making less pee.  Your eyes making fewer tears.  Heartbeat that is uneven or faster than normal (palpitations).  Headache.  Light-headedness, especially when you stand up from sitting.  Fainting (syncope). Symptoms of very bad dehydration may include:  Changes in skin, such as: ? Cold and clammy skin. ? Blotchy (mottled) or pale skin. ? Skin that does not quickly return to normal after being lightly pinched and let go (poor skin turgor).  Changes in body fluids, such as: ? Feeling very thirsty. ? Your eyes making fewer tears. ? Not sweating when body temperature is high, such as in hot weather. ? Your body making very little pee.  Changes in vital signs, such as: ? Weak pulse. ? Pulse that is more than 100 beats a minute when you are sitting still. ? Fast breathing. ? Low blood pressure.  Other changes, such as: ? Sunken eyes. ? Cold hands and feet. ? Confusion. ? Lack of energy (lethargy). ? Trouble waking up from sleep. ? Short-term weight loss. ? Unconsciousness. Follow these instructions at home:   If told by your doctor, drink an ORS: ? Make an ORS by using instructions on the package. ? Start by drinking small amounts, about  cup (120 mL) every 5-10 minutes. ? Slowly drink more until you have had the amount that your doctor said to have.  Drink enough clear fluid to keep your pee clear or pale yellow. If you were told to drink an ORS, finish the  ORS first, then start slowly drinking clear fluids. Drink fluids such as: ? Water. Do not drink only water by itself. Doing that can make the salt (sodium) level in your body get too low (hyponatremia). ? Ice chips. ? Fruit juice that you have added water to (diluted). ? Low-calorie sports drinks.  Avoid: ? Alcohol. ? Drinks that have a lot of sugar. These include high-calorie sports drinks, fruit juice that does not have water added, and soda. ? Caffeine. ? Foods that are greasy or have a lot of fat or sugar.  Take over-the-counter and prescription medicines only as told by your doctor.  Do not take salt tablets. Doing that can make the salt level in your body get too high (hypernatremia).  Eat foods that have minerals (electrolytes). Examples include bananas, oranges, potatoes, tomatoes, and spinach.  Keep all follow-up visits as told by your doctor. This is important. Contact a doctor if:  You have belly (abdominal) pain that: ? Gets worse. ? Stays in one area (localizes).  You have a rash.  You have a stiff neck.  You get angry or annoyed more easily than normal (irritability).  You are more sleepy than normal.  You have a harder time waking up than normal.  You feel: ? Weak. ? Dizzy. ? Very thirsty.  You have peed (urinated) only a small amount of very dark pee during 6-8 hours. Get help right away if:  You have   symptoms of very bad dehydration.  You cannot drink fluids without throwing up (vomiting).  Your symptoms get worse with treatment.  You have a fever.  You have a very bad headache.  You are throwing up or having watery poop (diarrhea) and it: ? Gets worse. ? Does not go away.  You have blood or something green (bile) in your throw-up.  You have blood in your poop (stool). This may cause poop to look black and tarry.  You have not peed in 6-8 hours.  You pass out (faint).  Your heart rate when you are sitting still is more than 100 beats a  minute.  You have trouble breathing. This information is not intended to replace advice given to you by your health care provider. Make sure you discuss any questions you have with your health care provider. Document Released: 08/20/2009 Document Revised: 05/13/2016 Document Reviewed: 12/18/2015 [employer] Interactive Patient Education  2019 [employer]  

## 2018-12-06 NOTE — Assessment & Plan Note (Signed)
She tolerated treatment poorly with major side effects including diarrhea and pancytopenia Renal function is stable We will proceed with reduced dose chemotherapy tomorrow and a plan to repeat imaging study before her next cycles of therapy

## 2018-12-06 NOTE — Progress Notes (Signed)
[facility] OFFICE PROGRESS NOTE  Patient Care Team: Patient, No Pcp Per as PCP - General (General Practice)  ASSESSMENT & PLAN:  Primary cervical cancer with metastasis - Stage IV She tolerated treatment poorly with major side effects including diarrhea and pancytopenia Renal function is stable We will proceed with reduced dose chemotherapy tomorrow and a plan to repeat imaging study before her next cycles of therapy  Cancer associated pain This is stable.  She will continue prescribed medications for pain.  Pancytopenia, acquired ([place]) With reduced dose chemotherapy, her blood counts are stable even though she has persistent anemia which I think is related to renal failure She does not need transfusion support today but I anticipate she might need blood next week.  If her hemoglobin dropped to less than 8, she will receive 1 unit of blood  AKI (acute kidney injury) ([place]) She has intermittent renal failure She has stent placement She looks dehydrated I recommend IV fluid support today We will continue to monitor her kidney function carefully  Diarrhea She has intermittent diarrhea causing dehydration I recommend her to take Imodium I recommend IV fluid resuscitation  HIV (human immunodeficiency virus infection) ([place]) She will continue antiretroviral therapy She would be at risk of infection while on chemotherapy   Orders Placed This Encounter  Procedures  . CT ABDOMEN PELVIS W CONTRAST    Standing Status:   Future    Standing Expiration Date:   12/07/2019    Order Specific Question:   If indicated for the ordered procedure, I authorize the administration of contrast media per Radiology protocol    Answer:   Yes    Order Specific Question:   Preferred imaging location?    Answer:   [place] [facility]    Order Specific Question:   Radiology Contrast Protocol - do NOT remove file path    Answer:   [email or web address]    Order  Specific Question:   Is patient pregnant?    Answer:   No    INTERVAL HISTORY: Please see below for problem oriented charting. She returns with her husband for further follow-up She feels weak She has frequent diarrhea Denies nausea.  Her cancer pain is stable on current prescription pain medicine She denies fever or chills The patient denies any recent signs or symptoms of bleeding such as spontaneous epistaxis, hematuria or hematochezia. Denies peripheral neuropathy from treatment  SUMMARY OF ONCOLOGIC HISTORY: Oncology History   PD-L 1 40%     Primary cervical cancer with metastasis - Stage IV   01/04/2017 Initial Diagnosis    She went to urgent care service for evaluation of abnormal vaginal symptoms with discharge and was diagnosed with bacterial vaginosis    01/03/2018 Imaging    1. Complex thick-walled fluid collection/abscess the lower pelvis with possible communication to the lower uterine/vagina. Clinical correlation and further evaluation with pelvic ultrasound recommended. CT with IV and delayed oral and rectal contrast may provide additional information. A smaller collection is also noted in the left posterior hemipelvis the left of the rectum. 2. No bowel obstruction or active inflammation.  Normal appendix. Please note the described 5.0 x 5.5 cm complex collection within the pelvis appears to be in the region of the cervix and may represent a necrotic mass/neoplasm. The described 3 x 3 cm low attenuating lesion to the left of the rectum may represent an abnormal lymph node.     01/04/2018 Pathology Results    Cervix, biopsy - POORLY DIFFERENTIATED CARCINOMA -  SEE COMMENT Microscopic Comment The biopsy material consists of multiple fragments of tissue with infiltrative tumor and granulation tissue. By immunohistochemistry, the tumor is positive for cytokeratin 5/6, p63, p16 and vimentin (diffusely positive) but negative for ER. 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    01/04/2018 Imaging    Pelvic US 1. Heterogeneous area of irregularity with Doppler flow in the region of the cervix. A cervical mass is not excluded. Clinical correlation is recommended.  2. Rounded complex mass/collection posterior to the lower uterus/vagina corresponding to the larger complex collection seen on the earlier CT. This may represent a necrotic or purulent collection. 3. Grossly unremarkable left ovary. Nonvisualization the right ovary.    01/04/2018 Procedure    She underwent examination under anesthesia Procedure: pt taken to the operating room where gen anesthesia was performed. She was placed in the dorsal lithotomy position and prepped and draped in the usual sterile fashion. A bivalve speculum was placed in the pts vagina and a macerated cervix was noted. There was very little that resembled a normal cervix. The tissue was necrotic and malodorous.  Several biopsies were obtained. There was actually very little bleeding from the biopsy sites although there was bleeding from within the uterine cavity above the location of the cervix.       01/22/2018 PET scan    1. Large hypermetabolic cervical mass with local invasion into the perirectal space. Bilateral pelvic adenopathy noted with small but hypermetabolic pelvic lymph nodes compatible with malignancy. I do not see definite hypermetabolic adenopathy in the upper abdomen, but there are scattered small bilateral hypermetabolic lymph nodes in the neck and axillary regions of uncertain significance. Given the low level activity and the skipped region in the abdomen, it is possible that the neck and chest lymph nodes are simply reactive, as usually I would expect to see more upper abdominal adenopathy were there involvement in the neck and chest. Surveillance is probably warranted. 2. Bilateral mild diffuse thyroid activity suggesting thyroiditis. 3. Other imaging findings of potential  clinical significance: Aortic Atherosclerosis (ICD10-I70.0). Mild cardiomegaly. Bilateral iliac artery atherosclerosis. Suspected uterine fundal fibroid.     01/25/2018 Cancer Staging    Staging form: Cervix Uteri, AJCC 8th Edition - Clinical: FIGO Stage IVA (cT4, cN1, cM0) - Signed by [name], MD on 01/25/2018    02/01/2018 Procedure    Successful 8 French right internal jugular vein power port placement with its tip at the SVC/RA junction.    02/05/2018 - 04/18/2018 Radiation Therapy    Radiation treatment dates:   External therapy: 02/05/2018-03/16/2018, Brachytherapy: 03/20/2018, 03/26/2018, 04/04/2018, 04/12/2018, 04/18/2018  Site/dose:   1. Cervix/pelvis, 1.8 Gy in 25 fractions for a total dose of 45 Gy                      2. Sidewall/ parametrial Boost, 1.8 Gy in 5 fractions for a total dose of 9 Gy                      3. cervix, 5.5 Gy/fx,  5 fractions for a total dose of 27.5 Gy  Beams/energy:   1. 3D, 15X                              2. 3D, 15X  3. HDR, Ir-192, tandem ring system     02/09/2018 - 03/09/2018 Chemotherapy    The patient had weekly cisplatin    07/12/2018 PET scan    Significant response to therapy with decreased hypermetabolic cervical soft tissue mass, left perirectal/presacral soft tissue density, and bilateral iliac lymphadenopathy. No new or progressive metastatic disease identified.  Increased mild right hydronephrosis noted.  Stable thyroiditis, hepatic steatosis, and small uterine fibroid    10/09/2018 PET scan    1. Degradation secondary to patient body habitus. 2. Disease progression, as evidenced by increase in hypermetabolism within the cervix and a perirectal area of soft tissue density and gas. Progressive left common iliac and new retroperitoneal abdominal hypermetabolism, suspicious for nodal metastasis. 3. Gas within the cervical region could be related to necrosis from radiation therapy. Fistulous communication to bowel could  look similar. 4. Similar to mild increase in moderate right-sided hydronephrosis. 5. Thyroid hypermetabolism, again suggesting thyroiditis.    10/24/2018 Surgery    Procedure: 1. Cystoscopy, bilateral retrograde pyelogram with interpretation 2. Right ureteral stent placement   Surgeon: Ardis Hughs, MD  Intraoperative findings: #1:  Cystoscopy demonstrated some bullous edema within the bladder, but no discrete or obvious mucosal lesions.  The ureters were orthotopic. #2: Left retrograde pyelogram demonstrated some slight narrowing within the distal ureter, but there was no significant hydroureteronephrosis. #3: The right retrograde pyelogram demonstrated a dense 3 cm narrow stricture of the distal ureter and UVJ.  There was also a narrowing in the mid ureter just above the pelvic inlet that was approximately 2-1/2 cm long. #4: A 24 cm x 6 French Bard double-J inlay stent was placed in the patient's right ureter without complication. #5: An exam under anesthesia demonstrated a very hard and mobile and nodular lesion on the anterior rectal wall.     10/25/2018 -  Chemotherapy    The patient had carboplatin and Taxol    10/26/2018 Imaging    Ct abdomen and pelvis 1. 9.4 x 4.2 x 2.8 cm recurrent or residual fluid and gas collection in the left perirectal and presacral region, as described above. This has features highly suspicious for a perirectal and presacral abscess. 2. Right ureteral stent in place with interval moderate right hydronephrosis and proximal hydroureter. 3. Interval mild-to-moderate left hydronephrosis without ureteral dilatation compatible interval UPJ obstruction. 4. Mild intrahepatic biliary ductal dilatation. This could be due to a nonvisualized common duct stone or stricture.     10/26/2018 Imaging    Ct head: Normal head CT    10/26/2018 - 11/09/2018 Hospital Admission    She was admitted to the hospital due to uncontrolled pelvic pain.  CT imaging  revealed possible abscess.  Due to acute renal failure, she had percutaneous stent placement    11/04/2018 Procedure    Technically successful bilateral percutaneous nephrostomy catheter placement.     REVIEW OF SYSTEMS:   Constitutional: Denies fevers, chills or abnormal weight loss Eyes: Denies blurriness of vision Ears, nose, mouth, throat, and face: Denies mucositis or sore throat Respiratory: Denies cough, dyspnea or wheezes Cardiovascular: Denies palpitation, chest discomfort or lower extremity swelling Skin: Denies abnormal skin rashes Lymphatics: Denies new lymphadenopathy or easy bruising Neurological:Denies numbness, tingling or new weaknesses Behavioral/Psych: Mood is stable, no new changes  All other systems were reviewed with the patient and are negative.  I have reviewed the past medical history, past surgical history, social history and family history with the patient and they are unchanged from previous note.  ALLERGIES:  has No Known Allergies.  MEDICATIONS:  Current Outpatient Medications  Medication Sig Dispense Refill  . BIKTARVY 50-200-25 MG TABS tablet TAKE 1 TABLET BY MOUTH DAILY. 30 tablet 5  . dexamethasone (DECADRON) 4 MG tablet Take 4 mg by mouth as directed. Take 5 pills the night before and 5 pills the morning of chemo, every 3 weeks    . levothyroxine (SYNTHROID, LEVOTHROID) 25 MCG tablet Take 1 tablet (25 mcg total) by mouth daily before breakfast. 30 tablet 1  . lidocaine-prilocaine (EMLA) cream Apply 1 application topically as needed. 30 g 6  . LORazepam (ATIVAN) 0.5 MG tablet Take 1 tablet (0.5 mg total) by mouth every 6 (six) hours as needed for anxiety. 30 tablet 0  . magnesium oxide (MAG-OX) 400 (241.3 Mg) MG tablet Take 1 tablet (400 mg total) by mouth 2 (two) times daily. 60 tablet 3  . morphine (MS CONTIN) 60 MG 12 hr tablet Take 1 tablet (60 mg total) by mouth every 12 (twelve) hours. 60 tablet 0  . morphine (MSIR) 30 MG tablet Take 1 tablet  (30 mg total) by mouth every 6 (six) hours as needed for severe pain. 90 tablet 0  . ondansetron (ZOFRAN) 8 MG tablet Take 1 tablet (8 mg total) by mouth every 8 (eight) hours as needed. 60 tablet 3  . phenazopyridine (PYRIDIUM) 200 MG tablet Take 1 tablet (200 mg total) by mouth 3 (three) times daily as needed for pain. 10 tablet 0  . polyethylene glycol (MIRALAX / GLYCOLAX) packet Take 17 g by mouth daily.    . prochlorperazine (COMPAZINE) 10 MG tablet Take 10 mg by mouth every 6 (six) hours as needed.     No current facility-administered medications for this visit.     PHYSICAL EXAMINATION: ECOG PERFORMANCE STATUS: 2 - Symptomatic, <50% confined to bed  Vitals:   12/06/18 1126  BP: (!) 95/52  Pulse: (!) 118  Resp: 18  Temp: 98.4 F (36.9 C)  SpO2: 100%   Filed Weights   12/06/18 1126  Weight: 170 lb 3.2 oz (77.2 kg)    GENERAL:alert, no distress and comfortable SKIN: skin color, texture, turgor are normal, no rashes or significant lesions EYES: normal, Conjunctiva are pink and non-injected, sclera clear OROPHARYNX:no exudate, no erythema and lips, buccal mucosa, and tongue normal  NECK: supple, thyroid normal size, non-tender, without nodularity LYMPH:  no palpable lymphadenopathy in the cervical, axillary or inguinal LUNGS: clear to auscultation and percussion with normal breathing effort HEART: regular rate & rhythm and no murmurs with moderate lower extremity edema ABDOMEN:abdomen soft, non-tender and normal bowel sounds Musculoskeletal:no cyanosis of digits and no clubbing  NEURO: alert & oriented x 3 with fluent speech, no focal motor/sensory deficits  LABORATORY DATA:  I have reviewed the data as listed    Component Value Date/Time   NA 137 12/06/2018 1042   K 3.9 12/06/2018 1042   CL 102 12/06/2018 1042   CO2 22 12/06/2018 1042   GLUCOSE 130 (H) 12/06/2018 1042   BUN 24 (H) 12/06/2018 1042   CREATININE 1.55 (H) 12/06/2018 1042   CREATININE 1.30 (H)  06/19/2018 1155   CALCIUM 9.2 12/06/2018 1042   PROT 7.7 12/06/2018 1042   ALBUMIN 2.6 (L) 12/06/2018 1042   AST 10 (L) 12/06/2018 1042   ALT 7 12/06/2018 1042   ALKPHOS 115 12/06/2018 1042   BILITOT 0.5 12/06/2018 1042   GFRNONAA 38 (L) 12/06/2018 1042   GFRNONAA 47 (L) 06/19/2018 1155   GFRAA 44 (L)  12/06/2018 1042   GFRAA 55 (L) 06/19/2018 1155    No results found for: SPEP, UPEP  Lab Results  Component Value Date   WBC 15.0 (H) 12/06/2018   NEUTROABS 13.4 (H) 12/06/2018   HGB 8.6 (L) 12/06/2018   HCT 26.7 (L) 12/06/2018   MCV 92.7 12/06/2018   PLT 370 12/06/2018      Chemistry      Component Value Date/Time   NA 137 12/06/2018 1042   K 3.9 12/06/2018 1042   CL 102 12/06/2018 1042   CO2 22 12/06/2018 1042   BUN 24 (H) 12/06/2018 1042   CREATININE 1.55 (H) 12/06/2018 1042   CREATININE 1.30 (H) 06/19/2018 1155      Component Value Date/Time   CALCIUM 9.2 12/06/2018 1042   ALKPHOS 115 12/06/2018 1042   AST 10 (L) 12/06/2018 1042   ALT 7 12/06/2018 1042   BILITOT 0.5 12/06/2018 1042       RADIOGRAPHIC STUDIES: I have personally reviewed the radiological images as listed and agreed with the findings in the report. Vas [name] Lower Extremity Venous (dvt)  Result Date: 11/25/2018  Lower Venous Study Indications: Edema.  Performing Technologist: June Leap RDMS, RVT  Examination Guidelines: A complete evaluation includes B-mode imaging, spectral Doppler, color Doppler, and power Doppler as needed of all accessible portions of each vessel. Bilateral testing is considered an integral part of a complete examination. Limited examinations for reoccurring indications may be performed as noted.  Right Venous Findings: +---------+---------------+---------+-----------+----------+-------+          CompressibilityPhasicitySpontaneityPropertiesSummary +---------+---------------+---------+-----------+----------+-------+ CFV      Full           Yes      Yes                           +---------+---------------+---------+-----------+----------+-------+ SFJ      Full                                                 +---------+---------------+---------+-----------+----------+-------+ FV Prox  Full                                                 +---------+---------------+---------+-----------+----------+-------+ FV Mid   Full                                                 +---------+---------------+---------+-----------+----------+-------+ FV DistalFull                                                 +---------+---------------+---------+-----------+----------+-------+ PFV      Full                                                 +---------+---------------+---------+-----------+----------+-------+ POP      Full  Yes      Yes                          +---------+---------------+---------+-----------+----------+-------+ PTV      Full                                                 +---------+---------------+---------+-----------+----------+-------+ PERO     Full                                                 +---------+---------------+---------+-----------+----------+-------+  Left Venous Findings: +---+---------------+---------+-----------+----------+-------+    CompressibilityPhasicitySpontaneityPropertiesSummary +---+---------------+---------+-----------+----------+-------+ CFVFull           Yes      Yes                          +---+---------------+---------+-----------+----------+-------+    Summary: Right: There is no evidence of deep vein thrombosis in the lower extremity. No cystic structure found in the popliteal fossa. Left: No evidence of common femoral vein obstruction.  *See table(s) above for measurements and observations. Electronically signed by Harold Barban MD on 11/25/2018 at 4:58:57 PM.    Final     All questions were answered. The patient knows to call the clinic with any problems, questions  or concerns. No barriers to learning was detected.  I spent 25 minutes counseling the patient face to face. The total time spent in the appointment was 40 minutes and more than 50% was on counseling and review of test results  [name], MD 12/06/2018 12:19 PM

## 2018-12-06 NOTE — Assessment & Plan Note (Signed)
She has intermittent diarrhea causing dehydration I recommend her to take Imodium I recommend IV fluid resuscitation

## 2018-12-07 ENCOUNTER — Inpatient Hospital Stay: Payer: BLUE CROSS/BLUE SHIELD

## 2018-12-07 VITALS — BP 114/76 | HR 93 | Temp 98.7°F | Resp 18

## 2018-12-07 DIAGNOSIS — C539 Malignant neoplasm of cervix uteri, unspecified: Secondary | ICD-10-CM

## 2018-12-07 DIAGNOSIS — C530 Malignant neoplasm of endocervix: Secondary | ICD-10-CM | POA: Diagnosis not present

## 2018-12-07 MED ORDER — SODIUM CHLORIDE 0.9% FLUSH
10.0000 mL | INTRAVENOUS | Status: DC | PRN
  Administered 2018-12-07: 10 mL
  Filled 2018-12-07: qty 10

## 2018-12-07 MED ORDER — SODIUM CHLORIDE 0.9 % IV SOLN
Freq: Once | INTRAVENOUS | Status: AC
  Administered 2018-12-07: 10:00:00 via INTRAVENOUS
  Filled 2018-12-07: qty 250

## 2018-12-07 MED ORDER — SODIUM CHLORIDE 0.9 % IV SOLN
Freq: Once | INTRAVENOUS | Status: AC
  Administered 2018-12-07: 11:00:00 via INTRAVENOUS
  Filled 2018-12-07: qty 5

## 2018-12-07 MED ORDER — HEPARIN SOD (PORK) LOCK FLUSH 100 UNIT/ML IV SOLN
500.0000 [IU] | Freq: Once | INTRAVENOUS | Status: AC | PRN
  Administered 2018-12-07: 500 [IU]
  Filled 2018-12-07: qty 5

## 2018-12-07 MED ORDER — SODIUM CHLORIDE 0.9 % IV SOLN
140.0000 mg/m2 | Freq: Once | INTRAVENOUS | Status: AC
  Administered 2018-12-07: 270 mg via INTRAVENOUS
  Filled 2018-12-07: qty 45

## 2018-12-07 MED ORDER — DIPHENHYDRAMINE HCL 50 MG/ML IJ SOLN
INTRAMUSCULAR | Status: AC
  Filled 2018-12-07: qty 1

## 2018-12-07 MED ORDER — DIPHENHYDRAMINE HCL 50 MG/ML IJ SOLN
50.0000 mg | Freq: Once | INTRAMUSCULAR | Status: AC
  Administered 2018-12-07: 50 mg via INTRAVENOUS

## 2018-12-07 MED ORDER — PALONOSETRON HCL INJECTION 0.25 MG/5ML
INTRAVENOUS | Status: AC
  Filled 2018-12-07: qty 5

## 2018-12-07 MED ORDER — SODIUM CHLORIDE 0.9 % IV SOLN
290.0000 mg | Freq: Once | INTRAVENOUS | Status: AC
  Administered 2018-12-07: 290 mg via INTRAVENOUS
  Filled 2018-12-07: qty 29

## 2018-12-07 MED ORDER — FAMOTIDINE IN NACL 20-0.9 MG/50ML-% IV SOLN
INTRAVENOUS | Status: AC
  Filled 2018-12-07: qty 50

## 2018-12-07 MED ORDER — PALONOSETRON HCL INJECTION 0.25 MG/5ML
0.2500 mg | Freq: Once | INTRAVENOUS | Status: AC
  Administered 2018-12-07: 0.25 mg via INTRAVENOUS

## 2018-12-07 MED ORDER — FAMOTIDINE IN NACL 20-0.9 MG/50ML-% IV SOLN
20.0000 mg | Freq: Once | INTRAVENOUS | Status: AC
  Administered 2018-12-07: 20 mg via INTRAVENOUS

## 2018-12-07 NOTE — Progress Notes (Signed)
12/07/18  Per Dr Alvy Bimler please keep dose of Carboplatin at 290 mg despite today's current scr of 1.55.  Dose in Epic modified to reflect orders received.  T.O. Dr Lesli Albee, PharmD

## 2018-12-07 NOTE — Patient Instructions (Signed)
   [facility] Discharge Instructions for Patients Receiving Chemotherapy  Today you received the following chemotherapy agents Taxol and Carboplatin   To help prevent nausea and vomiting after your treatment, we encourage you to take your nausea medication as directed.    If you develop nausea and vomiting that is not controlled by your nausea medication, call the clinic.   BELOW ARE SYMPTOMS THAT SHOULD BE REPORTED IMMEDIATELY:  *FEVER GREATER THAN 100.5 F  *CHILLS WITH OR WITHOUT FEVER  NAUSEA AND VOMITING THAT IS NOT CONTROLLED WITH YOUR NAUSEA MEDICATION  *UNUSUAL SHORTNESS OF BREATH  *UNUSUAL BRUISING OR BLEEDING  TENDERNESS IN MOUTH AND THROAT WITH OR WITHOUT PRESENCE OF ULCERS  *URINARY PROBLEMS  *BOWEL PROBLEMS  UNUSUAL RASH Items with * indicate a potential emergency and should be followed up as soon as possible.  Feel free to call the clinic should you have any questions or concerns. The clinic phone number is [phone].  Please show the CHEMO ALERT CARD at check-in to the Emergency Department and triage nurse.   

## 2018-12-09 ENCOUNTER — Emergency Department (HOSPITAL_COMMUNITY)
Admission: EM | Admit: 2018-12-09 | Discharge: 2018-12-09 | Disposition: A | Discharge status: Home / Self Care | Payer: Medicaid Other | Attending: Emergency Medicine | Admitting: Emergency Medicine

## 2018-12-09 ENCOUNTER — Encounter (HOSPITAL_COMMUNITY): Payer: Self-pay

## 2018-12-09 DIAGNOSIS — R109 Unspecified abdominal pain: Secondary | ICD-10-CM | POA: Insufficient documentation

## 2018-12-09 DIAGNOSIS — N390 Urinary tract infection, site not specified: Secondary | ICD-10-CM | POA: Insufficient documentation

## 2018-12-09 DIAGNOSIS — Z9221 Personal history of antineoplastic chemotherapy: Secondary | ICD-10-CM | POA: Diagnosis not present

## 2018-12-09 DIAGNOSIS — Z79899 Other long term (current) drug therapy: Secondary | ICD-10-CM | POA: Diagnosis not present

## 2018-12-09 DIAGNOSIS — B20 Human immunodeficiency virus [HIV] disease: Secondary | ICD-10-CM | POA: Insufficient documentation

## 2018-12-09 DIAGNOSIS — Z8541 Personal history of malignant neoplasm of cervix uteri: Secondary | ICD-10-CM | POA: Insufficient documentation

## 2018-12-09 DIAGNOSIS — E039 Hypothyroidism, unspecified: Secondary | ICD-10-CM | POA: Insufficient documentation

## 2018-12-09 DIAGNOSIS — Z923 Personal history of irradiation: Secondary | ICD-10-CM | POA: Diagnosis not present

## 2018-12-09 DIAGNOSIS — R82998 Other abnormal findings in urine: Secondary | ICD-10-CM | POA: Diagnosis present

## 2018-12-09 LAB — COMPREHENSIVE METABOLIC PANEL
ALBUMIN: 2.8 g/dL — AB (ref 3.5–5.0)
ALT: 12 U/L (ref 0–44)
ANION GAP: 11 (ref 5–15)
AST: 18 U/L (ref 15–41)
Alkaline Phosphatase: 81 U/L (ref 38–126)
BUN: 43 mg/dL — ABNORMAL HIGH (ref 6–20)
CO2: 20 mmol/L — ABNORMAL LOW (ref 22–32)
Calcium: 8.5 mg/dL — ABNORMAL LOW (ref 8.9–10.3)
Chloride: 104 mmol/L (ref 98–111)
Creatinine, Ser: 1.6 mg/dL — ABNORMAL HIGH (ref 0.44–1.00)
GFR calc Af Amer: 42 mL/min — ABNORMAL LOW (ref >60)
GFR calc non Af Amer: 37 mL/min — ABNORMAL LOW (ref >60)
GLUCOSE: 131 mg/dL — AB (ref 70–99)
Potassium: 4.1 mmol/L (ref 3.5–5.1)
Sodium: 135 mmol/L (ref 135–145)
Total Bilirubin: 0.8 mg/dL (ref 0.3–1.2)
Total Protein: 7.1 g/dL (ref 6.5–8.1)

## 2018-12-09 LAB — URINALYSIS, ROUTINE W REFLEX MICROSCOPIC
BILIRUBIN URINE: NEGATIVE
Bilirubin Urine: NEGATIVE
GLUCOSE, UA: NEGATIVE mg/dL
Glucose, UA: NEGATIVE mg/dL
Ketones, ur: NEGATIVE mg/dL
Ketones, ur: NEGATIVE mg/dL
Nitrite: POSITIVE — AB
Nitrite: NEGATIVE
PH: 8 (ref 5.0–8.0)
Protein, ur: >=300 mg/dL — AB
Protein, ur: 100 mg/dL — AB
RBC / HPF: >50 RBC/hpf — ABNORMAL HIGH (ref 0–5)
Specific Gravity, Urine: 1.015 (ref 1.005–1.030)
Specific Gravity, Urine: 1.018 (ref 1.005–1.030)
WBC, UA: >50 WBC/hpf — ABNORMAL HIGH (ref 0–5)
WBC, UA: >50 WBC/hpf — ABNORMAL HIGH (ref 0–5)
pH: 6 (ref 5.0–8.0)

## 2018-12-09 LAB — CBC WITH DIFFERENTIAL/PLATELET
Abs Immature Granulocytes: 0.13 10*3/uL — ABNORMAL HIGH (ref 0.00–0.07)
Basophils Absolute: 0 10*3/uL (ref 0.0–0.1)
Basophils Relative: 0 %
EOS ABS: 0 10*3/uL (ref 0.0–0.5)
Eosinophils Relative: 0 %
HCT: 28.7 % — ABNORMAL LOW (ref 36.0–46.0)
Hemoglobin: 8.7 g/dL — ABNORMAL LOW (ref 12.0–15.0)
Immature Granulocytes: 1 %
Lymphocytes Relative: 3 %
Lymphs Abs: 0.4 10*3/uL — ABNORMAL LOW (ref 0.7–4.0)
MCH: 28.6 pg (ref 26.0–34.0)
MCHC: 30.3 g/dL (ref 30.0–36.0)
MCV: 94.4 fL (ref 80.0–100.0)
Monocytes Absolute: 0.1 10*3/uL (ref 0.1–1.0)
Monocytes Relative: 1 %
Neutro Abs: 12.5 10*3/uL — ABNORMAL HIGH (ref 1.7–7.7)
Neutrophils Relative %: 95 %
Platelets: 421 10*3/uL — ABNORMAL HIGH (ref 150–400)
RBC: 3.04 MIL/uL — ABNORMAL LOW (ref 3.87–5.11)
RDW: 15.6 % — ABNORMAL HIGH (ref 11.5–15.5)
WBC: 13.2 10*3/uL — ABNORMAL HIGH (ref 4.0–10.5)
nRBC: 0 % (ref 0.0–0.2)

## 2018-12-09 LAB — LACTIC ACID, PLASMA
Lactic Acid, Venous: 1.7 mmol/L (ref 0.5–1.9)
Lactic Acid, Venous: 0.8 mmol/L (ref 0.5–1.9)

## 2018-12-09 MED ORDER — AMOXICILLIN-POT CLAVULANATE 875-125 MG PO TABS: 1.0000 | ORAL_TABLET | Freq: Two times a day (BID) | ORAL | 0 refills | Status: DC

## 2018-12-09 MED ORDER — AMOXICILLIN-POT CLAVULANATE 875-125 MG PO TABS
1.0000 | ORAL_TABLET | Freq: Once | ORAL | Status: AC
  Administered 2018-12-09: 1 via ORAL
  Filled 2018-12-09: qty 1

## 2018-12-09 MED ORDER — HEPARIN SOD (PORK) LOCK FLUSH 100 UNIT/ML IV SOLN
500.0000 [IU] | Freq: Once | INTRAVENOUS | Status: AC | PRN
  Administered 2018-12-09: 500 [IU]
  Filled 2018-12-09: qty 5

## 2018-12-09 NOTE — ED Notes (Signed)
Called lab to confirm labs.

## 2018-12-09 NOTE — ED Triage Notes (Signed)
Patient presented to ed with c/o abdominal. Patient state she also have she have bilateral stent placement a couple a month ago and she is worry about the odor coming from it. She is a cancer patient who is currently on chemo treatment. Patient state her last chemo treatment was last Friday.

## 2018-12-09 NOTE — ED Notes (Signed)
RN spoke with Jana Half RN in [place]. Jana Half RN will send 4 Nephrostomy drainage bags if available to ED.

## 2018-12-09 NOTE — ED Provider Notes (Signed)
[facility] DEPT Provider Note   CSN: [id number] Arrival date & time: 12/09/18  5102     History   Chief Complaint Chief Complaint  Patient presents with  . Abdominal Pain    HPI Misty Thomas is a 53 y.o. female.  HPI   Patient presents for evaluation of urine which smells like "ammonia."  She also had transient abdominal pain but that has resolved.  Onset of symptoms yesterday.  She has bilateral nephrostomy tubes, is currently receiving chemotherapy for cancer of the cervix.  She denies fever, chills, cough, shortness of breath, chest pain, weakness or dizziness.  There are no other known modifying factors.  Past Medical History:  Diagnosis Date  . Acquired pancytopenia ([place]) 03/2018  . Anemia    Mild  . Cancer associated pain   . 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 Chemotherapy induced nausea and vomiting   . Diabetes mellitus type 2, diet-controlled ([place])    pt. denies No meds  . Frequency of urination   . History of cellulitis 2018   bilateral lower leg w/ mrsa  . History of external beam radiation therapy 02-05-2018  to 03-16-2018   cervical cancer  . History of MRSA infection 2018   w/ bilateral lower leg cellulitis  . HIV (human immunodeficiency virus infection) ([place])    asymptomatic  . Hypomagnesemia 03/2018   Severe---- takes oral magnesium and IV magnesium as needed (cancer center)  . Intermittent diarrhea    due to radiation/ chemo  . Nocturia   . Port-A-Cath in place    Power port  . Radiation burn    LOWER ABD.  04-06-2018  per is healing  . Subclinical hypothyroidism    w/ thyroiditis , dx 01-22-2018 PET scan  . Thrombocytopenia ([place])   . Wears glasses     Patient  Active Problem List   Diagnosis Date Noted  . Bilateral leg edema 11/26/2018  . Weakness generalized   . DNR (do not resuscitate)   . Perirectal abscess   . Goals of care, counseling/discussion   . AKI (acute kidney injury) ([place]) 10/31/2018  . Palliative care by specialist   . 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 Subclinical hypothyroidism   . Port-A-Cath in place   . History of external beam radiation therapy   . Back pain 10/27/2018  . Presacral pelvic abscess from necrotic cervical cancer & rectovaginal fistula 10/26/2018  . DNR (do not resuscitate) discussion 10/15/2018  . Hydronephrosis 10/10/2018  . Other constipation 07/20/2018  . Preventive measure 07/20/2018  . Routine screening for STI (sexually transmitted infection) 07/02/2018  . Vaccine counseling 07/02/2018  . Hypomagnesemia 02/22/2018  . Pancytopenia, acquired ([place]) 02/22/2018  . Chemotherapy-induced nausea 02/15/2018  . Diarrhea 02/15/2018  . HIV (human immunodeficiency virus infection) ([place]) 02/09/2018  . Acquired hypothyroidism 02/08/2018  . Anemia, blood loss 02/08/2018  . Abnormal thyroid uptake 01/25/2018  . Cancer associated pain 01/25/2018  . Diabetes mellitus without complication ([place]) 58/52/7782  . Abnormal vaginal bleeding   . Primary cervical cancer with metastasis - Stage IV 01/04/2018    Past Surgical History:  Procedure Laterality Date  . CYSTOSCOPY     retrograde pyelogram right ureteral stent placement  . CYSTOSCOPY W/ URETERAL STENT PLACEMENT Bilateral 10/24/2018  Procedure: CYSTOSCOPY WITH BILATERAL RETROGRADE PYELOGRAM RIGHT Wyvonnia Dusky STENT PLACEMENT;  Surgeon: Ardis Hughs, MD;  Location: [facility];  Service: Urology;  Laterality: Bilateral;  . EUA/ CERVICAL BX  01-04-2018   dr Ihor Dow  [facility]  . IR FLUORO GUIDE PORT INSERTION RIGHT  02/01/2018  . IR NEPHROSTOMY PLACEMENT LEFT  11/04/2018  . IR NEPHROSTOMY PLACEMENT RIGHT  11/04/2018  . TANDEM RING INSERTION  N/A 03/20/2018   Procedure: TANDEM RING INSERTION;  Surgeon: Gery Pray, MD;  Location: [facility];  Service: Urology;  Laterality: N/A;  . TANDEM RING INSERTION N/A 03/26/2018   Procedure: TANDEM RING INSERTION;  Surgeon: Gery Pray, MD;  Location: [facility];  Service: Urology;  Laterality: N/A;  . TANDEM RING INSERTION N/A 04/04/2018   Procedure: TANDEM RING INSERTION;  Surgeon: Gery Pray, MD;  Location: [facility];  Service: Urology;  Laterality: N/A;  . TANDEM RING INSERTION N/A 04/12/2018   Procedure: TANDEM RING INSERTION;  Surgeon: Gery Pray, MD;  Location: [facility];  Service: Urology;  Laterality: N/A;  . TANDEM RING INSERTION N/A 04/18/2018   Procedure: TANDEM RING INSERTION;  Surgeon: Gery Pray, MD;  Location: [facility];  Service: Urology;  Laterality: N/A;  . TUBAL LIGATION Bilateral 1990s     OB History    Gravida  5   Para  3   Term  3   Preterm      AB  2   Living  3     SAB      TAB  2   Ectopic      Multiple      Live Births               Home Medications    Prior to Admission medications   Medication Sig Start Date End Date Taking? Authorizing Provider  acetaminophen (TYLENOL) 500 MG tablet Take 500 mg by mouth every 6 (six) hours as needed for mild pain.   Yes [provider]  BIKTARVY 50-200-25 MG TABS tablet TAKE 1 TABLET BY MOUTH DAILY. 09/14/18  Yes Comer, Okey Regal, MD  dexamethasone (DECADRON) 4 MG tablet Take 4 mg by mouth as directed. Take 5 pills the night before and 5 pills the morning of chemo, every 3 weeks   Yes [provider]  furosemide (LASIX) 20 MG tablet Take 20 mg by mouth daily as needed for edema.   Yes [provider]  levothyroxine (SYNTHROID, LEVOTHROID) 25 MCG tablet Take 1 tablet (25 mcg total) by mouth daily before breakfast. 02/08/18  Yes Gorsuch, Ni, MD  lidocaine-prilocaine (EMLA) cream Apply 1  application topically as needed. 11/15/18  Yes Gorsuch, Ni, MD  loperamide (IMODIUM) 2 MG capsule Take 2 mg by mouth as needed for diarrhea or loose stools.   Yes [provider]  magnesium oxide (MAG-OX) 400 (241.3 Mg) MG tablet Take 1 tablet (400 mg total) by mouth 2 (two) times daily. 03/08/18  Yes Gorsuch, Ni, MD  morphine (MS CONTIN) 60 MG 12 hr tablet Take 1 tablet (60 mg total) by mouth every 12 (twelve) hours. 11/23/18  Yes Gorsuch, Ni, MD  morphine (MSIR) 30 MG tablet Take 1 tablet (30 mg total) by mouth every 6 (six) hours as needed for severe pain. 11/23/18  Yes Gorsuch, Ni, MD  ondansetron (ZOFRAN) 8 MG tablet Take 1 tablet (8 mg total) by mouth every 8 (eight) hours as needed. 12/06/18  Yes [name], MD  polyethylene glycol (MIRALAX / GLYCOLAX) packet Take 17 g by mouth daily.   Yes [provider]  prochlorperazine (COMPAZINE) 10 MG tablet Take 10 mg by mouth every 6 (six) hours as needed.   Yes [provider]  amoxicillin-clavulanate (AUGMENTIN) 875-125 MG tablet Take 1 tablet by mouth 2 (two) times daily. One po bid x 7 days 12/09/18   Daleen Bo, MD  LORazepam (ATIVAN) 0.5 MG tablet Take 1 tablet (0.5 mg total) by mouth every 6 (six) hours as needed for anxiety. 11/09/18   Nita Sells, MD  phenazopyridine (PYRIDIUM) 200 MG tablet Take 1 tablet (200 mg total) by mouth 3 (three) times daily as needed for pain. Patient not taking: Reported on 12/09/2018 10/24/18   Ardis Hughs, MD    Family History Family History  Problem Relation Age of Onset  . Cancer Maternal Grandmother        unknown ca    Social History Social History   Tobacco Use  . Smoking status: Never Smoker  . Smokeless tobacco: Never Used  Substance Use Topics  . Alcohol use: Not Currently    Frequency: Never  . Drug use: No     Allergies   Patient has no known allergies.   Review of Systems Review of Systems  All other systems reviewed and are  negative.    Physical Exam Updated Vital Signs BP 111/70 (BP Location: Right Arm)   Pulse (!) 105   Temp (S) 98.1 F (36.7 C) (Rectal)   Resp 18   Ht 5\' 1"  (1.549 m)   Wt 77 kg   LMP 02/03/2018   SpO2 100%   BMI 32.07 kg/m   Physical Exam Vitals signs and nursing note reviewed.  Constitutional:      Appearance: She is well-developed.  HENT:     Head: Normocephalic and atraumatic.     Right Ear: External ear normal.     Left Ear: External ear normal.  Eyes:     Conjunctiva/sclera: Conjunctivae normal.     Pupils: Pupils are equal, round, and reactive to light.  Neck:     Musculoskeletal: Normal range of motion and neck supple.     Trachea: Phonation normal.  Cardiovascular:     Rate and Rhythm: Normal rate and regular rhythm.     Heart sounds: Normal heart sounds.  Pulmonary:     Effort: Pulmonary effort is normal. No respiratory distress.     Breath sounds: Normal breath sounds. No stridor.  Abdominal:     General: There is no distension.     Palpations: Abdomen is soft.     Tenderness: There is no abdominal tenderness. There is no guarding.  Genitourinary:    Comments: Urine, bilateral nephrostomy bags appears slightly green in color. Musculoskeletal: Normal range of motion.  Skin:    General: Skin is warm and dry.  Neurological:     Mental Status: She is alert and oriented to person, place, and time.     Cranial Nerves: No cranial nerve deficit.     Sensory: No sensory deficit.     Motor: No abnormal muscle tone.     Coordination: Coordination normal.  Psychiatric:        Mood and Affect: Mood normal.        Behavior: Behavior normal.        Thought Content: Thought content normal.        Judgment: Judgment normal.      ED Treatments / Results  Labs (  all labs ordered are listed, but only abnormal results are displayed) Labs Reviewed  COMPREHENSIVE METABOLIC PANEL - Abnormal; Notable for the following components:      Result Value   CO2 20 (*)     Glucose, Bld 131 (*)    BUN 43 (*)    Creatinine, Ser 1.60 (*)    Calcium 8.5 (*)    Albumin 2.8 (*)    GFR calc non Af Amer 37 (*)    GFR calc Af Amer 42 (*)    All other components within normal limits  CBC WITH DIFFERENTIAL/PLATELET - Abnormal; Notable for the following components:   WBC 13.2 (*)    RBC 3.04 (*)    Hemoglobin 8.7 (*)    HCT 28.7 (*)    RDW 15.6 (*)    Platelets 421 (*)    Neutro Abs 12.5 (*)    Lymphs Abs 0.4 (*)    Abs Immature Granulocytes 0.13 (*)    All other components within normal limits  URINALYSIS, ROUTINE W REFLEX MICROSCOPIC - Abnormal; Notable for the following components:   APPearance CLOUDY (*)    Hgb urine dipstick SMALL (*)    Protein, ur >=300 (*)    Nitrite POSITIVE (*)    Leukocytes, UA LARGE (*)    RBC / HPF >50 (*)    WBC, UA >50 (*)    Bacteria, UA MANY (*)    All other components within normal limits  URINALYSIS, ROUTINE W REFLEX MICROSCOPIC - Abnormal; Notable for the following components:   APPearance TURBID (*)    Hgb urine dipstick MODERATE (*)    Protein, ur 100 (*)    Leukocytes, UA LARGE (*)    WBC, UA >50 (*)    Bacteria, UA MANY (*)    All other components within normal limits  URINE CULTURE  LACTIC ACID, PLASMA  LACTIC ACID, PLASMA    EKG None  Radiology No results found.  Procedures Procedures (including critical care time)  Medications Ordered in ED Medications  heparin lock flush 100 unit/mL (has no administration in time range)  amoxicillin-clavulanate (AUGMENTIN) 875-125 MG per tablet 1 tablet (1 tablet Oral Given 12/09/18 1507)     Initial Impression / Assessment and Plan / ED Course  I have reviewed the triage vital signs and the nursing notes.  Pertinent labs & imaging results that were available during my care of the patient were reviewed by me and considered in my medical decision making (see chart for details).  Clinical Course as of Dec 09 1512  Sun Dec 09, 2018  1437 Normal  Lactic acid,  plasma [EW]  1437 Normal except white count high, hemoglobin low  CBC with Differential(!) [EW]  1437 Abnormal, presence of blood, protein, leukocytes, RBCs, WBCs   Urinalysis, Routine w reflex microscopic(!) [EW]  1438 Normal except CO2 low, glucose high, BUN high, creatinine high, calcium low, albumin low  Comprehensive metabolic panel(!) [EW]    Clinical Course User Index [EW] Daleen Bo, MD     Patient Vitals for the past 24 hrs:  BP Temp Temp src Pulse Resp SpO2 Height Weight  12/09/18 1430 111/70 - - (!) 105 18 100 % - -  12/09/18 1415 - - - (!) 102 - 100 % - -  12/09/18 1400 104/65 - - 98 - 97 % - -  12/09/18 1330 113/70 - - 90 17 100 % - -  12/09/18 1300 104/69 - - (!) 105 - 98 % - -  12/09/18 1256 107/67 - - 95 15 97 % - -  12/09/18 1230 107/67 - - 99 - 100 % - -  12/09/18 1200 104/73 - - 88 20 100 % - -  12/09/18 1130 (!) 114/58 - - 94 19 100 % - -  12/09/18 1100 105/61 - - 92 (!) 21 100 % - -  12/09/18 1030 107/64 - - 88 20 98 % - -  12/09/18 1000 99/61 - - 73 (!) 21 100 % - -  12/09/18 0955 - 98.1 F (36.7 C) Rectal - - - - -  12/09/18 0930 105/71 - - 85 (!) 22 100 % - -  12/09/18 0853 - - - - - - 5\' 1"  (1.549 m) 77 kg  12/09/18 0850 97/69 97.7 F (36.5 C) Oral (!) 111 18 100 % - -    3:06 PM Reevaluation with update and discussion. After initial assessment and treatment, an updated evaluation reveals she complains of pain similar to her usual with her known cervix answer.  And is discussed with patient, and daughter, questions answered. Daleen Bo   Medical Decision Making: Patient complains her urine odor, and has abnormal urinalysis, from bilateral nephrostomy tubes.  Cultures were sent from both tubes.  Doubt sepsis or metabolic instability.  Doubt acute intra-abdominal abnormality.  No indication for hospitalization at this time.  CRITICAL CARE-no Performed by: Daleen Bo  Nursing Notes Reviewed/ Care Coordinated Applicable Imaging  Reviewed Interpretation of Laboratory Data incorporated into ED treatment  The patient appears reasonably screened and/or stabilized for discharge and I doubt any other medical condition or other [facility] requiring further screening, evaluation, or treatment in the ED at this time prior to discharge.  Plan: Home Medications-continue usual medications; Home Treatments-continue current treatments planned and scheduled.; return here if the recommended treatment, does not improve the symptoms; Recommended follow up-PCP versus oncology follow-up 1 to 2 weeks for repeat urinalysis   Final Clinical Impressions(s) / ED Diagnoses   Final diagnoses:  Urinary tract infection without hematuria, site unspecified    ED Discharge Orders         Ordered    amoxicillin-clavulanate (AUGMENTIN) 875-125 MG tablet  2 times daily     12/09/18 1512           Daleen Bo, MD 12/09/18 1514

## 2018-12-09 NOTE — ED Notes (Signed)
Urine has been collected from both Left and Right Nephrostomy bags and have been labeled and sent to Lab.

## 2018-12-09 NOTE — Discharge Instructions (Addendum)
We are treating you for urinary tract infection which may be in 1 or both of your kidneys.  Make sure you are drinking plenty of fluids.  Start taking the antibiotic prescription later this evening.  Follow-up with your primary care doctor for checkup and repeat urine testing within 1 week.

## 2018-12-09 NOTE — ED Notes (Signed)
RN is calling IR to attain 4 Nephrostomy bags for patient.

## 2018-12-09 NOTE — ED Notes (Signed)
Urine that has been updated at 1018 is from Right Nephrostomy.

## 2018-12-10 ENCOUNTER — Ambulatory Visit (HOSPITAL_COMMUNITY)
Admission: RE | Admit: 2018-12-10 | Discharge: 2018-12-10 | Disposition: A | Discharge status: Home / Self Care | Payer: BLUE CROSS/BLUE SHIELD | Source: Ambulatory Visit | Attending: Medical | Admitting: Medical

## 2018-12-10 ENCOUNTER — Encounter (HOSPITAL_COMMUNITY): Payer: Self-pay | Admitting: Interventional Radiology

## 2018-12-10 ENCOUNTER — Other Ambulatory Visit: Payer: Self-pay | Admitting: Interventional Radiology

## 2018-12-10 ENCOUNTER — Other Ambulatory Visit: Payer: Self-pay | Admitting: Medical

## 2018-12-10 ENCOUNTER — Inpatient Hospital Stay: Payer: BLUE CROSS/BLUE SHIELD | Attending: Gynecologic Oncology | Admitting: Medical

## 2018-12-10 VITALS — BP 98/74 | HR 128 | Temp 98.6°F | Resp 18

## 2018-12-10 DIAGNOSIS — N1330 Unspecified hydronephrosis: Secondary | ICD-10-CM

## 2018-12-10 DIAGNOSIS — C539 Malignant neoplasm of cervix uteri, unspecified: Secondary | ICD-10-CM

## 2018-12-10 DIAGNOSIS — C530 Malignant neoplasm of endocervix: Secondary | ICD-10-CM | POA: Insufficient documentation

## 2018-12-10 DIAGNOSIS — Z4803 Encounter for change or removal of drains: Secondary | ICD-10-CM | POA: Diagnosis present

## 2018-12-10 DIAGNOSIS — D500 Iron deficiency anemia secondary to blood loss (chronic): Secondary | ICD-10-CM | POA: Insufficient documentation

## 2018-12-10 HISTORY — PX: IR NEPHROSTOMY EXCHANGE LEFT: IMG6069

## 2018-12-10 HISTORY — PX: IR NEPHROSTOMY EXCHANGE RIGHT: IMG6070

## 2018-12-10 MED ORDER — SODIUM CHLORIDE 0.9% FLUSH: 5.0000 mL | Freq: Three times a day (TID) | INTRAVENOUS | Status: DC

## 2018-12-10 MED ORDER — IOPAMIDOL (ISOVUE-300) INJECTION 61%
50.0000 mL | Freq: Once | INTRAVENOUS | Status: AC | PRN
  Administered 2018-12-10: 15 mL

## 2018-12-10 MED ORDER — IOPAMIDOL (ISOVUE-300) INJECTION 61%
INTRAVENOUS | Status: AC
  Administered 2018-12-10: 15 mL
  Filled 2018-12-10: qty 50

## 2018-12-10 MED ORDER — LIDOCAINE HCL 1 % IJ SOLN
INTRAMUSCULAR | Status: AC
  Filled 2018-12-10: qty 20

## 2018-12-10 NOTE — Progress Notes (Signed)
Pt presents today with c/o leakage from nephrostomy bag and "feeling like my bag is not working" on L side.  Pt denies fever/chills or CP, SOB, N/V/D.  Recent ED visit for UTI evaluation, placed on oral abx.  Denies pain.

## 2018-12-10 NOTE — Procedures (Signed)
Interventional Radiology Procedure Note  Procedure: Exchange of bilateral PCN performed as add-on for "non-functional PCN" Bilateral 72F PCN exchange. .  Complications: None  Recommendations:  - Ok to shower tomorrow - Do not submerge - Routine care   Signed,  Dulcy Fanny. Earleen Newport, DO

## 2018-12-10 NOTE — Patient Instructions (Signed)

## 2018-12-12 NOTE — Progress Notes (Signed)
Misty Thomas had gone to the emergency room as her left nephrostomy tube was not working.  She was diagnosed with a UTI and placed on antibiotics.  I contacted Dr. Carlton Adam office.  He had previously seen the patient.  His office recommended that the patient be sent to interventional radiology for evaluation.  She was sent to interventional radiology with her nephrostomy tubes exchanged.  Sandi Mealy, MHS, PA-C Physician Assistant

## 2018-12-14 ENCOUNTER — Other Ambulatory Visit: Payer: Self-pay | Admitting: Hematology and Oncology

## 2018-12-14 ENCOUNTER — Telehealth: Payer: Self-pay | Admitting: *Deleted

## 2018-12-14 ENCOUNTER — Inpatient Hospital Stay: Payer: BLUE CROSS/BLUE SHIELD

## 2018-12-14 DIAGNOSIS — C539 Malignant neoplasm of cervix uteri, unspecified: Secondary | ICD-10-CM

## 2018-12-14 DIAGNOSIS — N1330 Unspecified hydronephrosis: Secondary | ICD-10-CM | POA: Diagnosis not present

## 2018-12-14 DIAGNOSIS — C530 Malignant neoplasm of endocervix: Secondary | ICD-10-CM

## 2018-12-14 DIAGNOSIS — D500 Iron deficiency anemia secondary to blood loss (chronic): Secondary | ICD-10-CM | POA: Diagnosis present

## 2018-12-14 DIAGNOSIS — D61818 Other pancytopenia: Secondary | ICD-10-CM

## 2018-12-14 LAB — COMPREHENSIVE METABOLIC PANEL
ALT: 46 U/L — AB (ref 0–44)
AST: 114 U/L — AB (ref 15–41)
Albumin: 2.1 g/dL — ABNORMAL LOW (ref 3.5–5.0)
Alkaline Phosphatase: 246 U/L — ABNORMAL HIGH (ref 38–126)
Anion gap: 17 — ABNORMAL HIGH (ref 5–15)
BUN: 42 mg/dL — AB (ref 6–20)
CO2: 18 mmol/L — ABNORMAL LOW (ref 22–32)
CREATININE: 2.9 mg/dL — AB (ref 0.44–1.00)
Calcium: 8.7 mg/dL — ABNORMAL LOW (ref 8.9–10.3)
Chloride: 97 mmol/L — ABNORMAL LOW (ref 98–111)
GFR calc Af Amer: 21 mL/min — ABNORMAL LOW (ref >60)
GFR calc non Af Amer: 18 mL/min — ABNORMAL LOW (ref >60)
Glucose, Bld: 143 mg/dL — ABNORMAL HIGH (ref 70–99)
Potassium: 4.1 mmol/L (ref 3.5–5.1)
Sodium: 132 mmol/L — ABNORMAL LOW (ref 135–145)
Total Bilirubin: 4 mg/dL (ref 0.3–1.2)
Total Protein: 7.2 g/dL (ref 6.5–8.1)

## 2018-12-14 LAB — CBC WITH DIFFERENTIAL/PLATELET
Abs Immature Granulocytes: 0.13 10*3/uL — ABNORMAL HIGH (ref 0.00–0.07)
Basophils Absolute: 0 10*3/uL (ref 0.0–0.1)
Basophils Relative: 0 %
Eosinophils Absolute: 0 10*3/uL (ref 0.0–0.5)
Eosinophils Relative: 0 %
HEMATOCRIT: 18.5 % — AB (ref 36.0–46.0)
Hemoglobin: 5.9 g/dL — CL (ref 12.0–15.0)
Immature Granulocytes: 8 %
LYMPHS ABS: 0.3 10*3/uL — AB (ref 0.7–4.0)
Lymphocytes Relative: 16 %
MCH: 29.2 pg (ref 26.0–34.0)
MCHC: 31.9 g/dL (ref 30.0–36.0)
MCV: 91.6 fL (ref 80.0–100.0)
Monocytes Absolute: 0.3 10*3/uL (ref 0.1–1.0)
Monocytes Relative: 16 %
Neutro Abs: 1 10*3/uL — ABNORMAL LOW (ref 1.7–7.7)
Neutrophils Relative %: 60 %
Platelets: 125 10*3/uL — ABNORMAL LOW (ref 150–400)
RBC: 2.02 MIL/uL — ABNORMAL LOW (ref 3.87–5.11)
RDW: 15.2 % (ref 11.5–15.5)
WBC: 1.6 10*3/uL — ABNORMAL LOW (ref 4.0–10.5)
nRBC: 0 % (ref 0.0–0.2)

## 2018-12-14 LAB — MAGNESIUM: Magnesium: 2 mg/dL (ref 1.7–2.4)

## 2018-12-14 LAB — SAMPLE TO BLOOD BANK

## 2018-12-14 LAB — PREPARE RBC (CROSSMATCH)

## 2018-12-14 MED ORDER — ACETAMINOPHEN 325 MG PO TABS
ORAL_TABLET | ORAL | Status: AC
  Filled 2018-12-14: qty 2

## 2018-12-14 MED ORDER — DIPHENHYDRAMINE HCL 25 MG PO CAPS
ORAL_CAPSULE | ORAL | Status: AC
  Filled 2018-12-14: qty 1

## 2018-12-14 MED ORDER — ACETAMINOPHEN 325 MG PO TABS
650.0000 mg | ORAL_TABLET | Freq: Once | ORAL | Status: AC
  Administered 2018-12-14: 650 mg via ORAL

## 2018-12-14 MED ORDER — HEPARIN SOD (PORK) LOCK FLUSH 100 UNIT/ML IV SOLN
250.0000 [IU] | INTRAVENOUS | Status: AC | PRN
  Administered 2018-12-14: 500 [IU]
  Filled 2018-12-14: qty 5

## 2018-12-14 MED ORDER — SODIUM CHLORIDE 0.9% FLUSH
10.0000 mL | INTRAVENOUS | Status: AC | PRN
  Administered 2018-12-14: 10 mL
  Filled 2018-12-14: qty 10

## 2018-12-14 MED ORDER — SODIUM CHLORIDE 0.9% IV SOLUTION
250.0000 mL | Freq: Once | INTRAVENOUS | Status: AC
  Administered 2018-12-14: 250 mL via INTRAVENOUS
  Filled 2018-12-14: qty 250

## 2018-12-14 MED ORDER — SODIUM CHLORIDE 0.9% FLUSH
10.0000 mL | Freq: Once | INTRAVENOUS | Status: AC
  Administered 2018-12-14: 10 mL
  Filled 2018-12-14: qty 10

## 2018-12-14 MED ORDER — DIPHENHYDRAMINE HCL 25 MG PO CAPS
25.0000 mg | ORAL_CAPSULE | Freq: Once | ORAL | Status: AC
  Administered 2018-12-14: 25 mg via ORAL

## 2018-12-14 NOTE — Telephone Encounter (Signed)
Received panic Hgb result & Bili result from lab.  Informed Dr Alvy Bimler.  Pt to get 2 units blood today & Bili may be up due to recent chem.  She would like pt to check with ID to see if she needs to hold any HIV med.  Message given to Sara/Infusion RN to relay to pt.

## 2018-12-14 NOTE — Patient Instructions (Signed)
Blood Transfusion, Adult, Care After This sheet gives you information about how to care for yourself after your procedure. Your doctor may also give you more specific instructions. If you have problems or questions, contact your doctor. Follow these instructions at home:   Take over-the-counter and prescription medicines only as told by your doctor.  Go back to your normal activities as told by your doctor.  Follow instructions from your doctor about how to take care of the area where an IV tube was put into your vein (insertion site). Make sure you: ? Wash your hands with soap and water before you change your bandage (dressing). If there is no soap and water, use hand sanitizer. ? Change your bandage as told by your doctor.  Check your IV insertion site every day for signs of infection. Check for: ? More redness, swelling, or pain. ? More fluid or blood. ? Warmth. ? Pus or a bad smell. Contact a doctor if:  You have more redness, swelling, or pain around the IV insertion site.  You have more fluid or blood coming from the IV insertion site.  Your IV insertion site feels warm to the touch.  You have pus or a bad smell coming from the IV insertion site.  Your pee (urine) turns pink, red, or brown.  You feel weak after doing your normal activities. Get help right away if:  You have signs of a serious allergic or body defense (immune) system reaction, including: ? Itchiness. ? Hives. ? Trouble breathing. ? Anxiety. ? Pain in your chest or lower back. ? Fever, flushing, and chills. ? Fast pulse. ? Rash. ? Watery poop (diarrhea). ? Throwing up (vomiting). ? Dark pee. ? Serious headache. ? Dizziness. ? Stiff neck. ? Yellow color in your face or the white parts of your eyes (jaundice). Summary  After a blood transfusion, return to your normal activities as told by your doctor.  Every day, check for signs of infection where the IV tube was put into your vein.  Some  signs of infection are warm skin, more redness and pain, more fluid or blood, and pus or a bad smell where the needle went in.  Contact your doctor if you feel weak or have any unusual symptoms. This information is not intended to replace advice given to you by your health care provider. Make sure you discuss any questions you have with your health care provider. Document Released: 11/14/2014 Document Revised: 06/17/2016 Document Reviewed: 06/17/2016 Elsevier Interactive Patient Education  2019 [employer]  

## 2018-12-15 ENCOUNTER — Other Ambulatory Visit: Payer: Self-pay

## 2018-12-15 ENCOUNTER — Inpatient Hospital Stay (HOSPITAL_COMMUNITY): Payer: Medicaid Other

## 2018-12-15 ENCOUNTER — Emergency Department (HOSPITAL_COMMUNITY): Payer: Medicaid Other

## 2018-12-15 ENCOUNTER — Inpatient Hospital Stay (HOSPITAL_COMMUNITY)
Admission: EM | Admit: 2018-12-15 | Discharge: 2018-12-19 | DRG: 975 | Disposition: A | Discharge status: Home / Self Care | Payer: Medicaid Other | Attending: Family Medicine | Admitting: Family Medicine

## 2018-12-15 ENCOUNTER — Encounter (HOSPITAL_COMMUNITY): Payer: Self-pay | Admitting: Emergency Medicine

## 2018-12-15 DIAGNOSIS — G893 Neoplasm related pain (acute) (chronic): Secondary | ICD-10-CM | POA: Diagnosis present

## 2018-12-15 DIAGNOSIS — Z66 Do not resuscitate: Secondary | ICD-10-CM | POA: Diagnosis present

## 2018-12-15 DIAGNOSIS — Z79899 Other long term (current) drug therapy: Secondary | ICD-10-CM | POA: Diagnosis not present

## 2018-12-15 DIAGNOSIS — R17 Unspecified jaundice: Secondary | ICD-10-CM | POA: Diagnosis present

## 2018-12-15 DIAGNOSIS — B20 Human immunodeficiency virus [HIV] disease: Secondary | ICD-10-CM | POA: Diagnosis present

## 2018-12-15 DIAGNOSIS — E039 Hypothyroidism, unspecified: Secondary | ICD-10-CM | POA: Diagnosis present

## 2018-12-15 DIAGNOSIS — T451X5A Adverse effect of antineoplastic and immunosuppressive drugs, initial encounter: Secondary | ICD-10-CM | POA: Diagnosis present

## 2018-12-15 DIAGNOSIS — B965 Pseudomonas (aeruginosa) (mallei) (pseudomallei) as the cause of diseases classified elsewhere: Secondary | ICD-10-CM | POA: Diagnosis not present

## 2018-12-15 DIAGNOSIS — K625 Hemorrhage of anus and rectum: Secondary | ICD-10-CM | POA: Diagnosis not present

## 2018-12-15 DIAGNOSIS — N179 Acute kidney failure, unspecified: Secondary | ICD-10-CM | POA: Diagnosis present

## 2018-12-15 DIAGNOSIS — I82411 Acute embolism and thrombosis of right femoral vein: Secondary | ICD-10-CM | POA: Diagnosis present

## 2018-12-15 DIAGNOSIS — R6520 Severe sepsis without septic shock: Secondary | ICD-10-CM | POA: Diagnosis present

## 2018-12-15 DIAGNOSIS — E876 Hypokalemia: Secondary | ICD-10-CM | POA: Diagnosis present

## 2018-12-15 DIAGNOSIS — E872 Acidosis, unspecified: Secondary | ICD-10-CM | POA: Diagnosis present

## 2018-12-15 DIAGNOSIS — L89892 Pressure ulcer of other site, stage 2: Secondary | ICD-10-CM | POA: Clinically undetermined

## 2018-12-15 DIAGNOSIS — I959 Hypotension, unspecified: Secondary | ICD-10-CM | POA: Diagnosis present

## 2018-12-15 DIAGNOSIS — Z936 Other artificial openings of urinary tract status: Secondary | ICD-10-CM | POA: Diagnosis not present

## 2018-12-15 DIAGNOSIS — E119 Type 2 diabetes mellitus without complications: Secondary | ICD-10-CM | POA: Diagnosis present

## 2018-12-15 DIAGNOSIS — Z21 Asymptomatic human immunodeficiency virus [HIV] infection status: Secondary | ICD-10-CM | POA: Diagnosis present

## 2018-12-15 DIAGNOSIS — A4152 Sepsis due to Pseudomonas: Principal | ICD-10-CM | POA: Diagnosis present

## 2018-12-15 DIAGNOSIS — Z9221 Personal history of antineoplastic chemotherapy: Secondary | ICD-10-CM

## 2018-12-15 DIAGNOSIS — N3090 Cystitis, unspecified without hematuria: Secondary | ICD-10-CM | POA: Diagnosis not present

## 2018-12-15 DIAGNOSIS — N390 Urinary tract infection, site not specified: Secondary | ICD-10-CM | POA: Diagnosis present

## 2018-12-15 DIAGNOSIS — A419 Sepsis, unspecified organism: Secondary | ICD-10-CM | POA: Diagnosis not present

## 2018-12-15 DIAGNOSIS — D61818 Other pancytopenia: Secondary | ICD-10-CM | POA: Diagnosis not present

## 2018-12-15 DIAGNOSIS — R600 Localized edema: Secondary | ICD-10-CM | POA: Diagnosis present

## 2018-12-15 DIAGNOSIS — C539 Malignant neoplasm of cervix uteri, unspecified: Secondary | ICD-10-CM | POA: Diagnosis present

## 2018-12-15 DIAGNOSIS — Z923 Personal history of irradiation: Secondary | ICD-10-CM

## 2018-12-15 DIAGNOSIS — Z95828 Presence of other vascular implants and grafts: Secondary | ICD-10-CM

## 2018-12-15 DIAGNOSIS — Z515 Encounter for palliative care: Secondary | ICD-10-CM | POA: Diagnosis not present

## 2018-12-15 DIAGNOSIS — N135 Crossing vessel and stricture of ureter without hydronephrosis: Secondary | ICD-10-CM | POA: Diagnosis not present

## 2018-12-15 DIAGNOSIS — N1330 Unspecified hydronephrosis: Secondary | ICD-10-CM | POA: Diagnosis present

## 2018-12-15 DIAGNOSIS — E871 Hypo-osmolality and hyponatremia: Secondary | ICD-10-CM | POA: Diagnosis present

## 2018-12-15 DIAGNOSIS — A498 Other bacterial infections of unspecified site: Secondary | ICD-10-CM | POA: Diagnosis present

## 2018-12-15 DIAGNOSIS — Z7189 Other specified counseling: Secondary | ICD-10-CM | POA: Diagnosis not present

## 2018-12-15 DIAGNOSIS — R197 Diarrhea, unspecified: Secondary | ICD-10-CM | POA: Diagnosis present

## 2018-12-15 DIAGNOSIS — L8990 Pressure ulcer of unspecified site, unspecified stage: Secondary | ICD-10-CM

## 2018-12-15 DIAGNOSIS — M7989 Other specified soft tissue disorders: Secondary | ICD-10-CM | POA: Diagnosis not present

## 2018-12-15 DIAGNOSIS — R7881 Bacteremia: Secondary | ICD-10-CM | POA: Diagnosis present

## 2018-12-15 DIAGNOSIS — N136 Pyonephrosis: Secondary | ICD-10-CM | POA: Diagnosis present

## 2018-12-15 DIAGNOSIS — B9689 Other specified bacterial agents as the cause of diseases classified elsewhere: Secondary | ICD-10-CM | POA: Diagnosis present

## 2018-12-15 DIAGNOSIS — Z79890 Hormone replacement therapy (postmenopausal): Secondary | ICD-10-CM

## 2018-12-15 DIAGNOSIS — C799 Secondary malignant neoplasm of unspecified site: Secondary | ICD-10-CM | POA: Diagnosis present

## 2018-12-15 DIAGNOSIS — I82401 Acute embolism and thrombosis of unspecified deep veins of right lower extremity: Secondary | ICD-10-CM | POA: Diagnosis not present

## 2018-12-15 DIAGNOSIS — Z8614 Personal history of Methicillin resistant Staphylococcus aureus infection: Secondary | ICD-10-CM

## 2018-12-15 LAB — URINALYSIS, MICROSCOPIC (REFLEX): Squamous Epithelial / HPF: NONE SEEN (ref 0–5)

## 2018-12-15 LAB — URINE CULTURE: Culture: >=100000 — AB

## 2018-12-15 LAB — URINALYSIS, ROUTINE W REFLEX MICROSCOPIC
Glucose, UA: NEGATIVE mg/dL
Nitrite: POSITIVE — AB
Specific Gravity, Urine: 1.02 (ref 1.005–1.030)
pH: 6.5 (ref 5.0–8.0)

## 2018-12-15 LAB — COMPREHENSIVE METABOLIC PANEL
ALT: 31 U/L (ref 0–44)
AST: 44 U/L — ABNORMAL HIGH (ref 15–41)
Albumin: 2 g/dL — ABNORMAL LOW (ref 3.5–5.0)
Alkaline Phosphatase: 139 U/L — ABNORMAL HIGH (ref 38–126)
Anion gap: 9 (ref 5–15)
BUN: 47 mg/dL — ABNORMAL HIGH (ref 6–20)
CO2: 15 mmol/L — ABNORMAL LOW (ref 22–32)
Calcium: 6.3 mg/dL — CL (ref 8.9–10.3)
Chloride: 100 mmol/L (ref 98–111)
Creatinine, Ser: 3.44 mg/dL — ABNORMAL HIGH (ref 0.44–1.00)
GFR calc Af Amer: 17 mL/min — ABNORMAL LOW (ref >60)
GFR calc non Af Amer: 15 mL/min — ABNORMAL LOW (ref >60)
GLUCOSE: 146 mg/dL — AB (ref 70–99)
Potassium: 3 mmol/L — ABNORMAL LOW (ref 3.5–5.1)
Sodium: 124 mmol/L — ABNORMAL LOW (ref 135–145)
Total Bilirubin: 4.6 mg/dL — ABNORMAL HIGH (ref 0.3–1.2)
Total Protein: 5.8 g/dL — ABNORMAL LOW (ref 6.5–8.1)

## 2018-12-15 LAB — I-STAT BETA HCG BLOOD, ED (MC, WL, AP ONLY): HCG, QUANTITATIVE: 10.1 m[IU]/mL — AB (ref <5)

## 2018-12-15 LAB — CBC
HCT: 25.3 % — ABNORMAL LOW (ref 36.0–46.0)
Hemoglobin: 8 g/dL — ABNORMAL LOW (ref 12.0–15.0)
MCH: 29 pg (ref 26.0–34.0)
MCHC: 31.6 g/dL (ref 30.0–36.0)
MCV: 91.7 fL (ref 80.0–100.0)
Platelets: 93 10*3/uL — ABNORMAL LOW (ref 150–400)
RBC: 2.76 MIL/uL — ABNORMAL LOW (ref 3.87–5.11)
RDW: 16 % — ABNORMAL HIGH (ref 11.5–15.5)
WBC: 2.2 10*3/uL — ABNORMAL LOW (ref 4.0–10.5)
nRBC: 0 % (ref 0.0–0.2)

## 2018-12-15 LAB — TYPE AND SCREEN
ABO/RH(D): O NEG
Antibody Screen: NEGATIVE
UNIT DIVISION: 0
Unit division: 0

## 2018-12-15 LAB — BPAM RBC
Blood Product Expiration Date: 202003062359
Blood Product Expiration Date: 202003062359
ISSUE DATE / TIME: 202002071305
ISSUE DATE / TIME: 202002071305
Unit Type and Rh: 9500
Unit Type and Rh: 9500

## 2018-12-15 LAB — LIPASE, BLOOD: Lipase: 23 U/L (ref 11–51)

## 2018-12-15 LAB — DIFFERENTIAL
Basophils Absolute: 0 10*3/uL (ref 0.0–0.1)
Basophils Relative: 0 %
Eosinophils Absolute: 0 10*3/uL (ref 0.0–0.5)
Eosinophils Relative: 0 %
LYMPHS PCT: 12 %
Lymphs Abs: 0.3 10*3/uL — ABNORMAL LOW (ref 0.7–4.0)
Monocytes Absolute: 0.3 10*3/uL (ref 0.1–1.0)
Monocytes Relative: 12 %
Neutro Abs: 1.6 10*3/uL — ABNORMAL LOW (ref 1.7–7.7)
Neutrophils Relative %: 69 %
WBC Morphology: INCREASED

## 2018-12-15 LAB — LACTIC ACID, PLASMA
LACTIC ACID, VENOUS: 1.1 mmol/L (ref 0.5–1.9)
Lactic Acid, Venous: 1.4 mmol/L (ref 0.5–1.9)

## 2018-12-15 MED ORDER — PIPERACILLIN-TAZOBACTAM IN DEX 2-0.25 GM/50ML IV SOLN
2.2500 g | Freq: Three times a day (TID) | INTRAVENOUS | Status: DC
  Administered 2018-12-15 – 2018-12-16 (×3): 2.25 g via INTRAVENOUS
  Filled 2018-12-15 (×5): qty 50

## 2018-12-15 MED ORDER — LACTATED RINGERS IV BOLUS
1000.0000 mL | Freq: Once | INTRAVENOUS | Status: AC
  Administered 2018-12-15: 1000 mL via INTRAVENOUS

## 2018-12-15 MED ORDER — SODIUM CHLORIDE 0.9 % IV SOLN
2.0000 g | Freq: Once | INTRAVENOUS | Status: AC
  Administered 2018-12-15: 2 g via INTRAVENOUS
  Filled 2018-12-15: qty 2

## 2018-12-15 MED ORDER — MORPHINE SULFATE (PF) 2 MG/ML IV SOLN
2.0000 mg | INTRAVENOUS | Status: DC | PRN
  Administered 2018-12-15: 2 mg via INTRAVENOUS
  Filled 2018-12-15: qty 1

## 2018-12-15 MED ORDER — HYDROCORTISONE NA SUCCINATE PF 100 MG IJ SOLR
100.0000 mg | INTRAMUSCULAR | Status: AC
  Administered 2018-12-15: 100 mg via INTRAVENOUS
  Filled 2018-12-15: qty 2

## 2018-12-15 MED ORDER — VANCOMYCIN VARIABLE DOSE PER UNSTABLE RENAL FUNCTION (PHARMACIST DOSING): Status: DC

## 2018-12-15 MED ORDER — VANCOMYCIN HCL 10 G IV SOLR
1500.0000 mg | Freq: Once | INTRAVENOUS | Status: AC
  Administered 2018-12-15: 1500 mg via INTRAVENOUS
  Filled 2018-12-15: qty 1500

## 2018-12-15 MED ORDER — LACTATED RINGERS IV SOLN
INTRAVENOUS | Status: DC
  Administered 2018-12-15: 17:00:00 via INTRAVENOUS

## 2018-12-15 NOTE — Progress Notes (Signed)
A consult was received from an ED physician for cefepime per pharmacy dosing.  The patient's profile has been reviewed for ht/wt/allergies/indication/available labs.   A one time order has been placed for cefepime per MD.  Further antibiotics/pharmacy consults should be ordered by admitting physician if indicated.                       Thank you, Napoleon Form 12/15/2018  1:28 PM

## 2018-12-15 NOTE — H&P (Addendum)
[facility] History and Physical  Misty Thomas MWU:[id number] DOB: [birth year] DOA: 12/15/2018   PCP: Patient, No Pcp Per  Specialists: Dr. Alvy Bimler is her oncologist.  Chief Complaint: Feeling weak, diarrhea, fever  HPI: Misty Thomas is a 53 y.o. female with a past medical history of stage IV cervical cancer receiving chemotherapy, last chemotherapy was on January 30, h/o HIV on antiretroviral treatment, hydronephrosis, status post ureteral stent, status post bilateral percutaneous nephrostomy who presented with complaint of diarrhea that started this morning.  She had 2 episodes of watery stool.  She felt weak and lightheaded.  Denies any syncopal episode.  Has had abdominal pain.  Denies any cough or shortness of breath.  No skin rashes.  No joint pains.  She has noted the urine in the nephrostomy bags to be more cloudy and dark. She denies any nausea or vomiting.  Did have a fever although she did not check her temperature.  In the emergency department patient was noted to be hypotensive with a blood pressure initially in the 60s.  Evaluation revealed an abnormal UA.  She was noted to be pancytopenic.  There was concern for infection in the urinary tract.  She was also found to have acute renal failure.  She will need hospitalization for further management.  Home Medications: Prior to Admission medications   Medication Sig Start Date End Date Taking? Authorizing Provider  acetaminophen (TYLENOL) 500 MG tablet Take 500 mg by mouth every 6 (six) hours as needed for mild pain.   Yes [provider]  amoxicillin-clavulanate (AUGMENTIN) 875-125 MG tablet Take 1 tablet by mouth 2 (two) times daily. One po bid x 7 days 12/09/18  Yes Daleen Bo, MD  BIKTARVY 50-200-25 MG TABS tablet TAKE 1 TABLET BY MOUTH DAILY. 09/14/18  Yes Comer, Okey Regal, MD  dexamethasone (DECADRON) 4 MG tablet Take 4 mg by mouth as directed. Take 5 pills the night before and 5 pills the morning of chemo,  every 3 weeks   Yes [provider]  furosemide (LASIX) 20 MG tablet Take 20 mg by mouth daily as needed for edema.   Yes [provider]  levothyroxine (SYNTHROID, LEVOTHROID) 25 MCG tablet Take 1 tablet (25 mcg total) by mouth daily before breakfast. 02/08/18  Yes Gorsuch, Ni, MD  lidocaine-prilocaine (EMLA) cream Apply 1 application topically as needed. 11/15/18  Yes Gorsuch, Ni, MD  loperamide (IMODIUM) 2 MG capsule Take 2 mg by mouth as needed for diarrhea or loose stools.   Yes [provider]  magnesium oxide (MAG-OX) 400 (241.3 Mg) MG tablet Take 1 tablet (400 mg total) by mouth 2 (two) times daily. 03/08/18  Yes Gorsuch, Ni, MD  morphine (MS CONTIN) 60 MG 12 hr tablet Take 1 tablet (60 mg total) by mouth every 12 (twelve) hours. 11/23/18  Yes Gorsuch, Ni, MD  morphine (MSIR) 30 MG tablet Take 1 tablet (30 mg total) by mouth every 6 (six) hours as needed for severe pain. 11/23/18  Yes Gorsuch, Ni, MD  ondansetron (ZOFRAN) 8 MG tablet Take 1 tablet (8 mg total) by mouth every 8 (eight) hours as needed. 12/06/18  Yes Gorsuch, Ni, MD  polyethylene glycol (MIRALAX / GLYCOLAX) packet Take 17 g by mouth daily.   Yes [provider]  prochlorperazine (COMPAZINE) 10 MG tablet Take 10 mg by mouth every 6 (six) hours as needed.   Yes [provider]  LORazepam (ATIVAN) 0.5 MG tablet Take 1 tablet (0.5 mg total) by mouth  every 6 (six) hours as needed for anxiety. Patient not taking: Reported on 12/15/2018 11/09/18   Nita Sells, MD  phenazopyridine (PYRIDIUM) 200 MG tablet Take 1 tablet (200 mg total) by mouth 3 (three) times daily as needed for pain. Patient not taking: Reported on 12/09/2018 10/24/18   Ardis Hughs, MD    Allergies: No Known Allergies  Past Medical History: Past Medical History:  Diagnosis Date  . Acquired pancytopenia ([place]) 03/2018  . Anemia    Mild  . Cancer associated pain   . Cervical cancer 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 Chemotherapy induced nausea and vomiting   . Diabetes mellitus type 2, diet-controlled ([place])    pt. denies No meds  . Frequency of urination   . History of cellulitis 2018   bilateral lower leg w/ mrsa  . History of external beam radiation therapy 02-05-2018  to 03-16-2018   cervical cancer  . History of MRSA infection 2018   w/ bilateral lower leg cellulitis  . HIV (human immunodeficiency virus infection) ([place])    asymptomatic  . Hypomagnesemia 03/2018   Severe---- takes oral magnesium and IV magnesium as needed (cancer center)  . Intermittent diarrhea    due to radiation/ chemo  . Nocturia   . Port-A-Cath in place    Power port  . Radiation burn    LOWER ABD.  04-06-2018  per is healing  . Subclinical hypothyroidism    w/ thyroiditis , dx 01-22-2018 PET scan  . Thrombocytopenia ([place])   . Wears glasses     Past Surgical History:  Procedure Laterality Date  . CYSTOSCOPY     retrograde pyelogram right ureteral stent placement  . CYSTOSCOPY W/ URETERAL STENT PLACEMENT Bilateral 10/24/2018   Procedure: CYSTOSCOPY WITH BILATERAL RETROGRADE PYELOGRAM RIGHT Wyvonnia Dusky STENT PLACEMENT;  Surgeon: Ardis Hughs, MD;  Location: [facility];  Service: Urology;  Laterality: Bilateral;  . EUA/ CERVICAL BX  01-04-2018   dr Ihor Dow  [facility]  . IR FLUORO GUIDE PORT INSERTION RIGHT  02/01/2018  . IR NEPHROSTOMY EXCHANGE LEFT  12/10/2018  . IR NEPHROSTOMY EXCHANGE RIGHT  12/10/2018  . IR NEPHROSTOMY PLACEMENT LEFT  11/04/2018  . IR NEPHROSTOMY PLACEMENT RIGHT  11/04/2018  . TANDEM RING INSERTION N/A 03/20/2018   Procedure: TANDEM RING INSERTION;  Surgeon: Gery Pray, MD;  Location: [facility];  Service: Urology;  Laterality: N/A;  . TANDEM RING  INSERTION N/A 03/26/2018   Procedure: TANDEM RING INSERTION;  Surgeon: Gery Pray, MD;  Location: [facility];  Service: Urology;  Laterality: N/A;  . TANDEM RING INSERTION N/A 04/04/2018   Procedure: TANDEM RING INSERTION;  Surgeon: Gery Pray, MD;  Location: [facility];  Service: Urology;  Laterality: N/A;  . TANDEM RING INSERTION N/A 04/12/2018   Procedure: TANDEM RING INSERTION;  Surgeon: Gery Pray, MD;  Location: [facility];  Service: Urology;  Laterality: N/A;  . TANDEM RING INSERTION N/A 04/18/2018   Procedure: TANDEM RING INSERTION;  Surgeon: Gery Pray, MD;  Location: [facility];  Service: Urology;  Laterality: N/A;  . TUBAL LIGATION Bilateral 1990s    Social History: She lives with her husband.  Denies smoking alcohol use or illicit drug use.   Family History:  Family History  Problem Relation Age of Onset  . Cancer Maternal Grandmother        unknown ca     Review of Systems - History obtained from the patient General ROS: positive for  - chills and fatigue Psychological ROS: negative Ophthalmic ROS: negative ENT ROS: negative Allergy and Immunology ROS: negative Hematological and Lymphatic ROS: negative Endocrine ROS: negative Respiratory ROS: no cough, shortness of breath, or wheezing Cardiovascular ROS: no chest pain or dyspnea on exertion Gastrointestinal ROS: as in hpi Genito-Urinary ROS: denies dysuria Musculoskeletal ROS: negative Neurological ROS: no TIA or stroke symptoms Dermatological ROS: negative  Physical Examination  Vitals:   12/15/18 1630 12/15/18 1645 12/15/18 1700 12/15/18 1715  BP: 97/66 99/65 99/64  103/66  Pulse:      Resp: (!) 25 (!) 23 18 (!) 24  Temp:      TempSrc:      SpO2:      Weight:      Height:        BP 103/66   Pulse (!) 116   Temp 100 F (37.8 C) (Rectal)   Resp (!) 24   Ht 5\' 1"  (1.549 m)   Wt 77 kg   LMP 02/03/2018   SpO2 100%   BMI 32.07  kg/m   General appearance: alert, cooperative, appears stated age and no distress Head: Normocephalic, without obvious abnormality, atraumatic Eyes: conjunctivae/corneas clear. PERRL, EOM's intact.  Throat: lips, mucosa, and tongue normal; teeth and gums normal Neck: no adenopathy, no carotid bruit, no JVD, supple, symmetrical, trachea midline and thyroid not enlarged, symmetric, no tenderness/mass/nodules Resp: clear to auscultation bilaterally Cardio: S1-S2 is tachycardic regular.  No S3-S4.  No rubs murmurs or bruit. GI: Abdomen is distended.  Tender diffusely without any rebound rigidity or guarding.  No masses organomegaly.  Bilateral nephrostomy tubes noted with dark cloudy urine in both the backs.  No obvious masses organomegaly. Extremities: The lower extremities are significantly swollen.  Difficult to palpate pulses. Pulses: Difficult to palpate lower extremity pulses due to edema Skin: Excoriated skin noted in her groin area Lymph nodes: Cervical, supraclavicular, and axillary nodes normal. Neurologic: Alert.  No focal neurological deficits.   Labs on Admission: I have personally reviewed following labs and imaging studies  CBC: Recent Labs  Lab 12/09/18 0903 12/14/18 1131 12/15/18 1323  WBC 13.2* 1.6* 2.2*  NEUTROABS 12.5* 1.0*  --   HGB 8.7* 5.9* 8.0*  HCT 28.7* 18.5* 25.3*  MCV 94.4 91.6 91.7  PLT 421* 125* 93*   Basic Metabolic Panel: Recent Labs  Lab 12/09/18 0903 12/14/18 1131 12/15/18 1323  NA 135 132* 124*  K 4.1 4.1 3.0*  CL 104 97* 100  CO2 20* 18* 15*  GLUCOSE 131* 143* 146*  BUN 43* 42* 47*  CREATININE 1.60* 2.90* 3.44*  CALCIUM 8.5* 8.7* 6.3*  MG  --  2.0  --    GFR: Estimated Creatinine Clearance: 18 mL/min (A) (by C-G formula based on SCr of 3.44 mg/dL (H)). Liver Function Tests: Recent Labs  Lab 12/09/18 0903 12/14/18 1131 12/15/18 1323  AST 18 114* 44*  ALT 12 46* 31  ALKPHOS 81 246* 139*  BILITOT 0.8 4.0* 4.6*  PROT 7.1 7.2  5.8*  ALBUMIN 2.8* 2.1* 2.0*   Recent Labs  Lab 12/15/18 1323  LIPASE 23    Radiological Exams on Admission: Dg Chest Port 1 View  Result Date: 12/15/2018 CLINICAL DATA:  Patient with diarrhea.  Back pain. EXAM: PORTABLE CHEST 1 VIEW COMPARISON:  Chest  radiograph 01/25/2010 FINDINGS: Monitoring leads overlie the patient. Right anterior chest wall Port-A-Cath is present with tip projecting over the superior vena cava. Cardiomegaly. Bilateral interstitial pulmonary opacities. Low lung volumes. No large area pulmonary consolidation. No pleural effusion or pneumothorax. IMPRESSION: Bilateral interstitial opacities may represent mild pulmonary edema. Electronically Signed   By: Lovey Newcomer M.D.   On: 12/15/2018 14:34    My interpretation of Electrocardiogram: Sinus tachycardia at 119 bpm.  Normal axis.  Intervals are normal.  No concerning ST or T wave changes.   Problem List  Active Problems:   Primary cervical cancer with metastasis - Stage IV   HIV (human immunodeficiency virus infection) ([place])   Pancytopenia, acquired ([place])   Hydronephrosis   Port-A-Cath in place   AKI (acute kidney injury) ([place])   Sepsis ([place])   Complicated UTI (urinary tract infection)   Acute diarrhea   Hyperbilirubinemia   Metabolic acidosis   Assessment: This is a 53 year old African-American female with past medical history as stated earlier who comes in with acute diarrhea.  She is found to be hypotensive tachycardic.  She has pancytopenia.  She appears to have sepsis from most likely an intra-abdominal source.  Could be her urinary tract.  She does have bilateral nephrostomy tubes.  Plan:  1. Sepsis most likely due to intra-abdominal source: Probably from her urinary tract.  Even though she had diarrhea she has not had any since she has been in the ED.  C. difficile although possibility remains less likely. She has bilateral nephrostomies.  Her UA was noted to be remarkably abnormal.  Recently done urine  cultures grew multiple organisms including enterococcus, Pseudomonas, Proteus.  Sensitivities reviewed.  She will be placed on vancomycin and Zosyn for now.  CT scan of the abdomen pelvis will be ordered.  Blood pressure has improved with aggressive IV hydration.  She will be monitored in the stepdown unit.  She also received steroids with her chemotherapeutic regimen.  So she will be given stress dose steroids in the form of hydrocortisone.  Lactic acid level was reassuringly normal.  Check procalcitonin.  Chest x-ray report reviewed.  Patient denies any respiratory symptoms currently.  She saturating normal on room air.  2. Acute renal failure with normal anion gap metabolic acidosis/hydronephrosis/status post bilateral percutaneous nephrostomy: Baseline seems to be less than 2. Creatinine noted to be 3.44 with a BUN of 47.  This likely is multifactorial.  UA reviewed.  She also has hyponatremia.  She will be aggressively hydrated with normal saline.  She has a history of hydronephrosis however she has bilateral nephrostomies.  It looks like these were exchanged out on 12/10/2018. CT scan will be ordered.  Monitor urine output from the nephrostomies.  She will be given sodium bicarbonate.  3. Hyponatremia and hypokalemia: Monitor electrolytes closely.   4. Hyperbilirubinemia: This will be fractionated.  Reason for this is not entirely clear we will follow-up on the CT scan.  AST ALT minimally abnormal.  5.  Pancytopenia: Most likely due to chemotherapy.  She last received chemotherapy on January 30.  We will do a differential to check a neutrophil count.  Her neutrophil count was 1000 yesterday when checked in the cancer center.  We will give her a dose of Granix.  Avoid heparin products.  Transfuse as needed.  Monitor platelet counts closely.  Platelet could also be due to low due to sepsis.  She did get blood transfusion yesterday in the cancer center.  6. Stage IV cervical  cancer: Followed by oncology.   Receiving chemotherapy with last dose given on January 30.  Oncology will be notified of this hospitalization.  It appears that palliative care has also been involved.  We will consult them as well.  7. History of HIV: Continue with antiretroviral treatment  8.  Cancer associated pain: Patient is noted to be on long-acting and short acting morphine.  Resume long acting Morphine from tomorrow.  Use intravenous morphine for acute pain.  Avoid excessive use due to hypotension.  9. Acute diarrhea: Most likely due to chemotherapy.  She has not had any loose stools since she has been in the ED.  C. difficile has been ordered appropriately by the ED provider.  If she does not have any more diarrhea this may be canceled.  10.  Bilateral lower extremity edema: Check venous Dopplers.  Could be due to hypoalbuminemia.   DVT Prophylaxis: If her venous Dopplers are negative for DVT then could add SCDs or TED stockings. Code Status: Previously has been DNR but patient wishes to be full code today. Family Communication: Discussed with the patient Consults called: Oncology notified via epic  Severity of Illness: The appropriate patient status for this patient is INPATIENT. Inpatient status is judged to be reasonable and necessary in order to provide the required intensity of service to ensure the patient's safety. The patient's presenting symptoms, physical exam findings, and initial radiographic and laboratory data in the context of their chronic comorbidities is felt to place them at high risk for further clinical deterioration. Furthermore, it is not anticipated that the patient will be medically stable for discharge from the hospital within 2 midnights of admission. The following factors support the patient status of inpatient.   " The patient's presenting symptoms include acute diarrhea, lightheadedness. " The worrisome physical exam findings include hypotension. " The initial radiographic and laboratory  data are worrisome because of pancytopenia, acute renal failure. " The chronic co-morbidities include HIV, metastatic cervical cancer.   * I certify that at the point of admission it is my clinical judgment that the patient will require inpatient hospital care spanning beyond 2 midnights from the point of admission due to high intensity of service, high risk for further deterioration and high frequency of surveillance required.*  Further management decisions will depend on results of further testing and patient's response to treatment.   Mckenlee Mangham [employer]  [facility] operational officer on [email or web address]  12/15/2018, 5:44 PM

## 2018-12-15 NOTE — ED Triage Notes (Signed)
Pt c/o diarrhea that started this morning. Reports is currently on antibiotics. Pt also having back pains

## 2018-12-15 NOTE — ED Notes (Signed)
CRITICAL VALUE STICKER  CRITICAL VALUE: Calcium of 6.3  RECEIVER (on-site recipient of call): Misty Thomas, [place] NOTIFIED: 12/15/2018 @ 14:26  MESSENGER (representative from lab):   MD NOTIFIED: Misty Moras, PA  TIME OF NOTIFICATION: 14:27  RESPONSE: PA acknowledged lab value.

## 2018-12-15 NOTE — ED Provider Notes (Signed)
Medical screening examination/treatment/procedure(s) were conducted as a shared visit with non-physician practitioner(s) and myself.  I personally evaluated the patient during the encounter.  None Patient with complex medical history including HIV, diabetes, stage IV cervical cancer with metastasis.  Patient has had multiple complications.  She has had chronic diarrhea and nephrostomy tubes placed.  Nephrostomy tube exchange done on 2\3 after diagnosis of UTI.  Today, patient reports she has fever chills worsening abdominal pain.  She feels increasingly weak.  Patient has moderately ill and deconditioned appearance.  Her mental status is clear.  No respiratory distress.  Heart is mild borderline tachycardia.  Lungs, breath sounds are soft.  No gross rales or rhonchi.  Patient's abdomen is diffusely and mildly distended.  He does not have guarding but endorses diffuse lower abdominal discomfort to palpation.  Patient has erythema and induration of medial thigh particularly on the right.  There is a small ulcer wound that appears to be in a state of healing.  Patient's husband and patient report that the appearance of this is actually much better than it had been previously.  Patient has significant edema bilateral lower legs right greater than left.  Patient has multiple severe comorbid illnesses.  At this time concern is for sepsis with hypotension and tachycardia.  Patient has recent diagnosis of urinary tract infection and nephrostomy tubes.  She has been treated with Augmentin.  I agree with empiric sepsis management.  Patient's blood pressures have improved to the mid 90s to low 100s and heart rate has decreased to the low 100s.  We will continue with management and patient will need admission.    Charlesetta Shanks, MD 12/15/18 [phone]

## 2018-12-15 NOTE — ED Notes (Signed)
Bed: MP53 Expected date:  Expected time:  Means of arrival:  Comments: Room 24

## 2018-12-15 NOTE — ED Provider Notes (Signed)
[facility] DEPT Provider Note   CSN: [id number] Arrival date & time: 12/15/18  1210     History   Chief Complaint Chief Complaint  Patient presents with  . Diarrhea    HPI Misty Thomas is a 53 y.o. female.  The history is provided by the patient and medical records. No language interpreter was used.  Diarrhea    53 year old female with history of HIV, diabetes, cervical cancer presenting for evaluation of abdominal pain diarrhea.  Patient is a poor historian. Patient recently seen in the ED 6 days ago when her left nephrostomy tube was she was diagnosed with a urinary tract infection.  She was placed on Augmentin.   She had her nephrostomy tube replaced by IR the next day. She was also received blood transfusion yesterday from her oncologist office.  Today patient endorses fever, chills, abdominal discomfort, as well as having 2 episodes of loose stools with mucus.  She endorsed feeling weak and tired.  Pain is mild to moderate which is not unusual for her.  Does endorse shortness of breath which is not new.  Does not complain of any nausea or vomiting.  Denies any urinary symptoms.  She has 2 nephrostomy tube.  Past Medical History:  Diagnosis Date  . Acquired pancytopenia ([place]) 03/2018  . Anemia    Mild  . Cancer associated pain   . 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 Chemotherapy induced nausea and vomiting   . Diabetes mellitus type 2, diet-controlled ([place])    pt. denies No meds  . Frequency of urination   . History of cellulitis 2018   bilateral lower leg w/ mrsa  . History of external beam radiation therapy 02-05-2018  to 03-16-2018   cervical cancer  . History of MRSA infection 2018   w/  bilateral lower leg cellulitis  . HIV (human immunodeficiency virus infection) ([place])    asymptomatic  . Hypomagnesemia 03/2018   Severe---- takes oral magnesium and IV magnesium as needed (cancer center)  . Intermittent diarrhea    due to radiation/ chemo  . Nocturia   . Port-A-Cath in place    Power port  . Radiation burn    LOWER ABD.  04-06-2018  per is healing  . Subclinical hypothyroidism    w/ thyroiditis , dx 01-22-2018 PET scan  . Thrombocytopenia ([place])   . Wears glasses     Patient Active Problem List   Diagnosis Date Noted  . Bilateral leg edema 11/26/2018  . Weakness generalized   . DNR (do not resuscitate)   . Perirectal abscess   . Goals of care, counseling/discussion   . AKI (acute kidney injury) ([place]) 10/31/2018  . Palliative care by specialist   . 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 Subclinical hypothyroidism   . Port-A-Cath in place   . History of external beam radiation therapy   . Back pain 10/27/2018  . Presacral pelvic abscess from necrotic cervical cancer & rectovaginal fistula 10/26/2018  . DNR (do not resuscitate) discussion 10/15/2018  . Hydronephrosis 10/10/2018  . Other constipation 07/20/2018  . Preventive measure 07/20/2018  . Routine screening for STI (sexually transmitted infection) 07/02/2018  . Vaccine counseling 07/02/2018  . Hypomagnesemia 02/22/2018  . Pancytopenia, acquired ([place]) 02/22/2018  .  Chemotherapy-induced nausea 02/15/2018  . Diarrhea 02/15/2018  . HIV (human immunodeficiency virus infection) ([place]) 02/09/2018  . Acquired hypothyroidism 02/08/2018  . Anemia, blood loss 02/08/2018  . Abnormal thyroid uptake 01/25/2018  . Cancer associated pain 01/25/2018  . Diabetes mellitus without complication ([place]) 35/57/3220  . Abnormal vaginal bleeding   . Primary cervical cancer with metastasis - Stage IV 01/04/2018    Past Surgical History:  Procedure Laterality Date  . CYSTOSCOPY     retrograde pyelogram  right ureteral stent placement  . CYSTOSCOPY W/ URETERAL STENT PLACEMENT Bilateral 10/24/2018   Procedure: CYSTOSCOPY WITH BILATERAL RETROGRADE PYELOGRAM RIGHT Wyvonnia Dusky STENT PLACEMENT;  Surgeon: Ardis Hughs, MD;  Location: [facility];  Service: Urology;  Laterality: Bilateral;  . EUA/ CERVICAL BX  01-04-2018   dr Ihor Dow  [facility]  . IR FLUORO GUIDE PORT INSERTION RIGHT  02/01/2018  . IR NEPHROSTOMY EXCHANGE LEFT  12/10/2018  . IR NEPHROSTOMY EXCHANGE RIGHT  12/10/2018  . IR NEPHROSTOMY PLACEMENT LEFT  11/04/2018  . IR NEPHROSTOMY PLACEMENT RIGHT  11/04/2018  . TANDEM RING INSERTION N/A 03/20/2018   Procedure: TANDEM RING INSERTION;  Surgeon: Gery Pray, MD;  Location: [facility];  Service: Urology;  Laterality: N/A;  . TANDEM RING INSERTION N/A 03/26/2018   Procedure: TANDEM RING INSERTION;  Surgeon: Gery Pray, MD;  Location: [facility];  Service: Urology;  Laterality: N/A;  . TANDEM RING INSERTION N/A 04/04/2018   Procedure: TANDEM RING INSERTION;  Surgeon: Gery Pray, MD;  Location[facility];  Service: Urology;  Laterality: N/A;  . TANDEM RING INSERTION N/A 04/12/2018   Procedure: TANDEM RING INSERTION;  Surgeon: Gery Pray, MD;  Location: [facility];  Service: Urology;  Laterality: N/A;  . TANDEM RING INSERTION N/A 04/18/2018   Procedure: TANDEM RING INSERTION;  Surgeon: Gery Pray, MD;  Location: [facility];  Service: Urology;  Laterality: N/A;  . TUBAL LIGATION Bilateral 1990s     OB History    Gravida  5   Para  3   Term  3   Preterm      AB  2   Living  3     SAB      TAB  2   Ectopic      Multiple      Live Births               Home Medications    Prior to Admission medications   Medication Sig Start Date End Date Taking? Authorizing Provider  acetaminophen (TYLENOL) 500 MG tablet Take 500 mg by mouth every 6 (six) hours as needed for mild pain.    [provider]  amoxicillin-clavulanate (AUGMENTIN) 875-125 MG tablet Take 1 tablet by mouth 2 (two) times daily. One po bid x 7 days 12/09/18   Daleen Bo, MD  BIKTARVY 50-200-25 MG TABS tablet TAKE 1 TABLET BY MOUTH DAILY. 09/14/18   Comer, Okey Regal, MD  dexamethasone (DECADRON) 4 MG tablet Take 4 mg by mouth as directed. Take 5 pills the night before and 5 pills the morning of chemo, every 3 weeks    [provider]  furosemide (LASIX) 20 MG tablet Take 20 mg by mouth daily as needed for edema.    [provider]  levothyroxine (SYNTHROID, LEVOTHROID) 25 MCG tablet Take 1 tablet (25 mcg total) by mouth daily before breakfast. 02/08/18   [name], MD  lidocaine-prilocaine (EMLA) cream Apply 1 application topically as  needed. 11/15/18   [name], MD  loperamide (IMODIUM) 2 MG capsule Take 2 mg by mouth as needed for diarrhea or loose stools.    [provider]  LORazepam (ATIVAN) 0.5 MG tablet Take 1 tablet (0.5 mg total) by mouth every 6 (six) hours as needed for anxiety. 11/09/18   Nita Sells, MD  magnesium oxide (MAG-OX) 400 (241.3 Mg) MG tablet Take 1 tablet (400 mg total) by mouth 2 (two) times daily. 03/08/18   [name], MD  morphine (MS CONTIN) 60 MG 12 hr tablet Take 1 tablet (60 mg total) by mouth every 12 (twelve) hours. 11/23/18   [name], MD  morphine (MSIR) 30 MG tablet Take 1 tablet (30 mg total) by mouth every 6 (six) hours as needed for severe pain. 11/23/18   [name], MD  ondansetron (ZOFRAN) 8 MG tablet Take 1 tablet (8 mg total) by mouth every 8 (eight) hours as needed. 12/06/18   [name], MD  phenazopyridine (PYRIDIUM) 200 MG tablet Take 1 tablet (200 mg total) by mouth 3 (three) times daily as needed for pain. Patient not taking: Reported on 12/09/2018 10/24/18   Ardis Hughs, MD  polyethylene glycol [facility] Floria Raveling) packet Take 17 g by mouth daily.    [provider]  prochlorperazine (COMPAZINE) 10 MG tablet  Take 10 mg by mouth every 6 (six) hours as needed.    [provider]    Family History Family History  Problem Relation Age of Onset  . Cancer Maternal Grandmother        unknown ca    Social History Social History   Tobacco Use  . Smoking status: Never Smoker  . Smokeless tobacco: Never Used  Substance Use Topics  . Alcohol use: Not Currently    Frequency: Never  . Drug use: No     Allergies   Patient has no known allergies.   Review of Systems Review of Systems  Gastrointestinal: Positive for diarrhea.  All other systems reviewed and are negative.    Physical Exam Updated Vital Signs BP (!) 82/57 (BP Location: Left Arm)   Pulse (!) 130   Temp 99.5 F (37.5 C) (Oral)   Resp 20   LMP 02/03/2018   SpO2 100%   Physical Exam Vitals signs and nursing note reviewed. Exam conducted with a chaperone present.  Constitutional:      General: She is not in acute distress.    Appearance: She is well-developed. She is ill-appearing.  HENT:     Head: Atraumatic.  Eyes:     General: Scleral icterus present.     Conjunctiva/sclera: Conjunctivae normal.  Neck:     Musculoskeletal: Neck supple.  Cardiovascular:     Rate and Rhythm: Tachycardia present.     Pulses: Normal pulses.     Heart sounds: Normal heart sounds.  Pulmonary:     Effort: No respiratory distress.     Breath sounds: No wheezing or rhonchi.  Abdominal:     Palpations: Abdomen is soft.     Tenderness: There is abdominal tenderness (Mild diffuse tenderness without guarding or rebound tenderness.).  Genitourinary:    Comments:  skin changes to bilateral groin R>L Skin:    General: Skin is warm.     Findings: No rash.     Comments: Bilateral nephrostomy tubes, incision site normal-appearing.  Neurological:     Mental Status: She is alert and oriented to person, place, and time.      ED  Treatments / Results  Labs (all labs ordered are listed, but only abnormal results are  displayed) Labs Reviewed  COMPREHENSIVE METABOLIC PANEL - Abnormal; Notable for the following components:      Result Value   Sodium 124 (*)    Potassium 3.0 (*)    CO2 15 (*)    Glucose, Bld 146 (*)    BUN 47 (*)    Creatinine, Ser 3.44 (*)    Calcium 6.3 (*)    Total Protein 5.8 (*)    Albumin 2.0 (*)    AST 44 (*)    Alkaline Phosphatase 139 (*)    Total Bilirubin 4.6 (*)    GFR calc non Af Amer 15 (*)    GFR calc Af Amer 17 (*)    All other components within normal limits  CBC - Abnormal; Notable for the following components:   WBC 2.2 (*)    RBC 2.76 (*)    Hemoglobin 8.0 (*)    HCT 25.3 (*)    RDW 16.0 (*)    Platelets 93 (*)    All other components within normal limits  URINALYSIS, ROUTINE W REFLEX MICROSCOPIC - Abnormal; Notable for the following components:   Color, Urine BROWN (*)    APPearance TURBID (*)    Hgb urine dipstick LARGE (*)    Bilirubin Urine LARGE (*)    Ketones, ur TRACE (*)    Protein, ur >300 (*)    Nitrite POSITIVE (*)    Leukocytes, UA MODERATE (*)    All other components within normal limits  URINALYSIS, MICROSCOPIC (REFLEX) - Abnormal; Notable for the following components:   Bacteria, UA MANY (*)    All other components within normal limits  I-STAT BETA HCG BLOOD, ED ([facility], [facility], [facility] ONLY) - Abnormal; Notable for the following components:   I-stat hCG, quantitative 10.1 (*)    All other components within normal limits  CULTURE, BLOOD (ROUTINE X 2)  CULTURE, BLOOD (ROUTINE X 2)  URINE CULTURE  C DIFFICILE QUICK SCREEN W PCR REFLEX  LIPASE, BLOOD  LACTIC ACID, PLASMA  LACTIC ACID, PLASMA    EKG None  Radiology Dg Chest Port 1 View  Result Date: 12/15/2018 CLINICAL DATA:  Patient with diarrhea.  Back pain. EXAM: PORTABLE CHEST 1 VIEW COMPARISON:  Chest radiograph 01/25/2010 FINDINGS: Monitoring leads overlie the patient. Right anterior chest wall Port-A-Cath is present with tip projecting over the superior vena cava. Cardiomegaly.  Bilateral interstitial pulmonary opacities. Low lung volumes. No large area pulmonary consolidation. No pleural effusion or pneumothorax. IMPRESSION: Bilateral interstitial opacities may represent mild pulmonary edema. Electronically Signed   By: Lovey Newcomer M.D.   On: 12/15/2018 14:34    Procedures .Critical Care Performed by: Domenic Moras, PA-C Authorized by: Domenic Moras, PA-C   Critical care provider statement:    Critical care time (minutes):  45   Critical care was time spent personally by me on the following activities:  Discussions with consultants, evaluation of patient's response to treatment, examination of patient, ordering and performing treatments and interventions, ordering and review of laboratory studies, ordering and review of radiographic studies, pulse oximetry, re-evaluation of patient's condition, obtaining history from patient or surrogate and review of old charts   (including critical care time)  Medications Ordered in ED Medications  lactated ringers bolus 1,000 mL (1,000 mLs Intravenous New Bag/Given 12/15/18 1622)  ceFEPIme (MAXIPIME) 2 g in sodium chloride 0.9 % 100 mL IVPB (0 g Intravenous Stopped 12/15/18 1506)  lactated  ringers bolus 1,000 mL (0 mLs Intravenous Stopped 12/15/18 1501)     Initial Impression / Assessment and Plan / ED Course  I have reviewed the triage vital signs and the nursing notes.  Pertinent labs & imaging results that were available during my care of the patient were reviewed by me and considered in my medical decision making (see chart for details).     BP 90/62 (BP Location: Right Arm)   Pulse (!) 116   Temp 100 F (37.8 C) (Rectal)   Resp 19   Ht 5\' 1"  (1.549 m)   Wt 77 kg   LMP 02/03/2018   SpO2 100%   BMI 32.07 kg/m    Final Clinical Impressions(s) / ED Diagnoses   Final diagnoses:  Sepsis, due to unspecified organism, unspecified whether acute organ dysfunction present [facility])  Acute lower UTI    ED Discharge Orders     None     1:28 PM Patient here with complaint of abdominal pain and associated diarrhea.  She also endorses fever and chills.  Patient is found to be hypotensive and tachycardic concerning for potential sepsis.  She was diagnosed with a urinary tract infection 6 days prior was treated with Augmentin.  Given recent antibiotic and diarrhea, will consider C. difficile as a potential causative agent.  Recent nephrostomy tube replacement there is increased risk of kidney infection.  Work-up initiated, sepsis initiated.  4:47 PM Urine with finding concerning for UTI.  Urine culture sent.  Initial lactic acid is normal at 1.1. patient has low calcium of 6.3.  Sodium and potassium are also low with a potassium of 3.0.  Evidence of chronic kidney disease with creatinine of 3.44.  Hemoglobin is 8.0, chest x-ray shows interstitial opacity representing mild pulmonary edema.  Code sepsis including IV hydration, antibiotics, and close monitoring.  Will consult for admission.  4:58 PM I have consulted with [facility] Dr. Josem Kaufmann who agrees to see and admit pt for further care.   Sepsis reassessment done.   Domenic Moras, PA-C 12/15/18 1702    Charlesetta Shanks, MD 12/17/18 1019

## 2018-12-15 NOTE — Progress Notes (Signed)
Pharmacy Antibiotic Note  Misty Thomas is a 53 y.o. female admitted on 12/15/2018 with sepsis from suspected intra-abdominal source.  Pharmacy has been consulted for vancomycin and zosyn dosing.  Plan:  Vancomycin 1500mg  IV x 1, check level in ~48hr if therapy continues  Zosyn 2.25g IV q8h for CrCl < 20 ml/min  Follow up renal function & cultures  Height: 5\' 1"  (154.9 cm) Weight: 169 lb 12.1 oz (77 kg) IBW/kg (Calculated) : 47.8  Temp (24hrs), Avg:99.8 F (37.7 C), Min:99.5 F (37.5 C), Max:100 F (37.8 C)  Recent Labs  Lab 12/09/18 0903 12/09/18 0928 12/09/18 1128 12/14/18 1131 12/15/18 1323 12/15/18 1341 12/15/18 1534  WBC 13.2*  --   --  1.6* 2.2*  --   --   CREATININE 1.60*  --   --  2.90* 3.44*  --   --   LATICACIDVEN  --  1.7 0.8  --   --  1.4 1.1    Estimated Creatinine Clearance: 18 mL/min (A) (by C-G formula based on SCr of 3.44 mg/dL (H)).    No Known Allergies  Antimicrobials this admission: 2/8 Cefepime x 1 2/8 Vancomycin >> 2/8 Zosyn >>  Dose adjustments this admission:   Microbiology results: 2/8 BCx: sent 2/8 UCx: sent 2/8 C.diff: sent  Thank you for allowing pharmacy to be a part of this patient's care.  Peggyann Juba, PharmD, BCPS Pager: [phone] 12/15/2018 5:56 PM

## 2018-12-16 ENCOUNTER — Encounter (HOSPITAL_COMMUNITY): Payer: Self-pay

## 2018-12-16 ENCOUNTER — Other Ambulatory Visit: Payer: Self-pay

## 2018-12-16 ENCOUNTER — Inpatient Hospital Stay (HOSPITAL_COMMUNITY): Payer: Medicaid Other

## 2018-12-16 DIAGNOSIS — N390 Urinary tract infection, site not specified: Secondary | ICD-10-CM

## 2018-12-16 DIAGNOSIS — Z7189 Other specified counseling: Secondary | ICD-10-CM

## 2018-12-16 DIAGNOSIS — Z515 Encounter for palliative care: Secondary | ICD-10-CM

## 2018-12-16 LAB — BLOOD CULTURE ID PANEL (REFLEXED)
Acinetobacter baumannii: NOT DETECTED
CANDIDA GLABRATA: NOT DETECTED
Candida albicans: NOT DETECTED
Candida krusei: NOT DETECTED
Candida parapsilosis: NOT DETECTED
Candida tropicalis: NOT DETECTED
Carbapenem resistance: NOT DETECTED
ENTEROBACTERIACEAE SPECIES: NOT DETECTED
Enterobacter cloacae complex: NOT DETECTED
Enterococcus species: NOT DETECTED
Escherichia coli: NOT DETECTED
Haemophilus influenzae: NOT DETECTED
Klebsiella oxytoca: NOT DETECTED
Klebsiella pneumoniae: NOT DETECTED
Listeria monocytogenes: NOT DETECTED
Neisseria meningitidis: NOT DETECTED
Proteus species: NOT DETECTED
Pseudomonas aeruginosa: DETECTED — AB
STREPTOCOCCUS PYOGENES: NOT DETECTED
Serratia marcescens: NOT DETECTED
Staphylococcus aureus (BCID): NOT DETECTED
Staphylococcus species: NOT DETECTED
Streptococcus agalactiae: NOT DETECTED
Streptococcus pneumoniae: NOT DETECTED
Streptococcus species: NOT DETECTED

## 2018-12-16 LAB — COMPREHENSIVE METABOLIC PANEL
ALK PHOS: 131 U/L — AB (ref 38–126)
ALT: 27 U/L (ref 0–44)
AST: 24 U/L (ref 15–41)
Albumin: 1.9 g/dL — ABNORMAL LOW (ref 3.5–5.0)
Anion gap: 14 (ref 5–15)
BUN: 52 mg/dL — ABNORMAL HIGH (ref 6–20)
CO2: 17 mmol/L — AB (ref 22–32)
Calcium: 7.6 mg/dL — ABNORMAL LOW (ref 8.9–10.3)
Chloride: 99 mmol/L (ref 98–111)
Creatinine, Ser: 3.93 mg/dL — ABNORMAL HIGH (ref 0.44–1.00)
GFR calc Af Amer: 14 mL/min — ABNORMAL LOW (ref >60)
GFR calc non Af Amer: 12 mL/min — ABNORMAL LOW (ref >60)
Glucose, Bld: 121 mg/dL — ABNORMAL HIGH (ref 70–99)
Potassium: 4.2 mmol/L (ref 3.5–5.1)
SODIUM: 130 mmol/L — AB (ref 135–145)
Total Bilirubin: 4.1 mg/dL — ABNORMAL HIGH (ref 0.3–1.2)
Total Protein: 5.8 g/dL — ABNORMAL LOW (ref 6.5–8.1)

## 2018-12-16 LAB — PROTIME-INR
INR: 1.42
Prothrombin Time: 17.2 seconds — ABNORMAL HIGH (ref 11.4–15.2)

## 2018-12-16 LAB — CBC WITH DIFFERENTIAL/PLATELET
Abs Immature Granulocytes: 0.03 10*3/uL (ref 0.00–0.07)
Basophils Absolute: 0 10*3/uL (ref 0.0–0.1)
Basophils Relative: 1 %
Eosinophils Absolute: 0 10*3/uL (ref 0.0–0.5)
Eosinophils Relative: 0 %
HCT: 28.9 % — ABNORMAL LOW (ref 36.0–46.0)
HEMOGLOBIN: 9.1 g/dL — AB (ref 12.0–15.0)
Immature Granulocytes: 1 %
Lymphocytes Relative: 7 %
Lymphs Abs: 0.2 10*3/uL — ABNORMAL LOW (ref 0.7–4.0)
MCH: 28.4 pg (ref 26.0–34.0)
MCHC: 31.5 g/dL (ref 30.0–36.0)
MCV: 90.3 fL (ref 80.0–100.0)
Monocytes Absolute: 0.2 10*3/uL (ref 0.1–1.0)
Monocytes Relative: 5 %
Neutro Abs: 2.7 10*3/uL (ref 1.7–7.7)
Neutrophils Relative %: 86 %
Platelets: 79 10*3/uL — ABNORMAL LOW (ref 150–400)
RBC: 3.2 MIL/uL — ABNORMAL LOW (ref 3.87–5.11)
RDW: 16.1 % — ABNORMAL HIGH (ref 11.5–15.5)
WBC: 3.1 10*3/uL — ABNORMAL LOW (ref 4.0–10.5)
nRBC: 0 % (ref 0.0–0.2)

## 2018-12-16 LAB — BILIRUBIN, FRACTIONATED(TOT/DIR/INDIR)
Bilirubin, Direct: 2.3 mg/dL — ABNORMAL HIGH (ref 0.0–0.2)
Indirect Bilirubin: 1.4 mg/dL — ABNORMAL HIGH (ref 0.3–0.9)
Total Bilirubin: 3.7 mg/dL — ABNORMAL HIGH (ref 0.3–1.2)

## 2018-12-16 LAB — PROCALCITONIN: Procalcitonin: 63.05 ng/mL

## 2018-12-16 LAB — LACTIC ACID, PLASMA
Lactic Acid, Venous: 0.8 mmol/L (ref 0.5–1.9)
Lactic Acid, Venous: 0.4 mmol/L — ABNORMAL LOW (ref 0.5–1.9)

## 2018-12-16 LAB — MRSA PCR SCREENING: MRSA by PCR: NEGATIVE

## 2018-12-16 MED ORDER — PIPERACILLIN-TAZOBACTAM IN DEX 2-0.25 GM/50ML IV SOLN
2.2500 g | Freq: Four times a day (QID) | INTRAVENOUS | Status: DC
  Administered 2018-12-16 – 2018-12-18 (×7): 2.25 g via INTRAVENOUS
  Filled 2018-12-16 (×8): qty 50

## 2018-12-16 MED ORDER — MORPHINE SULFATE ER 30 MG PO TBCR
60.0000 mg | EXTENDED_RELEASE_TABLET | Freq: Two times a day (BID) | ORAL | Status: DC
  Administered 2018-12-16 (×2): 60 mg via ORAL
  Filled 2018-12-16: qty 2
  Filled 2018-12-16: qty 4

## 2018-12-16 MED ORDER — HYDROMORPHONE HCL 1 MG/ML IJ SOLN
0.5000 mg | INTRAMUSCULAR | Status: DC | PRN
  Administered 2018-12-19: 0.5 mg via INTRAVENOUS
  Filled 2018-12-16: qty 1

## 2018-12-16 MED ORDER — LEVOTHYROXINE SODIUM 25 MCG PO TABS
25.0000 ug | ORAL_TABLET | Freq: Every day | ORAL | Status: DC
  Administered 2018-12-16 – 2018-12-19 (×4): 25 ug via ORAL
  Filled 2018-12-16 (×4): qty 1

## 2018-12-16 MED ORDER — SODIUM CHLORIDE 0.9 % IV SOLN: INTRAVENOUS | Status: DC | PRN

## 2018-12-16 MED ORDER — ACETAMINOPHEN 325 MG PO TABS
650.0000 mg | ORAL_TABLET | Freq: Four times a day (QID) | ORAL | Status: DC | PRN
  Administered 2018-12-16 (×2): 650 mg via ORAL
  Filled 2018-12-16 (×2): qty 2

## 2018-12-16 MED ORDER — ACETAMINOPHEN 650 MG RE SUPP: 650.0000 mg | Freq: Four times a day (QID) | RECTAL | Status: DC | PRN

## 2018-12-16 MED ORDER — SODIUM BICARBONATE 8.4 % IV SOLN
100.0000 meq | Freq: Once | INTRAVENOUS | Status: AC
  Administered 2018-12-16: 100 meq via INTRAVENOUS
  Filled 2018-12-16: qty 50

## 2018-12-16 MED ORDER — SODIUM CHLORIDE 0.9 % IV SOLN
INTRAVENOUS | Status: DC
  Administered 2018-12-16 – 2018-12-19 (×7): via INTRAVENOUS

## 2018-12-16 MED ORDER — VANCOMYCIN HCL IN DEXTROSE 1-5 GM/200ML-% IV SOLN: 1000.0000 mg | Freq: Once | INTRAVENOUS | Status: DC

## 2018-12-16 MED ORDER — HYDROCORTISONE NA SUCCINATE PF 100 MG IJ SOLR
100.0000 mg | Freq: Four times a day (QID) | INTRAMUSCULAR | Status: DC
  Administered 2018-12-16 – 2018-12-19 (×15): 100 mg via INTRAVENOUS
  Filled 2018-12-16 (×15): qty 2

## 2018-12-16 MED ORDER — BICTEGRAVIR-EMTRICITAB-TENOFOV 50-200-25 MG PO TABS
1.0000 | ORAL_TABLET | Freq: Every day | ORAL | Status: DC
  Administered 2018-12-16 – 2018-12-17 (×2): 1 via ORAL
  Filled 2018-12-16 (×2): qty 1

## 2018-12-16 MED ORDER — TBO-FILGRASTIM 480 MCG/0.8ML ~~LOC~~ SOSY: 480.0000 ug | PREFILLED_SYRINGE | Freq: Once | SUBCUTANEOUS | Status: DC

## 2018-12-16 NOTE — Consult Note (Signed)
Consultation Note Date: 12/16/2018   Patient Name: Misty Thomas  DOB: 1966/10/22  MRN: [id number]  Age / Sex: 53 y.o., female  PCP: Patient, No Pcp Per Referring Physician: Bonnielee Haff, MD  Reason for Consultation: Establishing goals of care  HPI/Patient Profile: 53 y.o. female   admitted on 12/15/2018    Clinical Assessment and Goals of Care: 53 y.o.femalewith a past medical history of stage IV cervical cancer receiving chemotherapy, last chemotherapy was on January 30,h/oHIV on antiretroviral treatment, hydronephrosis, status post ureteral stent, status post bilateral percutaneous nephrostomy who presented with complaints ofdiarrhea that started on the morning of admission. She had 2 episodes of watery stool. She felt weak and lightheaded. In the emergency department patient was noted to be hypotensive with a blood pressureinitiallyin the 60s. Evaluation revealed an abnormal UA. She was noted to be pancytopenic. There was concern for infection in the urinary tract. She was also found to have acute renal failure.  Patient has been admitted to hospital medicine with sepsis, UTI, acute renal failure. She was seen by PMT in previous hospitalization.   The patient is resting in bed, she was still in the ED at the time of my initial consultation. I introduced myself and palliative care as follows: Palliative medicine is specialized medical care for people living with serious illness. It focuses on providing relief from the symptoms and stress of a serious illness. The goal is to improve quality of life for both the patient and the family.  The patient stated that she might have had some diarrhea overnight, denied dizziness and noted that she was feeling some what better since admission.   We discussed about her pain and non pain symptom management medications, discussed that CT scans and blood work  are being followed closely.   See below.   NEXT OF KIN Husband and daughter.    SUMMARY OF RECOMMENDATIONS    full code, full scope Will opioid rotate from IV Morphine to IV Dilaudid due to her renal indices, for now, continue with current PO opioid regimen, will have to make adjustments if ongoing renal failure.  Continue current mode of care Thank you for the consult.   Code Status/Advance Care Planning:  Full code    Symptom Management:      Palliative Prophylaxis:   Bowel Regimen  Additional Recommendations (Limitations, Scope, Preferences):  Full Scope Treatment  Psycho-social/Spiritual:   Desire for further Chaplaincy support:yes  Additional Recommendations: Caregiving  Support/Resources  Prognosis:   Unable to determine  Discharge Planning: To Be Determined      Primary Diagnoses: Present on Admission: . Sepsis ([place]) . Complicated UTI (urinary tract infection) . Acute diarrhea . Primary cervical cancer with metastasis - Stage IV . AKI (acute kidney injury) ([place]) . HIV (human immunodeficiency virus infection) ([place]) . Hyperbilirubinemia . Pancytopenia, acquired ([place]) . Metabolic acidosis . Hydronephrosis   I have reviewed the medical record, interviewed the patient and family, and examined the patient. The following aspects are pertinent.  Past Medical History:  Diagnosis  Date  . Acquired pancytopenia ([place]) 03/2018  . Anemia    Mild  . Cancer associated pain   . Cervical cancer [facility]) oncologist-  dr gorsuch/  dr Sondra Come   dx 01-04-2017-- Stage IIIB,  cercial adenocarcinoma w/ local invasion perirectal area-- chemo started 02-09-2018 and external beam radiation completed 03/16/2018 ,  started brachytherapy boost high dose radiation 03-20-2018  . Chemotherapy induced nausea and vomiting   . Diabetes mellitus type 2, diet-controlled ([place])    pt. denies No meds  . Frequency of urination   . History of cellulitis 2018   bilateral lower leg w/  mrsa  . History of external beam radiation therapy 02-05-2018  to 03-16-2018   cervical cancer  . History of MRSA infection 2018   w/ bilateral lower leg cellulitis  . HIV (human immunodeficiency virus infection) ([place])    asymptomatic  . Hypomagnesemia 03/2018   Severe---- takes oral magnesium and IV magnesium as needed (cancer center)  . Intermittent diarrhea    due to radiation/ chemo  . Nocturia   . Port-A-Cath in place    Power port  . Radiation burn    LOWER ABD.  04-06-2018  per is healing  . Subclinical hypothyroidism    w/ thyroiditis , dx 01-22-2018 PET scan  . Thrombocytopenia ([place])   . Wears glasses    Social History   Socioeconomic History  . Marital status: Married    Spouse name: [name]  . Number of children: 3  . Years of education: Not on file  . Highest education level: Not on file  Occupational History  . Not on file  Social Needs  . Financial resource strain: Somewhat hard  . Food insecurity:    Worry: Sometimes true    Inability: Sometimes true  . Transportation needs:    Medical: No    Non-medical: No  Tobacco Use  . Smoking status: Never Smoker  . Smokeless tobacco: Never Used  Substance and Sexual Activity  . Alcohol use: Not Currently    Frequency: Never  . Drug use: No  . Sexual activity: Not Currently    Birth control/protection: Surgical  Lifestyle  . Physical activity:    Days per week: 0 days    Minutes per session: 0 min  . Stress: Rather much  Relationships  . Social connections:    Talks on phone: Once a week    Gets together: Once a week    Attends religious service: Never    Active member of club or organization: No    Attends meetings of clubs or organizations: Not on file    Relationship status: Married  Other Topics Concern  . Not on file  Social History Narrative  . Not on file   Family History  Problem Relation Age of Onset  . Cancer Maternal Grandmother        unknown ca   Scheduled Meds: .  bictegravir-emtricitabine-tenofovir AF  1 tablet Oral Daily  . hydrocortisone sod succinate (SOLU-CORTEF) inj  100 mg Intravenous Q6H  . levothyroxine  25 mcg Oral Q0600  . morphine  60 mg Oral Q12H  . sodium bicarbonate  100 mEq Intravenous Once  . vancomycin variable dose per unstable renal function (pharmacist dosing)   Does not apply See admin instructions   Continuous Infusions: . sodium chloride 125 mL/hr at 12/16/18 1237  . piperacillin-tazobactam (ZOSYN)  IV 2.25 g (12/16/18 0418)   PRN Meds:.acetaminophen **OR** acetaminophen, HYDROmorphone (DILAUDID) injection Medications Prior to Admission:  Prior  to Admission medications   Medication Sig Start Date End Date Taking? Authorizing Provider  acetaminophen (TYLENOL) 500 MG tablet Take 500 mg by mouth every 6 (six) hours as needed for mild pain.   Yes [provider]  amoxicillin-clavulanate (AUGMENTIN) 875-125 MG tablet Take 1 tablet by mouth 2 (two) times daily. One po bid x 7 days 12/09/18  Yes Daleen Bo, MD  BIKTARVY 50-200-25 MG TABS tablet TAKE 1 TABLET BY MOUTH DAILY. 09/14/18  Yes Comer, Okey Regal, MD  dexamethasone (DECADRON) 4 MG tablet Take 4 mg by mouth as directed. Take 5 pills the night before and 5 pills the morning of chemo, every 3 weeks   Yes [provider]  furosemide (LASIX) 20 MG tablet Take 20 mg by mouth daily as needed for edema.   Yes [provider]  levothyroxine (SYNTHROID, LEVOTHROID) 25 MCG tablet Take 1 tablet (25 mcg total) by mouth daily before breakfast. 02/08/18  Yes Gorsuch, Ni, MD  lidocaine-prilocaine (EMLA) cream Apply 1 application topically as needed. 11/15/18  Yes Gorsuch, Ni, MD  loperamide (IMODIUM) 2 MG capsule Take 2 mg by mouth as needed for diarrhea or loose stools.   Yes [provider]  magnesium oxide (MAG-OX) 400 (241.3 Mg) MG tablet Take 1 tablet (400 mg total) by mouth 2 (two) times daily. 03/08/18  Yes Gorsuch, Ni, MD  morphine (MS CONTIN) 60 MG 12 hr  tablet Take 1 tablet (60 mg total) by mouth every 12 (twelve) hours. 11/23/18  Yes Gorsuch, Ni, MD  morphine (MSIR) 30 MG tablet Take 1 tablet (30 mg total) by mouth every 6 (six) hours as needed for severe pain. 11/23/18  Yes Gorsuch, Ni, MD  ondansetron (ZOFRAN) 8 MG tablet Take 1 tablet (8 mg total) by mouth every 8 (eight) hours as needed. 12/06/18  Yes Gorsuch, Ni, MD  polyethylene glycol (MIRALAX / GLYCOLAX) packet Take 17 g by mouth daily.   Yes [provider]  prochlorperazine (COMPAZINE) 10 MG tablet Take 10 mg by mouth every 6 (six) hours as needed.   Yes [provider]  LORazepam (ATIVAN) 0.5 MG tablet Take 1 tablet (0.5 mg total) by mouth every 6 (six) hours as needed for anxiety. Patient not taking: Reported on 12/15/2018 11/09/18   Nita Sells, MD  phenazopyridine (PYRIDIUM) 200 MG tablet Take 1 tablet (200 mg total) by mouth 3 (three) times daily as needed for pain. Patient not taking: Reported on 12/09/2018 10/24/18   Ardis Hughs, MD   No Known Allergies Review of Systems +weakness +diarrhea  Physical Exam Awake alert NAD Generalized weakness S1 S2  Abdomen: bilat nephrostomy tubes, mild gen abd tenderness.  Clear lung sounds No edema   Vital Signs: BP 95/74 (BP Location: Left Arm)   Pulse 81   Temp 97.7 F (36.5 C) (Oral)   Resp 16   Ht 5\' 1"  (1.549 m)   Wt 77 kg   LMP 02/03/2018   SpO2 98%   BMI 32.07 kg/m  Pain Scale: 0-10   Pain Score: 0-No pain   SpO2: SpO2: 98 % O2 Device:SpO2: 98 % O2 Flow Rate: .   IO: Intake/output summary:   Intake/Output Summary (Last 24 hours) at 12/16/2018 1331 Last data filed at 12/16/2018 0342 Gross per 24 hour  Intake 4649.99 ml  Output -  Net 4649.99 ml    LBM: Last BM Date: 12/15/18 Baseline Weight: Weight: 77 kg Most recent weight: Weight: 77 kg     Palliative Assessment/Data:  PPS 40%  Time In:  8.30  Time Out:  9.30 Time Total:  60 min  Greater than 50%  of this time was  spent counseling and coordinating care related to the above assessment and plan.  Signed by: Loistine Chance, MD  [phone] Please contact Palliative Medicine Team phone at [phone] for questions and concerns.  For individual provider: See Shea Evans

## 2018-12-16 NOTE — Progress Notes (Signed)
[facility] PROGRESS NOTE  Misty Thomas EYC:[id number] DOB: 1965-12-28 DOA: 12/15/2018  PCP: Patient, No Pcp Per  Brief History/Interval Summary: 53 y.o. female with a past medical history of stage IV cervical cancer receiving chemotherapy, last chemotherapy was on January 30, h/o HIV on antiretroviral treatment, hydronephrosis, status post ureteral stent, status post bilateral percutaneous nephrostomy who presented with  complaints of diarrhea that started on the morning of admission.  She had 2 episodes of watery stool.  She felt weak and lightheaded.  In the emergency department patient was noted to be hypotensive with a blood pressure initially in the 60s.  Evaluation revealed an abnormal UA.  She was noted to be pancytopenic.  There was concern for infection in the urinary tract.  She was also found to have acute renal failure.  She will need hospitalization for further management.  Reason for Visit: Sepsis secondary to unknown organism  Consultants: Medical oncology.  Procedures: None  Antibiotics: Vancomycin and Zosyn  Subjective/Interval History: Patient states that she feels slightly better this morning.  Not as lightheaded as yesterday.  Continues to have abdominal pain in the right and left upper quadrants as well as in the flank areas.  Denies any nausea vomiting.    ROS: No shortness of breath.  Objective:  Vital Signs  Vitals:   12/16/18 0657 12/16/18 0900 12/16/18 0950 12/16/18 1100  BP: 97/66 93/75 (!) 89/62 92/67  Pulse: 81 79 77 68  Resp: 14 (!) 9 12 12   Temp:      TempSrc:      SpO2: 99% 100% 99% 99%  Weight:      Height:        Intake/Output Summary (Last 24 hours) at 12/16/2018 1112 Last data filed at 12/16/2018 0342 Gross per 24 hour  Intake 4649.99 ml  Output -  Net 4649.99 ml   Filed Weights   12/15/18 1336  Weight: 77 kg    General appearance: alert, cooperative, appears stated age and no distress Head: Normocephalic, without obvious  abnormality, atraumatic Resp: Normal effort.  Clear to auscultation bilaterally.  No wheezing rales or rhonchi. Cardio: regular rate and rhythm, S1, S2 normal, no murmur, click, rub or gallop GI: Bilateral nephrostomy tubes noted.  Dark urine.  Tender in the abdomen bilaterally right and left upper quadrants.  No rebound injury to guarding.  Less compared to yesterday.  Bowel sounds present.  No masses organomegaly. Extremities: 2+ nonpitting lower extremity edema, right greater than left. Pulses: 2+ and symmetric Neurologic: Alert and oriented x3.  No focal neurological deficits appreciated.  Lab Results:  Data Reviewed: I have personally reviewed following labs and imaging studies  CBC: Recent Labs  Lab 12/14/18 1131 12/15/18 1323 12/15/18 1800 12/16/18 0346  WBC 1.6* 2.2*  --  3.1*  NEUTROABS 1.0*  --  1.6* 2.7  HGB 5.9* 8.0*  --  9.1*  HCT 18.5* 25.3*  --  28.9*  MCV 91.6 91.7  --  90.3  PLT 125* 93*  --  79*    Basic Metabolic Panel: Recent Labs  Lab 12/14/18 1131 12/15/18 1323 12/16/18 0346  NA 132* 124* 130*  K 4.1 3.0* 4.2  CL 97* 100 99  CO2 18* 15* 17*  GLUCOSE 143* 146* 121*  BUN 42* 47* 52*  CREATININE 2.90* 3.44* 3.93*  CALCIUM 8.7* 6.3* 7.6*  MG 2.0  --   --     GFR: Estimated Creatinine Clearance: 15.7 mL/min (A) (by C-G formula based on SCr  of 3.93 mg/dL (H)).  Liver Function Tests: Recent Labs  Lab 12/14/18 1131 12/15/18 1323 12/16/18 0346 12/16/18 0859  AST 114* 44* 24  --   ALT 46* 31 27  --   ALKPHOS 246* 139* 131*  --   BILITOT 4.0* 4.6* 4.1* 3.7*  PROT 7.2 5.8* 5.8*  --   ALBUMIN 2.1* 2.0* 1.9*  --     Recent Labs  Lab 12/15/18 1323  LIPASE 23   Coagulation Profile: Recent Labs  Lab 12/16/18 0346  INR 1.42     Recent Results (from the past 240 hour(s))  Urine culture     Status: Abnormal   Collection Time: 12/09/18  9:03 AM  Result Value Ref Range Status   Specimen Description KIDNEY RIGHT  Final   Special Requests    Final    NONE Performed at [facility], [place] [address] [place], [place] [address]    Culture (A)  Final    >=100,000 COLONIES/mL PROTEUS MIRABILIS 50,000 COLONIES/mL ENTEROCOCCUS FAECALIS 80,000 COLONIES/mL PSEUDOMONAS AERUGINOSA    Report Status 12/15/2018 FINAL  Final   Organism ID, Bacteria PROTEUS MIRABILIS (A)  Final   Organism ID, Bacteria ENTEROCOCCUS FAECALIS (A)  Final   Organism ID, Bacteria PSEUDOMONAS AERUGINOSA (A)  Final      Susceptibility   Enterococcus faecalis - MIC*    AMPICILLIN <=2 SENSITIVE Sensitive     VANCOMYCIN 4 SENSITIVE Sensitive     GENTAMICIN SYNERGY SENSITIVE Sensitive     * 50,000 COLONIES/mL ENTEROCOCCUS FAECALIS   Pseudomonas aeruginosa - MIC*    CEFTAZIDIME 32 RESISTANT Resistant     CIPROFLOXACIN <=0.25 SENSITIVE Sensitive     GENTAMICIN 4 SENSITIVE Sensitive     IMIPENEM 2 SENSITIVE Sensitive     PIP/TAZO <=4 SENSITIVE Sensitive     CEFEPIME INTERMEDIATE Intermediate     * 80,000 COLONIES/mL PSEUDOMONAS AERUGINOSA   Proteus mirabilis - MIC*    AMPICILLIN <=2 SENSITIVE Sensitive     CEFAZOLIN 8 SENSITIVE Sensitive     CEFEPIME <=1 SENSITIVE Sensitive     CEFTAZIDIME <=1 SENSITIVE Sensitive     CEFTRIAXONE <=1 SENSITIVE Sensitive     CIPROFLOXACIN <=0.25 SENSITIVE Sensitive     GENTAMICIN 8 INTERMEDIATE Intermediate     IMIPENEM 8 INTERMEDIATE Intermediate     TRIMETH/SULFA >=320 RESISTANT Resistant     AMPICILLIN/SULBACTAM <=2 SENSITIVE Sensitive     PIP/TAZO <=4 SENSITIVE Sensitive     * >=100,000 COLONIES/mL PROTEUS MIRABILIS  Blood Culture (routine x 2)     Status: None (Preliminary result)   Collection Time: 12/15/18  1:41 PM  Result Value Ref Range Status   Specimen Description BLOOD RIGHT ANTECUBITAL  Final   Special Requests   Final    BOTTLES DRAWN AEROBIC AND ANAEROBIC Blood Culture adequate volume Performed at [facility], [place] [address], [place], [place] [address]    Culture PENDING   Incomplete   Report Status PENDING  Incomplete      Radiology Studies: Ct Abdomen Pelvis Wo Contrast  Result Date: 12/15/2018 CLINICAL DATA:  Abdominal pain, fever. EXAM: CT ABDOMEN AND PELVIS WITHOUT CONTRAST TECHNIQUE: Multidetector CT imaging of the abdomen and pelvis was performed following the standard protocol without IV contrast. COMPARISON:  11/01/2018 FINDINGS: Lower chest: Nodule in the peripheral right middle lobe measures 7 mm on image 8. This is new since PET CT from 10/09/2018. No effusions. Heart is normal size. Hepatobiliary: No focal hepatic abnormality. Gallbladder unremarkable. Pancreas: No focal  abnormality or ductal dilatation. Spleen: No focal abnormality.  Normal size. Adrenals/Urinary Tract: Bilateral nephrostomy catheters in place. Right ureteral stent in place. Previously seen left perinephric fluid has decreased. No hydronephrosis currently. Adrenal glands and urinary bladder unremarkable. Stomach/Bowel: Stomach, large and small bowel grossly unremarkable. Vascular/Lymphatic: No evidence of aneurysm or adenopathy. Reproductive: Difficult to assess lower pelvic structures without intravenous and oral contrast. Other: No visible free fluid or free air. Musculoskeletal: No acute bony abnormality. IMPRESSION: Bilateral nephrostomy catheters in place with no hydronephrosis. Decreasing left perinephric fluid since prior study. Right ureteral stent in place. New 7 mm nodule in the peripheral right lung along the minor fissure. Cannot exclude metastasis. No definite acute process. Electronically Signed   By: Rolm Baptise M.D.   On: 12/15/2018 19:15   Dg Chest Port 1 View  Result Date: 12/15/2018 CLINICAL DATA:  Patient with diarrhea.  Back pain. EXAM: PORTABLE CHEST 1 VIEW COMPARISON:  Chest radiograph 01/25/2010 FINDINGS: Monitoring leads overlie the patient. Right anterior chest wall Port-A-Cath is present with tip projecting over the superior vena cava. Cardiomegaly. Bilateral  interstitial pulmonary opacities. Low lung volumes. No large area pulmonary consolidation. No pleural effusion or pneumothorax. IMPRESSION: Bilateral interstitial opacities may represent mild pulmonary edema. Electronically Signed   By: Lovey Newcomer M.D.   On: 12/15/2018 14:34     Medications:  Scheduled: . bictegravir-emtricitabine-tenofovir AF  1 tablet Oral Daily  . hydrocortisone sod succinate (SOLU-CORTEF) inj  100 mg Intravenous Q6H  . levothyroxine  25 mcg Oral Q0600  . morphine  60 mg Oral Q12H  . vancomycin variable dose per unstable renal function (pharmacist dosing)   Does not apply See admin instructions   Continuous: . sodium chloride 125 mL/hr at 12/16/18 0340  . piperacillin-tazobactam (ZOSYN)  IV 2.25 g (12/16/18 0418)   PIR:[id number] **OR** acetaminophen, morphine injection    Assessment/Plan:  Sepsis most likely secondary to urinary tract infection CT scan of the abdomen and pelvis did not show any acute findings.  No hydronephrosis.  The fluid collections noted previously had decreased in size.  Patient has not had any further episodes of diarrhea.  The only positive infectious finding is UA which was abnormal.  So this is most likely UTI.  Waiting on urine cultures.  She had urine cultures done on 2/2.  Report was reviewed.  She grew Proteus, enterococcus and Pseudomonas. Patient remains on vancomycin and Zosyn which will be continued for now.  Patient also receiving stress dose steroids.  Lactic acid level was normal.  Procalcitonin 63.05.  Respiratory status is stable.  Acute renal failure with normal anion gap metabolic acidosis CT scan did not show any hydronephrosis.  Acute renal failure most likely multifactorial including sepsis, hypovolemia, hypotension.  Blood pressure has improved.  She is being aggressively hydrated.  Creatinine has increased slightly compared to yesterday.  Continue aggressive hydration.  Monitor urine output.  Recheck labs tomorrow.   Bicarbonate level has improved.  If renal function continues to get worse that would portend a poor prognosis.  Patient is likely not a candidate for renal replacement treatments.  History of hydronephrosis and bilateral percutaneous nephrostomy CT scan showed these to be stable.  The fluid collection seen previously had decreased.  The nephrostomies were exchanged on 2/3.  Continue to monitor for now.  Hyponatremia and hypokalemia Potassium level is normal this morning.  Sodium level has improved.  Continue IV fluids.  Pancytopenia Absolute neutrophil count has improved.  She does not need the Granix  at this time.  It appears that she was not given the dose ordered last night. This is all likely due to chemotherapy that she received recently.  Platelet counts remains low.  No evidence for bleeding.  Continue to monitor for now.  Avoid heparin products for now.  Hyperbilirubinemia Reason for this is not entirely clear.  Bilirubin was fractionated and appears to be mostly direct.  CT scan did not show any abnormalities in the hepatobiliary system.  Could be related to medications.  Stage IV cervical cancer Followed by medical oncology.  Receiving chemotherapy.  Last dose on January 30.  Medical oncology notified via epic.  Palliative medicine has also been consulted.  Cancer associated pain Continue with her long-acting morphine.  Seems to be comfortable at this time without any significant pain issues.  History of HIV Continue with antiretroviral treatment  Lower extremity edema Her right leg is larger than the left.  Apparently this has been present for a week or 2.  Lower extremity Doppler studies pending.  Acute diarrhea Has not had any further episodes in the hospital.  This is most likely due to chemotherapy.  Very low suspicion for C. Difficile.  We will cancel the stool study.  DVT Prophylaxis: Waiting on venous Doppler study    Code Status: Patient has elected to be full code  during this hospitalization.  Wait on palliative medicine to talk to the patient again. Family Communication: Discussed with the patient.  No family at bedside Disposition Plan: Management as outlined above.  She continues to require stepdown status for now.    LOS: 1 day   Vasilisa Vore [employer] on [email or web address]  12/16/2018, 11:12 AM

## 2018-12-16 NOTE — Progress Notes (Signed)
PHARMACY - PHYSICIAN COMMUNICATION CRITICAL VALUE ALERT - BLOOD CULTURE IDENTIFICATION (BCID)  Misty Thomas is an 53 y.o. female who presented to [facility] on 12/15/2018 with a chief complaint of diarrhea, weakness and fever.  Assessment:  Pseudomonas in 1 of 4 blood cultures, source possible urinary s/p bilateral percutaneous nephrostomy (include suspected source if known)  Name of physician (or Provider) Contacted: Dr. Maryland Pink  Current antibiotics: Vancomycin and Zosyn  Changes to prescribed antibiotics recommended:  Continue current antibiotics - previous pseudomonas culture 12/09/18 intermediate sensitive to cefepime, sensitive to zosyn so continue this; also continue vancomycin until urine culture finalized since previous culture grew enterococcus.  Will increase Zosyn to 2.25g IV q6h given bacteremia.  Results for orders placed or performed during the hospital encounter of 12/15/18  Blood Culture ID Panel (Reflexed) (Collected: 12/15/2018  1:41 PM)  Result Value Ref Range   Enterococcus species NOT DETECTED NOT DETECTED   Listeria monocytogenes NOT DETECTED NOT DETECTED   Staphylococcus species NOT DETECTED NOT DETECTED   Staphylococcus aureus (BCID) NOT DETECTED NOT DETECTED   Streptococcus species NOT DETECTED NOT DETECTED   Streptococcus agalactiae NOT DETECTED NOT DETECTED   Streptococcus pneumoniae NOT DETECTED NOT DETECTED   Streptococcus pyogenes NOT DETECTED NOT DETECTED   Acinetobacter baumannii NOT DETECTED NOT DETECTED   Enterobacteriaceae species NOT DETECTED NOT DETECTED   Enterobacter cloacae complex NOT DETECTED NOT DETECTED   Escherichia coli NOT DETECTED NOT DETECTED   Klebsiella oxytoca NOT DETECTED NOT DETECTED   Klebsiella pneumoniae NOT DETECTED NOT DETECTED   Proteus species NOT DETECTED NOT DETECTED   Serratia marcescens NOT DETECTED NOT DETECTED   Carbapenem resistance NOT DETECTED NOT DETECTED   Haemophilus influenzae NOT DETECTED NOT DETECTED   Neisseria meningitidis NOT DETECTED NOT DETECTED   Pseudomonas aeruginosa DETECTED (A) NOT DETECTED   Candida albicans NOT DETECTED NOT DETECTED   Candida glabrata NOT DETECTED NOT DETECTED   Candida krusei NOT DETECTED NOT DETECTED   Candida parapsilosis NOT DETECTED NOT DETECTED   Candida tropicalis NOT DETECTED NOT DETECTED    Peggyann Juba, PharmD, BCPS Pager: [phone] 12/16/2018  6:31 PM

## 2018-12-16 NOTE — ED Notes (Signed)
ED TO INPATIENT HANDOFF REPORT  Name/Age/Gender Misty Thomas 53 y.o. female  Code Status    Code Status Orders  (From admission, onward)         Start     Ordered   12/16/18 0736  Full code  Continuous     12/16/18 0735        Code Status History    Date Active Date Inactive Code Status Order ID Comments User Context   11/07/2018 1458 11/09/2018 2045 DNR [id number]  Knox Royalty, NP Inpatient   10/26/2018 2209 11/07/2018 1457 Full Code [id number]  Norval Morton, MD ED      Home/SNF/Other Home  Chief Complaint diarrhea  Level of Care/Admitting Diagnosis ED Disposition    ED Disposition Condition [facility] [[id number]]  Level of Care: Stepdown [14]  Admit to SDU based on following criteria: Hemodynamic compromise or significant risk of instability:  Patient requiring short term acute titration and management of vasoactive drips, and invasive monitoring (i.e., CVP and Arterial line).  Diagnosis: Sepsis [facility]) [[id number]]  Admitting Physician: Bonnielee Haff [[id number]]  Attending Physician: Bonnielee Haff [[id number]]  Estimated length of stay: past midnight tomorrow  Certification:: I certify this patient will need inpatient services for at least 2 midnights  PT Class (Do Not Modify): Inpatient [101]  PT Acc Code (Do Not Modify): Private [1]       Medical History Past Medical History:  Diagnosis Date  . Acquired pancytopenia ([place]) 03/2018  . Anemia    Mild  . Cancer associated pain   . 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 Chemotherapy induced nausea and vomiting   . Diabetes mellitus type 2, diet-controlled ([place])    pt. denies No meds  . Frequency of urination   . History of  cellulitis 2018   bilateral lower leg w/ mrsa  . History of external beam radiation therapy 02-05-2018  to 03-16-2018   cervical cancer  . History of MRSA infection 2018   w/ bilateral lower leg cellulitis  . HIV (human immunodeficiency virus infection) ([place])    asymptomatic  . Hypomagnesemia 03/2018   Severe---- takes oral magnesium and IV magnesium as needed (cancer center)  . Intermittent diarrhea    due to radiation/ chemo  . Nocturia   . Port-A-Cath in place    Power port  . Radiation burn    LOWER ABD.  04-06-2018  per is healing  . Subclinical hypothyroidism    w/ thyroiditis , dx 01-22-2018 PET scan  . Thrombocytopenia ([place])   . Wears glasses     Allergies No Known Allergies  IV Location/Drains/Wounds Patient Lines/Drains/Airways Status   Active Line/Drains/Airways    Name:   Placement date:   Placement time:   Site:   Days:   Implanted Port 10/27/18 Chest   10/27/18    -    Chest   50   Peripheral IV 12/15/18 Right;Upper Arm   12/15/18    1906    Arm   1   Closed System Drain 1 Right Back Other (Comment) 10 Fr.   11/04/18    1242    Back   42   Closed System Drain 2 Left Back Other (Comment) 10 Fr.  11/04/18    1243    Back   42   Nephrostomy Right 10.2 Fr.   12/10/18    1134    Right   6   Nephrostomy Left 10.2 Fr.   12/10/18    1134    Left   6   Ureteral Drain/Stent Right ureter 6 Fr.   10/24/18    1038    Right ureter   53          Labs/Imaging Results for orders placed or performed during the hospital encounter of 12/15/18 (from the past 48 hour(s))  Lipase, blood     Status: None   Collection Time: 12/15/18  1:23 PM  Result Value Ref Range   Lipase 23 11 - 51 U/L    Comment: Performed at [facility], [place] [address] [place], [place] [address]  Comprehensive metabolic panel     Status: Abnormal   Collection Time: 12/15/18  1:23 PM  Result Value Ref Range   Sodium 124 (L) 135 - 145 mmol/L   Potassium 3.0 (L) 3.5 - 5.1 mmol/L    Chloride 100 98 - 111 mmol/L   CO2 15 (L) 22 - 32 mmol/L   Glucose, Bld 146 (H) 70 - 99 mg/dL   BUN 47 (H) 6 - 20 mg/dL   Creatinine, Ser 3.44 (H) 0.44 - 1.00 mg/dL   Calcium 6.3 (LL) 8.9 - 10.3 mg/dL    Comment: CRITICAL RESULT CALLED TO, READ BACK BY AND VERIFIED WITH: M.TAI,RN 12/15/18 '@1425'  BY V.WILKINS    Total Protein 5.8 (L) 6.5 - 8.1 g/dL   Albumin 2.0 (L) 3.5 - 5.0 g/dL   AST 44 (H) 15 - 41 U/L   ALT 31 0 - 44 U/L   Alkaline Phosphatase 139 (H) 38 - 126 U/L   Total Bilirubin 4.6 (H) 0.3 - 1.2 mg/dL   GFR calc non Af Amer 15 (L) >60 mL/min   GFR calc Af Amer 17 (L) >60 mL/min   Anion gap 9 5 - 15    Comment: Performed at [facility], [place] [address], [place], [place] [address]  CBC     Status: Abnormal   Collection Time: 12/15/18  1:23 PM  Result Value Ref Range   WBC 2.2 (L) 4.0 - 10.5 K/uL   RBC 2.76 (L) 3.87 - 5.11 MIL/uL   Hemoglobin 8.0 (L) 12.0 - 15.0 g/dL   HCT 25.3 (L) 36.0 - 46.0 %   MCV 91.7 80.0 - 100.0 fL   MCH 29.0 26.0 - 34.0 pg   MCHC 31.6 30.0 - 36.0 g/dL   RDW 16.0 (H) 11.5 - 15.5 %   Platelets 93 (L) 150 - 400 K/uL    Comment: REPEATED TO VERIFY PLATELET COUNT CONFIRMED BY SMEAR SPECIMEN CHECKED FOR CLOTS Immature Platelet Fraction may be clinically indicated, consider ordering this additional test [id number]    nRBC 0.0 0.0 - 0.2 %    Comment: Performed at [facility], [place] [address], [place], [place] [address]  I-Stat beta hCG blood, ED     Status: Abnormal   Collection Time: 12/15/18  1:27 PM  Result Value Ref Range   I-stat hCG, quantitative 10.1 (H) <5 mIU/mL   Comment 3            Comment:   GEST. AGE      CONC.  (mIU/mL)   <=1 WEEK        5 - 50  2 WEEKS       50 - 500     3 WEEKS       100 - 10,000     4 WEEKS     1,000 - 30,000        FEMALE AND NON-PREGNANT FEMALE:     LESS THAN 5 mIU/mL   Lactic acid, plasma     Status: None   Collection Time: 12/15/18  1:41 PM  Result Value Ref Range    Lactic Acid, Venous 1.4 0.5 - 1.9 mmol/L    Comment: Performed at [facility], [place] [address] [place], [place] [address]  Blood Culture (routine x 2)     Status: None (Preliminary result)   Collection Time: 12/15/18  1:41 PM  Result Value Ref Range   Specimen Description BLOOD RIGHT ANTECUBITAL    Special Requests      BOTTLES DRAWN AEROBIC AND ANAEROBIC Blood Culture adequate volume Performed at [place] [address], [place], [place] [address]    Culture PENDING    Report Status PENDING   Urinalysis, Routine w reflex microscopic     Status: Abnormal   Collection Time: 12/15/18  3:15 PM  Result Value Ref Range   Color, Urine BROWN (A) YELLOW    Comment: BIOCHEMICALS MAY BE AFFECTED BY COLOR   APPearance TURBID (A) CLEAR   Specific Gravity, Urine 1.020 1.005 - 1.030   pH 6.5 5.0 - 8.0   Glucose, UA NEGATIVE NEGATIVE mg/dL   Hgb urine dipstick LARGE (A) NEGATIVE   Bilirubin Urine LARGE (A) NEGATIVE   Ketones, ur TRACE (A) NEGATIVE mg/dL   Protein, ur >300 (A) NEGATIVE mg/dL   Nitrite POSITIVE (A) NEGATIVE   Leukocytes, UA MODERATE (A) NEGATIVE    Comment: Performed at [facility], [place] [address], [place], [place] [address]  Urinalysis, Microscopic (reflex)     Status: Abnormal   Collection Time: 12/15/18  3:15 PM  Result Value Ref Range   RBC / HPF 6-10 0 - 5 RBC/hpf   WBC, UA 11-20 0 - 5 WBC/hpf   Bacteria, UA MANY (A) NONE SEEN   Squamous Epithelial / LPF NONE SEEN 0 - 5   Budding Yeast PRESENT    Amorphous Crystal PRESENT     Comment: Performed at [facility], [place] [address], [place], [place] [address]  Lactic acid, plasma     Status: None   Collection Time: 12/15/18  3:34 PM  Result Value Ref Range   Lactic Acid, Venous 1.1 0.5 - 1.9 mmol/L    Comment: Performed at [facility], [place] [address] [place], [place] [address]  Differential     Status: Abnormal    Collection Time: 12/15/18  6:00 PM  Result Value Ref Range   Neutrophils Relative % 69 %   Neutro Abs 1.6 (L) 1.7 - 7.7 K/uL   Lymphocytes Relative 12 %   Lymphs Abs 0.3 (L) 0.7 - 4.0 K/uL   Monocytes Relative 12 %   Monocytes Absolute 0.3 0.1 - 1.0 K/uL   Eosinophils Relative 0 %   Eosinophils Absolute 0.0 0.0 - 0.5 K/uL   Basophils Relative 0 %   Basophils Absolute 0.0 0.0 - 0.1 K/uL   WBC Morphology INCREASED BANDS (>20% BANDS)     Comment: MILD LEFT SHIFT (1-5% METAS, OCC MYELO, OCC BANDS) TOXIC GRANULATION    Burr Cells PRESENT    Polychromasia PRESENT     Comment:  Performed at [facility], [place] [address] [place], [place] [address]  CBC WITH DIFFERENTIAL     Status: Abnormal   Collection Time: 12/16/18  3:46 AM  Result Value Ref Range   WBC 3.1 (L) 4.0 - 10.5 K/uL   RBC 3.20 (L) 3.87 - 5.11 MIL/uL   Hemoglobin 9.1 (L) 12.0 - 15.0 g/dL   HCT 28.9 (L) 36.0 - 46.0 %   MCV 90.3 80.0 - 100.0 fL   MCH 28.4 26.0 - 34.0 pg   MCHC 31.5 30.0 - 36.0 g/dL   RDW 16.1 (H) 11.5 - 15.5 %   Platelets 79 (L) 150 - 400 K/uL    Comment: REPEATED TO VERIFY Immature Platelet Fraction may be clinically indicated, consider ordering this additional test [id number] CONSISTENT WITH PREVIOUS RESULT    nRBC 0.0 0.0 - 0.2 %   Neutrophils Relative % 86 %   Neutro Abs 2.7 1.7 - 7.7 K/uL   Lymphocytes Relative 7 %   Lymphs Abs 0.2 (L) 0.7 - 4.0 K/uL   Monocytes Relative 5 %   Monocytes Absolute 0.2 0.1 - 1.0 K/uL   Eosinophils Relative 0 %   Eosinophils Absolute 0.0 0.0 - 0.5 K/uL   Basophils Relative 1 %   Basophils Absolute 0.0 0.0 - 0.1 K/uL   WBC Morphology MILD LEFT SHIFT (1-5% METAS, OCC MYELO, OCC BANDS)     Comment: TOXIC GRANULATION   Immature Granulocytes 1 %   Abs Immature Granulocytes 0.03 0.00 - 0.07 K/uL    Comment: Performed at [facility], [place] [address], [place], [place] [address]  Comprehensive metabolic panel     Status: Abnormal    Collection Time: 12/16/18  3:46 AM  Result Value Ref Range   Sodium 130 (L) 135 - 145 mmol/L   Potassium 4.2 3.5 - 5.1 mmol/L    Comment: DELTA CHECK NOTED SLIGHT HEMOLYSIS    Chloride 99 98 - 111 mmol/L   CO2 17 (L) 22 - 32 mmol/L   Glucose, Bld 121 (H) 70 - 99 mg/dL   BUN 52 (H) 6 - 20 mg/dL   Creatinine, Ser 3.93 (H) 0.44 - 1.00 mg/dL   Calcium 7.6 (L) 8.9 - 10.3 mg/dL   Total Protein 5.8 (L) 6.5 - 8.1 g/dL   Albumin 1.9 (L) 3.5 - 5.0 g/dL   AST 24 15 - 41 U/L   ALT 27 0 - 44 U/L   Alkaline Phosphatase 131 (H) 38 - 126 U/L   Total Bilirubin 4.1 (H) 0.3 - 1.2 mg/dL   GFR calc non Af Amer 12 (L) >60 mL/min   GFR calc Af Amer 14 (L) >60 mL/min   Anion gap 14 5 - 15    Comment: Performed at [facility], [place] [address] [place], [place] [address]  Protime-INR     Status: Abnormal   Collection Time: 12/16/18  3:46 AM  Result Value Ref Range   Prothrombin Time 17.2 (H) 11.4 - 15.2 seconds   INR 1.42     Comment: Performed at [facility], [place] [address], [place], [place] [address]  Lactic acid, plasma     Status: None   Collection Time: 12/16/18  8:59 AM  Result Value Ref Range   Lactic Acid, Venous 0.8 0.5 - 1.9 mmol/L    Comment: Performed at [facility], [place] [address] [place], [place] [address]  Procalcitonin     Status: None   Collection Time: 12/16/18  8:59 AM  Result Value Ref Range   Procalcitonin 63.05 ng/mL    Comment:        Interpretation: PCT >= 10 ng/mL: Important systemic inflammatory response, almost exclusively due to severe bacterial sepsis or septic shock. (NOTE)       Sepsis PCT Algorithm           Lower Respiratory Tract                                      Infection PCT Algorithm    ----------------------------     ----------------------------         PCT < 0.25 ng/mL                PCT < 0.10 ng/mL         Strongly encourage             Strongly discourage   discontinuation of antibiotics     initiation of antibiotics    ----------------------------     -----------------------------       PCT 0.25 - 0.50 ng/mL            PCT 0.10 - 0.25 ng/mL               OR       >80% decrease in PCT            Discourage initiation of                                            antibiotics      Encourage discontinuation           of antibiotics    ----------------------------     -----------------------------         PCT >= 0.50 ng/mL              PCT 0.26 - 0.50 ng/mL                AND       <80% decrease in PCT             Encourage initiation of                                             antibiotics       Encourage continuation           of antibiotics    ----------------------------     -----------------------------        PCT >= 0.50 ng/mL                  PCT > 0.50 ng/mL               AND         increase in PCT                  Strongly encourage                                      initiation of antibiotics    Strongly encourage escalation  of antibiotics                                     -----------------------------                                           PCT <= 0.25 ng/mL                                                 OR                                        > 80% decrease in PCT                                     Discontinue / Do not initiate                                             antibiotics Performed at [place] [address], [place], [place] [address]   Bilirubin, fractionated(tot/dir/indir)     Status: Abnormal   Collection Time: 12/16/18  8:59 AM  Result Value Ref Range   Total Bilirubin 3.7 (H) 0.3 - 1.2 mg/dL   Bilirubin, Direct 2.3 (H) 0.0 - 0.2 mg/dL   Indirect Bilirubin 1.4 (H) 0.3 - 0.9 mg/dL    Comment: Performed at [facility], [place] [address] [place], [place] [address]   Ct Abdomen Pelvis Wo Contrast  Result Date: 12/15/2018 CLINICAL DATA:  Abdominal pain, fever. EXAM: CT ABDOMEN  AND PELVIS WITHOUT CONTRAST TECHNIQUE: Multidetector CT imaging of the abdomen and pelvis was performed following the standard protocol without IV contrast. COMPARISON:  11/01/2018 FINDINGS: Lower chest: Nodule in the peripheral right middle lobe measures 7 mm on image 8. This is new since PET CT from 10/09/2018. No effusions. Heart is normal size. Hepatobiliary: No focal hepatic abnormality. Gallbladder unremarkable. Pancreas: No focal abnormality or ductal dilatation. Spleen: No focal abnormality.  Normal size. Adrenals/Urinary Tract: Bilateral nephrostomy catheters in place. Right ureteral stent in place. Previously seen left perinephric fluid has decreased. No hydronephrosis currently. Adrenal glands and urinary bladder unremarkable. Stomach/Bowel: Stomach, large and small bowel grossly unremarkable. Vascular/Lymphatic: No evidence of aneurysm or adenopathy. Reproductive: Difficult to assess lower pelvic structures without intravenous and oral contrast. Other: No visible free fluid or free air. Musculoskeletal: No acute bony abnormality. IMPRESSION: Bilateral nephrostomy catheters in place with no hydronephrosis. Decreasing left perinephric fluid since prior study. Right ureteral stent in place. New 7 mm nodule in the peripheral right lung along the minor fissure. Cannot exclude metastasis. No definite acute process. Electronically Signed   By: Rolm Baptise M.D.   On: 12/15/2018 19:15   Dg Chest Port 1 View  Result Date: 12/15/2018 CLINICAL DATA:  Patient with diarrhea.  Back pain. EXAM: PORTABLE CHEST 1 VIEW COMPARISON:  Chest radiograph 01/25/2010 FINDINGS: Monitoring leads  overlie the patient. Right anterior chest wall Port-A-Cath is present with tip projecting over the superior vena cava. Cardiomegaly. Bilateral interstitial pulmonary opacities. Low lung volumes. No large area pulmonary consolidation. No pleural effusion or pneumothorax. IMPRESSION: Bilateral interstitial opacities may represent mild  pulmonary edema. Electronically Signed   By: Lovey Newcomer M.D.   On: 12/15/2018 14:34   EKG Interpretation  Date/Time:  Saturday December 15 2018 13:47:52 EST Ventricular Rate:  119 PR Interval:    QRS Duration: 87 QT Interval:  310 QTC Calculation: 437 R Axis:   37 Text Interpretation:  Sinus tachycardia Abnormal R-wave progression, early transition Confirmed by Randal Buba, April ([id number]) on 12/16/2018 9:16:21 AM   Pending Labs Unresulted Labs (From admission, onward)    Start     Ordered   12/16/18 0735  Lactic acid, plasma  STAT Now then every 3 hours,   STAT     12/16/18 0735   12/16/18 0500  CBC WITH DIFFERENTIAL  Daily,   R     12/16/18 0140   12/15/18 1324  C difficile quick scan w PCR reflex  (C Difficile quick screen w PCR reflex panel)  Once, for 24 hours,   R     12/15/18 1323   12/15/18 1323  Blood Culture (routine x 2)  BLOOD CULTURE X 2,   STAT     12/15/18 1323   12/15/18 1323  Urine culture  ONCE - STAT,   STAT     12/15/18 1323          Vitals/Pain Today's Vitals   12/16/18 0900 12/16/18 0903 12/16/18 0950 12/16/18 1100  BP: 93/75  (!) 89/62 92/67  Pulse: 79  77 68  Resp: (!) '9  12 12  ' Temp:      TempSrc:      SpO2: 100%  99% 99%  Weight:      Height:      PainSc:  0-No pain      Isolation Precautions Enteric precautions (UV disinfection)  Medications Medications  hydrocortisone sodium succinate (SOLU-CORTEF) 100 MG injection 100 mg (100 mg Intravenous Given 12/16/18 0855)  morphine (MS CONTIN) 12 hr tablet 60 mg (has no administration in time range)  bictegravir-emtricitabine-tenofovir AF (BIKTARVY) 50-200-25 MG per tablet 1 tablet (has no administration in time range)  levothyroxine (SYNTHROID, LEVOTHROID) tablet 25 mcg (25 mcg Oral Given 12/16/18 0855)  acetaminophen (TYLENOL) tablet 650 mg (650 mg Oral Given 12/16/18 0403)    Or  acetaminophen (TYLENOL) suppository 650 mg ( Rectal See Alternative 12/16/18 0403)  0.9 %  sodium chloride infusion (  Intravenous New Bag/Given 12/16/18 0340)  piperacillin-tazobactam (ZOSYN) IVPB 2.25 g (2.25 g Intravenous New Bag/Given 12/16/18 0418)  morphine 2 MG/ML injection 2 mg (2 mg Intravenous Given 12/15/18 1826)  vancomycin variable dose per unstable renal function (pharmacist dosing) (has no administration in time range)  ceFEPIme (MAXIPIME) 2 g in sodium chloride 0.9 % 100 mL IVPB (0 g Intravenous Stopped 12/15/18 1506)  lactated ringers bolus 1,000 mL (0 mLs Intravenous Stopped 12/15/18 1501)  lactated ringers bolus 1,000 mL (0 mLs Intravenous Stopped 12/15/18 1723)  hydrocortisone sodium succinate (SOLU-CORTEF) 100 MG injection 100 mg (100 mg Intravenous Given 12/15/18 1736)  sodium bicarbonate injection 100 mEq (100 mEq Intravenous Given 12/16/18 0910)  vancomycin (VANCOCIN) 1,500 mg in sodium chloride 0.9 % 500 mL IVPB (0 mg Intravenous Stopped 12/15/18 2346)    Mobility walks

## 2018-12-17 ENCOUNTER — Inpatient Hospital Stay (HOSPITAL_COMMUNITY): Payer: Medicaid Other

## 2018-12-17 DIAGNOSIS — Z79899 Other long term (current) drug therapy: Secondary | ICD-10-CM

## 2018-12-17 DIAGNOSIS — G893 Neoplasm related pain (acute) (chronic): Secondary | ICD-10-CM

## 2018-12-17 DIAGNOSIS — R7881 Bacteremia: Secondary | ICD-10-CM | POA: Diagnosis present

## 2018-12-17 DIAGNOSIS — C799 Secondary malignant neoplasm of unspecified site: Secondary | ICD-10-CM

## 2018-12-17 DIAGNOSIS — L8990 Pressure ulcer of unspecified site, unspecified stage: Secondary | ICD-10-CM

## 2018-12-17 DIAGNOSIS — B965 Pseudomonas (aeruginosa) (mallei) (pseudomallei) as the cause of diseases classified elsewhere: Secondary | ICD-10-CM

## 2018-12-17 DIAGNOSIS — N3090 Cystitis, unspecified without hematuria: Secondary | ICD-10-CM

## 2018-12-17 DIAGNOSIS — C539 Malignant neoplasm of cervix uteri, unspecified: Secondary | ICD-10-CM

## 2018-12-17 DIAGNOSIS — Z936 Other artificial openings of urinary tract status: Secondary | ICD-10-CM

## 2018-12-17 DIAGNOSIS — A498 Other bacterial infections of unspecified site: Secondary | ICD-10-CM | POA: Diagnosis present

## 2018-12-17 DIAGNOSIS — M7989 Other specified soft tissue disorders: Secondary | ICD-10-CM

## 2018-12-17 DIAGNOSIS — N135 Crossing vessel and stricture of ureter without hydronephrosis: Secondary | ICD-10-CM

## 2018-12-17 DIAGNOSIS — I82401 Acute embolism and thrombosis of unspecified deep veins of right lower extremity: Secondary | ICD-10-CM

## 2018-12-17 DIAGNOSIS — Z21 Asymptomatic human immunodeficiency virus [HIV] infection status: Secondary | ICD-10-CM

## 2018-12-17 LAB — CBC WITH DIFFERENTIAL/PLATELET
Abs Immature Granulocytes: 0.19 10*3/uL — ABNORMAL HIGH (ref 0.00–0.07)
Basophils Absolute: 0 10*3/uL (ref 0.0–0.1)
Basophils Relative: 0 %
Eosinophils Absolute: 0 10*3/uL (ref 0.0–0.5)
Eosinophils Relative: 0 %
HCT: 27 % — ABNORMAL LOW (ref 36.0–46.0)
Hemoglobin: 8.6 g/dL — ABNORMAL LOW (ref 12.0–15.0)
Immature Granulocytes: 3 %
Lymphocytes Relative: 5 %
Lymphs Abs: 0.4 10*3/uL — ABNORMAL LOW (ref 0.7–4.0)
MCH: 28.4 pg (ref 26.0–34.0)
MCHC: 31.9 g/dL (ref 30.0–36.0)
MCV: 89.1 fL (ref 80.0–100.0)
Monocytes Absolute: 0.3 10*3/uL (ref 0.1–1.0)
Monocytes Relative: 4 %
NEUTROS ABS: 6.7 10*3/uL (ref 1.7–7.7)
Neutrophils Relative %: 88 %
Platelets: 78 10*3/uL — ABNORMAL LOW (ref 150–400)
RBC: 3.03 MIL/uL — ABNORMAL LOW (ref 3.87–5.11)
RDW: 16.1 % — ABNORMAL HIGH (ref 11.5–15.5)
WBC: 7.5 10*3/uL (ref 4.0–10.5)
nRBC: 0 % (ref 0.0–0.2)

## 2018-12-17 LAB — COMPREHENSIVE METABOLIC PANEL
ALT: 20 U/L (ref 0–44)
AST: 14 U/L — AB (ref 15–41)
Albumin: 1.7 g/dL — ABNORMAL LOW (ref 3.5–5.0)
Alkaline Phosphatase: 106 U/L (ref 38–126)
Anion gap: 13 (ref 5–15)
BUN: 61 mg/dL — ABNORMAL HIGH (ref 6–20)
CO2: 20 mmol/L — ABNORMAL LOW (ref 22–32)
Calcium: 7.3 mg/dL — ABNORMAL LOW (ref 8.9–10.3)
Chloride: 102 mmol/L (ref 98–111)
Creatinine, Ser: 3.75 mg/dL — ABNORMAL HIGH (ref 0.44–1.00)
GFR calc Af Amer: 15 mL/min — ABNORMAL LOW (ref >60)
GFR, EST NON AFRICAN AMERICAN: 13 mL/min — AB (ref >60)
Glucose, Bld: 200 mg/dL — ABNORMAL HIGH (ref 70–99)
Potassium: 3.9 mmol/L (ref 3.5–5.1)
Sodium: 135 mmol/L (ref 135–145)
Total Bilirubin: 2.3 mg/dL — ABNORMAL HIGH (ref 0.3–1.2)
Total Protein: 5.4 g/dL — ABNORMAL LOW (ref 6.5–8.1)

## 2018-12-17 LAB — VANCOMYCIN, RANDOM: Vancomycin Rm: 15

## 2018-12-17 LAB — URINE CULTURE

## 2018-12-17 LAB — PROCALCITONIN: Procalcitonin: 53.15 ng/mL

## 2018-12-17 LAB — HEMOGLOBIN AND HEMATOCRIT, BLOOD
HCT: 27.5 % — ABNORMAL LOW (ref 36.0–46.0)
Hemoglobin: 8.8 g/dL — ABNORMAL LOW (ref 12.0–15.0)

## 2018-12-17 MED ORDER — HEPARIN BOLUS VIA INFUSION
1800.0000 [IU] | Freq: Once | INTRAVENOUS | Status: AC
  Administered 2018-12-17: 1800 [IU] via INTRAVENOUS
  Filled 2018-12-17: qty 1800

## 2018-12-17 MED ORDER — HEPARIN (PORCINE) 25000 UT/250ML-% IV SOLN
1150.0000 [IU]/h | INTRAVENOUS | Status: DC
  Administered 2018-12-17: 1150 [IU]/h via INTRAVENOUS
  Filled 2018-12-17: qty 250

## 2018-12-17 MED ORDER — VANCOMYCIN HCL IN DEXTROSE 750-5 MG/150ML-% IV SOLN
750.0000 mg | Freq: Once | INTRAVENOUS | Status: AC
  Administered 2018-12-17: 750 mg via INTRAVENOUS
  Filled 2018-12-17: qty 150

## 2018-12-17 MED ORDER — SODIUM CHLORIDE 0.9 % IV BOLUS
500.0000 mL | Freq: Once | INTRAVENOUS | Status: AC
  Administered 2018-12-17: 500 mL via INTRAVENOUS

## 2018-12-17 MED ORDER — MORPHINE SULFATE ER 30 MG PO TBCR
30.0000 mg | EXTENDED_RELEASE_TABLET | Freq: Two times a day (BID) | ORAL | Status: DC
  Administered 2018-12-17 – 2018-12-19 (×5): 30 mg via ORAL
  Filled 2018-12-17 (×5): qty 1

## 2018-12-17 NOTE — Progress Notes (Addendum)
[facility] PROGRESS NOTE  Misty Thomas YQM:[id number] DOB: May 14, 1966 DOA: 12/15/2018  PCP: Patient, No Pcp Per  Brief History/Interval Summary: 52 y.o. female with a past medical history of stage IV cervical cancer receiving chemotherapy, last chemotherapy was on January 30, h/o HIV on antiretroviral treatment, hydronephrosis, status post ureteral stent, status post bilateral percutaneous nephrostomy who presented with  complaints of diarrhea that started on the morning of admission.  She had 2 episodes of watery stool.  She felt weak and lightheaded.  In the emergency department patient was noted to be hypotensive with a blood pressure initially in the 60s.  Evaluation revealed an abnormal UA.  She was noted to be pancytopenic.  There was concern for infection in the urinary tract.  She was also found to have acute renal failure.  She will need hospitalization for further management.  Reason for Visit: Sepsis secondary to unknown organism  Consultants: Medical oncology.  Procedures: None  Antibiotics: Vancomycin and Zosyn  Subjective/Interval History: Patient noted to be slightly lethargic this morning though easily arousable.  She states that overall she is feeling better.  Feels fatigued though.  Denies any significant pain issues.  No nausea vomiting.  Per nursing staff patient did have low blood pressures overnight requiring fluid bolus.    ROS: Denies any shortness of breath.  No chest pain.  Objective:  Vital Signs  Vitals:   12/17/18 0500 12/17/18 0600 12/17/18 0640 12/17/18 0641  BP: (!) 80/48 (!) 90/53    Pulse: 71 76 72 79  Resp: 10 12 (!) 21 14  Temp:      TempSrc:      SpO2: 95% 98% 98% 98%  Weight:      Height:        Intake/Output Summary (Last 24 hours) at 12/17/2018 0842 Last data filed at 12/17/2018 0600 Gross per 24 hour  Intake 2990.9 ml  Output 550 ml  Net 2440.9 ml   Filed Weights   12/15/18 1336  Weight: 77 kg   General appearance:  Fatigued.  Easily arousable.  In no distress Resp: Clear to auscultation bilaterally.  Normal effort Cardio: S1-S2 is normal regular.  No S3-S4.  No rubs murmurs or bruit GI: Abdomen is soft.  Nontender nondistended.  Bowel sounds are present normal.  No masses organomegaly.  Patient has bilateral nephrostomy tubes. Extremities: Edema noted both lower extremities with the right being significantly larger compared to the left. Neurologic: Alert and oriented x3.  No focal neurological deficits.    Lab Results:  Data Reviewed: I have personally reviewed following labs and imaging studies  CBC: Recent Labs  Lab 12/14/18 1131 12/15/18 1323 12/15/18 1800 12/16/18 0346 12/17/18 0340  WBC 1.6* 2.2*  --  3.1* 7.5  NEUTROABS 1.0*  --  1.6* 2.7 6.7  HGB 5.9* 8.0*  --  9.1* 8.6*  HCT 18.5* 25.3*  --  28.9* 27.0*  MCV 91.6 91.7  --  90.3 89.1  PLT 125* 93*  --  79* 78*    Basic Metabolic Panel: Recent Labs  Lab 12/14/18 1131 12/15/18 1323 12/16/18 0346 12/17/18 0340  NA 132* 124* 130* 135  K 4.1 3.0* 4.2 3.9  CL 97* 100 99 102  CO2 18* 15* 17* 20*  GLUCOSE 143* 146* 121* 200*  BUN 42* 47* 52* 61*  CREATININE 2.90* 3.44* 3.93* 3.75*  CALCIUM 8.7* 6.3* 7.6* 7.3*  MG 2.0  --   --   --     GFR: Estimated  Creatinine Clearance: 16.5 mL/min (A) (by C-G formula based on SCr of 3.75 mg/dL (H)).  Liver Function Tests: Recent Labs  Lab 12/14/18 1131 12/15/18 1323 12/16/18 0346 12/16/18 0859 12/17/18 0340  AST 114* 44* 24  --  14*  ALT 46* 31 27  --  20  ALKPHOS 246* 139* 131*  --  106  BILITOT 4.0* 4.6* 4.1* 3.7* 2.3*  PROT 7.2 5.8* 5.8*  --  5.4*  ALBUMIN 2.1* 2.0* 1.9*  --  1.7*    Recent Labs  Lab 12/15/18 1323  LIPASE 23   Coagulation Profile: Recent Labs  Lab 12/16/18 0346  INR 1.42     Recent Results (from the past 240 hour(s))  Urine culture     Status: Abnormal   Collection Time: 12/09/18  9:03 AM  Result Value Ref Range Status   Specimen Description  KIDNEY RIGHT  Final   Special Requests   Final    NONE Performed at [facility], [place] [address], [place], [place] [address]    Culture (A)  Final    >=100,000 COLONIES/mL PROTEUS MIRABILIS 50,000 COLONIES/mL ENTEROCOCCUS FAECALIS 80,000 COLONIES/mL PSEUDOMONAS AERUGINOSA    Report Status 12/15/2018 FINAL  Final   Organism ID, Bacteria PROTEUS MIRABILIS (A)  Final   Organism ID, Bacteria ENTEROCOCCUS FAECALIS (A)  Final   Organism ID, Bacteria PSEUDOMONAS AERUGINOSA (A)  Final      Susceptibility   Enterococcus faecalis - MIC*    AMPICILLIN <=2 SENSITIVE Sensitive     VANCOMYCIN 4 SENSITIVE Sensitive     GENTAMICIN SYNERGY SENSITIVE Sensitive     * 50,000 COLONIES/mL ENTEROCOCCUS FAECALIS   Pseudomonas aeruginosa - MIC*    CEFTAZIDIME 32 RESISTANT Resistant     CIPROFLOXACIN <=0.25 SENSITIVE Sensitive     GENTAMICIN 4 SENSITIVE Sensitive     IMIPENEM 2 SENSITIVE Sensitive     PIP/TAZO <=4 SENSITIVE Sensitive     CEFEPIME INTERMEDIATE Intermediate     * 80,000 COLONIES/mL PSEUDOMONAS AERUGINOSA   Proteus mirabilis - MIC*    AMPICILLIN <=2 SENSITIVE Sensitive     CEFAZOLIN 8 SENSITIVE Sensitive     CEFEPIME <=1 SENSITIVE Sensitive     CEFTAZIDIME <=1 SENSITIVE Sensitive     CEFTRIAXONE <=1 SENSITIVE Sensitive     CIPROFLOXACIN <=0.25 SENSITIVE Sensitive     GENTAMICIN 8 INTERMEDIATE Intermediate     IMIPENEM 8 INTERMEDIATE Intermediate     TRIMETH/SULFA >=320 RESISTANT Resistant     AMPICILLIN/SULBACTAM <=2 SENSITIVE Sensitive     PIP/TAZO <=4 SENSITIVE Sensitive     * >=100,000 COLONIES/mL PROTEUS MIRABILIS  Blood Culture (routine x 2)     Status: None (Preliminary result)   Collection Time: 12/15/18  1:41 PM  Result Value Ref Range Status   Specimen Description   Final    BLOOD PORTA CATH Performed at [facility], [place] [address] [place], [place] [address]    Special Requests   Final    BOTTLES DRAWN AEROBIC AND ANAEROBIC Blood Culture  adequate volume Performed at [facility] [address] [place], [place] [address]    Culture   Final    NO GROWTH < 24 HOURS Performed at [facility] [address] [place], [place] [address]    Report Status PENDING  Incomplete  Blood Culture (routine x 2)     Status: None (Preliminary result)   Collection Time: 12/15/18  1:41 PM  Result Value Ref Range Status   Specimen  Description BLOOD RIGHT ANTECUBITAL  Final   Special Requests   Final    BOTTLES DRAWN AEROBIC AND ANAEROBIC Blood Culture adequate volume Performed at [facility] [address], [place], [place] [address]    Culture  Setup Time   Final    GRAM NEGATIVE RODS AEROBIC BOTTLE ONLY CRITICAL RESULT CALLED TO, READ BACK BY AND VERIFIED WITH: [place], [place] [phone] FCP    Culture   Final    NO GROWTH < 24 HOURS Performed at [facility], [place] [address] [place], [place] [address]    Report Status PENDING  Incomplete  Blood Culture ID Panel (Reflexed)     Status: Abnormal   Collection Time: 12/15/18  1:41 PM  Result Value Ref Range Status   Enterococcus species NOT DETECTED NOT DETECTED Final   Listeria monocytogenes NOT DETECTED NOT DETECTED Final   Staphylococcus species NOT DETECTED NOT DETECTED Final   Staphylococcus aureus (BCID) NOT DETECTED NOT DETECTED Final   Streptococcus species NOT DETECTED NOT DETECTED Final   Streptococcus agalactiae NOT DETECTED NOT DETECTED Final   Streptococcus pneumoniae NOT DETECTED NOT DETECTED Final   Streptococcus pyogenes NOT DETECTED NOT DETECTED Final   Acinetobacter baumannii NOT DETECTED NOT DETECTED Final   Enterobacteriaceae species NOT DETECTED NOT DETECTED Final   Enterobacter cloacae complex NOT DETECTED NOT DETECTED Final   Escherichia coli NOT DETECTED NOT DETECTED Final   Klebsiella oxytoca NOT DETECTED NOT DETECTED Final   Klebsiella pneumoniae NOT DETECTED NOT DETECTED Final   Proteus  species NOT DETECTED NOT DETECTED Final   Serratia marcescens NOT DETECTED NOT DETECTED Final   Carbapenem resistance NOT DETECTED NOT DETECTED Final   Haemophilus influenzae NOT DETECTED NOT DETECTED Final   Neisseria meningitidis NOT DETECTED NOT DETECTED Final   Pseudomonas aeruginosa DETECTED (A) NOT DETECTED Final    Comment: CRITICAL RESULT CALLED TO, READ BACK BY AND VERIFIED WITH: Lyndel Safe, ERIN [id number] FC    Candida albicans NOT DETECTED NOT DETECTED Final   Candida glabrata NOT DETECTED NOT DETECTED Final   Candida krusei NOT DETECTED NOT DETECTED Final   Candida parapsilosis NOT DETECTED NOT DETECTED Final   Candida tropicalis NOT DETECTED NOT DETECTED Final  Urine culture     Status: Abnormal   Collection Time: 12/15/18  3:15 PM  Result Value Ref Range Status   Specimen Description   Final    URINE, RANDOM Performed at [facility], [place] [address] [place], [place] [address]    Special Requests   Final    NONE Performed at [facility], [place] [address] [place], [place] [address]    Culture MULTIPLE SPECIES PRESENT, SUGGEST RECOLLECTION (A)  Final   Report Status 12/17/2018 FINAL  Final  MRSA PCR Screening     Status: None   Collection Time: 12/16/18  1:35 PM  Result Value Ref Range Status   MRSA by PCR NEGATIVE NEGATIVE Final    Comment:        The GeneXpert MRSA Assay (FDA approved for NASAL specimens only), is one component of a comprehensive MRSA colonization surveillance program. It is not intended to diagnose MRSA infection nor to guide or monitor treatment for MRSA infections. Performed at [facility], [place] [address], [place], [place] [address]       Radiology Studies: Ct Abdomen Pelvis Wo Contrast  Result Date: 12/15/2018 CLINICAL DATA:  Abdominal pain, fever. EXAM: CT ABDOMEN AND PELVIS WITHOUT CONTRAST  TECHNIQUE: Multidetector CT imaging of the abdomen and pelvis was performed  following the standard protocol without IV contrast. COMPARISON:  11/01/2018 FINDINGS: Lower chest: Nodule in the peripheral right middle lobe measures 7 mm on image 8. This is new since PET CT from 10/09/2018. No effusions. Heart is normal size. Hepatobiliary: No focal hepatic abnormality. Gallbladder unremarkable. Pancreas: No focal abnormality or ductal dilatation. Spleen: No focal abnormality.  Normal size. Adrenals/Urinary Tract: Bilateral nephrostomy catheters in place. Right ureteral stent in place. Previously seen left perinephric fluid has decreased. No hydronephrosis currently. Adrenal glands and urinary bladder unremarkable. Stomach/Bowel: Stomach, large and small bowel grossly unremarkable. Vascular/Lymphatic: No evidence of aneurysm or adenopathy. Reproductive: Difficult to assess lower pelvic structures without intravenous and oral contrast. Other: No visible free fluid or free air. Musculoskeletal: No acute bony abnormality. IMPRESSION: Bilateral nephrostomy catheters in place with no hydronephrosis. Decreasing left perinephric fluid since prior study. Right ureteral stent in place. New 7 mm nodule in the peripheral right lung along the minor fissure. Cannot exclude metastasis. No definite acute process. Electronically Signed   By: Rolm Baptise M.D.   On: 12/15/2018 19:15   Dg Chest Port 1 View  Result Date: 12/15/2018 CLINICAL DATA:  Patient with diarrhea.  Back pain. EXAM: PORTABLE CHEST 1 VIEW COMPARISON:  Chest radiograph 01/25/2010 FINDINGS: Monitoring leads overlie the patient. Right anterior chest wall Port-A-Cath is present with tip projecting over the superior vena cava. Cardiomegaly. Bilateral interstitial pulmonary opacities. Low lung volumes. No large area pulmonary consolidation. No pleural effusion or pneumothorax. IMPRESSION: Bilateral interstitial opacities may represent mild pulmonary edema. Electronically Signed   By: Lovey Newcomer M.D.   On: 12/15/2018 14:34     Medications:    Scheduled: . bictegravir-emtricitabine-tenofovir AF  1 tablet Oral Daily  . hydrocortisone sod succinate (SOLU-CORTEF) inj  100 mg Intravenous Q6H  . levothyroxine  25 mcg Oral Q0600  . morphine  30 mg Oral Q12H  . vancomycin variable dose per unstable renal function (pharmacist dosing)   Does not apply See admin instructions   Continuous: . sodium chloride 125 mL/hr at 12/17/18 0152  . sodium chloride    . piperacillin-tazobactam (ZOSYN)  IV 2.25 g (12/17/18 0807)   JHE:RDEYCX chloride, acetaminophen **OR** acetaminophen, HYDROmorphone (DILAUDID) injection    Assessment/Plan:  Severe sepsis secondary to urinary tract infection/Pseudomonas bacteremia CT scan of the abdomen and pelvis did not show any acute findings.  No hydronephrosis.  The fluid collections noted previously had decreased in size.  Patient has not had any further episodes of diarrhea.  The only positive infectious finding was an abnormal UA.  So this is most likely UTI.  Patient's blood culture also growing Pseudomonas. She had urine cultures done on 2/2.  Report was reviewed.  She grew Proteus, enterococcus and Pseudomonas. Patient remains on vancomycin and Zosyn which will be continued for now.  Patient also receiving stress dose steroids.  Lactic acid level was normal.  Procalcitonin was significantly elevated at 63.5.  Improved to 53 this morning.  Discussed with Dr. Megan Salon with infectious disease who will consult.  Transient hypotension This was in the setting of narcotics.  Lactic acid level was normal.  We will cut back on the dose of her long-acting morphine.  Patient also found to have acute DVT in the right leg.  It is possible she may have had a pulmonary embolism although she was never hypoxic.  Never had any respiratory issues.  Continue with IV fluids for now.  Continue  with stress dose steroids.  Will consider TED stockings which may help with venous return.  Blood pressure values in the outpatient visits  reviewed.  Usually runs about 244 systolic.  Acute renal failure with normal anion gap metabolic acidosis CT scan did not show any hydronephrosis.  Acute renal failure most likely multifactorial including sepsis, hypovolemia, hypotension.  Blood pressure as discussed above.  Stable for the most part.  Creatinine improved slightly to 3.75.  She still not putting out much urine in her nephrostomies.  No evidence for obstruction on the CT scan.  Bicarbonate level has improved. If renal function continues to get worse that would portend a poor prognosis.  Patient is likely not a candidate for renal replacement treatments.  History of hydronephrosis and bilateral percutaneous nephrostomy CT scan showed these to be stable.  The fluid collection seen previously had decreased.  The nephrostomies were exchanged on 2/3.  Continue to monitor for now.  See above as well.  Hyponatremia and hypokalemia Sodium and potassium levels are noted to be normal this morning.  Continue to monitor.    Pancytopenia Absolute neutrophil count has improved.  Platelet counts are stable.  Low platelet counts could be due to consumption as well now that she has been found to have a DVT.  Hemoglobin low but stable.  No evidence of overt bleeding.  Marland Kitchen  Hyperbilirubinemia Reason for this is not entirely clear.  Bilirubin was fractionated and appears to be mostly direct.  CT scan did not show any abnormalities in the hepatobiliary system.  Could be related to sepsis or medications.  Improved this morning..  Stage IV cervical cancer Followed by medical oncology.  Receiving chemotherapy.  Last dose on January 30.  Medical oncology is following.  Palliative medicine has also been consulted.  Cancer associated pain Continue with her long-acting morphine.  However will reduce the dose as it could be contributing to hypotension.   History of HIV Continue with antiretroviral treatment  Acute right lower extremity DVT Her right leg  is larger than the left.  Apparently this has been present for a week or 2.  Doppler studies preliminarily suggest acute DVT in the right leg.  Will discuss with hematology but should be okay to place her on intravenous heparin.   Acute diarrhea Has not had any further episodes in the hospital.  This was most likely due to chemotherapy.  Very low suspicion for C. Difficile.   DVT Prophylaxis: Will likely need to initiate intravenous heparin. Code Status: Patient has elected to be full code during this hospitalization.   Family Communication: Discussed with the patient.  No family at bedside Disposition Plan: Management as outlined above.  Continue stepdown status till her blood pressure has stabilized some more.      LOS: 2 days   [facility] Pager on [email or web address]  12/17/2018, 8:42 AM

## 2018-12-17 NOTE — Consult Note (Signed)
[facility] for Infectious Disease    Date of Admission:  12/15/2018           Day 3 piperacillin tazobactam       Reason for Consult: UTI complicated by Pseudomonas bacteremia and sepsis    Referring Provider: Dr. Bonnielee Haff  Assessment: I strongly suspect that her acute illness is due to a urinary tract infection caused by Pseudomonas.  She is improving on piperacillin tazobactam and I would continue it pending antibiotic susceptibility results.  We will continue Biktarvy and repeat her CD4 count and HIV viral load.  Plan: 1. Continue piperacillin tazobactam 2. Continue Biktarvy 3. Check CD4 count and HIV viral load  Active Problems:   Complicated UTI (urinary tract infection)   Gram-negative bacteremia   Pseudomonas infection   Primary cervical cancer with metastasis - Stage IV   HIV (human immunodeficiency virus infection) (HCC)   Pancytopenia, acquired ([place])   Hydronephrosis   Port-A-Cath in place   AKI (acute kidney injury) ([place])   Sepsis ([place])   Acute diarrhea   Hyperbilirubinemia   Metabolic acidosis   Pressure injury of skin   Scheduled Meds: . hydrocortisone sod succinate (SOLU-CORTEF) inj  100 mg Intravenous Q6H  . levothyroxine  25 mcg Oral Q0600  . morphine  30 mg Oral Q12H  . vancomycin variable dose per unstable renal function (pharmacist dosing)   Does not apply See admin instructions   Continuous Infusions: . sodium chloride 100 mL/hr at 12/17/18 1125  . sodium chloride    . heparin 1,150 Units/hr (12/17/18 1600)  . piperacillin-tazobactam (ZOSYN)  IV 100 mL/hr at 12/17/18 1600   PRN Meds:.sodium chloride, acetaminophen **OR** acetaminophen, HYDROmorphone (DILAUDID) injection  HPI: Misty Thomas is a 53 y.o. female with metastatic cervical cancer causing ureteral obstruction.  She has bilateral nephrostomy tubes and a right ureteral stent and has been undergoing chemotherapy.  She was admitted recently with fever, sepsis and acute  kidney injury.  Her urinalysis was abnormal.  Urine culture grew multiple species.  One of 2 admission blood cultures has grown pseudomonas aeruginosa with susceptibilities pending.  Pseudomonas was isolated on a previous urine culture on 12/09/2018.  That isolate was resistant to cephalosporins but sensitive to piperacillin tazobactam.  She has defervesced and seems to be improving.  Abdominal CT scan was performed and showed a decrease in left perinephric stranding and fluid that was seen on a prior study.   She also has HIV infection.  She has been on Biktarvy under the care of my partner, Dr. Talbot Grumbling.  Blood work in August showed that her viral load was undetectable at less than 20 and her CD4 count was 130.    Review of Systems: Review of Systems  Constitutional: Negative for chills and fever.  Gastrointestinal: Positive for abdominal pain.    Past Medical History:  Diagnosis Date  . Acquired pancytopenia ([place]) 03/2018  . Anemia    Mild  . Cancer associated pain   . Cervical cancer [facility]) oncologist-  dr gorsuch/  dr Sondra Come   dx 01-04-2017-- Stage IIIB,  cercial adenocarcinoma w/ local invasion perirectal area-- chemo started 02-09-2018 and external beam radiation completed 03/16/2018 ,  started brachytherapy boost high dose radiation 03-20-2018  . Chemotherapy induced nausea and vomiting   . Diabetes mellitus type 2, diet-controlled ([place])    pt. denies No meds  . Frequency of urination   . History of cellulitis 2018   bilateral  lower leg w/ mrsa  . History of external beam radiation therapy 02-05-2018  to 03-16-2018   cervical cancer  . History of MRSA infection 2018   w/ bilateral lower leg cellulitis  . HIV (human immunodeficiency virus infection) ([place])    asymptomatic  . Hypomagnesemia 03/2018   Severe---- takes oral magnesium and IV magnesium as needed (cancer center)  . Intermittent diarrhea    due to radiation/ chemo  . Nocturia   . Port-A-Cath in place    Power port    . Radiation burn    LOWER ABD.  04-06-2018  per is healing  . Subclinical hypothyroidism    w/ thyroiditis , dx 01-22-2018 PET scan  . Thrombocytopenia ([place])   . Wears glasses     Social History   Tobacco Use  . Smoking status: Never Smoker  . Smokeless tobacco: Never Used  Substance Use Topics  . Alcohol use: Not Currently    Frequency: Never  . Drug use: No    Family History  Problem Relation Age of Onset  . Cancer Maternal Grandmother        unknown ca   No Known Allergies  OBJECTIVE: Blood pressure (!) 83/48, pulse 70, temperature 98.2 F (36.8 C), temperature source Oral, resp. rate 11, height 5\' 1"  (1.549 m), weight 77 kg, last menstrual period 02/03/2018, SpO2 96 %.  Physical Exam Constitutional:      Comments: She is resting quietly in bed.  Cardiovascular:     Rate and Rhythm: Normal rate and regular rhythm.     Heart sounds: No murmur.  Pulmonary:     Effort: Pulmonary effort is normal.     Breath sounds: Normal breath sounds.  Abdominal:     Palpations: Abdomen is soft.     Tenderness: There is no abdominal tenderness.     Lab Results Lab Results  Component Value Date   WBC 7.5 12/17/2018   HGB 8.6 (L) 12/17/2018   HCT 27.0 (L) 12/17/2018   MCV 89.1 12/17/2018   PLT 78 (L) 12/17/2018    Lab Results  Component Value Date   CREATININE 3.75 (H) 12/17/2018   BUN 61 (H) 12/17/2018   NA 135 12/17/2018   K 3.9 12/17/2018   CL 102 12/17/2018   CO2 20 (L) 12/17/2018    Lab Results  Component Value Date   ALT 20 12/17/2018   AST 14 (L) 12/17/2018   ALKPHOS 106 12/17/2018   BILITOT 2.3 (H) 12/17/2018     Microbiology: Recent Results (from the past 240 hour(s))  Urine culture     Status: Abnormal   Collection Time: 12/09/18  9:03 AM  Result Value Ref Range Status   Specimen Description KIDNEY RIGHT  Final   Special Requests   Final    NONE Performed at [facility], [place] [address] [place], [place] [address]    Culture (A)  Final     >=100,000 COLONIES/mL PROTEUS MIRABILIS 50,000 COLONIES/mL ENTEROCOCCUS FAECALIS 80,000 COLONIES/mL PSEUDOMONAS AERUGINOSA    Report Status 12/15/2018 FINAL  Final   Organism ID, Bacteria PROTEUS MIRABILIS (A)  Final   Organism ID, Bacteria ENTEROCOCCUS FAECALIS (A)  Final   Organism ID, Bacteria PSEUDOMONAS AERUGINOSA (A)  Final      Susceptibility   Enterococcus faecalis - MIC*    AMPICILLIN <=2 SENSITIVE Sensitive     VANCOMYCIN 4 SENSITIVE Sensitive     GENTAMICIN SYNERGY SENSITIVE Sensitive     * 50,000 COLONIES/mL ENTEROCOCCUS FAECALIS  Pseudomonas aeruginosa - MIC*    CEFTAZIDIME 32 RESISTANT Resistant     CIPROFLOXACIN <=0.25 SENSITIVE Sensitive     GENTAMICIN 4 SENSITIVE Sensitive     IMIPENEM 2 SENSITIVE Sensitive     PIP/TAZO <=4 SENSITIVE Sensitive     CEFEPIME INTERMEDIATE Intermediate     * 80,000 COLONIES/mL PSEUDOMONAS AERUGINOSA   Proteus mirabilis - MIC*    AMPICILLIN <=2 SENSITIVE Sensitive     CEFAZOLIN 8 SENSITIVE Sensitive     CEFEPIME <=1 SENSITIVE Sensitive     CEFTAZIDIME <=1 SENSITIVE Sensitive     CEFTRIAXONE <=1 SENSITIVE Sensitive     CIPROFLOXACIN <=0.25 SENSITIVE Sensitive     GENTAMICIN 8 INTERMEDIATE Intermediate     IMIPENEM 8 INTERMEDIATE Intermediate     TRIMETH/SULFA >=320 RESISTANT Resistant     AMPICILLIN/SULBACTAM <=2 SENSITIVE Sensitive     PIP/TAZO <=4 SENSITIVE Sensitive     * >=100,000 COLONIES/mL PROTEUS MIRABILIS  Blood Culture (routine x 2)     Status: None (Preliminary result)   Collection Time: 12/15/18  1:41 PM  Result Value Ref Range Status   Specimen Description   Final    BLOOD PORTA CATH Performed at [facility] [address] [place], [place] [address]    Special Requests   Final    BOTTLES DRAWN AEROBIC AND ANAEROBIC Blood Culture adequate volume Performed at [facility] [address], [place], [place] [address]    Culture   Final    NO GROWTH 2 DAYS Performed at  [facility] [address] [place], [place] [address]    Report Status PENDING  Incomplete  Blood Culture (routine x 2)     Status: Abnormal (Preliminary result)   Collection Time: 12/15/18  1:41 PM  Result Value Ref Range Status   Specimen Description BLOOD RIGHT ANTECUBITAL  Final   Special Requests   Final    BOTTLES DRAWN AEROBIC AND ANAEROBIC Blood Culture adequate volume Performed at [facility] [address] [place], [place] [address]    Culture  Setup Time   Final    GRAM NEGATIVE RODS AEROBIC BOTTLE ONLY CRITICAL RESULT CALLED TO, READ BACK BY AND VERIFIED WITH: [place], [place] [phone] FCP    Culture (A)  Final    PSEUDOMONAS AERUGINOSA SUSCEPTIBILITIES TO FOLLOW Performed at [facility], [place] [address] [place], [place] [address]    Report Status PENDING  Incomplete  Blood Culture ID Panel (Reflexed)     Status: Abnormal   Collection Time: 12/15/18  1:41 PM  Result Value Ref Range Status   Enterococcus species NOT DETECTED NOT DETECTED Final   Listeria monocytogenes NOT DETECTED NOT DETECTED Final   Staphylococcus species NOT DETECTED NOT DETECTED Final   Staphylococcus aureus (BCID) NOT DETECTED NOT DETECTED Final   Streptococcus species NOT DETECTED NOT DETECTED Final   Streptococcus agalactiae NOT DETECTED NOT DETECTED Final   Streptococcus pneumoniae NOT DETECTED NOT DETECTED Final   Streptococcus pyogenes NOT DETECTED NOT DETECTED Final   Acinetobacter baumannii NOT DETECTED NOT DETECTED Final   Enterobacteriaceae species NOT DETECTED NOT DETECTED Final   Enterobacter cloacae complex NOT DETECTED NOT DETECTED Final   Escherichia coli NOT DETECTED NOT DETECTED Final   Klebsiella oxytoca NOT DETECTED NOT DETECTED Final   Klebsiella pneumoniae NOT DETECTED NOT DETECTED Final   Proteus species NOT DETECTED NOT DETECTED Final   Serratia marcescens NOT DETECTED NOT DETECTED Final   Carbapenem resistance  NOT  DETECTED NOT DETECTED Final   Haemophilus influenzae NOT DETECTED NOT DETECTED Final   Neisseria meningitidis NOT DETECTED NOT DETECTED Final   Pseudomonas aeruginosa DETECTED (A) NOT DETECTED Final    Comment: CRITICAL RESULT CALLED TO, READ BACK BY AND VERIFIED WITH: Lyndel Safe, ERIN [id number] FC    Candida albicans NOT DETECTED NOT DETECTED Final   Candida glabrata NOT DETECTED NOT DETECTED Final   Candida krusei NOT DETECTED NOT DETECTED Final   Candida parapsilosis NOT DETECTED NOT DETECTED Final   Candida tropicalis NOT DETECTED NOT DETECTED Final  Urine culture     Status: Abnormal   Collection Time: 12/15/18  3:15 PM  Result Value Ref Range Status   Specimen Description   Final    URINE, RANDOM Performed at [facility], [place] [address], [place], [place] [address]    Special Requests   Final    NONE Performed at [facility], [place] [address], [place], [place] [address]    Culture MULTIPLE SPECIES PRESENT, SUGGEST RECOLLECTION (A)  Final   Report Status 12/17/2018 FINAL  Final  MRSA PCR Screening     Status: None   Collection Time: 12/16/18  1:35 PM  Result Value Ref Range Status   MRSA by PCR NEGATIVE NEGATIVE Final    Comment:        The GeneXpert MRSA Assay (FDA approved for NASAL specimens only), is one component of a comprehensive MRSA colonization surveillance program. It is not intended to diagnose MRSA infection nor to guide or monitor treatment for MRSA infections. Performed at [facility], [place] [address], [place], [place] [address]     Michel Bickers, [place] [facility] [employer] [phone] pager   [phone] cell 12/17/2018, 5:35 PM

## 2018-12-17 NOTE — Progress Notes (Signed)
Pt was hypotensive overnight, and continued to be this morning. RN spoke with Dr. Maryland Pink about blood pressure and he said he was ok with current BP and just to keep MAP >55.

## 2018-12-17 NOTE — Progress Notes (Signed)
Pharmacy Antibiotic Note  Misty Thomas is a 53 y.o. female admitted on 12/15/2018 with sepsis from suspected intra-abdominal source.  Pharmacy has been consulted for vancomycin and zosyn dosing.  Plan:  Vancomycin dosing per renal function.  Check level after ~48hr if therapy continues  Zosyn 2.25g IV q8h for CrCl < 20 ml/min  Follow up ID consult, renal function, & cultures  Height: 5\' 1"  (154.9 cm) Weight: 169 lb 12.1 oz (77 kg) IBW/kg (Calculated) : 47.8  Temp (24hrs), Avg:97.4 F (36.3 C), Min:97.2 F (36.2 C), Max:97.7 F (36.5 C)  Recent Labs  Lab 12/14/18 1131 12/15/18 1323 12/15/18 1341 12/15/18 1534 12/16/18 0346 12/16/18 0859 12/16/18 1349 12/17/18 0340  WBC 1.6* 2.2*  --   --  3.1*  --   --  7.5  CREATININE 2.90* 3.44*  --   --  3.93*  --   --  3.75*  LATICACIDVEN  --   --  1.4 1.1  --  0.8 0.4*  --     Estimated Creatinine Clearance: 16.5 mL/min (A) (by C-G formula based on SCr of 3.75 mg/dL (H)).    No Known Allergies  Antimicrobials this admission: 2/2 PTA Augmentin x 7 days >> 2/8 2/8 Cefepime x 1 2/8 Vancomycin >> 2/8 Zosyn >>  Dose adjustments this admission: 2/10 18:00 Vanc random level = _____  (48 hrs after first dose)  Microbiology results: 2/8 BCx (peripheral): 1 of 2 GNR (BCID = Pseudomonas) 2/8 BCx (port): ngtd 2/8 UCx: sent 2/8 C.diff: cancelled 2/9 MRSA PCR: neg   Thank you for allowing pharmacy to be a part of this patient's care.  Gretta Arab PharmD, BCPS Pager [phone] 12/17/2018 2:54 PM

## 2018-12-17 NOTE — Progress Notes (Signed)
Pharmacy Antibiotic Note  Misty Thomas is a 53 y.o. female admitted on 12/15/2018 with sepsis from suspected intra-abdominal source.  Pharmacy has been consulted for vancomycin and zosyn dosing.  Vancomycin random level = 57mcg/ml; therefore will redose as level < 20 mcg/ml  Plan:  Vancomycin 750mg  IV x 1 tonight  Blood cx 1/2 +pseudomonas  MRSA nasal = negative  ID consult stated to continue Zosyn  Need to continue Vancomycin?  Zosyn 2.25g IV q8h for CrCl < 20 ml/min  Height: 5\' 1"  (154.9 cm) Weight: 169 lb 12.1 oz (77 kg) IBW/kg (Calculated) : 47.8  Temp (24hrs), Avg:97.6 F (36.4 C), Min:97.2 F (36.2 C), Max:98.2 F (36.8 C)  Recent Labs  Lab 12/14/18 1131 12/15/18 1323 12/15/18 1341 12/15/18 1534 12/16/18 0346 12/16/18 0859 12/16/18 1349 12/17/18 0340 12/17/18 1858  WBC 1.6* 2.2*  --   --  3.1*  --   --  7.5  --   CREATININE 2.90* 3.44*  --   --  3.93*  --   --  3.75*  --   LATICACIDVEN  --   --  1.4 1.1  --  0.8 0.4*  --   --   VANCORANDOM  --   --   --   --   --   --   --   --  15    Estimated Creatinine Clearance: 16.5 mL/min (A) (by C-G formula based on SCr of 3.75 mg/dL (H)).    No Known Allergies  Antimicrobials this admission: 2/2 PTA Augmentin x 7 days >> 2/8 2/8 Cefepime x 1 2/8 Vancomycin >> 2/8 Zosyn >>  Dose adjustments this admission: 2/10 18:00 Vanc random level = 15 mcg/ml  (48 hrs after first dose)  Microbiology results: 2/8 BCx (peripheral): 1 of 2 GNR (BCID = Pseudomonas) 2/8 BCx (port): ngtd 2/8 UCx: sent 2/8 C.diff: cancelled 2/9 MRSA PCR: neg   Thank you for allowing pharmacy to be a part of this patient's care.  Leone Haven, PharmD 12/17/2018 7:51 PM

## 2018-12-17 NOTE — Care Management Note (Signed)
Case Management Note  Patient Details  Name: Misty Thomas MRN: [id number] Date of Birth: Nov 22, 1965  Subjective/Objective:                  Discharge Readiness Return to top of Urinary Tract Infection (UTI) RRG - [place]  Discharge readiness is indicated by patient meeting Recovery Milestones, including ALL of the following: ? Hemodynamic stabilityNO HYPOTENSIVE ? Fever absent or reduced YES ? Vomiting absent or improved YES ? Urine output adequate YES ? Renal function at baseline or acceptable for next level of care  ? NO BUN-61, CREATINE-3.75 ? Pain absent or managed MANAGED ? Ambulatory NO ? Oral hydration, medications,[L] and diet ? IV NS, IV ZOSYN, IV SOLU-CORTEF ? LEVEL OF CARE-INPATIENT SDU   Action/Plan: Will follow for progression of care and clinical status. Will follow for case management needs none present at this time.  Expected Discharge Date:                  Expected Discharge Plan:     In-House Referral:     Discharge planning Services     Post Acute Care Choice:    Choice offered to:     DME Arranged:    DME Agency:     HH Arranged:    HH Agency:     Status of Service:     If discussed at [employer], dates discussed:    Additional Comments:  Leeroy Cha, RN 12/17/2018, 10:19 AM

## 2018-12-17 NOTE — Progress Notes (Signed)
Pt had orders to be started on heparin gtt. Prior to starting RN and NT noticed hint of blood in stool. RN notified pharmacists and she said it was still ok to start heparin, just to monitor pt throughout day.

## 2018-12-17 NOTE — Progress Notes (Addendum)
ANTICOAGULATION CONSULT NOTE - Initial Consult  Pharmacy Consult for Heparin Indication: DVT  No Known Allergies  Patient Measurements: Height: 5\' 1"  (154.9 cm) Weight: 169 lb 12.1 oz (77 kg) IBW/kg (Calculated) : 47.8 Heparin Dosing Weight: 65 kg  Vital Signs: Temp: 97.7 F (36.5 C) (02/10 0800) Temp Source: Oral (02/10 0800) BP: 90/55 (02/10 0900) Pulse Rate: 69 (02/10 0900)  Labs: Recent Labs    12/15/18 1323 12/16/18 0346 12/17/18 0340  HGB 8.0* 9.1* 8.6*  HCT 25.3* 28.9* 27.0*  PLT 93* 79* 78*  LABPROT  --  17.2*  --   INR  --  1.42  --   CREATININE 3.44* 3.93* 3.75*    Estimated Creatinine Clearance: 16.5 mL/min (A) (by C-G formula based on SCr of 3.75 mg/dL (H)).   Medical History: Past Medical History:  Diagnosis Date  . Acquired pancytopenia ([place]) 03/2018  . Anemia    Mild  . Cancer associated pain   . Cervical cancer [facility]) oncologist-  dr gorsuch/  dr Sondra Come   dx 01-04-2017-- Stage IIIB,  cercial adenocarcinoma w/ local invasion perirectal area-- chemo started 02-09-2018 and external beam radiation completed 03/16/2018 ,  started brachytherapy boost high dose radiation 03-20-2018  . Chemotherapy induced nausea and vomiting   . Diabetes mellitus type 2, diet-controlled ([place])    pt. denies No meds  . Frequency of urination   . History of cellulitis 2018   bilateral lower leg w/ mrsa  . History of external beam radiation therapy 02-05-2018  to 03-16-2018   cervical cancer  . History of MRSA infection 2018   w/ bilateral lower leg cellulitis  . HIV (human immunodeficiency virus infection) ([place])    asymptomatic  . Hypomagnesemia 03/2018   Severe---- takes oral magnesium and IV magnesium as needed (cancer center)  . Intermittent diarrhea    due to radiation/ chemo  . Nocturia   . Port-A-Cath in place    Power port  . Radiation burn    LOWER ABD.  04-06-2018  per is healing  . Subclinical hypothyroidism    w/ thyroiditis , dx 01-22-2018 PET scan   . Thrombocytopenia ([place])   . Wears glasses     Medications:  Infusions:  . sodium chloride 125 mL/hr at 12/17/18 0152  . sodium chloride    . piperacillin-tazobactam (ZOSYN)  IV Stopped (12/17/18 4259)    Assessment: 34 yoF admitted on 2/8 with weakness, diarrhea, AKI, hypotension, pancytopenia, and fever.  PMH significant for stage IV cervical cancer (last chemo 12/06/18), HIV, hydronephrosis s/p ureteral stent and bilateral PCN.  She is now found to have acute DVT in the right leg.  Pharmacy is consulted to dose Heparin IV.  Baseline INR 1.42 No PTA anticoagulation No heparin or lovenox used inpatient for VTE prophylaxis.  Today, 12/17/2018:  SCr 3.75, small decreased today.    Nephrostomy tubes last exchanged 2/3.  CT shows no obstruction.  CBC: Hgb 8.6, Plt 78 (remains low/stable).    Per Dr. Alvy Bimler, "Pancytopenia is multifactorial, likely due to side effects of chemotherapy (last on **) and renal failure."   Last transfusion on 12/14/18.  Last Filgrastim dose on 12/28 (ordered 2/9, but then d/c)  No current bleeding or complications reported.  Addendum:  RN and NT noticed hint of blood in stool.  Continue to monitor closely.   Goal of Therapy:  Heparin level 0.3-0.7 units/ml Monitor platelets by anticoagulation protocol: Yes   Plan:   Give heparin 1800 units bolus IV x 1  Start heparin IV infusion at 1150 units/hr  Heparin level 8 hours after starting  Daily heparin level and CBC  Continue to monitor H&H and platelets  Gretta Arab PharmD, BCPS Pager [phone] 12/17/2018 10:45 AM

## 2018-12-17 NOTE — Progress Notes (Signed)
PMT no charge note  Patient using bedside commode, seen briefly, no pain.   BP (!) 90/57   Pulse 78   Temp (!) 97.4 F (36.3 C) (Oral)   Resp 11   Ht 5\' 1"  (1.549 m)   Wt 77 kg   LMP 02/03/2018   SpO2 99%   BMI 32.07 kg/m  Labs and imaging noted Medication history reviewed.  Med onc following.   Patient admitted with AKI, diarrhea, bacteremia, she has stage IV cervical cancer, HIV, pancytopenia. PMT following for pain management needs.   Continue current mode of care.  PMT to see prn.   Loistine Chance MD [place] palliative medicine team [phone]

## 2018-12-17 NOTE — Progress Notes (Signed)
Bilateral lower extremity venous duplex has been completed. Preliminary results can be found in CV Proc through chart review.  Results were given to the patient's nurse, Hildred Alamin.  12/17/18 8:55 AM Carlos Levering RVT

## 2018-12-17 NOTE — Progress Notes (Signed)
Pt had BM with significant amount of blood. Dr. Maryland Pink notified, he gave orders to hold heparin and do a H&H. Pharmacy notified of the heparin being held and they d/c the heparin lab draw. Will continue to monitor.

## 2018-12-17 NOTE — Progress Notes (Signed)
Misty Thomas   DOB:01-Jul-1966   EP#:[id number]    Assessment & Plan:   Primary cervical cancer with metastasis - Stage IV She tolerated treatment poorly with major side effects including diarrhea and pancytopenia She has received chemotherapy a week ago.  Continue supportive care   Cancer associated pain This is stable.  She will continue prescribed medications for pain.  Pancytopenia, acquired ([place]) Multifactorial, likely due to side effects of chemotherapy and renal failure.  She has received blood transfusion in my office last week.  Continue close monitoring  AKI (acute kidney injury) ([place]) Multifactorial, related to recent sepsis.  Continue fluid resuscitation  Diarrhea She has intermittent diarrhea causing dehydration I recommend her to take Imodium I recommend IV fluid resuscitation  HIV (human immunodeficiency virus infection) ([place]) She will continue antiretroviral therapy  Bacteremia Likely related to her existing stent If it does not resolve with broad-spectrum IV antibiotics, would recommend port removal.  Discharge planning She is acutely ill She will likely be in the hospital for the next couple days I have cancelled her outpatient follow-up appointment and CT scan that were scheduled next week I will continue to follow  [name], MD 12/17/2018  8:37 AM   Subjective:  The patient is well-known to me.  She was admitted over the weekend due to sepsis.  She is currently in the stepdown unit.  She appears somewhat sleepy. CT imaging from 12/15/2018 showed reduced size of fluid collection.  The patient is currently on broad-spectrum IV antibiotics.  She stated that her pain is well controlled  Objective:  Vitals:   12/17/18 0640 12/17/18 0641  BP:    Pulse: 72 79  Resp: (!) 21 14  Temp:    SpO2: 98% 98%     Intake/Output Summary (Last 24 hours) at 12/17/2018 0837 Last data filed at 12/17/2018 0600 Gross per 24 hour  Intake 2990.9 ml  Output 550 ml   Net 2440.9 ml    GENERAL: She appears sleepy but arousable. SKIN: skin color, texture, turgor are normal, no rashes or significant lesions EYES: normal, Conjunctiva are pale and non-injected, sclera clear OROPHARYNX:no exudate, no erythema and lips, buccal mucosa, and tongue normal  NECK: supple, thyroid normal size, non-tender, without nodularity LYMPH:  no palpable lymphadenopathy in the cervical, axillary or inguinal LUNGS: clear to auscultation and percussion with normal breathing effort HEART: regular rate & rhythm and no murmurs and no lower extremity edema ABDOMEN:abdomen soft, non-tender and normal bowel sounds Musculoskeletal:no cyanosis of digits and no clubbing  NEURO: Somewhat sleepy   Labs:  Lab Results  Component Value Date   WBC 7.5 12/17/2018   HGB 8.6 (L) 12/17/2018   HCT 27.0 (L) 12/17/2018   MCV 89.1 12/17/2018   PLT 78 (L) 12/17/2018   NEUTROABS 6.7 12/17/2018    Lab Results  Component Value Date   NA 135 12/17/2018   K 3.9 12/17/2018   CL 102 12/17/2018   CO2 20 (L) 12/17/2018    Studies:  Ct Abdomen Pelvis Wo Contrast  Result Date: 12/15/2018 CLINICAL DATA:  Abdominal pain, fever. EXAM: CT ABDOMEN AND PELVIS WITHOUT CONTRAST TECHNIQUE: Multidetector CT imaging of the abdomen and pelvis was performed following the standard protocol without IV contrast. COMPARISON:  11/01/2018 FINDINGS: Lower chest: Nodule in the peripheral right middle lobe measures 7 mm on image 8. This is new since PET CT from 10/09/2018. No effusions. Heart is normal size. Hepatobiliary: No focal hepatic abnormality. Gallbladder unremarkable. Pancreas: No focal abnormality or  ductal dilatation. Spleen: No focal abnormality.  Normal size. Adrenals/Urinary Tract: Bilateral nephrostomy catheters in place. Right ureteral stent in place. Previously seen left perinephric fluid has decreased. No hydronephrosis currently. Adrenal glands and urinary bladder unremarkable. Stomach/Bowel: Stomach,  large and small bowel grossly unremarkable. Vascular/Lymphatic: No evidence of aneurysm or adenopathy. Reproductive: Difficult to assess lower pelvic structures without intravenous and oral contrast. Other: No visible free fluid or free air. Musculoskeletal: No acute bony abnormality. IMPRESSION: Bilateral nephrostomy catheters in place with no hydronephrosis. Decreasing left perinephric fluid since prior study. Right ureteral stent in place. New 7 mm nodule in the peripheral right lung along the minor fissure. Cannot exclude metastasis. No definite acute process. Electronically Signed   By: Rolm Baptise M.D.   On: 12/15/2018 19:15   Dg Chest Port 1 View  Result Date: 12/15/2018 CLINICAL DATA:  Patient with diarrhea.  Back pain. EXAM: PORTABLE CHEST 1 VIEW COMPARISON:  Chest radiograph 01/25/2010 FINDINGS: Monitoring leads overlie the patient. Right anterior chest wall Port-A-Cath is present with tip projecting over the superior vena cava. Cardiomegaly. Bilateral interstitial pulmonary opacities. Low lung volumes. No large area pulmonary consolidation. No pleural effusion or pneumothorax. IMPRESSION: Bilateral interstitial opacities may represent mild pulmonary edema. Electronically Signed   By: Lovey Newcomer M.D.   On: 12/15/2018 14:34

## 2018-12-18 ENCOUNTER — Other Ambulatory Visit: Payer: BLUE CROSS/BLUE SHIELD

## 2018-12-18 DIAGNOSIS — A419 Sepsis, unspecified organism: Secondary | ICD-10-CM

## 2018-12-18 LAB — BASIC METABOLIC PANEL
Anion gap: 12 (ref 5–15)
BUN: 67 mg/dL — ABNORMAL HIGH (ref 6–20)
CO2: 18 mmol/L — ABNORMAL LOW (ref 22–32)
Calcium: 7.3 mg/dL — ABNORMAL LOW (ref 8.9–10.3)
Chloride: 101 mmol/L (ref 98–111)
Creatinine, Ser: 3.61 mg/dL — ABNORMAL HIGH (ref 0.44–1.00)
GFR calc Af Amer: 16 mL/min — ABNORMAL LOW (ref >60)
GFR, EST NON AFRICAN AMERICAN: 14 mL/min — AB (ref >60)
Glucose, Bld: 215 mg/dL — ABNORMAL HIGH (ref 70–99)
Potassium: 3.9 mmol/L (ref 3.5–5.1)
Sodium: 131 mmol/L — ABNORMAL LOW (ref 135–145)

## 2018-12-18 LAB — PROCALCITONIN: PROCALCITONIN: 35.83 ng/mL

## 2018-12-18 LAB — CBC WITH DIFFERENTIAL/PLATELET
Abs Immature Granulocytes: 0.74 10*3/uL — ABNORMAL HIGH (ref 0.00–0.07)
Basophils Absolute: 0 10*3/uL (ref 0.0–0.1)
Basophils Relative: 0 %
Eosinophils Absolute: 0 10*3/uL (ref 0.0–0.5)
Eosinophils Relative: 0 %
HCT: 26.9 % — ABNORMAL LOW (ref 36.0–46.0)
Hemoglobin: 8.4 g/dL — ABNORMAL LOW (ref 12.0–15.0)
Immature Granulocytes: 6 %
Lymphocytes Relative: 4 %
Lymphs Abs: 0.5 10*3/uL — ABNORMAL LOW (ref 0.7–4.0)
MCH: 28.8 pg (ref 26.0–34.0)
MCHC: 31.2 g/dL (ref 30.0–36.0)
MCV: 92.1 fL (ref 80.0–100.0)
Monocytes Absolute: 0.3 10*3/uL (ref 0.1–1.0)
Monocytes Relative: 3 %
Neutro Abs: 10.8 10*3/uL — ABNORMAL HIGH (ref 1.7–7.7)
Neutrophils Relative %: 87 %
Platelets: 81 10*3/uL — ABNORMAL LOW (ref 150–400)
RBC: 2.92 MIL/uL — ABNORMAL LOW (ref 3.87–5.11)
RDW: 16.5 % — ABNORMAL HIGH (ref 11.5–15.5)
WBC: 12.5 10*3/uL — AB (ref 4.0–10.5)
nRBC: 0 % (ref 0.0–0.2)

## 2018-12-18 LAB — T-HELPER CELLS (CD4) COUNT (NOT AT ARMC)
CD4 % Helper T Cell: 5 % — ABNORMAL LOW (ref 33–55)
CD4 T Cell Abs: 30 /uL — ABNORMAL LOW (ref 400–2700)

## 2018-12-18 LAB — CULTURE, BLOOD (ROUTINE X 2): Special Requests: ADEQUATE

## 2018-12-18 MED ORDER — ENSURE ENLIVE PO LIQD
237.0000 mL | Freq: Three times a day (TID) | ORAL | Status: DC
  Administered 2018-12-18 – 2018-12-19 (×4): 237 mL via ORAL

## 2018-12-18 MED ORDER — ADULT MULTIVITAMIN W/MINERALS CH
1.0000 | ORAL_TABLET | Freq: Every day | ORAL | Status: DC
  Administered 2018-12-18 – 2018-12-19 (×2): 1 via ORAL
  Filled 2018-12-18 (×2): qty 1

## 2018-12-18 MED ORDER — SODIUM CHLORIDE 0.9% FLUSH
10.0000 mL | Freq: Two times a day (BID) | INTRAVENOUS | Status: DC
  Administered 2018-12-18 – 2018-12-19 (×2): 10 mL

## 2018-12-18 MED ORDER — CHLORHEXIDINE GLUCONATE CLOTH 2 % EX PADS
6.0000 | MEDICATED_PAD | Freq: Every day | CUTANEOUS | Status: DC
  Administered 2018-12-19: 6 via TOPICAL

## 2018-12-18 MED ORDER — SODIUM CHLORIDE 0.9% FLUSH: 10.0000 mL | INTRAVENOUS | Status: DC | PRN

## 2018-12-18 MED ORDER — SODIUM CHLORIDE 0.9 % IV SOLN
2.0000 g | INTRAVENOUS | Status: DC
  Administered 2018-12-18 – 2018-12-19 (×2): 2 g via INTRAVENOUS
  Filled 2018-12-18 (×2): qty 2

## 2018-12-18 NOTE — Progress Notes (Signed)
[facility] PROGRESS NOTE  CAMBRI PLOURDE GEX:[id number] DOB: 1966/10/11 DOA: 12/15/2018  PCP: Patient, No Pcp Per  Brief History/Interval Summary: 53 y.o. female with a past medical history of stage IV cervical cancer receiving chemotherapy, last chemotherapy was on January 30, h/o HIV on antiretroviral treatment, hydronephrosis, status post ureteral stent, status post bilateral percutaneous nephrostomy who presented with  complaints of diarrhea that started on the morning of admission.  She had 2 episodes of watery stool.  She felt weak and lightheaded.  In the emergency department patient was noted to be hypotensive with a blood pressure initially in the 60s.  Evaluation revealed an abnormal UA.  She was noted to be pancytopenic.  There was concern for infection in the urinary tract.  She was also found to have acute renal failure.    She was hospitalized.  Placed on antibiotics.  Patient did not improve despite active treatment.  Noted to have DVT.  Developed rectal bleeding with heparin.  Now de-escalating care.  Palliative medicine to assist with disposition.  Reason for Visit: Sepsis secondary to Pseudomonas bacteremia  Consultants: Medical oncology.  Palliative medicine.  Infectious disease.  Procedures: None  Antibiotics: Vancomycin and Zosyn Vancomycin discontinued  Subjective/Interval History: Patient noted to have rectal bleeding overnight.  She had 2 episodes of the same.  Her IV heparin was discontinued.  Patient denies any abdominal pain.  She does not know if she has had rectal bleeding previously.  Does not remember if she is ever had a colonoscopy but does not think she has had one.    ROS: Denies any shortness of breath.  No nausea or vomiting.  Objective:  Vital Signs  Vitals:   12/18/18 0400 12/18/18 0500 12/18/18 0600 12/18/18 0709  BP: (!) 88/51  (!) 92/58   Pulse: (!) 58 66 (!) 59   Resp: 10 13 (!) 5   Temp:    (!) 97.4 F (36.3 C)  TempSrc:     Axillary  SpO2: 96% 100% 100%   Weight:      Height:        Intake/Output Summary (Last 24 hours) at 12/18/2018 1025 Last data filed at 12/18/2018 0600 Gross per 24 hour  Intake 2984.91 ml  Output 325 ml  Net 2659.91 ml   Filed Weights   12/15/18 1336  Weight: 77 kg   General appearance: Fatigued.  No distress.   Resp: Clear to auscultation bilaterally.  Normal effort Cardio: S1-S2 is normal regular.  No S3-S4.  No rubs murmurs or bruit GI: Abdomen is soft.  Mildly tender in the flank area bilaterally.  No rebound rigidity or guarding.  No masses organomegaly Rectal exam: Patient has a fistula.  A lot of stool is coming from this fistula opening rather than the anal opening.  No obvious hemorrhoids noted externally.  Brown stool which is blood-tinged.   Extremities: Right leg is larger than left. Neurologic: Somewhat distracted.  No obvious focal neurological deficits.   Lab Results:  Data Reviewed: I have personally reviewed following labs and imaging studies  CBC: Recent Labs  Lab 12/14/18 1131 12/15/18 1323 12/15/18 1800 12/16/18 0346 12/17/18 0340 12/17/18 1858 12/18/18 0331  WBC 1.6* 2.2*  --  3.1* 7.5  --  12.5*  NEUTROABS 1.0*  --  1.6* 2.7 6.7  --  10.8*  HGB 5.9* 8.0*  --  9.1* 8.6* 8.8* 8.4*  HCT 18.5* 25.3*  --  28.9* 27.0* 27.5* 26.9*  MCV 91.6 91.7  --  90.3 89.1  --  92.1  PLT 125* 93*  --  79* 78*  --  81*    Basic Metabolic Panel: Recent Labs  Lab 12/14/18 1131 12/15/18 1323 12/16/18 0346 12/17/18 0340 12/18/18 0331  NA 132* 124* 130* 135 131*  K 4.1 3.0* 4.2 3.9 3.9  CL 97* 100 99 102 101  CO2 18* 15* 17* 20* 18*  GLUCOSE 143* 146* 121* 200* 215*  BUN 42* 47* 52* 61* 67*  CREATININE 2.90* 3.44* 3.93* 3.75* 3.61*  CALCIUM 8.7* 6.3* 7.6* 7.3* 7.3*  MG 2.0  --   --   --   --     GFR: Estimated Creatinine Clearance: 17.1 mL/min (A) (by C-G formula based on SCr of 3.61 mg/dL (H)).  Liver Function Tests: Recent Labs  Lab 12/14/18 1131  12/15/18 1323 12/16/18 0346 12/16/18 0859 12/17/18 0340  AST 114* 44* 24  --  14*  ALT 46* 31 27  --  20  ALKPHOS 246* 139* 131*  --  106  BILITOT 4.0* 4.6* 4.1* 3.7* 2.3*  PROT 7.2 5.8* 5.8*  --  5.4*  ALBUMIN 2.1* 2.0* 1.9*  --  1.7*    Recent Labs  Lab 12/15/18 1323  LIPASE 23   Coagulation Profile: Recent Labs  Lab 12/16/18 0346  INR 1.42     Recent Results (from the past 240 hour(s))  Urine culture     Status: Abnormal   Collection Time: 12/09/18  9:03 AM  Result Value Ref Range Status   Specimen Description KIDNEY RIGHT  Final   Special Requests   Final    NONE Performed at [facility], [place] [address] [place],  [address]    Culture (A)  Final    >=100,000 COLONIES/mL PROTEUS MIRABILIS 50,000 COLONIES/mL ENTEROCOCCUS FAECALIS 80,000 COLONIES/mL PSEUDOMONAS AERUGINOSA    Report Status 12/15/2018 FINAL  Final   Organism ID, Bacteria PROTEUS MIRABILIS (A)  Final   Organism ID, Bacteria ENTEROCOCCUS FAECALIS (A)  Final   Organism ID, Bacteria PSEUDOMONAS AERUGINOSA (A)  Final      Susceptibility   Enterococcus faecalis - MIC*    AMPICILLIN <=2 SENSITIVE Sensitive     VANCOMYCIN 4 SENSITIVE Sensitive     GENTAMICIN SYNERGY SENSITIVE Sensitive     * 50,000 COLONIES/mL ENTEROCOCCUS FAECALIS   Pseudomonas aeruginosa - MIC*    CEFTAZIDIME 32 RESISTANT Resistant     CIPROFLOXACIN <=0.25 SENSITIVE Sensitive     GENTAMICIN 4 SENSITIVE Sensitive     IMIPENEM 2 SENSITIVE Sensitive     PIP/TAZO <=4 SENSITIVE Sensitive     CEFEPIME INTERMEDIATE Intermediate     * 80,000 COLONIES/mL PSEUDOMONAS AERUGINOSA   Proteus mirabilis - MIC*    AMPICILLIN <=2 SENSITIVE Sensitive     CEFAZOLIN 8 SENSITIVE Sensitive     CEFEPIME <=1 SENSITIVE Sensitive     CEFTAZIDIME <=1 SENSITIVE Sensitive     CEFTRIAXONE <=1 SENSITIVE Sensitive     CIPROFLOXACIN <=0.25 SENSITIVE Sensitive     GENTAMICIN 8 INTERMEDIATE Intermediate     IMIPENEM 8 INTERMEDIATE Intermediate       TRIMETH/SULFA >=320 RESISTANT Resistant     AMPICILLIN/SULBACTAM <=2 SENSITIVE Sensitive     PIP/TAZO <=4 SENSITIVE Sensitive     * >=100,000 COLONIES/mL PROTEUS MIRABILIS  Blood Culture (routine x 2)     Status: None (Preliminary result)   Collection Time: 12/15/18  1:41 PM  Result Value Ref Range Status   Specimen Description   Final  BLOOD PORTA CATH Performed at [facility], [place] [address] [place], [place] [address]    Special Requests   Final    BOTTLES DRAWN AEROBIC AND ANAEROBIC Blood Culture adequate volume Performed at [place] [address], [place], [place] [address]    Culture   Final    NO GROWTH 3 DAYS Performed at [facility], [place] [address], [place], [place] [address]    Report Status PENDING  Incomplete  Blood Culture (routine x 2)     Status: Abnormal   Collection Time: 12/15/18  1:41 PM  Result Value Ref Range Status   Specimen Description BLOOD RIGHT ANTECUBITAL  Final   Special Requests   Final    BOTTLES DRAWN AEROBIC AND ANAEROBIC Blood Culture adequate volume Performed at [place] [address] [place],  [address]    Culture  Setup Time   Final    GRAM NEGATIVE RODS AEROBIC BOTTLE ONLY CRITICAL RESULT CALLED TO, READ BACK BY AND VERIFIED WITH: [place], [place] [phone] FCP    Culture PSEUDOMONAS AERUGINOSA (A)  Final   Report Status 12/18/2018 FINAL  Final   Organism ID, Bacteria PSEUDOMONAS AERUGINOSA  Final      Susceptibility   Pseudomonas aeruginosa - MIC*    CEFTAZIDIME 2 SENSITIVE Sensitive     CIPROFLOXACIN <=0.25 SENSITIVE Sensitive     GENTAMICIN <=1 SENSITIVE Sensitive     IMIPENEM 2 SENSITIVE Sensitive     PIP/TAZO 64 SENSITIVE Sensitive     CEFEPIME <=1 SENSITIVE Sensitive     * PSEUDOMONAS AERUGINOSA  Blood Culture ID Panel (Reflexed)     Status: Abnormal   Collection Time: 12/15/18  1:41 PM  Result Value Ref Range Status    Enterococcus species NOT DETECTED NOT DETECTED Final   Listeria monocytogenes NOT DETECTED NOT DETECTED Final   Staphylococcus species NOT DETECTED NOT DETECTED Final   Staphylococcus aureus (BCID) NOT DETECTED NOT DETECTED Final   Streptococcus species NOT DETECTED NOT DETECTED Final   Streptococcus agalactiae NOT DETECTED NOT DETECTED Final   Streptococcus pneumoniae NOT DETECTED NOT DETECTED Final   Streptococcus pyogenes NOT DETECTED NOT DETECTED Final   Acinetobacter baumannii NOT DETECTED NOT DETECTED Final   Enterobacteriaceae species NOT DETECTED NOT DETECTED Final   Enterobacter cloacae complex NOT DETECTED NOT DETECTED Final   Escherichia coli NOT DETECTED NOT DETECTED Final   Klebsiella oxytoca NOT DETECTED NOT DETECTED Final   Klebsiella pneumoniae NOT DETECTED NOT DETECTED Final   Proteus species NOT DETECTED NOT DETECTED Final   Serratia marcescens NOT DETECTED NOT DETECTED Final   Carbapenem resistance NOT DETECTED NOT DETECTED Final   Haemophilus influenzae NOT DETECTED NOT DETECTED Final   Neisseria meningitidis NOT DETECTED NOT DETECTED Final   Pseudomonas aeruginosa DETECTED (A) NOT DETECTED Final    Comment: CRITICAL RESULT CALLED TO, READ BACK BY AND VERIFIED WITH: Lyndel Safe, ERIN [phone] FC    Candida albicans NOT DETECTED NOT DETECTED Final   Candida glabrata NOT DETECTED NOT DETECTED Final   Candida krusei NOT DETECTED NOT DETECTED Final   Candida parapsilosis NOT DETECTED NOT DETECTED Final   Candida tropicalis NOT DETECTED NOT DETECTED Final  Urine culture     Status: Abnormal   Collection Time: 12/15/18  3:15 PM  Result Value Ref Range Status   Specimen Description   Final    URINE, RANDOM Performed at [facility], [place] [address] [place], [place]  [address]    Special Requests   Final    NONE Performed at [facility], [place] [address], [place], [place] [address]    Culture MULTIPLE SPECIES PRESENT,  SUGGEST RECOLLECTION (A)  Final   Report Status 12/17/2018 FINAL  Final  MRSA PCR Screening     Status: None   Collection Time: 12/16/18  1:35 PM  Result Value Ref Range Status   MRSA by PCR NEGATIVE NEGATIVE Final    Comment:        The GeneXpert MRSA Assay (FDA approved for NASAL specimens only), is one component of a comprehensive MRSA colonization surveillance program. It is not intended to diagnose MRSA infection nor to guide or monitor treatment for MRSA infections. Performed at [facility], [place] [address], [place], [place] [address]       Radiology Studies: Vas [name] Lower Extremity Venous (dvt)  Result Date: 12/17/2018  Lower Venous Study Indications: Swelling.  Limitations: Body habitus and poor ultrasound/tissue interface. Performing Technologist: Oliver Hum RVT  Examination Guidelines: A complete evaluation includes B-mode imaging, spectral Doppler, color Doppler, and power Doppler as needed of all accessible portions of each vessel. Bilateral testing is considered an integral part of a complete examination. Limited examinations for reoccurring indications may be performed as noted.  Right Venous Findings: +---------+---------------+---------+-----------+----------+-------+          CompressibilityPhasicitySpontaneityPropertiesSummary +---------+---------------+---------+-----------+----------+-------+ CFV      None           No       No                   Acute   +---------+---------------+---------+-----------+----------+-------+ SFJ      Full                                                 +---------+---------------+---------+-----------+----------+-------+ FV Prox                 No       No                           +---------+---------------+---------+-----------+----------+-------+ FV Mid                  No       No                           +---------+---------------+---------+-----------+----------+-------+ FV  Distal               No       No                           +---------+---------------+---------+-----------+----------+-------+ POP      Partial        No       No                   Acute   +---------+---------------+---------+-----------+----------+-------+ PTV      Full                                                 +---------+---------------+---------+-----------+----------+-------+  PERO     Full                                                 +---------+---------------+---------+-----------+----------+-------+ Unable to visualize the common iliac, and external iliac veins.  Left Venous Findings: +---------+---------------+---------+-----------+----------+-------+          CompressibilityPhasicitySpontaneityPropertiesSummary +---------+---------------+---------+-----------+----------+-------+ CFV      Full           No       No                           +---------+---------------+---------+-----------+----------+-------+ SFJ      Full                                                 +---------+---------------+---------+-----------+----------+-------+ FV Prox  Full                                                 +---------+---------------+---------+-----------+----------+-------+ FV Mid   Full                                                 +---------+---------------+---------+-----------+----------+-------+ FV DistalFull                                                 +---------+---------------+---------+-----------+----------+-------+ PFV      Full                                                 +---------+---------------+---------+-----------+----------+-------+ POP      Full           No       No                           +---------+---------------+---------+-----------+----------+-------+ PTV      Full                                                 +---------+---------------+---------+-----------+----------+-------+  PERO     Full                                                 +---------+---------------+---------+-----------+----------+-------+    Summary: Right: Findings consistent with acute deep vein thrombosis involving the right common femoral vein, and right popliteal vein. No cystic structure found in the popliteal fossa. Left: There is  no evidence of deep vein thrombosis in the lower extremity. However, portions of this examination were limited- see technologist comments above. No cystic structure found in the popliteal fossa.  *See table(s) above for measurements and observations. Electronically signed by Ruta Hinds MD on 12/17/2018 at 5:21:22 PM.    Final      Medications:  Scheduled: . hydrocortisone sod succinate (SOLU-CORTEF) inj  100 mg Intravenous Q6H  . levothyroxine  25 mcg Oral Q0600  . morphine  30 mg Oral Q12H   Continuous: . sodium chloride 100 mL/hr at 12/18/18 0600  . sodium chloride    . piperacillin-tazobactam (ZOSYN)  IV 2.25 g (12/18/18 0909)   OFB:PZWCHE chloride, acetaminophen **OR** acetaminophen, HYDROmorphone (DILAUDID) injection    Assessment/Plan:  Severe sepsis secondary to urinary tract infection/Pseudomonas bacteremia CT scan of the abdomen and pelvis did not show any acute findings.  No hydronephrosis.  The fluid collections noted previously had decreased in size.  Patient has not had any further episodes of diarrhea.  The only positive infectious finding was an abnormal UA.  So this is most likely UTI.  Patient's blood culture also growing Pseudomonas.  This appears to be pansensitive.  She had urine cultures done on 2/2.  Report was reviewed.  She grew Proteus, enterococcus and Pseudomonas.  Patient was placed on vancomycin and Zosyn.  Infectious disease consulted.  Defer antibiotic management to them.  Patient also receiving stress dose steroids.  Procalcitonin was significantly elevated at 63.5 and has improved to 35.8 as of this morning.  She remains  afebrile.  Blood pressure remains low.  Lactic acid level is normal.  Hypotension Patient is blood pressure is low due to multiple reasons including infection, narcotics, possible poor venous return due to DVT, adrenal insufficiency.  She appears to be asymptomatic.  She has been on IV fluids and stress dose steroids.  Blood pressure has not improved much.  Continue to monitor.    Rectal bleeding Patient was started on IV heparin yesterday for acute DVT.  She developed 2 episodes of rectal bleeding.  No external hemorrhoids noted.  Discussed with the medical oncology.  Medical oncology has had conversation with patient and her husband.  Plan is to de-escalate care.  IV heparin has been discontinued.  Acute renal failure with normal anion gap metabolic acidosis CT scan did not show any hydronephrosis.  Acute renal failure most likely multifactorial including sepsis, hypovolemia, hypotension.  Patient's creatinine remains elevated though slightly better today.  She has output from nephrostomies but this output has not picked up in the last 48 hours.  Bicarbonate level remains low.  No evidence for obstruction on the CT scan.  Since renal function has not improved with maximal treatment this portends a poor prognosis.    History of hydronephrosis and bilateral percutaneous nephrostomy CT scan showed these to be stable.  No hydronephrosis was noted.  The fluid collection seen previously had decreased.  The nephrostomies were exchanged on 2/3.  Continue to monitor for now.  See above as well.  Hyponatremia and hypokalemia Sodium level had improved to 135.  Noted to be 131 this morning.  Potassium is normal.  Pancytopenia Absolute neutrophil count has improved.  She did not require Granix.  Platelet counts are low but stable.  Most likely due to chemotherapy but could also be due to consumption as well now that she has been found to have a DVT.  Has not experienced a significant drop in hemoglobin  despite episodes of rectal  bleeding.  Continue to monitor.    Hyperbilirubinemia Reason for this is not entirely clear.  Bilirubin was fractionated and appears to be mostly direct.  CT scan did not show any abnormalities in the hepatobiliary system.  Could be related to sepsis or medications.  Continue to check periodically.  Stage IV cervical cancer Followed by medical oncology.  Receiving chemotherapy.  Last dose on January 30.  Medical oncology is following.  Allergy medicine is following as well.  Cancer associated pain Her dose of long-acting morphine was reduced due to hypotension.  Seems to be comfortable.    History of HIV Continue with antiretroviral treatment  Acute right lower extremity DVT Her right leg is larger than the left.  Apparently this has been present for a week or 2.  Doppler studies preliminarily suggest acute DVT in the right leg.  Was started on heparin but experienced rectal bleeding.  Heparin has been discontinued.    Acute diarrhea Has not had any further episodes in the hospital.  This was most likely due to chemotherapy.  Very low suspicion for C. Difficile.   Goals of care Discussed with Dr. Alvy Bimler this morning.  She has had a discussion with the patient and her husband.  Patient's prognosis is poor.  She appears to be getting worse.  Her renal function is not improving.  Life expectancy is thought to be about 1 month or less.  Palliative medicine to help with disposition.  DVT Prophylaxis: Will likely need to initiate intravenous heparin. Code Status: Changed to DNR today after discussions with medical oncology. Family Communication: Discussed with the patient.  No family at bedside Disposition Plan: Management as outlined above.  Patient will likely be transitioned to hospice.       LOS: 3 days   Virgle Arth [employer] on [email or web address]  12/18/2018, 10:25 AM

## 2018-12-18 NOTE — Progress Notes (Signed)
Palliative care progress note Reason for consult: Goals of care and symptom management  I met today with patient, her husband, brother, and sister. We discussed clinical course as well as wishes moving forward in light of her advanced cancer, bacteremia, DVT and renal impairment.  She discussed with Dr. Alvy Bimler this morning, and she reports wanting to be at home with the support of hospice once she completes antibiotic course (3 more days per ID note today).    We discussed options for hospice care including home with hospice, hospice support at long term care facility, and residential hospice.  She has a strong preference to be at home with hospice support and stated this multiple times today.  We discussed family concerns that she may not have the needed support at home (son is 70 with autism and her husband needs to continue to work).  Family to continue discussion regarding their ability to arrange for someone to be with her.    Physical Exam Awake alert NAD Generalized weakness S1 S2  Abdomen: bilat nephrostomy tubes, mild gen abd tenderness.  Clear lung sounds No edema  Plan: - She would like to meet with hospice to discuss plan for home with hospice support once she is ready for discharge.  We discussed that timing of discharge will ultimately be once medically cleared by primary attending, but I would anticipate discharge once she completes current antibiotic course.  Will place referral to care management to facilitate home hospice referral. - Reports symptoms currently well controlled.  Some concern with continued use of morphine with impaired renal function and will continue to monitor closely with plan to rotate opioids if ant concern for metabolite buildup.  Total time: 55 minutes Greater than 50%  of this time was spent counseling and coordinating care related to the above assessment and plan.  Micheline Rough, MD [facility] [phone]

## 2018-12-18 NOTE — Progress Notes (Signed)
Misty Thomas   DOB:November 23, 1965   QQ#:[id number]    Assessment & Plan:   Primary cervical cancer with metastasis - Stage IV She tolerated treatment poorly with major side effects including diarrhea and pancytopenia She has received chemotherapy a week ago.  Continue supportive care  Even though her most recent CT scan showed no significant disease progression, given her bacteremia, recurrent hospitalization, renal failure and others, I do not feel strongly to recommend further palliative chemotherapy.  Cancer associated pain This is stable. She will continue prescribed medications for pain.  Pancytopenia, acquired ([place]) Multifactorial, likely due to side effects of chemotherapy and renal failure.  She has received blood transfusion in my office last week.  Continue close monitoring  AKI (acute kidney injury) ([place]) Multifactorial, related to recent sepsis.  Continue fluid resuscitation  Right-sided DVT She is not able to tolerate anticoagulation therapy due to persistent vaginal fistula that would predispose her to excessive risk of bleeding.  I discussed with the patient and her husband the risks, benefits, side effects of anticoagulation therapy versus observation only.  I do not recommend IVC filter placement as it does not treat her current blood clot. 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  Discharge planning She is acutely ill She has poor performance status and clinical deterioration I have a long discussion with the patient and her husband I recommend palliative care/hospice referral.  Without further chemotherapy,  she will likely succumb to the disease in less than 3 months.  Without transfusion support or broad-spectrum antibiotics and anticoagulation therapy, her prognosis is likely less than a month, likely less than 2 weeks.  She accepted her poor prognosis and agree not to pursue further aggressive treatment We discussed CODE STATUS and she agree not to be resuscitated She agreed to forego anticoagulation therapy I recommend her to continue IV antibiotics for another day or 2, pending further consultation with palliative care/hospice team.  She would like to know her options whether she would qualify for residential hospice versus home hospice care.  [name], MD 12/18/2018  8:26 AM   Subjective:  I saw the patient twice this morning.  The second time is with her husband. The patient is noted to be hypotensive.  Her pain is well controlled.  She was discovered to have right-sided lower extremity DVT.  She was started on IV heparin, complicated by vaginal bleeding. Her energy level is fair.  Objective:  Vitals:   12/18/18 0600 12/18/18 0709  BP: (!) 92/58   Pulse: (!) 59   Resp: (!) 5   Temp:  (!) 97.4 F (36.3 C)  SpO2: 100%      Intake/Output Summary (Last 24 hours) at 12/18/2018 0826 Last data filed at 12/18/2018 0600 Gross per 24 hour  Intake 3659.91 ml  Output 325 ml  Net 3334.91 ml    GENERAL:alert, no distress and comfortable.  She looks pale ABDOMEN:abdomen soft, non-tender and normal bowel sounds Noted profound right lower extremity edema NEURO: alert & oriented x 3 with fluent speech, no focal motor/sensory deficits   Labs:  Lab Results  Component Value Date   WBC 12.5 (H) 12/18/2018   HGB 8.4 (L) 12/18/2018   HCT  26.9 (L) 12/18/2018   MCV 92.1 12/18/2018   PLT 81 (L) 12/18/2018   NEUTROABS 10.8 (H) 12/18/2018    Lab Results  Component Value Date   NA 131 (L) 12/18/2018   K 3.9 12/18/2018   CL 101 12/18/2018   CO2 18 (L) 12/18/2018    Studies:  Vas [name]  Lower Extremity Venous (dvt)  Result Date: 12/17/2018  Lower Venous Study Indications: Swelling.  Limitations: Body habitus and poor ultrasound/tissue interface. Performing Technologist: Oliver Hum RVT  Examination Guidelines: A complete evaluation includes B-mode imaging, spectral Doppler, color Doppler, and power Doppler as needed of all accessible portions of each vessel. Bilateral testing is considered an integral part of a complete examination. Limited examinations for reoccurring indications may be performed as noted.  Right Venous Findings: +---------+---------------+---------+-----------+----------+-------+          CompressibilityPhasicitySpontaneityPropertiesSummary +---------+---------------+---------+-----------+----------+-------+ CFV      None           No       No                   Acute   +---------+---------------+---------+-----------+----------+-------+ SFJ      Full                                                 +---------+---------------+---------+-----------+----------+-------+ FV Prox                 No       No                           +---------+---------------+---------+-----------+----------+-------+ FV Mid                  No       No                           +---------+---------------+---------+-----------+----------+-------+ FV Distal               No       No                           +---------+---------------+---------+-----------+----------+-------+ POP      Partial        No       No                   Acute   +---------+---------------+---------+-----------+----------+-------+ PTV      Full                                                 +---------+---------------+---------+-----------+----------+-------+ PERO     Full                                                 +---------+---------------+---------+-----------+----------+-------+ Unable to visualize the common iliac, and external iliac veins.  Left Venous  Findings: +---------+---------------+---------+-----------+----------+-------+          CompressibilityPhasicitySpontaneityPropertiesSummary +---------+---------------+---------+-----------+----------+-------+ CFV      Full  No       No                           +---------+---------------+---------+-----------+----------+-------+ SFJ      Full                                                 +---------+---------------+---------+-----------+----------+-------+ FV Prox  Full                                                 +---------+---------------+---------+-----------+----------+-------+ FV Mid   Full                                                 +---------+---------------+---------+-----------+----------+-------+ FV DistalFull                                                 +---------+---------------+---------+-----------+----------+-------+ PFV      Full                                                 +---------+---------------+---------+-----------+----------+-------+ POP      Full           No       No                           +---------+---------------+---------+-----------+----------+-------+ PTV      Full                                                 +---------+---------------+---------+-----------+----------+-------+ PERO     Full                                                 +---------+---------------+---------+-----------+----------+-------+    Summary: Right: Findings consistent with acute deep vein thrombosis involving the right common femoral vein, and right popliteal vein. No cystic structure found in the popliteal fossa. Left: There is no evidence of deep vein thrombosis in the lower extremity. However, portions of this examination were limited- see technologist comments above. No cystic structure found in the popliteal fossa.  *See table(s) above for measurements and observations. Electronically signed by Ruta Hinds MD on 12/17/2018 at 5:21:22 PM.    Final

## 2018-12-18 NOTE — Progress Notes (Signed)
Initial Nutrition Assessment  DOCUMENTATION CODES:   Obesity unspecified  INTERVENTION:  - Will order Ensure Enlive TID, each supplement provides 350 kcal and 20 grams of protein. - Will order daily multivitamin with minerals.  - Continue to encourage PO intakes as tolerated or desired.    NUTRITION DIAGNOSIS:   Increased nutrient needs related to chronic illness, cancer and cancer related treatments as evidenced by estimated needs.  GOAL:   Patient will meet greater than or equal to 90% of their needs  MONITOR:   PO intake, Supplement acceptance, Weight trends, Labs, Skin  REASON FOR ASSESSMENT:   Other (Comment)(Pressure Injury report)  ASSESSMENT:   53 y.o. female with a past medical history of stage IV cervical cancer receiving chemotherapy (last was on 1/30), HIV on antiretroviral treatment, hydronephrosis s/p ureteral stent, s/p bilateral percutaneous nephrostomy who presented with complaints of diarrhea that started on the morning of admission. She had 2 episodes of watery stool and felt weak and lightheaded. In the ED patient was noted to be hypotensive with a blood pressure initially in the 60s. There was concern for UTI and she was found to have ARF. Now de-escalating care and Palliative medicine to assist with disposition.  Per chart review, patient consumed 20% of breakfast this AM. RN at bedside states that patient only took bites of breakfast. Sister at bedside and was ordering lunch for patient after much encouragement from herself and staff: chicken noodle soup and grilled cheese. Patient was somewhat confused and very drowsy during RD visit and no reliable information was able to be obtained from her.   Sister asks RD if poor appetite is common for individuals who may be transitioning to hospice care. Talked with her about this. Encouraged sister to offer patient items when she is awake/alert enough but not to be forceful. Talked with RN about plan to order Ensure  and that this could be used to provide PO medications; RN reported that patient able to take PO medications whole without issue.  Per chart review, current weight is 170 lb and weight on 1/20 was 178 lb. This indicates 8 lb weight loss (4.5% body weight) in the past 3 weeks; significant for time frame. Patient at high risk for malnutrition.   Dr. Alvy Bimler saw patient this AM and talked with patient and patient's husband; recommendation for palliative care/hospice referral d/t poor prognosis.    Medications reviewed; 100 mg solu-cortef QID, 25 mcg oral synthroid/day. Labs reviewed; Na: 131 mmol/l, BUN: 67 mg/dl, creatinine" 3.61 mg/dl, Ca: 7.3 mg/dl, GFR: 16 ml/min.  IVF; NS @ 100 ml/hr.    NUTRITION - FOCUSED PHYSICAL EXAM:  Completed; no muscle and no fat wasting, moderate edema to BLE.  Diet Order:   Diet Order            Diet regular Room service appropriate? Yes; Fluid consistency: Thin  Diet effective now              EDUCATION NEEDS:   Not appropriate for education at this time  Skin:  Skin Assessment: Skin Integrity Issues: Skin Integrity Issues:: Stage II Stage II: R thigh  Last BM:  2/11  Height:   Ht Readings from Last 1 Encounters:  12/15/18 5\' 1"  (1.549 m)    Weight:   Wt Readings from Last 1 Encounters:  12/15/18 77 kg    Ideal Body Weight:  47.73 kg  BMI:  Body mass index is 32.07 kg/m.  Estimated Nutritional Needs:   Kcal:  2300-2500 kcal  Protein:  115-130 grams  Fluid:  >/= 2.3 L/day     Jarome Matin, MS, RD, LDN, [facility] Inpatient Clinical Dietitian Pager # [phone] After hours/weekend pager # [phone]

## 2018-12-18 NOTE — Progress Notes (Signed)
Pharmacy Antibiotic Note  Misty Thomas is a 53 y.o. female admitted on 12/15/2018 with sepsis from suspected intra-abdominal source.  Pharmacy has been consulted for cefepime dosing for pseudomonas bacteremia.  Plan:  Cefepime 2g IV q24h  Per ID, continue for 3 more days.  F/u stop date.  F/u palliative care consult/plans.  Height: 5\' 1"  (154.9 cm) Weight: 169 lb 12.1 oz (77 kg) IBW/kg (Calculated) : 47.8  Temp (24hrs), Avg:97.6 F (36.4 C), Min:97.4 F (36.3 C), Max:98.2 F (36.8 C)  Recent Labs  Lab 12/14/18 1131 12/15/18 1323 12/15/18 1341 12/15/18 1534 12/16/18 0346 12/16/18 0859 12/16/18 1349 12/17/18 0340 12/17/18 1858 12/18/18 0331  WBC 1.6* 2.2*  --   --  3.1*  --   --  7.5  --  12.5*  CREATININE 2.90* 3.44*  --   --  3.93*  --   --  3.75*  --  3.61*  LATICACIDVEN  --   --  1.4 1.1  --  0.8 0.4*  --   --   --   VANCORANDOM  --   --   --   --   --   --   --   --  15  --     Estimated Creatinine Clearance: 17.1 mL/min (A) (by C-G formula based on SCr of 3.61 mg/dL (H)).    No Known Allergies  Antimicrobials this admission: 2/2 PTA Augmentin x 7 days >> 2/8 2/8 Cefepime x 1 2/8 Vancomycin >> 2/11 2/8 Zosyn >> 2/11 2/11 Cefepime >>   Dose adjustments this admission: 2/10 18:00 Vanc random level = 15 (48 hrs after first dose), re-dosed.  Microbiology results: 2/8 BCx (peripheral): 1 of 2 Pseudomonas(Sens to all tested, including Cephs and Zosyn)      2/9 BCID (+) Pseudomonas, previous culture intermediate to cefepime, continue Zosyn, increase dose to q6h.  2/8 BCx (port): ngtd 2/8 UCx: mult species 2/8 C.diff: cancelled 2/9 MRSA PCR: neg   Thank you for allowing pharmacy to be a part of this patient's care.  Gretta Arab PharmD, BCPS Pager [phone] 12/18/2018 12:20 PM

## 2018-12-18 NOTE — Progress Notes (Signed)
Patient ID: Misty Thomas, female   DOB: 1966-03-27, 53 y.o.   MRN: [id number]          [facility]    Date of Admission:  12/15/2018   Day 4 piperacillin tazobactam         Ms. Knoke was admitted with an acute UTI complicated by transient Pseudomonas bacteremia.  Her isolate is pansensitive.  I will narrow her regimen to cefepime while she considers transition to hospice care.  I would not treat her more than 7 days total (3 more days).  Her acute kidney injury has not improved.  I will keep her off of [place].  She is scheduled to see my partner, Dr. Talbot Grumbling on 01/01/2019.  She and/or her husband could keep that appointment if they want to discuss ongoing treatment for HIV although I would not recommend it if she transitions to comfort care only.  I will sign off now.         Michel Bickers, MD [facility] [phone] pager   [phone] cell 12/18/2018, 12:05 PM

## 2018-12-19 ENCOUNTER — Telehealth: Payer: Self-pay

## 2018-12-19 LAB — COMPREHENSIVE METABOLIC PANEL
ALT: 17 U/L (ref 0–44)
AST: 13 U/L — ABNORMAL LOW (ref 15–41)
Albumin: 1.8 g/dL — ABNORMAL LOW (ref 3.5–5.0)
Alkaline Phosphatase: 102 U/L (ref 38–126)
Anion gap: 13 (ref 5–15)
BUN: 75 mg/dL — ABNORMAL HIGH (ref 6–20)
CALCIUM: 7.7 mg/dL — AB (ref 8.9–10.3)
CO2: 19 mmol/L — ABNORMAL LOW (ref 22–32)
Chloride: 102 mmol/L (ref 98–111)
Creatinine, Ser: 3.53 mg/dL — ABNORMAL HIGH (ref 0.44–1.00)
GFR, EST AFRICAN AMERICAN: 16 mL/min — AB (ref >60)
GFR, EST NON AFRICAN AMERICAN: 14 mL/min — AB (ref >60)
Glucose, Bld: 242 mg/dL — ABNORMAL HIGH (ref 70–99)
Potassium: 3.8 mmol/L (ref 3.5–5.1)
Sodium: 134 mmol/L — ABNORMAL LOW (ref 135–145)
Total Bilirubin: 1.4 mg/dL — ABNORMAL HIGH (ref 0.3–1.2)
Total Protein: 5.7 g/dL — ABNORMAL LOW (ref 6.5–8.1)

## 2018-12-19 LAB — CBC WITH DIFFERENTIAL/PLATELET
Abs Immature Granulocytes: 1.06 10*3/uL — ABNORMAL HIGH (ref 0.00–0.07)
Basophils Absolute: 0 10*3/uL (ref 0.0–0.1)
Basophils Relative: 0 %
Eosinophils Absolute: 0 10*3/uL (ref 0.0–0.5)
Eosinophils Relative: 0 %
HCT: 28.1 % — ABNORMAL LOW (ref 36.0–46.0)
Hemoglobin: 8.8 g/dL — ABNORMAL LOW (ref 12.0–15.0)
Immature Granulocytes: 8 %
Lymphocytes Relative: 4 %
Lymphs Abs: 0.6 10*3/uL — ABNORMAL LOW (ref 0.7–4.0)
MCH: 29.4 pg (ref 26.0–34.0)
MCHC: 31.3 g/dL (ref 30.0–36.0)
MCV: 94 fL (ref 80.0–100.0)
MONO ABS: 0.5 10*3/uL (ref 0.1–1.0)
Monocytes Relative: 3 %
Neutro Abs: 11.9 10*3/uL — ABNORMAL HIGH (ref 1.7–7.7)
Neutrophils Relative %: 85 %
Platelets: 102 10*3/uL — ABNORMAL LOW (ref 150–400)
RBC: 2.99 MIL/uL — ABNORMAL LOW (ref 3.87–5.11)
RDW: 16.8 % — ABNORMAL HIGH (ref 11.5–15.5)
WBC: 14 10*3/uL — AB (ref 4.0–10.5)
nRBC: 0 % (ref 0.0–0.2)

## 2018-12-19 LAB — HIV-1 RNA QUANT-NO REFLEX-BLD: LOG10 HIV-1 RNA: UNDETERMINED log10copy/mL

## 2018-12-19 MED ORDER — LORAZEPAM 0.5 MG PO TABS: 0.5000 mg | ORAL_TABLET | Freq: Four times a day (QID) | ORAL | 0 refills | Status: AC | PRN

## 2018-12-19 MED ORDER — MORPHINE SULFATE 30 MG PO TABS: 30.0000 mg | ORAL_TABLET | Freq: Four times a day (QID) | ORAL | 0 refills | Status: AC | PRN

## 2018-12-19 MED ORDER — MORPHINE SULFATE ER 30 MG PO TBCR: 30.0000 mg | EXTENDED_RELEASE_TABLET | Freq: Two times a day (BID) | ORAL | 0 refills | Status: DC

## 2018-12-19 NOTE — Care Management Note (Addendum)
Case Management Note  Patient Details  Name: Misty Thomas MRN: [id number] Date of Birth: [birth year]  Subjective/Objective:               Discharge planning  Action/Plan: Spoke with patient and husband no sure if they want outpt or inpatient hospice care.   tct-jennifer Woody with [facility] to come and see family. [facility]-JENNIFER WOODY WILL SEE PATIENT Expected Discharge Date:                  Expected Discharge Plan:     In-House Referral:     Discharge planning Services     Post Acute Care Choice:    Choice offered to:     DME Arranged:    DME Agency:     HH Arranged:    HH Agency:     Status of Service:     If discussed at [employer] of Stay Meetings, dates discussed:    Additional Comments:  Leeroy Cha, RN 12/19/2018, 10:21 AM

## 2018-12-19 NOTE — Progress Notes (Signed)
LCSW consulted for hospice placement.  LCSW met with patient and spouse at bedside. Patient and family interested in home with hospice. LCSW notified Audrea Muscat with [facility].   LCSW signing off.   Carolin Coy [place]

## 2018-12-19 NOTE — Care Management Note (Signed)
Case Management Note  Patient Details  Name: JAQUELYN SAKAMOTO MRN: [id number] Date of Birth: 1965/12/29  Subjective/Objective:                  Discharge Readiness Return to top of Sepsis and Other Febrile Illness, without Focal Infection RRG - [place]  Discharge readiness is indicated by patient meeting Recovery Milestones, including ALL of the following: ? Hemodynamic stability YES ? Fever absent or reduced YES ? Hypoxemia absent YES ? Mental status at baseline YES ? End-organ dysfunction (eg, myocardial ischemia, renal failure) absent YES ? Metabolic derangement (eg, dehydration, acidosis) absent YES ? Cultures negative or infection identified and under adequate treatment YES ? Ambulatory NO ? Oral hydration, medications[P] ? IV MAXIPIME, IV NS AT 100CC/HR ? HOSPICE REFERRAL DOEN SEE NOTE FROM [id number]   Action/Plan: HOME WITH [facility]PICE [facility]  Expected Discharge Date:                  Expected Discharge Plan:     In-House Referral:     Discharge planning Services     Post Acute Care Choice:    Choice offered to:     DME Arranged:    DME Agency:     HH Arranged:    HH Agency:     Status of Service:     If discussed at [employer] of [employer], dates discussed:    Additional Comments:  Leeroy Cha, RN 12/19/2018, 11:27 AM

## 2018-12-19 NOTE — Telephone Encounter (Signed)
Tina with hospice called and left a message.  Called her back. She is asking if Dr. Alvy Bimler will be the record of attending? Davian will be d/ced later today with hospice.

## 2018-12-19 NOTE — Progress Notes (Addendum)
[facility] [place] Liaison: RN visit   Notified by Velva Harman, [facility] of patient/family request for [facility] services at home after discharge. Chart and patient information under review by [facility] physician. Hospice eligibility confirmed.   Writer spoke with patient and family at bedside to initiate education related to hospice philosophy, services and team approach to care. Family and patient  verbalized understanding of information given. Per discussion, plan is for discharge to home by [facility]AR today.   Please send signed and completed DNR form home with patient/family. Patient will need prescriptions for discharge comfort medications. Please change IV antibiotics to an oral dosing.    DME needs have been discussed, patient currently has the following equipment in the home: none.  Patient/family requests the following DME for delivery to the home: hospital bed, 3N1, and transfer chair. [facility] equipment manager has been notified and will contact [facility] to arrange delivery to the home. Home address has been verified and is correct in the chart. Hoa Briggs is the family member to contact to arrange time of delivery.   [facility]erral [facility] aware of the above. Please notify [facility] when patient is ready to leave the unit at discharge. (Call [phone] or [phone] after 5pm.) [facility] information and contact numbers given to family at time of visit. Above information shared with Suanne Marker, [facility].   Please call with any hospice related questions.   Thank you for this referral.   Farrel Gordon, RN, [facility] Liaison [phone]

## 2018-12-19 NOTE — Progress Notes (Signed)
I have reviewed her chart.  I met with the patient and her husband today.  She feels well.  Denies significant pain. The patient has indicated she would like to go home but this is unrealistic.  Her husband has verbalized in front of her that he cannot take care of her at home even with palliative care service on board. Awaiting assistance from care management to help transitioning her care to residential hospice facility I think it is reasonable to continue antibiotics while she is here. We will discontinue blood transfusion support and as with my note from yesterday, we will not treat her recently diagnosed DVT.  I will sign off.  Please call if questions arise 

## 2018-12-19 NOTE — Discharge Summary (Signed)
Physician Discharge Summary  QUANDA PAVLICEK LKG:[id number] DOB: [birth year] DOA: 12/15/2018  PCP: Patient, No Pcp Per  Admit date: 12/15/2018 Discharge date: 12/19/2018  Time spent: 25 minutes  Recommendations for Outpatient Follow-up:  1. Home hospice to follow-up as an outpatient 2. DME ordered including hospital bed 3 and 1 transfer chair 3. Limited prescription 5-day supply of controlled substances given on discharge 4. Please clarify DO NOT RESUSCITATE do not hospitalize and other status as outpatient  Discharge Diagnoses:  Active Problems:   Primary cervical cancer with metastasis - Stage IV   HIV (human immunodeficiency virus infection) (HCC)   Pancytopenia, acquired ([place])   Hydronephrosis   Port-A-Cath in place   AKI (acute kidney injury) ([place])   Sepsis ([place])   Complicated UTI (urinary tract infection)   Acute diarrhea   Hyperbilirubinemia   Metabolic acidosis   Pressure injury of skin   Gram-negative bacteremia   Pseudomonas infection   Discharge Condition: Guarded  Diet recommendation: Comfort  Filed Weights   12/15/18 1336  Weight: 77 kg    History of present illness:  53 year old African-American female known HIV AIDS Cervical cancer metastatic disease stage IV Right hydronephrosis status post stent Multiple perirectal parasacral abscesses Recent discharge from hospital 11/09/2018 by myself Failed in the outpatient setting and readmitted 12/15/2018 after last chemotherapy 12/06/2018-found to be hypotensive blood pressure initially in 60s potential UTI She did not does improve despite active treatment She was found on this admission in addition to rectal bleeding also to have a DVT and was not felt to be a candidate for the same Infectious disease was consulted as blood was growing Pseudomonas which was pansensitive she also grew Proteus enterococcus and was initially on broad-spectrum antibiotics   Hospital Course:  Patient was admitted to the ICU with sepsis  renal failure and electrolyte disturbances and abnormalities Ultimately hospice palliative care saw the patient after she had been seen by oncologist and felt not to be a great candidate for further aggressive medical therapies Infectious disease saw and managed her infectious issues She was changed to DNR and further discussions were held by the primary team-I picked up service on 12/19/2018 and it looks like the patient was wishing to go home despite recommendations to the contrary by her primary oncologist I have clarified with hospice agency what the wishes of the family are and we will discharge patient home with equipment and refills on various meds As an outpatient I would expect further clarification about DNR/DNH status  Discharge Exam: Vitals:   12/19/18 1000 12/19/18 1100  BP: 111/69 111/65  Pulse: 61 64  Resp: 16 13  Temp:    SpO2: 100% 99%    General: Awake but slightly sleepy verbalizing fairly Cardiovascular: S1-S2 no murmur rub or gallop no rales no rhonchi Respiratory: Clinically clear no added sound No lower extremity edema Neurologically intact moving 4 limbs  Discharge Instructions   Discharge Instructions    DME Hospital bed   Complete by:  As directed    The above medical condition requires:  Patient requires the ability to reposition frequently   Head must be elevated greater than:  45 degrees   Bed type:  Semi-electric   Trapeze Bar:  Yes   Support Surface:  Low Air loss Mattress   Diet - low sodium heart healthy   Complete by:  As directed    Increase activity slowly   Complete by:  As directed      Allergies as of 12/19/2018  No Known Allergies     Medication List    STOP taking these medications   amoxicillin-clavulanate 875-125 MG tablet Commonly known as:  AUGMENTIN   levothyroxine 25 MCG tablet Commonly known as:  SYNTHROID, LEVOTHROID   phenazopyridine 200 MG tablet Commonly known as:  PYRIDIUM     TAKE these medications    acetaminophen 500 MG tablet Commonly known as:  TYLENOL Take 500 mg by mouth every 6 (six) hours as needed for mild pain.   BIKTARVY 50-200-25 MG Tabs tablet Generic drug:  bictegravir-emtricitabine-tenofovir AF TAKE 1 TABLET BY MOUTH DAILY.   dexamethasone 4 MG tablet Commonly known as:  DECADRON Take 4 mg by mouth as directed. Take 5 pills the night before and 5 pills the morning of chemo, every 3 weeks   furosemide 20 MG tablet Commonly known as:  LASIX Take 20 mg by mouth daily as needed for edema.   lidocaine-prilocaine cream Commonly known as:  EMLA Apply 1 application topically as needed.   loperamide 2 MG capsule Commonly known as:  IMODIUM Take 2 mg by mouth as needed for diarrhea or loose stools.   LORazepam 0.5 MG tablet Commonly known as:  ATIVAN Take 1 tablet (0.5 mg total) by mouth every 6 (six) hours as needed for up to 5 days for anxiety.   magnesium oxide 400 (241.3 Mg) MG tablet Commonly known as:  MAG-OX Take 1 tablet (400 mg total) by mouth 2 (two) times daily.   morphine 30 MG 12 hr tablet Commonly known as:  MS CONTIN Take 1 tablet (30 mg total) by mouth every 12 (twelve) hours. What changed:    medication strength  how much to take   morphine 30 MG tablet Commonly known as:  MSIR Take 1 tablet (30 mg total) by mouth every 6 (six) hours as needed for up to 5 days for severe pain. What changed:  Another medication with the same name was changed. Make sure you understand how and when to take each.   ondansetron 8 MG tablet Commonly known as:  ZOFRAN Take 1 tablet (8 mg total) by mouth every 8 (eight) hours as needed.   polyethylene glycol packet Commonly known as:  MIRALAX / GLYCOLAX Take 17 g by mouth daily.   prochlorperazine 10 MG tablet Commonly known as:  COMPAZINE Take 10 mg by mouth every 6 (six) hours as needed.            Durable Medical Equipment  (From admission, onward)         Start     Ordered   12/19/18 1227   DME tub bench  Once     12/19/18 1227   12/19/18 0000  DME Hospital bed    Question Answer Comment  The above medical condition requires: Patient requires the ability to reposition frequently   Head must be elevated greater than: 45 degrees   Bed type Semi-electric   Trapeze Bar Yes   Support Surface: Low Air loss Mattress      12/19/18 1227         No Known Allergies    The results of significant diagnostics from this hospitalization (including imaging, microbiology, ancillary and laboratory) are listed below for reference.    Significant Diagnostic Studies: Ct Abdomen Pelvis Wo Contrast  Result Date: 12/15/2018 CLINICAL DATA:  Abdominal pain, fever. EXAM: CT ABDOMEN AND PELVIS WITHOUT CONTRAST TECHNIQUE: Multidetector CT imaging of the abdomen and pelvis was performed following the standard protocol without IV contrast. COMPARISON:  11/01/2018 FINDINGS: Lower chest: Nodule in the peripheral right middle lobe measures 7 mm on image 8. This is new since PET CT from 10/09/2018. No effusions. Heart is normal size. Hepatobiliary: No focal hepatic abnormality. Gallbladder unremarkable. Pancreas: No focal abnormality or ductal dilatation. Spleen: No focal abnormality.  Normal size. Adrenals/Urinary Tract: Bilateral nephrostomy catheters in place. Right ureteral stent in place. Previously seen left perinephric fluid has decreased. No hydronephrosis currently. Adrenal glands and urinary bladder unremarkable. Stomach/Bowel: Stomach, large and small bowel grossly unremarkable. Vascular/Lymphatic: No evidence of aneurysm or adenopathy. Reproductive: Difficult to assess lower pelvic structures without intravenous and oral contrast. Other: No visible free fluid or free air. Musculoskeletal: No acute bony abnormality. IMPRESSION: Bilateral nephrostomy catheters in place with no hydronephrosis. Decreasing left perinephric fluid since prior study. Right ureteral stent in place. New 7 mm nodule in the  peripheral right lung along the minor fissure. Cannot exclude metastasis. No definite acute process. Electronically Signed   By: Rolm Baptise M.D.   On: 12/15/2018 19:15   Dg Chest Port 1 View  Result Date: 12/15/2018 CLINICAL DATA:  Patient with diarrhea.  Back pain. EXAM: PORTABLE CHEST 1 VIEW COMPARISON:  Chest radiograph 01/25/2010 FINDINGS: Monitoring leads overlie the patient. Right anterior chest wall Port-A-Cath is present with tip projecting over the superior vena cava. Cardiomegaly. Bilateral interstitial pulmonary opacities. Low lung volumes. No large area pulmonary consolidation. No pleural effusion or pneumothorax. IMPRESSION: Bilateral interstitial opacities may represent mild pulmonary edema. Electronically Signed   By: Lovey Newcomer M.D.   On: 12/15/2018 14:34   Ir Nephrostomy Exchange Left  Result Date: 12/10/2018 INDICATION: 53 year old female with a history of bilateral ureteral obstruction. Bilateral PCN performed 11/04/2018. There is a report that the patient has nonfunctional PCN and she has been referred for routine exchange. EXAM: IR EXCHANGE NEPHROSTOMY LEFT; IR EXCHANGE NEPHROSTOMY RIGHT COMPARISON:  11/04/2018, 11/01/2018 MEDICATIONS: None ANESTHESIA/SEDATION: None CONTRAST:  5 cc-administered into the collecting system(s) FLUOROSCOPY TIME:  Fluoroscopy Time: 1 minutes 6 seconds (17.8 mGy). COMPLICATIONS: None PROCEDURE: Informed written consent was obtained from the patient after a thorough discussion of the procedural risks, benefits and alternatives. All questions were addressed. Maximal Sterile Barrier Technique was utilized including caps, mask, sterile gowns, sterile gloves, sterile drape, hand hygiene and skin antiseptic. A timeout was performed prior to the initiation of the procedure. Patient position prone position on the fluoroscopy table. The bilateral flank and indwelling catheters were prepped and draped in the usual sterile fashion. Scout image was acquired. Started on  the left. Contrast was gently infused confirming location within the collecting system. Catheter was ligated, 1% lidocaine was used for local anesthesia, and modified Seldinger technique was used to exchange the drain for a new 10 [place] drain. Catheter was sutured in position and attached to gravity drainage. We then turned to the right. Contrast injected confirmed location within the collecting system. 1% lidocaine was used for local anesthesia. Catheter was ligated and modified Seldinger technique was used to exchange the drain. Drain was sutured in position attached to gravity drainage. Patient tolerated the procedure well and remained hemodynamically stable throughout. No complications were encountered and no significant blood loss. IMPRESSION: Status post image guided exchange of bilateral PCN. Signed, Dulcy Fanny. Dellia Nims, RPVI [facility] Electronically Signed   By: Corrie Mckusick D.O.   On: 12/10/2018 11:59   Ir Nephrostomy Exchange Right  Result Date: 12/10/2018 INDICATION: 53 year old female with a history of bilateral ureteral obstruction. Bilateral  PCN performed 11/04/2018. There is a report that the patient has nonfunctional PCN and she has been referred for routine exchange. EXAM: IR EXCHANGE NEPHROSTOMY LEFT; IR EXCHANGE NEPHROSTOMY RIGHT COMPARISON:  11/04/2018, 11/01/2018 MEDICATIONS: None ANESTHESIA/SEDATION: None CONTRAST:  5 cc-administered into the collecting system(s) FLUOROSCOPY TIME:  Fluoroscopy Time: 1 minutes 6 seconds (17.8 mGy). COMPLICATIONS: None PROCEDURE: Informed written consent was obtained from the patient after a thorough discussion of the procedural risks, benefits and alternatives. All questions were addressed. Maximal Sterile Barrier Technique was utilized including caps, mask, sterile gowns, sterile gloves, sterile drape, hand hygiene and skin antiseptic. A timeout was performed prior to the initiation of the  procedure. Patient position prone position on the fluoroscopy table. The bilateral flank and indwelling catheters were prepped and draped in the usual sterile fashion. Scout image was acquired. Started on the left. Contrast was gently infused confirming location within the collecting system. Catheter was ligated, 1% lidocaine was used for local anesthesia, and modified Seldinger technique was used to exchange the drain for a new 10 [place] drain. Catheter was sutured in position and attached to gravity drainage. We then turned to the right. Contrast injected confirmed location within the collecting system. 1% lidocaine was used for local anesthesia. Catheter was ligated and modified Seldinger technique was used to exchange the drain. Drain was sutured in position attached to gravity drainage. Patient tolerated the procedure well and remained hemodynamically stable throughout. No complications were encountered and no significant blood loss. IMPRESSION: Status post image guided exchange of bilateral PCN. Signed, Dulcy Fanny. Dellia Nims, RPVI [facility] Rad[facility] Special[facility] Radiology Electronically Signed   By: Corrie Mckusick D.O.   On: 12/10/2018 11:59   Vas [name] Lower Extremity Venous (dvt)  Result Date: 12/17/2018  Lower Venous Study Indications: Swelling.  Limitations: Body habitus and poor ultrasound/tissue interface. Performing Technologist: Oliver Hum RVT  Examination Guidelines: A complete evaluation includes B-mode imaging, spectral Doppler, color Doppler, and power Doppler as needed of all accessible portions of each vessel. Bilateral testing is considered an integral part of a complete examination. Limited examinations for reoccurring indications may be performed as noted.  Right Venous Findings: +---------+---------------+---------+-----------+----------+-------+          CompressibilityPhasicitySpontaneityPropertiesSummary  +---------+---------------+---------+-----------+----------+-------+ CFV      None           No       No                   Acute   +---------+---------------+---------+-----------+----------+-------+ SFJ      Full                                                 +---------+---------------+---------+-----------+----------+-------+ FV Prox                 No       No                           +---------+---------------+---------+-----------+----------+-------+ FV Mid                  No       No                           +---------+---------------+---------+-----------+----------+-------+ FV Distal  No       No                           +---------+---------------+---------+-----------+----------+-------+ POP      Partial        No       No                   Acute   +---------+---------------+---------+-----------+----------+-------+ PTV      Full                                                 +---------+---------------+---------+-----------+----------+-------+ PERO     Full                                                 +---------+---------------+---------+-----------+----------+-------+ Unable to visualize the common iliac, and external iliac veins.  Left Venous Findings: +---------+---------------+---------+-----------+----------+-------+          CompressibilityPhasicitySpontaneityPropertiesSummary +---------+---------------+---------+-----------+----------+-------+ CFV      Full           No       No                           +---------+---------------+---------+-----------+----------+-------+ SFJ      Full                                                 +---------+---------------+---------+-----------+----------+-------+ FV Prox  Full                                                 +---------+---------------+---------+-----------+----------+-------+ FV Mid   Full                                                  +---------+---------------+---------+-----------+----------+-------+ FV DistalFull                                                 +---------+---------------+---------+-----------+----------+-------+ PFV      Full                                                 +---------+---------------+---------+-----------+----------+-------+ POP      Full           No       No                           +---------+---------------+---------+-----------+----------+-------+ PTV      Full                                                 +---------+---------------+---------+-----------+----------+-------+  PERO     Full                                                 +---------+---------------+---------+-----------+----------+-------+    Summary: Right: Findings consistent with acute deep vein thrombosis involving the right common femoral vein, and right popliteal vein. No cystic structure found in the popliteal fossa. Left: There is no evidence of deep vein thrombosis in the lower extremity. However, portions of this examination were limited- see technologist comments above. No cystic structure found in the popliteal fossa.  *See table(s) above for measurements and observations. Electronically signed by Ruta Hinds MD on 12/17/2018 at 5:21:22 PM.    Final    Vas [name] Lower Extremity Venous (dvt)  Result Date: 11/25/2018  Lower Venous Study Indications: Edema.  Performing Technologist: June Leap RDMS, RVT  Examination Guidelines: A complete evaluation includes B-mode imaging, spectral Doppler, color Doppler, and power Doppler as needed of all accessible portions of each vessel. Bilateral testing is considered an integral part of a complete examination. Limited examinations for reoccurring indications may be performed as noted.  Right Venous Findings: +---------+---------------+---------+-----------+----------+-------+          CompressibilityPhasicitySpontaneityPropertiesSummary  +---------+---------------+---------+-----------+----------+-------+ CFV      Full           Yes      Yes                          +---------+---------------+---------+-----------+----------+-------+ SFJ      Full                                                 +---------+---------------+---------+-----------+----------+-------+ FV Prox  Full                                                 +---------+---------------+---------+-----------+----------+-------+ FV Mid   Full                                                 +---------+---------------+---------+-----------+----------+-------+ FV DistalFull                                                 +---------+---------------+---------+-----------+----------+-------+ PFV      Full                                                 +---------+---------------+---------+-----------+----------+-------+ POP      Full           Yes      Yes                          +---------+---------------+---------+-----------+----------+-------+ PTV  Full                                                 +---------+---------------+---------+-----------+----------+-------+ PERO     Full                                                 +---------+---------------+---------+-----------+----------+-------+  Left Venous Findings: +---+---------------+---------+-----------+----------+-------+    CompressibilityPhasicitySpontaneityPropertiesSummary +---+---------------+---------+-----------+----------+-------+ CFVFull           Yes      Yes                          +---+---------------+---------+-----------+----------+-------+    Summary: Right: There is no evidence of deep vein thrombosis in the lower extremity. No cystic structure found in the popliteal fossa. Left: No evidence of common femoral vein obstruction.  *See table(s) above for measurements and observations. Electronically signed by Harold Barban MD on  11/25/2018 at 4:58:57 PM.    Final     Microbiology: Recent Results (from the past 240 hour(s))  Blood Culture (routine x 2)     Status: None (Preliminary result)   Collection Time: 12/15/18  1:41 PM  Result Value Ref Range Status   Specimen Description   Final    BLOOD PORTA CATH Performed at [facility], [place] [address], [place], [place] [address]    Special Requests   Final    BOTTLES DRAWN AEROBIC AND ANAEROBIC Blood Culture adequate volume Performed at [place] [address], [place], [place] [address]    Culture  Setup Time   Final    AEROBIC BOTTLE ONLY GRAM NEGATIVE RODS CRITICAL VALUE NOTED.  VALUE IS CONSISTENT WITH PREVIOUSLY REPORTED AND CALLED VALUE. Performed at [facility], [place] [address] [place], [place] [address]    Culture GRAM NEGATIVE RODS  Final   Report Status PENDING  Incomplete  Blood Culture (routine x 2)     Status: Abnormal   Collection Time: 12/15/18  1:41 PM  Result Value Ref Range Status   Specimen Description BLOOD RIGHT ANTECUBITAL  Final   Special Requests   Final    BOTTLES DRAWN AEROBIC AND ANAEROBIC Blood Culture adequate volume Performed at [facility] [address], [place], [place] [address]    Culture  Setup Time   Final    GRAM NEGATIVE RODS AEROBIC BOTTLE ONLY CRITICAL RESULT CALLED TO, READ BACK BY AND VERIFIED WITH: [place], [place] [phone] FCP    Culture PSEUDOMONAS AERUGINOSA (A)  Final   Report Status 12/18/2018 FINAL  Final   Organism ID, Bacteria PSEUDOMONAS AERUGINOSA  Final      Susceptibility   Pseudomonas aeruginosa - MIC*    CEFTAZIDIME 2 SENSITIVE Sensitive     CIPROFLOXACIN <=0.25 SENSITIVE Sensitive     GENTAMICIN <=1 SENSITIVE Sensitive     IMIPENEM 2 SENSITIVE Sensitive     PIP/TAZO 64 SENSITIVE Sensitive     CEFEPIME <=1 SENSITIVE Sensitive     * PSEUDOMONAS AERUGINOSA  Blood Culture ID Panel (Reflexed)     Status: Abnormal    Collection Time: 12/15/18  1:41 PM  Result Value Ref Range Status   Enterococcus  species NOT DETECTED NOT DETECTED Final   Listeria monocytogenes NOT DETECTED NOT DETECTED Final   Staphylococcus species NOT DETECTED NOT DETECTED Final   Staphylococcus aureus (BCID) NOT DETECTED NOT DETECTED Final   Streptococcus species NOT DETECTED NOT DETECTED Final   Streptococcus agalactiae NOT DETECTED NOT DETECTED Final   Streptococcus pneumoniae NOT DETECTED NOT DETECTED Final   Streptococcus pyogenes NOT DETECTED NOT DETECTED Final   Acinetobacter baumannii NOT DETECTED NOT DETECTED Final   Enterobacteriaceae species NOT DETECTED NOT DETECTED Final   Enterobacter cloacae complex NOT DETECTED NOT DETECTED Final   Escherichia coli NOT DETECTED NOT DETECTED Final   Klebsiella oxytoca NOT DETECTED NOT DETECTED Final   Klebsiella pneumoniae NOT DETECTED NOT DETECTED Final   Proteus species NOT DETECTED NOT DETECTED Final   Serratia marcescens NOT DETECTED NOT DETECTED Final   Carbapenem resistance NOT DETECTED NOT DETECTED Final   Haemophilus influenzae NOT DETECTED NOT DETECTED Final   Neisseria meningitidis NOT DETECTED NOT DETECTED Final   Pseudomonas aeruginosa DETECTED (A) NOT DETECTED Final    Comment: CRITICAL RESULT CALLED TO, READ BACK BY AND VERIFIED WITH: Lyndel Safe, ERIN [id number] FC    Candida albicans NOT DETECTED NOT DETECTED Final   Candida glabrata NOT DETECTED NOT DETECTED Final   Candida krusei NOT DETECTED NOT DETECTED Final   Candida parapsilosis NOT DETECTED NOT DETECTED Final   Candida tropicalis NOT DETECTED NOT DETECTED Final  Urine culture     Status: Abnormal   Collection Time: 12/15/18  3:15 PM  Result Value Ref Range Status   Specimen Description   Final    URINE, RANDOM Performed at [facility], [place] [address], [place], [place] [address]    Special Requests   Final    NONE Performed at [facility], [place]  [address], [place], [place] [address]    Culture MULTIPLE SPECIES PRESENT, SUGGEST RECOLLECTION (A)  Final   Report Status 12/17/2018 FINAL  Final  MRSA PCR Screening     Status: None   Collection Time: 12/16/18  1:35 PM  Result Value Ref Range Status   MRSA by PCR NEGATIVE NEGATIVE Final    Comment:        The GeneXpert MRSA Assay (FDA approved for NASAL specimens only), is one component of a comprehensive MRSA colonization surveillance program. It is not intended to diagnose MRSA infection nor to guide or monitor treatment for MRSA infections. Performed at [facility], [place] [address] [place], [place] [address]      Labs: Basic Metabolic Panel: Recent Labs  Lab 12/14/18 1131 12/15/18 1323 12/16/18 0346 12/17/18 0340 12/18/18 0331 12/19/18 0500  NA 132* 124* 130* 135 131* 134*  K 4.1 3.0* 4.2 3.9 3.9 3.8  CL 97* 100 99 102 101 102  CO2 18* 15* 17* 20* 18* 19*  GLUCOSE 143* 146* 121* 200* 215* 242*  BUN 42* 47* 52* 61* 67* 75*  CREATININE 2.90* 3.44* 3.93* 3.75* 3.61* 3.53*  CALCIUM 8.7* 6.3* 7.6* 7.3* 7.3* 7.7*  MG 2.0  --   --   --   --   --    Liver Function Tests: Recent Labs  Lab 12/14/18 1131 12/15/18 1323 12/16/18 0346 12/16/18 0859 12/17/18 0340 12/19/18 0500  AST 114* 44* 24  --  14* 13*  ALT 46* 31 27  --  20 17  ALKPHOS 246* 139* 131*  --  106 102  BILITOT 4.0* 4.6* 4.1* 3.7* 2.3* 1.4*  PROT  7.2 5.8* 5.8*  --  5.4* 5.7*  ALBUMIN 2.1* 2.0* 1.9*  --  1.7* 1.8*   Recent Labs  Lab 12/15/18 1323  LIPASE 23   No results for input(s): AMMONIA in the last 168 hours. CBC: Recent Labs  Lab 12/15/18 1323 12/15/18 1800 12/16/18 0346 12/17/18 0340 12/17/18 1858 12/18/18 0331 12/19/18 0500  WBC 2.2*  --  3.1* 7.5  --  12.5* 14.0*  NEUTROABS  --  1.6* 2.7 6.7  --  10.8* 11.9*  HGB 8.0*  --  9.1* 8.6* 8.8* 8.4* 8.8*  HCT 25.3*  --  28.9* 27.0* 27.5* 26.9* 28.1*  MCV 91.7  --  90.3 89.1  --  92.1 94.0  PLT 93*  --  79* 78*  --   81* 102*   Cardiac Enzymes: No results for input(s): CKTOTAL, CKMB, CKMBINDEX, TROPONINI in the last 168 hours. BNP: BNP (last 3 results) No results for input(s): BNP in the last 8760 hours.  ProBNP (last 3 results) No results for input(s): PROBNP in the last 8760 hours.  CBG: No results for input(s): GLUCAP in the last 168 hours.     Signed:  Nita Sells MD   [facility] 12/19/2018, 12:27 PM

## 2018-12-20 LAB — CULTURE, BLOOD (ROUTINE X 2): Special Requests: ADEQUATE

## 2018-12-20 NOTE — Telephone Encounter (Signed)
Called and given below message. Hospice verbalized understanding.

## 2018-12-20 NOTE — Telephone Encounter (Signed)
OK but their attending need to do symptom management

## 2018-12-24 ENCOUNTER — Other Ambulatory Visit: Payer: BLUE CROSS/BLUE SHIELD

## 2018-12-24 ENCOUNTER — Telehealth: Payer: Self-pay | Admitting: *Deleted

## 2018-12-24 NOTE — Telephone Encounter (Signed)
Telephone call from [place] of call was barley audible. Caller states patient finished her antibiotic and reports pain is about the same. She has small clots in her urostomy bags. Caller was wondering if we had any new orders.  Telephone call to patient. She was sleeping comfortably. Patient offers no new concerns or questions. She denies any blood or increased pain. "I was sleeping and would like to go back to sleep now."   Advised patient to call this office if she has any new concerns or questions.

## 2018-12-28 ENCOUNTER — Ambulatory Visit (HOSPITAL_COMMUNITY): Payer: BLUE CROSS/BLUE SHIELD

## 2018-12-28 ENCOUNTER — Telehealth: Payer: Self-pay

## 2018-12-28 ENCOUNTER — Other Ambulatory Visit: Payer: Self-pay | Admitting: Hematology and Oncology

## 2018-12-28 MED ORDER — MORPHINE SULFATE 30 MG PO TABS: 30.0000 mg | ORAL_TABLET | Freq: Four times a day (QID) | ORAL | 0 refills | Status: DC | PRN

## 2018-12-28 MED FILL — BIKTARVY 50-200-25 MG TABS: 50-200-25 | 30 days supply | Qty: 30 | Fill #3

## 2018-12-28 MED FILL — MORPHINE SULFATE IR 30 MG T: 30 | 15 days supply | Qty: 60 | Fill #0

## 2018-12-28 NOTE — Telephone Encounter (Signed)
Called and given below message. She verbalized understanding. 

## 2018-12-28 NOTE — Telephone Encounter (Signed)
She called requesting refill on Morphine 30 mg take 1 every 6 hours prn. She ask that it be sent to [facility].

## 2018-12-28 NOTE — Telephone Encounter (Signed)
done

## 2018-12-31 ENCOUNTER — Other Ambulatory Visit: Payer: BLUE CROSS/BLUE SHIELD

## 2019-01-01 ENCOUNTER — Ambulatory Visit: Payer: BLUE CROSS/BLUE SHIELD

## 2019-01-01 ENCOUNTER — Ambulatory Visit: Payer: BLUE CROSS/BLUE SHIELD | Admitting: Hematology and Oncology

## 2019-01-01 ENCOUNTER — Encounter: Payer: BLUE CROSS/BLUE SHIELD | Admitting: Internal Medicine

## 2019-01-07 ENCOUNTER — Inpatient Hospital Stay (HOSPITAL_COMMUNITY)
Admission: EM | Admit: 2019-01-07 | Discharge: 2019-01-11 | DRG: 699 | Disposition: A | Discharge status: Home / Self Care | Payer: Medicaid Other | Attending: Internal Medicine | Admitting: Internal Medicine

## 2019-01-07 ENCOUNTER — Other Ambulatory Visit: Payer: Self-pay

## 2019-01-07 ENCOUNTER — Encounter (HOSPITAL_COMMUNITY): Payer: Self-pay

## 2019-01-07 ENCOUNTER — Emergency Department (HOSPITAL_COMMUNITY): Payer: Medicaid Other

## 2019-01-07 DIAGNOSIS — C7989 Secondary malignant neoplasm of other specified sites: Secondary | ICD-10-CM | POA: Diagnosis present

## 2019-01-07 DIAGNOSIS — E039 Hypothyroidism, unspecified: Secondary | ICD-10-CM | POA: Diagnosis present

## 2019-01-07 DIAGNOSIS — D649 Anemia, unspecified: Secondary | ICD-10-CM

## 2019-01-07 DIAGNOSIS — E119 Type 2 diabetes mellitus without complications: Secondary | ICD-10-CM

## 2019-01-07 DIAGNOSIS — N1330 Unspecified hydronephrosis: Secondary | ICD-10-CM | POA: Diagnosis present

## 2019-01-07 DIAGNOSIS — Z9851 Tubal ligation status: Secondary | ICD-10-CM

## 2019-01-07 DIAGNOSIS — Z7401 Bed confinement status: Secondary | ICD-10-CM

## 2019-01-07 DIAGNOSIS — Z87440 Personal history of urinary (tract) infections: Secondary | ICD-10-CM

## 2019-01-07 DIAGNOSIS — C539 Malignant neoplasm of cervix uteri, unspecified: Secondary | ICD-10-CM | POA: Diagnosis present

## 2019-01-07 DIAGNOSIS — B20 Human immunodeficiency virus [HIV] disease: Secondary | ICD-10-CM | POA: Diagnosis present

## 2019-01-07 DIAGNOSIS — Z79899 Other long term (current) drug therapy: Secondary | ICD-10-CM

## 2019-01-07 DIAGNOSIS — D500 Iron deficiency anemia secondary to blood loss (chronic): Secondary | ICD-10-CM | POA: Diagnosis present

## 2019-01-07 DIAGNOSIS — I82409 Acute embolism and thrombosis of unspecified deep veins of unspecified lower extremity: Secondary | ICD-10-CM | POA: Diagnosis present

## 2019-01-07 DIAGNOSIS — R58 Hemorrhage, not elsewhere classified: Secondary | ICD-10-CM

## 2019-01-07 DIAGNOSIS — G893 Neoplasm related pain (acute) (chronic): Secondary | ICD-10-CM | POA: Diagnosis present

## 2019-01-07 DIAGNOSIS — N183 Chronic kidney disease, stage 3 (moderate): Secondary | ICD-10-CM | POA: Diagnosis present

## 2019-01-07 DIAGNOSIS — Z7189 Other specified counseling: Secondary | ICD-10-CM

## 2019-01-07 DIAGNOSIS — L89152 Pressure ulcer of sacral region, stage 2: Secondary | ICD-10-CM | POA: Diagnosis present

## 2019-01-07 DIAGNOSIS — Z21 Asymptomatic human immunodeficiency virus [HIV] infection status: Secondary | ICD-10-CM | POA: Diagnosis present

## 2019-01-07 DIAGNOSIS — T8383XA Hemorrhage of genitourinary prosthetic devices, implants and grafts, initial encounter: Secondary | ICD-10-CM | POA: Diagnosis present

## 2019-01-07 DIAGNOSIS — Y732 Prosthetic and other implants, materials and accessory gastroenterology and urology devices associated with adverse incidents: Secondary | ICD-10-CM | POA: Diagnosis present

## 2019-01-07 DIAGNOSIS — D62 Acute posthemorrhagic anemia: Secondary | ICD-10-CM | POA: Diagnosis present

## 2019-01-07 DIAGNOSIS — N179 Acute kidney failure, unspecified: Secondary | ICD-10-CM | POA: Diagnosis present

## 2019-01-07 DIAGNOSIS — E1122 Type 2 diabetes mellitus with diabetic chronic kidney disease: Secondary | ICD-10-CM | POA: Diagnosis present

## 2019-01-07 DIAGNOSIS — Z515 Encounter for palliative care: Secondary | ICD-10-CM | POA: Diagnosis present

## 2019-01-07 DIAGNOSIS — T83022A Displacement of nephrostomy catheter, initial encounter: Principal | ICD-10-CM | POA: Diagnosis present

## 2019-01-07 DIAGNOSIS — Y658 Other specified misadventures during surgical and medical care: Secondary | ICD-10-CM | POA: Diagnosis present

## 2019-01-07 LAB — CBC WITH DIFFERENTIAL/PLATELET
Abs Immature Granulocytes: 0.15 10*3/uL — ABNORMAL HIGH (ref 0.00–0.07)
BASOS PCT: 0 %
Basophils Absolute: 0 10*3/uL (ref 0.0–0.1)
Eosinophils Absolute: 0.1 10*3/uL (ref 0.0–0.5)
Eosinophils Relative: 1 %
HEMATOCRIT: 18.1 % — AB (ref 36.0–46.0)
HEMOGLOBIN: 5.3 g/dL — AB (ref 12.0–15.0)
Immature Granulocytes: 2 %
Lymphocytes Relative: 22 %
Lymphs Abs: 2.1 10*3/uL (ref 0.7–4.0)
MCH: 29.3 pg (ref 26.0–34.0)
MCHC: 29.3 g/dL — ABNORMAL LOW (ref 30.0–36.0)
MCV: 100 fL (ref 80.0–100.0)
MONOS PCT: 8 %
Monocytes Absolute: 0.7 10*3/uL (ref 0.1–1.0)
Neutro Abs: 6.5 10*3/uL (ref 1.7–7.7)
Neutrophils Relative %: 67 %
Platelets: 301 10*3/uL (ref 150–400)
RBC: 1.81 MIL/uL — AB (ref 3.87–5.11)
RDW: 17.2 % — AB (ref 11.5–15.5)
WBC: 9.5 10*3/uL (ref 4.0–10.5)
nRBC: 0.2 % (ref 0.0–0.2)

## 2019-01-07 LAB — COMPREHENSIVE METABOLIC PANEL
ALT: 8 U/L (ref 0–44)
AST: 10 U/L — ABNORMAL LOW (ref 15–41)
Albumin: 2.3 g/dL — ABNORMAL LOW (ref 3.5–5.0)
Alkaline Phosphatase: 123 U/L (ref 38–126)
Anion gap: 3 — ABNORMAL LOW (ref 5–15)
BUN: 12 mg/dL (ref 6–20)
CO2: 25 mmol/L (ref 22–32)
Calcium: 7.8 mg/dL — ABNORMAL LOW (ref 8.9–10.3)
Chloride: 107 mmol/L (ref 98–111)
Creatinine, Ser: 1.3 mg/dL — ABNORMAL HIGH (ref 0.44–1.00)
GFR calc Af Amer: 55 mL/min — ABNORMAL LOW (ref >60)
GFR calc non Af Amer: 47 mL/min — ABNORMAL LOW (ref >60)
GLUCOSE: 86 mg/dL (ref 70–99)
Potassium: 4.1 mmol/L (ref 3.5–5.1)
Sodium: 135 mmol/L (ref 135–145)
Total Bilirubin: 1.1 mg/dL (ref 0.3–1.2)
Total Protein: 6.6 g/dL (ref 6.5–8.1)

## 2019-01-07 LAB — PREPARE RBC (CROSSMATCH)

## 2019-01-07 LAB — POC OCCULT BLOOD, ED: Fecal Occult Bld: POSITIVE — AB

## 2019-01-07 LAB — PROTIME-INR
INR: 1.1 (ref 0.8–1.2)
Prothrombin Time: 14.1 seconds (ref 11.4–15.2)

## 2019-01-07 MED ORDER — BICTEGRAVIR-EMTRICITAB-TENOFOV 50-200-25 MG PO TABS
1.0000 | ORAL_TABLET | Freq: Every day | ORAL | Status: DC
  Administered 2019-01-07 – 2019-01-11 (×5): 1 via ORAL
  Filled 2019-01-07 (×5): qty 1

## 2019-01-07 MED ORDER — DIPHENHYDRAMINE HCL 25 MG PO CAPS
25.0000 mg | ORAL_CAPSULE | Freq: Four times a day (QID) | ORAL | Status: DC | PRN
  Administered 2019-01-07 – 2019-01-09 (×3): 25 mg via ORAL
  Filled 2019-01-07 (×4): qty 1

## 2019-01-07 MED ORDER — MORPHINE SULFATE 30 MG PO TABS
30.0000 mg | ORAL_TABLET | ORAL | Status: DC | PRN
  Administered 2019-01-07: 30 mg via ORAL
  Filled 2019-01-07: qty 1

## 2019-01-07 MED ORDER — HEPARIN SOD (PORK) LOCK FLUSH 100 UNIT/ML IV SOLN
500.0000 [IU] | Freq: Every day | INTRAVENOUS | Status: DC | PRN
  Filled 2019-01-07: qty 5

## 2019-01-07 MED ORDER — HYDROMORPHONE HCL 1 MG/ML IJ SOLN
0.5000 mg | INTRAMUSCULAR | Status: DC | PRN
  Administered 2019-01-07 (×2): 0.5 mg via INTRAVENOUS
  Filled 2019-01-07 (×2): qty 1

## 2019-01-07 MED ORDER — MORPHINE SULFATE ER 30 MG PO TBCR: 30.0000 mg | EXTENDED_RELEASE_TABLET | Freq: Two times a day (BID) | ORAL | Status: DC

## 2019-01-07 MED ORDER — ACETAMINOPHEN 650 MG RE SUPP: 650.0000 mg | Freq: Four times a day (QID) | RECTAL | Status: DC | PRN

## 2019-01-07 MED ORDER — SODIUM CHLORIDE 0.9% FLUSH: 3.0000 mL | INTRAVENOUS | Status: DC | PRN

## 2019-01-07 MED ORDER — ACETAMINOPHEN 325 MG PO TABS
650.0000 mg | ORAL_TABLET | Freq: Four times a day (QID) | ORAL | Status: DC | PRN
  Administered 2019-01-07 (×2): 650 mg via ORAL
  Filled 2019-01-07 (×2): qty 2

## 2019-01-07 MED ORDER — SODIUM CHLORIDE 0.9 % IV SOLN: 10.0000 mL/h | Freq: Once | INTRAVENOUS | Status: DC

## 2019-01-07 MED ORDER — HYDROMORPHONE HCL 1 MG/ML IJ SOLN
1.0000 mg | Freq: Once | INTRAMUSCULAR | Status: AC
  Administered 2019-01-07: 1 mg via INTRAVENOUS
  Filled 2019-01-07: qty 1

## 2019-01-07 MED ORDER — CLONAZEPAM 0.5 MG PO TABS
0.2500 mg | ORAL_TABLET | Freq: Two times a day (BID) | ORAL | Status: DC
  Administered 2019-01-07 – 2019-01-11 (×9): 0.25 mg via ORAL
  Filled 2019-01-07 (×9): qty 1

## 2019-01-07 MED ORDER — ONDANSETRON HCL 4 MG PO TABS: 4.0000 mg | ORAL_TABLET | Freq: Four times a day (QID) | ORAL | Status: DC | PRN

## 2019-01-07 MED ORDER — SODIUM CHLORIDE 0.9 % IV SOLN
Freq: Once | INTRAVENOUS | Status: AC
  Administered 2019-01-07: 04:00:00 via INTRAVENOUS

## 2019-01-07 MED ORDER — ONDANSETRON HCL 4 MG/2ML IJ SOLN
4.0000 mg | Freq: Once | INTRAMUSCULAR | Status: AC
  Administered 2019-01-07: 4 mg via INTRAVENOUS
  Filled 2019-01-07: qty 2

## 2019-01-07 MED ORDER — METHADONE HCL 5 MG PO TABS
5.0000 mg | ORAL_TABLET | Freq: Three times a day (TID) | ORAL | Status: DC
  Administered 2019-01-07 – 2019-01-11 (×13): 5 mg via ORAL
  Filled 2019-01-07 (×13): qty 1

## 2019-01-07 MED ORDER — ONDANSETRON HCL 4 MG/2ML IJ SOLN
4.0000 mg | Freq: Four times a day (QID) | INTRAMUSCULAR | Status: DC | PRN
  Administered 2019-01-08 (×2): 4 mg via INTRAVENOUS
  Filled 2019-01-07 (×3): qty 2

## 2019-01-07 MED ORDER — SENNOSIDES-DOCUSATE SODIUM 8.6-50 MG PO TABS
2.0000 | ORAL_TABLET | Freq: Two times a day (BID) | ORAL | Status: DC
  Administered 2019-01-07 – 2019-01-11 (×8): 2 via ORAL
  Filled 2019-01-07 (×8): qty 2

## 2019-01-07 MED ORDER — METHOCARBAMOL 500 MG PO TABS
500.0000 mg | ORAL_TABLET | Freq: Three times a day (TID) | ORAL | Status: DC
  Administered 2019-01-07 – 2019-01-11 (×13): 500 mg via ORAL
  Filled 2019-01-07 (×13): qty 1

## 2019-01-07 MED ORDER — HYDROMORPHONE HCL 2 MG PO TABS
2.0000 mg | ORAL_TABLET | ORAL | Status: DC | PRN
  Filled 2019-01-07: qty 1

## 2019-01-07 MED ORDER — HYDROMORPHONE HCL 1 MG/ML IJ SOLN
0.5000 mg | INTRAMUSCULAR | Status: DC | PRN
  Administered 2019-01-07 – 2019-01-08 (×6): 1 mg via INTRAVENOUS
  Filled 2019-01-07 (×6): qty 1

## 2019-01-07 NOTE — ED Notes (Addendum)
Just returned from blood bank with 2nd unit of blood. With attempt to vitalize pt verbalizing several flank pain both sides. Pt appears SOB but verbalizing SOB is not new. VS obtained; low grade temperature noted; other VS seem to be trending as previously noted. Hospitalist messaged via [place]; awaiting other orders. Pt appropriate mentation. Unopened 2nd unit of blood returned to blood bank to hold until okayed by provided. As charted/witnessed by Sheran Luz and Lilia Pro RN.

## 2019-01-07 NOTE — Progress Notes (Addendum)
[facility] ([facility]osp[facility] GIP RN Visit @ 11a  This is a related and covered GIP admission with a hospice diagnosis of cervical cancer per Dr. Tomasa Hosteller.  Pt called hospice last night with concerns of bleeding from her nephrostomy tubes and increased pain.  She requested to be evaluated at the ED.  She is admitted to the hospital with anemia and pain.  Visited pt at the bedside.  She has been experiencing significant pain.  Four days ago, methadone was started as the patient was thought to be experiencing hyperalgesia.  Once in ED, she voiced concerns that the methadone was not working, and initially was going to be treated with dilaudid.  Discussion occurred, methadone was restarted with dilaudid scheduled for breakthrough pain.    V/S:  99.1 oral, 115/78, HR 102, RR 20, SPO2 98% RA  I&O:  1657/832 from 7am-7pm  Lab work:  Creat 1.3, GFR 55, hgb 5.3, RBC 1.8, HCT 18.1, stool and urine + for blood   IVs, infusions, PRNs:  NS @ 117ml/hr, dilaudid 0.5mg  IV x1, MSIR 30mg  PO x 1, 2 UPRBCs  Diagnostics: CT Renal Sone Study IMPRESSION: 1. High-density clot within the urinary bladder from uncertain source. 2. Bilateral percutaneous nephrostomy tubes with no hydronephrosis. Located right ureteral stent. 3. Suspect subacute or chronic femoral DVT on the right, please correlate with venous ultrasound  Problem List:  Anemia:  Receiving her second unit of PRBCs Pain:  Methadone 5mg  Q8H PO, dilaudid 0.5-1 mg IV Q2H PRN, dilaudid 2mg  PO Q3H DVT:  Known issue, decision was made not to treat; note from Dr. Alvy Bimler 2/11 Right-sided DVT  She is not able to tolerate anticoagulation therapy due to persistent vaginal fistula that would predispose her to  excessive risk of bleeding.  I discussed with the patient and her husband the risks, benefits, side effects of  anticoagulation therapy versus observation only.  I do not recommend IVC filter placement as it does not treat her  current blood clot.  Due to  high risk of bleeding, and her poor performance status, she accepts the risk of not being anticoagulateg    Goals of Care:  Need clarifying, she rescinded her DNR status.  PMT was consulted in ED to help with GOCs and pain management.  IDT:  Updated  Discharge planning:  Unclear at this time.  Spouse has voiced her care is too much, her adult children are wanting to honor her wishes to care for her at home.  Transfer summary and medication list placed on shadow chart.  Please call with any hospice related questions/concerns  Venia Carbon BSN, RN [facility] Liaison [phone]  **addendum 01/10/19 0830, this is a related admission, pt is self pay with hospice, [employer] will be her covering payor

## 2019-01-07 NOTE — ED Notes (Signed)
Patient completed blood consent form, form at bedside.

## 2019-01-07 NOTE — ED Triage Notes (Signed)
Patient arrives via EMS from home. Bleeding from one of her nephrostomy bags, 50 cc of blood, HIV positive  Vitals  -BP 106/70 -P 120 -RR 22 -O2 98% RA

## 2019-01-07 NOTE — ED Notes (Signed)
Pt had total of 325 cc out put from right nephrostomy while in ED, dark red in color,  Total of 550 cc out put from left nephrostomy while in ed, yellow in color

## 2019-01-07 NOTE — ED Notes (Signed)
Hospitalist at bedside 

## 2019-01-07 NOTE — ED Notes (Signed)
Hospitalist stated to writer to give iv pain med then continue with second unit of blood.
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
## 2019-01-07 NOTE — Progress Notes (Signed)
[facility]  Visited pt at the bedside with her homecare RN present.  Pt moaning and grimacing c/o pain.  Pt had some discussion with MD that she did not feel her methadone was working, and dilaudid was added.  Plan to admit.  Pt with known DVT that is not being treated per Dr. Alvy Bimler.  [place] will continue to follow.  Venia Carbon BSN, RN [facility] Liaison  [phone]

## 2019-01-07 NOTE — ED Notes (Signed)
Date and time results received: 01/07/19 4:12 AM  (use smartphrase ".now" to insert current time)  Test: HGB Critical Value: 5.3  Name of Provider Notified: Dr.Pollina  Orders Received? Or Actions Taken?:

## 2019-01-07 NOTE — ED Notes (Signed)
Bed: WA25 Expected date:  Expected time:  Means of arrival:  Comments: 

## 2019-01-07 NOTE — ED Provider Notes (Signed)
[facility] DEPT Provider Note   CSN: [id number] Arrival date & time: 01/07/19  0309    History   Chief Complaint Chief Complaint  Patient presents with  . bleeding from nephrostomy tube    HPI Misty Thomas is a 53 y.o. female.     Patient presents to the emergency department for evaluation of bleeding from nephrostomy tube.  Patient has bilateral nephrostomy tubes.  She reports that she started noticing blood in the tube tonight.  She is experiencing her chronic lower abdominal and pelvic pain but also having some pain in the right flank area.  She has not had any fever.     Past Medical History:  Diagnosis Date  . Acquired pancytopenia ([place]) 03/2018  . Anemia    Mild  . Cancer associated pain   . 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 Chemotherapy induced nausea and vomiting   . Diabetes mellitus type 2, diet-controlled ([place])    pt. denies No meds  . Frequency of urination   . History of cellulitis 2018   bilateral lower leg w/ mrsa  . History of external beam radiation therapy 02-05-2018  to 03-16-2018   cervical cancer  . History of MRSA infection 2018   w/ bilateral lower leg cellulitis  . HIV (human immunodeficiency virus infection) ([place])    asymptomatic  . Hypomagnesemia 03/2018   Severe---- takes oral magnesium and IV magnesium as needed (cancer center)  . Intermittent diarrhea    due to radiation/ chemo  . Nocturia   . Port-A-Cath in place    Power port  . Radiation burn    LOWER ABD.  04-06-2018  per is healing  . Subclinical hypothyroidism    w/ thyroiditis , dx 01-22-2018 PET scan  . Thrombocytopenia ([place])   . Wears glasses     Patient Active Problem List   Diagnosis  Date Noted  . Pressure injury of skin 12/17/2018  . Gram-negative bacteremia 12/17/2018  . Pseudomonas infection 12/17/2018  . Sepsis ([place]) 12/15/2018  . Complicated UTI (urinary tract infection) 12/15/2018  . Acute diarrhea 12/15/2018  . Hyperbilirubinemia 12/15/2018  . Metabolic acidosis 32/44/0102  . Bilateral leg edema 11/26/2018  . Weakness generalized   . DNR (do not resuscitate)   . Perirectal abscess   . Goals of care, counseling/discussion   . AKI (acute kidney injury) ([place]) 10/31/2018  . Palliative care by specialist   . 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 Subclinical hypothyroidism   . Port-A-Cath in place   . History of external beam radiation therapy   . Back pain 10/27/2018  . Presacral pelvic abscess from necrotic cervical cancer & rectovaginal fistula 10/26/2018  . DNR (do not resuscitate) discussion 10/15/2018  . Hydronephrosis 10/10/2018  . Other constipation 07/20/2018  . Preventive measure 07/20/2018  . Routine screening for STI (sexually transmitted infection) 07/02/2018  . Vaccine counseling 07/02/2018  . Hypomagnesemia 02/22/2018  . Pancytopenia, acquired ([place]) 02/22/2018  . Chemotherapy-induced nausea 02/15/2018  . Diarrhea 02/15/2018  . HIV (human immunodeficiency virus infection) ([place]) 02/09/2018  . Acquired hypothyroidism 02/08/2018  . Anemia, blood loss 02/08/2018  . Abnormal thyroid uptake 01/25/2018  . Cancer associated pain 01/25/2018  . Diabetes mellitus without complication ([place]) 72/53/6644  . Abnormal vaginal bleeding   .  Primary cervical cancer with metastasis - Stage IV 01/04/2018    Past Surgical History:  Procedure Laterality Date  . CYSTOSCOPY     retrograde pyelogram right ureteral stent placement  . CYSTOSCOPY W/ URETERAL STENT PLACEMENT Bilateral 10/24/2018   Procedure: CYSTOSCOPY WITH BILATERAL RETROGRADE PYELOGRAM RIGHT Wyvonnia Dusky STENT PLACEMENT;  Surgeon: Ardis Hughs, MD;  Location: [facility];   Service: Urology;  Laterality: Bilateral;  . EUA/ CERVICAL BX  01-04-2018   dr Ihor Dow  [facility]  . IR FLUORO GUIDE PORT INSERTION RIGHT  02/01/2018  . IR NEPHROSTOMY EXCHANGE LEFT  12/10/2018  . IR NEPHROSTOMY EXCHANGE RIGHT  12/10/2018  . IR NEPHROSTOMY PLACEMENT LEFT  11/04/2018  . IR NEPHROSTOMY PLACEMENT RIGHT  11/04/2018  . TANDEM RING INSERTION N/A 03/20/2018   Procedure: TANDEM RING INSERTION;  Surgeon: Gery Pray, MD;  Location: [facility];  Service: Urology;  Laterality: N/A;  . TANDEM RING INSERTION N/A 03/26/2018   Procedure: TANDEM RING INSERTION;  Surgeon: Gery Pray, MD;  Location: [facility];  Service: Urology;  Laterality: N/A;  . TANDEM RING INSERTION N/A 04/04/2018   Procedure: TANDEM RING INSERTION;  Surgeon: Gery Pray, MD;  Location: [facility];  Service: Urology;  Laterality: N/A;  . TANDEM RING INSERTION N/A 04/12/2018   Procedure: TANDEM RING INSERTION;  Surgeon: Gery Pray, MD;  Location: [facility];  Service: Urology;  Laterality: N/A;  . TANDEM RING INSERTION N/A 04/18/2018   Procedure: TANDEM RING INSERTION;  Surgeon: Gery Pray, MD;  Location: [facility];  Service: Urology;  Laterality: N/A;  . TUBAL LIGATION Bilateral 1990s     OB History    Gravida  5   Para  3   Term  3   Preterm      AB  2   Living  3     SAB      TAB  2   Ectopic      Multiple      Live Births               Home Medications    Prior to Admission medications   Medication Sig Start Date End Date Taking? Authorizing Provider  acetaminophen (TYLENOL) 500 MG tablet Take 500 mg by mouth every 6 (six) hours as needed for mild pain.    [provider]  BIKTARVY 50-200-25 MG TABS tablet TAKE 1 TABLET BY MOUTH DAILY. 09/14/18   Comer, Okey Regal, MD  dexamethasone (DECADRON) 4 MG tablet Take 4 mg by mouth as directed. Take 5 pills the night before and 5 pills the morning of chemo,  every 3 weeks    [provider]  furosemide (LASIX) 20 MG tablet Take 20 mg by mouth daily as needed for edema.    [provider]  lidocaine-prilocaine (EMLA) cream Apply 1 application topically as needed. 11/15/18   [name], MD  loperamide (IMODIUM) 2 MG capsule Take 2 mg by mouth as needed for diarrhea or loose stools.    [provider]  magnesium oxide (MAG-OX) 400 (241.3 Mg) MG tablet Take 1 tablet (400 mg total) by mouth 2 (two) times daily. 03/08/18   [name], MD  morphine (MS CONTIN) 30 MG 12 hr tablet Take 1 tablet (30 mg total) by mouth every 12 (twelve) hours. 12/19/18   Nita Sells, MD  morphine (MSIR) 30 MG tablet Take 1 tablet (30 mg total) by mouth every 6 (six) hours as needed for  severe pain. 12/28/18   [name], MD  ondansetron (ZOFRAN) 8 MG tablet Take 1 tablet (8 mg total) by mouth every 8 (eight) hours as needed. 12/06/18   [name], MD  polyethylene glycol (MIRALAX / GLYCOLAX) packet Take 17 g by mouth daily.    [provider]  prochlorperazine (COMPAZINE) 10 MG tablet Take 10 mg by mouth every 6 (six) hours as needed.    [provider]    Family History Family History  Problem Relation Age of Onset  . Cancer Maternal Grandmother        unknown ca    Social History Social History   Tobacco Use  . Smoking status: Never Smoker  . Smokeless tobacco: Never Used  Substance Use Topics  . Alcohol use: Not Currently    Frequency: Never  . Drug use: No     Allergies   Patient has no known allergies.   Review of Systems Review of Systems  Gastrointestinal: Positive for abdominal pain.  Genitourinary: Positive for hematuria.  All other systems reviewed and are negative.    Physical Exam Updated Vital Signs BP 101/64   Pulse 100   Temp 98.1 F (36.7 C) (Oral)   Resp 16   Ht 5\' 1"  (1.549 m)   Wt 72.6 kg   LMP 02/03/2018   SpO2 98%   BMI 30.23 kg/m   Physical Exam Vitals signs and  nursing note reviewed.  Constitutional:      General: She is not in acute distress.    Appearance: Normal appearance. She is well-developed.  HENT:     Head: Normocephalic and atraumatic.     Right Ear: Hearing normal.     Left Ear: Hearing normal.     Nose: Nose normal.  Eyes:     Conjunctiva/sclera: Conjunctivae normal.     Pupils: Pupils are equal, round, and reactive to light.  Neck:     Musculoskeletal: Normal range of motion and neck supple.  Cardiovascular:     Rate and Rhythm: Regular rhythm.     Heart sounds: S1 normal and S2 normal. No murmur. No friction rub. No gallop.   Pulmonary:     Effort: Pulmonary effort is normal. No respiratory distress.     Breath sounds: Normal breath sounds.  Chest:     Chest wall: No tenderness.  Abdominal:     General: Bowel sounds are normal.     Palpations: Abdomen is soft.     Tenderness: There is abdominal tenderness in the right lower quadrant, suprapubic area and left lower quadrant. There is right CVA tenderness. There is no guarding or rebound. Negative signs include Murphy's sign and McBurney's sign.     Hernia: No hernia is present.  Musculoskeletal: Normal range of motion.  Skin:    General: Skin is warm and dry.     Findings: No rash.  Neurological:     Mental Status: She is alert and oriented to person, place, and time.     GCS: GCS eye subscore is 4. GCS verbal subscore is 5. GCS motor subscore is 6.     Cranial Nerves: No cranial nerve deficit.     Sensory: No sensory deficit.     Coordination: Coordination normal.  Psychiatric:        Speech: Speech normal.        Behavior: Behavior normal.        Thought Content: Thought content normal.      ED Treatments / Results  Labs (all labs ordered are listed, but only abnormal results are displayed) Labs Reviewed  CBC WITH DIFFERENTIAL/PLATELET - Abnormal; Notable for the following components:      Result Value   RBC 1.81 (*)    Hemoglobin 5.3 (*)    HCT 18.1 (*)      MCHC 29.3 (*)    RDW 17.2 (*)    Abs Immature Granulocytes 0.15 (*)    All other components within normal limits  COMPREHENSIVE METABOLIC PANEL - Abnormal; Notable for the following components:   Creatinine, Ser 1.30 (*)    Calcium 7.8 (*)    Albumin 2.3 (*)    AST 10 (*)    GFR calc non Af Amer 47 (*)    GFR calc Af Amer 55 (*)    Anion gap 3 (*)    All other components within normal limits  POC OCCULT BLOOD, ED - Abnormal; Notable for the following components:   Fecal Occult Bld POSITIVE (*)    All other components within normal limits  PROTIME-INR  TYPE AND SCREEN    EKG None  Radiology Ct Renal Stone Study  Result Date: 01/07/2019 CLINICAL DATA:  Bilateral nephrostomy tube with pain and bleeding on the right. EXAM: CT ABDOMEN AND PELVIS WITHOUT CONTRAST TECHNIQUE: Multidetector CT imaging of the abdomen and pelvis was performed following the standard protocol without IV contrast. COMPARISON:  12/15/2018 FINDINGS: Lower chest:  No contributory findings. Hepatobiliary: No focal liver abnormality.No evidence of biliary obstruction or stone. Pancreas: Unremarkable. Spleen: Unremarkable. Adrenals/Urinary Tract: Negative adrenals. Bilateral percutaneous nephrostomy tube that is located. There is also a right-sided ureteral stent. No hydronephrosis. Cystic density about the left kidney which could be parenchymal or subcapsular, up to 4.8 cm and stable. Indistinct high-density within the bladder, compatible with hemorrhage in this clinical setting. No superimposed stone is seen. No detectable bladder wall thickening to imply radiation cystitis. Stomach/Bowel: Formed stool throughout most of the colon. Negative for obstruction or visible bowel wall thickening. Vascular/Lymphatic: Asymmetric right lower extremity edema with common femoral vein mural thickening, suspect there is subacute or chronic DVT when correlated with previous imaging. Reproductive:History of cervical cancer with indistinct  soft tissue planes in the pelvis correlating with radiotherapy. Fibroid is present on the right, measuring 3.5 cm. Other: No ascites or pneumoperitoneum. Indistinct retroperitoneal soft tissues without discrete adenopathy, stable. Musculoskeletal: Osteopenia.  No acute finding IMPRESSION: 1. High-density clot within the urinary bladder from uncertain source. 2. Bilateral percutaneous nephrostomy tubes with no hydronephrosis. Located right ureteral stent. 3. Suspect subacute or chronic femoral DVT on the right, please correlate with venous ultrasound. Electronically Signed   By: Monte Fantasia M.D.   On: 01/07/2019 04:43    Procedures Procedures (including critical care time)  Medications Ordered in ED Medications  0.9 %  sodium chloride infusion ( Intravenous New Bag/Given 01/07/19 0412)  HYDROmorphone (DILAUDID) injection 1 mg (1 mg Intravenous Given 01/07/19 0412)  ondansetron (ZOFRAN) injection 4 mg (4 mg Intravenous Given 01/07/19 0412)     Initial Impression / Assessment and Plan / ED Course  I have reviewed the triage vital signs and the nursing notes.  Pertinent labs & imaging results that were available during my care of the patient were reviewed by me and considered in my medical decision making (see chart for details).        Patient presents to the emergency department for evaluation of blood in her nephrostomy bag.  There is approximately 50 mL of blood in the  bag currently.  Patient complaining of lower abdominal and pelvic pain which is chronic for her secondary to her cancer, but also has some right flank pain.  She is afebrile.  Vital signs are essentially normal.  Blood work, however, does reveal significant anemia.  Rectal exam performed, stool is brown but heme positive.  Anemia likely multifactorial.  CT scan performed, does not show hydronephrosis or dislodgment of the nephrostomy tube.  Discussed briefly with Dr. Louis Meckel, does not feel any further urology intervention  necessary at this time as her creatinine has normalized and tube appears to be in place.  Patient will require hospitalization for blood transfusion.  Additionally, CT scan shows possible DVT in the right femoral vein.  Will require duplex exam during hospitalization.  Final Clinical Impressions(s) / ED Diagnoses   Final diagnoses:  Anemia, unspecified type    ED Discharge Orders    None       Orpah Greek, MD 01/07/19 [phone]

## 2019-01-08 DIAGNOSIS — Z7189 Other specified counseling: Secondary | ICD-10-CM

## 2019-01-08 DIAGNOSIS — G893 Neoplasm related pain (acute) (chronic): Secondary | ICD-10-CM

## 2019-01-08 LAB — COMPREHENSIVE METABOLIC PANEL
ALT: 7 U/L (ref 0–44)
AST: 11 U/L — ABNORMAL LOW (ref 15–41)
Albumin: 2.4 g/dL — ABNORMAL LOW (ref 3.5–5.0)
Alkaline Phosphatase: 127 U/L — ABNORMAL HIGH (ref 38–126)
Anion gap: 8 (ref 5–15)
BUN: 11 mg/dL (ref 6–20)
CHLORIDE: 108 mmol/L (ref 98–111)
CO2: 22 mmol/L (ref 22–32)
CREATININE: 1.44 mg/dL — AB (ref 0.44–1.00)
Calcium: 8 mg/dL — ABNORMAL LOW (ref 8.9–10.3)
GFR calc Af Amer: 48 mL/min — ABNORMAL LOW (ref >60)
GFR, EST NON AFRICAN AMERICAN: 42 mL/min — AB (ref >60)
Glucose, Bld: 82 mg/dL (ref 70–99)
Potassium: 4 mmol/L (ref 3.5–5.1)
Sodium: 138 mmol/L (ref 135–145)
Total Bilirubin: 0.9 mg/dL (ref 0.3–1.2)
Total Protein: 6.7 g/dL (ref 6.5–8.1)

## 2019-01-08 LAB — CBC
HCT: 26.9 % — ABNORMAL LOW (ref 36.0–46.0)
Hemoglobin: 8.3 g/dL — ABNORMAL LOW (ref 12.0–15.0)
MCH: 29.3 pg (ref 26.0–34.0)
MCHC: 30.9 g/dL (ref 30.0–36.0)
MCV: 95.1 fL (ref 80.0–100.0)
NRBC: 0 % (ref 0.0–0.2)
Platelets: 312 10*3/uL (ref 150–400)
RBC: 2.83 MIL/uL — ABNORMAL LOW (ref 3.87–5.11)
RDW: 17.6 % — ABNORMAL HIGH (ref 11.5–15.5)
WBC: 10.8 10*3/uL — AB (ref 4.0–10.5)

## 2019-01-08 LAB — BPAM RBC
Blood Product Expiration Date: 202003302359
Blood Product Expiration Date: 202003302359
ISSUE DATE / TIME: 202003020619
ISSUE DATE / TIME: 202003021331
Unit Type and Rh: 9500
Unit Type and Rh: 9500

## 2019-01-08 LAB — TYPE AND SCREEN
ABO/RH(D): O NEG
ANTIBODY SCREEN: NEGATIVE
Unit division: 0
Unit division: 0

## 2019-01-08 MED ORDER — SODIUM CHLORIDE 0.9% FLUSH
10.0000 mL | INTRAVENOUS | Status: DC | PRN
  Administered 2019-01-09 – 2019-01-11 (×2): 10 mL
  Filled 2019-01-08 (×2): qty 40

## 2019-01-08 MED ORDER — MIRTAZAPINE 15 MG PO TABS
7.5000 mg | ORAL_TABLET | Freq: Every day | ORAL | Status: DC
  Administered 2019-01-08 – 2019-01-10 (×3): 7.5 mg via ORAL
  Filled 2019-01-08 (×3): qty 1

## 2019-01-08 MED ORDER — ENSURE ENLIVE PO LIQD
237.0000 mL | Freq: Two times a day (BID) | ORAL | Status: DC
  Administered 2019-01-08 – 2019-01-10 (×3): 237 mL via ORAL

## 2019-01-08 MED ORDER — PROMETHAZINE HCL 25 MG/ML IJ SOLN
12.5000 mg | Freq: Four times a day (QID) | INTRAMUSCULAR | Status: DC | PRN
  Administered 2019-01-08: 12.5 mg via INTRAVENOUS
  Filled 2019-01-08: qty 1

## 2019-01-08 MED ORDER — METOPROLOL TARTRATE 5 MG/5ML IV SOLN
5.0000 mg | Freq: Once | INTRAVENOUS | Status: AC
  Administered 2019-01-08: 5 mg via INTRAVENOUS
  Filled 2019-01-08: qty 5

## 2019-01-08 MED ORDER — HYDROMORPHONE HCL 1 MG/ML IJ SOLN
1.0000 mg | Freq: Once | INTRAMUSCULAR | Status: AC
  Administered 2019-01-08: 1 mg via INTRAVENOUS
  Filled 2019-01-08: qty 1

## 2019-01-08 MED ORDER — HYDROMORPHONE HCL 1 MG/ML IJ SOLN
1.0000 mg | Freq: Once | INTRAMUSCULAR | Status: AC
  Administered 2019-01-09: 1 mg via INTRAVENOUS
  Filled 2019-01-08: qty 1

## 2019-01-08 MED ORDER — HYDROMORPHONE HCL 1 MG/ML IJ SOLN
0.5000 mg | INTRAMUSCULAR | Status: DC | PRN
  Administered 2019-01-08 – 2019-01-11 (×11): 0.5 mg via INTRAVENOUS
  Filled 2019-01-08 (×11): qty 0.5

## 2019-01-08 MED ORDER — HYDROMORPHONE HCL 2 MG PO TABS
1.0000 mg | ORAL_TABLET | ORAL | Status: DC | PRN
  Administered 2019-01-08 – 2019-01-10 (×8): 1 mg via ORAL
  Filled 2019-01-08 (×8): qty 1

## 2019-01-08 MED ORDER — SODIUM CHLORIDE 0.9% FLUSH
10.0000 mL | Freq: Two times a day (BID) | INTRAVENOUS | Status: DC
  Administered 2019-01-08 – 2019-01-09 (×3): 10 mL

## 2019-01-08 MED ORDER — MIRTAZAPINE 15 MG PO TBDP: 7.5000 mg | ORAL_TABLET | Freq: Every day | ORAL | Status: DC

## 2019-01-08 NOTE — Progress Notes (Signed)
Initial Nutrition Assessment  DOCUMENTATION CODES:   Not applicable  INTERVENTION:  Ensure Enlive po BID, each supplement provides 350 kcal and 20 grams of protein   NUTRITION DIAGNOSIS:   Increased nutrient needs related to cancer and cancer related treatments, chronic illness, wound healing(Pressure Injury Stage II) as evidenced by estimated needs.   GOAL:   Patient will meet greater than or equal to 90% of their needs   MONITOR:   PO intake, Supplement acceptance  REASON FOR ASSESSMENT:   Malnutrition Screening Tool(MST 3)    ASSESSMENT:   Pt is a 5y F with PMH of metastatic cervical Cancer, DVT, HIV (antiretrovirals) T2DM-Diet controlled, cancer related pain, bilateral PCN placement for hydronephrosis and is on hospice care. Pt now admitted for Nephrostomy tube bleed.  When seeing pt for assessment, pt was in extreme pain doubled over holding side and back, crying and wailing. Pt would attempt to answer some questions but most answers were "I don't know". Pt reports that her appetite was fine. Meal tray in room was 50%  completed. She ate half of a grilled cheese sandwich and about half of her tomato soup. Pt was able to report that she drinks Boost at home, but unable to state how many or how much. Pt wasn't able to provide a diet recall.   Pt verifies current weight of 159.7#. Pt was unable to to recall weight loss or time frame. However, per chart she has been progressively loosing weight over the last 10 encounters. October 25, 2018 pt was at 83.2kg and today 72.6kg which would be a 12.7% weight loss, significant for time frame.   Per chart pt at baseline ambulates with supports, and is independent for most of her ADLs, unsure of current status of pt.   With current information and NFPE findings pt doesn't meet criteria for malnutrition, however, pt is at risk for malnutrition. Pt is currently being followed by palliative and hospice care.   Labs reviewed:   Corrected Ca 9.28 (N)  Medications reviewed and include:  0.9% Sodium Chloride Infusion 83mL/hr    NUTRITION - FOCUSED PHYSICAL EXAM:    Most Recent Value  Orbital Region  No depletion  Upper Arm Region  No depletion  Thoracic and Lumbar Region  No depletion  Buccal Region  No depletion  Temple Region  No depletion  Clavicle Bone Region  No depletion  Clavicle and Acromion Bone Region  No depletion  Scapular Bone Region  No depletion  Dorsal Hand  No depletion  Patellar Region  No depletion  Anterior Thigh Region  No depletion  Posterior Calf Region  No depletion  Edema (RD Assessment)  Mild  Hair  Reviewed  Eyes  Reviewed  Mouth  Reviewed  Skin  Reviewed  Nails  Reviewed       Diet Order:   Diet Order            Diet regular Room service appropriate? Yes; Fluid consistency: Thin  Diet effective now              EDUCATION NEEDS:   No education needs have been identified at this time  Skin:  Skin Assessment: Skin Integrity Issues: Skin Integrity Issues:: Stage II Stage II: R Thigh  Last BM:  unknown  Height:   Ht Readings from Last 1 Encounters:  01/07/19 5\' 1"  (1.549 m)    Weight:   Wt Readings from Last 1 Encounters:  01/07/19 72.6 kg    Ideal Body Weight:  47.7 kg  BMI:  Body mass index is 30.23 kg/m.  Estimated Nutritional Needs:   Kcal:  2300-2500  Protein:  115-125 grams  Fluid:  >2L    Herma Carson, [place] Dietetic Intern

## 2019-01-08 NOTE — Evaluation (Signed)
Physical Therapy Evaluation Patient Details Name: Misty Thomas MRN: [id number] DOB: [birth year] Today's Date: 01/08/2019   History of Present Illness  53 yo female admitted with nephrostomy tube bleeding, weakness, cancer related pain. Hx of HIV, cervical ca with mets, DVT, DM, bilater PCN placement  Clinical Impression  On eval, pt required Min assist for bed mobility and Mod assist for attempts at standing. Mobility is significantly limited by pain. Pt was tearful, moaning/groaning, and restless during session. No family present. Will follow and progress activity as able. Progress will be significantly limited until patient's pain is controlled.     Follow Up Recommendations SNF;Supervision/Assistance - 24 hour(vs back home with hospice)    Equipment Recommendations  Rolling walker with 5" wheels;Wheelchair (measurements PT);Wheelchair cushion (measurements PT);3in1 (PT);Hospital bed    Recommendations for Other Services       Precautions / Restrictions Precautions Precautions: Fall Precaution Comments: 2 drains (L and R abdomen) Restrictions Weight Bearing Restrictions: No      Mobility  Bed Mobility Overal bed mobility: Needs Assistance Bed Mobility: Sidelying to Sit;Sit to Sidelying   Sidelying to sit: Min assist     Sit to sidelying: Min assist    Transfers Overall transfer level: Needs assistance Equipment used: Rolling walker (2 wheeled) Transfers: Sit to/from Stand Sit to Stand: Mod assist;From elevated surface         General transfer comment: Assist to rise ~3/4 of a full stand after 2 attempts. Pt requested to return to bed.   Ambulation/Gait             General Gait Details: Nt-pt unable to attempt on today  Stairs            Wheelchair Mobility    Modified Rankin (Stroke Patients Only)       Balance Overall balance assessment: Needs assistance Sitting-balance support: Feet supported;Bilateral upper extremity supported Sitting  balance-Leahy Scale: Good                                       Pertinent Vitals/Pain Pain Assessment: Faces Faces Pain Scale: Hurts worst Pain Location: abdomen Pain Descriptors / Indicators: Crying;Grimacing;Discomfort;Restless Pain Intervention(s): Limited activity within patient's tolerance;Repositioned(per hospice team in room with pt, RN is aware and bringing pain meds)    Home Living Family/patient expects to be discharged to:: Unsure Living Arrangements: Spouse/significant other Available Help at Discharge: Family;Available 24 hours/day Type of Home: House Home Access: Stairs to enter   [employer] of Steps: 2 Home Layout: One level        Prior Function Level of Independence: Independent               Hand Dominance        Extremity/Trunk Assessment   Upper Extremity Assessment Upper Extremity Assessment: Generalized weakness    Lower Extremity Assessment Lower Extremity Assessment: Generalized weakness    Cervical / Trunk Assessment Cervical / Trunk Assessment: Normal  Communication   Communication: No difficulties  Cognition Arousal/Alertness: Awake/alert Behavior During Therapy: WFL for tasks assessed/performed Overall Cognitive Status: Within Functional Limits for tasks assessed                                        General Comments      Exercises     Assessment/Plan  PT Assessment Patient needs continued PT services  PT Problem List Decreased strength;Decreased balance;Decreased mobility;Pain;Decreased activity tolerance;Decreased knowledge of use of DME       PT Treatment Interventions DME instruction;Gait training;Therapeutic activities;Functional mobility training;Balance training;Patient/family education;Therapeutic exercise    PT Goals (Current goals can be found in the Care Plan section)  Acute Rehab PT Goals Patient Stated Goal: less pain PT Goal Formulation: With patient Time  For Goal Achievement: 01/22/19 Potential to Achieve Goals: Fair    Frequency Min 2X/week   Barriers to discharge        Co-evaluation               AM-PAC PT "6 Clicks" Mobility  Outcome Measure Help needed turning from your back to your side while in a flat bed without using bedrails?: A Little Help needed moving from lying on your back to sitting on the side of a flat bed without using bedrails?: A Little Help needed moving to and from a bed to a chair (including a wheelchair)?: A Lot Help needed standing up from a chair using your arms (e.g., wheelchair or bedside chair)?: A Lot Help needed to walk in hospital room?: Total Help needed climbing 3-5 steps with a railing? : Total 6 Click Score: 12    End of Session   Activity Tolerance: Patient limited by pain Patient left: in bed;with call bell/phone within reach;with bed alarm set   PT Visit Diagnosis: Pain;Muscle weakness (generalized) (M62.81);Difficulty in walking, not elsewhere classified (R26.2)    Time: 0211-1735 PT Time Calculation (min) (ACUTE ONLY): 14 min   Charges:   PT Evaluation $PT Eval Moderate Complexity: [place], PT Acute Rehabilitation Services Pager: [phone] Office: [phone]

## 2019-01-08 NOTE — Progress Notes (Signed)
Pt requesting to take telemetry box off. States the electrodes are hurting her skin and would like for it to be taken off for the evening. Discussed the importance of the heart monitor, but the pt would like rest and to not have skin irritation throughout the evening. Put monitor on standby and pt will discuss with MD in AM about discontinuing the telemetry order.

## 2019-01-08 NOTE — Progress Notes (Signed)
Manufacturing engineer Visit ~2:00pm Rm 1508  LCSW completed PRN visit at [place] entered Pt room and dietetic intern, Rodman Key, was present with Pt. Pt was whimpering in pain/agreed to it being in her stomach though later in the visit indicated her back as well. She Is restless and appears anxious as well. Rodman Key discussed adjusting Pt bag in her bed/alerted RN of Pt pain concerns/confirmed Pt food preferences. Covering floor RN, Brittney, presented to provide pain medication and they exited shortly after this. LCSW spent time with Pt prior to [employer], [place], arrival. She was present for majority of LCSW visit/ stepped out and alerted RN of Pt need to be changed as Pt notes having BM during visit. LCSW attempted intervention of distraction through conversation re: her family visiting (she notes being called by April, her daughter, and [name], her husband) though Anderson Malta notes that Pt husband visited today. LCSW also attempted deep breathing exercise to address Pt pain/anxiety though these were not effective in reducing pain. LCSW agreed to return later in the visit to read to Pt as she states enjoying fairytales, Cinderella being her favorite. LCSW did not end up doing this due to multiple hospital staff members presenting for Pt. LCSW to follow up as appropriate re: this intervention. LCSW assisted with repositioning Pt and Pt requested LCSW rub her back which eased her whimpering. LCSW attempted conversation with Pt re: returning home though this conversation was ineffective as Pt notes not knowing about desire to return home. PT came in to assist Pt and LCSW exited with Anderson Malta to the hall/spoke with floor RN, Wyatt Portela, who had another staff member with her. Conversation held re: Pt not getting out of bed at home/not wanting to be in pain once getting up. LCSW and Anderson Malta spoke Further in the hall re: Pt while RN/other staff member had separate conversation  in the hall. Anderson Malta and LCSW spoke with PT briefly After she exited Pt room. LCSW returned to Pt when nurse technician was in her room after PT left. LCSW returned to Pt room/ requested to visit tomorrow with Pt daughter, April, and husband, [name], if possible and Pt agreed to this. LCSW called and spoke with hospital SW, [place], walking to LCSW vehicle post visit to provide information on Pt/LCSW plan to visit tomorrow. Discharge plan uncertain at this time/needs further discussion.  Please call us with any questions or concerns.  Christena Deem, LCSW Social Training and development officer [phone]

## 2019-01-08 NOTE — Progress Notes (Signed)
PROGRESS NOTE    Misty Thomas  ZOX:[id number] DOB: May 13, 1966 DOA: 01/07/2019 PCP: Patient, No Pcp Per   Brief Narrative:  Patient is a 53 year old unfortunate female with history of metastatic cervical cancer, right lower extremity DVT, bilateral percutaneous nephrostomy tube placement for hydronephrosis, type 2 diabetes mellitus, cancer-related pain who is currently being followed by hospice at home presents to the emergency department with complaints of bloody drainage in the PCN bag.  No mention of fever or chills at home.CT imaging done in the emergency department did not show any hydronephrosis or dislodgment of the tube.  ED physician discussed with urology who recommended no intervention.  Patient admitted for blood transfusion and pain management.  Palliative care has been consulted .   Assessment & Plan:   Principal Problem:   Nephrostomy tube bleed ([place]) Active Problems:   Primary cervical cancer with metastasis - Stage IV   Cancer associated pain   Diabetes mellitus without complication (HCC)   Acquired hypothyroidism   Anemia, blood loss   HIV (human immunodeficiency virus infection) ([place])   Hydronephrosis   Goals of care, counseling/discussion   Acute blood loss anemia   DVT (deep venous thrombosis) (HCC)  Nephrostomy tube bleed/acute blood loss anemia: Presented with complaints of bleeding from the nephrostomy tube.  Her right-sided nephrostomy bag filled with bloody urine.  Left-sided nephrostomy bag has clear urine. Hemoglobin in the range of 5 on presentation.  Transfused with 2 units of PRBC with improvement of hemoglobin in the range of 8 today. We will continue to monitor H&H.  Discussed with urology on presentation, urology did not recommend any change in the plan or any new intervention. FOBT was also found to be positive.  She denies any change in the color of the stool.  Currently not sure how much help will it do regarding further work-up for the positive  FOBT.  Cervical cancer stage IV: Follows with Dr. Alvy Bimler.  Not a candidate for chemotherapy.  She is being followed by hospice at home.  Currently she is cancer surgery pain.  Continue current pain medications.  She was recently hospitalized and was discharged to home on hospice.  Pain was not controlled with MS Contin and she had been transitioned to methadone .  Palliative care has been consulted to discuss goals of care. Currently she is full code.  When she was discharged on 12/19/18 she was DNR.  CODE STATUS has been changed.  I have discussed with the patient at the bedside regarding her poor prognosis  History of HIV: On Biktarvy which we will continue.  Right lower extremity DVT: Not a candidate for anticoagulation or IVC filter placement.  Has severe bilateral lower extremities more on the right.  Sinus tachycardia: Most likely associated with pain.  We will continue to monitor.  Acute kidney injury versus CKD: Renal function has actually improved.  Her creatinine when she was discharged on 2/12 was of 3.53.  Renal dysfunction associated with cervical cancer causing hydronephrosis and improvement has been seen after nephrostomy tube placement. CT imaging also showed High-density clot within the urinary bladder from uncertain source.  Urology did not recommend any further intervention.  Deconditioning/debility: We will request for physical therapy evaluation.          DVT prophylaxis: SCD Code Status: Full Family Communication: None present at the bedside Disposition Plan: Home after PT and palliative care evaluation   Consultants: None  Procedures: None  Antimicrobials:  Anti-infectives (From admission, onward)   Start  Dose/Rate Route Frequency Ordered Stop   01/07/19 1000  bictegravir-emtricitabine-tenofovir AF (BIKTARVY) 50-200-25 MG per tablet 1 tablet     1 tablet Oral Daily 01/07/19 0945        Subjective: Patient seen and examined the bedside this morning.   Currently hemodynamically stable.  Looks very weak.  Denies any specific complaints.  Objective: Vitals:   01/07/19 1356 01/07/19 1541 01/07/19 2052 01/08/19 0521  BP: 115/78 109/69 118/72 108/88  Pulse: (!) 105 (!) 104 (!) 107 (!) 110  Resp: 20 20 16 16   Temp: 99.1 F (37.3 C) 98.4 F (36.9 C) 99.2 F (37.3 C) 98.9 F (37.2 C)  TempSrc: Oral     SpO2: 98% 98% 94% 99%  Weight:      Height:        Intake/Output Summary (Last 24 hours) at 01/08/2019 1201 Last data filed at 01/07/2019 2100 Gross per 24 hour  Intake 1342.12 ml  Output 1175 ml  Net 167.12 ml   Filed Weights   01/07/19 0317  Weight: 72.6 kg    Examination:  General exam: Chronically ill looking, generalized weakness  HEENT:PERRL,Oral mucosa moist, Ear/Nose normal on gross exam Respiratory system: Bilateral equal air entry, normal vesicular breath sounds, no wheezes or crackles  Cardiovascular system: S1 & S2 heard, RRR. No JVD, murmurs, rubs, gallops or clicks.Marland Kitchen  Port on the right chest Gastrointestinal system: Abdomen is nondistended, soft and nontender. No organomegaly or masses felt. Normal bowel sounds heard. Bilateral PCN tubes with bags.  Right bag filled with sanguinous urine, left bag clear Central nervous system: Alert and oriented. No focal neurological deficits. Extremities: Bilateral lower extremity edema more on the right, no clubbing ,no cyanosis, distal peripheral pulses palpable. Skin: No rashes, lesions or ulcers,no icterus ,no pallor   Data Reviewed: I have personally reviewed following labs and imaging studies  CBC: Recent Labs  Lab 01/07/19 0353 01/08/19 0807  WBC 9.5 10.8*  NEUTROABS 6.5  --   HGB 5.3* 8.3*  HCT 18.1* 26.9*  MCV 100.0 95.1  PLT 301 295   Basic Metabolic Panel: Recent Labs  Lab 01/07/19 0353 01/08/19 0807  NA 135 138  K 4.1 4.0  CL 107 108  CO2 25 22  GLUCOSE 86 82  BUN 12 11  CREATININE 1.30* 1.44*  CALCIUM 7.8* 8.0*   GFR: Estimated Creatinine  Clearance: 41.6 mL/min (A) (by C-G formula based on SCr of 1.44 mg/dL (H)). Liver Function Tests: Recent Labs  Lab 01/07/19 0353 01/08/19 0807  AST 10* 11*  ALT 8 7  ALKPHOS 123 127*  BILITOT 1.1 0.9  PROT 6.6 6.7  ALBUMIN 2.3* 2.4*   No results for input(s): LIPASE, AMYLASE in the last 168 hours. No results for input(s): AMMONIA in the last 168 hours. Coagulation Profile: Recent Labs  Lab 01/07/19 0353  INR 1.1   Cardiac Enzymes: No results for input(s): CKTOTAL, CKMB, CKMBINDEX, TROPONINI in the last 168 hours. BNP (last 3 results) No results for input(s): PROBNP in the last 8760 hours. HbA1C: No results for input(s): HGBA1C in the last 72 hours. CBG: No results for input(s): GLUCAP in the last 168 hours. Lipid Profile: No results for input(s): CHOL, HDL, LDLCALC, TRIG, CHOLHDL, LDLDIRECT in the last 72 hours. Thyroid Function Tests: No results for input(s): TSH, T4TOTAL, FREET4, T3FREE, THYROIDAB in the last 72 hours. Anemia Panel: No results for input(s): VITAMINB12, FOLATE, FERRITIN, TIBC, IRON, RETICCTPCT in the last 72 hours. Sepsis Labs: No results for input(s): PROCALCITON, LATICACIDVEN  in the last 168 hours.  No results found for this or any previous visit (from the past 240 hour(s)).       Radiology Studies: Ct Renal Stone Study  Result Date: 01/07/2019 CLINICAL DATA:  Bilateral nephrostomy tube with pain and bleeding on the right. EXAM: CT ABDOMEN AND PELVIS WITHOUT CONTRAST TECHNIQUE: Multidetector CT imaging of the abdomen and pelvis was performed following the standard protocol without IV contrast. COMPARISON:  12/15/2018 FINDINGS: Lower chest:  No contributory findings. Hepatobiliary: No focal liver abnormality.No evidence of biliary obstruction or stone. Pancreas: Unremarkable. Spleen: Unremarkable. Adrenals/Urinary Tract: Negative adrenals. Bilateral percutaneous nephrostomy tube that is located. There is also a right-sided ureteral stent. No  hydronephrosis. Cystic density about the left kidney which could be parenchymal or subcapsular, up to 4.8 cm and stable. Indistinct high-density within the bladder, compatible with hemorrhage in this clinical setting. No superimposed stone is seen. No detectable bladder wall thickening to imply radiation cystitis. Stomach/Bowel: Formed stool throughout most of the colon. Negative for obstruction or visible bowel wall thickening. Vascular/Lymphatic: Asymmetric right lower extremity edema with common femoral vein mural thickening, suspect there is subacute or chronic DVT when correlated with previous imaging. Reproductive:History of cervical cancer with indistinct soft tissue planes in the pelvis correlating with radiotherapy. Fibroid is present on the right, measuring 3.5 cm. Other: No ascites or pneumoperitoneum. Indistinct retroperitoneal soft tissues without discrete adenopathy, stable. Musculoskeletal: Osteopenia.  No acute finding IMPRESSION: 1. High-density clot within the urinary bladder from uncertain source. 2. Bilateral percutaneous nephrostomy tubes with no hydronephrosis. Located right ureteral stent. 3. Suspect subacute or chronic femoral DVT on the right, please correlate with venous ultrasound. Electronically Signed   By: Monte Fantasia M.D.   On: 01/07/2019 04:43        Scheduled Meds: . bictegravir-emtricitabine-tenofovir AF  1 tablet Oral Daily  . clonazePAM  0.25 mg Oral BID  . methadone  5 mg Oral Q8H  . methocarbamol  500 mg Oral TID  . senna-docusate  2 tablet Oral BID  . sodium chloride flush  10-40 mL Intracatheter Q12H   Continuous Infusions: . sodium chloride       LOS: 1 day    Time spent: 35 mins.More than 50% of that time was spent in counseling and/or coordination of care.      Shelly Coss, MD [facility] Pager [phone]  If 7PM-7AM, please contact night-coverage [email or web address] Password [email or web address] 01/08/2019, 12:01 PM

## 2019-01-08 NOTE — Progress Notes (Addendum)
Manufacturing engineer (ACC) Hospice GIP RN Visit @ 9a  This is a related and covered GIP admission with a hospice diagnosis of cervical cancer per Dr. Tomasa Hosteller.  Pt called hospice last night with concerns of bleeding from her nephrostomy tubes and increased pain.  She requested to be evaluated at the ED.  She is admitted to the hospital with anemia and pain.  Visited pt at the bedside.  She is alone, she is restless and complaining of pain.  RN had just administered PRN dilaudid and zofran for nausea.  Spouse Misty Thomas arrived to offer support.  RN to order heating pad as well as administer PRN medications.   V/S:  100.5 oral, 116/79, HR 114, RR 16, SPO2 100% RA  I&O:  1657/1175 for previous 24 hours  Lab work:  Creat 1.4, GFR 48, hgb 8.3, RBC 2.83, HCT 26.9  IVs, infusions, PRNs:  NS @ 36ml/hr, dilaudid 9m IV x6,, zofran 4 mg IV x 1, phenergan 12.5 mg IV x 1  Problem List: Anemia:  Received 2 UPRBCs, hgb now 8.3 Pain:  Methadone 5mg  Q8H PO, dilaudid 0.5-1 mg IV Q2H PRN, dilaudid 2mg  PO Q3H DVT:  Known issue, decision was made not to treat; note from Dr. Alvy Bimler 2/11               Goals of Care:  Need clarifying, she rescinded her DNR status.  PMT was consulted in ED to help with GOCs and pain management.    IDT:  Updated  Discharge planning:  Unclear at this time.  Spouse has voiced her care is too much, her adult children are wanting to honor her wishes to care for her at home.  Her active problem remains pain. This was day 5 of her methadone, and her pain is not at a point that it can be managed at home.   Please call with any hospice related questions/concerns  Venia Carbon BSN, RN [facility] Liaison [phone]  **addendum 01/10/19 0830, this is a related admission, pt is self pay with hospice, [employer] will be her covering payor
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
## 2019-01-09 LAB — BASIC METABOLIC PANEL
Anion gap: 8 (ref 5–15)
BUN: 12 mg/dL (ref 6–20)
CO2: 23 mmol/L (ref 22–32)
CREATININE: 1.4 mg/dL — AB (ref 0.44–1.00)
Calcium: 8 mg/dL — ABNORMAL LOW (ref 8.9–10.3)
Chloride: 108 mmol/L (ref 98–111)
GFR calc Af Amer: 50 mL/min — ABNORMAL LOW (ref >60)
GFR calc non Af Amer: 43 mL/min — ABNORMAL LOW (ref >60)
Glucose, Bld: 89 mg/dL (ref 70–99)
Potassium: 3.9 mmol/L (ref 3.5–5.1)
Sodium: 139 mmol/L (ref 135–145)

## 2019-01-09 LAB — CBC WITH DIFFERENTIAL/PLATELET
Abs Immature Granulocytes: 0.14 10*3/uL — ABNORMAL HIGH (ref 0.00–0.07)
Basophils Absolute: 0 10*3/uL (ref 0.0–0.1)
Basophils Relative: 0 %
Eosinophils Absolute: 0.1 10*3/uL (ref 0.0–0.5)
Eosinophils Relative: 1 %
HCT: 27.1 % — ABNORMAL LOW (ref 36.0–46.0)
Hemoglobin: 8.1 g/dL — ABNORMAL LOW (ref 12.0–15.0)
Immature Granulocytes: 1 %
LYMPHS PCT: 24 %
Lymphs Abs: 2.6 10*3/uL (ref 0.7–4.0)
MCH: 29 pg (ref 26.0–34.0)
MCHC: 29.9 g/dL — ABNORMAL LOW (ref 30.0–36.0)
MCV: 97.1 fL (ref 80.0–100.0)
Monocytes Absolute: 0.7 10*3/uL (ref 0.1–1.0)
Monocytes Relative: 7 %
NEUTROS ABS: 7.4 10*3/uL (ref 1.7–7.7)
Neutrophils Relative %: 67 %
Platelets: 324 10*3/uL (ref 150–400)
RBC: 2.79 MIL/uL — ABNORMAL LOW (ref 3.87–5.11)
RDW: 17.3 % — AB (ref 11.5–15.5)
WBC: 11 10*3/uL — ABNORMAL HIGH (ref 4.0–10.5)
nRBC: 0 % (ref 0.0–0.2)

## 2019-01-09 MED ORDER — METOPROLOL TARTRATE 5 MG/5ML IV SOLN: 5.0000 mg | Freq: Three times a day (TID) | INTRAVENOUS | Status: DC | PRN

## 2019-01-09 NOTE — Progress Notes (Addendum)
   01/09/19 2131  MEWS Score  Resp 19  Pulse Rate (!) 112  MEWS Score  MEWS RR 0  MEWS Pulse 2  MEWS Systolic 1  MEWS LOC 0  MEWS Temp 0  MEWS Score 3  MEWS Score Color Yellow  MEWS Assessment  Is this an acute change? No   Pt in pain and rates it 9/10, visibly upset/crying. RN gave pt PRN dilaudid and PM meds to help calm pt and to alleviate some of her pain. RN assessed pt after VS were taken and she has calmed down a little. Her HR is still 112 and RR 20.  Will take another set of VS to reassess.

## 2019-01-09 NOTE — Progress Notes (Addendum)
PROGRESS NOTE    Misty Thomas  QBH:[id number] DOB: 1966/02/21 DOA: 01/07/2019 PCP: Patient, No Pcp Per   Brief Narrative:  Patient is a 53 year old unfortunate female with history of metastatic cervical cancer, right lower extremity DVT, bilateral percutaneous nephrostomy tube placement for hydronephrosis, type 2 diabetes mellitus, cancer-related pain who is currently being followed by hospice at home presents to the emergency department with complaints of bloody drainage in the PCN bag.  No mention of fever or chills at home.CT imaging done in the emergency department did not show any hydronephrosis or dislodgment of the tube.  ED physician discussed with urology who recommended no intervention.  Patient admitted for blood transfusion and pain management.  Palliative care has been consulted and following.  Is to discharge her home with hospice after improvement in the pain.  She remains full code.   Assessment & Plan:   Principal Problem:   Nephrostomy tube bleed ([place]) Active Problems:   Primary cervical cancer with metastasis - Stage IV   Cancer associated pain   Diabetes mellitus without complication (HCC)   Acquired hypothyroidism   Anemia, blood loss   HIV (human immunodeficiency virus infection) ([place])   Hydronephrosis   Goals of care, counseling/discussion   Acute blood loss anemia   DVT (deep venous thrombosis) (HCC)  Nephrostomy tube bleed/acute blood loss anemia: Presented with complaints of bleeding from the nephrostomy tube.  Her right-sided nephrostomy bag was filled with bloody urine.  Left-sided nephrostomy bag has clear urine. Hemoglobin in the range of 5 on presentation.  Transfused with 2 units of PRBC with improvement of hemoglobin in the range of 8 .Hb stable this morning. We will continue to monitor H&H.  Discussed with urology on presentation, urology did not recommend any change in the plan or any new intervention. FOBT was also found to be positive.  She denies  any change in the color of the stool.  Currently not sure how much help will it do regarding further work-up for the positive FOBT.  Cervical cancer stage IV: Follows with Dr. Alvy Bimler.  Not a candidate for chemotherapy.  She is being followed by hospice at home.  Currently she has cancer associated pain.  Continue current pain medications.  She was recently hospitalized and was discharged to home on hospice.  Pain was not controlled with MS Contin and she had been transitioned to methadone .  Palliative care has been consulted to discuss goals of care. Currently she is full code.  When she was discharged on 12/19/18 she was DNR.  CODE STATUS has been changed.  I have discussed with the patient at the bedside regarding her poor prognosis. We will discharge her Dilaudid oral and methadone when she is ready.  History of HIV: On Biktarvy which we will continue.  Right lower extremity DVT: Not a candidate for anticoagulation or IVC filter placement.  Has severe bilateral lower extremities more on the right.  Sinus tachycardia: Most likely associated with pain.  We will continue to monitor.  Acute kidney injury versus CKD: Renal function has actually improved.  Her creatinine when she was discharged on 2/12 was of 3.53.  Renal dysfunction associated with cervical cancer causing hydronephrosis and improvement has been seen after nephrostomy tube placement. CT imaging also showed High-density clot within the urinary bladder from uncertain source.  Urology did not recommend any further intervention.  Fever: Mild grade fever.  Will get blood culture and urine culture.  Will not to start on antibiotics for now  Deconditioning/debility: PT recommended skilled nursing facility.  Social worker consulted   Nutrition Problem: Increased nutrient needs Etiology: cancer and cancer related treatments, chronic illness, wound healing(Pressure Injury Stage II)      DVT prophylaxis: SCD Code Status: Full Family  Communication: None present at the bedside Disposition Plan: Home vs SNF after improvement in the pain   Consultants: None  Procedures: None  Antimicrobials:  Anti-infectives (From admission, onward)   Start     Dose/Rate Route Frequency Ordered Stop   01/07/19 1000  bictegravir-emtricitabine-tenofovir AF (BIKTARVY) 50-200-25 MG per tablet 1 tablet     1 tablet Oral Daily 01/07/19 0945        Subjective: Patient seen and examined the bedside this morning.  Hemodynamically stable, sinus tachycardia.  Pain better controlled than yesterday.  Complains of discomfort on bilateral nephrostomy tubes.  Objective: Vitals:   01/08/19 1446 01/08/19 2058 01/09/19 0614 01/09/19 0624  BP: 116/79 108/65 108/82   Pulse: (!) 114 (!) 105 (!) 115 (!) 103  Resp: 16 17 20    Temp: (!) 100.5 F (38.1 C) (!) 100.4 F (38 C) 99.4 F (37.4 C)   TempSrc: Oral     SpO2: 100% 98% 98%   Weight:      Height:        Intake/Output Summary (Last 24 hours) at 01/09/2019 1156 Last data filed at 01/09/2019 0716 Gross per 24 hour  Intake 510 ml  Output 250 ml  Net 260 ml   Filed Weights   01/07/19 0317  Weight: 72.6 kg    Examination: General exam: Chronically ill looking, generalized weakness  HEENT:PERRL,Oral mucosa moist, Ear/Nose normal on gross exam Respiratory system: Bilateral equal air entry, normal vesicular breath sounds, no wheezes or crackles  Cardiovascular system: Sinus tachycardia. No JVD, murmurs, rubs, gallops or clicks. Gastrointestinal system: Abdomen is nondistended, soft and nontender. No organomegaly or masses felt. Normal bowel sounds heard.Bilateral PCN tubes with bags.   Central nervous system: Alert and oriented. No focal neurological deficits. Extremities: Severe bilateral lower extremity edema, no clubbing ,no cyanosis, distal peripheral pulses palpable. Skin: No rashes, lesions or ulcers,no icterus ,no pallor   Data Reviewed: I have personally reviewed following labs and  imaging studies  CBC: Recent Labs  Lab 01/07/19 0353 01/08/19 0807 01/09/19 0515  WBC 9.5 10.8* 11.0*  NEUTROABS 6.5  --  7.4  HGB 5.3* 8.3* 8.1*  HCT 18.1* 26.9* 27.1*  MCV 100.0 95.1 97.1  PLT 301 312 527   Basic Metabolic Panel: Recent Labs  Lab 01/07/19 0353 01/08/19 0807 01/09/19 0515  NA 135 138 139  K 4.1 4.0 3.9  CL 107 108 108  CO2 25 22 23   GLUCOSE 86 82 89  BUN 12 11 12   CREATININE 1.30* 1.44* 1.40*  CALCIUM 7.8* 8.0* 8.0*   GFR: Estimated Creatinine Clearance: 42.8 mL/min (A) (by C-G formula based on SCr of 1.4 mg/dL (H)). Liver Function Tests: Recent Labs  Lab 01/07/19 0353 01/08/19 0807  AST 10* 11*  ALT 8 7  ALKPHOS 123 127*  BILITOT 1.1 0.9  PROT 6.6 6.7  ALBUMIN 2.3* 2.4*   No results for input(s): LIPASE, AMYLASE in the last 168 hours. No results for input(s): AMMONIA in the last 168 hours. Coagulation Profile: Recent Labs  Lab 01/07/19 0353  INR 1.1   Cardiac Enzymes: No results for input(s): CKTOTAL, CKMB, CKMBINDEX, TROPONINI in the last 168 hours. BNP (last 3 results) No results for input(s): PROBNP in the last 8760 hours. HbA1C:  No results for input(s): HGBA1C in the last 72 hours. CBG: No results for input(s): GLUCAP in the last 168 hours. Lipid Profile: No results for input(s): CHOL, HDL, LDLCALC, TRIG, CHOLHDL, LDLDIRECT in the last 72 hours. Thyroid Function Tests: No results for input(s): TSH, T4TOTAL, FREET4, T3FREE, THYROIDAB in the last 72 hours. Anemia Panel: No results for input(s): VITAMINB12, FOLATE, FERRITIN, TIBC, IRON, RETICCTPCT in the last 72 hours. Sepsis Labs: No results for input(s): PROCALCITON, LATICACIDVEN in the last 168 hours.  Recent Results (from the past 240 hour(s))  Culture, Urine     Status: None (Preliminary result)   Collection Time: 01/09/19  9:25 AM  Result Value Ref Range Status   Specimen Description   Final    URINE, CATHETERIZED Performed at [facility]  [address], [place], [place] [address]    Special Requests   Final    Immunocompromised Performed at [facility], [place] [address] [place],  [address]    Culture PENDING  Incomplete   Report Status PENDING  Incomplete         Radiology Studies: No results found.      Scheduled Meds: . bictegravir-emtricitabine-tenofovir AF  1 tablet Oral Daily  . clonazePAM  0.25 mg Oral BID  . feeding supplement (ENSURE ENLIVE)  237 mL Oral BID BM  . methadone  5 mg Oral Q8H  . methocarbamol  500 mg Oral TID  . mirtazapine  7.5 mg Oral QHS  . senna-docusate  2 tablet Oral BID  . sodium chloride flush  10-40 mL Intracatheter Q12H   Continuous Infusions: . sodium chloride       LOS: 2 days    Time spent: 25 mins.More than 50% of that time was spent in counseling and/or coordination of care.      Shelly Coss, MD [facility] Pager [phone]  If 7PM-7AM, please contact night-coverage [email or web address] Password [email or web address] 01/09/2019, 11:56 AM

## 2019-01-09 NOTE — Progress Notes (Addendum)
Manufacturing engineer [facility]) Hospice GIP RN Visit.   This is a related and covered GIP admission with a hospice diagnosis of cervical cancer per Dr. Tomasa Hosteller. Pt called hospice last night with concerns of bleeding from her nephrostomy tubes and increased pain. She requested to be evaluated at the ED. She is admitted to the hospital with anemia and pain.  Medication list and transfer summary on shadow chart.  Visited pt at the bedside along with Christena Deem, CSW for [facility]. Husband and daughter also present. Patient is reports pain is better controled today. She denies pain during visit. Discussion of care after discharge and goal of care.    VS: 98.2, 110/75, 121, 22, 98% RA I/O: 510/250 Labs: Creatinine 1.40, Calcium 8.0, GFR 50, WBC 11.0, RBC 2.79, Hgb 8.1, HCT 27.1, MCHC 29.9, RDW 17.3  Medications: Methadone 5mg  TID, Robaxin 500mg  TID, Remeron 7.5mg  QHS, Benadryl 25mg  PRN @ 1155, PRN Dilaudid 0.5mg  IV  @ 0237,0510,0906,1419, PRN Dilaudid 1mg  PO @0601 , 1155  Per PMT MD notes: Patient to continue PO Methadone, she is also on PO PRN Dilaudid.  Remeron low dose has been added, for mood and appetite stimulation.  Patient doesn't wish to engage or discuss about code status or overall goals of care.  Would recommend ongoing hospice support at home, to continue goals of care discussions.   Goals of Care: Discussed today with [facility] CSW. Patient wishes to remain a full code at this time.     IDT: Updated  Discharge planning: Patient wishes to return home when pain is managed. [facility] CSW is researching Medicaid status and eligibility for receiving personal care services. Currently Husband and Daughter are primary caregivers.  Please [name] for ambulance transport at time of discharge.  Please call with any hospice related questions/concerns  Farrel Gordon, RN, [facility] [place] [phone]  **addendum 01/10/19 0830, this is a related admission, pt is self pay with  hospice, BC[employer] will be her covering payor**

## 2019-01-09 NOTE — Progress Notes (Signed)
PMT progress note  Patient is sitting up in bed. She is awake, alert.  She states that at present, her pain is reasonably well controlled. She doesn't engage much.  There is no family in the room.  She is asking about when she will be able to go home.  BP 108/82 (BP Location: Left Arm)   Pulse (!) 103   Temp 99.4 F (37.4 C)   Resp 20   Ht 5\' 1"  (1.549 m)   Wt 72.6 kg   LMP 02/03/2018   SpO2 98%   BMI 30.23 kg/m  Labs and imaging noted Med history noted Discussed with [facility] MD.   No distress Appears chronically ill Has edema Regular work of breathing Abdomen is some what distended S1 S 2 Appears with generalized weakness  PPS 30%  Metastatic cervical cancer RLE DVT Recent hydropenhrosis s/p bilat perc nephrostomy tube placement She has underlying DM Cancer related pain.  Currently enrolled in home hospice.   Patient to continue PO Methadone, she is also on PO PRN Dilaudid.  Remeron low dose has been added, for mood and appetite stimulation.  Patient doesn't wish to engage or discuss about code status or overall goals of care.  Would recommend ongoing hospice support at home, to continue goals of care discussions.  25 minutes spent Loistine Chance MD [facility] health palliative medicine team [phone]

## 2019-01-09 NOTE — Progress Notes (Signed)
Manufacturing engineer Social Work Visit Rm [id number]. ~3:35pm start time for visit.  ADDENDUM: 04:37pm. LCSW left voice mail with hospital SW, [place], re: proper pain management/anticipating Pt discharging soon to home/will need EMS transport home. LCSW encouraged follow up with LCSW as needed.  SUMMARY/INTERVENTIONS: LCSW completed routine visit in Pt hospital room with hospital RN liaison, Bevely Palmer. Pt was present and asleep upon arrival/denied pain when asked originally though did state back pain that resolved during visit. Pt appeared much more comfortable than last visit though she did have periods of grimacing. Pt husband, [name], was present and visit started in discussion of Pt insurance after call from finance department at the hospital. Bevely Palmer stepped out during this portion of visit and returned soon after. LCSW verified that Pt has Medicaid and BCBS listed/does not have Medicare. LCSW educated Pt husband on eligibility differences/started conversation about Pt care at home or in facility if she has full Medicaid. Bevely Palmer returned during this portion of conversation/conversation held regarding aide support that can occur if she has full Medicaid/LCSW emailed case manager, Manuela Schwartz, with [facility] during this visit to inquire about Medicaid type. Pt daughter, April, presented with her significant other and two of their children. April stayed while the other three of her family members left soon after. LCSW updated April on conversation. Conversation held re: Pt pain/new pain management method with use of Dilaudid. Pt also now taking Remeron to help with mood/appetite which Bevely Palmer noted to family. Conversation held re: care goal location/it was agreed for Pt to return home after the hospital. It was agreed upon that keeping Pt pain managed is important to family. LCSW inquired about Pt care at home and she is left alone for brief periods/ does not feel comfortable with her own medication  administration. Primary RN will assist with education upon Pt return home. LCSW provided education on follow up visit time frame for RN and LCSW post discharge. Bevely Palmer anticipates that this will happen soon based on proper pain management now. Understanding expressed. LCSW discussed Pt advance directives and Pt confirms wanting to be full code/education provided on this. Pt uncertain about being on life support. LCSW also provided education on DNR form in terms of benefit of having it in the home to use if needed. LCSW inquired about POA/HCPOA and Pt does not have either one though she states that her husband/brother are POA. LCSW also provided education on HCPOA/ability to assist with providing forms if desired in the future. LCSW also noted that Pt husband will be legal next of kin/health care decision maker for Pt in event that she cannot speak for herself. LCSW also provided brief education on various forms of pain including emotional/spiritual that can manifest physically and educated on IDT roles to address these concerns. Pt and family appeared agreeable/receptive to this direction/Pt agreed that these types of pain could be present for her. LCSW agreed to alert RN and Education officer, museum of update re: visit/confirm that Pt will be taking EMS home as agreed upon today. LCSW and Latanya Presser provided report to hospital RN, Hinton Dyer, re: Pt effective pain management/confirmed lack of discharge plan but anticipated soon. Pt husband approached LCSW and RN post visit near nurses station and inquired about LCSW helping with POA for Pt other family member. LCSW denied noting helping with this/noted that LCSW can assist with HCPOA if Pt desires. Pt husband verified presence of non-physical pain, states effect on him of this situation. LCSW provided education on grief/encouraged his self  care as he notes using this time to rest while Pt is hospitalized. LCSW validated care of Pt/agreed to check in on Monday.  Please call us  with any questions or concerns.  Christena Deem, LCSW Social Training and development officer [phone]

## 2019-01-10 ENCOUNTER — Inpatient Hospital Stay (HOSPITAL_COMMUNITY): Payer: Medicaid Other

## 2019-01-10 ENCOUNTER — Encounter (HOSPITAL_COMMUNITY): Payer: Self-pay | Admitting: Diagnostic Radiology

## 2019-01-10 HISTORY — PX: IR NEPHROSTOMY EXCHANGE LEFT: IMG6069

## 2019-01-10 HISTORY — PX: IR NEPHROSTOMY EXCHANGE RIGHT: IMG6070

## 2019-01-10 LAB — BASIC METABOLIC PANEL
Anion gap: 8 (ref 5–15)
BUN: 13 mg/dL (ref 6–20)
CO2: 23 mmol/L (ref 22–32)
Calcium: 8.1 mg/dL — ABNORMAL LOW (ref 8.9–10.3)
Chloride: 107 mmol/L (ref 98–111)
Creatinine, Ser: 1.45 mg/dL — ABNORMAL HIGH (ref 0.44–1.00)
GFR calc Af Amer: 48 mL/min — ABNORMAL LOW (ref >60)
GFR calc non Af Amer: 41 mL/min — ABNORMAL LOW (ref >60)
Glucose, Bld: 76 mg/dL (ref 70–99)
Potassium: 3.8 mmol/L (ref 3.5–5.1)
Sodium: 138 mmol/L (ref 135–145)

## 2019-01-10 LAB — URINE CULTURE: Culture: <10000 — AB

## 2019-01-10 LAB — CBC WITH DIFFERENTIAL/PLATELET
ABS IMMATURE GRANULOCYTES: 0.18 10*3/uL — AB (ref 0.00–0.07)
Basophils Absolute: 0 10*3/uL (ref 0.0–0.1)
Basophils Relative: 0 %
EOS PCT: 1 %
Eosinophils Absolute: 0.1 10*3/uL (ref 0.0–0.5)
HCT: 27 % — ABNORMAL LOW (ref 36.0–46.0)
HEMOGLOBIN: 7.9 g/dL — AB (ref 12.0–15.0)
Immature Granulocytes: 2 %
Lymphocytes Relative: 21 %
Lymphs Abs: 2.5 10*3/uL (ref 0.7–4.0)
MCH: 28.8 pg (ref 26.0–34.0)
MCHC: 29.3 g/dL — ABNORMAL LOW (ref 30.0–36.0)
MCV: 98.5 fL (ref 80.0–100.0)
Monocytes Absolute: 0.8 10*3/uL (ref 0.1–1.0)
Monocytes Relative: 7 %
Neutro Abs: 8 10*3/uL — ABNORMAL HIGH (ref 1.7–7.7)
Neutrophils Relative %: 69 %
Platelets: 374 10*3/uL (ref 150–400)
RBC: 2.74 MIL/uL — ABNORMAL LOW (ref 3.87–5.11)
RDW: 17.1 % — ABNORMAL HIGH (ref 11.5–15.5)
WBC: 11.5 10*3/uL — ABNORMAL HIGH (ref 4.0–10.5)
nRBC: 0 % (ref 0.0–0.2)

## 2019-01-10 LAB — MAGNESIUM: Magnesium: 1.6 mg/dL — ABNORMAL LOW (ref 1.7–2.4)

## 2019-01-10 MED ORDER — FENTANYL CITRATE (PF) 100 MCG/2ML IJ SOLN
INTRAMUSCULAR | Status: AC
  Administered 2019-01-10: 50 ug via INTRAVENOUS
  Filled 2019-01-10: qty 2

## 2019-01-10 MED ORDER — LIDOCAINE HCL 1 % IJ SOLN
INTRAMUSCULAR | Status: AC
  Filled 2019-01-10: qty 20

## 2019-01-10 MED ORDER — IOHEXOL 300 MG/ML  SOLN
50.0000 mL | Freq: Once | INTRAMUSCULAR | Status: AC | PRN
  Administered 2019-01-10: 15 mL

## 2019-01-10 MED ORDER — MAGNESIUM SULFATE 2 GM/50ML IV SOLN
2.0000 g | Freq: Once | INTRAVENOUS | Status: AC
  Administered 2019-01-10: 2 g via INTRAVENOUS
  Filled 2019-01-10: qty 50

## 2019-01-10 MED ORDER — FENTANYL CITRATE (PF) 100 MCG/2ML IJ SOLN
50.0000 ug | Freq: Once | INTRAMUSCULAR | Status: AC
  Administered 2019-01-10: 50 ug via INTRAVENOUS

## 2019-01-10 NOTE — Progress Notes (Addendum)
Referring Physician(s): Shelly Coss  Supervising Physician: Markus Daft  Patient Status:  [facility] - In-pt  Chief Complaint: "Bleeding tube (right)"  Subjective:  Bilateral hydronephrosis secondary to cervical carcinoma s/p bilateral nephrostomy tube placement 11/05/2019 by Dr. Vernard Gambles (exchanged bilaterally 12/10/2018 by Dr. Earleen Newport). Patient awake and alert laying in bed. Bilateral nephrostomy tube sites c/d/i with gravity bags intact.   Allergies: Patient has no known allergies.  Medications: Prior to Admission medications   Medication Sig Start Date End Date Taking? Authorizing Provider  acetaminophen (TYLENOL) 500 MG tablet Take 500 mg by mouth every 6 (six) hours as needed for mild pain.   Yes [provider]  BIKTARVY 50-200-25 MG TABS tablet TAKE 1 TABLET BY MOUTH DAILY. Patient taking differently: Take 1 tablet by mouth daily.  09/14/18  Yes Comer, Okey Regal, MD  magnesium oxide (MAG-OX) 400 (241.3 Mg) MG tablet Take 1 tablet (400 mg total) by mouth 2 (two) times daily. 03/08/18  Yes Gorsuch, Ni, MD  methadone (DOLOPHINE) 5 MG tablet Take 5 mg by mouth 3 (three) times daily.   Yes [provider]  Oxycodone HCl 10 MG TABS Take 10-20 mg by mouth every 4 (four) hours as needed (pain).   Yes [provider]  lidocaine-prilocaine (EMLA) cream Apply 1 application topically as needed. 11/15/18   [name], MD  morphine (MS CONTIN) 30 MG 12 hr tablet Take 1 tablet (30 mg total) by mouth every 12 (twelve) hours. Patient not taking: Reported on 01/07/2019 12/19/18   Nita Sells, MD  morphine (MSIR) 30 MG tablet Take 1 tablet (30 mg total) by mouth every 6 (six) hours as needed for severe pain. Patient not taking: Reported on 01/07/2019 12/28/18   [name], MD  ondansetron (ZOFRAN) 8 MG tablet Take 1 tablet (8 mg total) by mouth every 8 (eight) hours as needed. Patient not taking: Reported on 01/07/2019 12/06/18   [name], MD     Vital Signs: BP  118/84 (BP Location: Left Arm)   Pulse (!) 124   Temp 99.1 F (37.3 C)   Resp 18   Ht 5\' 1"  (1.549 m)   Wt 160 lb (72.6 kg)   LMP 02/03/2018   SpO2 98%   BMI 30.23 kg/m   Physical Exam Vitals signs and nursing note reviewed.  Constitutional:      General: She is not in acute distress.    Appearance: Normal appearance.  Pulmonary:     Effort: Pulmonary effort is normal. No respiratory distress.  Skin:    General: Skin is warm and dry.     Comments: Left nephrostomy tube site without tenderness, erythema, or drainage/bleeding; gravity bag intact with approximately 50 mL clear yellow urine. Right nephrostomy tube site with mild tenderness and a small amount of old dried blood around site, no signs of active drainage/bleeding or erythema; gravity bag intact with minimal output of blood-tinged urine.  Neurological:     Mental Status: She is alert and oriented to person, place, and time.  Psychiatric:        Mood and Affect: Mood normal.        Behavior: Behavior normal.        Thought Content: Thought content normal.        Judgment: Judgment normal.     Imaging: Ct Renal Stone Study  Result Date: 01/07/2019 CLINICAL DATA:  Bilateral nephrostomy tube with pain and bleeding on the right. EXAM: CT ABDOMEN AND PELVIS WITHOUT CONTRAST TECHNIQUE: Multidetector CT  imaging of the abdomen and pelvis was performed following the standard protocol without IV contrast. COMPARISON:  12/15/2018 FINDINGS: Lower chest:  No contributory findings. Hepatobiliary: No focal liver abnormality.No evidence of biliary obstruction or stone. Pancreas: Unremarkable. Spleen: Unremarkable. Adrenals/Urinary Tract: Negative adrenals. Bilateral percutaneous nephrostomy tube that is located. There is also a right-sided ureteral stent. No hydronephrosis. Cystic density about the left kidney which could be parenchymal or subcapsular, up to 4.8 cm and stable. Indistinct high-density within the bladder, compatible with  hemorrhage in this clinical setting. No superimposed stone is seen. No detectable bladder wall thickening to imply radiation cystitis. Stomach/Bowel: Formed stool throughout most of the colon. Negative for obstruction or visible bowel wall thickening. Vascular/Lymphatic: Asymmetric right lower extremity edema with common femoral vein mural thickening, suspect there is subacute or chronic DVT when correlated with previous imaging. Reproductive:History of cervical cancer with indistinct soft tissue planes in the pelvis correlating with radiotherapy. Fibroid is present on the right, measuring 3.5 cm. Other: No ascites or pneumoperitoneum. Indistinct retroperitoneal soft tissues without discrete adenopathy, stable. Musculoskeletal: Osteopenia.  No acute finding IMPRESSION: 1. High-density clot within the urinary bladder from uncertain source. 2. Bilateral percutaneous nephrostomy tubes with no hydronephrosis. Located right ureteral stent. 3. Suspect subacute or chronic femoral DVT on the right, please correlate with venous ultrasound. Electronically Signed   By: Monte Fantasia M.D.   On: 01/07/2019 04:43    Labs:  CBC: Recent Labs    01/07/19 0353 01/08/19 0807 01/09/19 0515 01/10/19 0319  WBC 9.5 10.8* 11.0* 11.5*  HGB 5.3* 8.3* 8.1* 7.9*  HCT 18.1* 26.9* 27.1* 27.0*  PLT 301 312 324 374    COAGS: Recent Labs    11/03/18 0425 11/04/18 0449 12/16/18 0346 01/07/19 0353  INR 1.46 1.42 1.42 1.1    BMP: Recent Labs    01/07/19 0353 01/08/19 0807 01/09/19 0515 01/10/19 0319  NA 135 138 139 138  K 4.1 4.0 3.9 3.8  CL 107 108 108 107  CO2 25 22 23 23   GLUCOSE 86 82 89 76  BUN 12 11 12 13   CALCIUM 7.8* 8.0* 8.0* 8.1*  CREATININE 1.30* 1.44* 1.40* 1.45*  GFRNONAA 47* 42* 43* 41*  GFRAA 55* 48* 50* 48*    LIVER FUNCTION TESTS: Recent Labs    12/17/18 0340 12/19/18 0500 01/07/19 0353 01/08/19 0807  BILITOT 2.3* 1.4* 1.1 0.9  AST 14* 13* 10* 11*  ALT 20 17 8 7   ALKPHOS 106  102 123 127*  PROT 5.4* 5.7* 6.6 6.7  ALBUMIN 1.7* 1.8* 2.3* 2.4*    Assessment and Plan:  Bilateral hydronephrosis secondary to cervical carcinoma s/p bilateral nephrostomy tube placement 11/05/2019 by Dr. Vernard Gambles (exchanged bilaterally 12/10/2018 by Dr. Earleen Newport). Bilateral nephrostomy tubes stable and functioning properly. No active signs of bleeding or infection. Tube sites to be redressed by IR. Please call IR with questions/concerns.   Electronically Signed: Earley Abide, PA-C 01/10/2019, 2:15 PM   I spent a total of 25 Minutes at the the patient's bedside AND on the patient's hospital floor or unit, greater than 50% of which was counseling/coordinating care for nephrostomy tube malfunction.

## 2019-01-10 NOTE — Progress Notes (Signed)
PMT progress note  Patient is sitting up in bed. She is awake, alert.  She states that at present, her pain is reasonably well controlled. She doesn't engage much.  This morning, her husband is at the bedside in the room.  She is to be discharged home with hospice support within the next 24 hours or so.  She did not rest well overnight. She hasn't had breakfast, she has minimal appetite.   BP 111/74 (BP Location: Left Arm)   Pulse (!) 113   Temp 98.7 F (37.1 C) (Oral)   Resp 19   Ht 5\' 1"  (1.549 m)   Wt 72.6 kg   LMP 02/03/2018   SpO2 99%   BMI 30.23 kg/m  Labs and imaging noted Med history noted: total opioid medication use noted.  Discussed with TRH MD on 01-09-2019.   Chart reviewed, hospice liaisons and LCSW following closely.   mild distress Appears chronically ill Has edema Regular work of breathing Abdomen is some what distended S1 S 2 Appears with generalized weakness  PPS 30%  Metastatic cervical cancer RLE DVT Recent hydropenhrosis s/p bilat perc nephrostomy tube placement She has underlying DM Cancer related pain.  Currently enrolled in home hospice.   Patient to continue PO Methadone, she is also on PO PRN Dilaudid.  Remeron low dose has been added, for mood and appetite stimulation.  Attempted to re discuss with husband as well as the patient  about code status and overall goals of care, the patient doesn't reply, her husband simply states, "we know that."  Patient has "total pain", she has more than physical component of pain, most of her distress is existential in nature, she and her husband are aware of the serious incurable and irreversible nature of her illness.  Will request chaplain follow up as an extra layer of support.   I have discussed with patient and her husband about her current pain and non pain symptom management regimen.   Would recommend ongoing hospice support at home, to continue goals of care discussions.  35 minutes spent Loistine Chance MD [facility] palliative medicine team [phone]

## 2019-01-10 NOTE — Progress Notes (Addendum)
PROGRESS NOTE    Misty Thomas  OBS:[id number] DOB: 10-08-1966 DOA: 01/07/2019 PCP: Patient, No Pcp Per   Brief Narrative:  Patient is a 53 year old unfortunate female with history of metastatic cervical cancer, right lower extremity DVT, bilateral percutaneous nephrostomy tube placement for hydronephrosis, type 2 diabetes mellitus, cancer-related pain who is currently being followed by hospice at home presents to the emergency department with complaints of bloody drainage in the PCN bag.  No mention of fever or chills at home.CT imaging done in the emergency department did not show any hydronephrosis or dislodgment of the tube.  ED physician discussed with urology who recommended no intervention.  Patient admitted for blood transfusion and pain management.  Palliative care has been consulted and following.  Plan is to discharge her home with hospice after improvement in the pain.  She remains full code. IR requested to evaluate the nephrostomy tubes today.  Plan for discharge to home tomorrow   Assessment & Plan:   Principal Problem:   Nephrostomy tube bleed ([place]) Active Problems:   Primary cervical cancer with metastasis - Stage IV   Cancer associated pain   Diabetes mellitus without complication (HCC)   Acquired hypothyroidism   Anemia, blood loss   HIV (human immunodeficiency virus infection) ([place])   Hydronephrosis   Goals of care, counseling/discussion   Acute blood loss anemia   DVT (deep venous thrombosis) (HCC)  Nephrostomy tube bleed/acute blood loss anemia: Presented with complaints of bleeding from the nephrostomy tube.  Her right-sided nephrostomy bag was filled with bloody urine.  Left-sided nephrostomy bag has clear urine. Hemoglobin in the range of 5 on presentation.  Transfused with 2 units of PRBC with improvement of hemoglobin in the range of 8 .Hb stable this morning. We will continue to monitor H&H.  Discussed with urology on presentation, urology did not recommend  any change in the plan or any new intervention. FOBT was also found to be positive.  She denies any change in the color of the stool.  Currently not sure how much help will it do regarding further work-up for the positive FOBT. We have requested IR to look at the nephrostomy tubes due to continuous bleeding on the left nephrostomy bag. Also discussed with urology, Dr. Junious Silk, regarding her current situation.  He says there is nothing we can do at the moment and he arranged follow-up as an outpatient.  Cervical cancer stage IV: Follows with Dr. Alvy Bimler.  Not a candidate for chemotherapy.  She is being followed by hospice at home.  Currently she has cancer associated pain.  Continue current pain medications.  She was recently hospitalized and was discharged to home on hospice.  Pain was not controlled with MS Contin and she had been transitioned to methadone .  Palliative care has been consulted to discuss goals of care. Currently she is full code.  When she was discharged on 12/19/18 she was DNR.  CODE STATUS has been changed.  I have discussed with the patient at the bedside regarding her poor prognosis. We will discharge her Dilaudid oral and methadone when she is ready.  History of HIV: On Biktarvy which we will continue.  Right lower extremity DVT: Not a candidate for anticoagulation or IVC filter placement.  Has severe bilateral lower extremities more on the right.  Sinus tachycardia: Most likely associated with pain.  We will continue to monitor.  Acute kidney injury versus CKD: Renal function has actually improved.  Her creatinine when she was discharged on 2/12  was of 3.53.  Renal dysfunction associated with cervical cancer causing hydronephrosis and improvement has been seen after nephrostomy tube placement. CT imaging also showed High-density clot within the urinary bladder from uncertain source.  Urology did not recommend any further intervention.  Fever: Mild grade fever.  F/U  blood  culture and urine culture.  Will not to start on antibiotics for now  Deconditioning/debility: PT recommended skilled nursing facility.  Social worker consulted.Plan is to discharge home with hospice.   Nutrition Problem: Increased nutrient needs Etiology: cancer and cancer related treatments, chronic illness, wound healing(Pressure Injury Stage II)      DVT prophylaxis: SCD Code Status: Full Family Communication: Called husband on phone, phone not received Disposition Plan: Home tomorrow with hospice  Consultants: None  Procedures: None  Antimicrobials:  Anti-infectives (From admission, onward)   Start     Dose/Rate Route Frequency Ordered Stop   01/07/19 1000  bictegravir-emtricitabine-tenofovir AF (BIKTARVY) 50-200-25 MG per tablet 1 tablet     1 tablet Oral Daily 01/07/19 0945        Subjective: Patient seen and examined the bedside this morning.  Pain better than yesterday.  Still has significant amount of bloody urine in the right nephrostomy bag.  IR consulted today. Objective: Vitals:   01/09/19 2109 01/09/19 2131 01/10/19 0436 01/10/19 1315  BP: 95/80  111/74 118/84  Pulse: (!) 116 (!) 112 (!) 113 (!) 124  Resp: (!) 24 19 19 18   Temp: 98.1 F (36.7 C)  98.7 F (37.1 C) 99.1 F (37.3 C)  TempSrc: Oral  Oral   SpO2: 100%  99% 98%  Weight:      Height:        Intake/Output Summary (Last 24 hours) at 01/10/2019 1428 Last data filed at 01/10/2019 1130 Gross per 24 hour  Intake 240 ml  Output 1150 ml  Net -910 ml   Filed Weights   01/07/19 0317  Weight: 72.6 kg    Examination: General exam: Chronically ill looking, generalized weakness  HEENT:PERRL,Oral mucosa moist, Ear/Nose normal on gross exam Respiratory system: Bilateral equal air entry, normal vesicular breath sounds, no wheezes or crackles  Cardiovascular system: Sinus tachycardia. No JVD, murmurs, rubs, gallops or clicks. Gastrointestinal system: Abdomen is nondistended, soft and nontender. No  organomegaly or masses felt. Normal bowel sounds heard.Bilateral PCN tubes with bags.  Right back filled with bloody urine. Central nervous system: Alert and oriented. No focal neurological deficits. Extremities: Severe bilateral lower extremity edema, no clubbing ,no cyanosis, distal peripheral pulses palpable. Skin: No rashes, lesions or ulcers,no icterus ,no pallor   Data Reviewed: I have personally reviewed following labs and imaging studies  CBC: Recent Labs  Lab 01/07/19 0353 01/08/19 0807 01/09/19 0515 01/10/19 0319  WBC 9.5 10.8* 11.0* 11.5*  NEUTROABS 6.5  --  7.4 8.0*  HGB 5.3* 8.3* 8.1* 7.9*  HCT 18.1* 26.9* 27.1* 27.0*  MCV 100.0 95.1 97.1 98.5  PLT 301 312 324 161   Basic Metabolic Panel: Recent Labs  Lab 01/07/19 0353 01/08/19 0807 01/09/19 0515 01/10/19 0319  NA 135 138 139 138  K 4.1 4.0 3.9 3.8  CL 107 108 108 107  CO2 25 22 23 23   GLUCOSE 86 82 89 76  BUN 12 11 12 13   CREATININE 1.30* 1.44* 1.40* 1.45*  CALCIUM 7.8* 8.0* 8.0* 8.1*  MG  --   --   --  1.6*   GFR: Estimated Creatinine Clearance: 41.3 mL/min (A) (by C-G formula based on SCr of  1.45 mg/dL (H)). Liver Function Tests: Recent Labs  Lab 01/07/19 0353 01/08/19 0807  AST 10* 11*  ALT 8 7  ALKPHOS 123 127*  BILITOT 1.1 0.9  PROT 6.6 6.7  ALBUMIN 2.3* 2.4*   No results for input(s): LIPASE, AMYLASE in the last 168 hours. No results for input(s): AMMONIA in the last 168 hours. Coagulation Profile: Recent Labs  Lab 01/07/19 0353  INR 1.1   Cardiac Enzymes: No results for input(s): CKTOTAL, CKMB, CKMBINDEX, TROPONINI in the last 168 hours. BNP (last 3 results) No results for input(s): PROBNP in the last 8760 hours. HbA1C: No results for input(s): HGBA1C in the last 72 hours. CBG: No results for input(s): GLUCAP in the last 168 hours. Lipid Profile: No results for input(s): CHOL, HDL, LDLCALC, TRIG, CHOLHDL, LDLDIRECT in the last 72 hours. Thyroid Function Tests: No results for  input(s): TSH, T4TOTAL, FREET4, T3FREE, THYROIDAB in the last 72 hours. Anemia Panel: No results for input(s): VITAMINB12, FOLATE, FERRITIN, TIBC, IRON, RETICCTPCT in the last 72 hours. Sepsis Labs: No results for input(s): PROCALCITON, LATICACIDVEN in the last 168 hours.  Recent Results (from the past 240 hour(s))  Culture, blood (routine x 2)     Status: None (Preliminary result)   Collection Time: 01/09/19  8:58 AM  Result Value Ref Range Status   Specimen Description   Final    BLOOD LEFT ARM Performed at [facility] [address], [place], [place] [address]    Special Requests   Final    BOTTLES DRAWN AEROBIC AND ANAEROBIC Blood Culture adequate volume Performed at [facility] [address] [place], [place] [address]    Culture   Final    NO GROWTH 1 DAY Performed at [facility], [place] [address] [place], [place] [address]    Report Status PENDING  Incomplete  Culture, blood (routine x 2)     Status: None (Preliminary result)   Collection Time: 01/09/19  8:58 AM  Result Value Ref Range Status   Specimen Description   Final    BLOOD RIGHT ARM Performed at [facility] [address] [place], [place] [address]    Special Requests   Final    BOTTLES DRAWN AEROBIC AND ANAEROBIC Blood Culture adequate volume Performed at [facility] [address], [place], [place] [address]    Culture   Final    NO GROWTH 1 DAY Performed at [facility], [place] [address], [place], [place] [address]    Report Status PENDING  Incomplete  Culture, Urine     Status: Abnormal   Collection Time: 01/09/19  9:25 AM  Result Value Ref Range Status   Specimen Description   Final    URINE, CATHETERIZED Performed at [facility] [address] [place], [place] [address]    Special Requests Immunocompromised  Final   Culture (A)  Final    <10,000 COLONIES/mL INSIGNIFICANT  GROWTH Performed at [facility], [place] [address], [place], [place] [address]    Report Status 01/10/2019 FINAL  Final         Radiology Studies: No results found.      Scheduled Meds: . bictegravir-emtricitabine-tenofovir AF  1 tablet Oral Daily  . clonazePAM  0.25 mg Oral BID  . feeding supplement (ENSURE ENLIVE)  237 mL Oral BID BM  . methadone  5 mg Oral Q8H  . methocarbamol  500 mg Oral TID  .  mirtazapine  7.5 mg Oral QHS  . senna-docusate  2 tablet Oral BID  . sodium chloride flush  10-40 mL Intracatheter Q12H   Continuous Infusions: . sodium chloride       LOS: 3 days    Time spent: 25 mins.More than 50% of that time was spent in counseling and/or coordination of care.      Shelly Coss, MD [facility] Pager [phone]  If 7PM-7AM, please contact night-coverage [email or web address] Password [email or web address] 01/10/2019, 2:28 PM

## 2019-01-10 NOTE — Progress Notes (Signed)
Physical Therapy Treatment Patient Details Name: Misty Thomas MRN: [id number] DOB: 16-Jun-1966 Today's Date: 01/10/2019    History of Present Illness 53 yo female admitted with nephrostomy tube bleeding, weakness, cancer related pain. Hx of HIV, cervical ca with mets, DVT, DM, bilater PCN placement    PT Comments    Patient seen for mobility progression. Able to perform OOB transfers today with Mod A +2 with HHA. Patient requiring cueing for motivation as well as safety/sequencing throughout for efficiency. PT to continue to follow.  Patient returning to room 15:45 - 16:03 per nursing request to assist with transfers back to BSC/bed. Continues to require Mod A +2 with HHA.     Follow Up Recommendations  SNF;Supervision/Assistance - 24 hour(vs home with hospice)     Equipment Recommendations  Rolling walker with 5" wheels;Wheelchair (measurements PT);Wheelchair cushion (measurements PT);3in1 (PT);Hospital bed    Recommendations for Other Services       Precautions / Restrictions Precautions Precautions: Fall Precaution Comments: 2 drains (L and R back) Restrictions Weight Bearing Restrictions: No    Mobility  Bed Mobility Overal bed mobility: Needs Assistance Bed Mobility: Sidelying to Sit   Sidelying to sit: Min assist       General bed mobility comments: cueing for sequencing; patient pulling up from PT hand  Transfers Overall transfer level: Needs assistance Equipment used: 2 person hand held assist Transfers: Sit to/from [facility] Sit to Stand: Mod assist;+2 physical assistance Stand pivot transfers: Mod assist;+2 physical assistance       General transfer comment: +2 HHA - patient very fearful of falling; cueing for safety and sequencing  Ambulation/Gait             General Gait Details: able to take small shuffle steps towards [facility] and recliner   Stairs             Wheelchair Mobility    Modified Rankin (Stroke Patients  Only)       Balance Overall balance assessment: Needs assistance Sitting-balance support: Feet supported;Bilateral upper extremity supported Sitting balance-Leahy Scale: Good     Standing balance support: Bilateral upper extremity supported;During functional activity Standing balance-Leahy Scale: Poor                              Cognition Arousal/Alertness: Awake/alert Behavior During Therapy: WFL for tasks assessed/performed Overall Cognitive Status: Within Functional Limits for tasks assessed                                        Exercises      General Comments        Pertinent Vitals/Pain Pain Assessment: Faces Faces Pain Scale: Hurts even more Pain Location: generalized Pain Descriptors / Indicators: Crying;Grimacing;Discomfort;Restless Pain Intervention(s): Limited activity within patient's tolerance;Monitored during session;Repositioned    Home Living                      Prior Function            PT Goals (current goals can now be found in the care plan section) Acute Rehab PT Goals Patient Stated Goal: less pain PT Goal Formulation: With patient Time For Goal Achievement: 01/22/19 Potential to Achieve Goals: Fair Progress towards PT goals: Progressing toward goals    Frequency    Min 2X/week  PT Plan Current plan remains appropriate    Co-evaluation              AM-PAC PT "6 Clicks" Mobility   Outcome Measure  Help needed turning from your back to your side while in a flat bed without using bedrails?: A Little Help needed moving from lying on your back to sitting on the side of a flat bed without using bedrails?: A Little Help needed moving to and from a bed to a chair (including a wheelchair)?: A Lot Help needed standing up from a chair using your arms (e.g., wheelchair or bedside chair)?: A Lot Help needed to walk in hospital room?: A Lot Help needed climbing 3-5 steps with a railing? :  Total 6 Click Score: 13    End of Session Equipment Utilized During Treatment: Gait belt Activity Tolerance: Patient limited by pain;Patient limited by fatigue Patient left: in chair;with call bell/phone within reach;with chair alarm set Nurse Communication: Mobility status PT Visit Diagnosis: Pain;Muscle weakness (generalized) (M62.81);Difficulty in walking, not elsewhere classified (R26.2)     Time: 0962-8366 PT Time Calculation (min) (ACUTE ONLY): 28 min  Charges:  $Therapeutic Activity: 23-37 mins                     Lanney Gins, PT, DPT Supplemental Physical Therapist 01/10/19 4:04 PM Pager: [phone] Office: [phone]

## 2019-01-10 NOTE — Procedures (Signed)
Interventional Radiology Procedure:   Indications: Bleeding from right nephrostomy tube and both tubes are mildly retracted  Procedure: Bilateral nephrostomy tube exchange  Findings: New left 10 Fr nephrostomy in renal pelvis.  New 10 Fr Bettey Mare placed on right due to ureter stent and small collecting system.  Complications: none     EBL: None  Plan: Both tubes to gravity drain.  Will need right ureter stent removed by Urology.     Misty Thomas R. Anselm Pancoast, MD  Pager: [phone]

## 2019-01-10 NOTE — Consult Note (Signed)
.. Urology Consult   Physician requesting consult: Shelly Coss  Reason for consult: Hematuria   History of Present Illness: Misty Thomas is a 53 y.o. female with PMH significant for cervical cancer on hospice, anemia, RLE DVT, and HIV who presented to the ED on 01/07/19 with c/o blood in her right nephrostomy bag. She has bilateral nephrostomy tubes due to hydronephrosis caused by her cancer.  She also has a right ureteral stent that was placed by Dr. Louis Meckel.  With questioning she states that the right nephrostomy tube/bag was accidentally pulled on approx 3-4 days ago and the bloody urine started just after that. CT performed in the ED revealed no hydro or displacement of the neph tubes.  She denies recent F/C/N/V and pain around neph tubes.  She was found to be anemic and has been transfused by IM.    Since admission the urine in the right bag has been intermittently clear and bloody.  IR is following the pt and has evaled the neph tubes with no intervention recommended. She is not on anticoagulation.   She is currently using the bedside commode.  She denies any new pain and sx.   Past Medical History:  Diagnosis Date  . Acquired pancytopenia ([place]) 03/2018  . Anemia    Mild  . Cancer associated pain   . 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 Chemotherapy induced nausea and vomiting   . Diabetes mellitus type 2, diet-controlled ([place])    pt. denies No meds  . Frequency of urination   . History of cellulitis 2018   bilateral lower leg w/ mrsa  . History of external beam radiation therapy 02-05-2018  to 03-16-2018   cervical cancer  . History of MRSA infection 2018   w/ bilateral lower leg cellulitis  . HIV (human immunodeficiency virus  infection) ([place])    asymptomatic  . Hypomagnesemia 03/2018   Severe---- takes oral magnesium and IV magnesium as needed (cancer center)  . Intermittent diarrhea    due to radiation/ chemo  . Nocturia   . Port-A-Cath in place    Power port  . Radiation burn    LOWER ABD.  04-06-2018  per is healing  . Subclinical hypothyroidism    w/ thyroiditis , dx 01-22-2018 PET scan  . Thrombocytopenia ([place])   . Wears glasses     Past Surgical History:  Procedure Laterality Date  . CYSTOSCOPY     retrograde pyelogram right ureteral stent placement  . CYSTOSCOPY W/ URETERAL STENT PLACEMENT Bilateral 10/24/2018   Procedure: CYSTOSCOPY WITH BILATERAL RETROGRADE PYELOGRAM RIGHT Wyvonnia Dusky STENT PLACEMENT;  Surgeon: Ardis Hughs, MD;  Location: [facility];  Service: Urology;  Laterality: Bilateral;  . EUA/ CERVICAL BX  01-04-2018   dr Ihor Dow  [facility]  . IR FLUORO GUIDE PORT INSERTION RIGHT  02/01/2018  . IR NEPHROSTOMY EXCHANGE LEFT  12/10/2018  . IR NEPHROSTOMY EXCHANGE RIGHT  12/10/2018  . IR NEPHROSTOMY PLACEMENT LEFT  11/04/2018  . IR NEPHROSTOMY PLACEMENT RIGHT  11/04/2018  . TANDEM RING INSERTION N/A 03/20/2018   Procedure: TANDEM RING INSERTION;  Surgeon: Gery Pray, MD;  Location: [facility];  Service: Urology;  Laterality: N/A;  . TANDEM RING INSERTION N/A 03/26/2018   Procedure: TANDEM RING INSERTION;  Surgeon: Gery Pray,  MD;  Location: [place];  Service: Urology;  Laterality: N/A;  . TANDEM RING INSERTION N/A 04/04/2018   Procedure: TANDEM RING INSERTION;  Surgeon: Gery Pray, MD;  Location: [facility];  Service: Urology;  Laterality: N/A;  . TANDEM RING INSERTION N/A 04/12/2018   Procedure: TANDEM RING INSERTION;  Surgeon: Gery Pray, MD;  Location: [facility];  Service: Urology;  Laterality: N/A;  . TANDEM RING INSERTION N/A 04/18/2018   Procedure: TANDEM RING INSERTION;  Surgeon: Gery Pray, MD;  Location:  [facility];  Service: Urology;  Laterality: N/A;  . TUBAL LIGATION Bilateral 1990s     Current Hospital Medications:  Home meds:  . Current Meds  Medication Sig  . acetaminophen (TYLENOL) 500 MG tablet Take 500 mg by mouth every 6 (six) hours as needed for mild pain.  Marland Kitchen BIKTARVY 50-200-25 MG TABS tablet TAKE 1 TABLET BY MOUTH DAILY. (Patient taking differently: Take 1 tablet by mouth daily. )  . magnesium oxide (MAG-OX) 400 (241.3 Mg) MG tablet Take 1 tablet (400 mg total) by mouth 2 (two) times daily.  . methadone (DOLOPHINE) 5 MG tablet Take 5 mg by mouth 3 (three) times daily.  . Oxycodone HCl 10 MG TABS Take 10-20 mg by mouth every 4 (four) hours as needed (pain).    Scheduled Meds: . bictegravir-emtricitabine-tenofovir AF  1 tablet Oral Daily  . clonazePAM  0.25 mg Oral BID  . feeding supplement (ENSURE ENLIVE)  237 mL Oral BID BM  . lidocaine      . methadone  5 mg Oral Q8H  . methocarbamol  500 mg Oral TID  . mirtazapine  7.5 mg Oral QHS  . senna-docusate  2 tablet Oral BID  . sodium chloride flush  10-40 mL Intracatheter Q12H   Continuous Infusions: . sodium chloride    . magnesium sulfate 1 - 4 g bolus IVPB 2 g (01/10/19 1521)   PRN Meds:.acetaminophen **OR** acetaminophen, diphenhydrAMINE, heparin lock flush, HYDROmorphone (DILAUDID) injection, HYDROmorphone, iohexol, metoprolol tartrate, ondansetron **OR** ondansetron (ZOFRAN) IV, promethazine, sodium chloride flush, sodium chloride flush  Allergies: No Known Allergies  Family History  Problem Relation Age of Onset  . Cancer Maternal Grandmother        unknown ca    Social History:  reports that she has never smoked. She has never used smokeless tobacco. She reports previous alcohol use. She reports that she does not use drugs.  ROS: A complete review of systems was performed.  All systems are negative except for pertinent findings as noted.  Physical Exam:  Vital signs in last 24  hours: Temp:  [98.1 F (36.7 C)-99.1 F (37.3 C)] 99.1 F (37.3 C) (03/05 1315) Pulse Rate:  [112-124] 124 (03/05 1315) Resp:  [18-24] 18 (03/05 1315) BP: (95-118)/(74-84) 118/84 (03/05 1315) SpO2:  [98 %-100 %] 98 % (03/05 1315) Constitutional:  Alert and oriented, No acute distress Cardiovascular: Regular rate and rhythm Respiratory: Normal respiratory effort GI: cannot be adequately assessed as pt is on bed side commode GU: bilateral neph tubes in place with c/d dressing.  Right bag with dark bloody urine; left bag clear/yellow urine Lymphatic: cannot assess Neurologic: Grossly intact, no focal deficits Psychiatric: Normal mood and affect  Laboratory Data:  Recent Labs    01/08/19 0807 01/09/19 0515 01/10/19 0319  WBC 10.8* 11.0* 11.5*  HGB 8.3* 8.1* 7.9*  HCT 26.9* 27.1* 27.0*  PLT 312 324 374    Recent Labs  01/08/19 0807 01/09/19 0515 01/10/19 0319  NA 138 139 138  K 4.0 3.9 3.8  CL 108 108 107  GLUCOSE 82 89 76  BUN 11 12 13   CALCIUM 8.0* 8.0* 8.1*  CREATININE 1.44* 1.40* 1.45*     Results for orders placed or performed during the hospital encounter of 01/07/19 (from the past 24 hour(s))  Basic metabolic panel     Status: Abnormal   Collection Time: 01/10/19  3:19 AM  Result Value Ref Range   Sodium 138 135 - 145 mmol/L   Potassium 3.8 3.5 - 5.1 mmol/L   Chloride 107 98 - 111 mmol/L   CO2 23 22 - 32 mmol/L   Glucose, Bld 76 70 - 99 mg/dL   BUN 13 6 - 20 mg/dL   Creatinine, Ser 1.45 (H) 0.44 - 1.00 mg/dL   Calcium 8.1 (L) 8.9 - 10.3 mg/dL   GFR calc non Af Amer 41 (L) >60 mL/min   GFR calc Af Amer 48 (L) >60 mL/min   Anion gap 8 5 - 15  Magnesium     Status: Abnormal   Collection Time: 01/10/19  3:19 AM  Result Value Ref Range   Magnesium 1.6 (L) 1.7 - 2.4 mg/dL  CBC with Differential/Platelet     Status: Abnormal   Collection Time: 01/10/19  3:19 AM  Result Value Ref Range   WBC 11.5 (H) 4.0 - 10.5 K/uL   RBC 2.74 (L) 3.87 - 5.11 MIL/uL    Hemoglobin 7.9 (L) 12.0 - 15.0 g/dL   HCT 27.0 (L) 36.0 - 46.0 %   MCV 98.5 80.0 - 100.0 fL   MCH 28.8 26.0 - 34.0 pg   MCHC 29.3 (L) 30.0 - 36.0 g/dL   RDW 17.1 (H) 11.5 - 15.5 %   Platelets 374 150 - 400 K/uL   nRBC 0.0 0.0 - 0.2 %   Neutrophils Relative % 69 %   Neutro Abs 8.0 (H) 1.7 - 7.7 K/uL   Lymphocytes Relative 21 %   Lymphs Abs 2.5 0.7 - 4.0 K/uL   Monocytes Relative 7 %   Monocytes Absolute 0.8 0.1 - 1.0 K/uL   Eosinophils Relative 1 %   Eosinophils Absolute 0.1 0.0 - 0.5 K/uL   Basophils Relative 0 %   Basophils Absolute 0.0 0.0 - 0.1 K/uL   Immature Granulocytes 2 %   Abs Immature Granulocytes 0.18 (H) 0.00 - 0.07 K/uL   Recent Results (from the past 240 hour(s))  Culture, blood (routine x 2)     Status: None (Preliminary result)   Collection Time: 01/09/19  8:58 AM  Result Value Ref Range Status   Specimen Description   Final    BLOOD LEFT ARM Performed at [facility], [address] [place], [place] [address]    Special Requests   Final    BOTTLES DRAWN AEROBIC AND ANAEROBIC Blood Culture adequate volume Performed at [facility] [address], [place] [employer], [place] [address]    Culture   Final    NO GROWTH 1 DAY Performed at [facility], [place] [address] [place], [place] [address]    Report Status PENDING  Incomplete  Culture, blood (routine x 2)     Status: None (Preliminary result)   Collection Time: 01/09/19  8:58 AM  Result Value Ref Range Status   Specimen Description   Final    BLOOD RIGHT ARM Performed at [facility] [place], [place]  [address]    Special Requests   Final    BOTTLES DRAWN AEROBIC AND ANAEROBIC Blood Culture adequate volume Performed at [facility] [address] [place], [place] [address]    Culture   Final    NO GROWTH 1 DAY Performed at [facility], [place] [address], [place], [place] [address]    Report Status PENDING   Incomplete  Culture, Urine     Status: Abnormal   Collection Time: 01/09/19  9:25 AM  Result Value Ref Range Status   Specimen Description   Final    URINE, CATHETERIZED Performed at [facility] [address] [place], [place] [address]    Special Requests Immunocompromised  Final   Culture (A)  Final    <10,000 COLONIES/mL INSIGNIFICANT GROWTH Performed at [facility], [place] [address] [place], [place] [address]    Report Status 01/10/2019 FINAL  Final    Renal Function: Recent Labs    01/07/19 0353 01/08/19 0807 01/09/19 0515 01/10/19 0319  CREATININE 1.30* 1.44* 1.40* 1.45*   Estimated Creatinine Clearance: 41.3 mL/min (A) (by C-G formula based on SCr of 1.45 mg/dL (H)).  Radiologic Imaging: .CT RENAL STONE STUDY CLINICAL DATA:  Bilateral nephrostomy tube with pain and bleeding on the right.  EXAM: CT ABDOMEN AND PELVIS WITHOUT CONTRAST  TECHNIQUE: Multidetector CT imaging of the abdomen and pelvis was performed following the standard protocol without IV contrast.  COMPARISON:  12/15/2018  FINDINGS: Lower chest:  No contributory findings.  Hepatobiliary: No focal liver abnormality.No evidence of biliary obstruction or stone.  Pancreas: Unremarkable.  Spleen: Unremarkable.  Adrenals/Urinary Tract: Negative adrenals. Bilateral percutaneous nephrostomy tube that is located. There is also a right-sided ureteral stent. No hydronephrosis. Cystic density about the left kidney which could be parenchymal or subcapsular, up to 4.8 cm and stable. Indistinct high-density within the bladder, compatible with hemorrhage in this clinical setting. No superimposed stone is seen. No detectable bladder wall thickening to imply radiation cystitis.  Stomach/Bowel: Formed stool throughout most of the colon. Negative for obstruction or visible bowel wall thickening.  Vascular/Lymphatic: Asymmetric right lower extremity edema with common femoral  vein mural thickening, suspect there is subacute or chronic DVT when correlated with previous imaging.  Reproductive:History of cervical cancer with indistinct soft tissue planes in the pelvis correlating with radiotherapy. Fibroid is present on the right, measuring 3.5 cm.  Other: No ascites or pneumoperitoneum. Indistinct retroperitoneal soft tissues without discrete adenopathy, stable.  Musculoskeletal: Osteopenia.  No acute finding  IMPRESSION: 1. High-density clot within the urinary bladder from uncertain source. 2. Bilateral percutaneous nephrostomy tubes with no hydronephrosis. Located right ureteral stent. 3. Suspect subacute or chronic femoral DVT on the right, please correlate with venous ultrasound.  Electronically Signed   By: Monte Fantasia M.D.   On: 01/07/2019 04:43   Impression/Recommendation:  Hematuria in right nephrostomy bag--precipitated by accidental pulling on bag several days ago.  She also has a right ureteral stent which may be contributing.  She does not have any other sx of UTI but a UA should be checked to rule this out.  No intervention is necessary at this point.  She should f/u with Dr. Louis Meckel as an outpatient to discuss removal of right ureteral stent.   Neph tubes per IR  Estill Bamberg Alic Hilburn 01/10/2019, 3:55 PM

## 2019-01-10 NOTE — Progress Notes (Signed)
LCSW consulted for SNF placement.  Patient has cancer diagnosis with poor prognosis.   Patient currently has palliative at home. Patients spouse and daughter are primary care givers.   Hospice working with family to determine additional resources for patient.   Patient will dc home when stable.   Patient will transport by [facility] with ready.  Please see hospice notes for additional info.  LCSW will follow for dc needs.  Carolin Coy [place]

## 2019-01-10 NOTE — Progress Notes (Addendum)
Manufacturing engineer [facility]) Hospice RN Visit @ 1300  This is a related admission with a hospice diagnosis of cervical cancer per Dr. Tomasa Hosteller.  Pt called hospice 3/2 with concerns of bleeding from her nephrostomy tubes and increased pain.  She requested to be evaluated at the ED.  She is admitted to the hospital with anemia and pain.  Visited pt at the bedside, no family present and pt is asleep and appears comfortable.  Did not wake her.   V/S:  98.7 oral, 117/74, HR 113, RR 19, SPO2 99% RA  Labwork:  Creat 1.45, mag 1.6, GFR 48, WBC 11.5, RBC 2.74, HGB 7.9, HCT 27, neut # 8  No recent diagnostics.  PRNs, IVs:  Dilaudid 0.5 mg IV x 1, dilaudid 1mg  PO x 3  Problem list: Anemia:  she is at her baseline Pain:  she is currently asleep, appears comfortable  PMT MD Note: Patient to continue PO Methadone, she is also on PO PRN Dilaudid.  Remeron low dose has been added, for mood and appetite stimulation.  Attempted to re discuss with husband as well as the patient  about code status and overall goals of care, the patient doesn't reply, her husband simply states, "we know that."  Patient has "total pain", she has more than physical component of pain, most of her distress is existential in nature, she and her husband are aware of the serious incurable and irreversible nature of her illness.  Will request chaplain follow up as an extra layer of support.  Goals of Care:  Return home with hospice support.   Investigation into aide services at home based on medicaid type (pending)   IDT:  Updated  Once ready for discharge, please use [facility] for transport as they contract this service for [facility].  Thank you, Venia Carbon BSN, RN, [facility] Liaison (direct # in [place], under [facility]) [phone]

## 2019-01-10 NOTE — Discharge Instructions (Signed)
Percutaneous Nephrostomy, Care After  This sheet gives you information about how to care for yourself after your procedure. Your health care provider may also give you more specific instructions. If you have problems or questions, contact your health care provider.  What can I expect after the procedure?  After the procedure, it is common to have:  Some soreness where the nephrostomy tube was inserted (tube insertion site).  Blood-tinged drainage from the nephrostomy tube for the first 24 hours.  Follow these instructions at home:  Activity  Return to your normal activities as told by your health care provider. Ask your health care provider what activities are safe for you.  Avoid activities that may cause the nephrostomy tubing to bend.  Do not take baths, swim, or use a hot tub until your health care provider approves. Ask your health care provider if you can take showers. Cover the nephrostomy tube dressing with a watertight covering when you take a shower.  Do not drive for 24 hours if you were given a medicine to help you relax (sedative).  Care of the tube insertion site    Follow instructions from your health care provider about how to take care of your tube insertion site. Make sure you:  Wash your hands with soap and water before you change your bandage (dressing). If soap and water are not available, use hand sanitizer.  Change your dressing as told by your health care provider. Be careful not to pull on the tube while removing the dressing.  When you change the dressing, wash the skin around the tube, rinse well, and pat the skin dry.  Check the tube insertion area every day for signs of infection. Check for:  More redness, swelling, or pain.  More fluid or blood.  Warmth.  Pus or a bad smell.  Care of the nephrostomy tube and drainage bag  Always keep the tubing, the leg bag, or the bedside drainage bags below the level of the kidney so that your urine drains freely.  When connecting your nephrostomy  tube to a drainage bag, make sure that there are no kinks in the tubing and that your urine is draining freely. You may want to use an elastic bandage to wrap any exposed tubing that goes from the nephrostomy tube to any of the connecting tubes.  At night, you may want to connect your nephrostomy tube or the leg bag to a larger bedside drainage bag.  Follow instructions from your health care provider about how to empty or change the drainage bag.  Empty the drainage bag when it becomes ? full.  Replace the drainage bag and any extension tubing that is connected to your nephrostomy tube every 3 weeks or as often as told by your health care provider. Your health care provider will explain how to change the drainage bag and extension tubing.  General instructions  Take over-the-counter and prescription medicines only as told by your health care provider.  Keep all follow-up visits as told by your health care provider. This is important.  Contact a health care provider if:  You have problems with any of the valves or tubing.  You have persistent pain or soreness in your back.  You have more redness, swelling, or pain around your tube insertion site.  You have more fluid or blood coming from your tube insertion site.  Your tube insertion site feels warm to the touch.  You have pus or a bad smell coming from your tube   insertion site.  You have increased urine output or you feel burning when urinating.  Get help right away if:  You have pain in your abdomen during the first week.  You have chest pain or have trouble breathing.  You have a new appearance of blood in your urine.  You have a fever or chills.  You have back pain that is not relieved by your medicine.  You have decreased urine output.  Your nephrostomy tube comes out.  This information is not intended to replace advice given to you by your health care provider. Make sure you discuss any questions you have with your health care provider.  Document Released:  06/16/2004 Document Revised: 08/05/2016 Document Reviewed: 08/05/2016  Elsevier Interactive Patient Education © 2019 [employer]

## 2019-01-11 LAB — BASIC METABOLIC PANEL
ANION GAP: 8 (ref 5–15)
BUN: 13 mg/dL (ref 6–20)
CO2: 23 mmol/L (ref 22–32)
Calcium: 8.1 mg/dL — ABNORMAL LOW (ref 8.9–10.3)
Chloride: 106 mmol/L (ref 98–111)
Creatinine, Ser: 1.38 mg/dL — ABNORMAL HIGH (ref 0.44–1.00)
GFR calc Af Amer: 51 mL/min — ABNORMAL LOW (ref >60)
GFR calc non Af Amer: 44 mL/min — ABNORMAL LOW (ref >60)
GLUCOSE: 90 mg/dL (ref 70–99)
Potassium: 3.8 mmol/L (ref 3.5–5.1)
Sodium: 137 mmol/L (ref 135–145)

## 2019-01-11 LAB — CBC WITH DIFFERENTIAL/PLATELET
Abs Immature Granulocytes: 0.17 10*3/uL — ABNORMAL HIGH (ref 0.00–0.07)
Basophils Absolute: 0 10*3/uL (ref 0.0–0.1)
Basophils Relative: 0 %
Eosinophils Absolute: 0 10*3/uL (ref 0.0–0.5)
Eosinophils Relative: 0 %
HCT: 26.3 % — ABNORMAL LOW (ref 36.0–46.0)
Hemoglobin: 7.7 g/dL — ABNORMAL LOW (ref 12.0–15.0)
Immature Granulocytes: 2 %
Lymphocytes Relative: 20 %
Lymphs Abs: 2.3 10*3/uL (ref 0.7–4.0)
MCH: 28.9 pg (ref 26.0–34.0)
MCHC: 29.3 g/dL — ABNORMAL LOW (ref 30.0–36.0)
MCV: 98.9 fL (ref 80.0–100.0)
MONOS PCT: 7 %
Monocytes Absolute: 0.8 10*3/uL (ref 0.1–1.0)
NEUTROS PCT: 71 %
Neutro Abs: 7.9 10*3/uL — ABNORMAL HIGH (ref 1.7–7.7)
Platelets: 393 10*3/uL (ref 150–400)
RBC: 2.66 MIL/uL — ABNORMAL LOW (ref 3.87–5.11)
RDW: 17.1 % — ABNORMAL HIGH (ref 11.5–15.5)
WBC: 11.2 10*3/uL — ABNORMAL HIGH (ref 4.0–10.5)
nRBC: 0 % (ref 0.0–0.2)

## 2019-01-11 LAB — MAGNESIUM: Magnesium: 2.2 mg/dL (ref 1.7–2.4)

## 2019-01-11 MED ORDER — MIRTAZAPINE 7.5 MG PO TABS: 7.5000 mg | ORAL_TABLET | Freq: Every day | ORAL | 0 refills | Status: AC

## 2019-01-11 MED ORDER — METHOCARBAMOL 500 MG PO TABS: 500.0000 mg | ORAL_TABLET | Freq: Three times a day (TID) | ORAL | 0 refills | Status: AC

## 2019-01-11 MED ORDER — SENNOSIDES-DOCUSATE SODIUM 8.6-50 MG PO TABS: 2.0000 | ORAL_TABLET | Freq: Two times a day (BID) | ORAL | 0 refills | Status: AC

## 2019-01-11 MED ORDER — CLONAZEPAM 0.5 MG PO TABS: 0.2500 mg | ORAL_TABLET | Freq: Two times a day (BID) | ORAL | 0 refills | Status: AC

## 2019-01-11 MED ORDER — HEPARIN SOD (PORK) LOCK FLUSH 100 UNIT/ML IV SOLN
500.0000 [IU] | INTRAVENOUS | Status: AC | PRN
  Administered 2019-01-11: 500 [IU]

## 2019-01-11 MED ORDER — LIP MEDEX EX OINT
TOPICAL_OINTMENT | CUTANEOUS | Status: AC
  Administered 2019-01-11: 14:00:00
  Filled 2019-01-11: qty 7

## 2019-01-11 MED ORDER — HYDROMORPHONE HCL 2 MG PO TABS: 1.0000 mg | ORAL_TABLET | ORAL | 0 refills | Status: DC | PRN

## 2019-01-11 MED FILL — clonazePAM 0.5 MG TABS: 0.5 | 30 days supply | Qty: 30 | Fill #0

## 2019-01-11 MED FILL — HYDROmorphone HCL 2 MG TABS: 2 | 7 days supply | Qty: 30 | Fill #0

## 2019-01-11 MED FILL — METHOCARBAMOL 500 MG TABLET: 500 | 30 days supply | Qty: 90 | Fill #0

## 2019-01-11 MED FILL — MIRTAZAPINE 7.5 MG TABLET: 7.5 | 30 days supply | Qty: 30 | Fill #0

## 2019-01-11 NOTE — Progress Notes (Signed)
Referring Physician(s): Dr. Tawanna Solo  Supervising Physician: Aletta Edouard  Patient Status:  [facility] - In-pt  Chief Complaint: Bilateral hydronephrosis secondary to cervical carcinoma s/p bilateral nephrostomy tube placement 11/05/2019 by Dr. Vernard Gambles Last exchanged bilaterally 12/10/2018 by Dr. Earleen Newport  Subjective: Patient s/p exchange yesterday.  No complaints.  Still with bloody urine on the right.   Allergies: Patient has no known allergies.  Medications: Prior to Admission medications   Medication Sig Start Date End Date Taking? Authorizing Provider  acetaminophen (TYLENOL) 500 MG tablet Take 500 mg by mouth every 6 (six) hours as needed for mild pain.   Yes [provider]  BIKTARVY 50-200-25 MG TABS tablet TAKE 1 TABLET BY MOUTH DAILY. Patient taking differently: Take 1 tablet by mouth daily.  09/14/18  Yes Comer, Okey Regal, MD  magnesium oxide (MAG-OX) 400 (241.3 Mg) MG tablet Take 1 tablet (400 mg total) by mouth 2 (two) times daily. 03/08/18  Yes Gorsuch, Ni, MD  methadone (DOLOPHINE) 5 MG tablet Take 5 mg by mouth 3 (three) times daily.   Yes [provider]  Oxycodone HCl 10 MG TABS Take 10-20 mg by mouth every 4 (four) hours as needed (pain).   Yes [provider]  clonazePAM (KLONOPIN) 0.5 MG tablet Take 0.5 tablets (0.25 mg total) by mouth 2 (two) times daily. 01/11/19   Shelly Coss, MD  HYDROmorphone (DILAUDID) 2 MG tablet Take 0.5 tablets (1 mg total) by mouth every 3 (three) hours as needed for moderate pain or severe pain. 01/11/19   Shelly Coss, MD  lidocaine-prilocaine (EMLA) cream Apply 1 application topically as needed. 11/15/18   [name], MD  methocarbamol (ROBAXIN) 500 MG tablet Take 1 tablet (500 mg total) by mouth 3 (three) times daily. 01/11/19   Shelly Coss, MD  mirtazapine (REMERON) 7.5 MG tablet Take 1 tablet (7.5 mg total) by mouth at bedtime. 01/11/19   Shelly Coss, MD  morphine (MS CONTIN) 30 MG 12 hr tablet Take 1  tablet (30 mg total) by mouth every 12 (twelve) hours. Patient not taking: Reported on 01/07/2019 12/19/18   Nita Sells, MD  morphine (MSIR) 30 MG tablet Take 1 tablet (30 mg total) by mouth every 6 (six) hours as needed for severe pain. Patient not taking: Reported on 01/07/2019 12/28/18   [name], MD  ondansetron (ZOFRAN) 8 MG tablet Take 1 tablet (8 mg total) by mouth every 8 (eight) hours as needed. Patient not taking: Reported on 01/07/2019 12/06/18   [name], MD  senna-docusate (SENOKOT-S) 8.6-50 MG tablet Take 2 tablets by mouth 2 (two) times daily. 01/11/19   Shelly Coss, MD     Vital Signs: BP 98/72 (BP Location: Left Arm)   Pulse (!) 106   Temp 99.4 F (37.4 C)   Resp 18   Ht 5\' 1"  (1.549 m)   Wt 160 lb (72.6 kg)   LMP 02/03/2018   SpO2 98%   BMI 30.23 kg/m   Physical Exam  NAD, drowsy but awakens Back: bilateral nephrostomy tubes intact.  Insertion sites are c/d/i. Right tube with bloody urine.  Left with clear, yellow urine.   Imaging: Ir Nephrostomy Exchange Left  Result Date: 01/10/2019 INDICATION: 53 year old with a history of cervical cancer and bilateral nephrostomy tubes. Patient also has a right ureter stent. There has been bleeding from the right nephrostomy tube. Recent CT demonstrated blood in the bladder and suggested that both nephrostomy tubes have been slightly retracted. Plan for bilateral nephrostomy tube exchanges. EXAM:  EXCHANGE BILATERAL NEPHROSTOMY TUBES WITH FLUOROSCOPY Physician: Stephan Minister. Anselm Pancoast, MD COMPARISON:  None. MEDICATIONS: Fentanyl 50 mcg ANESTHESIA/SEDATION: Fentanyl 50 mcg CONTRAST:  50 mL Omnipaque 300-administered into the collecting system(s) FLUOROSCOPY TIME:  Fluoroscopy Time: 2 minutes, 18 seconds, 34 mGy COMPLICATIONS: None immediate. PROCEDURE: The procedure was explained to the patient. The risks and benefits of the procedure were discussed and the patient's questions were addressed. Informed consent was obtained from the  patient. Both flanks were prepped and draped in sterile fashion. Maximal barrier sterile technique was utilized including caps, mask, sterile gowns, sterile gloves, sterile drape, hand hygiene and skin antiseptic. Contrast was injected through the left nephrostomy tube demonstrating placement in the renal collecting system. The catheter was cut and removed over a wire. New 10 French drain was advanced over the wire. Tube was reconstituted in left renal pelvis. Skin was anesthetized with 1% lidocaine and sutured to skin. Catheter was attached to gravity bag. Right nephrostomy tube was injected with contrast. The tube was removed over a Bentson wire and exchanged for a new tube with difficulty. Due to the small size of the collecting system and the existing ureter stent, the nephrostomy tube was not well positioned in the renal pelvis. Therefore, the catheter was removed. During removal of the nephrostomy tube, wire access was lost. Five French catheter was easily advanced back into the renal collecting system. The catheter was exchanged for a Ector catheter over a Bentson. Catheter was well positioned in the renal pelvis adjacent to the ureter stent. Contrast injection confirmed placement in the renal pelvis. Skin was anesthetized with 1% lidocaine and the catheter was sutured to the skin. Right nephrostomy tube was placed to gravity bag. IMPRESSION: Successful exchange of bilateral nephrostomy tubes with fluoroscopy. Patient has bilateral 10 French catheters. The right nephrostomy tube is a Bettey Mare catheter. Nephrostomy tube exchange was very uncomfortable for the patient due to abdominal pain. Patient will need conscious sedation for nephrostomy tube exchanges in the future. Electronically Signed   By: Markus Daft M.D.   On: 01/10/2019 17:08   Ir Nephrostomy Exchange Right  Result Date: 01/10/2019 INDICATION: 53 year old with a history of cervical cancer and bilateral nephrostomy tubes.  Patient also has a right ureter stent. There has been bleeding from the right nephrostomy tube. Recent CT demonstrated blood in the bladder and suggested that both nephrostomy tubes have been slightly retracted. Plan for bilateral nephrostomy tube exchanges. EXAM: EXCHANGE BILATERAL NEPHROSTOMY TUBES WITH FLUOROSCOPY Physician: Stephan Minister. Anselm Pancoast, MD COMPARISON:  None. MEDICATIONS: Fentanyl 50 mcg ANESTHESIA/SEDATION: Fentanyl 50 mcg CONTRAST:  50 mL Omnipaque 300-administered into the collecting system(s) FLUOROSCOPY TIME:  Fluoroscopy Time: 2 minutes, 18 seconds, 34 mGy COMPLICATIONS: None immediate. PROCEDURE: The procedure was explained to the patient. The risks and benefits of the procedure were discussed and the patient's questions were addressed. Informed consent was obtained from the patient. Both flanks were prepped and draped in sterile fashion. Maximal barrier sterile technique was utilized including caps, mask, sterile gowns, sterile gloves, sterile drape, hand hygiene and skin antiseptic. Contrast was injected through the left nephrostomy tube demonstrating placement in the renal collecting system. The catheter was cut and removed over a wire. New 10 French drain was advanced over the wire. Tube was reconstituted in left renal pelvis. Skin was anesthetized with 1% lidocaine and sutured to skin. Catheter was attached to gravity bag. Right nephrostomy tube was injected with contrast. The tube was removed over a Bentson wire and exchanged for a  new tube with difficulty. Due to the small size of the collecting system and the existing ureter stent, the nephrostomy tube was not well positioned in the renal pelvis. Therefore, the catheter was removed. During removal of the nephrostomy tube, wire access was lost. Five French catheter was easily advanced back into the renal collecting system. The catheter was exchanged for a Ellsworth catheter over a Bentson. Catheter was well positioned in the renal  pelvis adjacent to the ureter stent. Contrast injection confirmed placement in the renal pelvis. Skin was anesthetized with 1% lidocaine and the catheter was sutured to the skin. Right nephrostomy tube was placed to gravity bag. IMPRESSION: Successful exchange of bilateral nephrostomy tubes with fluoroscopy. Patient has bilateral 10 French catheters. The right nephrostomy tube is a Bettey Mare catheter. Nephrostomy tube exchange was very uncomfortable for the patient due to abdominal pain. Patient will need conscious sedation for nephrostomy tube exchanges in the future. Electronically Signed   By: Markus Daft M.D.   On: 01/10/2019 17:08    Labs:  CBC: Recent Labs    01/08/19 0807 01/09/19 0515 01/10/19 0319 01/11/19 0422  WBC 10.8* 11.0* 11.5* 11.2*  HGB 8.3* 8.1* 7.9* 7.7*  HCT 26.9* 27.1* 27.0* 26.3*  PLT 312 324 374 393    COAGS: Recent Labs    11/03/18 0425 11/04/18 0449 12/16/18 0346 01/07/19 0353  INR 1.46 1.42 1.42 1.1    BMP: Recent Labs    01/08/19 0807 01/09/19 0515 01/10/19 0319 01/11/19 0422  NA 138 139 138 137  K 4.0 3.9 3.8 3.8  CL 108 108 107 106  CO2 22 23 23 23   GLUCOSE 82 89 76 90  BUN 11 12 13 13   CALCIUM 8.0* 8.0* 8.1* 8.1*  CREATININE 1.44* 1.40* 1.45* 1.38*  GFRNONAA 42* 43* 41* 44*  GFRAA 48* 50* 48* 51*    LIVER FUNCTION TESTS: Recent Labs    12/17/18 0340 12/19/18 0500 01/07/19 0353 01/08/19 0807  BILITOT 2.3* 1.4* 1.1 0.9  AST 14* 13* 10* 11*  ALT 20 17 8 7   ALKPHOS 106 102 123 127*  PROT 5.4* 5.7* 6.6 6.7  ALBUMIN 1.7* 1.8* 2.3* 2.4*    Assessment and Plan: Bilateral nephrostomy tubes exchanged yesterday Patient to discharge home today with nephrostomy tubes in place.  Still with bloody urine on the right.  Plan made with Urology for possible stent removal.  Continue current management of drains.  Electronically Signed: Docia Barrier, PA 01/11/2019, 2:09 PM   I spent a total of 15 Minutes at the the  patient's bedside AND on the patient's hospital floor or unit, greater than 50% of which was counseling/coordinating care for hydronephrosis.

## 2019-01-11 NOTE — Progress Notes (Signed)
Was responding to a consult but patient was sleeping and seemed very peaceful. Conard Novak, Chaplain

## 2019-01-11 NOTE — Discharge Summary (Addendum)
Physician Discharge Summary  RASHEMA SEAWRIGHT IRC:[id number] DOB: [birth year] DOA: 01/07/2019  PCP: Patient, No Pcp Per  Admit date: 01/07/2019 Discharge date: 01/11/2019  Admitted From: Home Disposition:  Home  Discharge Condition:Stable CODE STATUS:FULL Diet recommendation: Regular   Brief/Interim Summary:  Patient is a 53 year old unfortunate female with history of metastatic cervical cancer, right lower extremity DVT, bilateral percutaneous nephrostomy tube placement for hydronephrosis, type 2 diabetes mellitus, cancer-related pain who is currently being followed by hospice at home presents to the emergency department with complaints of bloody drainage in the PCN bag.  No mention of fever or chills at home.CT imaging done in the emergency department did not show any hydronephrosis or dislodgment of the tube.  ED physician discussed with urology who recommended no intervention.  Patient admitted for blood transfusion and pain management.  Palliative care had been consulted and following. She remains full code. IR/Urology requested to evaluate.IR changed  the nephrostomy tubes.  Urology suggested to follow-up as an outpatient for the change of right ureteral stent. Her pain has improved.  Despite multiple discussion with palliative care, patient and family wants to remain as full code.  She is terminally ill.  Physical therapy recommended skilled nursing facility on discharge but she is not medically stable for discharge to skilled nursing facility.  She will be discharged today to home with hospice.  She has high risk  of readmission in the near future.   Following problems were addressed during her hospitalization:  Nephrostomy tube bleed/acute blood loss anemia: Presented with complaints of bleeding from the nephrostomy tube.  Her right-sided nephrostomy bag was filled with bloody urine.  Left-sided nephrostomy bag has clear urine. Hemoglobin in the range of 5 on presentation.  Transfused with 2  units of PRBC with improvement of hemoglobin in the range of 8 .Hb in the range of 7 today. Discussed with urology , urology did not recommend any change in the plan or any new intervention.  He commented to follow-up as an outpatient for changing of right ureteral stent. FOBT was also found to be positive.  She denies any change in the color of the stool.  Currently not sure how much help will it do regarding further work-up for the positive FOBT. We requested IR to look at the nephrostomy tubes due to continuous bleeding on the left nephrostomy bag.Tubes changed.  Cervical cancer stage IV: Follows with Dr. Alvy Bimler.  Not a candidate for chemotherapy.  She is being followed by hospice at home.  Currently she has cancer associated pain.  Continue current pain medications.  She was recently hospitalized and was discharged to home on hospice.  Pain was not controlled with MS Contin and she had been transitioned to methadone .  Palliative care has been consulted to discuss goals of care. Currently she is full code.  When she was discharged on 12/19/18 she was DNR.  CODE STATUS has been changed.  We have discussed with the patient at the bedside regarding her poor prognosis. We will discharge her Dilaudid oral and methadone .  History of HIV: On Biktarvy which we will continue.  Right lower extremity DVT: Not a candidate for anticoagulation or IVC filter placement.  Has severe bilateral lower extremities more on the right.  Sinus tachycardia: Most likely associated with pain.  Supportive care.  Acute kidney injury versus CKD: Renal function has actually improved.  Her creatinine when she was discharged on 2/12 was of 3.53.  Renal dysfunction associated with cervical cancer causing hydronephrosis  and improvement has been seen after nephrostomy tube placement. CT imaging also showed High-density clot within the urinary bladder from uncertain source.  Urology did not recommend any further  intervention.  Fever: Mild grade fever which has resolved. Negative   blood culture and urine culture.  Will not to start on antibiotics for now  Deconditioning/debility: PT recommended skilled nursing facility. Social worker consulted.Plan is to discharge home with hospice.   Discharge Diagnoses:  Principal Problem:   Nephrostomy tube bleed ([place]) Active Problems:   Primary cervical cancer with metastasis - Stage IV   Cancer associated pain   Diabetes mellitus without complication (HCC)   Acquired hypothyroidism   Anemia, blood loss   HIV (human immunodeficiency virus infection) ([place])   Hydronephrosis   Goals of care, counseling/discussion   Acute blood loss anemia   DVT (deep venous thrombosis) ([place])    Discharge Instructions  Discharge Instructions    Diet - low sodium heart healthy   Complete by:  As directed    Discharge instructions   Complete by:  As directed    1)Follow up with hospice services at home. 2)Take prescribed medication as instructed. 3)Follow up with as an outpatient.  Name and number the provider has been attached. 4)Do CBC and BMP tests in a week.   Increase activity slowly   Complete by:  As directed      Allergies as of 01/11/2019   No Known Allergies     Medication List    STOP taking these medications   morphine 30 MG 12 hr tablet Commonly known as:  MS CONTIN   morphine 30 MG tablet Commonly known as:  MSIR   Oxycodone HCl 10 MG Tabs     TAKE these medications   acetaminophen 500 MG tablet Commonly known as:  TYLENOL Take 500 mg by mouth every 6 (six) hours as needed for mild pain.   Biktarvy 50-200-25 MG Tabs tablet Generic drug:  bictegravir-emtricitabine-tenofovir AF TAKE 1 TABLET BY MOUTH DAILY.   clonazePAM 0.5 MG tablet Commonly known as:  KLONOPIN Take 0.5 tablets (0.25 mg total) by mouth 2 (two) times daily.   HYDROmorphone 2 MG tablet Commonly known as:  DILAUDID Take 0.5 tablets (1 mg total) by mouth every 3  (three) hours as needed for moderate pain or severe pain.   lidocaine-prilocaine cream Commonly known as:  EMLA Apply 1 application topically as needed.   magnesium oxide 400 (241.3 Mg) MG tablet Commonly known as:  MAG-OX Take 1 tablet (400 mg total) by mouth 2 (two) times daily.   methadone 5 MG tablet Commonly known as:  DOLOPHINE Take 5 mg by mouth 3 (three) times daily.   methocarbamol 500 MG tablet Commonly known as:  ROBAXIN Take 1 tablet (500 mg total) by mouth 3 (three) times daily.   mirtazapine 7.5 MG tablet Commonly known as:  REMERON Take 1 tablet (7.5 mg total) by mouth at bedtime.   ondansetron 8 MG tablet Commonly known as:  ZOFRAN Take 1 tablet (8 mg total) by mouth every 8 (eight) hours as needed.   senna-docusate 8.6-50 MG tablet Commonly known as:  Senokot-S Take 2 tablets by mouth 2 (two) times daily.      Follow-up Information    Ardis Hughs, MD. Schedule an appointment as soon as possible for a visit.   Specialty:  Urology Contact information: [address]  [place] [address] [phone]          No Known Allergies  Consultations:  Urology, IR, palliative care   Procedures/Studies: Ct Abdomen Pelvis Wo Contrast  Result Date: 12/15/2018 CLINICAL DATA:  Abdominal pain, fever. EXAM: CT ABDOMEN AND PELVIS WITHOUT CONTRAST TECHNIQUE: Multidetector CT imaging of the abdomen and pelvis was performed following the standard protocol without IV contrast. COMPARISON:  11/01/2018 FINDINGS: Lower chest: Nodule in the peripheral right middle lobe measures 7 mm on image 8. This is new since PET CT from 10/09/2018. No effusions. Heart is normal size. Hepatobiliary: No focal hepatic abnormality. Gallbladder unremarkable. Pancreas: No focal abnormality or ductal dilatation. Spleen: No focal abnormality.  Normal size. Adrenals/Urinary Tract: Bilateral nephrostomy catheters in place. Right ureteral stent in place. Previously seen left perinephric  fluid has decreased. No hydronephrosis currently. Adrenal glands and urinary bladder unremarkable. Stomach/Bowel: Stomach, large and small bowel grossly unremarkable. Vascular/Lymphatic: No evidence of aneurysm or adenopathy. Reproductive: Difficult to assess lower pelvic structures without intravenous and oral contrast. Other: No visible free fluid or free air. Musculoskeletal: No acute bony abnormality. IMPRESSION: Bilateral nephrostomy catheters in place with no hydronephrosis. Decreasing left perinephric fluid since prior study. Right ureteral stent in place. New 7 mm nodule in the peripheral right lung along the minor fissure. Cannot exclude metastasis. No definite acute process. Electronically Signed   By: Rolm Baptise M.D.   On: 12/15/2018 19:15   Dg Chest Port 1 View  Result Date: 12/15/2018 CLINICAL DATA:  Patient with diarrhea.  Back pain. EXAM: PORTABLE CHEST 1 VIEW COMPARISON:  Chest radiograph 01/25/2010 FINDINGS: Monitoring leads overlie the patient. Right anterior chest wall Port-A-Cath is present with tip projecting over the superior vena cava. Cardiomegaly. Bilateral interstitial pulmonary opacities. Low lung volumes. No large area pulmonary consolidation. No pleural effusion or pneumothorax. IMPRESSION: Bilateral interstitial opacities may represent mild pulmonary edema. Electronically Signed   By: Lovey Newcomer M.D.   On: 12/15/2018 14:34   Ct Renal Stone Study  Result Date: 01/07/2019 CLINICAL DATA:  Bilateral nephrostomy tube with pain and bleeding on the right. EXAM: CT ABDOMEN AND PELVIS WITHOUT CONTRAST TECHNIQUE: Multidetector CT imaging of the abdomen and pelvis was performed following the standard protocol without IV contrast. COMPARISON:  12/15/2018 FINDINGS: Lower chest:  No contributory findings. Hepatobiliary: No focal liver abnormality.No evidence of biliary obstruction or stone. Pancreas: Unremarkable. Spleen: Unremarkable. Adrenals/Urinary Tract: Negative adrenals. Bilateral  percutaneous nephrostomy tube that is located. There is also a right-sided ureteral stent. No hydronephrosis. Cystic density about the left kidney which could be parenchymal or subcapsular, up to 4.8 cm and stable. Indistinct high-density within the bladder, compatible with hemorrhage in this clinical setting. No superimposed stone is seen. No detectable bladder wall thickening to imply radiation cystitis. Stomach/Bowel: Formed stool throughout most of the colon. Negative for obstruction or visible bowel wall thickening. Vascular/Lymphatic: Asymmetric right lower extremity edema with common femoral vein mural thickening, suspect there is subacute or chronic DVT when correlated with previous imaging. Reproductive:History of cervical cancer with indistinct soft tissue planes in the pelvis correlating with radiotherapy. Fibroid is present on the right, measuring 3.5 cm. Other: No ascites or pneumoperitoneum. Indistinct retroperitoneal soft tissues without discrete adenopathy, stable. Musculoskeletal: Osteopenia.  No acute finding IMPRESSION: 1. High-density clot within the urinary bladder from uncertain source. 2. Bilateral percutaneous nephrostomy tubes with no hydronephrosis. Located right ureteral stent. 3. Suspect subacute or chronic femoral DVT on the right, please correlate with venous ultrasound. Electronically Signed   By: Monte Fantasia M.D.   On: 01/07/2019 04:43   Ir Nephrostomy Exchange Left  Result  Date: 01/10/2019 INDICATION: 53 year old with a history of cervical cancer and bilateral nephrostomy tubes. Patient also has a right ureter stent. There has been bleeding from the right nephrostomy tube. Recent CT demonstrated blood in the bladder and suggested that both nephrostomy tubes have been slightly retracted. Plan for bilateral nephrostomy tube exchanges. EXAM: EXCHANGE BILATERAL NEPHROSTOMY TUBES WITH FLUOROSCOPY Physician: Stephan Minister. Anselm Pancoast, MD COMPARISON:  None. MEDICATIONS: Fentanyl 50 mcg  ANESTHESIA/SEDATION: Fentanyl 50 mcg CONTRAST:  50 mL Omnipaque 300-administered into the collecting system(s) FLUOROSCOPY TIME:  Fluoroscopy Time: 2 minutes, 18 seconds, 34 mGy COMPLICATIONS: None immediate. PROCEDURE: The procedure was explained to the patient. The risks and benefits of the procedure were discussed and the patient's questions were addressed. Informed consent was obtained from the patient. Both flanks were prepped and draped in sterile fashion. Maximal barrier sterile technique was utilized including caps, mask, sterile gowns, sterile gloves, sterile drape, hand hygiene and skin antiseptic. Contrast was injected through the left nephrostomy tube demonstrating placement in the renal collecting system. The catheter was cut and removed over a wire. New 10 French drain was advanced over the wire. Tube was reconstituted in left renal pelvis. Skin was anesthetized with 1% lidocaine and sutured to skin. Catheter was attached to gravity bag. Right nephrostomy tube was injected with contrast. The tube was removed over a Bentson wire and exchanged for a new tube with difficulty. Due to the small size of the collecting system and the existing ureter stent, the nephrostomy tube was not well positioned in the renal pelvis. Therefore, the catheter was removed. During removal of the nephrostomy tube, wire access was lost. Five French catheter was easily advanced back into the renal collecting system. The catheter was exchanged for a [place] catheter over a Bentson. Catheter was well positioned in the renal pelvis adjacent to the ureter stent. Contrast injection confirmed placement in the renal pelvis. Skin was anesthetized with 1% lidocaine and the catheter was sutured to the skin. Right nephrostomy tube was placed to gravity bag. IMPRESSION: Successful exchange of bilateral nephrostomy tubes with fluoroscopy. Patient has bilateral 10 French catheters. The right nephrostomy tube is a Bettey Mare catheter. Nephrostomy tube exchange was very uncomfortable for the patient due to abdominal pain. Patient will need conscious sedation for nephrostomy tube exchanges in the future. Electronically Signed   By: Markus Daft M.D.   On: 01/10/2019 17:08   Ir Nephrostomy Exchange Right  Result Date: 01/10/2019 INDICATION: 53 year old with a history of cervical cancer and bilateral nephrostomy tubes. Patient also has a right ureter stent. There has been bleeding from the right nephrostomy tube. Recent CT demonstrated blood in the bladder and suggested that both nephrostomy tubes have been slightly retracted. Plan for bilateral nephrostomy tube exchanges. EXAM: EXCHANGE BILATERAL NEPHROSTOMY TUBES WITH FLUOROSCOPY Physician: Stephan Minister. Anselm Pancoast, MD COMPARISON:  None. MEDICATIONS: Fentanyl 50 mcg ANESTHESIA/SEDATION: Fentanyl 50 mcg CONTRAST:  50 mL Omnipaque 300-administered into the collecting system(s) FLUOROSCOPY TIME:  Fluoroscopy Time: 2 minutes, 18 seconds, 34 mGy COMPLICATIONS: None immediate. PROCEDURE: The procedure was explained to the patient. The risks and benefits of the procedure were discussed and the patient's questions were addressed. Informed consent was obtained from the patient. Both flanks were prepped and draped in sterile fashion. Maximal barrier sterile technique was utilized including caps, mask, sterile gowns, sterile gloves, sterile drape, hand hygiene and skin antiseptic. Contrast was injected through the left nephrostomy tube demonstrating placement in the renal collecting system. The catheter was cut and removed  over a wire. New 10 French drain was advanced over the wire. Tube was reconstituted in left renal pelvis. Skin was anesthetized with 1% lidocaine and sutured to skin. Catheter was attached to gravity bag. Right nephrostomy tube was injected with contrast. The tube was removed over a Bentson wire and exchanged for a new tube with difficulty. Due to the small size of the collecting  system and the existing ureter stent, the nephrostomy tube was not well positioned in the renal pelvis. Therefore, the catheter was removed. During removal of the nephrostomy tube, wire access was lost. Five French catheter was easily advanced back into the renal collecting system. The catheter was exchanged for a [place] catheter over a Bentson. Catheter was well positioned in the renal pelvis adjacent to the ureter stent. Contrast injection confirmed placement in the renal pelvis. Skin was anesthetized with 1% lidocaine and the catheter was sutured to the skin. Right nephrostomy tube was placed to gravity bag. IMPRESSION: Successful exchange of bilateral nephrostomy tubes with fluoroscopy. Patient has bilateral 10 French catheters. The right nephrostomy tube is a Bettey Mare catheter. Nephrostomy tube exchange was very uncomfortable for the patient due to abdominal pain. Patient will need conscious sedation for nephrostomy tube exchanges in the future. Electronically Signed   By: Markus Daft M.D.   On: 01/10/2019 17:08   Vas [name] Lower Extremity Venous (dvt)  Result Date: 12/17/2018  Lower Venous Study Indications: Swelling.  Limitations: Body habitus and poor ultrasound/tissue interface. Performing Technologist: Oliver Hum RVT  Examination Guidelines: A complete evaluation includes B-mode imaging, spectral Doppler, color Doppler, and power Doppler as needed of all accessible portions of each vessel. Bilateral testing is considered an integral part of a complete examination. Limited examinations for reoccurring indications may be performed as noted.  Right Venous Findings: +---------+---------------+---------+-----------+----------+-------+          CompressibilityPhasicitySpontaneityPropertiesSummary +---------+---------------+---------+-----------+----------+-------+ CFV      None           No       No                   Acute    +---------+---------------+---------+-----------+----------+-------+ SFJ      Full                                                 +---------+---------------+---------+-----------+----------+-------+ FV Prox                 No       No                           +---------+---------------+---------+-----------+----------+-------+ FV Mid                  No       No                           +---------+---------------+---------+-----------+----------+-------+ FV Distal               No       No                           +---------+---------------+---------+-----------+----------+-------+ POP      Partial  No       No                   Acute   +---------+---------------+---------+-----------+----------+-------+ PTV      Full                                                 +---------+---------------+---------+-----------+----------+-------+ PERO     Full                                                 +---------+---------------+---------+-----------+----------+-------+ Unable to visualize the common iliac, and external iliac veins.  Left Venous Findings: +---------+---------------+---------+-----------+----------+-------+          CompressibilityPhasicitySpontaneityPropertiesSummary +---------+---------------+---------+-----------+----------+-------+ CFV      Full           No       No                           +---------+---------------+---------+-----------+----------+-------+ SFJ      Full                                                 +---------+---------------+---------+-----------+----------+-------+ FV Prox  Full                                                 +---------+---------------+---------+-----------+----------+-------+ FV Mid   Full                                                 +---------+---------------+---------+-----------+----------+-------+ FV DistalFull                                                  +---------+---------------+---------+-----------+----------+-------+ PFV      Full                                                 +---------+---------------+---------+-----------+----------+-------+ POP      Full           No       No                           +---------+---------------+---------+-----------+----------+-------+ PTV      Full                                                 +---------+---------------+---------+-----------+----------+-------+ PERO  Full                                                 +---------+---------------+---------+-----------+----------+-------+    Summary: Right: Findings consistent with acute deep vein thrombosis involving the right common femoral vein, and right popliteal vein. No cystic structure found in the popliteal fossa. Left: There is no evidence of deep vein thrombosis in the lower extremity. However, portions of this examination were limited- see technologist comments above. No cystic structure found in the popliteal fossa.  *See table(s) above for measurements and observations. Electronically signed by Ruta Hinds MD on 12/17/2018 at 5:21:22 PM.    Final        Subjective: Patient seen and examined at the bedside this morning.  More comfortable today pain is better controlled.  We will discharge her home.  Discussed and answered questions to husband at the bedside.  Discharge Exam: Vitals:   01/11/19 0519 01/11/19 0519  BP: 112/79   Pulse: (!) 121 (!) 119  Resp: 18   Temp:  98.6 F (37 C)  SpO2: 100% 100%   Vitals:   01/10/19 1315 01/10/19 2110 01/11/19 0519 01/11/19 0519  BP: 118/84 107/74 112/79   Pulse: (!) 124 (!) 117 (!) 121 (!) 119  Resp: 18 16 18    Temp: 99.1 F (37.3 C) 99.7 F (37.6 C)  98.6 F (37 C)  TempSrc:  Oral  Oral  SpO2: 98% 100% 100% 100%  Weight:      Height:        General: Pt is alert, awake, ill looking, generalized weakness Cardiovascular: RRR, S1/S2 +, no rubs, no  gallops Respiratory: CTA bilaterally, no wheezing, no rhonchi Abdominal: Soft, NT, ND, bowel sounds + Extremities: Bilateral lower extremity edema, no cyanosis    The results of significant diagnostics from this hospitalization (including imaging, microbiology, ancillary and laboratory) are listed below for reference.     Microbiology: Recent Results (from the past 240 hour(s))  Culture, blood (routine x 2)     Status: None (Preliminary result)   Collection Time: 01/09/19  8:58 AM  Result Value Ref Range Status   Specimen Description   Final    BLOOD LEFT ARM Performed at [facility] [address], [place], [place] [address]    Special Requests   Final    BOTTLES DRAWN AEROBIC AND ANAEROBIC Blood Culture adequate volume Performed at [facility] [address] [place], [place] [address]    Culture   Final    NO GROWTH 1 DAY Performed at [facility], [place] [address], [place], [place] [address]    Report Status PENDING  Incomplete  Culture, blood (routine x 2)     Status: None (Preliminary result)   Collection Time: 01/09/19  8:58 AM  Result Value Ref Range Status   Specimen Description   Final    BLOOD RIGHT ARM Performed at [facility] [address], [place], [place] [address]    Special Requests   Final    BOTTLES DRAWN AEROBIC AND ANAEROBIC Blood Culture adequate volume Performed at [facility] [address], [place], [place] [address]    Culture   Final    NO GROWTH 1 DAY Performed at [facility], [place] [address] [place], [place] [address]  Report Status PENDING  Incomplete  Culture, Urine     Status: Abnormal   Collection Time: 01/09/19  9:25 AM  Result Value Ref Range Status   Specimen Description   Final    URINE, CATHETERIZED Performed at [facility] [address], [place], [place] [address]    Special Requests Immunocompromised  Final    Culture (A)  Final    <10,000 COLONIES/mL INSIGNIFICANT GROWTH Performed at [facility], [place] [address] [place], [place] [address]    Report Status 01/10/2019 FINAL  Final     Labs: BNP (last 3 results) No results for input(s): BNP in the last 8760 hours. Basic Metabolic Panel: Recent Labs  Lab 01/07/19 0353 01/08/19 0807 01/09/19 0515 01/10/19 0319 01/11/19 0422  NA 135 138 139 138 137  K 4.1 4.0 3.9 3.8 3.8  CL 107 108 108 107 106  CO2 25 22 23 23 23   GLUCOSE 86 82 89 76 90  BUN 12 11 12 13 13   CREATININE 1.30* 1.44* 1.40* 1.45* 1.38*  CALCIUM 7.8* 8.0* 8.0* 8.1* 8.1*  MG  --   --   --  1.6* 2.2   Liver Function Tests: Recent Labs  Lab 01/07/19 0353 01/08/19 0807  AST 10* 11*  ALT 8 7  ALKPHOS 123 127*  BILITOT 1.1 0.9  PROT 6.6 6.7  ALBUMIN 2.3* 2.4*   No results for input(s): LIPASE, AMYLASE in the last 168 hours. No results for input(s): AMMONIA in the last 168 hours. CBC: Recent Labs  Lab 01/07/19 0353 01/08/19 0807 01/09/19 0515 01/10/19 0319 01/11/19 0422  WBC 9.5 10.8* 11.0* 11.5* 11.2*  NEUTROABS 6.5  --  7.4 8.0* 7.9*  HGB 5.3* 8.3* 8.1* 7.9* 7.7*  HCT 18.1* 26.9* 27.1* 27.0* 26.3*  MCV 100.0 95.1 97.1 98.5 98.9  PLT 301 312 324 374 393   Cardiac Enzymes: No results for input(s): CKTOTAL, CKMB, CKMBINDEX, TROPONINI in the last 168 hours. BNP: Invalid input(s): POCBNP CBG: No results for input(s): GLUCAP in the last 168 hours. D-Dimer No results for input(s): DDIMER in the last 72 hours. Hgb A1c No results for input(s): HGBA1C in the last 72 hours. Lipid Profile No results for input(s): CHOL, HDL, LDLCALC, TRIG, CHOLHDL, LDLDIRECT in the last 72 hours. Thyroid function studies No results for input(s): TSH, T4TOTAL, T3FREE, THYROIDAB in the last 72 hours.  Invalid input(s): FREET3 Anemia work up No results for input(s): VITAMINB12, FOLATE, FERRITIN, TIBC, IRON, RETICCTPCT in the last 72 hours. Urinalysis    Component  Value Date/Time   COLORURINE BROWN (A) 12/15/2018 1515   APPEARANCEUR TURBID (A) 12/15/2018 1515   LABSPEC 1.020 12/15/2018 1515   PHURINE 6.5 12/15/2018 1515   GLUCOSEU NEGATIVE 12/15/2018 1515   HGBUR LARGE (A) 12/15/2018 1515   BILIRUBINUR LARGE (A) 12/15/2018 1515   KETONESUR TRACE (A) 12/15/2018 1515   PROTEINUR >300 (A) 12/15/2018 1515   UROBILINOGEN 0.2 01/04/2017 1028   NITRITE POSITIVE (A) 12/15/2018 1515   LEUKOCYTESUR MODERATE (A) 12/15/2018 1515   Sepsis Labs Invalid input(s): PROCALCITONIN,  WBC,  LACTICIDVEN Microbiology Recent Results (from the past 240 hour(s))  Culture, blood (routine x 2)     Status: None (Preliminary result)   Collection Time: 01/09/19  8:58 AM  Result Value Ref Range Status   Specimen Description   Final    BLOOD LEFT ARM Performed at [facility] [address], [place], [place] [address]    Special Requests   Final  BOTTLES DRAWN AEROBIC AND ANAEROBIC Blood Culture adequate volume Performed at [facility] [address], [place], [place] [address]    Culture   Final    NO GROWTH 1 DAY Performed at [facility], [place] [address] [place], [place] [address]    Report Status PENDING  Incomplete  Culture, blood (routine x 2)     Status: None (Preliminary result)   Collection Time: 01/09/19  8:58 AM  Result Value Ref Range Status   Specimen Description   Final    BLOOD RIGHT ARM Performed at [facility] [address], [place], [place] [address]    Special Requests   Final    BOTTLES DRAWN AEROBIC AND ANAEROBIC Blood Culture adequate volume Performed at [facility] [address], [place], [place] [address]    Culture   Final    NO GROWTH 1 DAY Performed at [facility], [place] [address] [place], [place] [address]    Report Status PENDING  Incomplete  Culture, Urine     Status: Abnormal   Collection Time: 01/09/19  9:25 AM  Result Value  Ref Range Status   Specimen Description   Final    URINE, CATHETERIZED Performed at [facility] [address] [place], [place] [address]    Special Requests Immunocompromised  Final   Culture (A)  Final    <10,000 COLONIES/mL INSIGNIFICANT GROWTH Performed at [facility], [place] [address], [place], [place] [address]    Report Status 01/10/2019 FINAL  Final    Please note: You were cared for by a hospitalist during your hospital stay. Once you are discharged, your primary care physician will handle any further medical issues. Please note that NO REFILLS for any discharge medications will be authorized once you are discharged, as it is imperative that you return to your primary care physician (or establish a relationship with a primary care physician if you do not have one) for your post hospital discharge needs so that they can reassess your need for medications and monitor your lab values.    Time coordinating discharge: 40 minutes  SIGNED:   Shelly Coss, MD  [facility] 01/11/2019, 10:18 AM Pager [phone]  If 7PM-7AM, please contact night-coverage [email or web address] Password [email or web address]

## 2019-01-14 ENCOUNTER — Telehealth: Payer: Self-pay

## 2019-01-14 LAB — CULTURE, BLOOD (ROUTINE X 2)
CULTURE: NO GROWTH
CULTURE: NO GROWTH
Special Requests: ADEQUATE
Special Requests: ADEQUATE

## 2019-01-14 NOTE — Telephone Encounter (Signed)
Spoke with Seth Bake, Hospice nurse, regarding pt's uncontrolled pain.  Pt was recently hospitalized and pain meds were changed from [place] contin and MSIR to dilaudid and methadone.  Seth Bake wants to know if Dr Alvy Bimler will make changes d/t pt still has uncontrolled pain.  Dr Alvy Bimler suggest pt go back on MSContin and MSIR or go to the ED.  Seth Bake to discuss with Hospice MD and call me with an update.  She has also offered to have pt admitted to [facility] for pain management but pt refuses at this time.

## 2019-01-27 ENCOUNTER — Inpatient Hospital Stay (HOSPITAL_COMMUNITY)
Admission: EM | Admit: 2019-01-27 | Discharge: 2019-01-30 | DRG: 871 | Disposition: A | Discharge status: Hospice | Payer: Medicaid Other | Attending: Internal Medicine | Admitting: Internal Medicine

## 2019-01-27 ENCOUNTER — Emergency Department (HOSPITAL_COMMUNITY): Payer: Medicaid Other

## 2019-01-27 ENCOUNTER — Other Ambulatory Visit: Payer: Self-pay

## 2019-01-27 ENCOUNTER — Inpatient Hospital Stay (HOSPITAL_COMMUNITY): Payer: Medicaid Other

## 2019-01-27 ENCOUNTER — Encounter (HOSPITAL_COMMUNITY): Payer: Self-pay | Admitting: *Deleted

## 2019-01-27 DIAGNOSIS — J189 Pneumonia, unspecified organism: Secondary | ICD-10-CM | POA: Diagnosis present

## 2019-01-27 DIAGNOSIS — Z79899 Other long term (current) drug therapy: Secondary | ICD-10-CM | POA: Diagnosis not present

## 2019-01-27 DIAGNOSIS — D62 Acute posthemorrhagic anemia: Secondary | ICD-10-CM | POA: Diagnosis present

## 2019-01-27 DIAGNOSIS — Y733 Surgical instruments, materials and gastroenterology and urology devices (including sutures) associated with adverse incidents: Secondary | ICD-10-CM | POA: Diagnosis present

## 2019-01-27 DIAGNOSIS — A419 Sepsis, unspecified organism: Secondary | ICD-10-CM | POA: Diagnosis present

## 2019-01-27 DIAGNOSIS — R05 Cough: Secondary | ICD-10-CM

## 2019-01-27 DIAGNOSIS — R509 Fever, unspecified: Secondary | ICD-10-CM

## 2019-01-27 DIAGNOSIS — E039 Hypothyroidism, unspecified: Secondary | ICD-10-CM | POA: Diagnosis present

## 2019-01-27 DIAGNOSIS — N136 Pyonephrosis: Secondary | ICD-10-CM | POA: Diagnosis present

## 2019-01-27 DIAGNOSIS — C539 Malignant neoplasm of cervix uteri, unspecified: Secondary | ICD-10-CM | POA: Diagnosis present

## 2019-01-27 DIAGNOSIS — B3749 Other urogenital candidiasis: Secondary | ICD-10-CM | POA: Diagnosis present

## 2019-01-27 DIAGNOSIS — Z8619 Personal history of other infectious and parasitic diseases: Secondary | ICD-10-CM | POA: Diagnosis not present

## 2019-01-27 DIAGNOSIS — C799 Secondary malignant neoplasm of unspecified site: Secondary | ICD-10-CM | POA: Diagnosis present

## 2019-01-27 DIAGNOSIS — E119 Type 2 diabetes mellitus without complications: Secondary | ICD-10-CM

## 2019-01-27 DIAGNOSIS — N179 Acute kidney failure, unspecified: Secondary | ICD-10-CM | POA: Diagnosis present

## 2019-01-27 DIAGNOSIS — I82409 Acute embolism and thrombosis of unspecified deep veins of unspecified lower extremity: Secondary | ICD-10-CM | POA: Diagnosis present

## 2019-01-27 DIAGNOSIS — I82401 Acute embolism and thrombosis of unspecified deep veins of right lower extremity: Secondary | ICD-10-CM | POA: Diagnosis present

## 2019-01-27 DIAGNOSIS — Z8614 Personal history of Methicillin resistant Staphylococcus aureus infection: Secondary | ICD-10-CM | POA: Diagnosis not present

## 2019-01-27 DIAGNOSIS — Z79891 Long term (current) use of opiate analgesic: Secondary | ICD-10-CM

## 2019-01-27 DIAGNOSIS — G893 Neoplasm related pain (acute) (chronic): Secondary | ICD-10-CM | POA: Diagnosis present

## 2019-01-27 DIAGNOSIS — I824Z1 Acute embolism and thrombosis of unspecified deep veins of right distal lower extremity: Secondary | ICD-10-CM

## 2019-01-27 DIAGNOSIS — Z515 Encounter for palliative care: Secondary | ICD-10-CM | POA: Diagnosis present

## 2019-01-27 DIAGNOSIS — N182 Chronic kidney disease, stage 2 (mild): Secondary | ICD-10-CM | POA: Diagnosis present

## 2019-01-27 DIAGNOSIS — E860 Dehydration: Secondary | ICD-10-CM | POA: Diagnosis present

## 2019-01-27 DIAGNOSIS — Z960 Presence of urogenital implants: Secondary | ICD-10-CM | POA: Diagnosis present

## 2019-01-27 DIAGNOSIS — E1122 Type 2 diabetes mellitus with diabetic chronic kidney disease: Secondary | ICD-10-CM | POA: Diagnosis present

## 2019-01-27 DIAGNOSIS — B20 Human immunodeficiency virus [HIV] disease: Secondary | ICD-10-CM | POA: Diagnosis present

## 2019-01-27 DIAGNOSIS — Z95828 Presence of other vascular implants and grafts: Secondary | ICD-10-CM

## 2019-01-27 DIAGNOSIS — T8383XA Hemorrhage of genitourinary prosthetic devices, implants and grafts, initial encounter: Secondary | ICD-10-CM

## 2019-01-27 DIAGNOSIS — N1330 Unspecified hydronephrosis: Secondary | ICD-10-CM

## 2019-01-27 DIAGNOSIS — T83022A Displacement of nephrostomy catheter, initial encounter: Secondary | ICD-10-CM | POA: Diagnosis present

## 2019-01-27 DIAGNOSIS — Z66 Do not resuscitate: Secondary | ICD-10-CM | POA: Diagnosis present

## 2019-01-27 DIAGNOSIS — Z21 Asymptomatic human immunodeficiency virus [HIV] infection status: Secondary | ICD-10-CM | POA: Diagnosis present

## 2019-01-27 DIAGNOSIS — N99528 Other complication of other external stoma of urinary tract: Secondary | ICD-10-CM

## 2019-01-27 DIAGNOSIS — N390 Urinary tract infection, site not specified: Secondary | ICD-10-CM

## 2019-01-27 DIAGNOSIS — R059 Cough, unspecified: Secondary | ICD-10-CM

## 2019-01-27 HISTORY — PX: IR NEPHROSTOMY EXCHANGE LEFT: IMG6069

## 2019-01-27 HISTORY — PX: IR NEPHROSTOMY EXCHANGE RIGHT: IMG6070

## 2019-01-27 LAB — LACTIC ACID, PLASMA
Lactic Acid, Venous: 1.5 mmol/L (ref 0.5–1.9)
Lactic Acid, Venous: 0.6 mmol/L (ref 0.5–1.9)

## 2019-01-27 LAB — CBC WITH DIFFERENTIAL/PLATELET
Abs Immature Granulocytes: 0.67 10*3/uL — ABNORMAL HIGH (ref 0.00–0.07)
Basophils Absolute: 0 10*3/uL (ref 0.0–0.1)
Basophils Relative: 0 %
Eosinophils Absolute: 0 10*3/uL (ref 0.0–0.5)
Eosinophils Relative: 0 %
HCT: 17.6 % — ABNORMAL LOW (ref 36.0–46.0)
Hemoglobin: 5 g/dL — CL (ref 12.0–15.0)
Immature Granulocytes: 3 %
Lymphocytes Relative: 6 %
Lymphs Abs: 1.5 10*3/uL (ref 0.7–4.0)
MCH: 29.9 pg (ref 26.0–34.0)
MCHC: 28.4 g/dL — ABNORMAL LOW (ref 30.0–36.0)
MCV: 105.4 fL — ABNORMAL HIGH (ref 80.0–100.0)
Monocytes Absolute: 0.9 10*3/uL (ref 0.1–1.0)
Monocytes Relative: 4 %
NRBC: 0.1 % (ref 0.0–0.2)
Neutro Abs: 21.8 10*3/uL — ABNORMAL HIGH (ref 1.7–7.7)
Neutrophils Relative %: 87 %
Platelets: 605 10*3/uL — ABNORMAL HIGH (ref 150–400)
RBC: 1.67 MIL/uL — ABNORMAL LOW (ref 3.87–5.11)
RDW: 17 % — ABNORMAL HIGH (ref 11.5–15.5)
WBC: 25 10*3/uL — ABNORMAL HIGH (ref 4.0–10.5)

## 2019-01-27 LAB — URINALYSIS, ROUTINE W REFLEX MICROSCOPIC
Bilirubin Urine: NEGATIVE
Glucose, UA: NEGATIVE mg/dL
Ketones, ur: NEGATIVE mg/dL
Nitrite: NEGATIVE
Protein, ur: >=300 mg/dL — AB
SPECIFIC GRAVITY, URINE: 1.017 (ref 1.005–1.030)
WBC, UA: >50 WBC/hpf — ABNORMAL HIGH (ref 0–5)
pH: 8 (ref 5.0–8.0)

## 2019-01-27 LAB — INFLUENZA PANEL BY PCR (TYPE A & B)
Influenza A By PCR: NEGATIVE
Influenza B By PCR: NEGATIVE

## 2019-01-27 LAB — COMPREHENSIVE METABOLIC PANEL
ALT: 9 U/L (ref 0–44)
ANION GAP: 9 (ref 5–15)
AST: 21 U/L (ref 15–41)
Albumin: 2.1 g/dL — ABNORMAL LOW (ref 3.5–5.0)
Alkaline Phosphatase: 132 U/L — ABNORMAL HIGH (ref 38–126)
BUN: 47 mg/dL — ABNORMAL HIGH (ref 6–20)
CO2: 24 mmol/L (ref 22–32)
Calcium: 8.1 mg/dL — ABNORMAL LOW (ref 8.9–10.3)
Chloride: 102 mmol/L (ref 98–111)
Creatinine, Ser: 3.72 mg/dL — ABNORMAL HIGH (ref 0.44–1.00)
GFR calc Af Amer: 15 mL/min — ABNORMAL LOW (ref >60)
GFR calc non Af Amer: 13 mL/min — ABNORMAL LOW (ref >60)
Glucose, Bld: 133 mg/dL — ABNORMAL HIGH (ref 70–99)
POTASSIUM: 4.9 mmol/L (ref 3.5–5.1)
SODIUM: 135 mmol/L (ref 135–145)
Total Bilirubin: 0.6 mg/dL (ref 0.3–1.2)
Total Protein: 7.2 g/dL (ref 6.5–8.1)

## 2019-01-27 LAB — HEMOGLOBIN AND HEMATOCRIT, BLOOD
HCT: 18 % — ABNORMAL LOW (ref 36.0–46.0)
HCT: 23.2 % — ABNORMAL LOW (ref 36.0–46.0)
Hemoglobin: 5.1 g/dL — CL (ref 12.0–15.0)
Hemoglobin: 7.2 g/dL — ABNORMAL LOW (ref 12.0–15.0)

## 2019-01-27 LAB — MRSA PCR SCREENING: MRSA by PCR: NEGATIVE

## 2019-01-27 LAB — PREPARE RBC (CROSSMATCH)

## 2019-01-27 MED ORDER — HYDROMORPHONE HCL 1 MG/ML IJ SOLN
1.0000 mg | INTRAMUSCULAR | Status: DC | PRN
  Administered 2019-01-27 – 2019-01-30 (×11): 1 mg via INTRAVENOUS
  Filled 2019-01-27 (×11): qty 1

## 2019-01-27 MED ORDER — MORPHINE SULFATE (PF) 4 MG/ML IV SOLN
6.0000 mg | Freq: Once | INTRAVENOUS | Status: AC
  Administered 2019-01-27: 6 mg via INTRAVENOUS
  Filled 2019-01-27: qty 2

## 2019-01-27 MED ORDER — SODIUM CHLORIDE 0.9 % IV SOLN
1.0000 g | Freq: Once | INTRAVENOUS | Status: AC
  Administered 2019-01-27: 1 g via INTRAVENOUS
  Filled 2019-01-27: qty 10

## 2019-01-27 MED ORDER — HYDROMORPHONE HCL 1 MG/ML IJ SOLN
1.0000 mg | Freq: Once | INTRAMUSCULAR | Status: AC
  Administered 2019-01-27: 1 mg via INTRAVENOUS
  Filled 2019-01-27: qty 1

## 2019-01-27 MED ORDER — SODIUM CHLORIDE 0.9% IV SOLUTION: Freq: Once | INTRAVENOUS | Status: DC

## 2019-01-27 MED ORDER — HYDROMORPHONE HCL 2 MG PO TABS
1.0000 mg | ORAL_TABLET | ORAL | Status: DC | PRN
  Administered 2019-01-29: 1 mg via ORAL
  Filled 2019-01-27: qty 1

## 2019-01-27 MED ORDER — SODIUM CHLORIDE 0.9 % IV BOLUS
500.0000 mL | Freq: Once | INTRAVENOUS | Status: AC
  Administered 2019-01-27: 500 mL via INTRAVENOUS

## 2019-01-27 MED ORDER — SODIUM CHLORIDE 0.9 % IV SOLN
1.0000 g | Freq: Two times a day (BID) | INTRAVENOUS | Status: DC
  Administered 2019-01-27 – 2019-01-28 (×3): 1 g via INTRAVENOUS
  Filled 2019-01-27 (×5): qty 1

## 2019-01-27 MED ORDER — SODIUM CHLORIDE 0.9% FLUSH
5.0000 mL | Freq: Three times a day (TID) | INTRAVENOUS | Status: DC
  Administered 2019-01-27 – 2019-01-30 (×8): 5 mL

## 2019-01-27 MED ORDER — SODIUM CHLORIDE 0.9% FLUSH
3.0000 mL | Freq: Two times a day (BID) | INTRAVENOUS | Status: DC
  Administered 2019-01-27 – 2019-01-29 (×5): 3 mL via INTRAVENOUS

## 2019-01-27 MED ORDER — MIDAZOLAM HCL 2 MG/2ML IJ SOLN
INTRAMUSCULAR | Status: AC
  Filled 2019-01-27: qty 6

## 2019-01-27 MED ORDER — FENTANYL CITRATE (PF) 100 MCG/2ML IJ SOLN
INTRAMUSCULAR | Status: AC | PRN
  Administered 2019-01-27 (×4): 50 ug via INTRAVENOUS

## 2019-01-27 MED ORDER — SENNOSIDES-DOCUSATE SODIUM 8.6-50 MG PO TABS
2.0000 | ORAL_TABLET | Freq: Two times a day (BID) | ORAL | Status: DC | PRN
  Administered 2019-01-29: 2 via ORAL
  Filled 2019-01-27: qty 2

## 2019-01-27 MED ORDER — SODIUM CHLORIDE 0.9 % IV SOLN
INTRAVENOUS | Status: DC
  Administered 2019-01-27 – 2019-01-30 (×4): via INTRAVENOUS

## 2019-01-27 MED ORDER — METHOCARBAMOL 500 MG PO TABS
500.0000 mg | ORAL_TABLET | Freq: Three times a day (TID) | ORAL | Status: DC | PRN
  Filled 2019-01-27: qty 1

## 2019-01-27 MED ORDER — ONDANSETRON HCL 4 MG/2ML IJ SOLN
4.0000 mg | Freq: Four times a day (QID) | INTRAMUSCULAR | Status: DC | PRN
  Administered 2019-01-28: 4 mg via INTRAVENOUS
  Filled 2019-01-27: qty 2

## 2019-01-27 MED ORDER — IOHEXOL 300 MG/ML  SOLN
50.0000 mL | Freq: Once | INTRAMUSCULAR | Status: AC | PRN
  Administered 2019-01-27: 20 mL

## 2019-01-27 MED ORDER — MIRTAZAPINE 15 MG PO TABS
7.5000 mg | ORAL_TABLET | Freq: Every day | ORAL | Status: DC
  Administered 2019-01-28 – 2019-01-29 (×2): 7.5 mg via ORAL
  Filled 2019-01-27 (×2): qty 1

## 2019-01-27 MED ORDER — BICTEGRAVIR-EMTRICITAB-TENOFOV 50-200-25 MG PO TABS
1.0000 | ORAL_TABLET | Freq: Every day | ORAL | Status: DC
  Administered 2019-01-28 – 2019-01-30 (×3): 1 via ORAL
  Filled 2019-01-27 (×4): qty 1

## 2019-01-27 MED ORDER — MIDAZOLAM HCL 2 MG/2ML IJ SOLN
INTRAMUSCULAR | Status: AC | PRN
  Administered 2019-01-27: 1 mg via INTRAVENOUS
  Administered 2019-01-27: 2 mg via INTRAVENOUS
  Administered 2019-01-27 (×3): 1 mg via INTRAVENOUS

## 2019-01-27 MED ORDER — CLONAZEPAM 0.5 MG PO TABS
0.2500 mg | ORAL_TABLET | Freq: Two times a day (BID) | ORAL | Status: DC
  Administered 2019-01-28 – 2019-01-30 (×5): 0.25 mg via ORAL
  Filled 2019-01-27 (×5): qty 1

## 2019-01-27 MED ORDER — MAGNESIUM OXIDE 400 (241.3 MG) MG PO TABS
400.0000 mg | ORAL_TABLET | Freq: Two times a day (BID) | ORAL | Status: DC
  Administered 2019-01-28 – 2019-01-30 (×5): 400 mg via ORAL
  Filled 2019-01-27 (×5): qty 1

## 2019-01-27 MED ORDER — METHADONE HCL 5 MG PO TABS
5.0000 mg | ORAL_TABLET | Freq: Three times a day (TID) | ORAL | Status: DC
  Administered 2019-01-28 – 2019-01-30 (×7): 5 mg via ORAL
  Filled 2019-01-27 (×7): qty 1

## 2019-01-27 MED ORDER — ACETAMINOPHEN 325 MG PO TABS
650.0000 mg | ORAL_TABLET | Freq: Four times a day (QID) | ORAL | Status: DC | PRN
  Administered 2019-01-28 – 2019-01-29 (×2): 650 mg via ORAL
  Filled 2019-01-27 (×3): qty 2

## 2019-01-27 MED ORDER — ACETAMINOPHEN 650 MG RE SUPP: 650.0000 mg | Freq: Four times a day (QID) | RECTAL | Status: DC | PRN

## 2019-01-27 MED ORDER — FENTANYL CITRATE (PF) 100 MCG/2ML IJ SOLN
INTRAMUSCULAR | Status: AC
  Filled 2019-01-27: qty 4

## 2019-01-27 MED ORDER — SODIUM CHLORIDE 0.9 % IV BOLUS
2000.0000 mL | Freq: Once | INTRAVENOUS | Status: AC
  Administered 2019-01-27: 2000 mL via INTRAVENOUS

## 2019-01-27 MED ORDER — ONDANSETRON HCL 4 MG PO TABS: 4.0000 mg | ORAL_TABLET | Freq: Four times a day (QID) | ORAL | Status: DC | PRN

## 2019-01-27 MED ORDER — LIDOCAINE HCL 1 % IJ SOLN
INTRAMUSCULAR | Status: AC | PRN
  Administered 2019-01-27 (×2): 10 mL via INTRADERMAL

## 2019-01-27 MED ORDER — LIDOCAINE HCL 1 % IJ SOLN
INTRAMUSCULAR | Status: AC
  Filled 2019-01-27: qty 20

## 2019-01-27 NOTE — ED Notes (Signed)
Bed: DK20 Expected date:  Expected time:  Means of arrival:  Comments: 53 yo colostomy bag came off

## 2019-01-27 NOTE — Progress Notes (Signed)
Pt received 1 unit of blood and tolerated with no problems. Upon arriving in lab and  trying to obtain 2nd unit, this writer was informed by lab technician that pt did not have any other unit available and if another unit had to be given, it had to be irradiated. Therefore lab technician advised this writer that she would call whenever blood was ready. Later I received a call from [facility] in the lab to say that they are only allowing a unit a blood a day unless the pt is actively bleeding. Post H & H  Ct had to be done and depending on the result, it would be decided if pt got another unit within the day or not.

## 2019-01-27 NOTE — ED Provider Notes (Signed)
[facility] DEPT Provider Note   CSN: [id number] Arrival date & time: 01/27/19  4166    History   Chief Complaint Chief Complaint  Patient presents with  .  nephrostomy tube came out    HPI Misty Thomas is a 53 y.o. female.     Patient with 2 nephrostomy tubes from cervical cancer.  She is not getting any chemotherapy.  She is a hospice patient patient presented today with fever and 1 of her nephrostomy tubes have come out.  The history is provided by the patient. No language interpreter was used.  Weakness  Severity:  Moderate Onset quality:  Sudden Timing:  Constant Progression:  Worsening Chronicity:  Recurrent Context: not alcohol use   Relieved by:  Nothing Worsened by:  Nothing Ineffective treatments:  None tried Associated symptoms: fever   Associated symptoms: no abdominal pain, no chest pain, no cough, no diarrhea, no frequency, no headaches and no seizures     Past Medical History:  Diagnosis Date  . Acquired pancytopenia ([place]) 03/2018  . Anemia    Mild  . Cancer associated pain   . 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 Chemotherapy induced nausea and vomiting   . Diabetes mellitus type 2, diet-controlled ([place])    pt. denies No meds  . Frequency of urination   . History of cellulitis 2018   bilateral lower leg w/ mrsa  . History of external beam radiation therapy 02-05-2018  to 03-16-2018   cervical cancer  . History of MRSA infection 2018   w/ bilateral lower leg cellulitis  . HIV (human immunodeficiency virus infection) ([place])    asymptomatic  . Hypomagnesemia 03/2018   Severe---- takes oral magnesium and IV magnesium as needed (cancer center)  . Intermittent diarrhea    due to radiation/ chemo  . Nocturia   . Port-A-Cath in place    Power port  . Radiation burn    LOWER ABD.  04-06-2018  per is healing  . Subclinical hypothyroidism    w/ thyroiditis , dx 01-22-2018 PET scan  . Thrombocytopenia ([place])   . Wears glasses     Patient Active Problem List   Diagnosis Date Noted  . Acute blood loss anemia 01/07/2019  . DVT (deep venous thrombosis) ([place]) 01/07/2019  . Nephrostomy tube bleed ([place]) 01/07/2019  . Pressure injury of skin 12/17/2018  . Gram-negative bacteremia 12/17/2018  . Pseudomonas infection 12/17/2018  . Sepsis ([place]) 12/15/2018  . Complicated UTI (urinary tract infection) 12/15/2018  . Acute diarrhea 12/15/2018  . Hyperbilirubinemia 12/15/2018  . Metabolic acidosis 05/06/1600  . Bilateral leg edema 11/26/2018  . Weakness generalized   . DNR (do not resuscitate)   . Perirectal abscess   . Goals of care, counseling/discussion   . AKI (acute kidney injury) ([place]) 10/31/2018  . Palliative care by specialist   . 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 Subclinical hypothyroidism   . Port-A-Cath in place   . History of external beam radiation therapy   . Back pain 10/27/2018  . Presacral pelvic abscess from necrotic cervical cancer & rectovaginal fistula 10/26/2018  . DNR (do not resuscitate) discussion 10/15/2018  . Hydronephrosis 10/10/2018  . Other constipation 07/20/2018  . Preventive measure 07/20/2018  . Routine screening for STI (  sexually transmitted infection) 07/02/2018  . Vaccine counseling 07/02/2018  . Hypomagnesemia 02/22/2018  . Pancytopenia, acquired ([place]) 02/22/2018  . Chemotherapy-induced nausea 02/15/2018  . Diarrhea 02/15/2018  . HIV (human immunodeficiency virus infection) ([place]) 02/09/2018  . Acquired hypothyroidism 02/08/2018  . Anemia, blood loss 02/08/2018  . Abnormal thyroid uptake 01/25/2018  . Cancer associated pain 01/25/2018  . Diabetes mellitus without complication ([place]) 34/28/7681  .  Abnormal vaginal bleeding   . Primary cervical cancer with metastasis - Stage IV 01/04/2018    Past Surgical History:  Procedure Laterality Date  . CYSTOSCOPY     retrograde pyelogram right ureteral stent placement  . CYSTOSCOPY W/ URETERAL STENT PLACEMENT Bilateral 10/24/2018   Procedure: CYSTOSCOPY WITH BILATERAL RETROGRADE PYELOGRAM RIGHT Wyvonnia Dusky STENT PLACEMENT;  Surgeon: Ardis Hughs, MD;  Location: [facility];  Service: Urology;  Laterality: Bilateral;  . EUA/ CERVICAL BX  01-04-2018   dr Ihor Dow  [facility]  . IR FLUORO GUIDE PORT INSERTION RIGHT  02/01/2018  . IR NEPHROSTOMY EXCHANGE LEFT  12/10/2018  . IR NEPHROSTOMY EXCHANGE LEFT  01/10/2019  . IR NEPHROSTOMY EXCHANGE RIGHT  12/10/2018  . IR NEPHROSTOMY EXCHANGE RIGHT  01/10/2019  . IR NEPHROSTOMY PLACEMENT LEFT  11/04/2018  . IR NEPHROSTOMY PLACEMENT RIGHT  11/04/2018  . TANDEM RING INSERTION N/A 03/20/2018   Procedure: TANDEM RING INSERTION;  Surgeon: Gery Pray, MD;  Location: [facility];  Service: Urology;  Laterality: N/A;  . TANDEM RING INSERTION N/A 03/26/2018   Procedure: TANDEM RING INSERTION;  Surgeon: Gery Pray, MD;  Location: [facility];  Service: Urology;  Laterality: N/A;  . TANDEM RING INSERTION N/A 04/04/2018   Procedure: TANDEM RING INSERTION;  Surgeon: Gery Pray, MD;  Location: [facility];  Service: Urology;  Laterality: N/A;  . TANDEM RING INSERTION N/A 04/12/2018   Procedure: TANDEM RING INSERTION;  Surgeon: Gery Pray, MD;  Location: [facility];  Service: Urology;  Laterality: N/A;  . TANDEM RING INSERTION N/A 04/18/2018   Procedure: TANDEM RING INSERTION;  Surgeon: Gery Pray, MD;  Location: [facility];  Service: Urology;  Laterality: N/A;  . TUBAL LIGATION Bilateral 1990s     OB History    Gravida  5   Para  3   Term  3   Preterm      AB  2   Living  3     SAB      TAB  2   Ectopic      Multiple       Live Births               Home Medications    Prior to Admission medications   Medication Sig Start Date End Date Taking? Authorizing Provider  acetaminophen (TYLENOL) 500 MG tablet Take 500 mg by mouth every 6 (six) hours as needed for mild pain.    [provider]  BIKTARVY 50-200-25 MG TABS tablet TAKE 1 TABLET BY MOUTH DAILY. Patient taking differently: Take 1 tablet by mouth daily.  09/14/18   Comer, Okey Regal, MD  clonazePAM (KLONOPIN) 0.5 MG tablet Take 0.5 tablets (0.25 mg total) by mouth 2 (two) times daily. 01/11/19   Shelly Coss, MD  HYDROmorphone (DILAUDID) 2 MG tablet Take 0.5 tablets (1 mg total) by mouth every 3 (three) hours as needed for moderate pain or severe pain. 01/11/19   Shelly Coss, MD  lidocaine-prilocaine (EMLA) cream Apply 1 application topically as needed. 11/15/18   Alvy Bimler,  Ni, MD  magnesium oxide (MAG-OX) 400 (241.3 Mg) MG tablet Take 1 tablet (400 mg total) by mouth 2 (two) times daily. 03/08/18   [name], MD  methadone (DOLOPHINE) 5 MG tablet Take 5 mg by mouth 3 (three) times daily.    [provider]  methocarbamol (ROBAXIN) 500 MG tablet Take 1 tablet (500 mg total) by mouth 3 (three) times daily. 01/11/19   Shelly Coss, MD  mirtazapine (REMERON) 7.5 MG tablet Take 1 tablet (7.5 mg total) by mouth at bedtime. 01/11/19   Shelly Coss, MD  ondansetron (ZOFRAN) 8 MG tablet Take 1 tablet (8 mg total) by mouth every 8 (eight) hours as needed. Patient not taking: Reported on 01/07/2019 12/06/18   [name], MD  senna-docusate (SENOKOT-S) 8.6-50 MG tablet Take 2 tablets by mouth 2 (two) times daily. 01/11/19   Shelly Coss, MD    Family History Family History  Problem Relation Age of Onset  . Cancer Maternal Grandmother        unknown ca    Social History Social History   Tobacco Use  . Smoking status: Never Smoker  . Smokeless tobacco: Never Used  Substance Use Topics  . Alcohol use: Not Currently    Frequency: Never   . Drug use: No     Allergies   Patient has no known allergies.   Review of Systems Review of Systems  Constitutional: Positive for fever. Negative for appetite change and fatigue.  HENT: Negative for congestion, ear discharge and sinus pressure.   Eyes: Negative for discharge.  Respiratory: Negative for cough.   Cardiovascular: Negative for chest pain.  Gastrointestinal: Negative for abdominal pain and diarrhea.  Genitourinary: Negative for frequency and hematuria.  Musculoskeletal: Negative for back pain.  Skin: Negative for rash.  Neurological: Positive for weakness. Negative for seizures and headaches.  Psychiatric/Behavioral: Negative for hallucinations.     Physical Exam Updated Vital Signs BP 115/72 (BP Location: Left Arm)   Pulse (!) 103   Temp 99.5 F (37.5 C) (Rectal)   Resp 18   Ht (S) 5\' 1"  (1.549 m)   Wt (S) 77.1 kg   LMP 02/03/2018   SpO2 100%   BMI 32.12 kg/m   Physical Exam Vitals signs and nursing note reviewed.  Constitutional:      Appearance: She is well-developed.  HENT:     Head: Normocephalic.     Nose: Nose normal.  Eyes:     General: No scleral icterus.    Conjunctiva/sclera: Conjunctivae normal.  Neck:     Musculoskeletal: Neck supple.     Thyroid: No thyromegaly.  Cardiovascular:     Rate and Rhythm: Normal rate and regular rhythm.     Heart sounds: No murmur. No friction rub. No gallop.   Pulmonary:     Breath sounds: No stridor. No wheezing or rales.  Chest:     Chest wall: No tenderness.  Abdominal:     General: There is no distension.     Tenderness: There is no abdominal tenderness. There is no rebound.  Musculoskeletal: Normal range of motion.  Lymphadenopathy:     Cervical: No cervical adenopathy.  Skin:    Findings: No erythema or rash.  Neurological:     Mental Status: She is oriented to person, place, and time.     Motor: No abnormal muscle tone.     Coordination: Coordination normal.  Psychiatric:         Behavior: Behavior normal.  ED Treatments / Results  Labs (all labs ordered are listed, but only abnormal results are displayed) Labs Reviewed  CBC WITH DIFFERENTIAL/PLATELET - Abnormal; Notable for the following components:      Result Value   WBC 25.0 (*)    RBC 1.67 (*)    Hemoglobin 5.0 (*)    HCT 17.6 (*)    MCV 105.4 (*)    MCHC 28.4 (*)    RDW 17.0 (*)    Platelets 605 (*)    Neutro Abs 21.8 (*)    Abs Immature Granulocytes 0.67 (*)    All other components within normal limits  COMPREHENSIVE METABOLIC PANEL - Abnormal; Notable for the following components:   Glucose, Bld 133 (*)    BUN 47 (*)    Creatinine, Ser 3.72 (*)    Calcium 8.1 (*)    Albumin 2.1 (*)    Alkaline Phosphatase 132 (*)    GFR calc non Af Amer 13 (*)    GFR calc Af Amer 15 (*)    All other components within normal limits  URINALYSIS, ROUTINE W REFLEX MICROSCOPIC - Abnormal; Notable for the following components:   Color, Urine AMBER (*)    APPearance CLOUDY (*)    Hgb urine dipstick MODERATE (*)    Protein, ur >=300 (*)    Leukocytes,Ua LARGE (*)    WBC, UA >50 (*)    Bacteria, UA RARE (*)    All other components within normal limits  CULTURE, BLOOD (ROUTINE X 2)  CULTURE, BLOOD (ROUTINE X 2)  URINE CULTURE  LACTIC ACID, PLASMA  LACTIC ACID, PLASMA  TYPE AND SCREEN  PREPARE RBC (CROSSMATCH)    EKG None  Radiology No results found.  Procedures Procedures (including critical care time)  Medications Ordered in ED Medications  0.9 %  sodium chloride infusion (Manually program via Guardrails IV Fluids) ( Intravenous Hold 01/27/19 1024)  morphine 4 MG/ML injection 6 mg (has no administration in time range)  sodium chloride 0.9 % bolus 500 mL (500 mLs Intravenous New Bag/Given 01/27/19 0853)  HYDROmorphone (DILAUDID) injection 1 mg (1 mg Intravenous Given 01/27/19 0945)  sodium chloride 0.9 % bolus 2,000 mL (2,000 mLs Intravenous New Bag/Given 01/27/19 1023)  cefTRIAXone (ROCEPHIN) 1  g in sodium chloride 0.9 % 100 mL IVPB (1 g Intravenous New Bag/Given 01/27/19 1020)     Initial Impression / Assessment and Plan / ED Course  I have reviewed the triage vital signs and the nursing notes.  Pertinent labs & imaging results that were available during my care of the patient were reviewed by me and considered in my medical decision making (see chart for details).        CRITICAL CARE Performed by: Milton Ferguson Total critical care time: 40 minutes Critical care time was exclusive of separately billable procedures and treating other patients. Critical care was necessary to treat or prevent imminent or life-threatening deterioration. Critical care was time spent personally by me on the following activities: development of treatment plan with patient and/or surrogate as well as nursing, discussions with consultants, evaluation of patient's response to treatment, examination of patient, obtaining history from patient or surrogate, ordering and performing treatments and interventions, ordering and review of laboratory studies, ordering and review of radiographic studies, pulse oximetry and re-evaluation of patient's condition. Patient with sepsis and anemia.  She is started on sepsis protocol and given Rocephin.  She is also going to be transfused.  Neurology has been consulted and recommend CT of  the abdomen and possible consulting IR for replacement of nephrostomy tube.  Medicine will admit  Final Clinical Impressions(s) / ED Diagnoses   Final diagnoses:  Acute sepsis [facility])    ED Discharge Orders    None       Milton Ferguson, MD 01/27/19 1055

## 2019-01-27 NOTE — ED Notes (Signed)
Date and time results received: 01/27/19 0908 (use smartphrase ".now" to insert current time)  Test: Hgb Critical Value: 5.0  Name of Provider Notified: Patty, RN  Orders Received? Or Actions Taken?: Actions Taken: Engineer, structural

## 2019-01-27 NOTE — Consult Note (Addendum)
Consultation: nephrostomy tube dislodged, fever, hydronephrosis  Requested by: Dr. Milton Ferguson  History of Present Illness: Misty Thomas is well known to our service after having bilateral nephrostomy tubes placed a few months ago for metastatic cervical cancer and failed right stent placed by Dr. Louis Meckel. She presented today to ED after the left nephrostomy tube was pulled out. She had fever and is anemic as well. Sepsis protocol initiated and I recommended a CT scan when I spoke with Dr. Roderic Palau. CT reveals bilateral hydro and both tubes have pulled out - the left is gone and the right is in the sub-q. The bladder is moderately distended and no clots. Right stent in good position. Pt was seen with Dr. Bonner Puna and we discussed the patient. I reviewed the CT and prior GU notes. Her Cr up to 3.7.   Modifying factors: There are no other modifying factors  Associated signs and symptoms: There are no other associated signs and symptoms Aggravating and relieving factors: There are no other aggravating or relieving factors Severity: Moderate Duration: Persistent  Past Medical History:  Diagnosis Date  . Acquired pancytopenia ([place]) 03/2018  . Anemia    Mild  . Cancer associated pain   . 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 Chemotherapy induced nausea and vomiting   . Diabetes mellitus type 2, diet-controlled ([place])    pt. denies No meds  . Frequency of urination   . History of cellulitis 2018   bilateral lower leg w/ mrsa  . History of external beam radiation therapy 02-05-2018  to 03-16-2018   cervical cancer  . History of MRSA infection 2018   w/ bilateral lower leg cellulitis  . HIV (human immunodeficiency virus infection) ([place])    asymptomatic  .  Hypomagnesemia 03/2018   Severe---- takes oral magnesium and IV magnesium as needed (cancer center)  . Intermittent diarrhea    due to radiation/ chemo  . Nocturia   . Port-A-Cath in place    Power port  . Radiation burn    LOWER ABD.  04-06-2018  per is healing  . Subclinical hypothyroidism    w/ thyroiditis , dx 01-22-2018 PET scan  . Thrombocytopenia ([place])   . Wears glasses    Past Surgical History:  Procedure Laterality Date  . CYSTOSCOPY     retrograde pyelogram right ureteral stent placement  . CYSTOSCOPY W/ URETERAL STENT PLACEMENT Bilateral 10/24/2018   Procedure: CYSTOSCOPY WITH BILATERAL RETROGRADE PYELOGRAM RIGHT Wyvonnia Dusky STENT PLACEMENT;  Surgeon: Ardis Hughs, MD;  Location: [facility];  Service: Urology;  Laterality: Bilateral;  . EUA/ CERVICAL BX  01-04-2018   dr Ihor Dow  [facility]  . IR FLUORO GUIDE PORT INSERTION RIGHT  02/01/2018  . IR NEPHROSTOMY EXCHANGE LEFT  12/10/2018  . IR NEPHROSTOMY EXCHANGE LEFT  01/10/2019  . IR NEPHROSTOMY EXCHANGE LEFT  01/27/2019  . IR NEPHROSTOMY EXCHANGE RIGHT  12/10/2018  . IR NEPHROSTOMY EXCHANGE RIGHT  01/10/2019  . IR NEPHROSTOMY EXCHANGE RIGHT  01/27/2019  . IR NEPHROSTOMY PLACEMENT LEFT  11/04/2018  . IR NEPHROSTOMY PLACEMENT RIGHT  11/04/2018  . TANDEM RING INSERTION N/A 03/20/2018   Procedure: TANDEM RING INSERTION;  Surgeon: Gery Pray, MD;  Location: [facility];  Service: Urology;  Laterality: N/A;  . TANDEM RING INSERTION N/A 03/26/2018  Procedure: TANDEM RING INSERTION;  Surgeon: Gery Pray, MD;  Location: [facility];  Service: Urology;  Laterality: N/A;  . TANDEM RING INSERTION N/A 04/04/2018   Procedure: TANDEM RING INSERTION;  Surgeon: Gery Pray, MD;  Location: [facility];  Service: Urology;  Laterality: N/A;  . TANDEM RING INSERTION N/A 04/12/2018   Procedure: TANDEM RING INSERTION;  Surgeon: Gery Pray, MD;  Location: [facility];  Service: Urology;   Laterality: N/A;  . TANDEM RING INSERTION N/A 04/18/2018   Procedure: TANDEM RING INSERTION;  Surgeon: Gery Pray, MD;  Location: [facility];  Service: Urology;  Laterality: N/A;  . TUBAL LIGATION Bilateral 1990s    Home Medications:  Medications Prior to Admission  Medication Sig Dispense Refill Last Dose  . acetaminophen (TYLENOL) 500 MG tablet Take 500 mg by mouth every 6 (six) hours as needed for mild pain.   01/26/2019 at Unknown time  . BIKTARVY 50-200-25 MG TABS tablet TAKE 1 TABLET BY MOUTH DAILY. (Patient taking differently: Take 1 tablet by mouth daily. ) 30 tablet 5 01/26/2019 at Unknown time  . clonazePAM (KLONOPIN) 0.5 MG tablet Take 0.5 tablets (0.25 mg total) by mouth 2 (two) times daily. 30 tablet 0 01/26/2019 at Unknown time  . HYDROmorphone (DILAUDID) 2 MG tablet Take 0.5 tablets (1 mg total) by mouth every 3 (three) hours as needed for moderate pain or severe pain. 30 tablet 0 01/27/2019 at Unknown time  . lidocaine-prilocaine (EMLA) cream Apply 1 application topically as needed. 30 g 6 Past Month at Unknown time  . magnesium oxide (MAG-OX) 400 (241.3 Mg) MG tablet Take 1 tablet (400 mg total) by mouth 2 (two) times daily. 60 tablet 3 Past Week at Unknown time  . methadone (DOLOPHINE) 5 MG tablet Take 5 mg by mouth 3 (three) times daily.   01/26/2019 at Unknown time  . methocarbamol (ROBAXIN) 500 MG tablet Take 1 tablet (500 mg total) by mouth 3 (three) times daily. (Patient taking differently: Take 500 mg by mouth every 8 (eight) hours as needed for muscle spasms. ) 90 tablet 0 Past Month at Unknown time  . mirtazapine (REMERON) 7.5 MG tablet Take 1 tablet (7.5 mg total) by mouth at bedtime. 30 tablet 0 01/26/2019 at Unknown time  . senna-docusate (SENOKOT-S) 8.6-50 MG tablet Take 2 tablets by mouth 2 (two) times daily. 60 tablet 0 01/26/2019 at Unknown time  . ondansetron (ZOFRAN) 8 MG tablet Take 1 tablet (8 mg total) by mouth every 8 (eight) hours as needed.  (Patient not taking: Reported on 01/07/2019) 60 tablet 3 Not Taking at Unknown time   Allergies: No Known Allergies  Family History  Problem Relation Age of Onset  . Cancer Maternal Grandmother        unknown ca   Social History:  reports that she has never smoked. She has never used smokeless tobacco. She reports previous alcohol use. She reports that she does not use drugs.  ROS: A complete review of systems was performed.  All systems are negative except for pertinent findings as noted. Review of Systems  Constitutional: Positive for fever.  All other systems reviewed and are negative.    Physical Exam:  Vital signs in last 24 hours: Temp:  [99.5 F (37.5 C)-100.2 F (37.9 C)] 99.5 F (37.5 C) (03/22 1002) Pulse Rate:  [81-124] 103 (03/22 1352) Resp:  [12-21] 16 (03/22 1352) BP: (93-146)/(55-99) 100/71 (03/22 1352) SpO2:  [97 %-100 %] 100 % (03/22 1352)  Weight:  [72.5 kg-77.1 kg] 77.1 kg (03/22 0937) General:  Alert and oriented, No acute distress HEENT: Normocephalic, atraumatic Lungs: Regular rate and effort Abdomen: Soft, nontender Back: No CVA tenderness Neurologic: Grossly intact  Laboratory Data:  Results for orders placed or performed during the hospital encounter of 01/27/19 (from the past 24 hour(s))  CBC with Differential/Platelet     Status: Abnormal   Collection Time: 01/27/19  8:50 AM  Result Value Ref Range   WBC 25.0 (H) 4.0 - 10.5 K/uL   RBC 1.67 (L) 3.87 - 5.11 MIL/uL   Hemoglobin 5.0 (LL) 12.0 - 15.0 g/dL   HCT 17.6 (L) 36.0 - 46.0 %   MCV 105.4 (H) 80.0 - 100.0 fL   MCH 29.9 26.0 - 34.0 pg   MCHC 28.4 (L) 30.0 - 36.0 g/dL   RDW 17.0 (H) 11.5 - 15.5 %   Platelets 605 (H) 150 - 400 K/uL   nRBC 0.1 0.0 - 0.2 %   Neutrophils Relative % 87 %   Neutro Abs 21.8 (H) 1.7 - 7.7 K/uL   Lymphocytes Relative 6 %   Lymphs Abs 1.5 0.7 - 4.0 K/uL   Monocytes Relative 4 %   Monocytes Absolute 0.9 0.1 - 1.0 K/uL   Eosinophils Relative 0 %   Eosinophils  Absolute 0.0 0.0 - 0.5 K/uL   Basophils Relative 0 %   Basophils Absolute 0.0 0.0 - 0.1 K/uL   WBC Morphology HYPERSEGMENTED NEUT    Immature Granulocytes 3 %   Abs Immature Granulocytes 0.67 (H) 0.00 - 0.07 K/uL  Comprehensive metabolic panel     Status: Abnormal   Collection Time: 01/27/19  8:50 AM  Result Value Ref Range   Sodium 135 135 - 145 mmol/L   Potassium 4.9 3.5 - 5.1 mmol/L   Chloride 102 98 - 111 mmol/L   CO2 24 22 - 32 mmol/L   Glucose, Bld 133 (H) 70 - 99 mg/dL   BUN 47 (H) 6 - 20 mg/dL   Creatinine, Ser 3.72 (H) 0.44 - 1.00 mg/dL   Calcium 8.1 (L) 8.9 - 10.3 mg/dL   Total Protein 7.2 6.5 - 8.1 g/dL   Albumin 2.1 (L) 3.5 - 5.0 g/dL   AST 21 15 - 41 U/L   ALT 9 0 - 44 U/L   Alkaline Phosphatase 132 (H) 38 - 126 U/L   Total Bilirubin 0.6 0.3 - 1.2 mg/dL   GFR calc non Af Amer 13 (L) >60 mL/min   GFR calc Af Amer 15 (L) >60 mL/min   Anion gap 9 5 - 15  Lactic acid, plasma     Status: None   Collection Time: 01/27/19  9:30 AM  Result Value Ref Range   Lactic Acid, Venous 1.5 0.5 - 1.9 mmol/L  Urinalysis, Routine w reflex microscopic     Status: Abnormal   Collection Time: 01/27/19  9:30 AM  Result Value Ref Range   Color, Urine AMBER (A) YELLOW   APPearance CLOUDY (A) CLEAR   Specific Gravity, Urine 1.017 1.005 - 1.030   pH 8.0 5.0 - 8.0   Glucose, UA NEGATIVE NEGATIVE mg/dL   Hgb urine dipstick MODERATE (A) NEGATIVE   Bilirubin Urine NEGATIVE NEGATIVE   Ketones, ur NEGATIVE NEGATIVE mg/dL   Protein, ur >=300 (A) NEGATIVE mg/dL   Nitrite NEGATIVE NEGATIVE   Leukocytes,Ua LARGE (A) NEGATIVE   RBC / HPF 21-50 0 - 5 RBC/hpf   WBC, UA >50 (H) 0 - 5 WBC/hpf  Bacteria, UA RARE (A) NONE SEEN  Type and screen     Status: None (Preliminary result)   Collection Time: 01/27/19  9:50 AM  Result Value Ref Range   ABO/RH(D) O NEG    Antibody Screen NEG    Sample Expiration      01/30/2019 Performed at [facility], [place] [address]  [place], [place] [address]    Unit Number [id number]    Blood Component Type RBC, LR IRR    Unit division 00    Status of Unit ALLOCATED    Transfusion Status OK TO TRANSFUSE    Crossmatch Result Compatible   Prepare RBC     Status: None   Collection Time: 01/27/19  9:50 AM  Result Value Ref Range   Order Confirmation      ORDER PROCESSED BY BLOOD BANK Performed at [facility], [place] [address], [place], [place] [address]    No results found for this or any previous visit (from the past 240 hour(s)). Creatinine: Recent Labs    01/27/19 0850  CREATININE 3.72*    Impression/Assessment:  Stage IV cervical cancer with bilateral hydronephrosis after both Nx tubes dislodged. Prior failed right stent. Sepsis.   Plan:  Agree with bilateral Nx tube replacement after adequate resuscitation - pt wants to proceed with replacement. I'll notify Dr. Louis Meckel of admission as I believe he was planning to remove the left stent at some point.   Festus Aloe 01/27/2019, 2:33 PM

## 2019-01-27 NOTE — Procedures (Signed)
  Procedure: Bilat perc nephrostomy replacement  Using tracts from prior catheters EBL:   minimal Complications:  none immediate  See full dictation in [facility].  Dillard Cannon MD Main # [phone] Pager  [phone]

## 2019-01-27 NOTE — Progress Notes (Signed)
[facility] 1241-01 [facility]) RN visit at 9211  This is a related and covered GIP admission on 12/19/2018 with a ACC diagnosis of Cervical Cancer per Dr. Gilford Rile. Pt is a full code status. There was a message received that one of the bags kidney drains came off. Triage respond and spoke with the spouse [name] who confirm there was drainage and the bag was loose. Spouse stated he already call 911 who was on the way. Spouse asked if the attempted to reattach and indicated NO he panicked and call 911. Pt was transported via [facility] to [facility] ED for further evaluation. Pt had nephrostomy tube drainage dislodgement, fever and hydronephrosis upon examination. DG chest port view: Impressions right upper lobe pneumonia, Abdominal pelvis: Complete dislodgement of the left nephrostomy tube and right tube is also partially dislodged. Bilateral hydronephrosis is noted on this exam. IR nephrostomy: Successful bilateral percutaneous nephrostomy catheter replaced.  Pt admitted to ICU Progressive Care unit with cc: Dislodgement nephrostomy tube. Denies any pain at the time admission to the floor.   Hospice liaison visited with pt however pt sleeping and no family at bedside. RN attempted to contact pt's spouse [name] at both numbers but only able to lvm. Bedside nurse indicated the spouse has visited the pt today and just left the floor. Pt is tolerating PO intake 240 and meals at 75% with most recent vitals with BP ranging from 93-125/72-99, 109 HR, 17 RR and 100% sats on room air. Pt had a temperature of a temperature 100.2  and it was reduced to 98.2 (ax) at 1535. Nurse has indicated pt is receiving PRBC due to her H/H 5.1 and 18.0.  Per MD notes: Dr. Vance Gather ([facility])  Chief Complaint: Dislodged nephrostomy tube  HPI: Misty MAIRENA is a 53 y.o. female with a history of metastatic cervical cancer, ureteral obstruction s/p ureteral stent, bilateral percutaneous nephrostomy tubes, T2DM, DVT, blood loss  anemia requiring transfusions, currently on home hospice but remains a full code who presented with primary concern of a dislodged nephrostomy tube, also reporting fever, weakness, darkening urine and poor po intake. She reports her dog was near her when she felt a yank on the left tube this morning, so she presented to the ED.   Discharged 3/6 to home on hospice with summary noting she was unable to participate in rehabilitation efforts at SNF and referencing multiple goals of care discussions despite which the patient remains full code. Since discharge she's been eating poorly, staying in bed, noticing subjective fever and severe chills over the past few days. Reports cough, no URI symptoms, headache, shortness of breath, chest pain, palpitations, abdominal pain, nausea, vomiting or diarrhea. She has been taking medications as prescribed including methadone and prn dilaudid for pain, mostly located in the back which is stable.  ED Course: Temperature 100.38F, HR 124bpm, BP 93-125/72-99. Urology consulted, recommending CT to identify tube placement with plan to replace if needed. This showed completed dislodgement of left and partial dislodgement of right nephrostomy tubes with mild hydronephrosis bilaterally, and right ureteral stent in good position. Hgb found to be 5.0 down from 7.7, 3u PRBCs ordered. WBC 25 up from 11.2, lactic acid 1.5, urinalysis with hematuria, bacteriuria and >50WBC/HPF. Ceftriaxone and 2L NS were given after blood and urine cultures collected. IR has been consulted for replacement of PCN. CXR after admission showed RUL pneumonia.  Review of Systems: No sick contacts, no arthralgias, myalgias, rash, denies overt hematuria, but urine is darker and foul  smelling. No melena, hematochezia, vaginal discharge or bleeding, and per HPI. All others reviewed and are negative.   Sepsis due to UTI, RUL PNA: - Given ceftriaxone, will switch to meropenem. Previous urine cultures have  demonstrated polymicrobial infections. 12/09/2018 grew proteus, enterococcus (amp-sensitive), and pseudomonas w/some resistance. Deescalate as soon as possible based on culture data. - Carbapenem should cover pneumonia. MRSA PCR negative.  - Given 2L IVF, continue [place] @ 75cc/hr. - Monitor blood and urine cultures.   Nephrostomy tube dislodgement: Complete on left, partial on right. s/p bilateral PCN exchange 3/5. Also has right ureteral stent. Hydronephrosis resolved on last CT. - Discussed with IR, Dr. Vernard Gambles. Given history of recent removal (today) would expect not to require anesthesia, etc. will attempt replacement today. Pt is NPO and hemodynamically stabilizing. - I discussed with urology, Dr. Junious Silk, who states no further urological recommendations to be made at this time.   Recurrent, symptomatic multifactorial anemia due to acute and chronic blood loss, chemotherapy, and renal failure: Has required transfusions routinely and has ongoing hematuria. - Transfusions ordered per EDP, I spoke with blood bank as she only required 2u previously with hgb 5.3 > 8.3. I believe she is stable enough to transfuse 1u and check H/H, transfuse additionally as necessary to maintain hgb >7g/dl. Do not suspect new/different acute bleeding.   AKI: Due to sepsis, dehydration, possibly postrenal as well with mild hydronephrosis.  - IVF - Avoid nephrotoxins as able.  - Monitor I/O  Metastatic cervical CA with cancer-associated pain: Has undergone palliative chemotherapy per Dr. Alvy Bimler which was poorly tolerated. No further chemotherapy (last Feb 2020) was recommended. - Continue methadone 5mg  TID, dilaudid 1mg  po prn and IV dilaudid prn for breakthrough pain if needed/unable to tolerate po.  - Would appreciate palliative care assistance with symptom management. Pt is on home hospice but remains full code.  HIV:  - Continue biktarvy  Right lower extremity DVT: Not a candidate for anticoagulation with  active bleeding. IVC filter was not recommended as it does not treat the active clot.  DVT prophylaxis: None  Code Status: Full. Discussed this at length with the patient on admission as she's had multiple different statuses in the recent past. She states she wouldn't want to be resuscitated if that didn't provide an acceptable quality of life. I stated I felt that would be the likely outcome. She will discuss with her husband but requests to be full code currently.   Family Communication: None at bedside Disposition Plan: Uncertain Consults called: Urology, Dr. Junious Silk  Admission status: Inpatient  The appropriate admission status for this patient is INPATIENT. Inpatient status is judged to be reasonable and necessary in order to provide the required intensity of service to ensure the patient's safety. The patient's presenting symptoms, physical exam findings, and initial radiographic and laboratory data in the context of their chronic comorbidities is felt to place them at high risk for further clinical deterioration. Furthermore, it is not anticipated that the patient will be medically stable for discharge from the hospital within 2 midnights of admission. The following factors support the admission status of inpatient.    The patient's presenting symptoms include weakness, fever, chills, decreased appetite, poor po intake, decrease urine output.  The worrisome physical exam findings include evident pain, dislodged left nephrostomy tube.  The initial radiographic and laboratory data are worrisome because of sepsis due to fever, leukocytosis, tachycardia, SBP <167mmHg, severe symptomatic anemia hgb 5, hydronephrosis and AKI.  The chronic co-morbidities include metastatic cervical  cancer no longer undergoing therapy, DVT unable to treat with anticoagulation, HIV.  Patient requires inpatient status due to high intensity of service, high risk for further deterioration and high frequency of  surveillance required.  I certify that at the point of admission it is my clinical judgment that the patient will require inpatient hospital care spanning beyond 2 midnights from the point of admission.    ______________________________________________________  Medications: Saline Lock w/ flushes and a chest implant port placed 10/27/18. Pt received Merrem 1 g Na Chloride 0.9% 11ml PIVB.  [facility] Transfer Summary and medication list placed on the shallow chart. Plan of care was discussed with [facility], RN and a attempt to contact the CSW [phone] only able to leave a HIPPA approved voice message requesting a call back.  Goal of care: Symptoms management with no aggressive therapies and she is a FULL CODE.  Communication with [place]: No family present and pt was sleeping at the time of the visit today.  Communication with [facility]: Team update on pt's disposition.  [facility]) will continue to follow while in-patient. Plan would be for pt to return home with Hospice upon discharge.  Please call [facility] [phone] for transportation at time of discharge as [facility] contracts with this services for Hospice patients.  Thank You.  Raina Mina, RN,BSN [facility] Liaison [phone]  [place] are on [place]

## 2019-01-27 NOTE — ED Triage Notes (Signed)
Pt transported from home via [facility]. CC of Nephrostomy tube/bag fell out. Also complaints of diarrhea.  Came home from [facility] yesterday. Cancer pt.   HX - HTN, bradycardia   Temp - 100.3

## 2019-01-27 NOTE — Progress Notes (Signed)
Pharmacy Antibiotic Note  MYSTIC LABO is a 53 y.o. female admitted on 01/27/2019 with fever, weakness, and 1 of her nephrostomy tubes came out. Rocephin 1g was given in the ED. Pharmacy has been consulted for meropenem dosing. SCr elevated.  Plan: Meropenem 1g IV q12h. Follow up renal function, culture results, and clinical course.   Height: (S) 5\' 1"  (154.9 cm) Weight: (S) 169 lb 15.6 oz (77.1 kg) IBW/kg (Calculated) : 47.8  Temp (24hrs), Avg:99.9 F (37.7 C), Min:99.5 F (37.5 C), Max:100.2 F (37.9 C)  Recent Labs  Lab 01/27/19 0850 01/27/19 0930  WBC 25.0*  --   CREATININE 3.72*  --   LATICACIDVEN  --  1.5    Estimated Creatinine Clearance: 16.6 mL/min (A) (by C-G formula based on SCr of 3.72 mg/dL (H)).    No Known Allergies  Antimicrobials this admission: 3/22 CTX x 1 3/22 Meropenem >>  Dose adjustments this admission:  --   Microbiology results: 3/22 BCx x2: 3/22 UCx: Sputum:  MRSA PCR:  Thank you for allowing pharmacy to be a part of this patient's care.  Romeo Rabon, PharmD. Mobile: [phone]. 01/27/2019,11:18 AM.

## 2019-01-27 NOTE — ED Notes (Signed)
ED TO INPATIENT HANDOFF REPORT  ED Nurse Name and Phone #:  [phone] Name/Age/Gender Misty Thomas 53 y.o. female Room/Bed: WA13/WA13  Code Status   Code Status: Prior  Home/SNF/Other Home Patient oriented to: self Is this baseline? Yes   Triage Complete: Triage complete  Chief Complaint nephrostomy tube came out  Triage Note Pt transported from home via [place]. CC of Nephrostomy tube/bag fell out. Also complaints of diarrhea.  Came home from [facility] yesterday. Cancer pt.   HX - HTN, bradycardia   Temp - 100.3    Allergies No Known Allergies  Level of Care/Admitting Diagnosis ED Disposition    ED Disposition Condition [facility]: [place] [[id number]]  Level of Care: Stepdown [14]  Admit to SDU based on following criteria: Hemodynamic compromise or significant risk of instability:  Patient requiring short term acute titration and management of vasoactive drips, and invasive monitoring (i.e., CVP and Arterial line).  Diagnosis: Sepsis [facility]) [[id number]]  Admitting Physician: Patrecia Pour [phone]  Attending Physician: Patrecia Pour [phone]  Estimated length of stay: 3 - 4 days  Certification:: I certify this patient will need inpatient services for at least 2 midnights  PT Class (Do Not Modify): Inpatient [101]  PT Acc Code (Do Not Modify): Private [1]       B Medical/Surgery History Past Medical History:  Diagnosis Date  . Acquired pancytopenia ([place]) 03/2018  . Anemia    Mild  . Cancer associated pain   . 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 Chemotherapy induced nausea and vomiting   . Diabetes mellitus type 2, diet-controlled ([place])    pt. denies No meds  . Frequency of  urination   . History of cellulitis 2018   bilateral lower leg w/ mrsa  . History of external beam radiation therapy 02-05-2018  to 03-16-2018   cervical cancer  . History of MRSA infection 2018   w/ bilateral lower leg cellulitis  . HIV (human immunodeficiency virus infection) ([place])    asymptomatic  . Hypomagnesemia 03/2018   Severe---- takes oral magnesium and IV magnesium as needed (cancer center)  . Intermittent diarrhea    due to radiation/ chemo  . Nocturia   . Port-A-Cath in place    Power port  . Radiation burn    LOWER ABD.  04-06-2018  per is healing  . Subclinical hypothyroidism    w/ thyroiditis , dx 01-22-2018 PET scan  . Thrombocytopenia ([place])   . Wears glasses    Past Surgical History:  Procedure Laterality Date  . CYSTOSCOPY     retrograde pyelogram right ureteral stent placement  . CYSTOSCOPY W/ URETERAL STENT PLACEMENT Bilateral 10/24/2018   Procedure: CYSTOSCOPY WITH BILATERAL RETROGRADE PYELOGRAM RIGHT Wyvonnia Dusky STENT PLACEMENT;  Surgeon: Ardis Hughs, MD;  Location: [facility];  Service: Urology;  Laterality: Bilateral;  . EUA/ CERVICAL BX  01-04-2018   dr Ihor Dow  [facility]  . IR FLUORO GUIDE PORT INSERTION RIGHT  02/01/2018  . IR NEPHROSTOMY EXCHANGE LEFT  12/10/2018  . IR NEPHROSTOMY EXCHANGE LEFT  01/10/2019  . IR NEPHROSTOMY EXCHANGE RIGHT  12/10/2018  . IR NEPHROSTOMY EXCHANGE RIGHT  01/10/2019  . IR NEPHROSTOMY PLACEMENT LEFT  11/04/2018  . IR NEPHROSTOMY  PLACEMENT RIGHT  11/04/2018  . TANDEM RING INSERTION N/A 03/20/2018   Procedure: TANDEM RING INSERTION;  Surgeon: Gery Pray, MD;  Location: [facility];  Service: Urology;  Laterality: N/A;  . TANDEM RING INSERTION N/A 03/26/2018   Procedure: TANDEM RING INSERTION;  Surgeon: Gery Pray, MD;  Location: [facility];  Service: Urology;  Laterality: N/A;  . TANDEM RING INSERTION N/A 04/04/2018   Procedure: TANDEM RING INSERTION;  Surgeon: Gery Pray, MD;  Location:  [facility];  Service: Urology;  Laterality: N/A;  . TANDEM RING INSERTION N/A 04/12/2018   Procedure: TANDEM RING INSERTION;  Surgeon: Gery Pray, MD;  Location: [facility];  Service: Urology;  Laterality: N/A;  . TANDEM RING INSERTION N/A 04/18/2018   Procedure: TANDEM RING INSERTION;  Surgeon: Gery Pray, MD;  Location: [facility];  Service: Urology;  Laterality: N/A;  . TUBAL LIGATION Bilateral 1990s     A IV Location/Drains/Wounds Patient Lines/Drains/Airways Status   Active Line/Drains/Airways    Name:   Placement date:   Placement time:   Site:   Days:   Implanted Port 10/27/18 Chest   10/27/18    -    Chest   92   Peripheral IV 01/27/19 Right;Upper Arm   01/27/19    0953    Arm   less than 1   Nephrostomy Right 10.2 Fr.   01/10/19    1649    Right   17   Nephrostomy Left 10.2 Fr.   01/10/19    1650    Left   17   Pressure Injury 12/16/18 Stage II -  Partial thickness loss of dermis presenting as a shallow open ulcer with a red, pink wound bed without slough.   12/16/18    1328     42          Intake/Output Last 24 hours No intake or output data in the 24 hours ending 01/27/19 1120  Labs/Imaging Results for orders placed or performed during the hospital encounter of 01/27/19 (from the past 48 hour(s))  CBC with Differential/Platelet     Status: Abnormal   Collection Time: 01/27/19  8:50 AM  Result Value Ref Range   WBC 25.0 (H) 4.0 - 10.5 K/uL   RBC 1.67 (L) 3.87 - 5.11 MIL/uL   Hemoglobin 5.0 (LL) 12.0 - 15.0 g/dL    Comment: This critical result has verified and been called to S CLAPP,RN by Marnee Spring on 03 22 2020 at 0909, and has been read back. CRITICAL RESULTS VERIFIED   HCT 17.6 (L) 36.0 - 46.0 %   MCV 105.4 (H) 80.0 - 100.0 fL   MCH 29.9 26.0 - 34.0 pg   MCHC 28.4 (L) 30.0 - 36.0 g/dL   RDW 17.0 (H) 11.5 - 15.5 %   Platelets 605 (H) 150 - 400 K/uL   nRBC 0.1 0.0 - 0.2 %   Neutrophils Relative % 87 %    Neutro Abs 21.8 (H) 1.7 - 7.7 K/uL   Lymphocytes Relative 6 %   Lymphs Abs 1.5 0.7 - 4.0 K/uL   Monocytes Relative 4 %   Monocytes Absolute 0.9 0.1 - 1.0 K/uL   Eosinophils Relative 0 %   Eosinophils Absolute 0.0 0.0 - 0.5 K/uL   Basophils Relative 0 %   Basophils Absolute 0.0 0.0 - 0.1 K/uL   WBC Morphology HYPERSEGMENTED NEUT    Immature Granulocytes 3 %   Abs Immature Granulocytes 0.67 (  H) 0.00 - 0.07 K/uL    Comment: Performed at [facility], [place] [address], [place], [place] [address]  Comprehensive metabolic panel     Status: Abnormal   Collection Time: 01/27/19  8:50 AM  Result Value Ref Range   Sodium 135 135 - 145 mmol/L   Potassium 4.9 3.5 - 5.1 mmol/L   Chloride 102 98 - 111 mmol/L   CO2 24 22 - 32 mmol/L   Glucose, Bld 133 (H) 70 - 99 mg/dL   BUN 47 (H) 6 - 20 mg/dL   Creatinine, Ser 3.72 (H) 0.44 - 1.00 mg/dL   Calcium 8.1 (L) 8.9 - 10.3 mg/dL   Total Protein 7.2 6.5 - 8.1 g/dL   Albumin 2.1 (L) 3.5 - 5.0 g/dL   AST 21 15 - 41 U/L   ALT 9 0 - 44 U/L   Alkaline Phosphatase 132 (H) 38 - 126 U/L   Total Bilirubin 0.6 0.3 - 1.2 mg/dL   GFR calc non Af Amer 13 (L) >60 mL/min   GFR calc Af Amer 15 (L) >60 mL/min   Anion gap 9 5 - 15    Comment: Performed at [facility], [place] [address], [place], [place] [address]  Lactic acid, plasma     Status: None   Collection Time: 01/27/19  9:30 AM  Result Value Ref Range   Lactic Acid, Venous 1.5 0.5 - 1.9 mmol/L    Comment: Performed at [facility], [place] [address] [place], [place] [address]  Urinalysis, Routine w reflex microscopic     Status: Abnormal   Collection Time: 01/27/19  9:30 AM  Result Value Ref Range   Color, Urine AMBER (A) YELLOW    Comment: BIOCHEMICALS MAY BE AFFECTED BY COLOR   APPearance CLOUDY (A) CLEAR   Specific Gravity, Urine 1.017 1.005 - 1.030   pH 8.0 5.0 - 8.0   Glucose, UA NEGATIVE NEGATIVE mg/dL   Hgb urine dipstick MODERATE (A)  NEGATIVE   Bilirubin Urine NEGATIVE NEGATIVE   Ketones, ur NEGATIVE NEGATIVE mg/dL   Protein, ur >=300 (A) NEGATIVE mg/dL   Nitrite NEGATIVE NEGATIVE   Leukocytes,Ua LARGE (A) NEGATIVE   RBC / HPF 21-50 0 - 5 RBC/hpf   WBC, UA >50 (H) 0 - 5 WBC/hpf   Bacteria, UA RARE (A) NONE SEEN    Comment: Performed at [facility], [place] [address], [place], [place] [address]  Type and screen     Status: None (Preliminary result)   Collection Time: 01/27/19  9:50 AM  Result Value Ref Range   ABO/RH(D) O NEG    Antibody Screen NEG    Sample Expiration      01/30/2019 Performed at [facility], [place] [address], [place], [place] [address]    Unit Number [id number]    Blood Component Type RBC, LR IRR    Unit division 00    Status of Unit ALLOCATED    Transfusion Status OK TO TRANSFUSE    Crossmatch Result Compatible   Prepare RBC     Status: None   Collection Time: 01/27/19  9:50 AM  Result Value Ref Range   Order Confirmation      ORDER PROCESSED BY BLOOD BANK Performed at [facility], [place] [address], [place], [place] [address]    No results found.  Pending Labs [employer] (From admission, onward)    Start     Ordered   01/27/19 [phone]  Urine culture  ONCE - STAT,   STAT     01/27/19 0933   01/27/19 0930  Lactic acid, plasma  Now then every 2 hours,   STAT     01/27/19 0929   01/27/19 0930  Blood Culture (routine x 2)  BLOOD CULTURE X 2,   STAT     01/27/19 0929          Vitals/Pain Today's Vitals   01/27/19 1002 01/27/19 1030 01/27/19 1033 01/27/19 1044  BP:  97/72  115/72  Pulse:    (!) 103  Resp:  12  18  Temp: 99.5 F (37.5 C)     TempSrc: Rectal     SpO2:  99%  100%  Weight:      Height:      PainSc:   7      Isolation Precautions No active isolations  Medications Medications  0.9 %  sodium chloride infusion (Manually program via Guardrails IV Fluids) ( Intravenous Hold 01/27/19 1024)  sodium  chloride 0.9 % bolus 500 mL (500 mLs Intravenous New Bag/Given 01/27/19 0853)  HYDROmorphone (DILAUDID) injection 1 mg (1 mg Intravenous Given 01/27/19 0945)  sodium chloride 0.9 % bolus 2,000 mL (2,000 mLs Intravenous New Bag/Given 01/27/19 1023)  cefTRIAXone (ROCEPHIN) 1 g in sodium chloride 0.9 % 100 mL IVPB (1 g Intravenous New Bag/Given 01/27/19 1020)  morphine 4 MG/ML injection 6 mg (6 mg Intravenous Given 01/27/19 1105)    Mobility non-ambulatory High fall risk   Focused Assessments General assessment    R Recommendations: See Admitting Provider Note  Report given to:   Additional Notes:

## 2019-01-27 NOTE — H&P (Addendum)
History and Physical   WOODIE TRUSTY OIZ:[id number] DOB: [birth year] DOA: 01/27/2019  Referring MD/NP/PA: Dr. Roderic Palau, [place] PCP: Patient, No Pcp Per Outpatient Specialists: Oncology, Dr. Alvy Bimler  Patient coming from: Home  Chief Complaint: Dislodged nephrostomy tube  HPI: Misty Thomas is a 53 y.o. female with a history of metastatic cervical cancer, ureteral obstruction s/p ureteral stent, bilateral percutaneous nephrostomy tubes, T2DM, DVT, blood loss anemia requiring transfusions, currently on home hospice but remains a full code who presented with primary concern of a dislodged nephrostomy tube, also reporting fever, weakness, darkening urine and poor po intake. She reports her dog was near her when she felt a yank on the left tube this morning, so she presented to the ED.   Discharged 3/6 to home on hospice with summary noting she was unable to participate in rehabilitation efforts at SNF and referencing multiple goals of care discussions despite which the patient remains full code. Since discharge she's been eating poorly, staying in bed, noticing subjective fever and severe chills over the past few days. Reports cough, no URI symptoms, headache, shortness of breath, chest pain, palpitations, abdominal pain, nausea, vomiting or diarrhea. She has been taking medications as prescribed including methadone and prn dilaudid for pain, mostly located in the back which is stable.  ED Course: Temperature 100.66F, HR 124bpm, BP 93-125/72-99. Urology consulted, recommending CT to identify tube placement with plan to replace if needed. This showed completed dislodgement of left and partial dislodgement of right nephrostomy tubes with mild hydronephrosis bilaterally, and right ureteral stent in good position. Hgb found to be 5.0 down from 7.7, 3u PRBCs ordered. WBC 25 up from 11.2, lactic acid 1.5, urinalysis with hematuria, bacteriuria and >50WBC/HPF. Ceftriaxone and 2L NS were given after blood and urine  cultures collected. IR has been consulted for replacement of PCN. CXR after admission showed RUL pneumonia.  Review of Systems: No sick contacts, no arthralgias, myalgias, rash, denies overt hematuria, but urine is darker and foul smelling. No melena, hematochezia, vaginal discharge or bleeding, and per HPI. All others reviewed and are negative.   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 denies No meds   Frequency of urination    History of cellulitis 2018   bilateral lower leg w/ mrsa   History of external beam radiation therapy 02-05-2018  to 03-16-2018   cervical cancer   History of MRSA infection 2018   w/ bilateral lower leg cellulitis   HIV (human immunodeficiency virus infection) ([place])    asymptomatic   Hypomagnesemia 03/2018   Severe---- takes oral magnesium and IV magnesium as needed (cancer center)   Intermittent diarrhea    due to radiation/ chemo   Nocturia    Port-A-Cath in place    Power port   Radiation burn    LOWER ABD.  04-06-2018  per is healing   Subclinical hypothyroidism    w/ thyroiditis , dx 01-22-2018 PET scan   Thrombocytopenia ([facility])    Wears glasses    Past Surgical History:  Procedure  Laterality Date   CYSTOSCOPY     retrograde pyelogram right ureteral stent placement   CYSTOSCOPY W/ URETERAL STENT PLACEMENT Bilateral 10/24/2018   Procedure: CYSTOSCOPY WITH BILATERAL RETROGRADE PYELOGRAM RIGHT Wyvonnia Dusky STENT PLACEMENT;  Surgeon: Ardis Hughs, MD;  Location: [facility]  [facility];  Service: Urology;  Laterality: Bilateral;   EUA/ CERVICAL BX  01-04-2018   dr Ihor Dow  [facility]   IR [place] RIGHT  02/01/2018   IR NEPHROSTOMY EXCHANGE LEFT  12/10/2018   IR NEPHROSTOMY EXCHANGE LEFT  01/10/2019   IR NEPHROSTOMY EXCHANGE RIGHT  12/10/2018   IR NEPHROSTOMY EXCHANGE RIGHT  01/10/2019   IR NEPHROSTOMY PLACEMENT LEFT  11/04/2018   IR NEPHROSTOMY PLACEMENT RIGHT  11/04/2018   TANDEM RING INSERTION N/A 03/20/2018   Procedure: TANDEM RING INSERTION;  Surgeon: Gery Pray, MD;  Location: [facility];  Service: Urology;  Laterality: N/A;   TANDEM RING INSERTION N/A 03/26/2018   Procedure: TANDEM RING INSERTION;  Surgeon: Gery Pray, MD;  Location: [facility];  Service: Urology;  Laterality: N/A;   TANDEM RING INSERTION N/A 04/04/2018   Procedure: TANDEM RING INSERTION;  Surgeon: Gery Pray, MD;  Location: [facility];  Service: Urology;  Laterality: N/A;   TANDEM RING INSERTION N/A 04/12/2018   Procedure: TANDEM RING INSERTION;  Surgeon: Gery Pray, MD;  Location: [facility];  Service: Urology;  Laterality: N/A;   TANDEM RING INSERTION N/A 04/18/2018   Procedure: TANDEM RING INSERTION;  Surgeon: Gery Pray, MD;  Location: [facility];  Service: Urology;  Laterality: N/A;   TUBAL LIGATION Bilateral 1990s   - Nonsmoker, no current EtOH or other drugs. Lives with husband and is under hospice care.   reports that she has never smoked. She has never used smokeless tobacco. She reports previous alcohol use. She reports that she does not use drugs. No Known Allergies Family History  Problem Relation Age of Onset   Cancer Maternal Grandmother        unknown ca   - Family history otherwise reviewed and not pertinent.  Prior to Admission medications   Medication Sig Start Date End Date Taking? Authorizing Provider  acetaminophen (TYLENOL) 500 MG tablet Take 500 mg by  mouth every 6 (six) hours as needed for mild pain.    [provider]  BIKTARVY 50-200-25 MG TABS tablet TAKE 1 TABLET BY MOUTH DAILY. Patient taking differently: Take 1 tablet by mouth daily.  09/14/18   Comer, Okey Regal, MD  clonazePAM (KLONOPIN) 0.5 MG tablet Take 0.5 tablets (0.25 mg total) by mouth 2 (two) times daily. 01/11/19   Shelly Coss, MD  HYDROmorphone (DILAUDID) 2 MG tablet Take 0.5 tablets (1 mg total) by mouth every 3 (three) hours as needed for moderate pain or severe pain. 01/11/19   Shelly Coss, MD  lidocaine-prilocaine (EMLA) cream Apply 1 application topically as needed. 11/15/18   [name], MD  magnesium oxide (MAG-OX) 400 (241.3 Mg) MG tablet Take 1 tablet (400 mg total) by mouth 2 (two) times daily. 03/08/18   [name], MD  methadone (DOLOPHINE) 5 MG tablet Take 5 mg by mouth 3 (three) times daily.    [provider]  methocarbamol (ROBAXIN) 500 MG tablet Take 1 tablet (500 mg total) by mouth 3 (three) times daily. 01/11/19   Shelly Coss, MD  mirtazapine (REMERON) 7.5 MG tablet Take 1 tablet (7.5 mg total) by mouth at bedtime. 01/11/19   Shelly Coss, MD  ondansetron (  ZOFRAN) 8 MG tablet Take 1 tablet (8 mg total) by mouth every 8 (eight) hours as needed. Patient not taking: Reported on 01/07/2019 12/06/18   [name], MD  senna-docusate (SENOKOT-S) 8.6-50 MG tablet Take 2 tablets by mouth 2 (two) times daily. 01/11/19   Shelly Coss, MD    Physical Exam: Vitals:   01/27/19 1030 01/27/19 1044 01/27/19 1100 01/27/19 1130  BP: 97/72 115/72 93/75 102/72  Pulse:  (!) 103    Resp: 12 18 18 17   Temp:      TempSrc:      SpO2: 99% 100% 99%   Weight:      Height:       Constitutional: 53 y.o. female in evident pain Eyes: Lids and conjunctivae normal, PERRL ENMT: Mucous membranes are moist. Posterior pharynx clear of any exudate or lesions. Fair dentition.  Neck: Normal, supple, no masses, no thyromegaly  Respiratory: Non-labored breathing room  air without accessory muscle use. Clear breath sounds to auscultation bilaterally Cardiovascular: Regular tachycardia, no murmurs, rubs, or gallops. No carotid bruits. No JVD. + L >RLE edema. Palpable pedal pulses. Abdomen: Normoactive bowel sounds. Mild diffuse tenderness, non-distended. GU: Right PCN in place externally without bleeding, discharge. Left PCN dressing c/d/i. Musculoskeletal: No clubbing / cyanosis. No joint deformity upper and lower extremities.  Skin: Warm, dry. No rashes, wounds, or ulcers on visualized skin. Neurologic: CN II-XII grossly intact. Speech normal. No focal deficits in motor strength or sensation in all extremities. Psychiatric: Alert and oriented x3. Intact judgment and insight. Mood anxious with labile affect.   Labs on Admission: I have personally reviewed following labs and imaging studies  CBC: Recent Labs  Lab 01/27/19 0850  WBC 25.0*  NEUTROABS 21.8*  HGB 5.0*  HCT 17.6*  MCV 105.4*  PLT 466*   Basic Metabolic Panel: Recent Labs  Lab 01/27/19 0850  NA 135  K 4.9  CL 102  CO2 24  GLUCOSE 133*  BUN 47*  CREATININE 3.72*  CALCIUM 8.1*   GFR: Estimated Creatinine Clearance: 16.6 mL/min (A) (by C-G formula based on SCr of 3.72 mg/dL (H)). Liver Function Tests: Recent Labs  Lab 01/27/19 0850  AST 21  ALT 9  ALKPHOS 132*  BILITOT 0.6  PROT 7.2  ALBUMIN 2.1*   No results for input(s): LIPASE, AMYLASE in the last 168 hours. No results for input(s): AMMONIA in the last 168 hours. Coagulation Profile: No results for input(s): INR, PROTIME in the last 168 hours. Cardiac Enzymes: No results for input(s): CKTOTAL, CKMB, CKMBINDEX, TROPONINI in the last 168 hours. BNP (last 3 results) No results for input(s): PROBNP in the last 8760 hours. HbA1C: No results for input(s): HGBA1C in the last 72 hours. CBG: No results for input(s): GLUCAP in the last 168 hours. Lipid Profile: No results for input(s): CHOL, HDL, LDLCALC, TRIG, CHOLHDL,  LDLDIRECT in the last 72 hours. Thyroid Function Tests: No results for input(s): TSH, T4TOTAL, FREET4, T3FREE, THYROIDAB in the last 72 hours. Anemia Panel: No results for input(s): VITAMINB12, FOLATE, FERRITIN, TIBC, IRON, RETICCTPCT in the last 72 hours. Urine analysis:    Component Value Date/Time   COLORURINE AMBER (A) 01/27/2019 0930   APPEARANCEUR CLOUDY (A) 01/27/2019 0930   LABSPEC 1.017 01/27/2019 0930   PHURINE 8.0 01/27/2019 0930   GLUCOSEU NEGATIVE 01/27/2019 0930   HGBUR MODERATE (A) 01/27/2019 0930   BILIRUBINUR NEGATIVE 01/27/2019 0930   KETONESUR NEGATIVE 01/27/2019 0930   PROTEINUR >=300 (A) 01/27/2019 0930   UROBILINOGEN 0.2 01/04/2017 1028  NITRITE NEGATIVE 01/27/2019 0930   LEUKOCYTESUR LARGE (A) 01/27/2019 0930    No results found for this or any previous visit (from the past 240 hour(s)).   Radiological Exams on Admission: Ct Abdomen Pelvis Wo Contrast  Result Date: 01/27/2019 CLINICAL DATA:  53 year old female with history of dislodgement of nephrostomy tube. Additional history of diarrhea. EXAM: CT ABDOMEN AND PELVIS WITHOUT CONTRAST TECHNIQUE: Multidetector CT imaging of the abdomen and pelvis was performed following the standard protocol without IV contrast. COMPARISON:  None. FINDINGS: Lower chest: Unremarkable. Hepatobiliary: Mild diffuse low attenuation throughout the visualized hepatic parenchyma, indicative of mild hepatic steatosis. No definite cystic or solid hepatic lesions noted on today's noncontrast CT examination. Unenhanced appearance of the gallbladder is normal. Pancreas: No definite pancreatic mass or peripancreatic fluid collections or inflammatory changes are noted on today's noncontrast CT examination. Spleen: Unremarkable. Adrenals/Urinary Tract: Previously noted left-sided nephrostomy tube has been removed. Right-sided nephrostomy tube also appears dislodged, with the pigtail catheter in the right paraspinal musculature. In the right ureter  there is a right-sided double-J ureteral stent in position, with the proximal loop reformed in the right renal pelvis and distal loop reformed in the urinary bladder. There appears to be some very mild right hydronephrosis at this time. On the left side there also appears to be some mild-to-moderate left hydronephrosis, but no hydroureter. Large low-attenuation lesion measuring 5.5 cm in the upper pole of the left kidney, incompletely characterized on today's noncontrast CT examination, but similar to prior studies, statistically likely a stent. Urinary bladder appears mildly thickened, but is otherwise grossly unremarkable in appearance on today's noncontrast CT examination. Bilateral adrenal glands are normal in appearance. Stomach/Bowel: Unenhanced appearance of the stomach is normal. There is no pathologic dilatation of small bowel or colon. Mural thickening in the region of the rectum, poorly evaluated on today's noncontrast CT examination. Poorly defined fat planes between the rectum in the adjacent cervix also poorly evaluated on today's noncontrast CT examination. Vascular/Lymphatic: No atherosclerotic calcifications in the abdominal aorta. Prominent soft tissue along the pelvic sidewalls in the perirectal regions bilaterally, poorly evaluated on today's noncontrast CT examination. The possibility of lymphadenopathy is not excluded. No abdominal lymphadenopathy confidently identified. Reproductive: Uterus is heterogeneous in appearance with several small lesions, largest of which is partially calcified measuring 3.3 cm on the right side of the fundus, presumably a fibroid. Tissue planes are on the cervix are completely obscured. Ovaries are poorly evaluated on today's noncontrast CT examination, but do not appear enlarged. Other: No significant volume of ascites.  No pneumoperitoneum. Musculoskeletal: There are no aggressive appearing lytic or blastic lesions noted in the visualized portions of the skeleton.  IMPRESSION: 1. Complete dislodgement of the patient's left nephrostomy tube. Right nephrostomy tube is also partially dislodged) currently within the patient's right paraspinal musculature). Bilateral hydronephrosis is noted. 2. Right-sided double-J ureteral stent appears appropriately located. 3. Similar to the prior examination, there is complete obscuration of the tissue planes around the cervix, with thickening of the distal rectum, prominence of surrounding soft tissues of the meso rectum, and prominence of soft tissues along the pelvic sidewall bilaterally in the posterior pelvis, which could reflect either residual tumor or could be in part treatment related from prior radiation therapy. This is poorly evaluated on today's noncontrast CT examination. 4. Mild hepatic steatosis. Electronically Signed   By: Vinnie Langton M.D.   On: 01/27/2019 11:25    EKG: Independently reviewed. Sinus tachycardia, normal axis, isolated TWI in V1 which was present  on ECG 12/15/18. No ischemic changes.   Assessment/Plan Principal Problem:   Sepsis ([place]) Active Problems:   Primary cervical cancer with metastasis - Stage IV   Cancer associated pain   HIV (human immunodeficiency virus infection) ([place])   Hydronephrosis   Port-A-Cath in place   AKI (acute kidney injury) ([place])   Complicated UTI (urinary tract infection)   DVT (deep venous thrombosis) ([facility])   Nephrostomy tube bleed ([place])   Diabetes mellitus type 2, diet-controlled ([place])   Sepsis due to UTI, RUL PNA: - Given ceftriaxone, will switch to meropenem. Previous urine cultures have demonstrated polymicrobial infections. 12/09/2018 grew proteus, enterococcus (amp-sensitive), and pseudomonas w/some resistance. Deescalate as soon as possible based on culture data. - Carbapenem should cover pneumonia. MRSA PCR negative.  - Given 2L IVF, continue [place] @ 75cc/hr. - Monitor blood and urine cultures.   Nephrostomy tube dislodgement: Complete on left, partial on  right. s/p bilateral PCN exchange 3/5. Also has right ureteral stent. Hydronephrosis resolved on last CT. - Discussed with IR, Dr. Vernard Gambles. Given history of recent removal (today) would expect not to require anesthesia, etc. will attempt replacement today. Pt is NPO and hemodynamically stabilizing. - I discussed with urology, Dr. Junious Silk, who states no further urological recommendations to be made at this time.   Recurrent, symptomatic multifactorial anemia due to acute and chronic blood loss, chemotherapy, and renal failure: Has required transfusions routinely and has ongoing hematuria. - Transfusions ordered per EDP, I spoke with blood bank as she only required 2u previously with hgb 5.3 > 8.3. I believe she is stable enough to transfuse 1u and check H/H, transfuse additionally as necessary to maintain hgb >7g/dl. Do not suspect new/different acute bleeding.   AKI: Due to sepsis, dehydration, possibly postrenal as well with mild hydronephrosis.  - IVF - Avoid nephrotoxins as able.  - Monitor I/O  Metastatic cervical CA with cancer-associated pain: Has undergone palliative chemotherapy per Dr. Alvy Bimler which was poorly tolerated. No further chemotherapy (last Feb 2020) was recommended. - Continue methadone 5mg  TID, dilaudid 1mg  po prn and IV dilaudid prn for breakthrough pain if needed/unable to tolerate po.  - Would appreciate palliative care assistance with symptom management. Pt is on home hospice but remains full code.  HIV:  - Continue biktarvy  Right lower extremity DVT: Not a candidate for anticoagulation with active bleeding. IVC filter was not recommended as it does not treat the active clot.  DVT prophylaxis: None  Code Status: Full. Discussed this at length with the patient on admission as she's had multiple different statuses in the recent past. She states she wouldn't want to be resuscitated if that didn't provide an acceptable quality of life. I stated I felt that would be the  likely outcome. She will discuss with her husband but requests to be full code currently.   Family Communication: None at bedside Disposition Plan: Uncertain Consults called: Urology, Dr. Junious Silk  Admission status: Inpatient  The appropriate admission status for this patient is INPATIENT. Inpatient status is judged to be reasonable and necessary in order to provide the required intensity of service to ensure the patient's safety. The patient's presenting symptoms, physical exam findings, and initial radiographic and laboratory data in the context of their chronic comorbidities is felt to place them at high risk for further clinical deterioration. Furthermore, it is not anticipated that the patient will be medically stable for discharge from the hospital within 2 midnights of admission. The following factors support the admission status of inpatient.  The patient's presenting symptoms include weakness, fever, chills, decreased appetite, poor po intake, decrease urine output.  The worrisome physical exam findings include evident pain, dislodged left nephrostomy tube.  The initial radiographic and laboratory data are worrisome because of sepsis due to fever, leukocytosis, tachycardia, SBP <166mmHg, severe symptomatic anemia hgb 5, hydronephrosis and AKI.  The chronic co-morbidities include metastatic cervical cancer no longer undergoing therapy, DVT unable to treat with anticoagulation, HIV.  Patient requires inpatient status due to high intensity of service, high risk for further deterioration and high frequency of surveillance required.  I certify that at the point of admission it is my clinical judgment that the patient will require inpatient hospital care spanning beyond 2 midnights from the point of admission.      Patrecia Pour, MD [facility] [email or web address] Password [facility] 01/27/2019, 12:39 PM

## 2019-01-27 NOTE — ED Notes (Signed)
Patient transported to CT 

## 2019-01-28 DIAGNOSIS — Z515 Encounter for palliative care: Secondary | ICD-10-CM

## 2019-01-28 DIAGNOSIS — C539 Malignant neoplasm of cervix uteri, unspecified: Secondary | ICD-10-CM

## 2019-01-28 DIAGNOSIS — G893 Neoplasm related pain (acute) (chronic): Secondary | ICD-10-CM

## 2019-01-28 DIAGNOSIS — Z7189 Other specified counseling: Secondary | ICD-10-CM

## 2019-01-28 DIAGNOSIS — N99528 Other complication of other external stoma of urinary tract: Secondary | ICD-10-CM

## 2019-01-28 DIAGNOSIS — N179 Acute kidney failure, unspecified: Secondary | ICD-10-CM

## 2019-01-28 DIAGNOSIS — R6520 Severe sepsis without septic shock: Secondary | ICD-10-CM

## 2019-01-28 DIAGNOSIS — A419 Sepsis, unspecified organism: Principal | ICD-10-CM

## 2019-01-28 LAB — BASIC METABOLIC PANEL
Anion gap: 10 (ref 5–15)
BUN: 44 mg/dL — ABNORMAL HIGH (ref 6–20)
CO2: 20 mmol/L — AB (ref 22–32)
Calcium: 7.6 mg/dL — ABNORMAL LOW (ref 8.9–10.3)
Chloride: 109 mmol/L (ref 98–111)
Creatinine, Ser: 2.89 mg/dL — ABNORMAL HIGH (ref 0.44–1.00)
GFR calc Af Amer: 21 mL/min — ABNORMAL LOW (ref >60)
GFR calc non Af Amer: 18 mL/min — ABNORMAL LOW (ref >60)
Glucose, Bld: 80 mg/dL (ref 70–99)
Potassium: 4.6 mmol/L (ref 3.5–5.1)
SODIUM: 139 mmol/L (ref 135–145)

## 2019-01-28 LAB — PREPARE RBC (CROSSMATCH)

## 2019-01-28 LAB — HEMOGLOBIN AND HEMATOCRIT, BLOOD
HCT: 25.8 % — ABNORMAL LOW (ref 36.0–46.0)
Hemoglobin: 8 g/dL — ABNORMAL LOW (ref 12.0–15.0)

## 2019-01-28 LAB — CBC
HCT: 21.8 % — ABNORMAL LOW (ref 36.0–46.0)
Hemoglobin: 6.6 g/dL — CL (ref 12.0–15.0)
MCH: 29.9 pg (ref 26.0–34.0)
MCHC: 30.3 g/dL (ref 30.0–36.0)
MCV: 98.6 fL (ref 80.0–100.0)
Platelets: 519 10*3/uL — ABNORMAL HIGH (ref 150–400)
RBC: 2.21 MIL/uL — ABNORMAL LOW (ref 3.87–5.11)
RDW: 20 % — ABNORMAL HIGH (ref 11.5–15.5)
WBC: 23.1 10*3/uL — ABNORMAL HIGH (ref 4.0–10.5)
nRBC: 0.1 % (ref 0.0–0.2)

## 2019-01-28 LAB — URINE CULTURE: Culture: 40000 — AB

## 2019-01-28 MED ORDER — FLUCONAZOLE IN SODIUM CHLORIDE 200-0.9 MG/100ML-% IV SOLN
200.0000 mg | INTRAVENOUS | Status: DC
  Administered 2019-01-29 – 2019-01-30 (×2): 200 mg via INTRAVENOUS
  Filled 2019-01-28 (×2): qty 100

## 2019-01-28 MED ORDER — CHLORHEXIDINE GLUCONATE CLOTH 2 % EX PADS
6.0000 | MEDICATED_PAD | Freq: Every day | CUTANEOUS | Status: DC
  Administered 2019-01-28 – 2019-01-30 (×3): 6 via TOPICAL

## 2019-01-28 MED ORDER — ORAL CARE MOUTH RINSE
15.0000 mL | Freq: Two times a day (BID) | OROMUCOSAL | Status: DC
  Administered 2019-01-28 – 2019-01-29 (×2): 15 mL via OROMUCOSAL

## 2019-01-28 MED ORDER — FLUCONAZOLE IN SODIUM CHLORIDE 400-0.9 MG/200ML-% IV SOLN
400.0000 mg | Freq: Once | INTRAVENOUS | Status: AC
  Administered 2019-01-28: 400 mg via INTRAVENOUS
  Filled 2019-01-28: qty 200

## 2019-01-28 MED ORDER — SODIUM CHLORIDE 0.9% IV SOLUTION
Freq: Once | INTRAVENOUS | Status: AC
  Administered 2019-01-28: 08:00:00 via INTRAVENOUS

## 2019-01-28 NOTE — Consult Note (Signed)
Consultation Note Date: 01/28/2019   Patient Name: Misty Thomas  DOB: 02/23/1966  MRN: [id number]  Age / Sex: 53 y.o., female  PCP: Patient, No Pcp Per Referring Physician: Cristy Folks, MD  Reason for Consultation: Establishing goals of care  HPI/Patient Profile: 53 y.o. female    admitted on 01/27/2019    Clinical Assessment and Goals of Care:  53 year old with past medical history relevant for HIV on therapy, type 2 diabetes, cancer associated chronic pain, stage IV metastatic cervical cancer complicated by bilateral hydronephrosis status post PCN tubes on palliative care, chronic anemia, DVT not on anticoagulation due to chronic bleeding who presented with dislodged nephrostomy tubes as well as subjective fever, weakness and poor p.o. intake.  Patient known to PMT service, patient elected for continuation of full code and went home with hospice not too long ago.   PMT consult for ongoing goals of care.   Patient resting comfortably, defers to husband.   Chart reviewed in detail, hospice following closely.   Medication history noted, patient on opioids, non opioids and adjuvants for her symptoms. To continue the same.   See below.   NEXT OF KIN  husband Shakti Fleer [phone].   SUMMARY OF RECOMMENDATIONS    Remains full code for now Call placed and discussed with husband Mr Solem about the patient's current condition and overall guarded prognosis, he is thankful for the information provided, and he wants to be called with updates.  Appreciate hospice liaisons following. Patient remains enrolled in home hospice services.   Code Status/Advance Care Planning:  Full code    Symptom Management:    continue current opioid and non opioid regimen. Med history noted.   Palliative Prophylaxis:   Delirium Protocol  Additional Recommendations (Limitations, Scope, Preferences):  Full  Scope Treatment  Psycho-social/Spiritual:   Desire for further Chaplaincy support:yes  Additional Recommendations: Education on Hospice  Prognosis:   < 3 months  Discharge Planning: Home with Hospice      Primary Diagnoses: Present on Admission: . Sepsis ([place]) . AKI (acute kidney injury) ([place]) . Cancer associated pain . DVT (deep venous thrombosis) ([place]) . HIV (human immunodeficiency virus infection) ([place]) . Hydronephrosis . Nephrostomy tube bleed ([place]) . Primary cervical cancer with metastasis - Stage IV . Complicated UTI (urinary tract infection)   I have reviewed the medical record, interviewed the patient and family, and examined the patient. The following aspects are pertinent.  Past Medical History:  Diagnosis Date  . Acquired pancytopenia ([place]) 03/2018  . Anemia    Mild  . Cancer associated pain   . 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 Chemotherapy induced nausea and vomiting   . Diabetes mellitus type 2, diet-controlled ([place])    pt. denies No meds  . Frequency of urination   .  History of cellulitis 2018   bilateral lower leg w/ mrsa  . History of external beam radiation therapy 02-05-2018  to 03-16-2018   cervical cancer  . History of MRSA infection 2018   w/ bilateral lower leg cellulitis  . HIV (human immunodeficiency virus infection) ([place])    asymptomatic  . Hypomagnesemia 03/2018   Severe---- takes oral magnesium and IV magnesium as needed (cancer center)  . Intermittent diarrhea    due to radiation/ chemo  . Nocturia   . Port-A-Cath in place    Power port  . Radiation burn    LOWER ABD.  04-06-2018  per is healing  . Subclinical hypothyroidism    w/ thyroiditis , dx 01-22-2018 PET scan  .  Thrombocytopenia ([place])   . Wears glasses    Social History   Socioeconomic History  . Marital status: Married    Spouse name: [name]  . Number of children: 3  . Years of education: Not on file  . Highest education level: Not on file  Occupational History  . Not on file  Social Needs  . Financial resource strain: Somewhat hard  . Food insecurity:    Worry: Sometimes true    Inability: Sometimes true  . Transportation needs:    Medical: No    Non-medical: No  Tobacco Use  . Smoking status: Never Smoker  . Smokeless tobacco: Never Used  Substance and Sexual Activity  . Alcohol use: Not Currently    Frequency: Never  . Drug use: No  . Sexual activity: Not Currently    Birth control/protection: Surgical  Lifestyle  . Physical activity:    Days per week: 0 days    Minutes per session: 0 min  . Stress: Rather much  Relationships  . Social connections:    Talks on phone: Once a week    Gets together: Once a week    Attends religious service: Never    Active member of club or organization: No    Attends meetings of clubs or organizations: Not on file    Relationship status: Married  Other Topics Concern  . Not on file  Social History Narrative  . Not on file   Family History  Problem Relation Age of Onset  . Cancer Maternal Grandmother        unknown ca   Scheduled Meds: . sodium chloride   Intravenous Once  . bictegravir-emtricitabine-tenofovir AF  1 tablet Oral Daily  . Chlorhexidine Gluconate Cloth  6 each Topical Daily  . clonazePAM  0.25 mg Oral BID  . magnesium oxide  400 mg Oral BID  . mouth rinse  15 mL Mouth Rinse BID  . methadone  5 mg Oral TID  . mirtazapine  7.5 mg Oral QHS  . sodium chloride flush  3 mL Intravenous Q12H  . sodium chloride flush  5 mL Intracatheter Q8H   Continuous Infusions: . sodium chloride 75 mL/hr at 01/28/19 1226  . meropenem (MERREM) IV Stopped (01/28/19 1024)   PRN Meds:.acetaminophen **OR** acetaminophen, HYDROmorphone  (DILAUDID) injection, HYDROmorphone, methocarbamol, ondansetron **OR** ondansetron (ZOFRAN) IV, senna-docusate Medications Prior to Admission:  Prior to Admission medications   Medication Sig Start Date End Date Taking? Authorizing Provider  acetaminophen (TYLENOL) 500 MG tablet Take 500 mg by mouth every 6 (six) hours as needed for mild pain.   Yes [provider]  BIKTARVY 50-200-25 MG TABS tablet TAKE 1 TABLET BY MOUTH DAILY. Patient taking differently: Take 1 tablet by mouth daily.  09/14/18  Yes Comer, Okey Regal, MD  clonazePAM (KLONOPIN) 0.5 MG tablet Take 0.5 tablets (0.25 mg total) by mouth 2 (two) times daily. 01/11/19  Yes Shelly Coss, MD  HYDROmorphone (DILAUDID) 2 MG tablet Take 0.5 tablets (1 mg total) by mouth every 3 (three) hours as needed for moderate pain or severe pain. 01/11/19  Yes Shelly Coss, MD  lidocaine-prilocaine (EMLA) cream Apply 1 application topically as needed. 11/15/18  Yes Gorsuch, Ni, MD  magnesium oxide (MAG-OX) 400 (241.3 Mg) MG tablet Take 1 tablet (400 mg total) by mouth 2 (two) times daily. 03/08/18  Yes Gorsuch, Ni, MD  methadone (DOLOPHINE) 5 MG tablet Take 5 mg by mouth 3 (three) times daily.   Yes [provider]  methocarbamol (ROBAXIN) 500 MG tablet Take 1 tablet (500 mg total) by mouth 3 (three) times daily. Patient taking differently: Take 500 mg by mouth every 8 (eight) hours as needed for muscle spasms.  01/11/19  Yes Shelly Coss, MD  mirtazapine (REMERON) 7.5 MG tablet Take 1 tablet (7.5 mg total) by mouth at bedtime. 01/11/19  Yes Adhikari, Tamsen Meek, MD  senna-docusate (SENOKOT-S) 8.6-50 MG tablet Take 2 tablets by mouth 2 (two) times daily. 01/11/19  Yes Shelly Coss, MD  ondansetron (ZOFRAN) 8 MG tablet Take 1 tablet (8 mg total) by mouth every 8 (eight) hours as needed. Patient not taking: Reported on 01/07/2019 12/06/18   [name], MD   No Known Allergies Review of Systems pain Physical Exam Resting comfortably Appears with  generalized weakness Has edema Regular Flat affect Appears chronically ill  Vital Signs: BP 123/72   Pulse (!) 120   Temp 97.9 F (36.6 C) (Oral)   Resp 15   Ht 5\' 1"  (1.549 m)   Wt 71.3 kg   LMP 02/03/2018   SpO2 100%   BMI 29.70 kg/m  Pain Scale: 0-10   Pain Score: 0-No pain   SpO2: SpO2: 100 % O2 Device:SpO2: 100 % O2 Flow Rate: .O2 Flow Rate (L/min): 2 L/min  IO: Intake/output summary:   Intake/Output Summary (Last 24 hours) at 01/28/2019 1311 Last data filed at 01/28/2019 1218 Gross per 24 hour  Intake 2032.09 ml  Output 600 ml  Net 1432.09 ml    LBM: Last BM Date: 01/27/19 Baseline Weight: Weight: 72.5 kg Most recent weight: Weight: 71.3 kg     Palliative Assessment/Data:   Flowsheet Rows     Most Recent Value  Intake Tab  Referral Department  Hospitalist  Unit at Time of Referral  Intermediate Care Unit  Palliative Care Primary Diagnosis  Cancer  Palliative Care Type  Return patient Palliative Care  Reason for referral  Clarify Goals of Care  Date first seen by Palliative Care  01/28/19  Clinical Assessment  Palliative Performance Scale Score  30%  Pain Max last 24 hours  5  Pain Min Last 24 hours  4  Psychosocial & Spiritual Assessment  Palliative Care Outcomes     PPS 30% Time In:  12 Time Out:  1300 Time Total:  60 Greater than 50%  of this time was spent counseling and coordinating care related to the above assessment and plan.  Signed by: Loistine Chance, MD  [phone] Please contact Palliative Medicine Team phone at [phone] for questions and concerns.  For individual provider: See Shea Evans

## 2019-01-28 NOTE — Progress Notes (Signed)
Procedure note: Right ureteral stent removal at bedside  Patient was prepped and draped in usual standard fashion. Betadine was used to clean the meatus and surrounding tissue. A flexible cystoscope was inserted in the urethra and bladder. There was fairly significant intravesical mucous and debris which made visualization challenging. Ultimately the right ureteral stent was controlled and removed intact with a flexible grasper. This concluded the procedure. The patient tolerated well although was very anxious.   Basilio Cairo, MD

## 2019-01-28 NOTE — Progress Notes (Signed)
Manufacturing engineer [facility]) Hospice GIP RN   This is a related GIP admission, pt is self pay with hospice and [employer] will be her covering payor.  Mrs. Misty Thomas has  an [facility] hospice diagnosis of cervical cancer per Dr. Tomasa Hosteller.  Pt spouse activated EMS after he noticed her nephrostomy tube was leaking.  She is admitted with sepsis and nephrostomy tube dislodgement.  Spoke with pt, she is alert, denies any current discomfort and is talking about what she will order for lunch. She states she feels better than yesterday.  V/S:  97.9 oral, 120/79, HR 106, RR 12, SPO2 100% RA I&O:  1832/600 Lab Work: BUN 44, Cr 2.89, GFR 21, WBC 23.1, RBC 2.21, HGB 6.6, HCT 21.8, platelets 519 Diagnostics:  Portable chest:  FINDINGS:  A right IJ Port-A-Cath is in place. The heart size is normal. Lung volumes are low. Right  upper lobe airspace disease is present.  Problem List: Urosepsis- urine cultures, rocephin 1g IV x 1 RUL PNA- merrem 1g IV q12h Bilateral nephrostomy tube dislodgment:  CT showed hydronephrosis, replaced percutaneously Anemia- chronic, received 3 UPRBCs  IVs, PRNS: NS @ 75, 2.5 liters during ED visit for sepsis, dilaudid 1, IV x 1 (fentanyl 50 mcg x4 & versed 2 gm x 5 for nephrostomy replacement)  IDG:  Discussed  D/C planning:  Return home with hospice support.   Should ambulance transport be required back home, please use [facility] as they contract this service for [facility]  Thank you, Venia Carbon RN, BSN, [facility] Liaison  [phone]

## 2019-01-28 NOTE — Consult Note (Signed)
[place] Nurse wound consult note Reason for Consult:Moisture associated skin damage to perineum, abdominal pannus, partial thickness Wound type: partial thickness MASD Pressure Injury POA: NA Measurement: scattered nonintact skin loss, depth 0.2 cm to perineal skin Abdominal pannus 1 cm x 11 cm x 0.1 cm denuded skin Wound YME:BRAX and moist Drainage (amount, consistency, odor) scant weeping Periwound: moist Dressing procedure/placement/frequency: Cleanse skin to abdominal pannus and perineum with soap and water and pat gently dry.  Apply Interdry AG to skin folds: Measure and cut length of InterDry Ag+ to fit in skin folds that have skin breakdown Tuck InterDry  Ag+ fabric into skin folds in a single layer, allow for 2 inches of overhang from skin edges to allow for wicking to occur May remove to bathe; dry area thoroughly and then tuck into affected areas again Do not apply any creams or ointments when using InterDry Ag+ DO NOT THROW AWAY FOR 5 DAYS unless soiled with stool DO NOT [facility] product, this will inactivate the silver in the material  New sheet of Interdry Ag+ should be applied after 5 days of use if patient continues to have skin breakdown  Will not follow at this time.  Please re-consult if needed.  Domenic Moras MSN, RN, FNP-BC [facility]ON Wound, Ostomy, Continence Nurse Pager [phone]

## 2019-01-28 NOTE — Progress Notes (Signed)
PROGRESS NOTE    Misty Thomas  YJE:[id number] DOB: [birth year] DOA: 01/27/2019 PCP: Patient, No Pcp Per   Brief Narrative:  53 year old with past medical history relevant for HIV on therapy, type 2 diabetes, cancer associated chronic pain, stage IV metastatic cervical cancer complicated by bilateral hydronephrosis status post PCN tubes on palliative care, chronic anemia, DVT not on anticoagulation due to chronic bleeding who presented with dislodged nephrostomy tubes as well as subjective fever, weakness and poor p.o. intake.   Assessment & Plan:   Principal Problem:   Sepsis ([place]) Active Problems:   Primary cervical cancer with metastasis - Stage IV   Cancer associated pain   HIV (human immunodeficiency virus infection) ([place])   Hydronephrosis   Port-A-Cath in place   AKI (acute kidney injury) ([place])   Complicated UTI (urinary tract infection)   DVT (deep venous thrombosis) ([place])   Nephrostomy tube bleed ([place])   Diabetes mellitus type 2, diet-controlled ([place])   #) Subjective fevers/weakness/poor p.o. intake: Possibly secondary to UTI.  Patient has a history of pseudomonal infections. -Follow blood cultures ordered 01/27/2019 -Follow-up urine cultures ordered 01/27/2019 - Continue IV meropenem started 01/27/2019  #) Metastatic stage IV cervical cancer status post bilateral PCN tubes: Patient apparently is not tolerating chemotherapy and was on palliative care.  She continues to remain full code. -Status post PCN tube replacement - Palliative care consulted, patient will reassess CODE STATUS -Continue pain control per below  #) AKI on stage II CKD: Patient presented with AKI in the setting of tube dislodgment and possible urinary tract infection. -IV fluids -Avoid nephrotoxins  #) Acute on chronic blood loss anemia: Patient is status post 2 units packed red blood cells - We will recheck tomorrow  #) Right lower extremity DVT: Not on any treatment due to chronic anemia and  chronic blood loss.  #) HIV: -Continue Biktarvy  #) Diet-controlled diabetes: Regular diet  #) Pain/psych: - Continue clonazepam twice daily -Continue PRN oral hydromorphone -Continue methadone 5 mg 3 times daily -Continue mirtazapine 7.5 mg nightly -Continue PRN methocarbamol  Fluids: Gentle IV fluids Electrolytes: Monitor and supplement Nutrition: Regular diet  Prophylaxis: SCDs  Disposition: Pending improvement in creatinine and stabilization of hemoglobin as well as discussion of goals of care  Full code    Consultants:   Interventional radiology  Urology  Palliative care  Procedures:  Bilateral PCN tube replacement 01/27/2019  Antimicrobials:   Continue meropenem started 01/27/2019   Subjective: This morning the patient reports she is doing much better.  She denies any nausea, vomiting, diarrhea, cough, congestion, abdominal pain.  When addressing her CODE STATUS is she is on hospice she says she will discuss husband.  Objective: Vitals:   01/28/19 0656 01/28/19 0700 01/28/19 0800 01/28/19 0900  BP: 122/71 110/75 106/65 (!) 88/58  Pulse: (!) 116  (!) 116 (!) 116  Resp: 12 (!) 25 20 (!) 21  Temp: 98.9 F (37.2 C)  99.7 F (37.6 C) 99 F (37.2 C)  TempSrc: Oral  Oral Oral  SpO2: 99%  100% 100%  Weight:      Height:        Intake/Output Summary (Last 24 hours) at 01/28/2019 0913 Last data filed at 01/28/2019 0604 Gross per 24 hour  Intake 1832.13 ml  Output 600 ml  Net 1232.13 ml   Filed Weights   01/27/19 0817 01/27/19 0937 01/27/19 1444  Weight: 77.1 kg (S) 77.1 kg 71.3 kg    Examination:  General exam: Appears calm and  comfortable  Respiratory system: Clear to auscultation. Respiratory effort normal. Cardiovascular system: Regular rate and rhythm, no murmurs Gastrointestinal system: Soft, nondistended, no rebound or guarding, plus bowel sounds Central nervous system: Alert and oriented.  Grossly intact, moving all  extremities Extremities: Right greater than left lower extremity edema Skin: PCN tube sites are clean dry and intact, port site is clean dry and intact Psychiatry: Judgement and insight appear normal. Mood & affect flat.    Data Reviewed: I have personally reviewed following labs and imaging studies  CBC: Recent Labs  Lab 01/27/19 0850 01/27/19 1523 01/27/19 1942 01/28/19 0200  WBC 25.0*  --   --  23.1*  NEUTROABS 21.8*  --   --   --   HGB 5.0* 5.1* 7.2* 6.6*  HCT 17.6* 18.0* 23.2* 21.8*  MCV 105.4*  --   --  98.6  PLT 605*  --   --  063*   Basic Metabolic Panel: Recent Labs  Lab 01/27/19 0850 01/28/19 0200  NA 135 139  K 4.9 4.6  CL 102 109  CO2 24 20*  GLUCOSE 133* 80  BUN 47* 44*  CREATININE 3.72* 2.89*  CALCIUM 8.1* 7.6*   GFR: Estimated Creatinine Clearance: 20.6 mL/min (A) (by C-G formula based on SCr of 2.89 mg/dL (H)). Liver Function Tests: Recent Labs  Lab 01/27/19 0850  AST 21  ALT 9  ALKPHOS 132*  BILITOT 0.6  PROT 7.2  ALBUMIN 2.1*   No results for input(s): LIPASE, AMYLASE in the last 168 hours. No results for input(s): AMMONIA in the last 168 hours. Coagulation Profile: No results for input(s): INR, PROTIME in the last 168 hours. Cardiac Enzymes: No results for input(s): CKTOTAL, CKMB, CKMBINDEX, TROPONINI in the last 168 hours. BNP (last 3 results) No results for input(s): PROBNP in the last 8760 hours. HbA1C: No results for input(s): HGBA1C in the last 72 hours. CBG: No results for input(s): GLUCAP in the last 168 hours. Lipid Profile: No results for input(s): CHOL, HDL, LDLCALC, TRIG, CHOLHDL, LDLDIRECT in the last 72 hours. Thyroid Function Tests: No results for input(s): TSH, T4TOTAL, FREET4, T3FREE, THYROIDAB in the last 72 hours. Anemia Panel: No results for input(s): VITAMINB12, FOLATE, FERRITIN, TIBC, IRON, RETICCTPCT in the last 72 hours. Sepsis Labs: Recent Labs  Lab 01/27/19 0930 01/27/19 1523  LATICACIDVEN 1.5 0.6     Recent Results (from the past 240 hour(s))  Blood Culture (routine x 2)     Status: None (Preliminary result)   Collection Time: 01/27/19  9:30 AM  Result Value Ref Range Status   Specimen Description   Final    BLOOD RIGHT ARM Performed at [facility], [place] [address] [place], [place] [address]    Special Requests   Final    BOTTLES DRAWN AEROBIC AND ANAEROBIC Blood Culture results may not be optimal due to an excessive volume of blood received in culture bottles Performed at [facility] [address] [place], [place] [address]    Culture   Final    NO GROWTH < 12 HOURS Performed at [facility] [address] [place], [place] [address]    Report Status PENDING  Incomplete  Urine culture     Status: None (Preliminary result)   Collection Time: 01/27/19  9:34 AM  Result Value Ref Range Status   Specimen Description   Final    URINE, CLEAN CATCH Performed at [facility], [place] [address], [place], [place] [address]  Special Requests   Final    NONE Performed at [facility], [place] [address], [place], [place] [address]    Culture   Final    CULTURE REINCUBATED FOR BETTER GROWTH Performed at [facility], [place] [address], [place], [place] [address]    Report Status PENDING  Incomplete  Blood Culture (routine x 2)     Status: None (Preliminary result)   Collection Time: 01/27/19  9:35 AM  Result Value Ref Range Status   Specimen Description   Final    PORTA CATH Performed at [facility] [address] [place], [place] [address]    Special Requests   Final    BOTTLES DRAWN AEROBIC AND ANAEROBIC Blood Culture adequate volume Performed at [facility] [address] [place], [place] [address]    Culture   Final    NO GROWTH < 12 HOURS Performed at [facility] [address], [place], [place] [address]    Report Status PENDING  Incomplete   MRSA PCR Screening     Status: None   Collection Time: 01/27/19  1:30 PM  Result Value Ref Range Status   MRSA by PCR NEGATIVE NEGATIVE Final    Comment:        The GeneXpert MRSA Assay (FDA approved for NASAL specimens only), is one component of a comprehensive MRSA colonization surveillance program. It is not intended to diagnose MRSA infection nor to guide or monitor treatment for MRSA infections. Performed at [facility], [place] [address] [place], [place] [address]          Radiology Studies: Ct Abdomen Pelvis Wo Contrast  Result Date: 01/27/2019 CLINICAL DATA:  53 year old female with history of dislodgement of nephrostomy tube. Additional history of diarrhea. EXAM: CT ABDOMEN AND PELVIS WITHOUT CONTRAST TECHNIQUE: Multidetector CT imaging of the abdomen and pelvis was performed following the standard protocol without IV contrast. COMPARISON:  None. FINDINGS: Lower chest: Unremarkable. Hepatobiliary: Mild diffuse low attenuation throughout the visualized hepatic parenchyma, indicative of mild hepatic steatosis. No definite cystic or solid hepatic lesions noted on today's noncontrast CT examination. Unenhanced appearance of the gallbladder is normal. Pancreas: No definite pancreatic mass or peripancreatic fluid collections or inflammatory changes are noted on today's noncontrast CT examination. Spleen: Unremarkable. Adrenals/Urinary Tract: Previously noted left-sided nephrostomy tube has been removed. Right-sided nephrostomy tube also appears dislodged, with the pigtail catheter in the right paraspinal musculature. In the right ureter there is a right-sided double-J ureteral stent in position, with the proximal loop reformed in the right renal pelvis and distal loop reformed in the urinary bladder. There appears to be some very mild right hydronephrosis at this time. On the left side there also appears to be some mild-to-moderate left hydronephrosis, but no  hydroureter. Large low-attenuation lesion measuring 5.5 cm in the upper pole of the left kidney, incompletely characterized on today's noncontrast CT examination, but similar to prior studies, statistically likely a stent. Urinary bladder appears mildly thickened, but is otherwise grossly unremarkable in appearance on today's noncontrast CT examination. Bilateral adrenal glands are normal in appearance. Stomach/Bowel: Unenhanced appearance of the stomach is normal. There is no pathologic dilatation of small bowel or colon. Mural thickening in the region of the rectum, poorly evaluated on today's noncontrast CT examination. Poorly defined fat planes between the rectum in the adjacent cervix also poorly evaluated on today's noncontrast CT examination. Vascular/Lymphatic: No atherosclerotic calcifications in the abdominal aorta. Prominent soft tissue  along the pelvic sidewalls in the perirectal regions bilaterally, poorly evaluated on today's noncontrast CT examination. The possibility of lymphadenopathy is not excluded. No abdominal lymphadenopathy confidently identified. Reproductive: Uterus is heterogeneous in appearance with several small lesions, largest of which is partially calcified measuring 3.3 cm on the right side of the fundus, presumably a fibroid. Tissue planes are on the cervix are completely obscured. Ovaries are poorly evaluated on today's noncontrast CT examination, but do not appear enlarged. Other: No significant volume of ascites.  No pneumoperitoneum. Musculoskeletal: There are no aggressive appearing lytic or blastic lesions noted in the visualized portions of the skeleton. IMPRESSION: 1. Complete dislodgement of the patient's left nephrostomy tube. Right nephrostomy tube is also partially dislodged) currently within the patient's right paraspinal musculature). Bilateral hydronephrosis is noted. 2. Right-sided double-J ureteral stent appears appropriately located. 3. Similar to the prior  examination, there is complete obscuration of the tissue planes around the cervix, with thickening of the distal rectum, prominence of surrounding soft tissues of the meso rectum, and prominence of soft tissues along the pelvic sidewall bilaterally in the posterior pelvis, which could reflect either residual tumor or could be in part treatment related from prior radiation therapy. This is poorly evaluated on today's noncontrast CT examination. 4. Mild hepatic steatosis. Electronically Signed   By: Vinnie Langton M.D.   On: 01/27/2019 11:25   Dg Chest Port 1 View  Result Date: 01/27/2019 CLINICAL DATA:  Cough and fever. Bilateral nephrostomy tubes replaced today. EXAM: PORTABLE CHEST 1 VIEW COMPARISON:  One-view chest x-ray 12/15/2018 FINDINGS: A right IJ Port-A-Cath is in place. The heart size is normal. Lung volumes are low. Right upper lobe airspace disease is present. IMPRESSION: Right upper lobe pneumonia. Electronically Signed   By: San Morelle M.D.   On: 01/27/2019 14:38   Ir Nephrostomy Exchange Left  Result Date: 01/27/2019 CLINICAL DATA:  Cervical carcinoma with ureteral obstruction. Bilateral percutaneous nephrostomy catheters placed 11/04/2018, exchanged most recently 01/10/2019. Catheter pulled out. CT demonstrates complete dislodgement of the left tube, right tube displacement into the paraspinal musculature. Severe anemia. EXAM: RIGHT PERCUTANEOUS NEPHROSTOMY CATHETER EXCHANGE UNDER FLUOROSCOPY LEFT PERCUTANEOUS NEPHROSTOMY CATHETER PLACEMENT UNDER FLUOROSCOPIC GUIDANCE FLUOROSCOPY TIME:  6 MINUTES; [place]: The procedure, risks (including but not limited to bleeding, infection, organ damage ), benefits, and alternatives were explained to the patient. Questions regarding the procedure were encouraged and answered. The patient understands and consents to the procedure. Bilateralflank regions prepped with Betadine, draped in usual sterile fashion, infiltrated locally with 1%  lidocaine. Intravenous Fentanyl 219mcg and Versed 6mg  were administered as conscious sedation during continuous monitoring of the patient's level of consciousness and physiological / cardiorespiratory status by the radiology RN, with a total moderate sedation time of 22 minutes. Under fluoroscopy, a short 5 [place] Kumpe catheter was advanced through the tract of the previously placed left nephrostomy catheter, using small contrast injections under fluoroscopy for guidance, until access to the renal collecting system was achieved. The catheter was exchanged over a Careers adviser for [employer] vascular dilator, which facilitated advancement of a 10 French pigtail catheter, formed centrally within the left renal collecting system. Contrast injection confirmed appropriate positioning. Under fluoroscopy, existing right catheter was injected, filling only a small cavity around the catheter, with no evident tract or communication to the renal collecting system. After the region was infiltrated with 1% lidocaine, the catheter was cut and exchanged over a Bentson wire for the Kumpe catheter. With the aid of an angled Glidewire  and gentle probing, the tract was negotiated to the right renal collecting system. Small contrast injection confirmed appropriate positioning. The Kumpe was exchanged over a Bentson wire for 10 French vascular dilator, which facilitated placement of a 10 French pigtail drain catheter, formed centrally within the right renal collecting system. Contrast injection confirmed appropriate positioning. Both catheters were secured externally with 0 Prolene sutures and StatLock. Both were placed to gravity drainage. The patient tolerated the procedure well. COMPLICATIONS: COMPLICATIONS none IMPRESSION: 1. Technically successful bilateral percutaneous nephrostomy catheter replacement. Electronically Signed   By: Lucrezia Europe M.D.   On: 01/27/2019 14:17   Ir Nephrostomy Exchange Right  Result Date:  01/27/2019 CLINICAL DATA:  Cervical carcinoma with ureteral obstruction. Bilateral percutaneous nephrostomy catheters placed 11/04/2018, exchanged most recently 01/10/2019. Catheter pulled out. CT demonstrates complete dislodgement of the left tube, right tube displacement into the paraspinal musculature. Severe anemia. EXAM: RIGHT PERCUTANEOUS NEPHROSTOMY CATHETER EXCHANGE UNDER FLUOROSCOPY LEFT PERCUTANEOUS NEPHROSTOMY CATHETER PLACEMENT UNDER FLUOROSCOPIC GUIDANCE FLUOROSCOPY TIME:  6 MINUTES; [place]: The procedure, risks (including but not limited to bleeding, infection, organ damage ), benefits, and alternatives were explained to the patient. Questions regarding the procedure were encouraged and answered. The patient understands and consents to the procedure. Bilateralflank regions prepped with Betadine, draped in usual sterile fashion, infiltrated locally with 1% lidocaine. Intravenous Fentanyl 251mcg and Versed 6mg  were administered as conscious sedation during continuous monitoring of the patient's level of consciousness and physiological / cardiorespiratory status by the radiology RN, with a total moderate sedation time of 22 minutes. Under fluoroscopy, a short 5 [place] Kumpe catheter was advanced through the tract of the previously placed left nephrostomy catheter, using small contrast injections under fluoroscopy for guidance, until access to the renal collecting system was achieved. The catheter was exchanged over a Careers adviser for [employer] vascular dilator, which facilitated advancement of a 10 French pigtail catheter, formed centrally within the left renal collecting system. Contrast injection confirmed appropriate positioning. Under fluoroscopy, existing right catheter was injected, filling only a small cavity around the catheter, with no evident tract or communication to the renal collecting system. After the region was infiltrated with 1% lidocaine, the catheter was cut and exchanged  over a Bentson wire for the Kumpe catheter. With the aid of an angled Glidewire and gentle probing, the tract was negotiated to the right renal collecting system. Small contrast injection confirmed appropriate positioning. The Kumpe was exchanged over a Bentson wire for 10 French vascular dilator, which facilitated placement of a 10 French pigtail drain catheter, formed centrally within the right renal collecting system. Contrast injection confirmed appropriate positioning. Both catheters were secured externally with 0 Prolene sutures and StatLock. Both were placed to gravity drainage. The patient tolerated the procedure well. COMPLICATIONS: COMPLICATIONS none IMPRESSION: 1. Technically successful bilateral percutaneous nephrostomy catheter replacement. Electronically Signed   By: Lucrezia Europe M.D.   On: 01/27/2019 14:17        Scheduled Meds:  sodium chloride   Intravenous Once   bictegravir-emtricitabine-tenofovir AF  1 tablet Oral Daily   Chlorhexidine Gluconate Cloth  6 each Topical Daily   clonazePAM  0.25 mg Oral BID   magnesium oxide  400 mg Oral BID   mouth rinse  15 mL Mouth Rinse BID   methadone  5 mg Oral TID   mirtazapine  7.5 mg Oral QHS   sodium chloride flush  3 mL Intravenous Q12H   sodium chloride flush  5 mL Intracatheter Q8H   Continuous  Infusions:  sodium chloride 75 mL/hr at 01/28/19 0828   meropenem (MERREM) IV Stopped (01/27/19 2314)     LOS: 1 day    Time spent: [place], MD [facility]  If 7PM-7AM, please contact night-coverage [email or web address] Password [facility] 01/28/2019, 9:13 AM

## 2019-01-28 NOTE — Progress Notes (Signed)
CRITICAL VALUE ALERT  Critical Value:  Hgb 6.6  Date & Time Notied:  3/34/2020 0500  Provider Notified: [place]   Orders Received/Actions taken: no new orders at this time

## 2019-01-29 LAB — CBC
HCT: 26.2 % — ABNORMAL LOW (ref 36.0–46.0)
Hemoglobin: 8.1 g/dL — ABNORMAL LOW (ref 12.0–15.0)
MCH: 29.7 pg (ref 26.0–34.0)
MCHC: 30.9 g/dL (ref 30.0–36.0)
MCV: 96 fL (ref 80.0–100.0)
Platelets: 506 10*3/uL — ABNORMAL HIGH (ref 150–400)
RBC: 2.73 MIL/uL — ABNORMAL LOW (ref 3.87–5.11)
RDW: 19.6 % — ABNORMAL HIGH (ref 11.5–15.5)
WBC: 21.2 10*3/uL — ABNORMAL HIGH (ref 4.0–10.5)
nRBC: 0 % (ref 0.0–0.2)

## 2019-01-29 LAB — TYPE AND SCREEN
ABO/RH(D): O NEG
Antibody Screen: NEGATIVE
Unit division: 0
Unit division: 0

## 2019-01-29 LAB — BPAM RBC
Blood Product Expiration Date: 202004122359
Blood Product Expiration Date: 202004192359
ISSUE DATE / TIME: 202003221504
ISSUE DATE / TIME: 202003230906
UNIT TYPE AND RH: 9500
Unit Type and Rh: 9500

## 2019-01-29 LAB — PROCALCITONIN: Procalcitonin: 0.73 ng/mL

## 2019-01-29 LAB — BASIC METABOLIC PANEL WITH GFR
BUN: 38 mg/dL — ABNORMAL HIGH (ref 6–20)
Creatinine, Ser: 2.3 mg/dL — ABNORMAL HIGH (ref 0.44–1.00)

## 2019-01-29 LAB — MAGNESIUM: Magnesium: 2.3 mg/dL (ref 1.7–2.4)

## 2019-01-29 LAB — BASIC METABOLIC PANEL
Anion gap: 9 (ref 5–15)
CO2: 21 mmol/L — ABNORMAL LOW (ref 22–32)
Calcium: 7.8 mg/dL — ABNORMAL LOW (ref 8.9–10.3)
Chloride: 111 mmol/L (ref 98–111)
GFR calc Af Amer: 27 mL/min — ABNORMAL LOW (ref >60)
GFR calc non Af Amer: 24 mL/min — ABNORMAL LOW (ref >60)
Glucose, Bld: 74 mg/dL (ref 70–99)
Potassium: 4.2 mmol/L (ref 3.5–5.1)
Sodium: 141 mmol/L (ref 135–145)

## 2019-01-29 MED ORDER — ADULT MULTIVITAMIN W/MINERALS CH
1.0000 | ORAL_TABLET | Freq: Every day | ORAL | Status: DC
  Administered 2019-01-29 – 2019-01-30 (×2): 1 via ORAL
  Filled 2019-01-29 (×2): qty 1

## 2019-01-29 MED ORDER — BOOST / RESOURCE BREEZE PO LIQD CUSTOM
1.0000 | Freq: Two times a day (BID) | ORAL | Status: DC
  Administered 2019-01-29: 1 via ORAL

## 2019-01-29 MED ORDER — ENSURE ENLIVE PO LIQD
237.0000 mL | Freq: Two times a day (BID) | ORAL | Status: DC
  Administered 2019-01-29 (×2): 237 mL via ORAL

## 2019-01-29 MED ORDER — SODIUM CHLORIDE 0.9% FLUSH
10.0000 mL | INTRAVENOUS | Status: DC | PRN
  Administered 2019-01-30: 10 mL
  Filled 2019-01-29: qty 40

## 2019-01-29 MED ORDER — HYDROMORPHONE HCL 2 MG PO TABS
4.0000 mg | ORAL_TABLET | ORAL | Status: DC | PRN
  Administered 2019-01-29 – 2019-01-30 (×2): 4 mg via ORAL
  Filled 2019-01-29 (×4): qty 2

## 2019-01-29 MED ORDER — LACTATED RINGERS IV BOLUS
1000.0000 mL | Freq: Once | INTRAVENOUS | Status: AC
  Administered 2019-01-29: 1000 mL via INTRAVENOUS

## 2019-01-29 MED ORDER — SODIUM CHLORIDE 0.9% FLUSH
10.0000 mL | Freq: Two times a day (BID) | INTRAVENOUS | Status: DC
  Administered 2019-01-29 – 2019-01-30 (×3): 10 mL

## 2019-01-29 NOTE — Progress Notes (Signed)
Daily Progress Note   Patient Name: Misty Thomas       Date: 01/29/2019 DOB: Oct 07, 1966  Age: 53 y.o. MRN#: [id number] Attending Physician: Cristy Folks, MD Primary Care Physician: Patient, No Pcp Per Admit Date: 01/27/2019  Reason for Consultation/Follow-up: Establishing goals of care and Pain control  Subjective: I saw and examined Misty Thomas today.  She reports pain currently well controlled.  Attempted to discuss further with her regarding long term goals and prognosis, however, she became immediately tearful and declined to discuss.    I called and discussed with her husband her multiple problems, including elevated Cr, elevated WBC, and decreased Hgb with need for transfusion last night.  He reports understanding that she continues to worsen, but he is trying to figure out how to best support her as she declines.  Length of Stay: 2  Current Medications: Scheduled Meds:  . sodium chloride   Intravenous Once  . bictegravir-emtricitabine-tenofovir AF  1 tablet Oral Daily  . Chlorhexidine Gluconate Cloth  6 each Topical Daily  . clonazePAM  0.25 mg Oral BID  . feeding supplement  1 Container Oral BID BM  . feeding supplement (ENSURE ENLIVE)  237 mL Oral BID BM  . magnesium oxide  400 mg Oral BID  . mouth rinse  15 mL Mouth Rinse BID  . methadone  5 mg Oral TID  . mirtazapine  7.5 mg Oral QHS  . multivitamin with minerals  1 tablet Oral Daily  . sodium chloride flush  10-40 mL Intracatheter Q12H  . sodium chloride flush  3 mL Intravenous Q12H  . sodium chloride flush  5 mL Intracatheter Q8H    Continuous Infusions: . sodium chloride Stopped (01/28/19 1556)  . fluconazole (DIFLUCAN) IV 200 mg (01/29/19 0957)    PRN Meds: acetaminophen **OR** acetaminophen, HYDROmorphone  (DILAUDID) injection, HYDROmorphone, methocarbamol, ondansetron **OR** ondansetron (ZOFRAN) IV, senna-docusate, sodium chloride flush  Physical Exam         General: Alert, awake, in no acute distress. Tearful at times. Heart: Tachycardic. No murmur appreciated. Lungs: Fair air movement, clear Abdomen: Soft, nontender Ext: No significant edema Skin: Warm and dry Neuro: Grossly intact, nonfocal.  Vital Signs: BP 97/67   Pulse (!) 117   Temp 98.8 F (37.1 C) (Oral)   Resp 18   Ht 5'  1" (1.549 m)   Wt 71.3 kg   LMP 02/03/2018   SpO2 97%   BMI 29.70 kg/m  SpO2: SpO2: 97 % O2 Device: O2 Device: Room Air O2 Flow Rate: O2 Flow Rate (L/min): 2 L/min  Intake/output summary:   Intake/Output Summary (Last 24 hours) at 01/29/2019 1444 Last data filed at 01/29/2019 0600 Gross per 24 hour  Intake 2456.66 ml  Output 800 ml  Net 1656.66 ml   LBM: Last BM Date: (P) 01/29/19 Baseline Weight: Weight: 72.5 kg Most recent weight: Weight: 71.3 kg       Palliative Assessment/Data:    Flowsheet Rows     Most Recent Value  Intake Tab  Referral Department  Hospitalist  Unit at Time of Referral  Intermediate Care Unit  Palliative Care Primary Diagnosis  Cancer  Palliative Care Type  Return patient Palliative Care  Reason for referral  Clarify Goals of Care  Date first seen by Palliative Care  01/28/19  Clinical Assessment  Palliative Performance Scale Score  30%  Pain Max last 24 hours  5  Pain Min Last 24 hours  4  Psychosocial & Spiritual Assessment  Palliative Care Outcomes      Patient Active Problem List   Diagnosis Date Noted  . Nephrostomy complication ([place])   . Diabetes mellitus type 2, diet-controlled ([place]) 01/27/2019  . Acute blood loss anemia 01/07/2019  . DVT (deep venous thrombosis) ([place]) 01/07/2019  . Nephrostomy tube bleed ([place]) 01/07/2019  . Pressure injury of skin 12/17/2018  . Gram-negative bacteremia 12/17/2018  . Pseudomonas infection 12/17/2018  .  Sepsis ([place]) 12/15/2018  . Complicated UTI (urinary tract infection) 12/15/2018  . Acute diarrhea 12/15/2018  . Hyperbilirubinemia 12/15/2018  . Metabolic acidosis 53/66/4403  . Bilateral leg edema 11/26/2018  . Weakness generalized   . DNR (do not resuscitate)   . Perirectal abscess   . Goals of care, counseling/discussion   . AKI (acute kidney injury) ([place]) 10/31/2018  . Palliative care by specialist   . 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 Subclinical hypothyroidism   . Port-A-Cath in place   . History of external beam radiation therapy   . Back pain 10/27/2018  . Presacral pelvic abscess from necrotic cervical cancer & rectovaginal fistula 10/26/2018  . DNR (do not resuscitate) discussion 10/15/2018  . Hydronephrosis 10/10/2018  . Other constipation 07/20/2018  . Preventive measure 07/20/2018  . Routine screening for STI (sexually transmitted infection) 07/02/2018  . Vaccine counseling 07/02/2018  . Hypomagnesemia 02/22/2018  . Pancytopenia, acquired ([place]) 02/22/2018  . Chemotherapy-induced nausea 02/15/2018  . Diarrhea 02/15/2018  . HIV (human immunodeficiency virus infection) ([place]) 02/09/2018  . Acquired hypothyroidism 02/08/2018  . Anemia, blood loss 02/08/2018  . Abnormal thyroid uptake 01/25/2018  . Cancer associated pain 01/25/2018  . Diabetes mellitus without complication ([place]) 47/42/5956  . Abnormal vaginal bleeding   . Primary cervical cancer with metastasis - Stage IV 01/04/2018    Palliative Care Assessment & Plan   Patient Profile: 53 year old with past medical history relevant for HIV on therapy, type 2 diabetes, cancer associated chronic pain, stage IV metastatic cervical cancer complicated by bilateral hydronephrosis status post PCN tubes on palliative care, chronic anemia, DVT not on anticoagulation due to chronic bleeding who presented with dislodged nephrostomy tubes as well as subjective fever, weakness and poor p.o. intake.   Patient known to PMT service, patient elected home with hospice on discharge in February.   Recommendations/Plan: - Pain: Reports that pain is currently well  controlled on methadone 5mg  TID and intermittent dilaudid 1mg  IV.  Discussed with hospice liaison who reports that most recent outpatient medication dosing per hospice records was methadone 15mg  TID with dilaudid 4mg  PO Q 3 hours as needed.  Will plan to continue methadone 5mg  TID, but will increased oral rescue dilaudid to 4mg  PO every 3 hours as needed as this is home dose and closer to equivalent of 1mg  IV which has been more successful in treating her pain effectively. - Attempted to discuss regarding code status and GOC and she became overwhelmed and began sobbing.  Stated she "can't do this" today. - Called and discussed with patient's husband.  He reports understanding that her disease is progressing, and he is trying to figure out the best way to continue to support her.  He appreciates updates as he is concerned about her but not able to see her in person.  Code Status:    Code Status Orders  (From admission, onward)         Start     Ordered   01/27/19 1402  Full code  Continuous     01/27/19 1401        Code Status History    Date Active Date Inactive Code Status Order ID Comments User Context   01/07/2019 0945 01/11/2019 1739 Full Code [id number]  Lavina Hamman, MD ED   12/18/2018 1002 12/19/2018 2101 DNR [id number]  [name], MD Inpatient   12/16/2018 0735 12/18/2018 1002 Full Code [id number]  Bonnielee Haff, MD ED   11/07/2018 1458 11/09/2018 2045 DNR [id number]  Knox Royalty, NP Inpatient   10/26/2018 2209 11/07/2018 1457 Full Code [id number]  Norval Morton, MD ED       Prognosis:   Poor  Discharge Planning:  Home with Hospice?  Care plan was discussed with patient, husband  Thank you for allowing the Palliative Medicine Team to assist in the care of this patient.   Total Time 40 Prolonged Time Billed No       Greater than 50%  of this time was spent counseling and coordinating care related to the above assessment and plan.  Micheline Rough, MD  Please contact Palliative Medicine Team phone at [phone] for questions and concerns.

## 2019-01-29 NOTE — Progress Notes (Signed)
PROGRESS NOTE    Misty Thomas  OTL:[id number] DOB: [birth year] DOA: 01/27/2019 PCP: Patient, No Pcp Per   Brief Narrative:  53 year old with past medical history relevant for HIV on therapy, type 2 diabetes, cancer associated chronic pain, stage IV metastatic cervical cancer complicated by bilateral hydronephrosis status post PCN tubes on palliative care, chronic anemia, DVT not on anticoagulation due to chronic bleeding who presented with dislodged nephrostomy tubes as well as subjective fever, weakness and poor p.o. intake.   Assessment & Plan:   Principal Problem:   Sepsis ([place]) Active Problems:   Primary cervical cancer with metastasis - Stage IV   Cancer associated pain   HIV (human immunodeficiency virus infection) ([place])   Hydronephrosis   Port-A-Cath in place   AKI (acute kidney injury) ([place])   Complicated UTI (urinary tract infection)   DVT (deep venous thrombosis) ([facility])   Nephrostomy tube bleed ([place])   Diabetes mellitus type 2, diet-controlled ([place])   Nephrostomy complication ([place])   #) Subjective fevers/weakness/poor p.o. intake: Urine culture growing 40,000 units of Candida albicans, unclear if this is related to her symptoms however she has both an abnormal GU tract as well as recent manipulation with nephrostomy tube exchange as well as removal of right ureteral stent. -Continue fluconazole started 01/28/2019 - blood cultures ordered 01/27/2019 no growth to date - urine cultures ordered 01/27/2019 from stent exchange and urine growing Candida albicans - Discontinue IV meropenem started 01/27/2019 to 01/28/2019  #) Metastatic stage IV cervical cancer status post bilateral PCN tubes: Patient apparently is not tolerating chemotherapy and was on palliative care.  She continues to remain full code. -Status post PCN tube replacement -Urology did not remove right ureteral stent on 01/28/2019 - Palliative care consulted, patient is reported to remain continued full  code -Continue pain control per below  #) AKI on stage II CKD: Improving with IV fluids -IV fluids -Avoid nephrotoxins  #) Acute on chronic blood loss anemia: Stable -Patient received 2 units packed red blood cells on 01/27/2019  #) Right lower extremity DVT: Not on any treatment due to chronic anemia and chronic blood loss.  #) HIV: -Continue Biktarvy  #) Diet-controlled diabetes: Regular diet  #) Pain/psych: - Continue clonazepam twice daily -Continue PRN oral hydromorphone -Continue methadone 5 mg 3 times daily -Continue mirtazapine 7.5 mg nightly -Continue PRN methocarbamol  Fluids: Gentle IV fluids Electrolytes: Monitor and supplement Nutrition: Regular diet  Prophylaxis: SCDs  Disposition: Pending improvement in creatinine and decreased white blood cell count  Full code    Consultants:   Interventional radiology  Urology  Palliative care  Procedures:  Bilateral PCN tube replacement 01/27/2019  Right ureteral stent removal 01/28/2019  Antimicrobials:   Continue meropenem started 01/27/2019   Subjective: This morning the patient appears to be a little sleepier.  She denies any nausea, vomiting, diarrhea, cough, congestion, rhinorrhea.  Objective: Vitals:   01/29/19 0400 01/29/19 0500 01/29/19 0600 01/29/19 0800  BP: 105/72 100/68 100/65   Pulse:  (!) 120 (!) 114   Resp: 15 20 11    Temp:    98.8 F (37.1 C)  TempSrc:    Oral  SpO2:  97% 96%   Weight:      Height:        Intake/Output Summary (Last 24 hours) at 01/29/2019 0837 Last data filed at 01/29/2019 0600 Gross per 24 hour  Intake 3256.62 ml  Output 800 ml  Net 2456.62 ml   Filed Weights   01/27/19 0817 01/27/19 9741  01/27/19 1444  Weight: 77.1 kg (S) 77.1 kg 71.3 kg    Examination:  General exam: Appears calm and comfortable  Respiratory system: Clear to auscultation. Respiratory effort normal. Cardiovascular system: Tachycardic, regular rhythm, no murmurs Gastrointestinal  system: Soft, nondistended, no rebound or guarding, plus bowel sounds Central nervous system: Alert and oriented.  Grossly intact, moving all extremities Extremities: Right greater than left lower extremity edema Skin: PCN tube sites are clean dry and intact, port site is clean dry and intact Psychiatry: Judgement and insight appear normal. Mood & affect flat.    Data Reviewed: I have personally reviewed following labs and imaging studies  CBC: Recent Labs  Lab 01/27/19 0850 01/27/19 1523 01/27/19 1942 01/28/19 0200 01/28/19 1430 01/29/19 0449  WBC 25.0*  --   --  23.1*  --  21.2*  NEUTROABS 21.8*  --   --   --   --   --   HGB 5.0* 5.1* 7.2* 6.6* 8.0* 8.1*  HCT 17.6* 18.0* 23.2* 21.8* 25.8* 26.2*  MCV 105.4*  --   --  98.6  --  96.0  PLT 605*  --   --  519*  --  161*   Basic Metabolic Panel: Recent Labs  Lab 01/27/19 0850 01/28/19 0200 01/29/19 0449  NA 135 139 141  K 4.9 4.6 4.2  CL 102 109 111  CO2 24 20* 21*  GLUCOSE 133* 80 74  BUN 47* 44* 38*  CREATININE 3.72* 2.89* 2.30*  CALCIUM 8.1* 7.6* 7.8*  MG  --   --  2.3   GFR: Estimated Creatinine Clearance: 25.8 mL/min (A) (by C-G formula based on SCr of 2.3 mg/dL (H)). Liver Function Tests: Recent Labs  Lab 01/27/19 0850  AST 21  ALT 9  ALKPHOS 132*  BILITOT 0.6  PROT 7.2  ALBUMIN 2.1*   No results for input(s): LIPASE, AMYLASE in the last 168 hours. No results for input(s): AMMONIA in the last 168 hours. Coagulation Profile: No results for input(s): INR, PROTIME in the last 168 hours. Cardiac Enzymes: No results for input(s): CKTOTAL, CKMB, CKMBINDEX, TROPONINI in the last 168 hours. BNP (last 3 results) No results for input(s): PROBNP in the last 8760 hours. HbA1C: No results for input(s): HGBA1C in the last 72 hours. CBG: No results for input(s): GLUCAP in the last 168 hours. Lipid Profile: No results for input(s): CHOL, HDL, LDLCALC, TRIG, CHOLHDL, LDLDIRECT in the last 72 hours. Thyroid  Function Tests: No results for input(s): TSH, T4TOTAL, FREET4, T3FREE, THYROIDAB in the last 72 hours. Anemia Panel: No results for input(s): VITAMINB12, FOLATE, FERRITIN, TIBC, IRON, RETICCTPCT in the last 72 hours. Sepsis Labs: Recent Labs  Lab 01/27/19 0930 01/27/19 1523 01/29/19 0449  PROCALCITON  --   --  0.73  LATICACIDVEN 1.5 0.6  --     Recent Results (from the past 240 hour(s))  Blood Culture (routine x 2)     Status: None (Preliminary result)   Collection Time: 01/27/19  9:30 AM  Result Value Ref Range Status   Specimen Description   Final    BLOOD RIGHT ARM Performed at [facility], [place] [address], [place], [place] [address]    Special Requests   Final    BOTTLES DRAWN AEROBIC AND ANAEROBIC Blood Culture results may not be optimal due to an excessive volume of blood received in culture bottles Performed at [facility] [address] [place], [place] [address]    Culture   Final  NO GROWTH < 24 HOURS Performed at [facility] [address] [place], [place] [address]    Report Status PENDING  Incomplete  Urine culture     Status: Abnormal   Collection Time: 01/27/19  9:34 AM  Result Value Ref Range Status   Specimen Description   Final    URINE, CLEAN CATCH Performed at [facility], [place] [address], [place], [place] [address]    Special Requests   Final    NONE Performed at [facility], [place] [address], [place], [place] [address]    Culture 40,000 COLONIES/mL YEAST (A)  Final   Report Status 01/28/2019 FINAL  Final  Blood Culture (routine x 2)     Status: None (Preliminary result)   Collection Time: 01/27/19  9:35 AM  Result Value Ref Range Status   Specimen Description   Final    PORTA CATH Performed at [facility] [address] [place], [place] [address]    Special Requests   Final    BOTTLES DRAWN AEROBIC AND ANAEROBIC Blood Culture adequate  volume Performed at [facility] [address], [place], [place] [address]    Culture   Final    NO GROWTH < 24 HOURS Performed at [facility] [address], [place], [place] [address]    Report Status PENDING  Incomplete  MRSA PCR Screening     Status: None   Collection Time: 01/27/19  1:30 PM  Result Value Ref Range Status   MRSA by PCR NEGATIVE NEGATIVE Final    Comment:        The GeneXpert MRSA Assay (FDA approved for NASAL specimens only), is one component of a comprehensive MRSA colonization surveillance program. It is not intended to diagnose MRSA infection nor to guide or monitor treatment for MRSA infections. Performed at [facility], [place] [address], [place], [place] [address]          Radiology Studies: Ct Abdomen Pelvis Wo Contrast  Result Date: 01/27/2019 CLINICAL DATA:  53 year old female with history of dislodgement of nephrostomy tube. Additional history of diarrhea. EXAM: CT ABDOMEN AND PELVIS WITHOUT CONTRAST TECHNIQUE: Multidetector CT imaging of the abdomen and pelvis was performed following the standard protocol without IV contrast. COMPARISON:  None. FINDINGS: Lower chest: Unremarkable. Hepatobiliary: Mild diffuse low attenuation throughout the visualized hepatic parenchyma, indicative of mild hepatic steatosis. No definite cystic or solid hepatic lesions noted on today's noncontrast CT examination. Unenhanced appearance of the gallbladder is normal. Pancreas: No definite pancreatic mass or peripancreatic fluid collections or inflammatory changes are noted on today's noncontrast CT examination. Spleen: Unremarkable. Adrenals/Urinary Tract: Previously noted left-sided nephrostomy tube has been removed. Right-sided nephrostomy tube also appears dislodged, with the pigtail catheter in the right paraspinal musculature. In the right ureter there is a right-sided double-J ureteral stent in position, with the proximal  loop reformed in the right renal pelvis and distal loop reformed in the urinary bladder. There appears to be some very mild right hydronephrosis at this time. On the left side there also appears to be some mild-to-moderate left hydronephrosis, but no hydroureter. Large low-attenuation lesion measuring 5.5 cm in the upper pole of the left kidney, incompletely characterized on today's noncontrast CT examination, but similar to prior studies, statistically likely a stent. Urinary bladder appears mildly thickened, but is otherwise grossly unremarkable in appearance on today's noncontrast CT examination. Bilateral adrenal glands are normal in appearance. Stomach/Bowel: Unenhanced appearance  of the stomach is normal. There is no pathologic dilatation of small bowel or colon. Mural thickening in the region of the rectum, poorly evaluated on today's noncontrast CT examination. Poorly defined fat planes between the rectum in the adjacent cervix also poorly evaluated on today's noncontrast CT examination. Vascular/Lymphatic: No atherosclerotic calcifications in the abdominal aorta. Prominent soft tissue along the pelvic sidewalls in the perirectal regions bilaterally, poorly evaluated on today's noncontrast CT examination. The possibility of lymphadenopathy is not excluded. No abdominal lymphadenopathy confidently identified. Reproductive: Uterus is heterogeneous in appearance with several small lesions, largest of which is partially calcified measuring 3.3 cm on the right side of the fundus, presumably a fibroid. Tissue planes are on the cervix are completely obscured. Ovaries are poorly evaluated on today's noncontrast CT examination, but do not appear enlarged. Other: No significant volume of ascites.  No pneumoperitoneum. Musculoskeletal: There are no aggressive appearing lytic or blastic lesions noted in the visualized portions of the skeleton. IMPRESSION: 1. Complete dislodgement of the patient's left nephrostomy tube.  Right nephrostomy tube is also partially dislodged) currently within the patient's right paraspinal musculature). Bilateral hydronephrosis is noted. 2. Right-sided double-J ureteral stent appears appropriately located. 3. Similar to the prior examination, there is complete obscuration of the tissue planes around the cervix, with thickening of the distal rectum, prominence of surrounding soft tissues of the meso rectum, and prominence of soft tissues along the pelvic sidewall bilaterally in the posterior pelvis, which could reflect either residual tumor or could be in part treatment related from prior radiation therapy. This is poorly evaluated on today's noncontrast CT examination. 4. Mild hepatic steatosis. Electronically Signed   By: Vinnie Langton M.D.   On: 01/27/2019 11:25   Dg Chest Port 1 View  Result Date: 01/27/2019 CLINICAL DATA:  Cough and fever. Bilateral nephrostomy tubes replaced today. EXAM: PORTABLE CHEST 1 VIEW COMPARISON:  One-view chest x-ray 12/15/2018 FINDINGS: A right IJ Port-A-Cath is in place. The heart size is normal. Lung volumes are low. Right upper lobe airspace disease is present. IMPRESSION: Right upper lobe pneumonia. Electronically Signed   By: [place] Morelle M.D.   On: 01/27/2019 14:38   Ir Nephrostomy Exchange Left  Result Date: 01/27/2019 CLINICAL DATA:  Cervical carcinoma with ureteral obstruction. Bilateral percutaneous nephrostomy catheters placed 11/04/2018, exchanged most recently 01/10/2019. Catheter pulled out. CT demonstrates complete dislodgement of the left tube, right tube displacement into the paraspinal musculature. Severe anemia. EXAM: RIGHT PERCUTANEOUS NEPHROSTOMY CATHETER EXCHANGE UNDER FLUOROSCOPY LEFT PERCUTANEOUS NEPHROSTOMY CATHETER PLACEMENT UNDER FLUOROSCOPIC GUIDANCE FLUOROSCOPY TIME:  6 MINUTES; [place]: The procedure, risks (including but not limited to bleeding, infection, organ damage ), benefits, and alternatives were explained  to the patient. Questions regarding the procedure were encouraged and answered. The patient understands and consents to the procedure. Bilateralflank regions prepped with Betadine, draped in usual sterile fashion, infiltrated locally with 1% lidocaine. Intravenous Fentanyl 223mcg and Versed 6mg  were administered as conscious sedation during continuous monitoring of the patient's level of consciousness and physiological / cardiorespiratory status by the radiology RN, with a total moderate sedation time of 22 minutes. Under fluoroscopy, a short 5 [place] Kumpe catheter was advanced through the tract of the previously placed left nephrostomy catheter, using small contrast injections under fluoroscopy for guidance, until access to the renal collecting system was achieved. The catheter was exchanged over a Careers adviser for [employer] vascular dilator, which facilitated advancement of a 10 French pigtail catheter, formed centrally within the left renal collecting system.  Contrast injection confirmed appropriate positioning. Under fluoroscopy, existing right catheter was injected, filling only a small cavity around the catheter, with no evident tract or communication to the renal collecting system. After the region was infiltrated with 1% lidocaine, the catheter was cut and exchanged over a Bentson wire for the Kumpe catheter. With the aid of an angled Glidewire and gentle probing, the tract was negotiated to the right renal collecting system. Small contrast injection confirmed appropriate positioning. The Kumpe was exchanged over a Bentson wire for 10 French vascular dilator, which facilitated placement of a 10 French pigtail drain catheter, formed centrally within the right renal collecting system. Contrast injection confirmed appropriate positioning. Both catheters were secured externally with 0 Prolene sutures and StatLock. Both were placed to gravity drainage. The patient tolerated the procedure well. COMPLICATIONS:  COMPLICATIONS none IMPRESSION: 1. Technically successful bilateral percutaneous nephrostomy catheter replacement. Electronically Signed   By: Lucrezia Europe M.D.   On: 01/27/2019 14:17   Ir Nephrostomy Exchange Right  Result Date: 01/27/2019 CLINICAL DATA:  Cervical carcinoma with ureteral obstruction. Bilateral percutaneous nephrostomy catheters placed 11/04/2018, exchanged most recently 01/10/2019. Catheter pulled out. CT demonstrates complete dislodgement of the left tube, right tube displacement into the paraspinal musculature. Severe anemia. EXAM: RIGHT PERCUTANEOUS NEPHROSTOMY CATHETER EXCHANGE UNDER FLUOROSCOPY LEFT PERCUTANEOUS NEPHROSTOMY CATHETER PLACEMENT UNDER FLUOROSCOPIC GUIDANCE FLUOROSCOPY TIME:  6 MINUTES; [place]: The procedure, risks (including but not limited to bleeding, infection, organ damage ), benefits, and alternatives were explained to the patient. Questions regarding the procedure were encouraged and answered. The patient understands and consents to the procedure. Bilateralflank regions prepped with Betadine, draped in usual sterile fashion, infiltrated locally with 1% lidocaine. Intravenous Fentanyl 265mcg and Versed 6mg  were administered as conscious sedation during continuous monitoring of the patient's level of consciousness and physiological / cardiorespiratory status by the radiology RN, with a total moderate sedation time of 22 minutes. Under fluoroscopy, a short 5 [place] Kumpe catheter was advanced through the tract of the previously placed left nephrostomy catheter, using small contrast injections under fluoroscopy for guidance, until access to the renal collecting system was achieved. The catheter was exchanged over a Careers adviser for [employer] vascular dilator, which facilitated advancement of a 10 French pigtail catheter, formed centrally within the left renal collecting system. Contrast injection confirmed appropriate positioning. Under fluoroscopy, existing right  catheter was injected, filling only a small cavity around the catheter, with no evident tract or communication to the renal collecting system. After the region was infiltrated with 1% lidocaine, the catheter was cut and exchanged over a Bentson wire for the Kumpe catheter. With the aid of an angled Glidewire and gentle probing, the tract was negotiated to the right renal collecting system. Small contrast injection confirmed appropriate positioning. The Kumpe was exchanged over a Bentson wire for 10 French vascular dilator, which facilitated placement of a 10 French pigtail drain catheter, formed centrally within the right renal collecting system. Contrast injection confirmed appropriate positioning. Both catheters were secured externally with 0 Prolene sutures and StatLock. Both were placed to gravity drainage. The patient tolerated the procedure well. COMPLICATIONS: COMPLICATIONS none IMPRESSION: 1. Technically successful bilateral percutaneous nephrostomy catheter replacement. Electronically Signed   By: Lucrezia Europe M.D.   On: 01/27/2019 14:17        Scheduled Meds:  sodium chloride   Intravenous Once   bictegravir-emtricitabine-tenofovir AF  1 tablet Oral Daily   Chlorhexidine Gluconate Cloth  6 each Topical Daily   clonazePAM  0.25 mg  Oral BID   magnesium oxide  400 mg Oral BID   mouth rinse  15 mL Mouth Rinse BID   methadone  5 mg Oral TID   mirtazapine  7.5 mg Oral QHS   sodium chloride flush  10-40 mL Intracatheter Q12H   sodium chloride flush  3 mL Intravenous Q12H   sodium chloride flush  5 mL Intracatheter Q8H   Continuous Infusions:  sodium chloride Stopped (01/28/19 1556)   fluconazole (DIFLUCAN) IV       LOS: 2 days    Time spent: [place], MD [facility]  If 7PM-7AM, please contact night-coverage [email or web address] Password [facility] 01/29/2019, 8:37 AM

## 2019-01-29 NOTE — Progress Notes (Signed)
Manufacturing engineer [facility]) Hospice GIP RN   This is a related GIP admission, pt is self pay with hospice and [employer] will be her covering payor.  Mrs. Snowden has  an [facility] hospice diagnosis of cervical cancer per Dr. Tomasa Hosteller.  Pt spouse activated EMS after he noticed her nephrostomy tube was leaking.  She is admitted with sepsis and nephrostomy tube dislodgement.  Spoke with pt, she is comfortable.  Denies any current pain and is not needing break through pain medication very often.  Spoke with Palliative Medical MD, he endorses she is comfortable currently.    Attempted discussion surrounding code status.   She is not ready to have this discussion.  [place] has been considered for symptom management in the past (she was scheduled to got to [facility] prior to this admission, but only for symptom mgmt).  V/S: 98.8 oral, 111/71, HR 113, RR 12, SPO2 99% RA I&O: last 24 hours 1924/425  Lab work: BUN 38, cr 2.30, WBC 21.2, RBC 2.73, hgb 68.1, HCT 26.2  IVs, PRNs:  Diflucan 400 mg IV QD, LR bolus 1,000x1, dilaudid 1mg  IV x 3 @ 0105, 0523, 1152, dilaudid 4mg  PO @ 1214  Problem List: Urosepsis- urine cultures 40,000u candida albicans, diflucan started 3/24 RUL PNA- merrem 1g IV q12h, stopped 3/23 Bilateral nephrostomy tube dislodgment:  CT showed hydronephrosis, replaced percutaneously 3/23 AKI on CKD: cr 2.3 Anemia- chronic, received 3 UPRBC 3/22-23  IDT:  Updated  GOC: ongoing, she was not able to discuss her code status further than saying she will discuss with her husband  Please call with any hospice related questions/concerns.  Venia Carbon RN, BSN, [place] [facility] Liaison  [phone]

## 2019-01-29 NOTE — Progress Notes (Signed)
Initial Nutrition Assessment  RD working remotely.   DOCUMENTATION CODES:   Not applicable  INTERVENTION:  - Will order Boost Breeze BID, each supplement provides 250 kcal and 9 grams of protein. - Will order Ensure Enlive BID, each supplement provides 350 kcal and 20 grams of protein. - Will order daily multivitamin with minerals.  - Continue to encourage PO intakes.  - If PO intakes remain poor, recommend discussion with patient and/or family about small bore NGT vs PEG for TF.    NUTRITION DIAGNOSIS:   Increased nutrient needs related to chronic illness, catabolic illness, cancer and cancer related treatments as evidenced by estimated needs.  GOAL:   Patient will meet greater than or equal to 90% of their needs  MONITOR:   PO intake, Supplement acceptance, Weight trends, Labs  REASON FOR ASSESSMENT:   Malnutrition Screening Tool  ASSESSMENT:   53 year old with past medical history relevant for HIV on therapy, type 2 DM, cancer-associated chronic pain, stage IV metastatic cervical cancer complicated by bilateral hydronephrosis s/p PCN tubes on palliative care, chronic anemia, and DVT not on anticoagulation due to chronic bleeding. She presented to the ED d/t dislodged nephrostomy tubes as well as subjective fever, weakness, and poor p.o. intake.  BMI indicates overweight status. No intakes documented since admission. RN flow sheet indicates that patient is a/o to self and place.   Patient was last seen by this RD on 2/11; no other RD visits since that time. At that visit RD spoke with patient's sister as patient was confused and lethargic. Sister reported poor appetite and intakes around that time and that patient needed much encouragement to take even a few bites or sips of items. During that visit, sister had asked RD if a poor appetite is common for a patient who may be transitioning to hospice care.   Per chart review, current weight is 157 lb and weight on 2/8 was 169  lb. This indicates 12 lb weight loss (7% body weight) in the past 1 and a half months. Highly suspect that patient meets some degree of malnutrition, but unable to confirm at this time.   Per Dr. Harvel Quale note this AM: fevers, weakness, and poor appetite, not tolerating chemo and was on palliative care PTA, AKI on stage 2 CKD, acute on chronic blood loss anemia.   Palliative Care consulted and saw patient and patient's husband yesterday. Plan is for patient to remain full code. She remains enrolled in home hospice services. Prognosis documented as <3 months.   In light of this, if PO intakes remain poor a small bore NGT vs PEG for tube feeding should be discussed with patient and her family.     Medications reviewed; 400 mg mag-ox BID.  Labs reviewed; BUN: 38 mg/dl, creatinine: 2.3 mg/dl, Ca: 7.8 mg/dl, GFR: 27 ml/min.  IVF; NS @ 75 ml/hr.     NUTRITION - FOCUSED PHYSICAL EXAM:  Unable to complete at this time.   Diet Order:   Diet Order            Diet regular Room service appropriate? Yes; Fluid consistency: Thin  Diet effective now              EDUCATION NEEDS:   No education needs have been identified at this time  Skin:  Skin Assessment: Reviewed RN Assessment  Last BM:  3/24  Height:   Ht Readings from Last 1 Encounters:  01/27/19 5\' 1"  (1.549 m)    Weight:   Wt Readings  from Last 1 Encounters:  01/27/19 71.3 kg    Ideal Body Weight:  47.73 kg  BMI:  Body mass index is 29.7 kg/m.  Estimated Nutritional Needs:   Kcal:  8727-6184 kcal  Protein:  100-115 grams  Fluid:  >/= 2.1 L/day     Jarome Matin, MS, RD, LDN, [facility] Inpatient Clinical Dietitian Pager # [phone] After hours/weekend pager # [phone]

## 2019-01-30 LAB — CBC
HCT: 27.1 % — ABNORMAL LOW (ref 36.0–46.0)
Hemoglobin: 8.1 g/dL — ABNORMAL LOW (ref 12.0–15.0)
MCH: 29 pg (ref 26.0–34.0)
MCHC: 29.9 g/dL — ABNORMAL LOW (ref 30.0–36.0)
MCV: 97.1 fL (ref 80.0–100.0)
Platelets: 476 K/uL — ABNORMAL HIGH (ref 150–400)
RBC: 2.79 MIL/uL — ABNORMAL LOW (ref 3.87–5.11)
RDW: 19 % — ABNORMAL HIGH (ref 11.5–15.5)
WBC: 19.5 10*3/uL — ABNORMAL HIGH (ref 4.0–10.5)
nRBC: 0 % (ref 0.0–0.2)

## 2019-01-30 LAB — BASIC METABOLIC PANEL
Anion gap: 9 (ref 5–15)
BUN: 32 mg/dL — ABNORMAL HIGH (ref 6–20)
CO2: 19 mmol/L — ABNORMAL LOW (ref 22–32)
Calcium: 7.9 mg/dL — ABNORMAL LOW (ref 8.9–10.3)
Chloride: 114 mmol/L — ABNORMAL HIGH (ref 98–111)
Creatinine, Ser: 1.71 mg/dL — ABNORMAL HIGH (ref 0.44–1.00)
GFR calc Af Amer: 39 mL/min — ABNORMAL LOW (ref >60)
Potassium: 4 mmol/L (ref 3.5–5.1)
Sodium: 142 mmol/L (ref 135–145)

## 2019-01-30 LAB — BASIC METABOLIC PANEL WITH GFR
GFR calc non Af Amer: 34 mL/min — ABNORMAL LOW (ref >60)
Glucose, Bld: 86 mg/dL (ref 70–99)

## 2019-01-30 LAB — PROCALCITONIN: Procalcitonin: 0.53 ng/mL

## 2019-01-30 MED ORDER — HYDROMORPHONE HCL 2 MG PO TABS: 1.0000 mg | ORAL_TABLET | ORAL | 0 refills | Status: DC | PRN

## 2019-01-30 MED ORDER — METHADONE HCL 5 MG PO TABS: 15.0000 mg | ORAL_TABLET | Freq: Three times a day (TID) | ORAL | 0 refills | Status: AC

## 2019-01-30 MED ORDER — HEPARIN SOD (PORK) LOCK FLUSH 100 UNIT/ML IV SOLN
500.0000 [IU] | INTRAVENOUS | Status: AC | PRN
  Administered 2019-01-30: 500 [IU]

## 2019-01-30 MED ORDER — FLUCONAZOLE 200 MG PO TABS: 200.0000 mg | ORAL_TABLET | Freq: Every day | ORAL | 0 refills | Status: AC

## 2019-01-30 MED ORDER — HYDROMORPHONE HCL 2 MG PO TABS: 4.0000 mg | ORAL_TABLET | ORAL | 0 refills | Status: AC | PRN

## 2019-01-30 NOTE — Discharge Summary (Addendum)
Physician Discharge Summary  Misty Thomas MPN:[id number] DOB: [birth year] DOA: 01/27/2019  PCP: Patient, No Pcp Per  Admit date: 01/27/2019 Discharge date: 01/30/2019  Admitted From: Home hospice Disposition:  Home Hospice  Recommendations for Outpatient Follow-up:  1. Follow up with with your home hospice provider  Home Health: Home hospice Equipment/Devices: Bilateral PCN tubes  Discharge Condition: Home hospice CODE STATUS: Full Diet recommendation: Regular  Brief/Interim Summary:  #) Subjective fever/weakness/poor p.o. intake: Patient was admitted with subjective fevers and weakness as well as diminished p.o. intake.  This was initially thought to be due to a possible infection as patient has a history of ESBL gram-negative's in her urine due to abnormal GU tract.  Patient was initially on IV meropenem however this was discontinued after her urine cultures grew out Candida albicans.  Due to instrumentation of the GU tract as well as her abnormal anatomy this was treated as a true infection.  She was initially given IV fluconazole and then discharged home to complete a total of 2 weeks of p.o. fluconazole.  #) Metastatic stage IV cervical cancer status post bilateral PCN tubes: Patient was admitted with dislodgment of bilateral PCN tubes.  These were exchanged by interventional radiology.  Urology removed right ureteral stent on 01/28/2019.  Palliative care was again consulted and discussed with the family and despite being on home hospice patient wants to remain full code.  She was given prescriptions for home PRN hydromorphone orally however this prescriber could not prescribe methadone and home hospice will prescribe.  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  This was presumed to be largely post renal due to dislodged PCN tube but also possibly prerenal due to poor p.o. intake.  Patient was given IV fluids and PCN tubes were placed.  AKI improved to baseline.  #) Acute on  chronic blood loss anemia: Patient received 2 units packed red blood cells.  Her hemoglobin was stable on discharge.  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  Due to her chronic anemia, poor prognosis chronic chronic blood loss she was not on any treatment for this.  She was not sent home on any treatment for this.  #) HIV: Patient was continued on home Biktarvy.  #) Diet-controlled diabetes: Patient was given a regular diet as strict blood sugar control is no longer clinically indicated.  #) Pain/psych: Patient was continued on scheduled methadone, mirtazapine nightly next, PRN oral hydromorphone, PRN clonazepam.  Discharge Diagnoses:  Principal Problem:   Sepsis ([place]) Active Problems:   Primary cervical cancer with metastasis - Stage IV   Cancer associated pain   HIV (human immunodeficiency virus infection) ([place])   Hydronephrosis   Port-A-Cath in place   AKI (acute kidney injury) ([place])   Complicated UTI (urinary tract infection)   DVT (deep venous thrombosis) ([facility])   Nephrostomy tube bleed ([place])   Diabetes mellitus type 2, diet-controlled ([place])   Nephrostomy complication [facility])    Discharge Instructions  Discharge Instructions    Call MD for:  persistant nausea and vomiting   Complete by:  As directed    Call MD for:  severe uncontrolled pain   Complete by:  As directed    Diet - low sodium heart healthy   Complete by:  As directed    Discharge instructions   Complete by:  As directed    Please follow-up with your hospice provider.   Increase activity slowly   Complete by:  As directed      Allergies as of 01/30/2019   No Known Allergies     Medication List    STOP taking these medications   ondansetron 8 MG tablet Commonly known as:  ZOFRAN     TAKE these medications   acetaminophen 500 MG tablet Commonly known as:  TYLENOL Take 500 mg by mouth every 6 (six) hours as needed for mild pain.   Biktarvy 50-200-25 MG Tabs  tablet Generic drug:  bictegravir-emtricitabine-tenofovir AF TAKE 1 TABLET BY MOUTH DAILY.   clonazePAM 0.5 MG tablet Commonly known as:  KLONOPIN Take 0.5 tablets (0.25 mg total) by mouth 2 (two) times daily.   fluconazole 200 MG tablet Commonly known as:  Diflucan Take 1 tablet (200 mg total) by mouth daily for 14 days.   HYDROmorphone 2 MG tablet Commonly known as:  DILAUDID Take 2 tablets (4 mg total) by mouth every 3 (three) hours as needed for moderate pain or severe pain. What changed:  how much to take   lidocaine-prilocaine cream Commonly known as:  EMLA Apply 1 application topically as needed.   magnesium oxide 400 (241.3 Mg) MG tablet Commonly known as:  MAG-OX Take 1 tablet (400 mg total) by mouth 2 (two) times daily.   methadone 5 MG tablet Commonly known as:  DOLOPHINE Take 3 tablets (15 mg total) by mouth 3 (three) times daily for 30 days. What changed:  how much to take   methocarbamol 500 MG tablet Commonly known as:  ROBAXIN Take 1 tablet (500 mg total) by mouth 3 (three) times daily. What changed:    when to take this  reasons to take this   mirtazapine 7.5 MG tablet Commonly known as:  REMERON Take 1 tablet (7.5 mg total) by mouth at bedtime.   senna-docusate 8.6-50 MG tablet Commonly known as:  Senokot-S Take 2 tablets by mouth 2 (two) times daily.       No Known Allergies  Consultations:  Urology  Palliative Care  IR   Procedures/Studies: Ct Abdomen Pelvis Wo Contrast  Result Date: 01/27/2019 CLINICAL DATA:  53 year old female with history of dislodgement of nephrostomy tube. Additional history of diarrhea. EXAM: CT ABDOMEN AND PELVIS WITHOUT CONTRAST TECHNIQUE: Multidetector CT imaging of the abdomen and pelvis was performed following the standard protocol without IV contrast. COMPARISON:  None. FINDINGS: Lower chest: Unremarkable. Hepatobiliary: Mild diffuse low attenuation throughout the visualized hepatic parenchyma, indicative  of mild hepatic steatosis. No definite cystic or solid hepatic lesions noted on today's noncontrast CT examination. Unenhanced appearance of the gallbladder is normal. Pancreas: No definite pancreatic mass or peripancreatic fluid collections or inflammatory changes are noted on today's noncontrast CT examination. Spleen: Unremarkable. Adrenals/Urinary Tract: Previously noted left-sided nephrostomy tube has been removed. Right-sided nephrostomy tube also appears dislodged, with the pigtail catheter in the right paraspinal musculature. In the right ureter there is a right-sided double-J ureteral stent in position, with the proximal loop reformed in the right renal pelvis and distal loop reformed in the urinary bladder. There appears to be some very mild right hydronephrosis at this time. On the left side there also appears to be some mild-to-moderate left hydronephrosis, but no hydroureter. Large low-attenuation lesion measuring 5.5 cm in the upper pole of the left kidney, incompletely characterized on today's noncontrast CT examination, but similar to prior studies, statistically likely a stent. Urinary bladder appears mildly thickened, but is otherwise grossly unremarkable in appearance on today's noncontrast CT examination. Bilateral adrenal glands are normal in appearance.  Stomach/Bowel: Unenhanced appearance of the stomach is normal. There is no pathologic dilatation of small bowel or colon. Mural thickening in the region of the rectum, poorly evaluated on today's noncontrast CT examination. Poorly defined fat planes between the rectum in the adjacent cervix also poorly evaluated on today's noncontrast CT examination. Vascular/Lymphatic: No atherosclerotic calcifications in the abdominal aorta. Prominent soft tissue along the pelvic sidewalls in the perirectal regions bilaterally, poorly evaluated on today's noncontrast CT examination. The possibility of lymphadenopathy is not excluded. No abdominal  lymphadenopathy confidently identified. Reproductive: Uterus is heterogeneous in appearance with several small lesions, largest of which is partially calcified measuring 3.3 cm on the right side of the fundus, presumably a fibroid. Tissue planes are on the cervix are completely obscured. Ovaries are poorly evaluated on today's noncontrast CT examination, but do not appear enlarged. Other: No significant volume of ascites.  No pneumoperitoneum. Musculoskeletal: There are no aggressive appearing lytic or blastic lesions noted in the visualized portions of the skeleton. IMPRESSION: 1. Complete dislodgement of the patient's left nephrostomy tube. Right nephrostomy tube is also partially dislodged) currently within the patient's right paraspinal musculature). Bilateral hydronephrosis is noted. 2. Right-sided double-J ureteral stent appears appropriately located. 3. Similar to the prior examination, there is complete obscuration of the tissue planes around the cervix, with thickening of the distal rectum, prominence of surrounding soft tissues of the meso rectum, and prominence of soft tissues along the pelvic sidewall bilaterally in the posterior pelvis, which could reflect either residual tumor or could be in part treatment related from prior radiation therapy. This is poorly evaluated on today's noncontrast CT examination. 4. Mild hepatic steatosis. Electronically Signed   By: Vinnie Langton M.D.   On: 01/27/2019 11:25   Dg Chest Port 1 View  Result Date: 01/27/2019 CLINICAL DATA:  Cough and fever. Bilateral nephrostomy tubes replaced today. EXAM: PORTABLE CHEST 1 VIEW COMPARISON:  One-view chest x-ray 12/15/2018 FINDINGS: A right IJ Port-A-Cath is in place. The heart size is normal. Lung volumes are low. Right upper lobe airspace disease is present. IMPRESSION: Right upper lobe pneumonia. Electronically Signed   By: San Morelle M.D.   On: 01/27/2019 14:38   Ct Renal Stone Study  Result Date:  01/07/2019 CLINICAL DATA:  Bilateral nephrostomy tube with pain and bleeding on the right. EXAM: CT ABDOMEN AND PELVIS WITHOUT CONTRAST TECHNIQUE: Multidetector CT imaging of the abdomen and pelvis was performed following the standard protocol without IV contrast. COMPARISON:  12/15/2018 FINDINGS: Lower chest:  No contributory findings. Hepatobiliary: No focal liver abnormality.No evidence of biliary obstruction or stone. Pancreas: Unremarkable. Spleen: Unremarkable. Adrenals/Urinary Tract: Negative adrenals. Bilateral percutaneous nephrostomy tube that is located. There is also a right-sided ureteral stent. No hydronephrosis. Cystic density about the left kidney which could be parenchymal or subcapsular, up to 4.8 cm and stable. Indistinct high-density within the bladder, compatible with hemorrhage in this clinical setting. No superimposed stone is seen. No detectable bladder wall thickening to imply radiation cystitis. Stomach/Bowel: Formed stool throughout most of the colon. Negative for obstruction or visible bowel wall thickening. Vascular/Lymphatic: Asymmetric right lower extremity edema with common femoral vein mural thickening, suspect there is subacute or chronic DVT when correlated with previous imaging. Reproductive:History of cervical cancer with indistinct soft tissue planes in the pelvis correlating with radiotherapy. Fibroid is present on the right, measuring 3.5 cm. Other: No ascites or pneumoperitoneum. Indistinct retroperitoneal soft tissues without discrete adenopathy, stable. Musculoskeletal: Osteopenia.  No acute finding IMPRESSION: 1. High-density clot within the  urinary bladder from uncertain source. 2. Bilateral percutaneous nephrostomy tubes with no hydronephrosis. Located right ureteral stent. 3. Suspect subacute or chronic femoral DVT on the right, please correlate with venous ultrasound. Electronically Signed   By: Monte Fantasia M.D.   On: 01/07/2019 04:43   Ir Nephrostomy Exchange  Left  Result Date: 01/27/2019 CLINICAL DATA:  Cervical carcinoma with ureteral obstruction. Bilateral percutaneous nephrostomy catheters placed 11/04/2018, exchanged most recently 01/10/2019. Catheter pulled out. CT demonstrates complete dislodgement of the left tube, right tube displacement into the paraspinal musculature. Severe anemia. EXAM: RIGHT PERCUTANEOUS NEPHROSTOMY CATHETER EXCHANGE UNDER FLUOROSCOPY LEFT PERCUTANEOUS NEPHROSTOMY CATHETER PLACEMENT UNDER FLUOROSCOPIC GUIDANCE FLUOROSCOPY TIME:  6 MINUTES; [place]: The procedure, risks (including but not limited to bleeding, infection, organ damage ), benefits, and alternatives were explained to the patient. Questions regarding the procedure were encouraged and answered. The patient understands and consents to the procedure. Bilateralflank regions prepped with Betadine, draped in usual sterile fashion, infiltrated locally with 1% lidocaine. Intravenous Fentanyl 253mcg and Versed 6mg  were administered as conscious sedation during continuous monitoring of the patient's level of consciousness and physiological / cardiorespiratory status by the radiology RN, with a total moderate sedation time of 22 minutes. Under fluoroscopy, a short 5 [place] Kumpe catheter was advanced through the tract of the previously placed left nephrostomy catheter, using small contrast injections under fluoroscopy for guidance, until access to the renal collecting system was achieved. The catheter was exchanged over a Care[employer] adviser for [employer] vascular dilator, which facilitated advancement of a 10 French pigtail catheter, formed centrally within the left renal collecting system. Contrast injection confirmed appropriate positioning. Under fluoroscopy, existing right catheter was injected, filling only a small cavity around the catheter, with no evident tract or communication to the renal collecting system. After the region was infiltrated with 1% lidocaine, the catheter was  cut and exchanged over a Bentson wire for the Kumpe catheter. With the aid of an angled Glidewire and gentle probing, the tract was negotiated to the right renal collecting system. Small contrast injection confirmed appropriate positioning. The Kumpe was exchanged over a Bentson wire for 10 French vascular dilator, which facilitated placement of a 10 French pigtail drain catheter, formed centrally within the right renal collecting system. Contrast injection confirmed appropriate positioning. Both catheters were secured externally with 0 Prolene sutures and StatLock. Both were placed to gravity drainage. The patient tolerated the procedure well. COMPLICATIONS: COMPLICATIONS none IMPRESSION: 1. Technically successful bilateral percutaneous nephrostomy catheter replacement. Electronically Signed   By: Lucrezia Europe M.D.   On: 01/27/2019 14:17   Ir Nephrostomy Exchange Left  Result Date: 01/10/2019 INDICATION: 53 year old with a history of cervical cancer and bilateral nephrostomy tubes. Patient also has a right ureter stent. There has been bleeding from the right nephrostomy tube. Recent CT demonstrated blood in the bladder and suggested that both nephrostomy tubes have been slightly retracted. Plan for bilateral nephrostomy tube exchanges. EXAM: EXCHANGE BILATERAL NEPHROSTOMY TUBES WITH FLUOROSCOPY Physician: Stephan Minister. Anselm Pancoast, MD COMPARISON:  None. MEDICATIONS: Fentanyl 50 mcg ANESTHESIA/SEDATION: Fentanyl 50 mcg CONTRAST:  50 mL Omnipaque 300-administered into the collecting system(s) FLUOROSCOPY TIME:  Fluoroscopy Time: 2 minutes, 18 seconds, 34 mGy COMPLICATIONS: None immediate. PROCEDURE: The procedure was explained to the patient. The risks and benefits of the procedure were discussed and the patient's questions were addressed. Informed consent was obtained from the patient. Both flanks were prepped and draped in sterile fashion. Maximal barrier sterile technique was utilized including caps, mask, sterile gowns,  sterile gloves, sterile drape, hand hygiene and skin antiseptic. Contrast was injected through the left nephrostomy tube demonstrating placement in the renal collecting system. The catheter was cut and removed over a wire. New 10 French drain was advanced over the wire. Tube was reconstituted in left renal pelvis. Skin was anesthetized with 1% lidocaine and sutured to skin. Catheter was attached to gravity bag. Right nephrostomy tube was injected with contrast. The tube was removed over a Bentson wire and exchanged for a new tube with difficulty. Due to the small size of the collecting system and the existing ureter stent, the nephrostomy tube was not well positioned in the renal pelvis. Therefore, the catheter was removed. During removal of the nephrostomy tube, wire access was lost. Five French catheter was easily advanced back into the renal collecting system. The catheter was exchanged for a [place] catheter over a Bentson. Catheter was well positioned in the renal pelvis adjacent to the ureter stent. Contrast injection confirmed placement in the renal pelvis. Skin was anesthetized with 1% lidocaine and the catheter was sutured to the skin. Right nephrostomy tube was placed to gravity bag. IMPRESSION: Successful exchange of bilateral nephrostomy tubes with fluoroscopy. Patient has bilateral 10 French catheters. The right nephrostomy tube is a Bettey Mare catheter. Nephrostomy tube exchange was very uncomfortable for the patient due to abdominal pain. Patient will need conscious sedation for nephrostomy tube exchanges in the future. Electronically Signed   By: Markus Daft M.D.   On: 01/10/2019 17:08   Ir Nephrostomy Exchange Right  Result Date: 01/27/2019 CLINICAL DATA:  Cervical carcinoma with ureteral obstruction. Bilateral percutaneous nephrostomy catheters placed 11/04/2018, exchanged most recently 01/10/2019. Catheter pulled out. CT demonstrates complete dislodgement of the left tube,  right tube displacement into the paraspinal musculature. Severe anemia. EXAM: RIGHT PERCUTANEOUS NEPHROSTOMY CATHETER EXCHANGE UNDER FLUOROSCOPY LEFT PERCUTANEOUS NEPHROSTOMY CATHETER PLACEMENT UNDER FLUOROSCOPIC GUIDANCE FLUOROSCOPY TIME:  6 MINUTES; [place]: The procedure, risks (including but not limited to bleeding, infection, organ damage ), benefits, and alternatives were explained to the patient. Questions regarding the procedure were encouraged and answered. The patient understands and consents to the procedure. Bilateralflank regions prepped with Betadine, draped in usual sterile fashion, infiltrated locally with 1% lidocaine. Intravenous Fentanyl 274mcg and Versed 6mg  were administered as conscious sedation during continuous monitoring of the patient's level of consciousness and physiological / cardiorespiratory status by the radiology RN, with a total moderate sedation time of 22 minutes. Under fluoroscopy, a short 5 [place] Kumpe catheter was advanced through the tract of the previously placed left nephrostomy catheter, using small contrast injections under fluoroscopy for guidance, until access to the renal collecting system was achieved. The catheter was exchanged over a Careers adviser for [employer] vascular dilator, which facilitated advancement of a 10 French pigtail catheter, formed centrally within the left renal collecting system. Contrast injection confirmed appropriate positioning. Under fluoroscopy, existing right catheter was injected, filling only a small cavity around the catheter, with no evident tract or communication to the renal collecting system. After the region was infiltrated with 1% lidocaine, the catheter was cut and exchanged over a Bentson wire for the Kumpe catheter. With the aid of an angled Glidewire and gentle probing, the tract was negotiated to the right renal collecting system. Small contrast injection confirmed appropriate positioning. The Kumpe was exchanged over a  Bentson wire for 10 French vascular dilator, which facilitated placement of a 10 French pigtail drain catheter, formed centrally within the right renal collecting  system. Contrast injection confirmed appropriate positioning. Both catheters were secured externally with 0 Prolene sutures and StatLock. Both were placed to gravity drainage. The patient tolerated the procedure well. COMPLICATIONS: COMPLICATIONS none IMPRESSION: 1. Technically successful bilateral percutaneous nephrostomy catheter replacement. Electronically Signed   By: Lucrezia Europe M.D.   On: 01/27/2019 14:17   Ir Nephrostomy Exchange Right  Result Date: 01/10/2019 INDICATION: 53 year old with a history of cervical cancer and bilateral nephrostomy tubes. Patient also has a right ureter stent. There has been bleeding from the right nephrostomy tube. Recent CT demonstrated blood in the bladder and suggested that both nephrostomy tubes have been slightly retracted. Plan for bilateral nephrostomy tube exchanges. EXAM: EXCHANGE BILATERAL NEPHROSTOMY TUBES WITH FLUOROSCOPY Physician: Stephan Minister. Anselm Pancoast, MD COMPARISON:  None. MEDICATIONS: Fentanyl 50 mcg ANESTHESIA/SEDATION: Fentanyl 50 mcg CONTRAST:  50 mL Omnipaque 300-administered into the collecting system(s) FLUOROSCOPY TIME:  Fluoroscopy Time: 2 minutes, 18 seconds, 34 mGy COMPLICATIONS: None immediate. PROCEDURE: The procedure was explained to the patient. The risks and benefits of the procedure were discussed and the patient's questions were addressed. Informed consent was obtained from the patient. Both flanks were prepped and draped in sterile fashion. Maximal barrier sterile technique was utilized including caps, mask, sterile gowns, sterile gloves, sterile drape, hand hygiene and skin antiseptic. Contrast was injected through the left nephrostomy tube demonstrating placement in the renal collecting system. The catheter was cut and removed over a wire. New 10 French drain was advanced over the wire. Tube  was reconstituted in left renal pelvis. Skin was anesthetized with 1% lidocaine and sutured to skin. Catheter was attached to gravity bag. Right nephrostomy tube was injected with contrast. The tube was removed over a Bentson wire and exchanged for a new tube with difficulty. Due to the small size of the collecting system and the existing ureter stent, the nephrostomy tube was not well positioned in the renal pelvis. Therefore, the catheter was removed. During removal of the nephrostomy tube, wire access was lost. Five French catheter was easily advanced back into the renal collecting system. The catheter was exchanged for a [place] catheter over a Bentson. Catheter was well positioned in the renal pelvis adjacent to the ureter stent. Contrast injection confirmed placement in the renal pelvis. Skin was anesthetized with 1% lidocaine and the catheter was sutured to the skin. Right nephrostomy tube was placed to gravity bag. IMPRESSION: Successful exchange of bilateral nephrostomy tubes with fluoroscopy. Patient has bilateral 10 French catheters. The right nephrostomy tube is a Bettey Mare catheter. Nephrostomy tube exchange was very uncomfortable for the patient due to abdominal pain. Patient will need conscious sedation for nephrostomy tube exchanges in the future. Electronically Signed   By: Markus Daft M.D.   On: 01/10/2019 17:08    Bilateral PCN tube replacement 01/27/2019  Right ureteral stent removal 01/28/2019   Subjective:   Discharge Exam: Vitals:   01/30/19 0400 01/30/19 0800  BP:    Pulse:    Resp:    Temp: 100.3 F (37.9 C) 99.1 F (37.3 C)  SpO2:     Vitals:   01/29/19 2250 01/29/19 2354 01/30/19 0400 01/30/19 0800  BP:      Pulse:      Resp:      Temp: 98.5 F (36.9 C) 99.7 F (37.6 C) 100.3 F (37.9 C) 99.1 F (37.3 C)  TempSrc: Oral Oral Oral Oral  SpO2:      Weight:      Height:  General exam: Appears calm and comfortable  Respiratory  system: Clear to auscultation. Respiratory effort normal. Cardiovascular system: Tachycardic, regular rhythm, no murmurs Gastrointestinal system: Soft, nondistended, no rebound or guarding, plus bowel sounds Central nervous system: Alert and oriented.  Grossly intact, moving all extremities Extremities: Right greater than left lower extremity edema Skin: PCN tube sites are clean dry and intact, port site is clean dry and intact Psychiatry: Judgement and insight appear normal. Mood & affect flat.     The results of significant diagnostics from this hospitalization (including imaging, microbiology, ancillary and laboratory) are listed below for reference.     Microbiology: Recent Results (from the past 240 hour(s))  Blood Culture (routine x 2)     Status: None (Preliminary result)   Collection Time: 01/27/19  9:30 AM  Result Value Ref Range Status   Specimen Description   Final    BLOOD RIGHT ARM Performed at [facility], [address] [place], [place] [address]    Special Requests   Final    BOTTLES DRAWN AEROBIC AND ANAEROBIC Blood Culture results may not be optimal due to an excessive volume of blood received in culture bottles Performed at [place] [address] [place], [place] [address]    Culture   Final    NO GROWTH 3 DAYS Performed at [facility], [place] [address] [place], [place] [address]    Report Status PENDING  Incomplete  Urine culture     Status: Abnormal   Collection Time: 01/27/19  9:34 AM  Result Value Ref Range Status   Specimen Description   Final    URINE, CLEAN CATCH Performed at [facility], [place] [address] [place], [place] [address]    Special Requests   Final    NONE Performed at [facility], [place] [address], [place], [place] [address]    Culture 40,000 COLONIES/mL YEAST (A)  Final   Report Status 01/28/2019 FINAL  Final  Blood Culture (routine x 2)     Status: None  (Preliminary result)   Collection Time: 01/27/19  9:35 AM  Result Value Ref Range Status   Specimen Description   Final    PORTA CATH Performed at [place] [address], [place], [place] [address]    Special Requests   Final    BOTTLES DRAWN AEROBIC AND ANAEROBIC Blood Culture adequate volume Performed at [place] [address] [place], [place] [address]    Culture   Final    NO GROWTH 3 DAYS Performed at [facility], [place] [address], [place], [place] [address]    Report Status PENDING  Incomplete  MRSA PCR Screening     Status: None   Collection Time: 01/27/19  1:30 PM  Result Value Ref Range Status   MRSA by PCR NEGATIVE NEGATIVE Final    Comment:        The GeneXpert MRSA Assay (FDA approved for NASAL specimens only), is one component of a comprehensive MRSA colonization surveillance program. It is not intended to diagnose MRSA infection nor to guide or monitor treatment for MRSA infections. Performed at [facility], [place] [address] [place],  [address]      Labs: BNP (last 3 results) No results for input(s): BNP in the last 8760 hours. Basic Metabolic Panel: Recent Labs  Lab 01/27/19 0850 01/28/19 0200 01/29/19 0449 01/30/19 0500  NA 135 139 141 142  K 4.9 4.6 4.2 4.0  CL 102 109 111 114*  CO2 24 20* 21* 19*  GLUCOSE 133* 80 74 86  BUN 47* 44* 38* 32*  CREATININE 3.72* 2.89* 2.30* 1.71*  CALCIUM 8.1* 7.6* 7.8* 7.9*  MG  --   --  2.3  --    Liver Function Tests: Recent Labs  Lab 01/27/19 0850  AST 21  ALT 9  ALKPHOS 132*  BILITOT 0.6  PROT 7.2  ALBUMIN 2.1*   No results for input(s): LIPASE, AMYLASE in the last 168 hours. No results for input(s): AMMONIA in the last 168 hours. CBC: Recent Labs  Lab 01/27/19 0850  01/27/19 1942 01/28/19 0200 01/28/19 1430 01/29/19 0449 01/30/19 0500  WBC 25.0*  --   --  23.1*  --  21.2* 19.5*  NEUTROABS 21.8*  --   --   --    --   --   --   HGB 5.0*   < > 7.2* 6.6* 8.0* 8.1* 8.1*  HCT 17.6*   < > 23.2* 21.8* 25.8* 26.2* 27.1*  MCV 105.4*  --   --  98.6  --  96.0 97.1  PLT 605*  --   --  519*  --  506* 476*   < > = values in this interval not displayed.   Cardiac Enzymes: No results for input(s): CKTOTAL, CKMB, CKMBINDEX, TROPONINI in the last 168 hours. BNP: Invalid input(s): POCBNP CBG: No results for input(s): GLUCAP in the last 168 hours. D-Dimer No results for input(s): DDIMER in the last 72 hours. Hgb A1c No results for input(s): HGBA1C in the last 72 hours. Lipid Profile No results for input(s): CHOL, HDL, LDLCALC, TRIG, CHOLHDL, LDLDIRECT in the last 72 hours. Thyroid function studies No results for input(s): TSH, T4TOTAL, T3FREE, THYROIDAB in the last 72 hours.  Invalid input(s): FREET3 Anemia work up No results for input(s): VITAMINB12, FOLATE, FERRITIN, TIBC, IRON, RETICCTPCT in the last 72 hours. Urinalysis    Component Value Date/Time   COLORURINE AMBER (A) 01/27/2019 0930   APPEARANCEUR CLOUDY (A) 01/27/2019 0930   LABSPEC 1.017 01/27/2019 0930   PHURINE 8.0 01/27/2019 0930   GLUCOSEU NEGATIVE 01/27/2019 0930   HGBUR MODERATE (A) 01/27/2019 0930   BILIRUBINUR NEGATIVE 01/27/2019 0930   KETONESUR NEGATIVE 01/27/2019 0930   PROTEINUR >=300 (A) 01/27/2019 0930   UROBILINOGEN 0.2 01/04/2017 1028   NITRITE NEGATIVE 01/27/2019 0930   LEUKOCYTESUR LARGE (A) 01/27/2019 0930   Sepsis Labs Invalid input(s): PROCALCITONIN,  WBC,  LACTICIDVEN Microbiology Recent Results (from the past 240 hour(s))  Blood Culture (routine x 2)     Status: None (Preliminary result)   Collection Time: 01/27/19  9:30 AM  Result Value Ref Range Status   Specimen Description   Final    BLOOD RIGHT ARM Performed at [facility], [place] [address], [place], [place] [address]    Special Requests   Final    BOTTLES DRAWN AEROBIC AND ANAEROBIC Blood Culture results may not be optimal due to an excessive  volume of blood received in culture bottles Performed at [place] [address], [place], [place] [address]    Culture   Final    NO GROWTH 3 DAYS Performed at [facility], [place] [address] [place],  [address]    Report Status PENDING  Incomplete  Urine culture     Status: Abnormal   Collection Time: 01/27/19  9:34 AM  Result Value Ref Range Status  Specimen Description   Final    URINE, CLEAN CATCH Performed at [facility], [place] [address] [place], [place] [address]    Special Requests   Final    NONE Performed at [facility], [place] [address] [place], [place] [address]    Culture 40,000 COLONIES/mL YEAST (A)  Final   Report Status 01/28/2019 FINAL  Final  Blood Culture (routine x 2)     Status: None (Preliminary result)   Collection Time: 01/27/19  9:35 AM  Result Value Ref Range Status   Specimen Description   Final    PORTA CATH Performed at [place] [address], [place], [place] [address]    Special Requests   Final    BOTTLES DRAWN AEROBIC AND ANAEROBIC Blood Culture adequate volume Performed at [place] [address] [place], [place] [address]    Culture   Final    NO GROWTH 3 DAYS Performed at [facility], [place] [address] [place], [place] [address]    Report Status PENDING  Incomplete  MRSA PCR Screening     Status: None   Collection Time: 01/27/19  1:30 PM  Result Value Ref Range Status   MRSA by PCR NEGATIVE NEGATIVE Final    Comment:        The GeneXpert MRSA Assay (FDA approved for NASAL specimens only), is one component of a comprehensive MRSA colonization surveillance program. It is not intended to diagnose MRSA infection nor to guide or monitor treatment for MRSA infections. Performed at [facility], [place] [address], [place], [place] [address]      Time coordinating discharge:  54  SIGNED:   Cristy Folks, MD  [facility] 01/30/2019, 11:59 AM   If 7PM-7AM, please contact night-coverage [email or web address] Password [email or web address]

## 2019-01-30 NOTE — Progress Notes (Signed)
Manufacturing engineer [facility]) Hospice GIP RN Note  As previously documented : This is a related GIP admission, pt is self pay with hospice and [employer] will be her covering payor. Mrs. Lariccia has an [facility] hospice diagnosis of cervical cancer per Dr. Tomasa Hosteller. Pt spouse activated EMS after he noticed her nephrostomy tube was leaking. She is admitted with sepsis and nephrostomy tube dislodgement.  Spoke with bedside RN Maudie Mercury who stated that she just medicated patient for complaints of back pain and that patient is currently dosing peacefully. RN also stated that patient did not eat breakfast due to discomfort, that VSS with HR slightly tachycardia and that patient still desires to go home with hospice. Supported and updated patient spouse [name] via telephone who confirmed desire to go home with hospice and asked for an afternoon discharge to prepare for patient.     Spoke with Dr. Brigid Re about concerns of pain control at home on current methadone dosing : Patient is currently receiving 5mg  TID in hospital yet was 15mg  TID in the home, and patient was admitted to hospital with pain issues. Dr. Brigid Re will discharge patient on current hospital medications and has posted discharge summary in Epic for today. Made [facility] MD, CM, and RN aware of methadone situation and will address once back in the home.   V/S: 99.1 oral, 110/71, HR 117, RR 19, SPO2 99% RA  Lab work: (01/30/19) BUN 32, cr 1.71, Cal 7.9, WBC 19.5, RBC 2.79, hgb 8.1, HCT 27.1, MCV 97.1, MCHC 29.9, RDW 19.0  No new imaging  Medications:  Continuous IVF - NS at 31ml/hr PRNs on 01/30/19 Dilaudid 1mg  inj at 0354, 0552, 0957 Scheduled: Diflucan 400 mg IV QD,  Methadone 5mg  tab TID,   Problem List upon Discharge per Epic: #) Subjective fever/weakness/poor p.o. intake: She was initially given IV fluconazole and then discharged home to complete a total of 2 weeks of p.o. fluconazole #) Metastatic stage IV cervical cancer status post bilateral PCN  tubes: Patient was admitted with dislodgment of bilateral PCN tubes.  These were exchanged by interventional radiology.  Urology removed right ureteral stent on 01/28/2019.  Palliative care was again consulted and discussed with the family and despite being on home hospice patient wants to remain full code.  She was given prescriptions for home PRN hydromorphone orally however this prescriber could not prescribe methadone and home hospice will prescribe. 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  This was presumed to be largely post renal due to dislodged PCN tube but also possibly prerenal due to poor p.o. intake.  Patient was given IV fluids and PCN tubes were placed.  AKI improved to baseline. #) Acute on chronic blood loss anemia: Patient received 2 units packed red blood cells.  Her hemoglobin was stable on discharge. 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  Due to her chronic anemia, poor prognosis chronic chronic blood loss she was not on any treatment for this.  She was not sent home on any treatment for this. #) HIV: Patient was continued on home Biktarvy. #) Diet-controlled diabetes: Patient was given a regular diet as strict blood sugar control is no longer clinically indicated. #) Pain/psych: Patient was continued on scheduled methadone, mirtazapine nightly next, PRN oral hydromorphone, PRN clonazepam.  IDT: Updated [facility] team on patient status. [facility] CM aware of above and has spoken with [facility] CM about patient.   Discharge Summary posted in Epic and patient to be  discharged home with support of hospital this afternoon. Patient spouse aware. [facility] CM to call [facility] after 2pm to tranport home. Referral Center made aware of discharge Plans. GOC ongoing at this time.   Please call with any hospice related questions/concerns.  Gar Ponto, RN  [facility] Liaison (on [place]) [phone]

## 2019-01-30 NOTE — Discharge Instructions (Signed)
Fluconazole tablets °What is this medicine? °FLUCONAZOLE (floo KON na zole) is an antifungal medicine. It is used to treat certain kinds of fungal or yeast infections. °This medicine may be used for other purposes; ask your health care provider or pharmacist if you have questions. °COMMON BRAND NAME(S): Diflucan °What should I tell my health care provider before I take this medicine? °They need to know if you have any of these conditions: °-history of irregular heart beat °-kidney disease °-an unusual or allergic reaction to fluconazole, other azole antifungals, medicines, foods, dyes, or preservatives °-pregnant or trying to get pregnant °-breast-feeding °How should I use this medicine? °Take this medicine by mouth. Follow the directions on the prescription label. Do not take your medicine more often than directed. °Talk to your pediatrician regarding the use of this medicine in children. Special care may be needed. This medicine has been used in children as young as 6 months of age. °Overdosage: If you think you have taken too much of this medicine contact a poison control center or emergency room at once. °NOTE: This medicine is only for you. Do not share this medicine with others. °What if I miss a dose? °If you miss a dose, take it as soon as you can. If it is almost time for your next dose, take only that dose. Do not take double or extra doses. °What may interact with this medicine? °Do not take this medicine with any of the following medications: °-astemizole °-certain medicines for irregular heart beat like dofetilide, dronedarone, quinidine °-cisapride °-erythromycin °-lomitapide °-other medicines that prolong the QT interval (cause an abnormal heart rhythm) °-pimozide °-terfenadine °-thioridazine °-tolvaptan °-ziprasidone °This medicine may also interact with the following medications: °-antiviral medicines for HIV or AIDS °-birth control pills °-certain antibiotics like rifabutin, rifampin °-certain  medicines for blood pressure like amlodipine, isradipine, felodipine, hydrochlorothiazide, losartan, nifedipine °-certain medicines for cancer like cyclophosphamide, vinblastine, vincristine °-certain medicines for cholesterol like atorvastatin, lovastatin, fluvastatin, simvastatin °-certain medicines for depression, anxiety, or psychotic disturbances like amitriptyline, midazolam, nortriptyline, triazolam °-certain medicines for diabetes like glipizide, glyburide, tolbutamide °-certain medicines for pain like alfentanil, fentanyl, methadone °-certain medicines for seizures like carbamazepine, phenytoin °-certain medicines that treat or prevent blood clots like warfarin °-halofantrine °-medicines that lower your chance of fighting infection like cyclosporine, prednisone, tacrolimus °-NSAIDS, medicines for pain and inflammation, like celecoxib, diclofenac, flurbiprofen, ibuprofen, meloxicam, naproxen °-other medicines for fungal infections °-sirolimus °-theophylline °-tofacitinib °This list may not describe all possible interactions. Give your health care provider a list of all the medicines, herbs, non-prescription drugs, or dietary supplements you use. Also tell them if you smoke, drink alcohol, or use illegal drugs. Some items may interact with your medicine. °What should I watch for while using this medicine? °Visit your doctor or health care professional for regular checkups. If you are taking this medicine for a long time you may need blood work. Tell your doctor if your symptoms do not improve. Some fungal infections need many weeks or months of treatment to cure. °Alcohol can increase possible damage to your liver. Avoid alcoholic drinks. °If you have a vaginal infection, do not have sex until you have finished your treatment. You can wear a sanitary napkin. Do not use tampons. Wear freshly washed cotton, not synthetic, panties. °What side effects may I notice from receiving this medicine? °Side effects that  you should report to your doctor or health care professional as soon as possible: °-allergic reactions like skin rash or itching, hives, swelling of the   lips, mouth, tongue, or throat -dark urine -feeling dizzy or faint -irregular heartbeat or chest pain -redness, blistering, peeling or loosening of the skin, including inside the mouth -trouble breathing -unusual bruising or bleeding -vomiting -yellowing of the eyes or skin Side effects that usually do not require medical attention (report to your doctor or health care professional if they continue or are bothersome): -changes in how food tastes -diarrhea -headache -stomach upset or nausea This list may not describe all possible side effects. Call your doctor for medical advice about side effects. You may report side effects to FDA at 1-800-FDA-1088. Where should I keep my medicine? Keep out of the reach of children. Store at room temperature below 30 degrees C (86 degrees F). Throw away any medicine after the expiration date. NOTE: This sheet is a summary. It may not cover all possible information. If you have questions about this medicine, talk to your doctor, pharmacist, or health care provider.  2019 Elsevier/Gold Standard (2013-06-01 19:37:38)

## 2019-01-30 NOTE — TOC Transition Note (Signed)
Transition of Care [facility]) - CM/SW Discharge Note   Patient Details  Name: Misty Thomas MRN: [id number] Date of Birth: May 13, 1966  [facility]) CM/SW Contact:  Uriah Trueba, Marjie Skiff, RN Phone Number: 01/30/2019, 1:41 PM  Medical Necessity Form filled out and printed for staff RN Kim to call [facility] when pt ready to dc home with hospice.  Marney Doctor RN,BSN [phone]

## 2019-01-30 NOTE — Progress Notes (Signed)
Discharge instructions reviewed with patient. Called and notified patients husband [name] that patient is ready for discharge just waiting on EMS. Discharge medications discussed with husband

## 2019-02-01 LAB — CULTURE, BLOOD (ROUTINE X 2)
Culture: NO GROWTH
Culture: NO GROWTH
Special Requests: ADEQUATE

## 2019-02-04 ENCOUNTER — Other Ambulatory Visit (HOSPITAL_COMMUNITY): Payer: BLUE CROSS/BLUE SHIELD

## 2019-02-06 MED FILL — BIKTARVY 50-200-25 MG TABS: 50-200-25 | 30 days supply | Qty: 30 | Fill #4

## 2019-02-21 ENCOUNTER — Encounter: Payer: Self-pay | Admitting: Hematology and Oncology

## 2019-03-06 ENCOUNTER — Other Ambulatory Visit: Payer: Self-pay | Admitting: Radiology

## 2019-03-07 ENCOUNTER — Ambulatory Visit (HOSPITAL_COMMUNITY): Payer: BLUE CROSS/BLUE SHIELD

## 2019-03-07 ENCOUNTER — Telehealth: Payer: Self-pay

## 2019-03-07 ENCOUNTER — Inpatient Hospital Stay (HOSPITAL_COMMUNITY): Admit: 2019-03-07 | Payer: BLUE CROSS/BLUE SHIELD

## 2019-03-07 NOTE — Telephone Encounter (Signed)
Nurse with Hospice called and left asking for re certification to continue care.  Called back and given verbal order from Dr. Alvy Bimler.

## 2019-03-08 MED FILL — BIKTARVY 50-200-25 MG TABS: 50-200-25 | 30 days supply | Qty: 30 | Fill #5

## 2019-03-15 ENCOUNTER — Other Ambulatory Visit: Payer: Self-pay | Admitting: Student

## 2019-03-18 ENCOUNTER — Inpatient Hospital Stay (HOSPITAL_COMMUNITY): Admission: RE | Admit: 2019-03-18 | Source: Ambulatory Visit

## 2019-03-18 ENCOUNTER — Ambulatory Visit (HOSPITAL_COMMUNITY): Payer: BLUE CROSS/BLUE SHIELD

## 2019-04-08 DEATH — deceased

## 2020-05-01 IMAGING — PT NM PET TUM IMG RESTAG (PS) SKULL BASE T - THIGH
8 series · 25 of 25 positions shown · non-contrast
Comparison: 07/11/2018

CLINICAL DATA: Subsequent treatment strategy for cervical cancer
with periaortic lymph node metastasis. Radiation therapy in [REDACTED].

EXAM:
NUCLEAR MEDICINE PET SKULL BASE TO THIGH
TECHNIQUE: 10.8 mCi F-18 FDG was injected intravenously. Full-ring PET imaging
was performed from the skull base to thigh after the radiotracer. CT
data was obtained and used for attenuation correction and anatomic
localization.
Fasting blood glucose: 81 mg/dl

[Series 3: pet sk_thigh ac · axial · 5.0mm · 4.07mm/px · z∈[-1466,-622]mm · 4 of 212 slices shown]
[im 1/212]
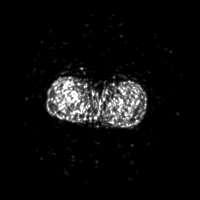
[im 71/212]
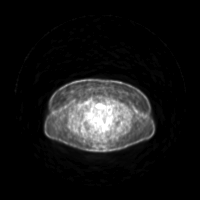
[im 141/212]
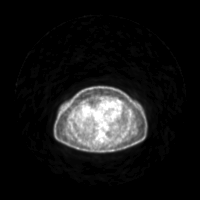
[im 212/212]
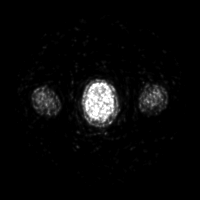

[Series 4: ct sk_thigh 5.0 b31f · axial · 5.0mm · 0.98mm/px · z∈[-1466,-622]mm · 5 of 212 slices shown]
[im 1/212]
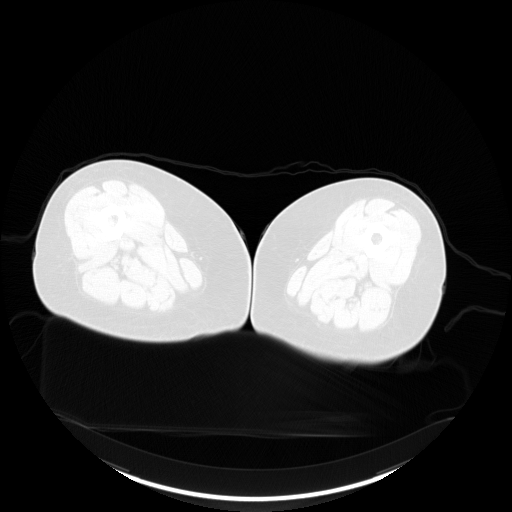
[im 53/212  brain]
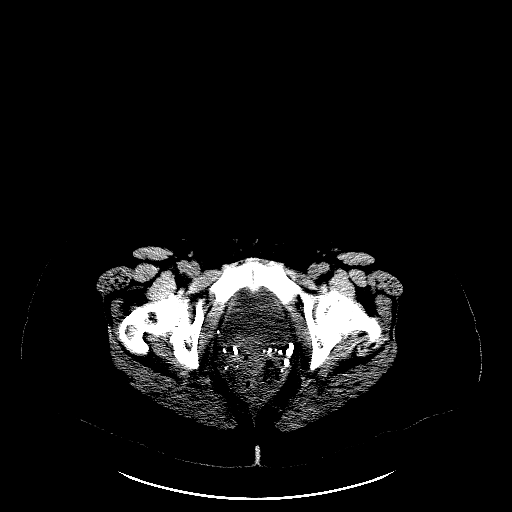
[im 106/212  brain]
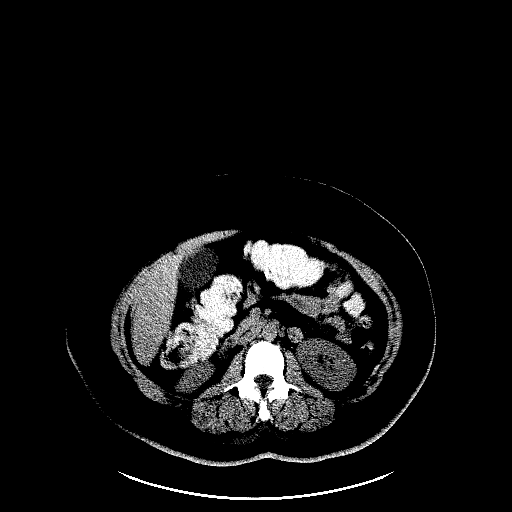
[im 159/212]
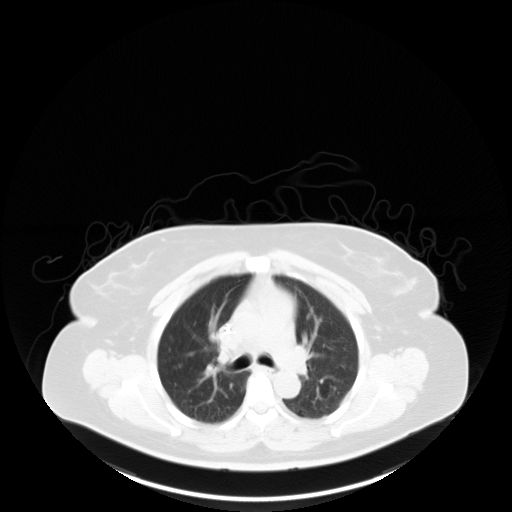
[im 212/212  brain]
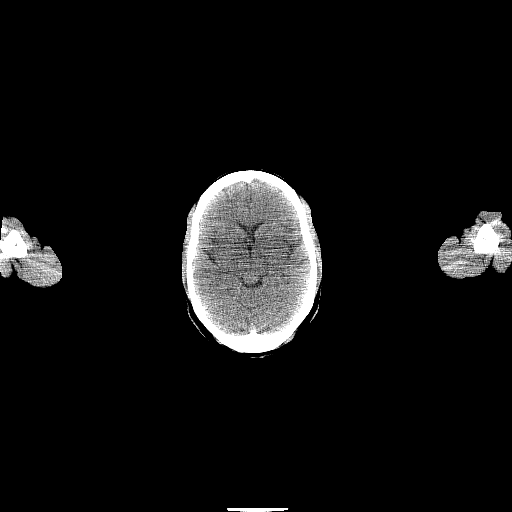

[Series 5: pet sk_thigh nac · axial · 5.0mm · 4.07mm/px · z∈[-1466,-622]mm · 5 of 212 slices shown]
[im 1/212]
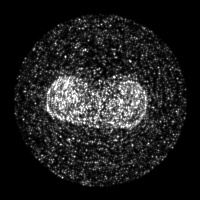
[im 53/212]
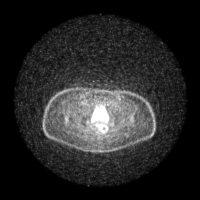
[im 106/212]
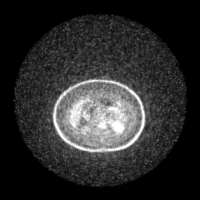
[im 159/212]
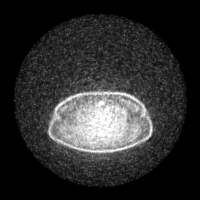
[im 212/212]
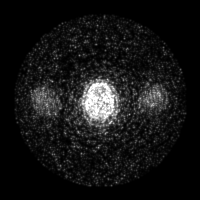

[Series 8: ct sk_thigh 5.0 b70f (id)_bone · axial · 5.0mm · 0.63mm/px · z∈[-992,-748]mm · 2 of 62 slices shown]
[im 1/62  bone]
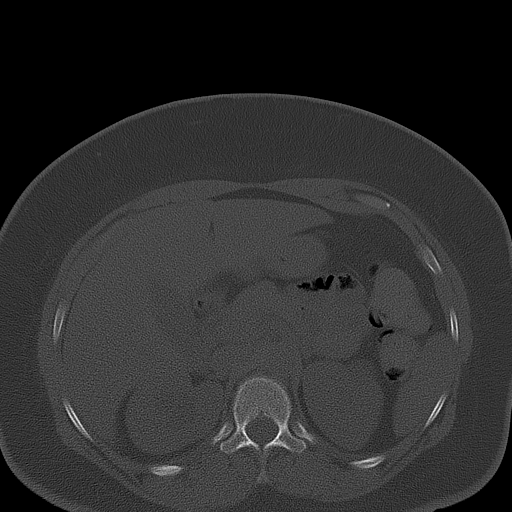
[im 62/62  bone]
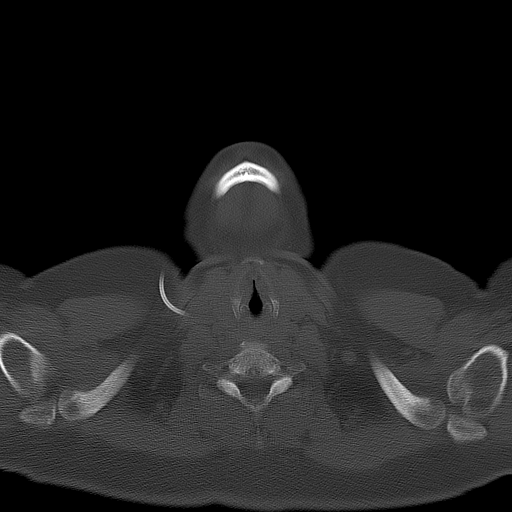

[Series 603: range-ct sk_thigh 5.0 (id)<alpha range> · 2 of 67 slices shown (1 of 2)]
[im 1/67]
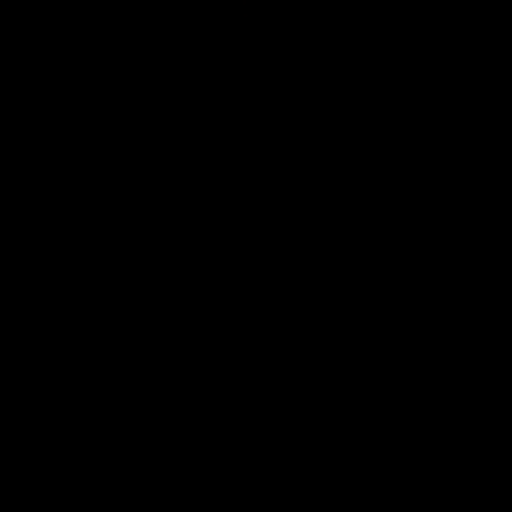
[im 67/67]
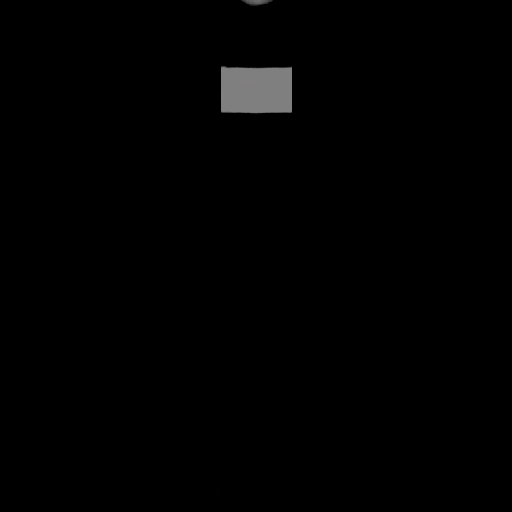

[Series 604: mip range · coronal · 1.75mm/px · 1 of 32 slices shown]
[im 1/32]
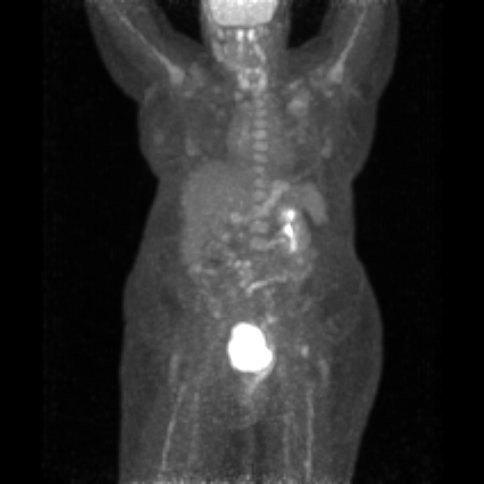

[Series 606: range-ct sk_thigh 5.0 (id)<alpha range> · 5 of 206 slices shown (2 of 2)]
[im 1/206]
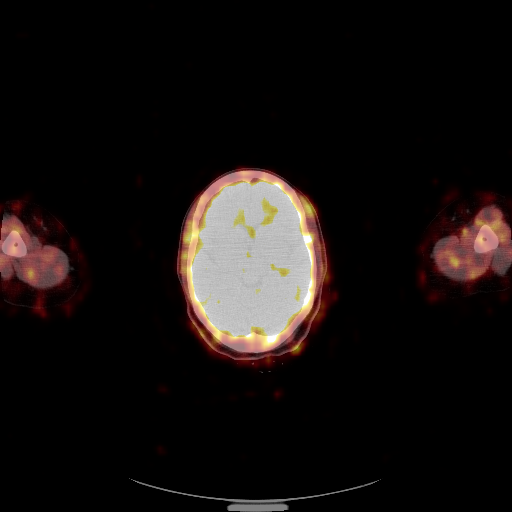
[im 52/206]
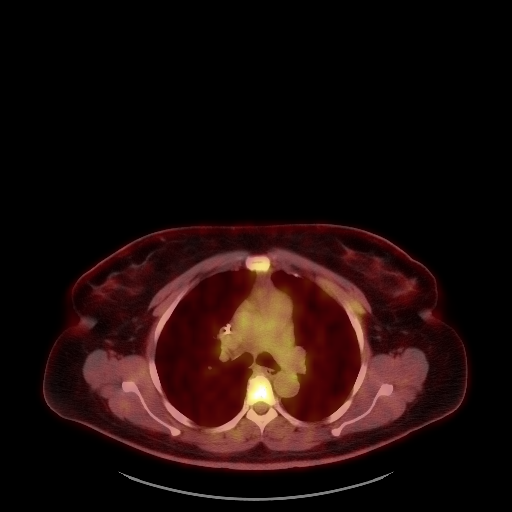
[im 103/206]
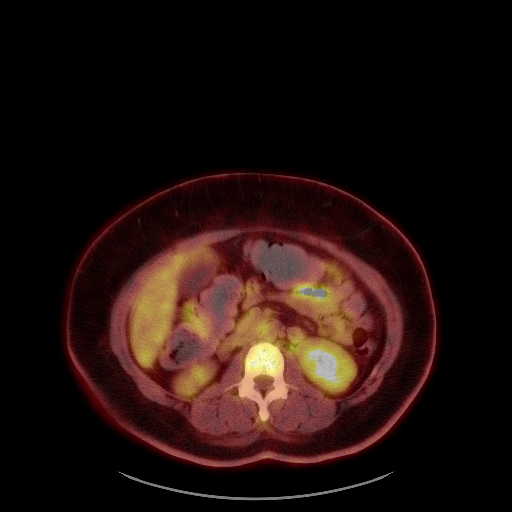
[im 154/206]
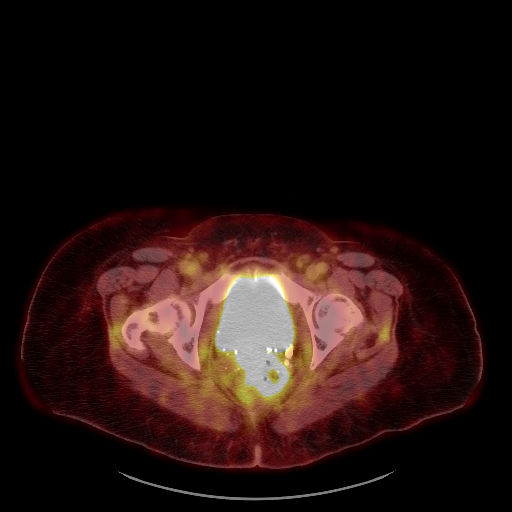
[im 206/206]
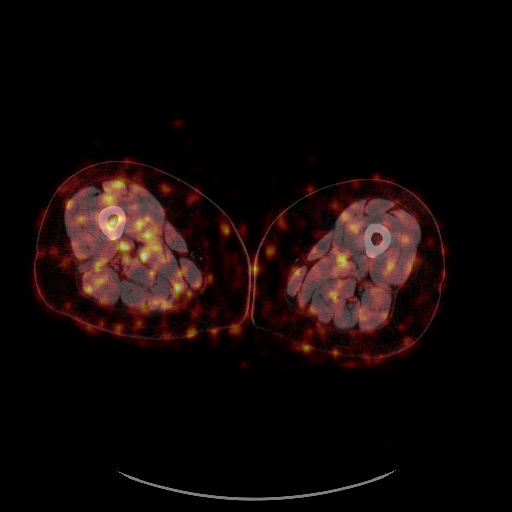

[Series 1388: results mm oncology reading · 3.0mm · 1.10mm/px · 1 of 9 slices shown]
[im 1/9]
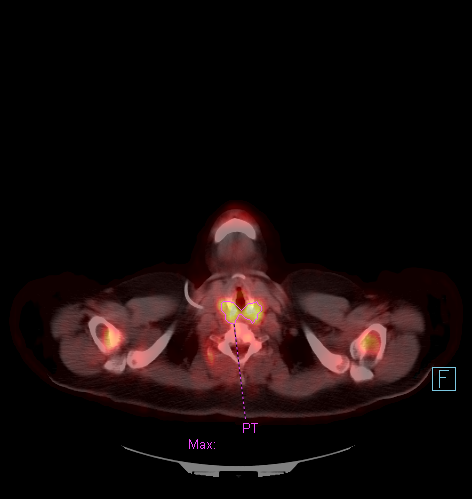

[25 of 25 positions shown; findings below may reference images not displayed]

FINDINGS: Mild to moderate degradation secondary to patient body habitus.

Mediastinal blood pool activity: SUV max

NECK: Muscular activity within the neck. Diffuse thyroid
hypermetabolism again identified, including at a S.U.V. max of 10.3.
No cervical nodal hypermetabolism.

Incidental CT findings: No cervical adenopathy.

CHEST: No thoracic nodal hypermetabolism. No pulmonary parenchymal
hypermetabolism.

Incidental CT findings: Right Port-A-Cath tip at high right atrium.
Tiny hiatal hernia.

ABDOMEN/PELVIS: Foci of retrocaval hypermetabolism may be due to
small nodes. Examples including at a S.U.V. max of 9.4 on image 116.

Hypermetabolism along the left common iliac chain measures a S.U.V.
max of 5.9 on image 134/4 versus a S.U.V. max of 3.1 at the same
level on the prior exam (when remeasured).

Left perirectal hypermetabolism and soft tissue fullness/gas are
difficult to differentiate from the adjacent rectum today. Felt to
measure on the order of 3.2 cm and a S.U.V. max of 12.6 on image
160/4. Compare 3.0 cm and a S.U.V. max of 5.6 on the prior.

Hypermetabolism within the lower uterine segment and cervical
region. This measures a S.U.V. max of 10.9 today, and demonstrates
central gas on image 153/4. Compare a S.U.V. max of 4.7 on the
prior.

Incidental CT findings: Mild-to-moderate hydronephrosis, similar to
minimally increased. Low-density left renal lesion is likely a cyst.
Large colonic stool burden.

SKELETON: Diffuse marrow hypermetabolism could be related to
stimulation by chemotherapy.

Incidental CT findings: none
IMPRESSION: 1. Degradation secondary to patient body habitus.
2. Disease progression, as evidenced by increase in hypermetabolism
within the cervix and a perirectal area of soft tissue density and
gas. Progressive left common iliac and new retroperitoneal abdominal
hypermetabolism, suspicious for nodal metastasis.
3. Gas within the cervical region could be related to necrosis from
radiation therapy. Fistulous communication to bowel could look
similar.
4. Similar to mild increase in moderate right-sided hydronephrosis.
5. Thyroid hypermetabolism, again suggesting thyroiditis.
